# Patient Record
Sex: Male | Born: 1960 | Race: Black or African American | Hispanic: No | Marital: Married | Smoking: Never smoker
Health system: Southern US, Community
[De-identification: ages and names within clinical notes are randomized; demographics above are authoritative.]

## PROBLEM LIST (undated history)

## (undated) DIAGNOSIS — I10 Essential (primary) hypertension: Secondary | ICD-10-CM

## (undated) DIAGNOSIS — Z923 Personal history of irradiation: Secondary | ICD-10-CM

---

## 2003-01-19 ENCOUNTER — Encounter: Payer: Self-pay | Admitting: Family Medicine

## 2003-01-19 ENCOUNTER — Ambulatory Visit (HOSPITAL_COMMUNITY): Admission: RE | Admit: 2003-01-19 | Discharge: 2003-01-19 | Payer: Self-pay | Admitting: Family Medicine

## 2004-03-30 ENCOUNTER — Emergency Department (HOSPITAL_COMMUNITY): Admission: EM | Admit: 2004-03-30 | Discharge: 2004-03-30 | Payer: Self-pay | Admitting: Emergency Medicine

## 2004-03-30 ENCOUNTER — Ambulatory Visit (HOSPITAL_BASED_OUTPATIENT_CLINIC_OR_DEPARTMENT_OTHER): Admission: RE | Admit: 2004-03-30 | Discharge: 2004-03-30 | Payer: Self-pay | Admitting: Orthopedic Surgery

## 2004-03-30 ENCOUNTER — Ambulatory Visit (HOSPITAL_COMMUNITY): Admission: RE | Admit: 2004-03-30 | Discharge: 2004-03-30 | Payer: Self-pay | Admitting: Orthopedic Surgery

## 2007-05-08 ENCOUNTER — Inpatient Hospital Stay (HOSPITAL_COMMUNITY): Admission: EM | Admit: 2007-05-08 | Discharge: 2007-05-11 | Payer: Self-pay | Admitting: *Deleted

## 2007-05-08 ENCOUNTER — Ambulatory Visit: Payer: Self-pay | Admitting: Cardiology

## 2007-05-08 ENCOUNTER — Encounter (INDEPENDENT_AMBULATORY_CARE_PROVIDER_SITE_OTHER): Payer: Self-pay | Admitting: Internal Medicine

## 2007-05-12 ENCOUNTER — Ambulatory Visit: Payer: Self-pay | Admitting: Internal Medicine

## 2007-05-25 ENCOUNTER — Ambulatory Visit (HOSPITAL_COMMUNITY): Admission: RE | Admit: 2007-05-25 | Discharge: 2007-05-25 | Payer: Self-pay | Admitting: Internal Medicine

## 2007-05-25 LAB — CBC WITH DIFFERENTIAL/PLATELET
Eosinophils Absolute: 0 10*3/uL (ref 0.0–0.5)
HGB: 9.4 g/dL — ABNORMAL LOW (ref 13.0–17.1)
MCV: 89.9 fL (ref 81.6–98.0)
MONO#: 0.5 10*3/uL (ref 0.1–0.9)
MONO%: 13.6 % — ABNORMAL HIGH (ref 0.0–13.0)
NEUT#: 2.3 10*3/uL (ref 1.5–6.5)
RBC: 2.95 10*6/uL — ABNORMAL LOW (ref 4.20–5.71)
RDW: 15.5 % — ABNORMAL HIGH (ref 11.2–14.6)
WBC: 3.9 10*3/uL — ABNORMAL LOW (ref 4.0–10.0)
lymph#: 1.1 10*3/uL (ref 0.9–3.3)

## 2007-05-29 LAB — COMPREHENSIVE METABOLIC PANEL
Albumin: 2.9 g/dL — ABNORMAL LOW (ref 3.5–5.2)
Alkaline Phosphatase: 19 U/L — ABNORMAL LOW (ref 39–117)
Calcium: 9 mg/dL (ref 8.4–10.5)
Chloride: 103 mEq/L (ref 96–112)
Glucose, Bld: 80 mg/dL (ref 70–99)
Potassium: 3.7 mEq/L (ref 3.5–5.3)
Sodium: 132 mEq/L — ABNORMAL LOW (ref 135–145)
Total Protein: 13.6 g/dL — ABNORMAL HIGH (ref 6.0–8.3)

## 2007-05-29 LAB — UIFE/LIGHT CHAINS/TP QN, 24-HR UR
Albumin, U: DETECTED
Alpha 1, Urine: DETECTED — AB
Beta, Urine: DETECTED — AB
Free Lambda Lt Chains,Ur: 0.12 mg/dL (ref 0.08–1.01)
Gamma Globulin, Urine: DETECTED — AB
Volume, Urine: 1375 mL

## 2007-05-29 LAB — SPEP & IFE WITH QIG
Alpha-2-Globulin: 6 % — ABNORMAL LOW (ref 7.1–11.8)
Gamma Globulin: 53.9 % — ABNORMAL HIGH (ref 11.1–18.8)
IgG (Immunoglobin G), Serum: 5900 mg/dL — ABNORMAL HIGH (ref 694–1618)
M-Spike, %: 4.54 g/dL

## 2007-05-29 LAB — KAPPA/LAMBDA LIGHT CHAINS

## 2007-05-29 LAB — BETA 2 MICROGLOBULIN, SERUM: Beta-2 Microglobulin: 5.28 mg/L — ABNORMAL HIGH (ref 1.01–1.73)

## 2007-06-19 ENCOUNTER — Ambulatory Visit (HOSPITAL_COMMUNITY): Admission: RE | Admit: 2007-06-19 | Discharge: 2007-06-19 | Payer: Self-pay | Admitting: Internal Medicine

## 2007-06-19 ENCOUNTER — Encounter (INDEPENDENT_AMBULATORY_CARE_PROVIDER_SITE_OTHER): Payer: Self-pay | Admitting: Interventional Radiology

## 2007-06-21 ENCOUNTER — Emergency Department (HOSPITAL_COMMUNITY): Admission: EM | Admit: 2007-06-21 | Discharge: 2007-06-21 | Payer: Self-pay | Admitting: Emergency Medicine

## 2007-06-22 ENCOUNTER — Ambulatory Visit: Payer: Self-pay | Admitting: Internal Medicine

## 2007-06-24 ENCOUNTER — Encounter (HOSPITAL_COMMUNITY): Admission: RE | Admit: 2007-06-24 | Discharge: 2007-08-17 | Payer: Self-pay | Admitting: Internal Medicine

## 2007-06-24 LAB — CBC WITH DIFFERENTIAL/PLATELET
Basophils Absolute: 0 10*3/uL (ref 0.0–0.1)
Eosinophils Absolute: 0 10*3/uL (ref 0.0–0.5)
HCT: 21.6 % — ABNORMAL LOW (ref 38.7–49.9)
HGB: 7.7 g/dL — ABNORMAL LOW (ref 13.0–17.1)
LYMPH%: 20.9 % (ref 14.0–48.0)
MONO#: 0.9 10*3/uL (ref 0.1–0.9)
NEUT#: 5.5 10*3/uL (ref 1.5–6.5)
NEUT%: 67.1 % (ref 40.0–75.0)
Platelets: 193 10*3/uL (ref 145–400)
WBC: 8.2 10*3/uL (ref 4.0–10.0)

## 2007-06-24 LAB — COMPREHENSIVE METABOLIC PANEL
CO2: 21 mEq/L (ref 19–32)
Creatinine, Ser: 0.69 mg/dL (ref 0.40–1.50)
Glucose, Bld: 88 mg/dL (ref 70–99)
Total Bilirubin: 0.5 mg/dL (ref 0.3–1.2)
Total Protein: 12.2 g/dL — ABNORMAL HIGH (ref 6.0–8.3)

## 2007-06-26 LAB — TYPE & CROSSMATCH - CHCC

## 2007-06-30 ENCOUNTER — Ambulatory Visit: Admission: RE | Admit: 2007-06-30 | Discharge: 2007-08-13 | Payer: Self-pay | Admitting: Radiation Oncology

## 2007-07-01 LAB — BASIC METABOLIC PANEL
BUN: 23 mg/dL (ref 6–23)
CO2: 21 mEq/L (ref 19–32)
Chloride: 100 mEq/L (ref 96–112)
Creatinine, Ser: 1.1 mg/dL (ref 0.40–1.50)

## 2007-07-27 LAB — CBC WITH DIFFERENTIAL/PLATELET
Basophils Absolute: 0 10*3/uL (ref 0.0–0.1)
EOS%: 6 % (ref 0.0–7.0)
HGB: 8 g/dL — ABNORMAL LOW (ref 13.0–17.1)
MCH: 32.9 pg (ref 28.0–33.4)
MCV: 92.3 fL (ref 81.6–98.0)
MONO%: 12.5 % (ref 0.0–13.0)
RDW: 15.8 % — ABNORMAL HIGH (ref 11.2–14.6)

## 2007-07-27 LAB — COMPREHENSIVE METABOLIC PANEL
AST: 10 U/L (ref 0–37)
Albumin: 2.9 g/dL — ABNORMAL LOW (ref 3.5–5.2)
Alkaline Phosphatase: 33 U/L — ABNORMAL LOW (ref 39–117)
BUN: 16 mg/dL (ref 6–23)
Creatinine, Ser: 0.69 mg/dL (ref 0.40–1.50)
Potassium: 2.8 mEq/L — ABNORMAL LOW (ref 3.5–5.3)
Total Bilirubin: 0.5 mg/dL (ref 0.3–1.2)

## 2007-08-10 ENCOUNTER — Ambulatory Visit: Payer: Self-pay | Admitting: Internal Medicine

## 2007-08-12 LAB — CBC WITH DIFFERENTIAL/PLATELET
BASO%: 0.3 % (ref 0.0–2.0)
LYMPH%: 15.1 % (ref 14.0–48.0)
MCH: 32.7 pg (ref 28.0–33.4)
MCHC: 35.9 g/dL (ref 32.0–35.9)
MCV: 91.1 fL (ref 81.6–98.0)
MONO%: 9.3 % (ref 0.0–13.0)
Platelets: 185 10*3/uL (ref 145–400)
RBC: 3.06 10*6/uL — ABNORMAL LOW (ref 4.20–5.71)

## 2007-08-12 LAB — COMPREHENSIVE METABOLIC PANEL
ALT: 9 U/L (ref 0–53)
Alkaline Phosphatase: 44 U/L (ref 39–117)
Sodium: 134 mEq/L — ABNORMAL LOW (ref 135–145)
Total Bilirubin: 0.5 mg/dL (ref 0.3–1.2)
Total Protein: 9.3 g/dL — ABNORMAL HIGH (ref 6.0–8.3)

## 2007-08-19 LAB — COMPREHENSIVE METABOLIC PANEL
ALT: 9 U/L (ref 0–53)
BUN: 9 mg/dL (ref 6–23)
CO2: 26 mEq/L (ref 19–32)
Calcium: 8.5 mg/dL (ref 8.4–10.5)
Creatinine, Ser: 0.72 mg/dL (ref 0.40–1.50)
Glucose, Bld: 76 mg/dL (ref 70–99)
Total Bilirubin: 0.8 mg/dL (ref 0.3–1.2)

## 2007-08-19 LAB — CBC WITH DIFFERENTIAL/PLATELET
BASO%: 0.1 % (ref 0.0–2.0)
Basophils Absolute: 0 10*3/uL (ref 0.0–0.1)
HCT: 27 % — ABNORMAL LOW (ref 38.7–49.9)
HGB: 9.8 g/dL — ABNORMAL LOW (ref 13.0–17.1)
LYMPH%: 12 % — ABNORMAL LOW (ref 14.0–48.0)
MCH: 33.6 pg — ABNORMAL HIGH (ref 28.0–33.4)
MCHC: 36.4 g/dL — ABNORMAL HIGH (ref 32.0–35.9)
MONO#: 0.5 10*3/uL (ref 0.1–0.9)
NEUT%: 65.5 % (ref 40.0–75.0)
Platelets: 218 10*3/uL (ref 145–400)
WBC: 3.5 10*3/uL — ABNORMAL LOW (ref 4.0–10.0)

## 2007-08-26 LAB — CBC WITH DIFFERENTIAL/PLATELET
BASO%: 2.7 % — ABNORMAL HIGH (ref 0.0–2.0)
EOS%: 6.9 % (ref 0.0–7.0)
Eosinophils Absolute: 0.2 10*3/uL (ref 0.0–0.5)
MCH: 32.7 pg (ref 28.0–33.4)
MCHC: 35.2 g/dL (ref 32.0–35.9)
MCV: 92.9 fL (ref 81.6–98.0)
MONO%: 23.8 % — ABNORMAL HIGH (ref 0.0–13.0)
NEUT#: 1.2 10*3/uL — ABNORMAL LOW (ref 1.5–6.5)
RBC: 3.31 10*6/uL — ABNORMAL LOW (ref 4.20–5.71)
RDW: 13.7 % (ref 11.2–14.6)

## 2007-08-26 LAB — COMPREHENSIVE METABOLIC PANEL
AST: 9 U/L (ref 0–37)
Albumin: 3.7 g/dL (ref 3.5–5.2)
BUN: 12 mg/dL (ref 6–23)
Calcium: 8.6 mg/dL (ref 8.4–10.5)
Chloride: 101 mEq/L (ref 96–112)
Potassium: 3.6 mEq/L (ref 3.5–5.3)

## 2007-09-09 LAB — CBC WITH DIFFERENTIAL/PLATELET
Basophils Absolute: 0 10*3/uL (ref 0.0–0.1)
Eosinophils Absolute: 0 10*3/uL (ref 0.0–0.5)
HGB: 10.9 g/dL — ABNORMAL LOW (ref 13.0–17.1)
LYMPH%: 19.6 % (ref 14.0–48.0)
MONO#: 0.7 10*3/uL (ref 0.1–0.9)
NEUT#: 2.3 10*3/uL (ref 1.5–6.5)
Platelets: 279 10*3/uL (ref 145–400)
RBC: 3.27 10*6/uL — ABNORMAL LOW (ref 4.20–5.71)
WBC: 3.8 10*3/uL — ABNORMAL LOW (ref 4.0–10.0)

## 2007-09-09 LAB — COMPREHENSIVE METABOLIC PANEL
ALT: 11 U/L (ref 0–53)
CO2: 26 mEq/L (ref 19–32)
Calcium: 9.2 mg/dL (ref 8.4–10.5)
Chloride: 104 mEq/L (ref 96–112)
Glucose, Bld: 87 mg/dL (ref 70–99)
Sodium: 136 mEq/L (ref 135–145)
Total Protein: 9.4 g/dL — ABNORMAL HIGH (ref 6.0–8.3)

## 2007-09-09 LAB — LACTATE DEHYDROGENASE: LDH: 110 U/L (ref 94–250)

## 2007-09-17 LAB — CBC WITH DIFFERENTIAL/PLATELET
BASO%: 0 % (ref 0.0–2.0)
Eosinophils Absolute: 0 10*3/uL (ref 0.0–0.5)
MONO#: 0.6 10*3/uL (ref 0.1–0.9)
NEUT#: 4.8 10*3/uL (ref 1.5–6.5)
RBC: 3.45 10*6/uL — ABNORMAL LOW (ref 4.20–5.71)
RDW: 16.1 % — ABNORMAL HIGH (ref 11.2–14.6)
WBC: 6.2 10*3/uL (ref 4.0–10.0)
lymph#: 0.8 10*3/uL — ABNORMAL LOW (ref 0.9–3.3)

## 2007-09-21 LAB — COMPREHENSIVE METABOLIC PANEL
Albumin: 3.6 g/dL (ref 3.5–5.2)
Alkaline Phosphatase: 50 U/L (ref 39–117)
BUN: 14 mg/dL (ref 6–23)
CO2: 25 mEq/L (ref 19–32)
Calcium: 8.9 mg/dL (ref 8.4–10.5)
Chloride: 103 mEq/L (ref 96–112)
Glucose, Bld: 77 mg/dL (ref 70–99)
Potassium: 3.4 mEq/L — ABNORMAL LOW (ref 3.5–5.3)
Sodium: 138 mEq/L (ref 135–145)
Total Protein: 9.3 g/dL — ABNORMAL HIGH (ref 6.0–8.3)

## 2007-09-21 LAB — LACTATE DEHYDROGENASE: LDH: 108 U/L (ref 94–250)

## 2007-09-21 LAB — IGG, IGA, IGM: IgG (Immunoglobin G), Serum: 5020 mg/dL — ABNORMAL HIGH (ref 694–1618)

## 2007-09-21 LAB — KAPPA/LAMBDA LIGHT CHAINS: Kappa free light chain: 5.42 mg/dL — ABNORMAL HIGH (ref 0.33–1.94)

## 2007-09-24 LAB — COMPREHENSIVE METABOLIC PANEL
ALT: 11 U/L (ref 0–53)
AST: 11 U/L (ref 0–37)
Albumin: 3.8 g/dL (ref 3.5–5.2)
Calcium: 9.5 mg/dL (ref 8.4–10.5)
Chloride: 104 mEq/L (ref 96–112)
Creatinine, Ser: 0.54 mg/dL (ref 0.40–1.50)
Potassium: 4 mEq/L (ref 3.5–5.3)
Sodium: 134 mEq/L — ABNORMAL LOW (ref 135–145)

## 2007-09-24 LAB — CBC WITH DIFFERENTIAL/PLATELET
BASO%: 0.3 % (ref 0.0–2.0)
EOS%: 0 % (ref 0.0–7.0)
MCH: 33.5 pg — ABNORMAL HIGH (ref 28.0–33.4)
MCHC: 36.7 g/dL — ABNORMAL HIGH (ref 32.0–35.9)
RBC: 3.65 10*6/uL — ABNORMAL LOW (ref 4.20–5.71)
RDW: 12.8 % (ref 11.2–14.6)
lymph#: 0.5 10*3/uL — ABNORMAL LOW (ref 0.9–3.3)

## 2007-09-25 LAB — IGG, IGA, IGM
IgA: 40 mg/dL — ABNORMAL LOW (ref 68–378)
IgM, Serum: 20 mg/dL — ABNORMAL LOW (ref 60–263)

## 2007-09-25 LAB — KAPPA/LAMBDA LIGHT CHAINS
Kappa free light chain: 11.7 mg/dL — ABNORMAL HIGH (ref 0.33–1.94)
Lambda Free Lght Chn: <0.8 mg/dL (ref 0.57–2.63)

## 2007-09-25 LAB — BETA 2 MICROGLOBULIN, SERUM: Beta-2 Microglobulin: 2.1 mg/L — ABNORMAL HIGH (ref 1.01–1.73)

## 2007-10-19 ENCOUNTER — Ambulatory Visit: Payer: Self-pay | Admitting: Internal Medicine

## 2007-10-21 LAB — CBC WITH DIFFERENTIAL/PLATELET
BASO%: 0.2 % (ref 0.0–2.0)
Basophils Absolute: 0 10*3/uL (ref 0.0–0.1)
EOS%: 0.1 % (ref 0.0–7.0)
MCH: 32.8 pg (ref 28.0–33.4)
MCHC: 35 g/dL (ref 32.0–35.9)
MCV: 93.7 fL (ref 81.6–98.0)
MONO%: 10.1 % (ref 0.0–13.0)
NEUT%: 82.4 % — ABNORMAL HIGH (ref 40.0–75.0)
RDW: 15.2 % — ABNORMAL HIGH (ref 11.2–14.6)
lymph#: 0.5 10*3/uL — ABNORMAL LOW (ref 0.9–3.3)

## 2007-10-21 LAB — COMPREHENSIVE METABOLIC PANEL
ALT: 15 U/L (ref 0–53)
AST: 8 U/L (ref 0–37)
Alkaline Phosphatase: 53 U/L (ref 39–117)
BUN: 19 mg/dL (ref 6–23)
Calcium: 9.5 mg/dL (ref 8.4–10.5)
Chloride: 104 mEq/L (ref 96–112)
Creatinine, Ser: 0.63 mg/dL (ref 0.40–1.50)
Potassium: 3.9 mEq/L (ref 3.5–5.3)

## 2007-11-06 ENCOUNTER — Encounter (INDEPENDENT_AMBULATORY_CARE_PROVIDER_SITE_OTHER): Payer: Self-pay | Admitting: Interventional Radiology

## 2007-11-06 ENCOUNTER — Ambulatory Visit (HOSPITAL_COMMUNITY): Admission: RE | Admit: 2007-11-06 | Discharge: 2007-11-06 | Payer: Self-pay | Admitting: Internal Medicine

## 2007-11-09 LAB — CBC WITH DIFFERENTIAL/PLATELET
BASO%: 0.2 % (ref 0.0–2.0)
Basophils Absolute: 0 10*3/uL (ref 0.0–0.1)
EOS%: 0.1 % (ref 0.0–7.0)
HGB: 13.7 g/dL (ref 13.0–17.1)
MCH: 32.6 pg (ref 28.0–33.4)
MCHC: 35.5 g/dL (ref 32.0–35.9)
MCV: 91.8 fL (ref 81.6–98.0)
MONO%: 18.3 % — ABNORMAL HIGH (ref 0.0–13.0)
NEUT%: 64 % (ref 40.0–75.0)
RDW: 14.3 % (ref 11.2–14.6)

## 2007-11-10 LAB — IGG, IGA, IGM
IgA: 45 mg/dL — ABNORMAL LOW (ref 68–378)
IgM, Serum: 34 mg/dL — ABNORMAL LOW (ref 60–263)

## 2007-11-10 LAB — COMPREHENSIVE METABOLIC PANEL
AST: 10 U/L (ref 0–37)
Alkaline Phosphatase: 46 U/L (ref 39–117)
BUN: 13 mg/dL (ref 6–23)
Creatinine, Ser: 0.64 mg/dL (ref 0.40–1.50)
Potassium: 3.8 mEq/L (ref 3.5–5.3)

## 2007-11-10 LAB — KAPPA/LAMBDA LIGHT CHAINS
Kappa free light chain: 4.38 mg/dL — ABNORMAL HIGH (ref 0.33–1.94)
Lambda Free Lght Chn: 0.83 mg/dL (ref 0.57–2.63)

## 2007-11-18 LAB — BASIC METABOLIC PANEL
BUN: 12 mg/dL (ref 6–23)
Calcium: 8.8 mg/dL (ref 8.4–10.5)
Creatinine, Ser: 0.6 mg/dL (ref 0.40–1.50)

## 2007-12-14 ENCOUNTER — Ambulatory Visit: Payer: Self-pay | Admitting: Internal Medicine

## 2007-12-16 LAB — COMPREHENSIVE METABOLIC PANEL
Albumin: 3.8 g/dL (ref 3.5–5.2)
Alkaline Phosphatase: 28 U/L — ABNORMAL LOW (ref 39–117)
Glucose, Bld: 87 mg/dL (ref 70–99)
Potassium: 3.8 mEq/L (ref 3.5–5.3)
Sodium: 136 mEq/L (ref 135–145)
Total Protein: 8.4 g/dL — ABNORMAL HIGH (ref 6.0–8.3)

## 2007-12-16 LAB — CBC WITH DIFFERENTIAL/PLATELET
Eosinophils Absolute: 0.1 10*3/uL (ref 0.0–0.5)
MONO#: 0.9 10*3/uL (ref 0.1–0.9)
MONO%: 24.3 % — ABNORMAL HIGH (ref 0.0–13.0)
NEUT#: 1.6 10*3/uL (ref 1.5–6.5)
RBC: 4.06 10*6/uL — ABNORMAL LOW (ref 4.20–5.71)
RDW: 11.6 % (ref 11.2–14.6)
WBC: 3.6 10*3/uL — ABNORMAL LOW (ref 4.0–10.0)

## 2008-01-18 LAB — CBC WITH DIFFERENTIAL/PLATELET
BASO%: 0 % (ref 0.0–2.0)
LYMPH%: 5.4 % — ABNORMAL LOW (ref 14.0–48.0)
MCHC: 35.2 g/dL (ref 32.0–35.9)
MCV: 88.8 fL (ref 81.6–98.0)
MONO%: 0.8 % (ref 0.0–13.0)
Platelets: 152 10*3/uL (ref 145–400)
RBC: 3.97 10*6/uL — ABNORMAL LOW (ref 4.20–5.71)
WBC: 11.2 10*3/uL — ABNORMAL HIGH (ref 4.0–10.0)

## 2008-01-20 LAB — CBC WITH DIFFERENTIAL/PLATELET
Basophils Absolute: 0 10*3/uL (ref 0.0–0.1)
Eosinophils Absolute: 0 10*3/uL (ref 0.0–0.5)
HCT: 33.4 % — ABNORMAL LOW (ref 38.7–49.9)
HGB: 11.7 g/dL — ABNORMAL LOW (ref 13.0–17.1)
LYMPH%: 15.5 % (ref 14.0–48.0)
MCV: 87.9 fL (ref 81.6–98.0)
MONO#: 0.1 10*3/uL (ref 0.1–0.9)
MONO%: 2.4 % (ref 0.0–13.0)
NEUT%: 81.2 % — ABNORMAL HIGH (ref 40.0–75.0)
Platelets: 114 10*3/uL — ABNORMAL LOW (ref 145–400)

## 2008-01-26 LAB — LIPID PANEL
LDL Cholesterol: 39 mg/dL (ref 0–99)
Total CHOL/HDL Ratio: 4.6 Ratio
VLDL: 22 mg/dL (ref 0–40)

## 2008-02-25 ENCOUNTER — Ambulatory Visit: Payer: Self-pay | Admitting: Internal Medicine

## 2008-03-01 LAB — CBC WITH DIFFERENTIAL/PLATELET
BASO%: 2.1 % — ABNORMAL HIGH (ref 0.0–2.0)
LYMPH%: 15 % (ref 14.0–48.0)
MCHC: 34.8 g/dL (ref 32.0–35.9)
MONO#: 1.3 10*3/uL — ABNORMAL HIGH (ref 0.1–0.9)
NEUT#: 2.9 10*3/uL (ref 1.5–6.5)
Platelets: 171 10*3/uL (ref 145–400)
RBC: 3.87 10*6/uL — ABNORMAL LOW (ref 4.20–5.71)
RDW: 15.6 % — ABNORMAL HIGH (ref 11.2–14.6)
WBC: 5.2 10*3/uL (ref 4.0–10.0)

## 2008-03-01 LAB — COMPREHENSIVE METABOLIC PANEL
ALT: 24 U/L (ref 0–53)
Albumin: 3.9 g/dL (ref 3.5–5.2)
Alkaline Phosphatase: 58 U/L (ref 39–117)
CO2: 23 mEq/L (ref 19–32)
Potassium: 4.1 mEq/L (ref 3.5–5.3)
Sodium: 139 mEq/L (ref 135–145)
Total Bilirubin: 1 mg/dL (ref 0.3–1.2)
Total Protein: 6.5 g/dL (ref 6.0–8.3)

## 2008-05-19 ENCOUNTER — Ambulatory Visit: Payer: Self-pay | Admitting: Internal Medicine

## 2008-05-24 LAB — CBC WITH DIFFERENTIAL/PLATELET
BASO%: 0.4 % (ref 0.0–2.0)
Basophils Absolute: 0 10*3/uL (ref 0.0–0.1)
EOS%: 3.6 % (ref 0.0–7.0)
HGB: 14.6 g/dL (ref 13.0–17.1)
MCH: 30.4 pg (ref 28.0–33.4)
MCHC: 35.2 g/dL (ref 32.0–35.9)
MCV: 86.2 fL (ref 81.6–98.0)
MONO%: 17.3 % — ABNORMAL HIGH (ref 0.0–13.0)
RDW: 13.1 % (ref 11.2–14.6)

## 2008-05-26 LAB — KAPPA/LAMBDA LIGHT CHAINS
Kappa free light chain: <0.6 mg/dL (ref 0.33–1.94)
Lambda Free Lght Chn: 0.62 mg/dL (ref 0.57–2.63)

## 2008-05-26 LAB — COMPREHENSIVE METABOLIC PANEL
AST: 19 U/L (ref 0–37)
Alkaline Phosphatase: 44 U/L (ref 39–117)
BUN: 15 mg/dL (ref 6–23)
Creatinine, Ser: 0.64 mg/dL (ref 0.40–1.50)
Potassium: 4.1 mEq/L (ref 3.5–5.3)

## 2008-05-26 LAB — BETA 2 MICROGLOBULIN, SERUM: Beta-2 Microglobulin: 1.52 mg/L (ref 1.01–1.73)

## 2008-05-26 LAB — IMMUNOFIXATION ELECTROPHORESIS: IgA: 118 mg/dL (ref 68–378)

## 2008-07-26 ENCOUNTER — Ambulatory Visit: Payer: Self-pay | Admitting: Internal Medicine

## 2008-07-29 LAB — BASIC METABOLIC PANEL
BUN: 13 mg/dL (ref 6–23)
CO2: 18 mEq/L — ABNORMAL LOW (ref 19–32)
Chloride: 100 mEq/L (ref 96–112)
Creatinine, Ser: 0.66 mg/dL (ref 0.40–1.50)
Glucose, Bld: 99 mg/dL (ref 70–99)

## 2008-08-26 LAB — CBC WITH DIFFERENTIAL/PLATELET
BASO%: 0.4 % (ref 0.0–2.0)
Basophils Absolute: 0 10*3/uL (ref 0.0–0.1)
EOS%: 2.1 % (ref 0.0–7.0)
HGB: 13.4 g/dL (ref 13.0–17.1)
MCH: 31.5 pg (ref 28.0–33.4)
MCHC: 35.2 g/dL (ref 32.0–35.9)
RDW: 13.5 % (ref 11.2–14.6)
WBC: 5 10*3/uL (ref 4.0–10.0)
lymph#: 1.3 10*3/uL (ref 0.9–3.3)

## 2008-08-29 LAB — COMPREHENSIVE METABOLIC PANEL
ALT: 32 U/L (ref 0–53)
AST: 22 U/L (ref 0–37)
Albumin: 4.4 g/dL (ref 3.5–5.2)
Calcium: 8.8 mg/dL (ref 8.4–10.5)
Chloride: 105 mEq/L (ref 96–112)
Potassium: 4 mEq/L (ref 3.5–5.3)

## 2008-08-29 LAB — KAPPA/LAMBDA LIGHT CHAINS: Kappa:Lambda Ratio: 0.97 (ref 0.26–1.65)

## 2008-10-17 ENCOUNTER — Ambulatory Visit: Payer: Self-pay | Admitting: Internal Medicine

## 2008-11-22 LAB — CBC WITH DIFFERENTIAL/PLATELET
Basophils Absolute: 0 10*3/uL (ref 0.0–0.1)
EOS%: 1.1 % (ref 0.0–7.0)
Eosinophils Absolute: 0.1 10*3/uL (ref 0.0–0.5)
HGB: 15.2 g/dL (ref 13.0–17.1)
LYMPH%: 22.5 % (ref 14.0–48.0)
MCH: 31.4 pg (ref 28.0–33.4)
MCV: 89.8 fL (ref 81.6–98.0)
MONO%: 11.6 % (ref 0.0–13.0)
NEUT#: 3.2 10*3/uL (ref 1.5–6.5)
NEUT%: 64.4 % (ref 40.0–75.0)
Platelets: 200 10*3/uL (ref 145–400)

## 2008-11-24 LAB — IMMUNOFIXATION ELECTROPHORESIS
IgA: 143 mg/dL (ref 68–378)
IgG (Immunoglobin G), Serum: 1150 mg/dL (ref 694–1618)
Total Protein, Serum Electrophoresis: 7.5 g/dL (ref 6.0–8.3)

## 2008-11-24 LAB — COMPREHENSIVE METABOLIC PANEL
Albumin: 4.5 g/dL (ref 3.5–5.2)
Alkaline Phosphatase: 29 U/L — ABNORMAL LOW (ref 39–117)
BUN: 13 mg/dL (ref 6–23)
Creatinine, Ser: 0.75 mg/dL (ref 0.40–1.50)
Glucose, Bld: 94 mg/dL (ref 70–99)
Potassium: 3.9 mEq/L (ref 3.5–5.3)
Total Bilirubin: 1.1 mg/dL (ref 0.3–1.2)

## 2008-11-24 LAB — KAPPA/LAMBDA LIGHT CHAINS
Kappa:Lambda Ratio: 1.09 (ref 0.26–1.65)
Lambda Free Lght Chn: 0.9 mg/dL (ref 0.57–2.63)

## 2008-12-15 ENCOUNTER — Ambulatory Visit: Payer: Self-pay | Admitting: Internal Medicine

## 2008-12-19 LAB — CBC WITH DIFFERENTIAL/PLATELET
Basophils Absolute: 0 10*3/uL (ref 0.0–0.1)
Eosinophils Absolute: 0.1 10*3/uL (ref 0.0–0.5)
HCT: 40.1 % (ref 38.7–49.9)
HGB: 14 g/dL (ref 13.0–17.1)
MCH: 31.3 pg (ref 28.0–33.4)
MCV: 89.7 fL (ref 81.6–98.0)
NEUT#: 2.8 10*3/uL (ref 1.5–6.5)
NEUT%: 60.8 % (ref 40.0–75.0)
RDW: 13.4 % (ref 11.2–14.6)
lymph#: 1 10*3/uL (ref 0.9–3.3)

## 2008-12-21 LAB — IMMUNOFIXATION ELECTROPHORESIS
IgA: 132 mg/dL (ref 68–378)
IgM, Serum: 46 mg/dL — ABNORMAL LOW (ref 60–263)

## 2008-12-21 LAB — KAPPA/LAMBDA LIGHT CHAINS

## 2009-02-14 ENCOUNTER — Ambulatory Visit: Payer: Self-pay | Admitting: Internal Medicine

## 2009-02-17 LAB — BASIC METABOLIC PANEL
CO2: 21 mEq/L (ref 19–32)
Chloride: 105 mEq/L (ref 96–112)
Creatinine, Ser: 0.6 mg/dL (ref 0.40–1.50)
Sodium: 136 mEq/L (ref 135–145)

## 2009-03-17 LAB — CBC WITH DIFFERENTIAL/PLATELET
Basophils Absolute: 0 10*3/uL (ref 0.0–0.1)
EOS%: 2.5 % (ref 0.0–7.0)
HCT: 40 % (ref 38.4–49.9)
HGB: 14 g/dL (ref 13.0–17.1)
LYMPH%: 32.6 % (ref 14.0–49.0)
MCH: 31.3 pg (ref 27.2–33.4)
MCV: 89.3 fL (ref 79.3–98.0)
MONO%: 14.1 % — ABNORMAL HIGH (ref 0.0–14.0)
NEUT%: 50.6 % (ref 39.0–75.0)
Platelets: 191 10*3/uL (ref 140–400)

## 2009-03-20 LAB — KAPPA/LAMBDA LIGHT CHAINS: Kappa:Lambda Ratio: 1.57 (ref 0.26–1.65)

## 2009-03-20 LAB — IGG, IGA, IGM
IgA: 136 mg/dL (ref 68–378)
IgM, Serum: 44 mg/dL — ABNORMAL LOW (ref 60–263)

## 2009-03-20 LAB — COMPREHENSIVE METABOLIC PANEL
AST: 18 U/L (ref 0–37)
Alkaline Phosphatase: 34 U/L — ABNORMAL LOW (ref 39–117)
BUN: 19 mg/dL (ref 6–23)
Creatinine, Ser: 0.66 mg/dL (ref 0.40–1.50)

## 2009-03-20 LAB — BETA 2 MICROGLOBULIN, SERUM: Beta-2 Microglobulin: 1.44 mg/L (ref 1.01–1.73)

## 2009-04-12 ENCOUNTER — Ambulatory Visit: Payer: Self-pay | Admitting: Internal Medicine

## 2009-04-14 LAB — BASIC METABOLIC PANEL
CO2: 23 mEq/L (ref 19–32)
Calcium: 9.5 mg/dL (ref 8.4–10.5)
Sodium: 133 mEq/L — ABNORMAL LOW (ref 135–145)

## 2009-06-13 ENCOUNTER — Ambulatory Visit: Payer: Self-pay | Admitting: Internal Medicine

## 2009-06-16 LAB — BASIC METABOLIC PANEL
Chloride: 101 mEq/L (ref 96–112)
Glucose, Bld: 103 mg/dL — ABNORMAL HIGH (ref 70–99)
Potassium: 4.2 mEq/L (ref 3.5–5.3)
Sodium: 131 mEq/L — ABNORMAL LOW (ref 135–145)

## 2009-08-07 ENCOUNTER — Ambulatory Visit: Payer: Self-pay | Admitting: Internal Medicine

## 2009-08-09 LAB — BASIC METABOLIC PANEL
BUN: 14 mg/dL (ref 6–23)
Potassium: 4 mEq/L (ref 3.5–5.3)

## 2009-09-14 ENCOUNTER — Ambulatory Visit: Payer: Self-pay | Admitting: Internal Medicine

## 2009-09-18 LAB — COMPREHENSIVE METABOLIC PANEL
ALT: 29 U/L (ref 0–53)
BUN: 11 mg/dL (ref 6–23)
CO2: 25 mEq/L (ref 19–32)
Calcium: 9.6 mg/dL (ref 8.4–10.5)
Chloride: 108 mEq/L (ref 96–112)
Creatinine, Ser: 0.7 mg/dL (ref 0.40–1.50)
Glucose, Bld: 110 mg/dL — ABNORMAL HIGH (ref 70–99)
Total Bilirubin: 0.6 mg/dL (ref 0.3–1.2)

## 2009-09-18 LAB — CBC WITH DIFFERENTIAL/PLATELET
BASO%: 0.2 % (ref 0.0–2.0)
Basophils Absolute: 0 10*3/uL (ref 0.0–0.1)
HCT: 38.8 % (ref 38.4–49.9)
HGB: 13.4 g/dL (ref 13.0–17.1)
LYMPH%: 30.7 % (ref 14.0–49.0)
MCHC: 34.5 g/dL (ref 32.0–36.0)
MONO#: 0.5 10*3/uL (ref 0.1–0.9)
NEUT%: 57.6 % (ref 39.0–75.0)
Platelets: 188 10*3/uL (ref 140–400)
WBC: 5.2 10*3/uL (ref 4.0–10.3)

## 2009-09-18 LAB — LACTATE DEHYDROGENASE: LDH: 159 U/L (ref 94–250)

## 2009-09-19 LAB — BETA 2 MICROGLOBULIN, SERUM: Beta-2 Microglobulin: 1.97 mg/L — ABNORMAL HIGH (ref 1.01–1.73)

## 2009-09-19 LAB — KAPPA/LAMBDA LIGHT CHAINS
Kappa free light chain: 7.19 mg/dL — ABNORMAL HIGH (ref 0.33–1.94)
Lambda Free Lght Chn: 0.71 mg/dL (ref 0.57–2.63)

## 2009-09-19 LAB — IGG, IGA, IGM
IgA: 89 mg/dL (ref 68–378)
IgM, Serum: 40 mg/dL — ABNORMAL LOW (ref 60–263)

## 2009-10-05 LAB — BASIC METABOLIC PANEL
BUN: 10 mg/dL (ref 6–23)
CO2: 23 mEq/L (ref 19–32)
Calcium: 9.3 mg/dL (ref 8.4–10.5)
Chloride: 100 mEq/L (ref 96–112)
Creatinine, Ser: 0.72 mg/dL (ref 0.40–1.50)
Glucose, Bld: 106 mg/dL — ABNORMAL HIGH (ref 70–99)
Potassium: 3.8 mEq/L (ref 3.5–5.3)
Sodium: 130 mEq/L — ABNORMAL LOW (ref 135–145)

## 2009-10-09 ENCOUNTER — Ambulatory Visit (HOSPITAL_COMMUNITY): Admission: RE | Admit: 2009-10-09 | Discharge: 2009-10-09 | Payer: Self-pay | Admitting: Internal Medicine

## 2009-10-23 ENCOUNTER — Ambulatory Visit: Payer: Self-pay | Admitting: Internal Medicine

## 2009-10-23 LAB — CBC WITH DIFFERENTIAL/PLATELET
Eosinophils Absolute: 0.1 10*3/uL (ref 0.0–0.5)
HCT: 35.6 % — ABNORMAL LOW (ref 38.4–49.9)
LYMPH%: 25.8 % (ref 14.0–49.0)
MONO#: 0.4 10*3/uL (ref 0.1–0.9)
NEUT#: 4.3 10*3/uL (ref 1.5–6.5)
NEUT%: 65.6 % (ref 39.0–75.0)
Platelets: 171 10*3/uL (ref 140–400)
RBC: 3.86 10*6/uL — ABNORMAL LOW (ref 4.20–5.82)
WBC: 6.5 10*3/uL (ref 4.0–10.3)
lymph#: 1.7 10*3/uL (ref 0.9–3.3)

## 2009-10-30 ENCOUNTER — Inpatient Hospital Stay (HOSPITAL_COMMUNITY): Admission: EM | Admit: 2009-10-30 | Discharge: 2009-11-02 | Payer: Self-pay | Admitting: Emergency Medicine

## 2009-10-31 ENCOUNTER — Ambulatory Visit: Payer: Self-pay | Admitting: Internal Medicine

## 2009-11-15 ENCOUNTER — Emergency Department (HOSPITAL_COMMUNITY): Admission: EM | Admit: 2009-11-15 | Discharge: 2009-11-15 | Payer: Self-pay | Admitting: Emergency Medicine

## 2009-11-22 ENCOUNTER — Emergency Department (HOSPITAL_COMMUNITY): Admission: EM | Admit: 2009-11-22 | Discharge: 2009-11-22 | Payer: Self-pay | Admitting: Emergency Medicine

## 2009-11-24 ENCOUNTER — Ambulatory Visit: Payer: Self-pay | Admitting: Internal Medicine

## 2009-11-24 LAB — CBC WITH DIFFERENTIAL/PLATELET
EOS%: 2 % (ref 0.0–7.0)
HCT: 29.9 % — ABNORMAL LOW (ref 38.4–49.9)
HGB: 10.6 g/dL — ABNORMAL LOW (ref 13.0–17.1)
LYMPH%: 34.8 % (ref 14.0–49.0)
MCV: 93.9 fL (ref 79.3–98.0)
MONO#: 0.5 10*3/uL (ref 0.1–0.9)
MONO%: 10.5 % (ref 0.0–14.0)
NEUT#: 2.3 10*3/uL (ref 1.5–6.5)
NEUT%: 52.5 % (ref 39.0–75.0)
lymph#: 1.6 10*3/uL (ref 0.9–3.3)

## 2009-11-27 LAB — IGG, IGA, IGM
IgA: 40 mg/dL — ABNORMAL LOW (ref 68–378)
IgG (Immunoglobin G), Serum: 6550 mg/dL — ABNORMAL HIGH (ref 694–1618)
IgM, Serum: 22 mg/dL — ABNORMAL LOW (ref 60–263)

## 2009-11-27 LAB — COMPREHENSIVE METABOLIC PANEL
AST: 14 U/L (ref 0–37)
Alkaline Phosphatase: 24 U/L — ABNORMAL LOW (ref 39–117)
Glucose, Bld: 106 mg/dL — ABNORMAL HIGH (ref 70–99)
Total Bilirubin: 0.6 mg/dL (ref 0.3–1.2)

## 2009-12-07 LAB — LACTATE DEHYDROGENASE: LDH: 185 U/L (ref 94–250)

## 2009-12-07 LAB — TECHNOLOGIST REVIEW

## 2009-12-07 LAB — CBC WITH DIFFERENTIAL/PLATELET
BASO%: 0.8 % (ref 0.0–2.0)
EOS%: 3.1 % (ref 0.0–7.0)
HCT: 28.7 % — ABNORMAL LOW (ref 38.4–49.9)
LYMPH%: 36.4 % (ref 14.0–49.0)
MCH: 31.9 pg (ref 27.2–33.4)
MCHC: 33.8 g/dL (ref 32.0–36.0)
MCV: 94.4 fL (ref 79.3–98.0)
MONO%: 10.6 % (ref 0.0–14.0)
Platelets: 148 10*3/uL (ref 140–400)
RBC: 3.04 10*6/uL — ABNORMAL LOW (ref 4.20–5.82)

## 2009-12-07 LAB — COMPREHENSIVE METABOLIC PANEL
Albumin: 3.5 g/dL (ref 3.5–5.2)
Alkaline Phosphatase: 24 U/L — ABNORMAL LOW (ref 39–117)
Chloride: 99 mEq/L (ref 96–112)
Creatinine, Ser: 0.57 mg/dL (ref 0.40–1.50)
Glucose, Bld: 94 mg/dL (ref 70–99)
Total Protein: 10.2 g/dL — ABNORMAL HIGH (ref 6.0–8.3)

## 2009-12-26 ENCOUNTER — Ambulatory Visit: Payer: Self-pay | Admitting: Internal Medicine

## 2009-12-28 LAB — CBC WITH DIFFERENTIAL/PLATELET
BASO%: 0.9 % (ref 0.0–2.0)
Basophils Absolute: 0.1 10e3/uL (ref 0.0–0.1)
EOS%: 2.1 % (ref 0.0–7.0)
Eosinophils Absolute: 0.1 10e3/uL (ref 0.0–0.5)
HCT: 34.4 % — ABNORMAL LOW (ref 38.4–49.9)
HGB: 11.9 g/dL — ABNORMAL LOW (ref 13.0–17.1)
LYMPH%: 31.9 % (ref 14.0–49.0)
MCH: 32 pg (ref 27.2–33.4)
MCHC: 34.6 g/dL (ref 32.0–36.0)
MCV: 92.5 fL (ref 79.3–98.0)
MONO#: 0.9 10e3/uL (ref 0.1–0.9)
MONO%: 16.9 % — ABNORMAL HIGH (ref 0.0–14.0)
NEUT#: 2.6 10e3/uL (ref 1.5–6.5)
NEUT%: 48.2 % (ref 39.0–75.0)
Platelets: 230 10e3/uL (ref 140–400)
RBC: 3.72 10e6/uL — ABNORMAL LOW (ref 4.20–5.82)
RDW: 14.9 % — ABNORMAL HIGH (ref 11.0–14.6)
WBC: 5.3 10e3/uL (ref 4.0–10.3)
lymph#: 1.7 10e3/uL (ref 0.9–3.3)
nRBC: 0 % (ref 0–0)

## 2009-12-28 LAB — COMPREHENSIVE METABOLIC PANEL WITH GFR
ALT: 26 U/L (ref 0–53)
AST: 16 U/L (ref 0–37)
Albumin: 3.7 g/dL (ref 3.5–5.2)
Alkaline Phosphatase: 40 U/L (ref 39–117)
BUN: 12 mg/dL (ref 6–23)
CO2: 27 meq/L (ref 19–32)
Calcium: 8.9 mg/dL (ref 8.4–10.5)
Chloride: 98 meq/L (ref 96–112)
Creatinine, Ser: 0.76 mg/dL (ref 0.40–1.50)
Glucose, Bld: 96 mg/dL (ref 70–99)
Potassium: 3.7 meq/L (ref 3.5–5.3)
Sodium: 131 meq/L — ABNORMAL LOW (ref 135–145)
Total Bilirubin: 0.9 mg/dL (ref 0.3–1.2)
Total Protein: 7.5 g/dL (ref 6.0–8.3)

## 2009-12-28 LAB — LACTATE DEHYDROGENASE: LDH: 136 U/L (ref 94–250)

## 2010-01-15 LAB — CBC WITH DIFFERENTIAL/PLATELET
BASO%: 0 % (ref 0.0–2.0)
MCHC: 34.8 g/dL (ref 32.0–36.0)
MONO#: 0.2 10*3/uL (ref 0.1–0.9)
RBC: 3.89 10*6/uL — ABNORMAL LOW (ref 4.20–5.82)
RDW: 15 % — ABNORMAL HIGH (ref 11.0–14.6)
WBC: 8 10*3/uL (ref 4.0–10.3)
lymph#: 0.8 10*3/uL — ABNORMAL LOW (ref 0.9–3.3)

## 2010-01-15 LAB — COMPREHENSIVE METABOLIC PANEL
ALT: 30 U/L (ref 0–53)
CO2: 19 mEq/L (ref 19–32)
Calcium: 9.3 mg/dL (ref 8.4–10.5)
Chloride: 104 mEq/L (ref 96–112)
Potassium: 3.5 mEq/L (ref 3.5–5.3)
Sodium: 135 mEq/L (ref 135–145)
Total Bilirubin: 1 mg/dL (ref 0.3–1.2)
Total Protein: 7.5 g/dL (ref 6.0–8.3)

## 2010-01-15 LAB — LACTATE DEHYDROGENASE: LDH: 136 U/L (ref 94–250)

## 2010-01-16 LAB — BETA 2 MICROGLOBULIN, SERUM: Beta-2 Microglobulin: 1.51 mg/L (ref 1.01–1.73)

## 2010-01-16 LAB — KAPPA/LAMBDA LIGHT CHAINS

## 2010-02-01 ENCOUNTER — Ambulatory Visit: Payer: Self-pay | Admitting: Internal Medicine

## 2010-02-26 LAB — COMPREHENSIVE METABOLIC PANEL
ALT: 42 U/L (ref 0–53)
AST: 24 U/L (ref 0–37)
Albumin: 4 g/dL (ref 3.5–5.2)
CO2: 28 mEq/L (ref 19–32)
Calcium: 9.8 mg/dL (ref 8.4–10.5)
Chloride: 103 mEq/L (ref 96–112)
Creatinine, Ser: 0.67 mg/dL (ref 0.40–1.50)
Potassium: 3.9 mEq/L (ref 3.5–5.3)
Sodium: 139 mEq/L (ref 135–145)
Total Protein: 7.6 g/dL (ref 6.0–8.3)

## 2010-02-26 LAB — CBC WITH DIFFERENTIAL/PLATELET
BASO%: 0.6 % (ref 0.0–2.0)
EOS%: 1.6 % (ref 0.0–7.0)
HCT: 41.5 % (ref 38.4–49.9)
MCH: 31.5 pg (ref 27.2–33.4)
MCHC: 33.9 g/dL (ref 32.0–36.0)
MONO#: 0.8 10*3/uL (ref 0.1–0.9)
NEUT%: 51.9 % (ref 39.0–75.0)
RDW: 13.9 % (ref 11.0–14.6)
WBC: 5.6 10*3/uL (ref 4.0–10.3)
lymph#: 1.7 10*3/uL (ref 0.9–3.3)

## 2010-02-26 LAB — LACTATE DEHYDROGENASE: LDH: 183 U/L (ref 94–250)

## 2010-03-20 ENCOUNTER — Ambulatory Visit: Payer: Self-pay | Admitting: Internal Medicine

## 2010-03-21 LAB — CBC WITH DIFFERENTIAL/PLATELET
BASO%: 0.4 % (ref 0.0–2.0)
EOS%: 4.8 % (ref 0.0–7.0)
MCHC: 34 g/dL (ref 32.0–36.0)
MONO#: 0.7 10*3/uL (ref 0.1–0.9)
RBC: 4.46 10*6/uL (ref 4.20–5.82)
WBC: 5.4 10*3/uL (ref 4.0–10.3)
lymph#: 1.1 10*3/uL (ref 0.9–3.3)

## 2010-03-22 LAB — COMPREHENSIVE METABOLIC PANEL
ALT: 40 U/L (ref 0–53)
AST: 21 U/L (ref 0–37)
CO2: 18 mEq/L — ABNORMAL LOW (ref 19–32)
Calcium: 9.4 mg/dL (ref 8.4–10.5)
Chloride: 100 mEq/L (ref 96–112)
Sodium: 134 mEq/L — ABNORMAL LOW (ref 135–145)
Total Bilirubin: 0.7 mg/dL (ref 0.3–1.2)
Total Protein: 7 g/dL (ref 6.0–8.3)

## 2010-03-22 LAB — BETA 2 MICROGLOBULIN, SERUM: Beta-2 Microglobulin: 1.41 mg/L (ref 1.01–1.73)

## 2010-03-22 LAB — LACTATE DEHYDROGENASE: LDH: 188 U/L (ref 94–250)

## 2010-03-22 LAB — KAPPA/LAMBDA LIGHT CHAINS
Kappa:Lambda Ratio: 1.4 (ref 0.26–1.65)
Lambda Free Lght Chn: 0.47 mg/dL — ABNORMAL LOW (ref 0.57–2.63)

## 2010-04-18 LAB — COMPREHENSIVE METABOLIC PANEL
ALT: 91 U/L — ABNORMAL HIGH (ref 0–53)
AST: 30 U/L (ref 0–37)
BUN: 12 mg/dL (ref 6–23)
CO2: 24 mEq/L (ref 19–32)
Creatinine, Ser: 0.89 mg/dL (ref 0.40–1.50)
Sodium: 134 mEq/L — ABNORMAL LOW (ref 135–145)
Total Bilirubin: 1.2 mg/dL (ref 0.3–1.2)

## 2010-04-18 LAB — CBC WITH DIFFERENTIAL/PLATELET
BASO%: 0.4 % (ref 0.0–2.0)
Basophils Absolute: 0 10*3/uL (ref 0.0–0.1)
EOS%: 4 % (ref 0.0–7.0)
Eosinophils Absolute: 0.2 10*3/uL (ref 0.0–0.5)
HCT: 37.6 % — ABNORMAL LOW (ref 38.4–49.9)
HGB: 13.2 g/dL (ref 13.0–17.1)
LYMPH%: 25.6 % (ref 14.0–49.0)
MCH: 31.4 pg (ref 27.2–33.4)
MCHC: 35.2 g/dL (ref 32.0–36.0)
MCV: 89.3 fL (ref 79.3–98.0)
MONO#: 0.6 10*3/uL (ref 0.1–0.9)
MONO%: 14 % (ref 0.0–14.0)
NEUT#: 2.5 10*3/uL (ref 1.5–6.5)
NEUT%: 56 % (ref 39.0–75.0)
Platelets: 200 10*3/uL (ref 140–400)
RBC: 4.21 10*6/uL (ref 4.20–5.82)
RDW: 14.1 % (ref 11.0–14.6)
WBC: 4.5 10*3/uL (ref 4.0–10.3)
lymph#: 1.1 10*3/uL (ref 0.9–3.3)

## 2010-04-18 LAB — LACTATE DEHYDROGENASE: LDH: 173 U/L (ref 94–250)

## 2010-04-23 ENCOUNTER — Ambulatory Visit: Payer: Self-pay | Admitting: Internal Medicine

## 2010-05-23 ENCOUNTER — Ambulatory Visit: Payer: Self-pay | Admitting: Internal Medicine

## 2010-05-23 LAB — CBC WITH DIFFERENTIAL/PLATELET
BASO%: 0.5 % (ref 0.0–2.0)
HGB: 13.2 g/dL (ref 13.0–17.1)
MCH: 31.8 pg (ref 27.2–33.4)
MCHC: 35 g/dL (ref 32.0–36.0)
MONO#: 1 10*3/uL — ABNORMAL HIGH (ref 0.1–0.9)
MONO%: 14.4 % — ABNORMAL HIGH (ref 0.0–14.0)
NEUT%: 61.1 % (ref 39.0–75.0)
Platelets: 235 10*3/uL (ref 140–400)
RDW: 14.3 % (ref 11.0–14.6)
lymph#: 1.5 10*3/uL (ref 0.9–3.3)

## 2010-05-23 LAB — COMPREHENSIVE METABOLIC PANEL
Albumin: 4.4 g/dL (ref 3.5–5.2)
BUN: 9 mg/dL (ref 6–23)
Chloride: 99 mEq/L (ref 96–112)
Creatinine, Ser: 0.67 mg/dL (ref 0.40–1.50)
Glucose, Bld: 91 mg/dL (ref 70–99)
Sodium: 136 mEq/L (ref 135–145)
Total Bilirubin: 0.9 mg/dL (ref 0.3–1.2)

## 2010-06-19 LAB — CBC WITH DIFFERENTIAL/PLATELET
EOS%: 5.1 % (ref 0.0–7.0)
Eosinophils Absolute: 0.3 10*3/uL (ref 0.0–0.5)
HCT: 39.4 % (ref 38.4–49.9)
MCH: 31.6 pg (ref 27.2–33.4)
MCHC: 34.7 g/dL (ref 32.0–36.0)
MONO#: 0.5 10*3/uL (ref 0.1–0.9)
MONO%: 9.6 % (ref 0.0–14.0)
RBC: 4.32 10*6/uL (ref 4.20–5.82)
lymph#: 1.1 10*3/uL (ref 0.9–3.3)

## 2010-06-19 LAB — COMPREHENSIVE METABOLIC PANEL
Alkaline Phosphatase: 34 U/L — ABNORMAL LOW (ref 39–117)
Calcium: 9.2 mg/dL (ref 8.4–10.5)
Chloride: 105 mEq/L (ref 96–112)
Creatinine, Ser: 0.89 mg/dL (ref 0.40–1.50)
Potassium: 3.3 mEq/L — ABNORMAL LOW (ref 3.5–5.3)
Sodium: 137 mEq/L (ref 135–145)
Total Bilirubin: 1.3 mg/dL — ABNORMAL HIGH (ref 0.3–1.2)

## 2010-06-20 LAB — KAPPA/LAMBDA LIGHT CHAINS
Kappa free light chain: 0.6 mg/dL (ref 0.33–1.94)
Lambda Free Lght Chn: <0.42 mg/dL — ABNORMAL LOW (ref 0.57–2.63)

## 2010-06-20 LAB — BETA 2 MICROGLOBULIN, SERUM: Beta-2 Microglobulin: 1.7 mg/L (ref 1.01–1.73)

## 2010-06-20 LAB — IGG, IGA, IGM: IgA: 93 mg/dL (ref 68–378)

## 2010-06-22 ENCOUNTER — Ambulatory Visit: Payer: Self-pay | Admitting: Internal Medicine

## 2010-07-24 ENCOUNTER — Ambulatory Visit: Payer: Self-pay | Admitting: Internal Medicine

## 2010-07-24 LAB — CBC WITH DIFFERENTIAL/PLATELET
BASO%: 0.8 % (ref 0.0–2.0)
HGB: 13.9 g/dL (ref 13.0–17.1)
LYMPH%: 19.9 % (ref 14.0–49.0)
MONO%: 15.4 % — ABNORMAL HIGH (ref 0.0–14.0)
NEUT#: 3.9 10*3/uL (ref 1.5–6.5)
NEUT%: 62.1 % (ref 39.0–75.0)
Platelets: 242 10*3/uL (ref 140–400)
WBC: 6.3 10*3/uL (ref 4.0–10.3)
lymph#: 1.2 10*3/uL (ref 0.9–3.3)

## 2010-07-24 LAB — COMPREHENSIVE METABOLIC PANEL
AST: 24 U/L (ref 0–37)
Albumin: 4 g/dL (ref 3.5–5.2)
Sodium: 133 mEq/L — ABNORMAL LOW (ref 135–145)
Total Bilirubin: 0.8 mg/dL (ref 0.3–1.2)
Total Protein: 7.1 g/dL (ref 6.0–8.3)

## 2010-07-24 LAB — LACTATE DEHYDROGENASE: LDH: 146 U/L (ref 94–250)

## 2010-08-23 ENCOUNTER — Ambulatory Visit: Payer: Self-pay | Admitting: Internal Medicine

## 2010-08-23 LAB — CBC WITH DIFFERENTIAL/PLATELET
Basophils Absolute: 0 10*3/uL (ref 0.0–0.1)
EOS%: 0 % (ref 0.0–7.0)
HGB: 13.5 g/dL (ref 13.0–17.1)
MCH: 32.2 pg (ref 27.2–33.4)
RDW: 14.3 % (ref 11.0–14.6)
lymph#: 0.7 10*3/uL — ABNORMAL LOW (ref 0.9–3.3)

## 2010-08-23 LAB — COMPREHENSIVE METABOLIC PANEL
BUN: 13 mg/dL (ref 6–23)
Calcium: 9.3 mg/dL (ref 8.4–10.5)
Chloride: 100 mEq/L (ref 96–112)
Creatinine, Ser: 0.98 mg/dL (ref 0.40–1.50)
Potassium: 3.8 mEq/L (ref 3.5–5.3)
Total Protein: 7.1 g/dL (ref 6.0–8.3)

## 2010-08-23 LAB — LACTATE DEHYDROGENASE: LDH: 179 U/L (ref 94–250)

## 2010-09-18 LAB — LACTATE DEHYDROGENASE: LDH: 188 U/L (ref 94–250)

## 2010-09-18 LAB — COMPREHENSIVE METABOLIC PANEL
BUN: 9 mg/dL (ref 6–23)
CO2: 25 mEq/L (ref 19–32)
Calcium: 9.7 mg/dL (ref 8.4–10.5)
Chloride: 100 mEq/L (ref 96–112)
Creatinine, Ser: 0.66 mg/dL (ref 0.40–1.50)
Total Bilirubin: 1.9 mg/dL — ABNORMAL HIGH (ref 0.3–1.2)

## 2010-09-18 LAB — CBC WITH DIFFERENTIAL/PLATELET
BASO%: 0.9 % (ref 0.0–2.0)
Basophils Absolute: 0.1 10*3/uL (ref 0.0–0.1)
EOS%: 1.2 % (ref 0.0–7.0)
HCT: 39.6 % (ref 38.4–49.9)
HGB: 13.9 g/dL (ref 13.0–17.1)
MCH: 31.1 pg (ref 27.2–33.4)
MONO#: 0.9 10*3/uL (ref 0.1–0.9)
NEUT%: 56.3 % (ref 39.0–75.0)
RDW: 13.3 % (ref 11.0–14.6)
WBC: 6.7 10*3/uL (ref 4.0–10.3)
lymph#: 1.9 10*3/uL (ref 0.9–3.3)

## 2010-09-19 LAB — KAPPA/LAMBDA LIGHT CHAINS: Lambda Free Lght Chn: <0.47 mg/dL — ABNORMAL LOW (ref 0.57–2.63)

## 2010-09-19 LAB — IGG, IGA, IGM
IgA: 101 mg/dL (ref 68–378)
IgM, Serum: 36 mg/dL — ABNORMAL LOW (ref 60–263)

## 2010-09-19 LAB — BETA 2 MICROGLOBULIN, SERUM: Beta-2 Microglobulin: 1.43 mg/L (ref 1.01–1.73)

## 2010-09-21 ENCOUNTER — Ambulatory Visit (HOSPITAL_COMMUNITY): Admission: RE | Admit: 2010-09-21 | Discharge: 2010-09-21 | Payer: Self-pay | Admitting: Internal Medicine

## 2010-10-15 ENCOUNTER — Ambulatory Visit: Payer: Self-pay | Admitting: Internal Medicine

## 2010-10-17 LAB — CBC WITH DIFFERENTIAL/PLATELET
Basophils Absolute: 0 10*3/uL (ref 0.0–0.1)
EOS%: 3.6 % (ref 0.0–7.0)
Eosinophils Absolute: 0.2 10*3/uL (ref 0.0–0.5)
HCT: 39.9 % (ref 38.4–49.9)
HGB: 14 g/dL (ref 13.0–17.1)
MONO#: 0.6 10*3/uL (ref 0.1–0.9)
NEUT#: 3.2 10*3/uL (ref 1.5–6.5)
RDW: 13.9 % (ref 11.0–14.6)
WBC: 5.1 10*3/uL (ref 4.0–10.3)
lymph#: 1.1 10*3/uL (ref 0.9–3.3)

## 2010-10-17 LAB — COMPREHENSIVE METABOLIC PANEL
AST: 20 U/L (ref 0–37)
Albumin: 4.3 g/dL (ref 3.5–5.2)
BUN: 7 mg/dL (ref 6–23)
CO2: 23 mEq/L (ref 19–32)
Calcium: 9 mg/dL (ref 8.4–10.5)
Chloride: 100 mEq/L (ref 96–112)
Glucose, Bld: 88 mg/dL (ref 70–99)
Potassium: 3.8 mEq/L (ref 3.5–5.3)

## 2010-10-17 LAB — LACTATE DEHYDROGENASE: LDH: 193 U/L (ref 94–250)

## 2010-11-14 ENCOUNTER — Ambulatory Visit: Payer: Self-pay | Admitting: Internal Medicine

## 2010-11-14 LAB — CBC WITH DIFFERENTIAL/PLATELET
BASO%: 0.4 % (ref 0.0–2.0)
EOS%: 0.9 % (ref 0.0–7.0)
MCH: 31 pg (ref 27.2–33.4)
MCHC: 33.2 g/dL (ref 32.0–36.0)
MONO#: 1.1 10*3/uL — ABNORMAL HIGH (ref 0.1–0.9)
NEUT%: 71.2 % (ref 39.0–75.0)
RBC: 4.46 10*6/uL (ref 4.20–5.82)
RDW: 13.6 % (ref 11.0–14.6)
WBC: 9.1 10*3/uL (ref 4.0–10.3)
lymph#: 1.4 10*3/uL (ref 0.9–3.3)

## 2010-11-14 LAB — COMPREHENSIVE METABOLIC PANEL
ALT: 31 U/L (ref 0–53)
CO2: 25 mEq/L (ref 19–32)
Sodium: 138 mEq/L (ref 135–145)
Total Bilirubin: 0.7 mg/dL (ref 0.3–1.2)
Total Protein: 6.8 g/dL (ref 6.0–8.3)

## 2010-11-14 LAB — LACTATE DEHYDROGENASE: LDH: 131 U/L (ref 94–250)

## 2010-12-06 LAB — CBC WITH DIFFERENTIAL/PLATELET
BASO%: 0.7 % (ref 0.0–2.0)
Basophils Absolute: 0 10*3/uL (ref 0.0–0.1)
EOS%: 2.4 % (ref 0.0–7.0)
Eosinophils Absolute: 0.1 10*3/uL (ref 0.0–0.5)
HCT: 39.3 % (ref 38.4–49.9)
HGB: 13.6 g/dL (ref 13.0–17.1)
LYMPH%: 24.9 % (ref 14.0–49.0)
MCH: 31.6 pg (ref 27.2–33.4)
MCHC: 34.5 g/dL (ref 32.0–36.0)
MCV: 91.6 fL (ref 79.3–98.0)
MONO#: 0.8 10*3/uL (ref 0.1–0.9)
MONO%: 17.4 % — ABNORMAL HIGH (ref 0.0–14.0)
NEUT#: 2.6 10*3/uL (ref 1.5–6.5)
NEUT%: 54.6 % (ref 39.0–75.0)
Platelets: 193 10*3/uL (ref 140–400)
RBC: 4.29 10*6/uL (ref 4.20–5.82)
RDW: 13.9 % (ref 11.0–14.6)
WBC: 4.8 10*3/uL (ref 4.0–10.3)
lymph#: 1.2 10*3/uL (ref 0.9–3.3)

## 2010-12-07 LAB — IGG, IGA, IGM
IgA: 98 mg/dL (ref 68–378)
IgG (Immunoglobin G), Serum: 985 mg/dL (ref 694–1618)
IgM, Serum: 39 mg/dL — ABNORMAL LOW (ref 60–263)

## 2010-12-07 LAB — COMPREHENSIVE METABOLIC PANEL
ALT: 30 U/L (ref 0–53)
AST: 22 U/L (ref 0–37)
Albumin: 4.4 g/dL (ref 3.5–5.2)
Alkaline Phosphatase: 32 U/L — ABNORMAL LOW (ref 39–117)
BUN: 11 mg/dL (ref 6–23)
CO2: 23 mEq/L (ref 19–32)
Calcium: 9.2 mg/dL (ref 8.4–10.5)
Chloride: 102 mEq/L (ref 96–112)
Creatinine, Ser: 0.6 mg/dL (ref 0.40–1.50)
Glucose, Bld: 85 mg/dL (ref 70–99)
Potassium: 3.8 mEq/L (ref 3.5–5.3)
Sodium: 136 mEq/L (ref 135–145)
Total Bilirubin: 0.6 mg/dL (ref 0.3–1.2)
Total Protein: 6.5 g/dL (ref 6.0–8.3)

## 2010-12-07 LAB — KAPPA/LAMBDA LIGHT CHAINS
Kappa free light chain: <0.66 mg/dL (ref 0.33–1.94)
Lambda Free Lght Chn: 0.49 mg/dL — ABNORMAL LOW (ref 0.57–2.63)

## 2010-12-07 LAB — LACTATE DEHYDROGENASE: LDH: 150 U/L (ref 94–250)

## 2010-12-07 LAB — BETA 2 MICROGLOBULIN, SERUM: Beta-2 Microglobulin: 1 mg/L — ABNORMAL LOW (ref 1.01–1.73)

## 2010-12-09 ENCOUNTER — Encounter: Payer: Self-pay | Admitting: Internal Medicine

## 2011-01-08 ENCOUNTER — Other Ambulatory Visit: Payer: Self-pay | Admitting: Internal Medicine

## 2011-01-08 ENCOUNTER — Encounter (HOSPITAL_BASED_OUTPATIENT_CLINIC_OR_DEPARTMENT_OTHER): Payer: 59 | Admitting: Internal Medicine

## 2011-01-08 DIAGNOSIS — Z86718 Personal history of other venous thrombosis and embolism: Secondary | ICD-10-CM

## 2011-01-08 DIAGNOSIS — Z7901 Long term (current) use of anticoagulants: Secondary | ICD-10-CM

## 2011-01-08 DIAGNOSIS — C9000 Multiple myeloma not having achieved remission: Secondary | ICD-10-CM

## 2011-01-08 LAB — CBC WITH DIFFERENTIAL/PLATELET
BASO%: 0.1 % (ref 0.0–2.0)
Basophils Absolute: 0 10*3/uL (ref 0.0–0.1)
EOS%: 3.4 % (ref 0.0–7.0)
Eosinophils Absolute: 0.2 10*3/uL (ref 0.0–0.5)
HCT: 42.1 % (ref 38.4–49.9)
HGB: 14.6 g/dL (ref 13.0–17.1)
MCH: 31.5 pg (ref 27.2–33.4)
MCV: 90.6 fL (ref 79.3–98.0)
NEUT#: 4.2 10*3/uL (ref 1.5–6.5)
NEUT%: 69 % (ref 39.0–75.0)
Platelets: 197 10*3/uL (ref 140–400)
RDW: 14.4 % (ref 11.0–14.6)

## 2011-01-08 LAB — COMPREHENSIVE METABOLIC PANEL
ALT: 33 U/L (ref 0–53)
AST: 18 U/L (ref 0–37)
BUN: 13 mg/dL (ref 6–23)
Calcium: 10 mg/dL (ref 8.4–10.5)
Chloride: 104 mEq/L (ref 96–112)
Creatinine, Ser: 0.73 mg/dL (ref 0.40–1.50)
Total Bilirubin: 0.9 mg/dL (ref 0.3–1.2)

## 2011-02-04 ENCOUNTER — Encounter (HOSPITAL_BASED_OUTPATIENT_CLINIC_OR_DEPARTMENT_OTHER): Payer: 59 | Admitting: Internal Medicine

## 2011-02-04 ENCOUNTER — Other Ambulatory Visit: Payer: Self-pay | Admitting: Internal Medicine

## 2011-02-04 DIAGNOSIS — Z7901 Long term (current) use of anticoagulants: Secondary | ICD-10-CM

## 2011-02-04 DIAGNOSIS — C9000 Multiple myeloma not having achieved remission: Secondary | ICD-10-CM

## 2011-02-04 DIAGNOSIS — Z86718 Personal history of other venous thrombosis and embolism: Secondary | ICD-10-CM

## 2011-02-04 LAB — COMPREHENSIVE METABOLIC PANEL
AST: 17 U/L (ref 0–37)
Albumin: 4.3 g/dL (ref 3.5–5.2)
Alkaline Phosphatase: 29 U/L — ABNORMAL LOW (ref 39–117)
BUN: 11 mg/dL (ref 6–23)
Potassium: 3.9 mEq/L (ref 3.5–5.3)
Total Bilirubin: 0.9 mg/dL (ref 0.3–1.2)

## 2011-02-04 LAB — CBC WITH DIFFERENTIAL/PLATELET
Basophils Absolute: 0 10*3/uL (ref 0.0–0.1)
EOS%: 2.1 % (ref 0.0–7.0)
MCH: 31.7 pg (ref 27.2–33.4)
MCV: 91.9 fL (ref 79.3–98.0)
MONO%: 8.3 % (ref 0.0–14.0)
RBC: 4.46 10*6/uL (ref 4.20–5.82)
RDW: 14.6 % (ref 11.0–14.6)

## 2011-02-19 LAB — PROTEIN C ACTIVITY: Protein C Activity: 182 % — ABNORMAL HIGH (ref 75–133)

## 2011-02-19 LAB — DIFFERENTIAL
Basophils Relative: 0 % (ref 0–1)
Eosinophils Absolute: 0 10*3/uL (ref 0.0–0.7)
Monocytes Relative: 9 % (ref 3–12)
Neutro Abs: 5.2 10*3/uL (ref 1.7–7.7)
Neutrophils Relative %: 71 % (ref 43–77)

## 2011-02-19 LAB — BASIC METABOLIC PANEL
BUN: 11 mg/dL (ref 6–23)
Calcium: 8.8 mg/dL (ref 8.4–10.5)
Chloride: 101 mEq/L (ref 96–112)
Chloride: 98 mEq/L (ref 96–112)
Creatinine, Ser: 0.78 mg/dL (ref 0.4–1.5)
GFR calc Af Amer: >60 mL/min (ref >60)
GFR calc Af Amer: >60 mL/min (ref >60)
GFR calc non Af Amer: >60 mL/min (ref >60)
Potassium: 3.9 mEq/L (ref 3.5–5.1)
Sodium: 130 mEq/L — ABNORMAL LOW (ref 135–145)

## 2011-02-19 LAB — COMPREHENSIVE METABOLIC PANEL
ALT: 28 U/L (ref 0–53)
Albumin: 3.2 g/dL — ABNORMAL LOW (ref 3.5–5.2)
Alkaline Phosphatase: 23 U/L — ABNORMAL LOW (ref 39–117)
Calcium: 9.3 mg/dL (ref 8.4–10.5)
GFR calc Af Amer: >60 mL/min (ref >60)
Potassium: 3.9 mEq/L (ref 3.5–5.1)
Sodium: 128 mEq/L — ABNORMAL LOW (ref 135–145)
Total Protein: 10.9 g/dL — ABNORMAL HIGH (ref 6.0–8.3)

## 2011-02-19 LAB — CBC
Hemoglobin: 10.8 g/dL — ABNORMAL LOW (ref 13.0–17.0)
MCHC: 34.3 g/dL (ref 30.0–36.0)
MCV: 93.7 fL (ref 78.0–100.0)
Platelets: 176 10*3/uL (ref 150–400)
RBC: 3.39 MIL/uL — ABNORMAL LOW (ref 4.22–5.81)
RDW: 14 % (ref 11.5–15.5)
WBC: 4.7 10*3/uL (ref 4.0–10.5)

## 2011-02-19 LAB — CULTURE, BLOOD (ROUTINE X 2)
Culture: NO GROWTH
Culture: NO GROWTH

## 2011-02-19 LAB — LUPUS ANTICOAGULANT PANEL
DRVVT: 44.5 secs — ABNORMAL HIGH (ref 34.7–40.5)
PTT Lupus Anticoagulant: 37.9 secs (ref 32.0–43.4)

## 2011-02-19 LAB — URINALYSIS, ROUTINE W REFLEX MICROSCOPIC
Glucose, UA: NEGATIVE mg/dL
Hgb urine dipstick: NEGATIVE
Protein, ur: NEGATIVE mg/dL
Specific Gravity, Urine: 1.03 (ref 1.005–1.030)
pH: 5.5 (ref 5.0–8.0)

## 2011-02-19 LAB — PROTEIN C, TOTAL: Protein C, Total: 127 % (ref 70–140)

## 2011-02-19 LAB — PROTEIN ELECTROPHORESIS, SERUM
Albumin ELP: 39.2 % — ABNORMAL LOW (ref 55.8–66.1)
Alpha-1-Globulin: 3.6 % (ref 2.9–4.9)
Alpha-2-Globulin: 9.7 % (ref 7.1–11.8)

## 2011-02-19 LAB — URINE CULTURE
Colony Count: NO GROWTH
Culture: NO GROWTH

## 2011-02-19 LAB — FACTOR 5 LEIDEN

## 2011-02-19 LAB — KAPPA/LAMBDA LIGHT CHAINS
Kappa free light chain: 14 mg/dL — ABNORMAL HIGH (ref 0.33–1.94)
Lambda free light chains: 0.93 mg/dL (ref 0.57–2.63)

## 2011-02-19 LAB — PROTHROMBIN GENE MUTATION

## 2011-02-19 LAB — VISCOSITY, SERUM: Viscosity, Serum: 2.1 rel to H2O — ABNORMAL HIGH (ref 1.5–1.9)

## 2011-02-20 LAB — CBC
RBC: 4.09 MIL/uL — ABNORMAL LOW (ref 4.22–5.81)
WBC: 5.7 10*3/uL (ref 4.0–10.5)

## 2011-02-20 LAB — PROTIME-INR
INR: 0.96 (ref 0.00–1.49)
Prothrombin Time: 12.7 seconds (ref 11.6–15.2)

## 2011-02-20 LAB — BONE MARROW EXAM

## 2011-02-20 LAB — CHROMOSOME ANALYSIS, BONE MARROW

## 2011-02-27 ENCOUNTER — Encounter (HOSPITAL_BASED_OUTPATIENT_CLINIC_OR_DEPARTMENT_OTHER): Payer: 59 | Admitting: Internal Medicine

## 2011-02-27 ENCOUNTER — Other Ambulatory Visit: Payer: Self-pay | Admitting: Internal Medicine

## 2011-02-27 DIAGNOSIS — Z7901 Long term (current) use of anticoagulants: Secondary | ICD-10-CM

## 2011-02-27 DIAGNOSIS — C9000 Multiple myeloma not having achieved remission: Secondary | ICD-10-CM

## 2011-02-27 DIAGNOSIS — Z86718 Personal history of other venous thrombosis and embolism: Secondary | ICD-10-CM

## 2011-02-27 LAB — CBC WITH DIFFERENTIAL/PLATELET
BASO%: 0 % (ref 0.0–2.0)
LYMPH%: 15.1 % (ref 14.0–49.0)
MCHC: 34.4 g/dL (ref 32.0–36.0)
MONO#: 0.9 10*3/uL (ref 0.1–0.9)
RBC: 3.96 10*6/uL — ABNORMAL LOW (ref 4.20–5.82)
RDW: 14.9 % — ABNORMAL HIGH (ref 11.0–14.6)
WBC: 6.4 10*3/uL (ref 4.0–10.3)
lymph#: 1 10*3/uL (ref 0.9–3.3)

## 2011-02-28 LAB — IGG, IGA, IGM
IgA: 113 mg/dL (ref 68–378)
IgM, Serum: 37 mg/dL — ABNORMAL LOW (ref 60–263)

## 2011-02-28 LAB — COMPREHENSIVE METABOLIC PANEL
ALT: 31 U/L (ref 0–53)
AST: 18 U/L (ref 0–37)
Alkaline Phosphatase: 27 U/L — ABNORMAL LOW (ref 39–117)
Calcium: 9 mg/dL (ref 8.4–10.5)
Glucose, Bld: 86 mg/dL (ref 70–99)
Potassium: 4 mEq/L (ref 3.5–5.3)
Sodium: 136 mEq/L (ref 135–145)
Total Bilirubin: 1 mg/dL (ref 0.3–1.2)
Total Protein: 6.1 g/dL (ref 6.0–8.3)

## 2011-02-28 LAB — LACTATE DEHYDROGENASE: LDH: 162 U/L (ref 94–250)

## 2011-03-06 ENCOUNTER — Encounter (HOSPITAL_BASED_OUTPATIENT_CLINIC_OR_DEPARTMENT_OTHER): Payer: 59 | Admitting: Internal Medicine

## 2011-03-06 DIAGNOSIS — C9000 Multiple myeloma not having achieved remission: Secondary | ICD-10-CM

## 2011-03-06 DIAGNOSIS — Z86718 Personal history of other venous thrombosis and embolism: Secondary | ICD-10-CM

## 2011-03-06 DIAGNOSIS — D649 Anemia, unspecified: Secondary | ICD-10-CM

## 2011-03-20 ENCOUNTER — Encounter (HOSPITAL_BASED_OUTPATIENT_CLINIC_OR_DEPARTMENT_OTHER): Payer: 59 | Admitting: Internal Medicine

## 2011-03-20 ENCOUNTER — Other Ambulatory Visit: Payer: Self-pay | Admitting: Internal Medicine

## 2011-03-20 DIAGNOSIS — Z7901 Long term (current) use of anticoagulants: Secondary | ICD-10-CM

## 2011-03-20 DIAGNOSIS — Z86718 Personal history of other venous thrombosis and embolism: Secondary | ICD-10-CM

## 2011-03-20 DIAGNOSIS — C9000 Multiple myeloma not having achieved remission: Secondary | ICD-10-CM

## 2011-03-20 LAB — URINALYSIS, MICROSCOPIC - CHCC
Ketones: NEGATIVE mg/dL
Protein: 100 mg/dL
Specific Gravity, Urine: 1.03 (ref 1.003–1.035)
pH: 5 (ref 4.6–8.0)

## 2011-04-02 NOTE — Op Note (Signed)
NAME:  DEAUNTE, DENTE NO.:  [id number]   MEDICAL RECORD NO.:  [id number]          PATIENT TYPE:  INP   LOCATION:  4704                         FACILITY:  [facility]   PHYSICIAN:  Shirley Friar, MDDATE OF BIRTH:  1961/06/04   DATE OF PROCEDURE:  05/10/2007  DATE OF DISCHARGE:                               OPERATIVE REPORT   PROCEDURE:  Colonoscopy.   INDICATIONS:  Anemia, heme-positive stool, bright red blood per rectum.   MEDICATIONS:  Fentanyl 100 mcg IV, Versed 8 mg IV.   FINDINGS:  Rectal exam was normal.  An adult Pentax colonoscope was  inserted into a well prepped colon and advanced to the cecum, where the  ileocecal valve and appendiceal orifice were identified.  The terminal  ileum was intubated and was normal in appearance.  On careful withdrawal  the colonoscope revealed no mucosal abnormalities.  Here retroflexion  revealed small internal hemorrhoids.   ASSESSMENT:  1. Small internal hemorrhoids, otherwise normal colonoscopy.  2. Hemoccult positive stool, most likely from his internal      hemorrhoids.  3. No source of anemia seen on colonoscopy.   PLAN:  1. Advance diet.  2. Recommend hematology evaluation.  3. If anemia worsens and hematology evaluation negative, then will      consider capsule endoscopy.      Shirley Friar, MD  Electronically Signed     VCS/MEDQ  D:  05/10/2007  T:  05/10/2007  Job:  [id number]

## 2011-04-02 NOTE — Op Note (Signed)
NAME:  [name] NO.:  [id number]   MEDICAL RECORD NO.:  [id number]          PATIENT TYPE:  INP   LOCATION:  4704                         FACILITY:  [facility]   PHYSICIAN:  Shirley Friar, MDDATE OF BIRTH:  1961-11-15   DATE OF PROCEDURE:  05/09/2007  DATE OF DISCHARGE:                               OPERATIVE REPORT   PROCEDURE:  Upper endoscopy.   INDICATIONS:  Anemia, NSAID abuse.   MEDICATIONS:  Fentanyl 100 mcg IV, Versed 8 mg IV.   FINDINGS:  The endoscope was inserted to the oropharynx and esophagus  was intubated, which was normal in its entirety.  The endoscope was  advanced into the stomach, which revealed clear bilious fluid.  There  were a few small superficial erosions in the prepyloric channel, without  any frank ulceration or ulcers seen.  Retroflexion was unremarkable.  The endoscope was straightened and advanced then into the duodenal bulb,  which revealed a small erosion in the duodenal bulb.  The second portion  of duodenum was also visualized, and this was normal in appearance.   ASSESSMENT:  1. Mild gastric erosions in the prepyloric channel.  2. One duodenal erosion.  3. No ulcer or source of anemia found.   PLAN:  1. Avoid NSAIDs.  2. Do colonoscopy on May 10, 2007.      Shirley Friar, MD  Electronically Signed     VCS/MEDQ  D:  05/09/2007  T:  05/09/2007  Job:  [id number]

## 2011-04-02 NOTE — H&P (Signed)
NAME:  [name], [name] NO.:  [id number]   MEDICAL RECORD NO.:  [id number]          PATIENT TYPE:  EMS   LOCATION:  MAJO                         FACILITY:  [facility]   PHYSICIAN:  Madaline Savage, MD        DATE OF BIRTH:  [birth year]   DATE OF ADMISSION:  05/07/2007  DATE OF DISCHARGE:                              HISTORY & PHYSICAL   PRIMARY CARE PHYSICIAN:  Madelin Rear. Sherwood Gambler, M.D. in [place].   This patient is unassigned to [place].   CHIEF COMPLAINT:  Fever.   HISTORY OF PRESENT ILLNESS:  [name] is a 50 year old African-  American gentleman with a history of hypertension who comes in with a  fever of one week's duration.  He apparently started developing fever of  up to 102 a week ago.  He denies having any other complaints with the  fever at that time.  The fever used to come down when he takes Tylenol,  but it goes up to 102.  He also started having some headaches with the  fever, which he states is all over his head.  He denies any photophobia  or any neck stiffness.  He denies any nausea, but he states he threw up  a couple of times, and he had some dry heaves.  He also denies any  shortness of breath or any chest pain, any abdominal pain, any diarrhea  or constipation.   He was complaining of a left leg pain for the last couple of months, for  which his primary care doctor was running some tests.  He apparently did  an MRI of his back and his legs, both of which were apparently  unremarkable, per the patient.  He has been taking a lot of pain  medications.  He states that he has been taking a lot of aspirin,  meloxicam, and Vicodin for his pain.  He did not notice any melena or  any blood in the stools or any blood in his vomitus.  He was found to  have a hemoglobin of 7.8 in the ED and stool for occult blood was done,  which was positive.   PAST MEDICAL HISTORY:  History of hypertension, which is under good  control.   PAST SURGICAL HISTORY:  He had a  finger surgery a few years ago.   ALLERGIES:  No known drug allergies.   CURRENT MEDICATIONS:  Lisinopril/hydrochlorothiazide 20/12.5 1 tablet  daily.   SOCIAL HISTORY:  He denies any history of smoking, alcohol, or drug  abuse.  He denies any recent travel.  He does have a dog and a cat as a  pet.   FAMILY HISTORY:  His dad is 94.  He has kidney failure, and he is on  dialysis.  His mother is 58, and she is healthy.   REVIEW OF SYSTEMS:  GENERAL:  He denies any recent weight loss or weight  gain.  He denies any chills.  HEENT:  No headaches or blurred vision or  sore throat.  CARDIOVASCULAR:  He denies chest pain, palpitations.  RESPIRATORY:  No shortness of  breath or cough.  GI:  No abdominal pain,  nausea, vomiting, diarrhea, or constipation.   PHYSICAL EXAMINATION:  GENERAL:  He is alert and oriented x3.  VITAL SIGNS:  Temperature 102.1, pulse rate 85, blood pressure 116/69,  oxygen saturation 100% on room air.  HEENT:  Head is normocephalic and atraumatic.  Pupils are bilaterally  equal and reactive to light.  Mucous membranes are moist.  NECK:  Supple.  No JVD.  No carotid bruits.  CARDIOVASCULAR:  S1 and S2 heard.  Regular rate and rhythm.  No murmurs,  thrills, or gallops.  CHEST:  Clear to auscultation.  ABDOMEN:  Soft.  Bowel sounds heard.  EXTREMITIES:  No clubbing, cyanosis or edema.  Peripheral pulses are  present.   Labs show a white count of 3.5, hemoglobin 7.8 with an MCV of 90.2,  platelets 161.  His urinalysis is negative.   His chest x-ray shows bronchitic changes.   His BNP shows a sodium of 138, potassium 3.9.  His creatinine is 1.2.   IMPRESSION:  1. Febrile illness of unknown etiology.  2. Headache.  3. Gastrointestinal bleed.  4. Acute blood-loss anemia.  5. Leukopenia.  6. Left leg pain.  7. History of hypertension.   PLAN:  1. Febrile illness:  This is a 50 year old gentleman who comes in with      one week of high-grade fever.  At this  time, he has a fever of      102.1.  His source of infection is not clear.  His urinalysis is      negative.  His chest x-ray does not show any infiltrates.  He is      relatively asymptomatic except for an occasional headache.  He does      not have any nuchal rigidity or any photophobia at this time.      Since we cannot find any other source of infection, I will order a      lumbar puncture and fluoroscopy to rule out meningitis.  I will      also send blood cultures and urine cultures.  I will watch him off      antibiotics at this time.  If we do not find any source of      infection, we can consider a right upper quadrant ultrasound scan.  2. Gastrointestinal bleed:  He comes in with a hemoglobin at 7.8 and a      positive Hemoccult.  He apparently has been taking a lot of aspirin      of late for his leg pain, which is most likely the etiology of his      GI bleed.  I will put him on IV Protonix at this time, and we will      call GI in the morning.  I will keep him n.p.o. at this time.  I      will follow his H&H while he is in the hospital.  3. Leukopenia:  His white count is marginally low at 3.5.  This could      be secondary to his infection.  I do not have any old labs to see      what his baseline is.  At this time, we will follow his white count      while he is in the hospital.  4. Left leg pain, which is not acute, which has been going on for the      last couple of months.  It  is being worked up as an outpatient.  I      will order a CPK and LDH at this time.  5. History of hypertension:  At this time, his blood pressure is      controlled.  I will hold his blood pressure medications at this      time and will restart if his blood pressure starts to go up.  6. I will put him on SCDs for prophylaxis and I will put him on IV      Protonix.      Madaline Savage, MD  Electronically Signed    PKN/MEDQ  D:  05/08/2007  T:  05/08/2007  Job:  [id number]

## 2011-04-02 NOTE — Consult Note (Signed)
NAME:  MARTESE, VANATTA NO.:  [id number]   MEDICAL RECORD NO.:  [id number]          PATIENT TYPE:  INP   LOCATION:  4707                         FACILITY:  [facility]   PHYSICIAN:  Graylin Shiver, M.D.   DATE OF BIRTH:  07-09-1961   DATE OF CONSULTATION:  DATE OF DISCHARGE:                                 CONSULTATION   We were asked to see Mr. Holstein today in consult for drop in  hemoglobin with heme-positive stool by accompanying [facility] team D.   HISTORY OF PRESENT ILLNESS:  This is a 50 year old male who was admitted  to the hospital for 1 week of headaches associated with a 102 degree  fever.  He also tells me that for the past month he has had a history of  severe left leg pain and for 2 weeks he took 500 mg of extra strength  Bayer Back and Body six times a day totaling 3000 mg of Bayer aspirin  per day for 2 weeks.  He stopped taking the Bayer approximately 2 weeks  ago when he started experiencing stomach pain, epistaxis, nausea and a  continuous taste of aspirin in his mouth.  He denies any black stool,  vomiting, shortness of breath, dysphasia, recent heartburn.  He does  tell me that he has a small amount of bright red blood per rectum  occasionally on his toilet tissue that he attributes to hemorrhoids.  He  also has a history of heartburn for which he took Nexium and he has not  had any heartburn in many years.   PAST MEDICAL HISTORY:  Is significant for hypertension.  He is a patient  of Dr. Sharyon Medicus in [place].  He has never had an endoscopy or  colonoscopy.   SOCIAL HISTORY:  No alcohol.  No tobacco.  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   FAMILY HISTORY:  No peptic ulcer disease.  No bowel disease or colon  cancer.   REVIEW OF SYSTEMS:  No recent weight loss or other recent illness.  No  sick contacts.   PHYSICAL EXAM:  He is alert and oriented, lying flat on his back still  after just having had a lumbar  puncture.  HEART:  His heart has a regular rate and rhythm.  LUNGS are clear to auscultation and anteriorly.  ABDOMEN is soft, nontender, nondistended with good bowel sounds.  RECTAL EXAM was deferred.  However, he was previously guaiac positive on  June 19 VITALS:  Temperature is 99.4, pulse 78, respirations are 20,  blood pressure is 101/52.   CURRENT MEDICATIONS:  Are only lisinopril and hydrocodone.   ALLERGIES:  He has no known drug allergies.   CURRENT LABS:  He was admitted with a hemoglobin of 7.8 which dropped  overnight to 7.2.  His MCV value is 90, hematocrit 20.6, white blood  count 2.8, platelets 145.  Chem-7 shows a sodium 133, potassium 3.4.  His BUN is 14.  Coags are normal with a PT of 14.9, PTT of 37.  LFTs are  normal.  Albumin is 2.0.  ASSESSMENT:  Dr. Wandalee Ferdinand has seen and examined the patient and  collected a history.  This is likely upper GI bleed.  Will transfuse 2  units of packed red blood cells, monitor hematocrit and hemoglobin.  Guaiac stools, likely upper endoscopy in the morning by Dr. Charlott Rakes.   Thanks very much for this consultation.      Stephani Police, PA    ______________________________  Graylin Shiver, M.D.    MLY/MEDQ  D:  05/08/2007  T:  05/08/2007  Job:  [id number]   cc:   Madelin Rear. Sherwood Gambler, MD  Shirley Friar, MD

## 2011-04-02 NOTE — Discharge Summary (Signed)
NAME:  [name], [name] NO.:  [id number]   MEDICAL RECORD NO.:  [id number]          PATIENT TYPE:  INP   LOCATION:  4704                         FACILITY:  [facility]   PHYSICIAN:  Isidor Holts, M.D.  DATE OF BIRTH:  04/02/1961   DATE OF ADMISSION:  05/07/2007  DATE OF DISCHARGE:  05/11/2007                               DISCHARGE SUMMARY   PRIMARY CARE PHYSICIAN:  Madelin Rear. Sherwood Gambler, MD, [facility], [place], [place].  The patient is unassigned to [place].   DISCHARGE DIAGNOSES:  1. Pyrexia, likely secondary to viral illness.  2. Normocytic anemia.  3. Hypertension.  4. Bicytopenia.   DISCHARGE MEDICATIONS:  1. Lisinopril/hydrochlorothiazide 20/12.5 mg one p.o. daily.  2. Nu-Iron 150 mg p.o. b.i.d.   PROCEDURES:  1. Two-view chest x-ray dated May 07, 2007.  This showed mild      bronchitic changes, no peripheral consolidation.  2. Head CT scan dated May 07, 2007.  This showed no acute      intracranial abnormality, frontal lobar atrophic changes for age,      possible empty sella.  Also calvarial lytic lesions most suspicious      for multiple myeloma.  3. Lumbar puncture dated May 08, 2007, by Jonelle Sports. Frazier Richards, M.D.,      interventional radiologist.  This was a successful procedure.      Opening pressure was 26 cmH2O.  Gram stain was negative, wbc's 1,      rbc's 98, glucose 61, protein 41.  4. 2 D echocardiogram dated May 08, 2007.  This showed overall normal      left ventricular systolic function, EF of 55-60%.  The study was      inadequate for evaluation of left ventricular regional wall motion.      Left ventricular wall thickness was mildly increased.  Doppler      parameters were consistent with high left ventricular filling      pressure.   CONSULTATIONS:  Shirley Friar, MD, gastroenterologist.   ADMISSION HISTORY:  As in H&P note from May 07, 2007, dictated by Madaline Savage, MD.  However, in brief, this is a  50 year old male, with a past  medical history significant only for hypertension, who presents with a  history of fever of approximately 1 week's duration described as  intermittent and has reached a maximum of 102.  Lately the patient  started developing headaches, although he denies photophobia or neck  stiffness.  He denies rash, denies sick contacts.  In addition, has been  under evaluation by his primary MD for left leg pain and he reportedly,  just had MRI of back, which was reportedly unremarkable.  On initial  evaluation in the emergency department, he was found to have a  hemoglobin of 7.8 and positive fecal occult blood testing.  Reportedly,  the patient had been taking Aspirin and Meloxicam in addition to Vicodin  for his back and leg pains.  He was admitted for further evaluation,  investigation and management.   CLINICAL COURSE:  Problem 1.  PYREXIA:  The patient had  no recorded pyrexia during the  course of his hospitalization.  T-max was in the region of about 99.1.  A full septic workup including both blood cultures, urinalysis, 2 D  echocardiogram, lumbar puncture, chest x-ray, were all unrevealing.  It  is likely that the patient may have had a viral syndrome.   Problem 2.  BICYTOPENIA:  The patient presents with a hemoglobin of 7.8,  WBC of 3.5, platelets 161, MCV 90.2.  He was transfused with 2 units of  PRBC with satisfactory bump in hemoglobin level to 9.3 on May 08, 2007.  GI consultation was called for GI workup and he underwent an upper and  lower GI endoscopy.  Upper GI endoscopy on May 09, 2007, showed few  gastric erosions, one duodenal abrasion, no ulcers, no active bleeding.  He subsequently underwent colonoscopy on May 10, 2007, which showed  small internal hemorrhoids but was otherwise normal.  These procedures  were done by Dr. Charlott Rakes, gastroenterologist, who was called in  consultation.  The patient is to follow up with him on an  outpatient  basis.  It is possible that capsule endoscopy will be considered at a  later time.  The patient has been commenced on iron supplements.  A  telephone discussion was held with [facility] K. Arbutus Ped, MD, hematologist,  and Dr. Arbutus Ped has agreed to see the patient in the outpatient  department for his bicytopenia.  Interestingly, head CT scan done as  part of the patient's initial evaluation showed calvarial lytic lesions  being suspicious for multiple myeloma.  We shall defer workup of this to  Dr. Arbutus Ped.  Note, the patient declined HIV testing.  [facility]  spotted fever titers were obtained; however, at the time of this  dictation, no reports were available. Patient declined empiric  Doxycycline therapy.   Problem 3.  HYPERTENSION:  The patient remained normotensive, during the  course of his hospitalization; however, on May 11, 2007, blood pressure  was mildly elevated at 152/84 mmHg.  He has been recommended to restart  his pre=admission antihypertensive medications.   DISPOSITION:  The patient was considered clinically stable for discharge  on May 11, 2007.   DIET:  Heart-healthy diet.   ACTIVITY:  As tolerated.   FOLLOW-UP INSTRUCTIONS:  He is recommended to follow up with Dr. Velora Heckler. Mohamed, hematologist, for his bicytopenia and workup of possible  etiologies, and he has been supplied appropriate information to schedule  an appointment, i.e., telephone number [phone].  He is to call Renee  for appointment.  In addition, the patient is scheduled to see Dr.  Charlott Rakes, gastroenterologist, in July 2008, telephone number [phone].  He is to follow up with his primary MD, Dr. Sherwood Gambler, routinely.  The patient is recommended to return to work on May 15, 2007.  He has  been recommended to avoid NSAIDs.      Isidor Holts, M.D.  Electronically Signed    CO/MEDQ  D:  05/11/2007  T:  05/11/2007  Job:  [id number]   cc:   Madelin Rear. Sherwood Gambler, MD   Lajuana Matte, MD  Shirley Friar, MD

## 2011-04-03 ENCOUNTER — Other Ambulatory Visit: Payer: Self-pay | Admitting: Internal Medicine

## 2011-04-03 ENCOUNTER — Encounter (HOSPITAL_BASED_OUTPATIENT_CLINIC_OR_DEPARTMENT_OTHER): Payer: 59 | Admitting: Internal Medicine

## 2011-04-03 DIAGNOSIS — D649 Anemia, unspecified: Secondary | ICD-10-CM

## 2011-04-03 DIAGNOSIS — Z86718 Personal history of other venous thrombosis and embolism: Secondary | ICD-10-CM

## 2011-04-03 DIAGNOSIS — C9000 Multiple myeloma not having achieved remission: Secondary | ICD-10-CM

## 2011-04-03 LAB — URINALYSIS, MICROSCOPIC - CHCC
Blood: NEGATIVE
Nitrite: NEGATIVE
Specific Gravity, Urine: 1.03 (ref 1.003–1.035)
pH: 6 (ref 4.6–8.0)

## 2011-04-03 LAB — CBC WITH DIFFERENTIAL/PLATELET
Basophils Absolute: 0 10*3/uL (ref 0.0–0.1)
Eosinophils Absolute: 0.2 10*3/uL (ref 0.0–0.5)
HCT: 40.3 % (ref 38.4–49.9)
HGB: 13.9 g/dL (ref 13.0–17.1)
LYMPH%: 17.9 % (ref 14.0–49.0)
MCV: 92.8 fL (ref 79.3–98.0)
MONO%: 7.9 % (ref 0.0–14.0)
NEUT#: 4 10*3/uL (ref 1.5–6.5)
NEUT%: 70.8 % (ref 39.0–75.0)
Platelets: 200 10*3/uL (ref 140–400)

## 2011-04-03 LAB — COMPREHENSIVE METABOLIC PANEL
ALT: 37 U/L (ref 0–53)
Albumin: 4.5 g/dL (ref 3.5–5.2)
BUN: 9 mg/dL (ref 6–23)
CO2: 25 mEq/L (ref 19–32)
Calcium: 10.6 mg/dL — ABNORMAL HIGH (ref 8.4–10.5)
Chloride: 101 mEq/L (ref 96–112)
Creatinine, Ser: 0.63 mg/dL (ref 0.40–1.50)
Potassium: 4.6 mEq/L (ref 3.5–5.3)

## 2011-04-03 LAB — LACTATE DEHYDROGENASE: LDH: 205 U/L (ref 94–250)

## 2011-05-01 ENCOUNTER — Other Ambulatory Visit: Payer: Self-pay | Admitting: Internal Medicine

## 2011-05-01 ENCOUNTER — Encounter (HOSPITAL_BASED_OUTPATIENT_CLINIC_OR_DEPARTMENT_OTHER): Payer: 59 | Admitting: Internal Medicine

## 2011-05-01 DIAGNOSIS — D649 Anemia, unspecified: Secondary | ICD-10-CM

## 2011-05-01 DIAGNOSIS — Z86718 Personal history of other venous thrombosis and embolism: Secondary | ICD-10-CM

## 2011-05-01 DIAGNOSIS — C9000 Multiple myeloma not having achieved remission: Secondary | ICD-10-CM

## 2011-05-01 DIAGNOSIS — Z7901 Long term (current) use of anticoagulants: Secondary | ICD-10-CM

## 2011-05-01 LAB — CBC WITH DIFFERENTIAL/PLATELET
BASO%: 0.2 % (ref 0.0–2.0)
Basophils Absolute: 0 10*3/uL (ref 0.0–0.1)
EOS%: 0.4 % (ref 0.0–7.0)
Eosinophils Absolute: 0 10*3/uL (ref 0.0–0.5)
HCT: 42.1 % (ref 38.4–49.9)
HGB: 14.6 g/dL (ref 13.0–17.1)
LYMPH%: 12.8 % — ABNORMAL LOW (ref 14.0–49.0)
MCH: 31.1 pg (ref 27.2–33.4)
MCHC: 34.7 g/dL (ref 32.0–36.0)
MCV: 89.8 fL (ref 79.3–98.0)
MONO#: 0.4 10*3/uL (ref 0.1–0.9)
MONO%: 7.2 % (ref 0.0–14.0)
NEUT#: 4.3 10*3/uL (ref 1.5–6.5)
NEUT%: 79.4 % — ABNORMAL HIGH (ref 39.0–75.0)
Platelets: 156 10*3/uL (ref 140–400)
RBC: 4.69 10*6/uL (ref 4.20–5.82)
RDW: 13.7 % (ref 11.0–14.6)
WBC: 5.4 10*3/uL (ref 4.0–10.3)
lymph#: 0.7 10*3/uL — ABNORMAL LOW (ref 0.9–3.3)
nRBC: 0 % (ref 0–0)

## 2011-05-01 LAB — COMPREHENSIVE METABOLIC PANEL
ALT: 35 U/L (ref 0–53)
AST: 28 U/L (ref 0–37)
Albumin: 4.1 g/dL (ref 3.5–5.2)
Alkaline Phosphatase: 26 U/L — ABNORMAL LOW (ref 39–117)
BUN: 9 mg/dL (ref 6–23)
CO2: 22 mEq/L (ref 19–32)
Calcium: 8.6 mg/dL (ref 8.4–10.5)
Chloride: 98 mEq/L (ref 96–112)
Creatinine, Ser: 0.78 mg/dL (ref 0.50–1.35)
Glucose, Bld: 115 mg/dL — ABNORMAL HIGH (ref 70–99)
Potassium: 3.5 mEq/L (ref 3.5–5.3)
Sodium: 132 mEq/L — ABNORMAL LOW (ref 135–145)
Total Bilirubin: 1.8 mg/dL — ABNORMAL HIGH (ref 0.3–1.2)
Total Protein: 6.8 g/dL (ref 6.0–8.3)

## 2011-05-21 ENCOUNTER — Encounter (HOSPITAL_BASED_OUTPATIENT_CLINIC_OR_DEPARTMENT_OTHER): Payer: 59 | Admitting: Internal Medicine

## 2011-05-21 ENCOUNTER — Other Ambulatory Visit: Payer: Self-pay | Admitting: Internal Medicine

## 2011-05-21 DIAGNOSIS — D649 Anemia, unspecified: Secondary | ICD-10-CM

## 2011-05-21 DIAGNOSIS — Z86718 Personal history of other venous thrombosis and embolism: Secondary | ICD-10-CM

## 2011-05-21 DIAGNOSIS — C9000 Multiple myeloma not having achieved remission: Secondary | ICD-10-CM

## 2011-05-21 LAB — CBC WITH DIFFERENTIAL/PLATELET
Basophils Absolute: 0 10*3/uL (ref 0.0–0.1)
Eosinophils Absolute: 0.1 10*3/uL (ref 0.0–0.5)
HCT: 38.5 % (ref 38.4–49.9)
HGB: 13.4 g/dL (ref 13.0–17.1)
LYMPH%: 19.5 % (ref 14.0–49.0)
MCV: 92.1 fL (ref 79.3–98.0)
MONO#: 0.8 10*3/uL (ref 0.1–0.9)
MONO%: 12.1 % (ref 0.0–14.0)
NEUT#: 4.3 10*3/uL (ref 1.5–6.5)
NEUT%: 66.4 % (ref 39.0–75.0)
Platelets: 187 10*3/uL (ref 140–400)
RBC: 4.18 10*6/uL — ABNORMAL LOW (ref 4.20–5.82)
WBC: 6.4 10*3/uL (ref 4.0–10.3)

## 2011-05-21 LAB — COMPREHENSIVE METABOLIC PANEL
Alkaline Phosphatase: 35 U/L — ABNORMAL LOW (ref 39–117)
BUN: 11 mg/dL (ref 6–23)
CO2: 22 mEq/L (ref 19–32)
Creatinine, Ser: 0.55 mg/dL (ref 0.50–1.35)
Glucose, Bld: 91 mg/dL (ref 70–99)
Total Bilirubin: 0.7 mg/dL (ref 0.3–1.2)
Total Protein: 7.5 g/dL (ref 6.0–8.3)

## 2011-05-21 LAB — LACTATE DEHYDROGENASE: LDH: 205 U/L (ref 94–250)

## 2011-05-23 LAB — BETA 2 MICROGLOBULIN, SERUM: Beta-2 Microglobulin: 1.79 mg/L — ABNORMAL HIGH (ref 1.01–1.73)

## 2011-05-23 LAB — KAPPA/LAMBDA LIGHT CHAINS: Kappa free light chain: 2.99 mg/dL — ABNORMAL HIGH (ref 0.33–1.94)

## 2011-05-23 LAB — IGG, IGA, IGM
IgA: 124 mg/dL (ref 68–379)
IgG (Immunoglobin G), Serum: 1270 mg/dL (ref 650–1600)

## 2011-06-04 ENCOUNTER — Encounter (HOSPITAL_BASED_OUTPATIENT_CLINIC_OR_DEPARTMENT_OTHER): Payer: 59 | Admitting: Internal Medicine

## 2011-06-04 DIAGNOSIS — Z86718 Personal history of other venous thrombosis and embolism: Secondary | ICD-10-CM

## 2011-06-04 DIAGNOSIS — C9000 Multiple myeloma not having achieved remission: Secondary | ICD-10-CM

## 2011-06-04 DIAGNOSIS — Z7901 Long term (current) use of anticoagulants: Secondary | ICD-10-CM

## 2011-07-02 ENCOUNTER — Other Ambulatory Visit: Payer: Self-pay | Admitting: Internal Medicine

## 2011-07-02 ENCOUNTER — Encounter (HOSPITAL_BASED_OUTPATIENT_CLINIC_OR_DEPARTMENT_OTHER): Payer: 59 | Admitting: Internal Medicine

## 2011-07-02 DIAGNOSIS — Z7901 Long term (current) use of anticoagulants: Secondary | ICD-10-CM

## 2011-07-02 DIAGNOSIS — C9000 Multiple myeloma not having achieved remission: Secondary | ICD-10-CM

## 2011-07-02 DIAGNOSIS — Z86718 Personal history of other venous thrombosis and embolism: Secondary | ICD-10-CM

## 2011-07-02 LAB — COMPREHENSIVE METABOLIC PANEL
ALT: 32 U/L (ref 0–53)
AST: 18 U/L (ref 0–37)
Albumin: 4.4 g/dL (ref 3.5–5.2)
BUN: 9 mg/dL (ref 6–23)
Calcium: 10 mg/dL (ref 8.4–10.5)
Chloride: 100 mEq/L (ref 96–112)
Potassium: 4 mEq/L (ref 3.5–5.3)
Sodium: 138 mEq/L (ref 135–145)
Total Protein: 7.1 g/dL (ref 6.0–8.3)

## 2011-07-02 LAB — CBC WITH DIFFERENTIAL/PLATELET
BASO%: 0.2 % (ref 0.0–2.0)
Basophils Absolute: 0 10*3/uL (ref 0.0–0.1)
EOS%: 2 % (ref 0.0–7.0)
HCT: 39.6 % (ref 38.4–49.9)
MCHC: 34.8 g/dL (ref 32.0–36.0)
MCV: 92.5 fL (ref 79.3–98.0)
NEUT#: 5.7 10*3/uL (ref 1.5–6.5)
Platelets: 197 10*3/uL (ref 140–400)
RDW: 14.9 % — ABNORMAL HIGH (ref 11.0–14.6)
WBC: 8.5 10*3/uL (ref 4.0–10.3)
lymph#: 1.5 10*3/uL (ref 0.9–3.3)

## 2011-08-01 ENCOUNTER — Other Ambulatory Visit: Payer: Self-pay | Admitting: Internal Medicine

## 2011-08-01 ENCOUNTER — Encounter (HOSPITAL_BASED_OUTPATIENT_CLINIC_OR_DEPARTMENT_OTHER): Payer: 59 | Admitting: Internal Medicine

## 2011-08-01 DIAGNOSIS — Z86718 Personal history of other venous thrombosis and embolism: Secondary | ICD-10-CM

## 2011-08-01 DIAGNOSIS — Z7901 Long term (current) use of anticoagulants: Secondary | ICD-10-CM

## 2011-08-01 DIAGNOSIS — C9000 Multiple myeloma not having achieved remission: Secondary | ICD-10-CM

## 2011-08-01 LAB — COMPREHENSIVE METABOLIC PANEL
AST: 26 U/L (ref 0–37)
BUN: 8 mg/dL (ref 6–23)
Calcium: 10.3 mg/dL (ref 8.4–10.5)
Chloride: 97 mEq/L (ref 96–112)
Creatinine, Ser: 0.48 mg/dL — ABNORMAL LOW (ref 0.50–1.35)
Glucose, Bld: 98 mg/dL (ref 70–99)

## 2011-08-01 LAB — CBC WITH DIFFERENTIAL/PLATELET
BASO%: 0.3 % (ref 0.0–2.0)
EOS%: 5.8 % (ref 0.0–7.0)
LYMPH%: 19 % (ref 14.0–49.0)
MCH: 31.5 pg (ref 27.2–33.4)
MCHC: 35.4 g/dL (ref 32.0–36.0)
MCV: 89 fL (ref 79.3–98.0)
MONO#: 0.8 10*3/uL (ref 0.1–0.9)
MONO%: 13.5 % (ref 0.0–14.0)
Platelets: 169 10*3/uL (ref 140–400)
RBC: 4.47 10*6/uL (ref 4.20–5.82)
WBC: 5.9 10*3/uL (ref 4.0–10.3)

## 2011-08-22 ENCOUNTER — Other Ambulatory Visit: Payer: Self-pay | Admitting: Internal Medicine

## 2011-08-22 ENCOUNTER — Encounter (HOSPITAL_BASED_OUTPATIENT_CLINIC_OR_DEPARTMENT_OTHER): Payer: 59 | Admitting: Internal Medicine

## 2011-08-22 DIAGNOSIS — Z7901 Long term (current) use of anticoagulants: Secondary | ICD-10-CM

## 2011-08-22 DIAGNOSIS — C9000 Multiple myeloma not having achieved remission: Secondary | ICD-10-CM

## 2011-08-22 DIAGNOSIS — Z86718 Personal history of other venous thrombosis and embolism: Secondary | ICD-10-CM

## 2011-08-22 LAB — CBC WITH DIFFERENTIAL/PLATELET
Basophils Absolute: 0 10*3/uL (ref 0.0–0.1)
Eosinophils Absolute: 0.1 10*3/uL (ref 0.0–0.5)
HGB: 13.2 g/dL (ref 13.0–17.1)
MCV: 93.4 fL (ref 79.3–98.0)
MONO#: 1 10*3/uL — ABNORMAL HIGH (ref 0.1–0.9)
NEUT#: 3.5 10*3/uL (ref 1.5–6.5)
RBC: 4.05 10*6/uL — ABNORMAL LOW (ref 4.20–5.82)
RDW: 13.8 % (ref 11.0–14.6)
WBC: 5.7 10*3/uL (ref 4.0–10.3)
lymph#: 1.1 10*3/uL (ref 0.9–3.3)

## 2011-08-23 LAB — COMPREHENSIVE METABOLIC PANEL
Albumin: 4 g/dL (ref 3.5–5.2)
Alkaline Phosphatase: 28 U/L — ABNORMAL LOW (ref 39–117)
CO2: 20 mEq/L (ref 19–32)
Calcium: 8.9 mg/dL (ref 8.4–10.5)
Chloride: 105 mEq/L (ref 96–112)
Glucose, Bld: 89 mg/dL (ref 70–99)
Potassium: 3.7 mEq/L (ref 3.5–5.3)
Sodium: 137 mEq/L (ref 135–145)
Total Protein: 7.2 g/dL (ref 6.0–8.3)

## 2011-08-23 LAB — IGG, IGA, IGM: IgG (Immunoglobin G), Serum: 1500 mg/dL (ref 650–1600)

## 2011-08-23 LAB — CBC
Hemoglobin: 12.6 — ABNORMAL LOW
RBC: 3.92 — ABNORMAL LOW
RDW: 14.9

## 2011-08-23 LAB — PROTIME-INR: INR: 1

## 2011-08-23 LAB — KAPPA/LAMBDA LIGHT CHAINS
Kappa:Lambda Ratio: 5.14 — ABNORMAL HIGH (ref 0.26–1.65)
Lambda Free Lght Chn: 0.97 mg/dL (ref 0.57–2.63)

## 2011-08-23 LAB — BONE MARROW EXAM

## 2011-08-23 LAB — LACTATE DEHYDROGENASE: LDH: 167 U/L (ref 94–250)

## 2011-08-27 ENCOUNTER — Encounter (HOSPITAL_BASED_OUTPATIENT_CLINIC_OR_DEPARTMENT_OTHER): Payer: 59 | Admitting: Internal Medicine

## 2011-08-27 DIAGNOSIS — C9000 Multiple myeloma not having achieved remission: Secondary | ICD-10-CM

## 2011-08-27 DIAGNOSIS — Z7901 Long term (current) use of anticoagulants: Secondary | ICD-10-CM

## 2011-08-27 DIAGNOSIS — Z86718 Personal history of other venous thrombosis and embolism: Secondary | ICD-10-CM

## 2011-08-30 LAB — CROSSMATCH
ABO/RH(D): O POS
Antibody Screen: POSITIVE
DAT, IgG: NEGATIVE
Donor AG Type: NEGATIVE

## 2011-09-02 LAB — DIFFERENTIAL
Basophils Relative: 1
Eosinophils Absolute: 0
Monocytes Absolute: 0.7
Monocytes Relative: 13 — ABNORMAL HIGH
Neutrophils Relative %: 63

## 2011-09-02 LAB — CROSSMATCH
ABO/RH(D): O POS
DAT, IgG: NEGATIVE
Donor AG Type: NEGATIVE

## 2011-09-02 LAB — TISSUE HYBRIDIZATION (BONE MARROW)-NCBH

## 2011-09-02 LAB — CBC
Hemoglobin: 8.3 — ABNORMAL LOW
MCHC: 35.3
MCV: 91.5
RBC: 2.56 — ABNORMAL LOW
RDW: 17.5 — ABNORMAL HIGH

## 2011-09-04 LAB — BASIC METABOLIC PANEL
BUN: 10
Chloride: 103
Creatinine, Ser: 0.79
GFR calc non Af Amer: >60
Glucose, Bld: 90

## 2011-09-04 LAB — DIFFERENTIAL
Basophils Absolute: 0
Basophils Absolute: 0
Basophils Relative: 0
Basophils Relative: 1
Eosinophils Absolute: 0
Eosinophils Absolute: 0
Eosinophils Absolute: 0
Lymphs Abs: 0.6 — ABNORMAL LOW
Lymphs Abs: 0.8
Monocytes Relative: 12 — ABNORMAL HIGH
Monocytes Relative: 17 — ABNORMAL HIGH
Neutro Abs: 1.7
Neutro Abs: 2.5
Neutrophils Relative %: 60
Neutrophils Relative %: 61
Neutrophils Relative %: 68

## 2011-09-04 LAB — PREPARE RBC (CROSSMATCH)

## 2011-09-04 LAB — TYPE AND SCREEN

## 2011-09-04 LAB — URINE MICROSCOPIC-ADD ON

## 2011-09-04 LAB — ROCKY MTN SPOTTED FVR AB, IGM-BLOOD: RMSF IgM: 0.04

## 2011-09-04 LAB — CBC
Hemoglobin: 7.2 — CL
MCHC: 34.2
MCHC: 35
MCV: 90.2
MCV: 90.6
Platelets: 160
Platelets: 161
Platelets: 168
RBC: 3.02 — ABNORMAL LOW
RDW: 15.3 — ABNORMAL HIGH
RDW: 15.4 — ABNORMAL HIGH
WBC: 3.5 — ABNORMAL LOW
WBC: 3.5 — ABNORMAL LOW

## 2011-09-04 LAB — IRON AND TIBC
Iron: 49
Saturation Ratios: 28
TIBC: 176 — ABNORMAL LOW
UIBC: 127

## 2011-09-04 LAB — LACTATE DEHYDROGENASE: LDH: 175

## 2011-09-04 LAB — CULTURE, BLOOD (ROUTINE X 2): Culture: NO GROWTH

## 2011-09-04 LAB — COMPREHENSIVE METABOLIC PANEL
ALT: 17
AST: 21
Albumin: 2 — ABNORMAL LOW
Alkaline Phosphatase: 17 — ABNORMAL LOW
Calcium: 9
GFR calc Af Amer: >60
Glucose, Bld: 90
Potassium: 3.4 — ABNORMAL LOW
Sodium: 133 — ABNORMAL LOW
Total Protein: 10.6 — ABNORMAL HIGH

## 2011-09-04 LAB — I-STAT 8, (EC8 V) (CONVERTED LAB)
Acid-base deficit: 1
BUN: 17
Bicarbonate: 22.8
HCT: 28 — ABNORMAL LOW
Hemoglobin: 9.5 — ABNORMAL LOW
Operator id: 270651
Sodium: 138
TCO2: 24

## 2011-09-04 LAB — CSF CELL COUNT WITH DIFFERENTIAL: Tube #: 1

## 2011-09-04 LAB — URINALYSIS, ROUTINE W REFLEX MICROSCOPIC
Bilirubin Urine: NEGATIVE
Hgb urine dipstick: NEGATIVE
Nitrite: NEGATIVE
Specific Gravity, Urine: 1.018
pH: 6

## 2011-09-04 LAB — FERRITIN: Ferritin: 940 — ABNORMAL HIGH (ref 22–322)

## 2011-09-04 LAB — TSH: TSH: 1.084

## 2011-09-04 LAB — APTT: aPTT: 37

## 2011-09-04 LAB — HEMOGLOBIN AND HEMATOCRIT, BLOOD: Hemoglobin: 9.3 — ABNORMAL LOW

## 2011-09-04 LAB — ROCKY MTN SPOTTED FVR AB, IGG-BLOOD: RMSF IgG: 0.03 {ISR}

## 2011-09-04 LAB — PROTEIN AND GLUCOSE, CSF: Glucose, CSF: 51

## 2011-09-04 LAB — CSF CULTURE W GRAM STAIN

## 2011-09-04 LAB — PROTIME-INR: INR: 1.1

## 2011-09-24 ENCOUNTER — Other Ambulatory Visit: Payer: Self-pay | Admitting: Internal Medicine

## 2011-09-24 ENCOUNTER — Encounter: Payer: Self-pay | Admitting: Physician Assistant

## 2011-09-24 ENCOUNTER — Ambulatory Visit (HOSPITAL_BASED_OUTPATIENT_CLINIC_OR_DEPARTMENT_OTHER): Payer: 59 | Admitting: Physician Assistant

## 2011-09-24 ENCOUNTER — Other Ambulatory Visit (HOSPITAL_BASED_OUTPATIENT_CLINIC_OR_DEPARTMENT_OTHER): Payer: 59 | Admitting: Lab

## 2011-09-24 DIAGNOSIS — C9002 Multiple myeloma in relapse: Secondary | ICD-10-CM

## 2011-09-24 DIAGNOSIS — C9000 Multiple myeloma not having achieved remission: Secondary | ICD-10-CM | POA: Insufficient documentation

## 2011-09-24 LAB — CBC WITH DIFFERENTIAL/PLATELET
BASO%: 0.1 % (ref 0.0–2.0)
EOS%: 0 % (ref 0.0–7.0)
HCT: 41.3 % (ref 38.4–49.9)
MCH: 32 pg (ref 27.2–33.4)
MCHC: 34.4 g/dL (ref 32.0–36.0)
MONO#: 0.5 10*3/uL (ref 0.1–0.9)
RBC: 4.45 10*6/uL (ref 4.20–5.82)
RDW: 14.1 % (ref 11.0–14.6)
WBC: 10.4 10*3/uL — ABNORMAL HIGH (ref 4.0–10.3)
lymph#: 0.8 10*3/uL — ABNORMAL LOW (ref 0.9–3.3)

## 2011-09-24 LAB — COMPREHENSIVE METABOLIC PANEL
ALT: 31 U/L (ref 0–53)
AST: 17 U/L (ref 0–37)
CO2: 23 mEq/L (ref 19–32)
Calcium: 9.9 mg/dL (ref 8.4–10.5)
Chloride: 101 mEq/L (ref 96–112)
Potassium: 3.9 mEq/L (ref 3.5–5.3)
Sodium: 136 mEq/L (ref 135–145)
Total Protein: 7.8 g/dL (ref 6.0–8.3)

## 2011-09-24 LAB — LACTATE DEHYDROGENASE: LDH: 176 U/L (ref 94–250)

## 2011-09-24 NOTE — Progress Notes (Signed)
Hematology and Oncology Follow Up Visit  URI TURNBOUGH [id number] 11-Mar-1961 50 y.o. 09/24/2011 6:03 PM  Principle Diagnosis: #1 recurrent multiple myeloma IgG kappa subtype diagnosed in June of 2008 #2 history of vasculitis and thrombosis of  skin lesions   Prior Therapy: #1 status post palliative radiotherapy to the left hip under the care of Dr. Roselind Messier #2 status post 5 cycles of systemic chemotherapy with Revlimid and low dose Decadron. Last dose given June 2009 with good response. #3 status post autologous peripheral blood stem cell transplant at [facility] on 02/02/2008. #4 the patient had evidence for disease recurrence in December 2010.  Current therapy: #1 Revlimid 25 mg by mouth daily for 21 days every 4 weeks in addition to Decadron 40 mg orally on a weekly basis. The patient is status post 21 cycles and started cycle #22 on 09/23/2011. #2 Zometa 4 mg IV given every 3 months #3 Coumadin 2 mg by mouth daily for DVT prophylaxis  Interim History:  The patient presents for scheduled monthly followup visit. He continues to tolerate his therapy with Revlimid Decadron and prophylactic dose Coumadin without difficulty. He reports he is has cut down on his dietary protein intake. He has increased his ingestion of fruits and vegetables for snacks. He denies any problems with pain or night sweats. He has had no recurrences of the skin lesions on his abdomen. He voices no specific complaints today.  Medications: I have reviewed the patient's current medications.  Allergies: Not on File  Past Medical History, Surgical history, Social history, and Family History were reviewed and updated.  Review of Systems: Constitutional:  Negative for fever, chills, night sweats, anorexia, weight loss, pain. Cardiovascular: no chest pain or dyspnea on exertion Respiratory: no cough, shortness of breath, or wheezing Neurological: negative Dermatological: negative ENT: negative Skin  negative Gastrointestinal: no abdominal pain, change in bowel habits, or black or bloody stools Genito-Urinary: no dysuria, trouble voiding, or hematuria Hematological and Lymphatic: negative Breast: negative Musculoskeletal: negative Remaining ROS negative.  Physical Exam: Blood pressure 139/79, pulse 81, temperature 98.1 F (36.7 C), resp. rate 20, weight 254 lb 1.6 oz (115.259 kg). ECOG:  General appearance: alert, cooperative and no distress Head: Normocephalic, without obvious abnormality, atraumatic Mouth:no evidence of thrush or mucositis Neck: no adenopathy, no carotid bruit, no JVD, supple, symmetrical, trachea midline and thyroid not enlarged, symmetric, no tenderness/mass/nodules Lymph nodes: Cervical, supraclavicular, and axillary nodes normal. Resp: clear to auscultation bilaterally Cardio: regular rate and rhythm, S1, S2 normal, no murmur, click, rub or gallop GI: soft, non-tender; bowel sounds normal; no masses,  no organomegaly Extremities: extremities normal, atraumatic, no cyanosis or edema    Lab Results: Lab Results  Component Value Date   WBC 4.7 11/01/2009   HGB 14.2 09/24/2011   HCT 41.3 09/24/2011   MCV 93.0 09/24/2011   PLT 214 09/24/2011     Chemistry      Component Value Date/Time   NA 136 09/24/2011 1440   NA 136 09/24/2011 1440   K 3.9 09/24/2011 1440   K 3.9 09/24/2011 1440   CL 101 09/24/2011 1440   CL 101 09/24/2011 1440   CO2 23 09/24/2011 1440   CO2 23 09/24/2011 1440   BUN 13 09/24/2011 1440   BUN 13 09/24/2011 1440   CREATININE 0.63 09/24/2011 1440   CREATININE 0.63 09/24/2011 1440      Component Value Date/Time   CALCIUM 9.9 09/24/2011 1440   CALCIUM 9.9 09/24/2011 1440   ALKPHOS 27*  09/24/2011 1440   ALKPHOS 27* 09/24/2011 1440   AST 17 09/24/2011 1440   AST 17 09/24/2011 1440   ALT 31 09/24/2011 1440   ALT 31 09/24/2011 1440   BILITOT 0.8 09/24/2011 1440   BILITOT 0.8 09/24/2011 1440       Radiological Studies: chest  X-ray none  Impression and Plan: Is a very pleasant 50 year old African American male with recurrent multiple myeloma currently on treatment with Revlimid Decadron and low-dose prophylactic Coumadin. Overall is tolerating his treatment well. He has a mild increase on the free kappa light chain recently. He will continue on his current treatment and followup in one month with a repeat CBC differential C. met and LDH. We will continue to monitor his protein studies closely and adjust his therapy as needed. Patient was discussed with Dr. Arbutus Ped.  Spent more than half the time coordinating care.    Conni Slipper, PA-C 11/6/20126:03 PM

## 2011-10-07 ENCOUNTER — Other Ambulatory Visit: Payer: Self-pay | Admitting: Internal Medicine

## 2011-10-07 ENCOUNTER — Other Ambulatory Visit: Payer: Self-pay | Admitting: *Deleted

## 2011-10-07 MED ORDER — DEXAMETHASONE 4 MG PO TABS: 4.0000 mg | ORAL_TABLET | ORAL | Status: DC

## 2011-10-09 ENCOUNTER — Encounter: Payer: Self-pay | Admitting: *Deleted

## 2011-10-09 NOTE — Progress Notes (Signed)
[facility] faxed Revlimid refill request.  This request to MD for review.

## 2011-10-11 ENCOUNTER — Other Ambulatory Visit: Payer: Self-pay | Admitting: *Deleted

## 2011-10-11 DIAGNOSIS — C9000 Multiple myeloma not having achieved remission: Secondary | ICD-10-CM

## 2011-10-11 MED ORDER — LENALIDOMIDE 25 MG PO CAPS: 25.0000 mg | ORAL_CAPSULE | Freq: Every day | ORAL | Status: DC

## 2011-10-11 NOTE — Telephone Encounter (Signed)
NOTIFIED PT. TO COMPLETE HIS SURVEY. HE VOICES UNDERSTANDING. PHYSICIAN'S SURVEY COMPLETED. AUTHORIZATION #[id number].

## 2011-10-15 ENCOUNTER — Other Ambulatory Visit: Payer: Self-pay | Admitting: *Deleted

## 2011-10-15 ENCOUNTER — Other Ambulatory Visit: Payer: Self-pay | Admitting: Internal Medicine

## 2011-10-15 NOTE — Telephone Encounter (Signed)
RECEIVED A FAX FROM [facility] CONCERNING A PRESCRIPTION REFILL REQUEST FOR REVIMID. THIS REQUEST WAS GIVEN TO DR.MOHAMED'S NURSE, DIANE BELL,RN.

## 2011-10-15 NOTE — Telephone Encounter (Signed)
Received fax from [facility] for Revlimid refill-Sent to Dr. Donnald Garre for signature

## 2011-10-17 ENCOUNTER — Telehealth: Payer: Self-pay | Admitting: Internal Medicine

## 2011-10-17 ENCOUNTER — Encounter: Payer: Self-pay | Admitting: Internal Medicine

## 2011-10-17 DIAGNOSIS — R12 Heartburn: Secondary | ICD-10-CM

## 2011-10-17 NOTE — Telephone Encounter (Addendum)
Nexium rs was called to [facility]  & to pt.

## 2011-10-17 NOTE — Telephone Encounter (Signed)
revlimid authorization number [id number] was obtained on 10/10/10

## 2011-10-18 ENCOUNTER — Encounter: Payer: Self-pay | Admitting: Internal Medicine

## 2011-10-18 NOTE — Progress Notes (Signed)
Authorization  number faxed to [facility]

## 2011-10-22 ENCOUNTER — Ambulatory Visit (HOSPITAL_BASED_OUTPATIENT_CLINIC_OR_DEPARTMENT_OTHER): Payer: 59 | Admitting: Physician Assistant

## 2011-10-22 ENCOUNTER — Other Ambulatory Visit (HOSPITAL_BASED_OUTPATIENT_CLINIC_OR_DEPARTMENT_OTHER): Payer: 59 | Admitting: Lab

## 2011-10-22 ENCOUNTER — Encounter: Payer: Self-pay | Admitting: Physician Assistant

## 2011-10-22 ENCOUNTER — Telehealth: Payer: Self-pay | Admitting: Internal Medicine

## 2011-10-22 DIAGNOSIS — C9000 Multiple myeloma not having achieved remission: Secondary | ICD-10-CM

## 2011-10-22 LAB — COMPREHENSIVE METABOLIC PANEL
ALT: 36 U/L (ref 0–53)
AST: 21 U/L (ref 0–37)
Calcium: 9.8 mg/dL (ref 8.4–10.5)
Chloride: 100 mEq/L (ref 96–112)
Creatinine, Ser: 0.55 mg/dL (ref 0.50–1.35)
Total Bilirubin: 0.7 mg/dL (ref 0.3–1.2)

## 2011-10-22 LAB — CBC WITH DIFFERENTIAL/PLATELET
BASO%: 0 % (ref 0.0–2.0)
EOS%: 0 % (ref 0.0–7.0)
HCT: 38.9 % (ref 38.4–49.9)
MCH: 31.6 pg (ref 27.2–33.4)
MCHC: 35.7 g/dL (ref 32.0–36.0)
NEUT%: 81.5 % — ABNORMAL HIGH (ref 39.0–75.0)
RBC: 4.4 10*6/uL (ref 4.20–5.82)
lymph#: 0.8 10*3/uL — ABNORMAL LOW (ref 0.9–3.3)

## 2011-10-22 NOTE — Progress Notes (Signed)
Hematology and Oncology Follow Up Visit  [name] [id number] 03/30/1961 50 y.o. 10/22/2011 3:53 PM  Principle Diagnosis: #1 recurrent multiple myeloma IgG kappa subtype diagnosed in June of 2008 #2 history of vasculitis and thrombosis of  skin lesions   Prior Therapy: #1 status post palliative radiotherapy to the left hip under the care of Dr. Roselind Messier #2 status post 5 cycles of systemic chemotherapy with Revlimid and low dose Decadron. Last dose given June 2009 with good response. #3 status post autologous peripheral blood stem cell transplant at [facility] on 02/02/2008. #4 the patient had evidence for disease recurrence in December 2010.  Current therapy: #1 Revlimid 25 mg by mouth daily for 21 days every 4 weeks in addition to Decadron 40 mg orally on a weekly basis. The patient is status post 21 cycles and started cycle #22 on 09/23/2011. #2 Zometa 4 mg IV given every 3 months #3 Coumadin 2 mg by mouth daily for DVT prophylaxis  Interim History:  The patient presents for scheduled monthly followup visit. He continues to tolerate his therapy with Revlimid Decadron and prophylactic dose Coumadin without difficulty. He reports  He denies any problems with pain or night sweats. He has had no recurrences of the skin lesions on his abdomen. He did have a recurrence of some acid reflux and that has been relieved with a Nexium substitute. He voices no other specific complaints today.  Medications: I have reviewed the patient's current medications.  Allergies:  Allergies  Allergen Reactions  . Red Dye Anaphylaxis    Lips swollen  3-4 times their baseline size    Past Medical History, Surgical history, Social history, and Family History were reviewed and updated.  Review of Systems: Constitutional:  Negative for fever, chills, night sweats, anorexia, weight loss, pain. Cardiovascular: no chest pain or dyspnea on exertion Respiratory: no cough, shortness of breath, or  wheezing Neurological: negative Dermatological: negative ENT: negative Skin negative Gastrointestinal: no abdominal pain, change in bowel habits, or black or bloody stools Genito-Urinary: no dysuria, trouble voiding, or hematuria Hematological and Lymphatic: negative Breast: negative Musculoskeletal: negative Remaining ROS negative.  Physical Exam: Blood pressure 130/73, pulse 82, temperature 97.7 F (36.5 C), temperature source Oral, height 5\' 4"  (1.626 m), weight 257 lb (116.574 kg). ECOG:  General appearance: alert, cooperative and no distress Head: Normocephalic, without obvious abnormality, atraumatic Mouth:no evidence of thrush or mucositis Neck: no adenopathy, no carotid bruit, no JVD, supple, symmetrical, trachea midline and thyroid not enlarged, symmetric, no tenderness/mass/nodules Lymph nodes: Cervical, supraclavicular, and axillary nodes normal. Resp: clear to auscultation bilaterally Cardio: regular rate and rhythm, S1, S2 normal, no murmur, click, rub or gallop GI: soft, non-tender; bowel sounds normal; no masses,  no organomegaly Extremities: extremities normal, atraumatic, no cyanosis or edema    Lab Results: Lab Results  Component Value Date   WBC 6.7 10/22/2011   HGB 13.9 10/22/2011   HCT 38.9 10/22/2011   MCV 88.4 10/22/2011   PLT 235 10/22/2011     Chemistry      Component Value Date/Time   NA 136 09/24/2011 1440   NA 136 09/24/2011 1440   K 3.9 09/24/2011 1440   K 3.9 09/24/2011 1440   CL 101 09/24/2011 1440   CL 101 09/24/2011 1440   CO2 23 09/24/2011 1440   CO2 23 09/24/2011 1440   BUN 13 09/24/2011 1440   BUN 13 09/24/2011 1440   CREATININE 0.63 09/24/2011 1440   CREATININE 0.63 09/24/2011 1440  Component Value Date/Time   CALCIUM 9.9 09/24/2011 1440   CALCIUM 9.9 09/24/2011 1440   ALKPHOS 27* 09/24/2011 1440   ALKPHOS 27* 09/24/2011 1440   AST 17 09/24/2011 1440   AST 17 09/24/2011 1440   ALT 31 09/24/2011 1440   ALT 31 09/24/2011 1440   BILITOT 0.8  09/24/2011 1440   BILITOT 0.8 09/24/2011 1440       Radiological Studies: chest X-ray none  Impression and Plan: Is a very pleasant 50 year old African American male with recurrent multiple myeloma currently on treatment with Revlimid Decadron and low-dose prophylactic Coumadin. Overall is tolerating his treatment well. He has a mild increase on the free kappa light chain recently when his protein studies were last checked in October 2012.[name] He will continue on his current treatment and followup in one month with a repeat CBC differential C. met and LDH as well as repeat protein studies consisting of a quantitative immunoglobin, beta 2 microglobulin, and serum light chains.. We will continue to monitor his protein studies closely and adjust his therapy as needed. Patient was discussed with Dr. Arbutus Ped. He will be seen by Dr. Arbutus Ped in January approximately week after his protein studies are drawn to discuss the results of those studies and any changes at if necessary in his treatment.  Spent more than half the time coordinating care.    Conni Slipper, PA-C 12/4/20123:53 PM

## 2011-10-22 NOTE — Telephone Encounter (Signed)
gv pt appt schedule for jan °

## 2011-10-23 ENCOUNTER — Other Ambulatory Visit: Payer: Self-pay | Admitting: Physician Assistant

## 2011-11-02 ENCOUNTER — Other Ambulatory Visit: Payer: Self-pay | Admitting: Internal Medicine

## 2011-11-02 DIAGNOSIS — Z299 Encounter for prophylactic measures, unspecified: Secondary | ICD-10-CM

## 2011-11-07 ENCOUNTER — Other Ambulatory Visit: Payer: Self-pay | Admitting: *Deleted

## 2011-11-07 DIAGNOSIS — C9000 Multiple myeloma not having achieved remission: Secondary | ICD-10-CM

## 2011-11-07 MED ORDER — LENALIDOMIDE 25 MG PO CAPS: 25.0000 mg | ORAL_CAPSULE | Freq: Every day | ORAL | Status: DC

## 2011-11-07 NOTE — Telephone Encounter (Signed)
THIS REQUEST WAS GIVEN TO DR.MOHAMED'S NURSE, STEPHANIE JOHNSON,RN. 

## 2011-11-20 ENCOUNTER — Other Ambulatory Visit: Payer: Self-pay | Admitting: Physician Assistant

## 2011-11-20 ENCOUNTER — Other Ambulatory Visit (HOSPITAL_BASED_OUTPATIENT_CLINIC_OR_DEPARTMENT_OTHER): Payer: 59 | Admitting: Lab

## 2011-11-20 DIAGNOSIS — C9000 Multiple myeloma not having achieved remission: Secondary | ICD-10-CM

## 2011-11-20 LAB — CBC WITH DIFFERENTIAL/PLATELET
BASO%: 5.4 % — ABNORMAL HIGH (ref 0.0–2.0)
Basophils Absolute: 0.5 10*3/uL — ABNORMAL HIGH (ref 0.0–0.1)
EOS%: 1.3 % (ref 0.0–7.0)
Eosinophils Absolute: 0.1 10*3/uL (ref 0.0–0.5)
HCT: 37.9 % — ABNORMAL LOW (ref 38.4–49.9)
HGB: 13.5 g/dL (ref 13.0–17.1)
LYMPH%: 42.1 % (ref 14.0–49.0)
MCH: 31.5 pg (ref 27.2–33.4)
MCHC: 35.6 g/dL (ref 32.0–36.0)
MCV: 88.6 fL (ref 79.3–98.0)
MONO#: 1 10*3/uL — ABNORMAL HIGH (ref 0.1–0.9)
MONO%: 12.4 % (ref 0.0–14.0)
NEUT#: 3.2 10*3/uL (ref 1.5–6.5)
NEUT%: 38.8 % — ABNORMAL LOW (ref 39.0–75.0)
Platelets: 196 10*3/uL (ref 140–400)
RBC: 4.28 10*6/uL (ref 4.20–5.82)
RDW: 13.6 % (ref 11.0–14.6)
WBC: 8.3 10*3/uL (ref 4.0–10.3)
lymph#: 3.5 10*3/uL — ABNORMAL HIGH (ref 0.9–3.3)
nRBC: 0 % (ref 0–0)

## 2011-11-25 LAB — COMPREHENSIVE METABOLIC PANEL
Albumin: 3.8 g/dL (ref 3.5–5.2)
Alkaline Phosphatase: 28 U/L — ABNORMAL LOW (ref 39–117)
BUN: 17 mg/dL (ref 6–23)
Creatinine, Ser: 0.92 mg/dL (ref 0.50–1.35)
Glucose, Bld: 90 mg/dL (ref 70–99)
Total Bilirubin: 0.9 mg/dL (ref 0.3–1.2)

## 2011-11-25 LAB — KAPPA/LAMBDA LIGHT CHAINS
Kappa free light chain: 22.1 mg/dL — ABNORMAL HIGH (ref 0.33–1.94)
Kappa:Lambda Ratio: 12.49 — ABNORMAL HIGH (ref 0.26–1.65)
Lambda Free Lght Chn: 1.77 mg/dL (ref 0.57–2.63)

## 2011-11-25 LAB — SPEP & IFE WITH QIG
Beta 2: 3.5 % (ref 3.2–6.5)
Beta Globulin: 5.2 % (ref 4.7–7.2)
Gamma Globulin: 24.4 % — ABNORMAL HIGH (ref 11.1–18.8)
IgA: 82 mg/dL (ref 68–379)
IgG (Immunoglobin G), Serum: 2520 mg/dL — ABNORMAL HIGH (ref 650–1600)
IgM, Serum: 53 mg/dL (ref 41–251)
M-Spike, %: 1.74 g/dL

## 2011-11-25 LAB — BETA 2 MICROGLOBULIN, SERUM: Beta-2 Microglobulin: 3.04 mg/L — ABNORMAL HIGH (ref 1.01–1.73)

## 2011-11-26 ENCOUNTER — Encounter: Payer: Self-pay | Admitting: Physician Assistant

## 2011-11-26 ENCOUNTER — Ambulatory Visit: Payer: 59 | Admitting: Internal Medicine

## 2011-11-26 ENCOUNTER — Telehealth: Payer: Self-pay | Admitting: Internal Medicine

## 2011-11-26 ENCOUNTER — Ambulatory Visit (HOSPITAL_BASED_OUTPATIENT_CLINIC_OR_DEPARTMENT_OTHER): Payer: 59 | Admitting: Physician Assistant

## 2011-11-26 DIAGNOSIS — C9000 Multiple myeloma not having achieved remission: Secondary | ICD-10-CM

## 2011-11-26 NOTE — Telephone Encounter (Signed)
Pt saw Adrena today and she asked that I set the pt up for Zometa on 1/14 as well as a bone survey.  Pt set for 1:00 and 2:15 and is aware.    aom

## 2011-11-29 ENCOUNTER — Telehealth: Payer: Self-pay | Admitting: Internal Medicine

## 2011-11-29 NOTE — Telephone Encounter (Signed)
Talked to pt, he is aware of Bone Survey on 12/02/11, pt will come to see Dr. Arbutus Ped on 12/24/11 lab ,md and Zometa. Pt aware of all appts.

## 2011-11-29 NOTE — Progress Notes (Signed)
Hematology and Oncology Follow Up Visit  [name] [id number] 04-05-1961 51 y.o. 11/29/2011 5:28 PM  Principle Diagnosis: #1 recurrent multiple myeloma IgG kappa subtype diagnosed in June of 2008 #2 history of vasculitis and thrombosis of  skin lesions   Prior Therapy: #1 status post palliative radiotherapy to the left hip under the care of Dr. Roselind Messier #2 status post 5 cycles of systemic chemotherapy with Revlimid and low dose Decadron. Last dose given June 2009 with good response. #3 status post autologous peripheral blood stem cell transplant at [facility] on 02/02/2008. #4 the patient had evidence for disease recurrence in December 2010.  Current therapy: 1. Revlimid 25 mg by mouth daily for 21 days every 4 weeks in addition to Decadron 40 mg orally on a weekly basis. The patient is status post 23cycles and started cycle #24 on 11/20/2011. 2. Zometa 4 mg IV given every 3 months 3. Coumadin 2 mg by mouth daily for DVT prophylaxis  Interim History:  The patient presents for scheduled monthly followup visit. He continues to tolerate his therapy with Revlimid Decadron and prophylactic dose Coumadin without difficulty. Today he is feeling fine he voices no complaints at all. He recently had his restaging protein studies done and is here to discuss the results.   Medications: I have reviewed the patient's current medications.  Allergies:  Allergies  Allergen Reactions  . Red Dye Anaphylaxis    Lips swollen  3-4 times their baseline size    Past Medical History, Surgical history, Social history, and Family History were reviewed and updated.  Review of Systems: Constitutional:  Negative for fever, chills, night sweats, anorexia, weight loss, pain. Cardiovascular: no chest pain or dyspnea on exertion Respiratory: no cough, shortness of breath, or wheezing Neurological: negative Dermatological: negative ENT: negative Gastrointestinal: no abdominal pain, change in bowel habits,  or black or bloody stools Genito-Urinary: no dysuria, trouble voiding, or hematuria Hematological and Lymphatic: negative Musculoskeletal: negative Remaining ROS negative.  Physical Exam: Blood pressure 134/76, pulse 100, temperature 97.7 F (36.5 C), temperature source Oral, height 5\' 4"  (1.626 m), weight 259 lb 1.6 oz (117.527 kg). ECOG: 0-1 General appearance: alert, cooperative and no distress Head: Normocephalic, without obvious abnormality, atraumatic Mouth:no evidence of thrush or mucositis Neck: no adenopathy, no carotid bruit, no JVD, supple, symmetrical, trachea midline and thyroid not enlarged, symmetric, no tenderness/mass/nodules Lymph nodes: Cervical, supraclavicular, and axillary nodes normal. Resp: clear to auscultation bilaterally Cardio: regular rate and rhythm, S1, S2 normal, no murmur, click, rub or gallop GI: soft, non-tender; bowel sounds normal; no masses,  no organomegaly Extremities: extremities normal, atraumatic, no cyanosis or edema    Lab Results: Lab Results  Component Value Date   WBC 8.3 11/20/2011   HGB 13.5 11/20/2011   HCT 37.9* 11/20/2011   MCV 88.6 11/20/2011   PLT 196 11/20/2011     Chemistry      Component Value Date/Time   NA 126* 11/20/2011 1442   K 4.4 11/20/2011 1442   CL 94* 11/20/2011 1442   CO2 19 11/20/2011 1442   BUN 17 11/20/2011 1442   CREATININE 0.92 11/20/2011 1442      Component Value Date/Time   CALCIUM 9.4 11/20/2011 1442   ALKPHOS 28* 11/20/2011 1442   AST 39* 11/20/2011 1442   ALT 50 11/20/2011 1442   BILITOT 0.9 11/20/2011 1442       Radiological Studies: chest X-ray none  Impression and Plan: Is a very pleasant 51 year old [place] American male with recurrent  multiple myeloma currently on treatment with Revlimid Decadron and low-dose prophylactic Coumadin. Overall is tolerating his treatment well. The patient was discussed with Dr. Darrold Span in Dr. Asa Lente absence. He's had an increase in his kappa free light chains, lambda free light  chains as well as the kappa lambda ratio, the beta-2 microglobulin is also increased as is his IgG, alpha-1 globulin and alpha 2 globulin and gammaglobulin with an M spike of 1.74%. We will obtain a skeletal survey and have him followup with Dr. Arbutus Ped in one month to discuss further treatment options. He is due for his Zometa infusion and we will schedule this at the earliest opening.    [name] Slipper, PA-C 11/29/2011

## 2011-12-02 ENCOUNTER — Ambulatory Visit: Payer: 59

## 2011-12-02 ENCOUNTER — Ambulatory Visit (HOSPITAL_COMMUNITY)
Admission: RE | Admit: 2011-12-02 | Discharge: 2011-12-02 | Disposition: A | Discharge status: Home / Self Care | Payer: 59 | Source: Ambulatory Visit | Attending: Physician Assistant | Admitting: Physician Assistant

## 2011-12-02 DIAGNOSIS — C9000 Multiple myeloma not having achieved remission: Secondary | ICD-10-CM | POA: Insufficient documentation

## 2011-12-02 DIAGNOSIS — M47817 Spondylosis without myelopathy or radiculopathy, lumbosacral region: Secondary | ICD-10-CM | POA: Insufficient documentation

## 2011-12-06 ENCOUNTER — Other Ambulatory Visit: Payer: Self-pay | Admitting: *Deleted

## 2011-12-06 NOTE — Telephone Encounter (Signed)
THIS REQUEST WAS GIVEN TO DR.MOHAMED'S NURSE, STEPHANIE JOHNSON,RN. 

## 2011-12-09 ENCOUNTER — Encounter: Payer: Self-pay | Admitting: Internal Medicine

## 2011-12-09 NOTE — Progress Notes (Unsigned)
authorization number [id number] for revlimind and rx faxed to [facility]

## 2011-12-24 ENCOUNTER — Ambulatory Visit (HOSPITAL_BASED_OUTPATIENT_CLINIC_OR_DEPARTMENT_OTHER): Payer: 59 | Admitting: Internal Medicine

## 2011-12-24 ENCOUNTER — Other Ambulatory Visit (HOSPITAL_BASED_OUTPATIENT_CLINIC_OR_DEPARTMENT_OTHER): Payer: 59 | Admitting: Lab

## 2011-12-24 ENCOUNTER — Telehealth: Payer: Self-pay | Admitting: Internal Medicine

## 2011-12-24 ENCOUNTER — Ambulatory Visit (HOSPITAL_BASED_OUTPATIENT_CLINIC_OR_DEPARTMENT_OTHER): Payer: 59

## 2011-12-24 DIAGNOSIS — C9002 Multiple myeloma in relapse: Secondary | ICD-10-CM

## 2011-12-24 DIAGNOSIS — C9000 Multiple myeloma not having achieved remission: Secondary | ICD-10-CM

## 2011-12-24 LAB — CBC WITH DIFFERENTIAL/PLATELET
BASO%: 0.6 % (ref 0.0–2.0)
MCH: 31.8 pg (ref 27.2–33.4)
MCV: 90.9 fL (ref 79.3–98.0)
MONO#: 0.5 10*3/uL (ref 0.1–0.9)
NEUT%: 61.8 % (ref 39.0–75.0)
Platelets: 179 10*3/uL (ref 140–400)
RBC: 4.18 10*6/uL — ABNORMAL LOW (ref 4.20–5.82)
lymph#: 1.3 10*3/uL (ref 0.9–3.3)

## 2011-12-24 LAB — COMPREHENSIVE METABOLIC PANEL
Albumin: 3.6 g/dL (ref 3.5–5.2)
BUN: 8 mg/dL (ref 6–23)
Calcium: 10.2 mg/dL (ref 8.4–10.5)
Chloride: 98 mEq/L (ref 96–112)
Glucose, Bld: 93 mg/dL (ref 70–99)
Potassium: 3.8 mEq/L (ref 3.5–5.3)
Total Protein: 8.4 g/dL — ABNORMAL HIGH (ref 6.0–8.3)

## 2011-12-24 MED ORDER — SODIUM CHLORIDE 0.9 % IV SOLN: Freq: Once | INTRAVENOUS | Status: DC

## 2011-12-24 MED ORDER — ZOLEDRONIC ACID 4 MG/100ML IV SOLN
4.0000 mg | Freq: Once | INTRAVENOUS | Status: AC
  Administered 2011-12-24: 4 mg via INTRAVENOUS
  Filled 2011-12-24: qty 100

## 2011-12-24 MED ORDER — HEPARIN SOD (PORK) LOCK FLUSH 100 UNIT/ML IV SOLN
250.0000 [IU] | Freq: Once | INTRAVENOUS | Status: DC | PRN
  Filled 2011-12-24: qty 5

## 2011-12-24 NOTE — Telephone Encounter (Signed)
appt made and printed for 01/21/12   aom °

## 2011-12-24 NOTE — Progress Notes (Signed)
[facility] OFFICE PROGRESS NOTE  Principle Diagnosis: #1 recurrent multiple myeloma IgG kappa subtype diagnosed in June of 2008 #2 history of vasculitis and thrombosis of skin lesions   Prior Therapy: #1 status post palliative radiotherapy to the left hip under the care of Dr. Roselind Messier #2 status post 5 cycles of systemic chemotherapy with Revlimid and low dose Decadron. Last dose given June 2009 with good response. #3 status post autologous peripheral blood stem cell transplant at [facility] on 02/02/2008. #4 the patient had evidence for disease recurrence in December 2010.   Current therapy:  1. Revlimid 25 mg by mouth daily for 21 days every 4 weeks in addition to Decadron 40 mg orally on a weekly basis. The patient is status post 24cycles and started cycle #25 on 12/18/2011.  2. Zometa 4 mg IV given every 3 months  3. Coumadin 2 mg by mouth daily for DVT prophylaxis   INTERVAL HISTORY: [name] 51 y.o. male returns to the clinic today for followup visit accompanied his wife. The patient is currently asymptomatic. He continues to work full-time. He denied having any significant weight loss or night sweats. No constipation or peripheral neuropathy. He has no chest pain or shortness of breath. He has repeat CBC, comprehensive metabolic panel, LDH and myeloma panel performed last month in addition to skeletal bone survey. He is here today for evaluation and discussion of his lab and imaging studies.  MEDICAL HISTORY: Past Medical History  Diagnosis Date  . Multiple myeloma 09/24/2011    ALLERGIES:  is allergic to red dye.  MEDICATIONS:  Current Outpatient Prescriptions  Medication Sig Dispense Refill  . dexamethasone (DECADRON) 4 MG tablet TAKE 10 TABLETS BY MOUTH EVERY WEEK STARTING WITH CHEMO  40 tablet  3  . dexamethasone (DECADRON) 4 MG tablet Take 1 tablet (4 mg total) by mouth as directed. 10 tabs every week with chemo.  40 tablet  4  .  HYDROcodone-acetaminophen (VICODIN) 5-500 MG per tablet Take 1 tablet by mouth every 6 (six) hours as needed.        [name] lenalidomide (REVLIMID) 25 MG capsule Take 1 capsule (25 mg total) by mouth daily. 1 tab po for 21 days every 28 days  21 capsule  0  . lisinopril-hydrochlorothiazide (PRINZIDE,ZESTORETIC) 20-12.5 MG per tablet Take 1 tablet by mouth daily.        [name] omeprazole (PRILOSEC) 40 MG capsule       . warfarin (COUMADIN) 2 MG tablet TAKE 1 TABLET BY MOUTH DAILY  30 tablet  3   No current facility-administered medications for this visit.   Facility-Administered Medications Ordered in Other Visits  Medication Dose Route Frequency Provider Last Rate Last Dose  . 0.9 %  sodium chloride infusion   Intravenous Once [employer], PA      . heparin lock flush 100 unit/mL  250 Units Intracatheter Once PRN [name], PA      . Zoledronic Acid (ZOMETA) 4 mg IVPB  4 mg Intravenous Once Cassondra Stachowski K. Rolanda Campa, MD 400 mL/hr at 12/24/11 1627 4 mg at 12/24/11 1627    REVIEW OF SYSTEMS:  A comprehensive review of systems was negative.   PHYSICAL EXAMINATION: General appearance: alert, cooperative and no distress Lymph nodes: Cervical, supraclavicular, and axillary nodes normal. Resp: clear to auscultation bilaterally Cardio: regular rate and rhythm, S1, S2 normal, no murmur, click, rub or gallop GI: soft, non-tender; bowel sounds normal; no masses,  no organomegaly Extremities: extremities normal, atraumatic, no cyanosis or edema Neurologic: Alert and oriented X 3, normal strength and tone. Normal symmetric reflexes. Normal coordination and gait  ECOG PERFORMANCE STATUS: 0 - Asymptomatic  Blood pressure 146/90, pulse 110, temperature 97.1 F (36.2 C), temperature source Oral, height 5\' 4"  (1.626 m), weight 257 lb 12.8 oz (116.937 kg).  LABORATORY DATA: Lab Results  Component Value Date   WBC 5.2 12/24/2011   HGB 13.3 12/24/2011   HCT 38.0* 12/24/2011   MCV 90.9 12/24/2011   PLT 179 12/24/2011       Chemistry      Component Value Date/Time   NA 126* 11/20/2011 1442   K 4.4 11/20/2011 1442   CL 94* 11/20/2011 1442   CO2 19 11/20/2011 1442   BUN 17 11/20/2011 1442   CREATININE 0.92 11/20/2011 1442      Component Value Date/Time   CALCIUM 9.4 11/20/2011 1442   ALKPHOS 28* 11/20/2011 1442   AST 39* 11/20/2011 1442   ALT 50 11/20/2011 1442   BILITOT 0.9 11/20/2011 1442     Other lab results: Beta-2 microglobulin 3.04, LDH 278, free kappa light chain 22.10, free lambda light chain 1.77, kappa/lambda ratio 12.49, IgG 2520, IgA 82, IgM 53.  RADIOGRAPHIC STUDIES: Dg Bone Survey Met  12/02/2011  *RADIOLOGY REPORT*  Clinical Data: Multiple myeloma.  Restaging.  METASTATIC BONE SURVEY  Comparison: 05/25/2007  Findings: Lateral skull one-view:  Marked improvement in the pattern when compared to 2008.  There are a very few small lucencies which are indeterminate.  These could be residua of the previous disease, evidence of recurrent disease or venous lucencies.  Cervical spine two views:  Limited visualization because of shoulder density.  Previously seen lucency and C6 and 2008 cannot be evaluated because of that.  Thoracic spine two views:  Old minor inferior end plate deformity of T2.  No evidence of new lesion in the thoracic spine.  Lumbar spine two views:  No fracture or lytic lesion.  AP pelvis:  There is been development of degenerative disease of the sacroiliac joints.  There are new lucencies in the sacrum and scattered about the bony pelvis that could be residua of previous disease or evidence of recurrent disease.  Difficult to compare given the time interval.  Right femur two views:  No lytic lesions.  Left femur two views:  No lytic lesions.  Right shoulder and humerus two views:  No lytic lesions.  Left shoulder and humerus two views:  No lytic lesions.  IMPRESSION: Compared to the examination of 2008, multiple lytic foci in the skull are markedly improved.  There are a few small lucencies in the  skull that could represent residua of the previous disease, minor active disease, or normal venous lucencies.  Can not to accurately evaluate the previously seen lucency within the C6 vertebral body because of overlying shoulder density.  Apparent worsening of lucencies in the bony pelvis compared to 2008.  Again, this could represent residua of treated disease or evidence of active pelvic involvement  Original Report Authenticated By: Thomasenia Sales, M.D.    ASSESSMENT: This is a very pleasant 51 years old [place] American male with recurrent multiple myeloma. He is currently on treatment with Revlimid and Decadron. The patient is tolerating his treatment fairly well but he has some evidence for mild disease progression. I discussed the lab result and imaging studies with the patient and his wife and showed them the images of the skeletal bone survey.  PLAN: I gave the patient the option of continuing on the chemotherapy with the same regimen for now and monitoring his myeloma panel closely versus switching to a different regimen including subcutaneous Velcade versus Velcade, Doxil and Decadron. The patient would like to continue on his current treatment. I will repeat myeloma panel in 2 months. If she has any further evidence for disease progression I would consider switching him to treatment with subcutaneous Velcade as the first preference for this patient because of his work schedule. The patient will receive his Zometa injection today. He would come back for followup visit in one month's for reevaluation.   All questions were answered. The patient knows to call the clinic with any problems, questions or concerns. We can certainly see the patient much sooner if necessary.  I spent 20 minutes counseling the patient face to face. The total time spent in the appointment was 40 minutes.

## 2012-01-01 ENCOUNTER — Other Ambulatory Visit: Payer: Self-pay

## 2012-01-01 DIAGNOSIS — C9000 Multiple myeloma not having achieved remission: Secondary | ICD-10-CM

## 2012-01-01 MED ORDER — LENALIDOMIDE 25 MG PO CAPS: 25.0000 mg | ORAL_CAPSULE | Freq: Every day | ORAL | Status: DC

## 2012-01-21 ENCOUNTER — Ambulatory Visit: Payer: 59 | Admitting: Physician Assistant

## 2012-01-21 ENCOUNTER — Other Ambulatory Visit: Payer: 59 | Admitting: Lab

## 2012-01-21 ENCOUNTER — Ambulatory Visit: Payer: 59

## 2012-01-21 ENCOUNTER — Encounter: Payer: Self-pay | Admitting: Physician Assistant

## 2012-01-21 ENCOUNTER — Telehealth: Payer: Self-pay | Admitting: Internal Medicine

## 2012-01-21 ENCOUNTER — Other Ambulatory Visit (HOSPITAL_BASED_OUTPATIENT_CLINIC_OR_DEPARTMENT_OTHER): Payer: 59 | Admitting: Lab

## 2012-01-21 DIAGNOSIS — D649 Anemia, unspecified: Secondary | ICD-10-CM

## 2012-01-21 DIAGNOSIS — C9000 Multiple myeloma not having achieved remission: Secondary | ICD-10-CM

## 2012-01-21 LAB — CBC WITH DIFFERENTIAL/PLATELET
BASO%: 0.4 % (ref 0.0–2.0)
EOS%: 3 % (ref 0.0–7.0)
HCT: 37.2 % — ABNORMAL LOW (ref 38.4–49.9)
LYMPH%: 20.6 % (ref 14.0–49.0)
MCH: 32.8 pg (ref 27.2–33.4)
MCHC: 34 g/dL (ref 32.0–36.0)
NEUT%: 65.6 % (ref 39.0–75.0)
Platelets: 201 10*3/uL (ref 140–400)
lymph#: 0.9 10*3/uL (ref 0.9–3.3)

## 2012-01-21 LAB — COMPREHENSIVE METABOLIC PANEL
ALT: 34 U/L (ref 0–53)
AST: 23 U/L (ref 0–37)
Alkaline Phosphatase: 28 U/L — ABNORMAL LOW (ref 39–117)
Creatinine, Ser: 0.72 mg/dL (ref 0.50–1.35)
Total Bilirubin: 0.9 mg/dL (ref 0.3–1.2)

## 2012-01-21 NOTE — Telephone Encounter (Signed)
gv pt appt for april2013 

## 2012-01-23 NOTE — Progress Notes (Signed)
[facility] OFFICE PROGRESS NOTE  Principle Diagnosis: #1 recurrent multiple myeloma IgG kappa subtype diagnosed in June of 2008 #2 history of vasculitis and thrombosis of skin lesions   Prior Therapy: #1 status post palliative radiotherapy to the left hip under the care of Dr. Roselind Messier #2 status post 5 cycles of systemic chemotherapy with Revlimid and low dose Decadron. Last dose given June 2009 with good response. #3 status post autologous peripheral blood stem cell transplant at [facility] on 02/02/2008. #4 the patient had evidence for disease recurrence in December 2010.   Current therapy:  1. Revlimid 25 mg by mouth daily for 21 days every 4 weeks in addition to Decadron 40 mg orally on a weekly basis. The patient is status post 25 cycles and started cycle #26  on 01/15/2012.  2. Zometa 4 mg IV given every 3 months  3. Coumadin 2 mg by mouth daily for DVT prophylaxis   INTERVAL HISTORY: BURRIS MATHERNE 51 y.o. male returns to the clinic today for followup visit accompanied his wife. He voices no complaints today. He continues to work full-time. He denied having any significant weight loss or night sweats. No constipation or peripheral neuropathy. He has no chest pain or shortness of breath. He has started the first of two subsequent cycles of Revlimid on 01/15/2012. After he completes the second cycle, protein studies will be checked again to reevaluate his disease.  MEDICAL HISTORY: Past Medical History  Diagnosis Date  . Multiple myeloma 09/24/2011    ALLERGIES:  is allergic to red dye.  MEDICATIONS:  Current Outpatient Prescriptions  Medication Sig Dispense Refill  . dexamethasone (DECADRON) 4 MG tablet TAKE 10 TABLETS BY MOUTH EVERY WEEK STARTING WITH CHEMO  40 tablet  3  . dexamethasone (DECADRON) 4 MG tablet Take 1 tablet (4 mg total) by mouth as directed. 10 tabs every week with chemo.  40 tablet  4  . HYDROcodone-acetaminophen (VICODIN) 5-500 MG per  tablet Take 1 tablet by mouth every 6 (six) hours as needed.        [name] lenalidomide (REVLIMID) 25 MG capsule Take 1 capsule (25 mg total) by mouth daily. 1 tab po for 21 days every 28 days Auth # - [id number] on 2/13.  21 capsule  0  . lisinopril-hydrochlorothiazide (PRINZIDE,ZESTORETIC) 20-12.5 MG per tablet Take 1 tablet by mouth daily.        [name] omeprazole (PRILOSEC) 40 MG capsule       . warfarin (COUMADIN) 2 MG tablet TAKE 1 TABLET BY MOUTH DAILY  30 tablet  3    REVIEW OF SYSTEMS:  A comprehensive review of systems was negative.   PHYSICAL EXAMINATION: General appearance: alert, cooperative and no distress Lymph nodes: Cervical, supraclavicular, and axillary nodes normal. Resp: clear to auscultation bilaterally Cardio: regular rate and rhythm, S1, S2 normal, no murmur, click, rub or gallop GI: soft, non-tender; bowel sounds normal; no masses,  no organomegaly Extremities: extremities normal, atraumatic, no cyanosis or edema Neurologic: Alert and oriented X 3, normal strength and tone. Normal symmetric reflexes. Normal coordination and gait  ECOG PERFORMANCE STATUS: 0 - Asymptomatic  Blood pressure 135/80, pulse 94, temperature 97.1 F (36.2 C), temperature source Oral, height 5\' 4"  (1.626 m), weight 261 lb 11.2 oz (118.706 kg).  LABORATORY DATA: Lab Results  Component Value Date   WBC 4.3 01/21/2012   HGB 12.7* 01/21/2012   HCT 37.2* 01/21/2012   MCV 96.3 01/21/2012  PLT 201 01/21/2012      Chemistry      Component Value Date/Time   NA 134* 01/21/2012 1446   K 3.7 01/21/2012 1446   CL 98 01/21/2012 1446   CO2 28 01/21/2012 1446   BUN 7 01/21/2012 1446   CREATININE 0.72 01/21/2012 1446      Component Value Date/Time   CALCIUM 9.8 01/21/2012 1446   ALKPHOS 28* 01/21/2012 1446   AST 23 01/21/2012 1446   ALT 34 01/21/2012 1446   BILITOT 0.9 01/21/2012 1446     Other lab results: Beta-2 microglobulin 3.04, LDH 278, free kappa light chain 22.10, free lambda light chain 1.77, kappa/lambda ratio 12.49,  IgG 2520, IgA 82, IgM 53.  RADIOGRAPHIC STUDIES: Dg Bone Survey Met  12/02/2011  *RADIOLOGY REPORT*  Clinical Data: Multiple myeloma.  Restaging.  METASTATIC BONE SURVEY  Comparison: 05/25/2007  Findings: Lateral skull one-view:  Marked improvement in the pattern when compared to 2008.  There are a very few small lucencies which are indeterminate.  These could be residua of the previous disease, evidence of recurrent disease or venous lucencies.  Cervical spine two views:  Limited visualization because of shoulder density.  Previously seen lucency and C6 and 2008 cannot be evaluated because of that.  Thoracic spine two views:  Old minor inferior end plate deformity of T2.  No evidence of new lesion in the thoracic spine.  Lumbar spine two views:  No fracture or lytic lesion.  AP pelvis:  There is been development of degenerative disease of the sacroiliac joints.  There are new lucencies in the sacrum and scattered about the bony pelvis that could be residua of previous disease or evidence of recurrent disease.  Difficult to compare given the time interval.  Right femur two views:  No lytic lesions.  Left femur two views:  No lytic lesions.  Right shoulder and humerus two views:  No lytic lesions.  Left shoulder and humerus two views:  No lytic lesions.  IMPRESSION: Compared to the examination of 2008, multiple lytic foci in the skull are markedly improved.  There are a few small lucencies in the skull that could represent residua of the previous disease, minor active disease, or normal venous lucencies.  Can not to accurately evaluate the previously seen lucency within the C6 vertebral body because of overlying shoulder density.  Apparent worsening of lucencies in the bony pelvis compared to 2008.  Again, this could represent residua of treated disease or evidence of active pelvic involvement  Original Report Authenticated By: Thomasenia Sales, M.D.    ASSESSMENT/PLAN: This is a very pleasant 51 years old  [place] American male with recurrent multiple myeloma. He is currently on treatment with Revlimid and Decadron. The patient is tolerating his treatment fairly well but he has some evidence for mild disease progression. The patient was discussed with Dr. Darrold Span in Dr. Asa Lente absence. He will continue with his Revlimid therapy as planned for 2 more cycles, then we will repeat his protein studies to re-evaluate his disease. He'll return in one month with a repeat CBC differential, C. met and LDH. He does not requiring prescription refills today.  Laural Benes, Zamyiah Tino E, PA-C   All questions were answered. The patient knows to call the clinic with any problems, questions or concerns. We can certainly see the patient much sooner if necessary.

## 2012-01-29 ENCOUNTER — Encounter: Payer: Self-pay | Admitting: *Deleted

## 2012-01-29 ENCOUNTER — Other Ambulatory Visit: Payer: Self-pay | Admitting: Medical Oncology

## 2012-01-29 DIAGNOSIS — C9000 Multiple myeloma not having achieved remission: Secondary | ICD-10-CM

## 2012-01-29 MED ORDER — LENALIDOMIDE 25 MG PO CAPS: 25.0000 mg | ORAL_CAPSULE | Freq: Every day | ORAL | Status: DC

## 2012-01-29 NOTE — Progress Notes (Signed)
[facility] faxed Revlimid refill request.  Request to MD for review.  

## 2012-01-29 NOTE — Telephone Encounter (Signed)
auth number obtained and sent to Dr Donnald Garre for signature

## 2012-01-30 ENCOUNTER — Other Ambulatory Visit: Payer: Self-pay | Admitting: *Deleted

## 2012-02-12 ENCOUNTER — Telehealth: Payer: Self-pay | Admitting: Medical Oncology

## 2012-02-12 MED ORDER — LEVOFLOXACIN 500 MG PO TABS: 500.0000 mg | ORAL_TABLET | Freq: Every day | ORAL | Status: AC

## 2012-02-12 NOTE — Telephone Encounter (Signed)
Reports chest congestion , runny nose , scratchy throat x 1 week . Coughing up yellow sputum. His primary care is not available today . Per Adrena i called in z pack . Per red dye allergy I called [facility] and cancelled z pack and per [name] I  called in levaquin. Pt notified

## 2012-02-18 ENCOUNTER — Other Ambulatory Visit (HOSPITAL_BASED_OUTPATIENT_CLINIC_OR_DEPARTMENT_OTHER): Payer: 59 | Admitting: Lab

## 2012-02-18 ENCOUNTER — Encounter: Payer: Self-pay | Admitting: Physician Assistant

## 2012-02-18 ENCOUNTER — Ambulatory Visit (HOSPITAL_BASED_OUTPATIENT_CLINIC_OR_DEPARTMENT_OTHER): Payer: 59 | Admitting: Physician Assistant

## 2012-02-18 ENCOUNTER — Telehealth: Payer: Self-pay | Admitting: Internal Medicine

## 2012-02-18 DIAGNOSIS — C9000 Multiple myeloma not having achieved remission: Secondary | ICD-10-CM

## 2012-02-18 DIAGNOSIS — K219 Gastro-esophageal reflux disease without esophagitis: Secondary | ICD-10-CM

## 2012-02-18 LAB — CBC WITH DIFFERENTIAL/PLATELET
Basophils Absolute: 0 10*3/uL (ref 0.0–0.1)
EOS%: 3.3 % (ref 0.0–7.0)
HCT: 34.6 % — ABNORMAL LOW (ref 38.4–49.9)
HGB: 11.9 g/dL — ABNORMAL LOW (ref 13.0–17.1)
MCH: 33.1 pg (ref 27.2–33.4)
MCV: 96.4 fL (ref 79.3–98.0)
MONO%: 8.4 % (ref 0.0–14.0)
NEUT%: 69.9 % (ref 39.0–75.0)
lymph#: 1.1 10*3/uL (ref 0.9–3.3)

## 2012-02-18 LAB — COMPREHENSIVE METABOLIC PANEL
Albumin: 3.6 g/dL (ref 3.5–5.2)
Alkaline Phosphatase: 25 U/L — ABNORMAL LOW (ref 39–117)
BUN: 10 mg/dL (ref 6–23)
CO2: 27 mEq/L (ref 19–32)
Glucose, Bld: 97 mg/dL (ref 70–99)
Potassium: 3.9 mEq/L (ref 3.5–5.3)

## 2012-02-18 LAB — LACTATE DEHYDROGENASE: LDH: 177 U/L (ref 94–250)

## 2012-02-18 MED ORDER — OMEPRAZOLE 40 MG PO CPDR: 40.0000 mg | DELAYED_RELEASE_CAPSULE | Freq: Every day | ORAL | Status: DC

## 2012-02-18 NOTE — Telephone Encounter (Signed)
gve the pt his April 2013 appt calendar 

## 2012-02-18 NOTE — Progress Notes (Signed)
[facility] OFFICE PROGRESS NOTE  Principle Diagnosis: #1 recurrent multiple myeloma IgG kappa subtype diagnosed in June of 2008 #2 history of vasculitis and thrombosis of skin lesions   Prior Therapy: #1 status post palliative radiotherapy to the left hip under the care of Dr. Roselind Messier #2 status post 5 cycles of systemic chemotherapy with Revlimid and low dose Decadron. Last dose given June 2009 with good response. #3 status post autologous peripheral blood stem cell transplant at [facility] on 02/02/2008. #4 the patient had evidence for disease recurrence in December 2010.   Current therapy:  1. Revlimid 25 mg by mouth daily for 21 days every 4 weeks in addition to Decadron 40 mg orally on a weekly basis. The patient is status post 26 cycles and started cycle #27  on 02/12/2012.  2. Zometa 4 mg IV given every 3 months  3. Coumadin 2 mg by mouth daily for DVT prophylaxis   INTERVAL HISTORY: [name] 51 y.o. male returns to the clinic today for followup visit accompanied his wife, Inetta Fermo. He had some difficulty with sinus congestion that improved after a course of antibiotics. Today he complains of dry skin particularly in the area where he had painful vasculitis on his abdomen. He also has some wartlike lesions on his right second finger. He's had some intermittent pain in his left hip. He is unable to lay on his left side and this has been this way since his diagnosis.  He continues to work full-time. He denied having any significant weight loss or night sweats. No constipation or peripheral neuropathy. He has no chest pain or shortness of breath. He has started the first of two subsequent cycles of Revlimid on 01/15/2012. After he completes this second cycle, protein studies will be checked again to reevaluate his disease.  MEDICAL HISTORY: Past Medical History  Diagnosis Date  . Multiple myeloma 09/24/2011    ALLERGIES:  is allergic to red dye.  MEDICATIONS:    Current Outpatient Prescriptions  Medication Sig Dispense Refill  . dexamethasone (DECADRON) 4 MG tablet TAKE 10 TABLETS BY MOUTH EVERY WEEK STARTING WITH CHEMO  40 tablet  3  . dexamethasone (DECADRON) 4 MG tablet Take 1 tablet (4 mg total) by mouth as directed. 10 tabs every week with chemo.  40 tablet  4  . HYDROcodone-acetaminophen (VICODIN) 5-500 MG per tablet Take 1 tablet by mouth every 6 (six) hours as needed.        [name] lenalidomide (REVLIMID) 25 MG capsule Take 1 capsule (25 mg total) by mouth daily. 1 tab po for 21 days every 28 days Auth # - [id number] on 2/13.  21 capsule  0  . levofloxacin (LEVAQUIN) 500 MG tablet Take 1 tablet (500 mg total) by mouth daily.  5 tablet  0  . lisinopril-hydrochlorothiazide (PRINZIDE,ZESTORETIC) 20-12.5 MG per tablet Take 1 tablet by mouth daily.        [name] omeprazole (PRILOSEC) 40 MG capsule Take 1 capsule (40 mg total) by mouth daily.  30 capsule  3  . warfarin (COUMADIN) 2 MG tablet TAKE 1 TABLET BY MOUTH DAILY  30 tablet  3  . DISCONTD: omeprazole (PRILOSEC) 40 MG capsule         REVIEW OF SYSTEMS:  A comprehensive review of systems was negative.   PHYSICAL EXAMINATION: General appearance: alert, cooperative and no distress Lymph nodes: Cervical, supraclavicular, and axillary nodes normal. Resp: clear to auscultation bilaterally Cardio: regular  rate and rhythm, S1, S2 normal, no murmur, click, rub or gallop GI: soft, non-tender; bowel sounds normal; no masses,  no organomegaly Extremities: extremities normal, atraumatic, no cyanosis or edema Neurologic: Alert and oriented X 3, normal strength and tone. Normal symmetric reflexes. Normal coordination and gait Skin: The scar tissue from the vasculitis on his abdomen is very dry and flaking, no broken skin. Lateral aspect of the second finger on the right hand reveals several small of punctate wartlike lesions  ECOG PERFORMANCE STATUS: 0 - Asymptomatic  Blood pressure 127/77, pulse 98, temperature  97.5 F (36.4 C), temperature source Oral, height 5\' 4"  (1.626 m), weight 259 lb (117.482 kg).  LABORATORY DATA: Lab Results  Component Value Date   WBC 6.3 02/18/2012   HGB 11.9* 02/18/2012   HCT 34.6* 02/18/2012   MCV 96.4 02/18/2012   PLT 191 02/18/2012      Chemistry      Component Value Date/Time   NA 134* 01/21/2012 1446   K 3.7 01/21/2012 1446   CL 98 01/21/2012 1446   CO2 28 01/21/2012 1446   BUN 7 01/21/2012 1446   CREATININE 0.72 01/21/2012 1446      Component Value Date/Time   CALCIUM 9.8 01/21/2012 1446   ALKPHOS 28* 01/21/2012 1446   AST 23 01/21/2012 1446   ALT 34 01/21/2012 1446   BILITOT 0.9 01/21/2012 1446     Other lab results: Beta-2 microglobulin 3.04, LDH 278, free kappa light chain 22.10, free lambda light chain 1.77, kappa/lambda ratio 12.49, IgG 2520, IgA 82, IgM 53.  RADIOGRAPHIC STUDIES: Dg Bone Survey Met  12/02/2011  *RADIOLOGY REPORT*  Clinical Data: Multiple myeloma.  Restaging.  METASTATIC BONE SURVEY  Comparison: 05/25/2007  Findings: Lateral skull one-view:  Marked improvement in the pattern when compared to 2008.  There are a very few small lucencies which are indeterminate.  These could be residua of the previous disease, evidence of recurrent disease or venous lucencies.  Cervical spine two views:  Limited visualization because of shoulder density.  Previously seen lucency and C6 and 2008 cannot be evaluated because of that.  Thoracic spine two views:  Old minor inferior end plate deformity of T2.  No evidence of new lesion in the thoracic spine.  Lumbar spine two views:  No fracture or lytic lesion.  AP pelvis:  There is been development of degenerative disease of the sacroiliac joints.  There are new lucencies in the sacrum and scattered about the bony pelvis that could be residua of previous disease or evidence of recurrent disease.  Difficult to compare given the time interval.  Right femur two views:  No lytic lesions.  Left femur two views:  No lytic lesions.  Right  shoulder and humerus two views:  No lytic lesions.  Left shoulder and humerus two views:  No lytic lesions.  IMPRESSION: Compared to the examination of 2008, multiple lytic foci in the skull are markedly improved.  There are a few small lucencies in the skull that could represent residua of the previous disease, minor active disease, or normal venous lucencies.  Can not to accurately evaluate the previously seen lucency within the C6 vertebral body because of overlying shoulder density.  Apparent worsening of lucencies in the bony pelvis compared to 2008.  Again, this could represent residua of treated disease or evidence of active pelvic involvement  Original Report Authenticated By: Thomasenia Sales, M.D.    ASSESSMENT/PLAN: This is a very pleasant 51 years old [place] American male  with recurrent multiple myeloma. He is currently on treatment with Revlimid and Decadron. The patient is tolerating his treatment fairly well but he has some evidence for mild disease progression. The patient was discussed with Dr. Arbutus Ped. He will continue with his Revlimid therapy as planned.  We will repeat his protein studies to re-evaluate his disease in 2 weeks. He'll return in 3 weeks to discuss the results of the followup protein studies and to discuss any needed changes in therapy. To address his dry skin, he was given samples of Aquaphor. To address the wartlike lesions on his finger he was suggested he try an over-the-counter products such as compound W.  Laural Benes, Tyrus Wilms E, PA-C   All questions were answered. The patient knows to call the clinic with any problems, questions or concerns. We can certainly see the patient much sooner if necessary.

## 2012-02-20 ENCOUNTER — Other Ambulatory Visit: Payer: Self-pay | Admitting: Internal Medicine

## 2012-02-24 ENCOUNTER — Other Ambulatory Visit: Payer: Self-pay | Admitting: Internal Medicine

## 2012-02-24 DIAGNOSIS — Z299 Encounter for prophylactic measures, unspecified: Secondary | ICD-10-CM

## 2012-02-26 ENCOUNTER — Other Ambulatory Visit: Payer: Self-pay | Admitting: *Deleted

## 2012-02-26 NOTE — Telephone Encounter (Signed)
Refill request to MD desk 

## 2012-03-03 ENCOUNTER — Other Ambulatory Visit (HOSPITAL_BASED_OUTPATIENT_CLINIC_OR_DEPARTMENT_OTHER): Payer: 59 | Admitting: Lab

## 2012-03-03 DIAGNOSIS — C9000 Multiple myeloma not having achieved remission: Secondary | ICD-10-CM

## 2012-03-03 LAB — CBC WITH DIFFERENTIAL/PLATELET
BASO%: 0.6 % (ref 0.0–2.0)
EOS%: 8.3 % — ABNORMAL HIGH (ref 0.0–7.0)
LYMPH%: 24 % (ref 14.0–49.0)
MCH: 33.9 pg — ABNORMAL HIGH (ref 27.2–33.4)
MCHC: 34.9 g/dL (ref 32.0–36.0)
MCV: 97 fL (ref 79.3–98.0)
MONO%: 11.8 % (ref 0.0–14.0)
NEUT#: 2.4 10*3/uL (ref 1.5–6.5)
Platelets: 175 10*3/uL (ref 140–400)
RBC: 3.54 10*6/uL — ABNORMAL LOW (ref 4.20–5.82)
RDW: 15 % — ABNORMAL HIGH (ref 11.0–14.6)
nRBC: 0 % (ref 0–0)

## 2012-03-05 LAB — COMPREHENSIVE METABOLIC PANEL
ALT: 31 U/L (ref 0–53)
Alkaline Phosphatase: 24 U/L — ABNORMAL LOW (ref 39–117)
CO2: 23 mEq/L (ref 19–32)
Creatinine, Ser: 0.59 mg/dL (ref 0.50–1.35)
Sodium: 131 mEq/L — ABNORMAL LOW (ref 135–145)
Total Bilirubin: 1 mg/dL (ref 0.3–1.2)
Total Protein: 9.1 g/dL — ABNORMAL HIGH (ref 6.0–8.3)

## 2012-03-05 LAB — LACTATE DEHYDROGENASE: LDH: 190 U/L (ref 94–250)

## 2012-03-05 LAB — KAPPA/LAMBDA LIGHT CHAINS: Kappa:Lambda Ratio: 610 — ABNORMAL HIGH (ref 0.26–1.65)

## 2012-03-05 LAB — SPEP & IFE WITH QIG
Alpha-2-Globulin: 10.3 % (ref 7.1–11.8)
Beta 2: 2.2 % — ABNORMAL LOW (ref 3.2–6.5)
Beta Globulin: 4.9 % (ref 4.7–7.2)
Gamma Globulin: 36.8 % — ABNORMAL HIGH (ref 11.1–18.8)
IgA: 52 mg/dL — ABNORMAL LOW (ref 68–379)
IgG (Immunoglobin G), Serum: 3740 mg/dL — ABNORMAL HIGH (ref 650–1600)
M-Spike, %: 3.2 g/dL
Total Protein, Serum Electrophoresis: 9.1 g/dL — ABNORMAL HIGH (ref 6.0–8.3)

## 2012-03-05 LAB — BETA 2 MICROGLOBULIN, SERUM: Beta-2 Microglobulin: 2.71 mg/L — ABNORMAL HIGH (ref 1.01–1.73)

## 2012-03-10 ENCOUNTER — Ambulatory Visit (HOSPITAL_BASED_OUTPATIENT_CLINIC_OR_DEPARTMENT_OTHER): Payer: 59 | Admitting: Internal Medicine

## 2012-03-10 DIAGNOSIS — C9000 Multiple myeloma not having achieved remission: Secondary | ICD-10-CM

## 2012-03-10 MED ORDER — AZITHROMYCIN 250 MG PO TABS: ORAL_TABLET | ORAL | Status: AC

## 2012-03-10 NOTE — Progress Notes (Signed)
[name] Telephone:([phone]) [phone]   [phone]  OFFICE PROGRESS NOTE  [name], MD, MD [address] [place] [address]  Principle Diagnosis: #1 recurrent multiple myeloma IgG kappa subtype diagnosed in June of 2008 #2 history of vasculitis and thrombosis of skin lesions   Prior Therapy: #1 status post palliative radiotherapy to the left hip under the care of Dr. Roselind Messier  #2 status post 5 cycles of systemic chemotherapy with Revlimid and low dose Decadron. Last dose given June 2009 with good response.  #3 status post autologous peripheral blood stem cell transplant at [facility] on 02/02/2008. #4 the patient had evidence for disease recurrence in December 2010.  #5 Revlimid 25 mg by mouth daily for 21 days every 4 weeks in addition to Decadron 40 mg orally on a weekly basis. The patient is status post 27 cycles, discontinued today secondary to disease progression.   Current therapy:  Zometa 4 mg IV given every 3 months   INTERVAL HISTORY: [name] 51 y.o. male returns to the clinic today for followup visit accompanied by his wife. The patient is tolerating his treatment with Revlimid and Decadron fairly well. He has been complaining recently of sinus infection as well as upper respiratory infection. He took some over-the-counter medication with no improvement. He denied having any significant fever or chills. No significant weight loss or night sweats. No nausea or vomiting. He has repeat CBC, comprehensive metabolic panel, LDH and myeloma panel performed recently and he is here today for evaluation and discussion of his lab results.  MEDICAL HISTORY: Past Medical History  Diagnosis Date  . Multiple myeloma 09/24/2011    ALLERGIES:  is allergic to red dye.  MEDICATIONS:  Current Outpatient Prescriptions  Medication Sig Dispense Refill  . dexamethasone (DECADRON) 4 MG tablet Take 1 tablet (4 mg total) by mouth  as directed. 10 tabs every week with chemo.  40 tablet  4  . dexamethasone (DECADRON) 4 MG tablet TAKE 10 TABLETS BY MOUTH EVERY WEEK WITH CHEMO  40 tablet  4  . HYDROcodone-acetaminophen (VICODIN) 5-500 MG per tablet Take 1 tablet by mouth every 6 (six) hours as needed.        [name] lenalidomide (REVLIMID) 25 MG capsule Take 1 capsule (25 mg total) by mouth daily. 1 tab po for 21 days every 28 days Auth # - [id number] on 2/13.  21 capsule  0  . lisinopril-hydrochlorothiazide (PRINZIDE,ZESTORETIC) 20-12.5 MG per tablet Take 1 tablet by mouth daily.        [name] omeprazole (PRILOSEC) 40 MG capsule Take 1 capsule (40 mg total) by mouth daily.  30 capsule  3  . warfarin (COUMADIN) 2 MG tablet TAKE 1 TABLET BY MOUTH DAILY  30 tablet  3  . azithromycin (ZITHROMAX) 250 MG tablet Use as instructed  6 each  0    REVIEW OF SYSTEMS:  A comprehensive review of systems was negative except for: Constitutional: positive for fatigue Respiratory: positive for chronic bronchitis and cough   PHYSICAL EXAMINATION: General appearance: alert, cooperative and no distress Head: Normocephalic, without obvious abnormality, atraumatic Neck: no adenopathy Lymph nodes: Cervical, supraclavicular, and axillary nodes normal. Resp: clear to auscultation bilaterally Cardio: regular rate and rhythm, S1, S2 normal, no murmur, click, rub or gallop GI: soft, non-tender; bowel sounds normal; no masses,  no organomegaly Extremities: extremities normal, atraumatic, no cyanosis or edema Neurologic: Alert and oriented X 3, normal strength and tone.  Normal symmetric reflexes. Normal coordination and gait  ECOG PERFORMANCE STATUS: 1 - Symptomatic but completely ambulatory  Blood pressure 138/79, pulse 90, temperature 99.3 F (37.4 C), temperature source Oral, weight 255 lb 6.4 oz (115.849 kg).  LABORATORY DATA: Lab Results  Component Value Date   WBC 4.3 03/03/2012   HGB 12.0* 03/03/2012   HCT 34.3* 03/03/2012   MCV 97.0 03/03/2012   PLT  175 03/03/2012      Chemistry      Component Value Date/Time   NA 131* 03/03/2012 1344   K 3.4* 03/03/2012 1344   CL 101 03/03/2012 1344   CO2 23 03/03/2012 1344   BUN 8 03/03/2012 1344   CREATININE 0.59 03/03/2012 1344      Component Value Date/Time   CALCIUM 9.1 03/03/2012 1344   ALKPHOS 24* 03/03/2012 1344   AST 21 03/03/2012 1344   ALT 31 03/03/2012 1344   BILITOT 1.0 03/03/2012 1344     Other results: Beta-2 microglobulin 2.71, free kappa light chain 73.20, free lambda light chain 0.12, kappa/lambda ratio 610.00, IgG 3740, IgA 52 and IgM 19.  RADIOGRAPHIC STUDIES: No results found.  ASSESSMENT: This is a very pleasant 51 years old African American male with recurrent multiple myeloma most recently treated with Revlimid and Decadron status post 27 cycles. Unfortunately the patient has evidence for disease progression based on the recent myeloma panel.   PLAN: I discussed you that result with the patient and his wife. I recommended for him the following: #1 would discontinue his treatment with Revlimid and prophylactic Coumadin. #2 I would consider the patient for systemic chemotherapy with Velcade 1.3 mg/M2 on days 1, 4, 8 and 11 in addition to Doxil 30 mg/M2 on day 4 and Decadron 40 mg by mouth on weekly basis every 3 weeks. I discussed with the patient adverse effect of this treatment including but not limited to alopecia, myelosuppression, cardiac dysfunction, nausea and vomiting, peripheral neuropathy, liver or renal dysfunction. The patient expected to start the first cycle of this treatment on 03/23/2012. #3 for the questionable sinusitis infection and bronchitis, I started the patient on Z-Pak. #4 the patient would come back for followup visit in one month's for reevaluation and management any adverse effect of his chemotherapy. He was advised to call immediately if he has any concerning symptoms in the interval.  All questions were answered. The patient knows to call the clinic  with any problems, questions or concerns. We can certainly see the patient much sooner if necessary.  I spent 20 minutes counseling the patient face to face. The total time spent in the appointment was 35 minutes.

## 2012-03-12 ENCOUNTER — Telehealth: Payer: Self-pay | Admitting: Internal Medicine

## 2012-03-12 ENCOUNTER — Other Ambulatory Visit: Payer: Self-pay | Admitting: *Deleted

## 2012-03-12 NOTE — Telephone Encounter (Signed)
s/w wife and she is aware of 5/2 and all other appts and will p/u a new sch at 5/6 appt

## 2012-03-19 ENCOUNTER — Ambulatory Visit (HOSPITAL_COMMUNITY)
Admission: RE | Admit: 2012-03-19 | Discharge: 2012-03-19 | Disposition: A | Discharge status: Home / Self Care | Payer: 59 | Source: Ambulatory Visit | Attending: Internal Medicine | Admitting: Internal Medicine

## 2012-03-19 DIAGNOSIS — I079 Rheumatic tricuspid valve disease, unspecified: Secondary | ICD-10-CM | POA: Insufficient documentation

## 2012-03-19 DIAGNOSIS — I10 Essential (primary) hypertension: Secondary | ICD-10-CM | POA: Insufficient documentation

## 2012-03-19 DIAGNOSIS — Z01818 Encounter for other preprocedural examination: Secondary | ICD-10-CM | POA: Insufficient documentation

## 2012-03-19 DIAGNOSIS — C9000 Multiple myeloma not having achieved remission: Secondary | ICD-10-CM | POA: Insufficient documentation

## 2012-03-19 DIAGNOSIS — I369 Nonrheumatic tricuspid valve disorder, unspecified: Secondary | ICD-10-CM

## 2012-03-19 NOTE — Progress Notes (Signed)
  Echocardiogram 2D Echocardiogram has been performed.  Lamya Lausch L 03/19/2012, 3:28 PM 

## 2012-03-23 ENCOUNTER — Other Ambulatory Visit: Payer: Self-pay | Admitting: Medical Oncology

## 2012-03-23 ENCOUNTER — Ambulatory Visit (HOSPITAL_BASED_OUTPATIENT_CLINIC_OR_DEPARTMENT_OTHER): Payer: 59

## 2012-03-23 ENCOUNTER — Other Ambulatory Visit: Payer: Self-pay | Admitting: Internal Medicine

## 2012-03-23 ENCOUNTER — Other Ambulatory Visit (HOSPITAL_BASED_OUTPATIENT_CLINIC_OR_DEPARTMENT_OTHER): Payer: 59

## 2012-03-23 DIAGNOSIS — C9000 Multiple myeloma not having achieved remission: Secondary | ICD-10-CM

## 2012-03-23 DIAGNOSIS — Z5112 Encounter for antineoplastic immunotherapy: Secondary | ICD-10-CM

## 2012-03-23 DIAGNOSIS — R110 Nausea: Secondary | ICD-10-CM

## 2012-03-23 LAB — CBC WITH DIFFERENTIAL/PLATELET
BASO%: 0.4 % (ref 0.0–2.0)
LYMPH%: 17.8 % (ref 14.0–49.0)
MCHC: 34.4 g/dL (ref 32.0–36.0)
MCV: 92.1 fL (ref 79.3–98.0)
MONO%: 11.4 % (ref 0.0–14.0)
Platelets: 207 10*3/uL (ref 140–400)
RBC: 3.66 10*6/uL — ABNORMAL LOW (ref 4.20–5.82)
WBC: 5.7 10*3/uL (ref 4.0–10.3)
nRBC: 0 % (ref 0–0)

## 2012-03-23 LAB — COMPREHENSIVE METABOLIC PANEL
ALT: 25 U/L (ref 0–53)
AST: 19 U/L (ref 0–37)
Alkaline Phosphatase: 23 U/L — ABNORMAL LOW (ref 39–117)
Creatinine, Ser: 0.68 mg/dL (ref 0.50–1.35)
Total Bilirubin: 0.5 mg/dL (ref 0.3–1.2)

## 2012-03-23 MED ORDER — ZOLEDRONIC ACID 4 MG/100ML IV SOLN
4.0000 mg | Freq: Once | INTRAVENOUS | Status: AC
  Administered 2012-03-23: 4 mg via INTRAVENOUS
  Filled 2012-03-23: qty 100

## 2012-03-23 MED ORDER — SODIUM CHLORIDE 0.9 % IV SOLN
Freq: Once | INTRAVENOUS | Status: AC
  Administered 2012-03-23: 15:00:00 via INTRAVENOUS

## 2012-03-23 MED ORDER — ONDANSETRON 8 MG/50ML IVPB (CHCC)
8.0000 mg | Freq: Once | INTRAVENOUS | Status: AC
  Administered 2012-03-23: 8 mg via INTRAVENOUS

## 2012-03-23 MED ORDER — BORTEZOMIB CHEMO SQ INJECTION 3.5 MG (2.5MG/ML)
1.3000 mg/m2 | Freq: Once | INTRAMUSCULAR | Status: AC
  Administered 2012-03-23: 3 mg via SUBCUTANEOUS
  Filled 2012-03-23: qty 3

## 2012-03-23 MED ORDER — ONDANSETRON HCL 8 MG PO TABS: 8.0000 mg | ORAL_TABLET | Freq: Three times a day (TID) | ORAL | Status: AC | PRN

## 2012-03-23 MED ORDER — SODIUM CHLORIDE 0.9 % IV SOLN: Freq: Once | INTRAVENOUS | Status: DC

## 2012-03-23 NOTE — Telephone Encounter (Signed)
Called in zofran prn . Pt cannot take compazine due to red dye allergy causing anaphylaxis

## 2012-03-23 NOTE — Patient Instructions (Signed)
[facility] Discharge Instructions for Patients Receiving Chemotherapy  Today you received the following chemotherapy agents Velcade and Zometa To help prevent nausea and vomiting after your treatment, we encourage you to take your nausea medication as prescribed. If you develop nausea and vomiting that is not controlled by your nausea medication, call the clinic. If it is after clinic hours your family physician or the after hours number for the clinic or go to the Emergency Department.   BELOW ARE SYMPTOMS THAT SHOULD BE REPORTED IMMEDIATELY:  *FEVER GREATER THAN 100.5 F  *CHILLS WITH OR WITHOUT FEVER  NAUSEA AND VOMITING THAT IS NOT CONTROLLED WITH YOUR NAUSEA MEDICATION  *UNUSUAL SHORTNESS OF BREATH  *UNUSUAL BRUISING OR BLEEDING  TENDERNESS IN MOUTH AND THROAT WITH OR WITHOUT PRESENCE OF ULCERS  *URINARY PROBLEMS  *BOWEL PROBLEMS  UNUSUAL RASH Items with * indicate a potential emergency and should be followed up as soon as possible.  One of the nurses will contact you 24 hours after your treatment. Please let the nurse know about any problems that you may have experienced. Feel free to call the clinic you have any questions or concerns. The clinic phone number is [phone].   I have been informed and understand all the instructions given to me. I know to contact the clinic, my physician, or go to the Emergency Department if any problems should occur. I do not have any questions at this time, but understand that I may call the clinic during office hours   should I have any questions or need assistance in obtaining follow up care.    __________________________________________  _____________  __________ Signature of Patient or Authorized Representative            Date                   Time    __________________________________________ Nurse's Signature

## 2012-03-24 ENCOUNTER — Telehealth: Payer: Self-pay | Admitting: Medical Oncology

## 2012-03-24 NOTE — Telephone Encounter (Signed)
Needs to clarify when he takes the decadron 40 mg . I told him once a week  . He voices understanding

## 2012-03-25 ENCOUNTER — Other Ambulatory Visit: Payer: Self-pay | Admitting: *Deleted

## 2012-03-25 ENCOUNTER — Other Ambulatory Visit: Payer: Self-pay | Admitting: Medical Oncology

## 2012-03-25 DIAGNOSIS — I776 Arteritis, unspecified: Secondary | ICD-10-CM

## 2012-03-25 MED ORDER — CLOBETASOL PROPIONATE 0.05 % EX CREA: TOPICAL_CREAM | Freq: Two times a day (BID) | CUTANEOUS | Status: DC

## 2012-03-25 NOTE — Telephone Encounter (Signed)
Called in rx for clobetasol to pharmacy and pt notified

## 2012-03-25 NOTE — Telephone Encounter (Signed)
Request for refill on revlimid.   Medication discontinued per Dr. Asa Lente office note from 03/10/12.  Request faxed back with denial for refill

## 2012-03-26 ENCOUNTER — Ambulatory Visit (HOSPITAL_BASED_OUTPATIENT_CLINIC_OR_DEPARTMENT_OTHER): Payer: 59

## 2012-03-26 ENCOUNTER — Other Ambulatory Visit: Payer: 59

## 2012-03-26 DIAGNOSIS — C9000 Multiple myeloma not having achieved remission: Secondary | ICD-10-CM

## 2012-03-26 DIAGNOSIS — Z5111 Encounter for antineoplastic chemotherapy: Secondary | ICD-10-CM

## 2012-03-26 DIAGNOSIS — Z5112 Encounter for antineoplastic immunotherapy: Secondary | ICD-10-CM

## 2012-03-26 MED ORDER — ONDANSETRON 16 MG/50ML IVPB (CHCC)
16.0000 mg | Freq: Once | INTRAVENOUS | Status: AC
  Administered 2012-03-26: 16 mg via INTRAVENOUS

## 2012-03-26 MED ORDER — SODIUM CHLORIDE 0.9 % IV SOLN
Freq: Once | INTRAVENOUS | Status: AC
  Administered 2012-03-26: 14:00:00 via INTRAVENOUS

## 2012-03-26 MED ORDER — DOXORUBICIN HCL LIPOSOMAL CHEMO INJECTION 2 MG/ML
30.0000 mg/m2 | Freq: Once | INTRAVENOUS | Status: AC
  Administered 2012-03-26: 68 mg via INTRAVENOUS
  Filled 2012-03-26: qty 34

## 2012-03-26 MED ORDER — BORTEZOMIB CHEMO SQ INJECTION 3.5 MG (2.5MG/ML)
1.3000 mg/m2 | Freq: Once | INTRAMUSCULAR | Status: AC
  Administered 2012-03-26: 3 mg via SUBCUTANEOUS
  Filled 2012-03-26: qty 3

## 2012-03-27 ENCOUNTER — Telehealth: Payer: Self-pay | Admitting: *Deleted

## 2012-03-27 NOTE — Telephone Encounter (Signed)
Denies any adverse event from his chemo yesterday.

## 2012-03-30 ENCOUNTER — Telehealth: Payer: Self-pay | Admitting: Oncology

## 2012-03-30 ENCOUNTER — Other Ambulatory Visit (HOSPITAL_BASED_OUTPATIENT_CLINIC_OR_DEPARTMENT_OTHER): Payer: 59 | Admitting: Lab

## 2012-03-30 ENCOUNTER — Other Ambulatory Visit: Payer: Self-pay | Admitting: Medical Oncology

## 2012-03-30 ENCOUNTER — Ambulatory Visit (HOSPITAL_BASED_OUTPATIENT_CLINIC_OR_DEPARTMENT_OTHER): Payer: 59

## 2012-03-30 DIAGNOSIS — C9000 Multiple myeloma not having achieved remission: Secondary | ICD-10-CM

## 2012-03-30 DIAGNOSIS — Z5112 Encounter for antineoplastic immunotherapy: Secondary | ICD-10-CM

## 2012-03-30 LAB — CBC WITH DIFFERENTIAL/PLATELET
BASO%: 0.3 % (ref 0.0–2.0)
Basophils Absolute: 0 10*3/uL (ref 0.0–0.1)
EOS%: 1.7 % (ref 0.0–7.0)
HGB: 11.9 g/dL — ABNORMAL LOW (ref 13.0–17.1)
MCH: 33.3 pg (ref 27.2–33.4)
MCHC: 34.3 g/dL (ref 32.0–36.0)
MCV: 97.1 fL (ref 79.3–98.0)
MONO%: 15.3 % — ABNORMAL HIGH (ref 0.0–14.0)
RBC: 3.58 10*6/uL — ABNORMAL LOW (ref 4.20–5.82)
RDW: 16 % — ABNORMAL HIGH (ref 11.0–14.6)

## 2012-03-30 LAB — COMPREHENSIVE METABOLIC PANEL
AST: 20 U/L (ref 0–37)
Albumin: 3.5 g/dL (ref 3.5–5.2)
Alkaline Phosphatase: 22 U/L — ABNORMAL LOW (ref 39–117)
Glucose, Bld: 98 mg/dL (ref 70–99)
Potassium: 3.6 mEq/L (ref 3.5–5.3)
Sodium: 134 mEq/L — ABNORMAL LOW (ref 135–145)
Total Bilirubin: 0.4 mg/dL (ref 0.3–1.2)
Total Protein: 9.5 g/dL — ABNORMAL HIGH (ref 6.0–8.3)

## 2012-03-30 MED ORDER — ONDANSETRON 8 MG/50ML IVPB (CHCC)
8.0000 mg | Freq: Once | INTRAVENOUS | Status: AC
  Administered 2012-03-30: 8 mg via INTRAVENOUS

## 2012-03-30 MED ORDER — BORTEZOMIB CHEMO SQ INJECTION 3.5 MG (2.5MG/ML)
1.3000 mg/m2 | Freq: Once | INTRAMUSCULAR | Status: AC
  Administered 2012-03-30: 3 mg via SUBCUTANEOUS
  Filled 2012-03-30: qty 3

## 2012-03-30 NOTE — Progress Notes (Signed)
Call md for problems 

## 2012-03-30 NOTE — Telephone Encounter (Signed)
On call: wife called due to temp 100.4. CBC with velcade/doxil today has ANC 3.0. No localizing symptoms of infection, feels cold but no shaking chills. OK to give tylenol. If localizing symptoms or shaking chills to call back.

## 2012-03-31 ENCOUNTER — Other Ambulatory Visit: Payer: Self-pay | Admitting: Pharmacist

## 2012-03-31 ENCOUNTER — Telehealth: Payer: Self-pay | Admitting: Internal Medicine

## 2012-03-31 ENCOUNTER — Encounter: Payer: Self-pay | Admitting: Physician Assistant

## 2012-03-31 ENCOUNTER — Ambulatory Visit (HOSPITAL_BASED_OUTPATIENT_CLINIC_OR_DEPARTMENT_OTHER): Payer: 59 | Admitting: Physician Assistant

## 2012-03-31 DIAGNOSIS — I776 Arteritis, unspecified: Secondary | ICD-10-CM

## 2012-03-31 DIAGNOSIS — L959 Vasculitis limited to the skin, unspecified: Secondary | ICD-10-CM

## 2012-03-31 DIAGNOSIS — C9000 Multiple myeloma not having achieved remission: Secondary | ICD-10-CM

## 2012-03-31 NOTE — Telephone Encounter (Signed)
Gave pt calendar for 04/09/12

## 2012-03-31 NOTE — Progress Notes (Signed)
Patient called wanting to know if there is anything else he could use other than his prescribed cream on the vasculitis on his abdomen. Patient was discussed with Dr. Gwenyth Bouillon and we'll arrange for him to come in today for evaluation.  Laural Benes, Amalee Olsen E

## 2012-04-01 MED ORDER — HYDROCODONE-ACETAMINOPHEN 5-500 MG PO TABS: 1.0000 | ORAL_TABLET | Freq: Four times a day (QID) | ORAL | Status: AC | PRN

## 2012-04-01 MED ORDER — FONDAPARINUX SODIUM 10 MG/0.8ML ~~LOC~~ SOLN: 10.0000 mg | SUBCUTANEOUS | Status: DC

## 2012-04-01 MED ORDER — WARFARIN SODIUM 5 MG PO TABS: ORAL_TABLET | ORAL | Status: DC

## 2012-04-01 NOTE — Progress Notes (Signed)
[facility] [phone]   [phone]  OFFICE PROGRESS NOTE  [name], MD, MD [address] [place] [address]  Principle Diagnosis: #1 recurrent multiple myeloma IgG kappa subtype diagnosed in June of 2008 #2 history of vasculitis and thrombosis of skin lesions   Prior Therapy: #1 status post palliative radiotherapy to the left hip under the care of Dr. Roselind Messier  #2 status post 5 cycles of systemic chemotherapy with Revlimid and low dose Decadron. Last dose given June 2009 with good response.  #3 status post autologous peripheral blood stem cell transplant at [facility] on 02/02/2008. #4 the patient had evidence for disease recurrence in December 2010.  #5 Revlimid 25 mg by mouth daily for 21 days every 4 weeks in addition to Decadron 40 mg orally on a weekly basis. The patient is status post 27 cycles, discontinued today secondary to disease progression.   Current therapy:  1. Zometa 4 mg IV given every 3 months  2. systemic chemotherapy with Velcade 1.3 mg/M2 on days 1, 4, 8 and 11 in addition to Doxil 30 mg/M2 on day 4 and Decadron 40 mg by mouth on weekly basis every 3 weeks.  INTERVAL HISTORY: [name] 51 y.o. male returns to the clinic today for a work in visit for complaints of recurrent vasculitis affecting his abdomen. Patient states that the area as of vasculitis recurred on his abdomen over the previous site of vasculitis approximately one to 2 weeks after discontinuing the Revlimid. He has been utilizing clobetasol cream 0.05% on these areas as this was helpful to him in the past. He does note some soft tissue swelling in the region of the vascular lytic lesions. He presents for further evaluation of these areas. He is currently receiving his Velcade subcutaneously he does have some erythema in the areas where he is received the subcutaneous injections of Velcade. He denied having any significant  fever or chills. No significant weight loss or night sweats. No nausea or vomiting.   MEDICAL HISTORY: Past Medical History  Diagnosis Date  . Multiple myeloma 09/24/2011    ALLERGIES:  is allergic to red dye.  MEDICATIONS:  Current Outpatient Prescriptions  Medication Sig Dispense Refill  . albuterol (PROVENTIL) 2 MG tablet       . chlorpheniramine-HYDROcodone (TUSSIONEX) 10-8 MG/5ML LQCR       . clobetasol cream (TEMOVATE) 0.05 % Apply topically 2 (two) times daily. Rub on affected skin areas as directed  30 g  0  . dexamethasone (DECADRON) 4 MG tablet Take 1 tablet (4 mg total) by mouth as directed. 10 tabs every week with chemo.  40 tablet  4  . dexamethasone (DECADRON) 4 MG tablet TAKE 10 TABLETS BY MOUTH EVERY WEEK WITH CHEMO  40 tablet  4  . HYDROcodone-acetaminophen (VICODIN) 5-500 MG per tablet Take 1 tablet by mouth every 6 (six) hours as needed.        [name] lenalidomide (REVLIMID) 25 MG capsule Take 1 capsule (25 mg total) by mouth daily. 1 tab po for 21 days every 28 days Auth # - [id number] on 2/13.  21 capsule  0  . lisinopril-hydrochlorothiazide (PRINZIDE,ZESTORETIC) 20-12.5 MG per tablet Take 1 tablet by mouth daily.        [name] omeprazole (PRILOSEC) 40 MG capsule Take 1 capsule (40 mg total) by mouth daily.  30 capsule  3  . warfarin (COUMADIN) 2 MG tablet TAKE 1 TABLET BY  MOUTH DAILY  30 tablet  3    REVIEW OF SYSTEMS:  A comprehensive review of systems was negative except for: Constitutional: positive for fatigue Integument/breast: positive for skin lesion(s)   PHYSICAL EXAMINATION: General appearance: alert, cooperative and no distress Head: Normocephalic, without obvious abnormality, atraumatic Neck: no adenopathy Lymph nodes: Cervical, supraclavicular, and axillary nodes normal. Resp: clear to auscultation bilaterally Cardio: regular rate and rhythm, S1, S2 normal, no murmur, click, rub or gallop GI: Abdomen generally soft however there is asymmetry to the right side of  the abdomen with mild to moderate soft tissue swelling underlying the area of vasculitis. The vasculitis is overlying the previous area of vasculitis that the patient had. There is no evidence for super infection. The areas are tender to palpation. Extremities: extremities normal, atraumatic, no cyanosis or edema Neurologic: Alert and oriented X 3, normal strength and tone. Normal symmetric reflexes. Normal coordination and gait Skin: Examination abdomen reveals vasculitic inflammation over the previous area of vasculitis on the right side of the abdomen. There is some eschar in place no active bleeding or bruising. There is some increase in girth/soft tissue swelling on the low right side of the abdomen with some tenderness in the area however no distinct rebound or referred tenderness. There is an area of erythema on the left lateral abdomen no distinct areas of vasculitis on the left side of the abdomen.  ECOG PERFORMANCE STATUS: 1 - Symptomatic but completely ambulatory  Blood pressure 120/78, pulse 98, temperature 97.1 F (36.2 C), temperature source Oral, height 5\' 4"  (1.626 m), weight 249 lb 4.8 oz (113.082 kg).  LABORATORY DATA: Lab Results  Component Value Date   WBC 4.4 03/30/2012   HGB 11.9* 03/30/2012   HCT 34.8* 03/30/2012   MCV 97.1 03/30/2012   PLT 147 03/30/2012      Chemistry      Component Value Date/Time   NA 134* 03/30/2012 1437   K 3.6 03/30/2012 1437   CL 101 03/30/2012 1437   CO2 25 03/30/2012 1437   BUN 11 03/30/2012 1437   CREATININE 0.85 03/30/2012 1437      Component Value Date/Time   CALCIUM 9.0 03/30/2012 1437   ALKPHOS 22* 03/30/2012 1437   AST 20 03/30/2012 1437   ALT 21 03/30/2012 1437   BILITOT 0.4 03/30/2012 1437     Other results: Beta-2 microglobulin 2.71, free kappa light chain 73.20, free lambda light chain 0.12, kappa/lambda ratio 610.00, IgG 3740, IgA 52 and IgM 19.  RADIOGRAPHIC STUDIES: No results found.  ASSESSMENT/PLAN: This is a very pleasant 51  years old [place] American male with recurrent multiple myeloma most recently treated with Revlimid and Decadron status post 27 cycles. Unfortunately the patient has evidence for disease progression based on the recent myeloma panel. The patient is now receiving systemic chemotherapy in the form of subcutaneous Velcade given on days 148 and 11 as well as Doxil given IV on day 4 and Decadron taken by mouth on a weekly basis. The patient was examined by and discussed with Dr. Arbutus Ped. He will be placed on a full dose anticoagulation. We'll start him on Arixtra 10 mg subcutaneously for the next 10 days. Until he completes his current supply of 2 mg Coumadin tablets he is to take a total of 6 mg of Coumadin daily. He will followup with the [facility] on 04/09/2012 with a PT/INR. When he completes his current supply of 2 mg tablets he will begin taking 5 mg  daily or as directed by the Coumadin clinic. He will be on Coumadin therapy for her lifetime for his recurrent vasculitis likely related to his multiple myeloma. He is to return as previously scheduled. Options for Arixtra, Coumadin 5 mg tablets and Vicodin 5 500 for pain management were called to his pharmacy of record.  [name], Armon Orvis E, PA-C   All questions were answered. The patient knows to call the clinic with any problems, questions or concerns. We can certainly see the patient much sooner if necessary.  I spent 20 minutes counseling the patient face to face. The total time spent in the appointment was 35 minutes.

## 2012-04-02 ENCOUNTER — Ambulatory Visit (HOSPITAL_BASED_OUTPATIENT_CLINIC_OR_DEPARTMENT_OTHER): Payer: 59

## 2012-04-02 ENCOUNTER — Other Ambulatory Visit: Payer: 59 | Admitting: Lab

## 2012-04-02 DIAGNOSIS — C9000 Multiple myeloma not having achieved remission: Secondary | ICD-10-CM

## 2012-04-02 DIAGNOSIS — Z5112 Encounter for antineoplastic immunotherapy: Secondary | ICD-10-CM

## 2012-04-02 MED ORDER — ONDANSETRON HCL 8 MG PO TABS
8.0000 mg | ORAL_TABLET | Freq: Once | ORAL | Status: AC
  Administered 2012-04-02: 8 mg via ORAL

## 2012-04-02 MED ORDER — BORTEZOMIB CHEMO SQ INJECTION 3.5 MG (2.5MG/ML)
1.3000 mg/m2 | Freq: Once | INTRAMUSCULAR | Status: AC
  Administered 2012-04-02: 3 mg via SUBCUTANEOUS
  Filled 2012-04-02: qty 3

## 2012-04-06 ENCOUNTER — Ambulatory Visit: Payer: 59 | Admitting: Physician Assistant

## 2012-04-06 ENCOUNTER — Other Ambulatory Visit: Payer: 59 | Admitting: Lab

## 2012-04-06 ENCOUNTER — Telehealth: Payer: Self-pay | Admitting: Medical Oncology

## 2012-04-06 NOTE — Telephone Encounter (Signed)
Per Adrena I told pt to continue using Clindamycin cream and cover areas. His primary MD prescribed doxycline 100 mg bid x 7 days

## 2012-04-06 NOTE — Telephone Encounter (Signed)
Left message that skin lesions opened up last night -when he woke up this am there was blood on his tee shirt. I called pt back and - he thinks the skin stuck to his shirt and got  pulled off the lesions last night. He is self administering arixtra. I will consult with Dr Donnald Garre.

## 2012-04-09 ENCOUNTER — Ambulatory Visit (HOSPITAL_BASED_OUTPATIENT_CLINIC_OR_DEPARTMENT_OTHER): Payer: 59 | Admitting: Physician Assistant

## 2012-04-09 ENCOUNTER — Telehealth: Payer: Self-pay | Admitting: Internal Medicine

## 2012-04-09 ENCOUNTER — Other Ambulatory Visit (HOSPITAL_BASED_OUTPATIENT_CLINIC_OR_DEPARTMENT_OTHER): Payer: 59 | Admitting: Lab

## 2012-04-09 ENCOUNTER — Encounter: Payer: Self-pay | Admitting: Physician Assistant

## 2012-04-09 ENCOUNTER — Ambulatory Visit (HOSPITAL_BASED_OUTPATIENT_CLINIC_OR_DEPARTMENT_OTHER): Payer: 59 | Admitting: Pharmacist

## 2012-04-09 DIAGNOSIS — I776 Arteritis, unspecified: Secondary | ICD-10-CM

## 2012-04-09 DIAGNOSIS — C9000 Multiple myeloma not having achieved remission: Secondary | ICD-10-CM

## 2012-04-09 DIAGNOSIS — L989 Disorder of the skin and subcutaneous tissue, unspecified: Secondary | ICD-10-CM

## 2012-04-09 DIAGNOSIS — R5381 Other malaise: Secondary | ICD-10-CM

## 2012-04-09 DIAGNOSIS — R5383 Other fatigue: Secondary | ICD-10-CM

## 2012-04-09 LAB — PROTIME-INR
INR: 1.3 — ABNORMAL LOW (ref 2.00–3.50)
Protime: 15.6 Seconds — ABNORMAL HIGH (ref 10.6–13.4)

## 2012-04-09 LAB — CBC WITH DIFFERENTIAL/PLATELET
Basophils Absolute: 0 10*3/uL (ref 0.0–0.1)
Eosinophils Absolute: 0 10*3/uL (ref 0.0–0.5)
HCT: 33.4 % — ABNORMAL LOW (ref 38.4–49.9)
LYMPH%: 17.9 % (ref 14.0–49.0)
MCV: 96.5 fL (ref 79.3–98.0)
MONO#: 0.7 10*3/uL (ref 0.1–0.9)
MONO%: 20.5 % — ABNORMAL HIGH (ref 0.0–14.0)
NEUT#: 2.1 10*3/uL (ref 1.5–6.5)
NEUT%: 60.8 % (ref 39.0–75.0)
Platelets: 102 10*3/uL — ABNORMAL LOW (ref 140–400)
WBC: 3.5 10*3/uL — ABNORMAL LOW (ref 4.0–10.3)

## 2012-04-09 LAB — COMPREHENSIVE METABOLIC PANEL
ALT: 28 U/L (ref 0–53)
BUN: 15 mg/dL (ref 6–23)
CO2: 24 mEq/L (ref 19–32)
Calcium: 8.8 mg/dL (ref 8.4–10.5)
Chloride: 98 mEq/L (ref 96–112)
Creatinine, Ser: 0.84 mg/dL (ref 0.50–1.35)
Glucose, Bld: 87 mg/dL (ref 70–99)

## 2012-04-09 LAB — POCT INR: INR: 1.3

## 2012-04-09 NOTE — Progress Notes (Addendum)
Increase coumadin to 7.5mg  daily and continue Arixtra 10mg  daily.  Arixtra 10mg  #4 syringes given to pt.  Will check PT/INR on 04/14/12 with next chemo. Coumadin education complete.  Pt had good understanding.  Wife also present for appt.

## 2012-04-09 NOTE — Telephone Encounter (Signed)
appts made and printed for pt pt aware that i will call re tx appts

## 2012-04-12 NOTE — Progress Notes (Signed)
[facility] [phone]   Fax:(336) [phone]  OFFICE PROGRESS NOTE  [name], MD, MD [address] [place] [address]  Principle Diagnosis: #1 recurrent multiple myeloma IgG kappa subtype diagnosed in June of 2008 #2 history of vasculitis and thrombosis of skin lesions   Prior Therapy: #1 status post palliative radiotherapy to the left hip under the care of Dr. Roselind Messier  #2 status post 5 cycles of systemic chemotherapy with Revlimid and low dose Decadron. Last dose given June 2009 with good response.  #3 status post autologous peripheral blood stem cell transplant at [facility] on 02/02/2008. #4 the patient had evidence for disease recurrence in December 2010.  #5 Revlimid 25 mg by mouth daily for 21 days every 4 weeks in addition to Decadron 40 mg orally on a weekly basis. The patient is status post 27 cycles, discontinued today secondary to disease progression.   Current therapy:  1. Zometa 4 mg IV given every 3 months  2. systemic chemotherapy with Velcade 1.3 mg/M2 on days 1, 4, 8 and 11 in addition to Doxil 30 mg/M2 on day 4 and Decadron 40 mg by mouth on weekly basis every 3 weeks.  INTERVAL HISTORY: [name] 51 y.o. male returns to the clinic today accompanied by his wife. He had considerable skin sloughing in the area of vasculitis that he notified us about, however he failed to tell us he had fever. He did see his primary care physician who placed him on a course of doxycycline. He voices no complaints today. Thus far he is tolerating his chemotherapy with Vecade, Doxil and Decadron without difficulty.He is now on therapeutic dose anticoagulation due to his recurrent vasculitis related to his multiple myeloma. His anticoagulation therapy is being managed by the [facility].He denied having any significant fever or chills. No significant weight loss or night sweats. No nausea or  vomiting.   MEDICAL HISTORY: Past Medical History  Diagnosis Date  . Multiple myeloma 09/24/2011    ALLERGIES:  is allergic to red dye.  MEDICATIONS:  Current Outpatient Prescriptions  Medication Sig Dispense Refill  . albuterol (PROVENTIL) 2 MG tablet       . chlorpheniramine-HYDROcodone (TUSSIONEX) 10-8 MG/5ML LQCR       . clobetasol cream (TEMOVATE) 0.05 % Apply topically 2 (two) times daily. Rub on affected skin areas as directed  30 g  0  . dexamethasone (DECADRON) 4 MG tablet Take 1 tablet (4 mg total) by mouth as directed. 10 tabs every week with chemo.  40 tablet  4  . dexamethasone (DECADRON) 4 MG tablet TAKE 10 TABLETS BY MOUTH EVERY WEEK WITH CHEMO  40 tablet  4  . doxycycline (VIBRA-TABS) 100 MG tablet       . fondaparinux (ARIXTRA) 10 MG/0.8ML SOLN Inject 0.8 mLs (10 mg total) into the skin daily.  7 Syringe  0  . HYDROcodone-acetaminophen (VICODIN) 5-500 MG per tablet Take 1 tablet by mouth every 6 (six) hours as needed.        [name] HYDROcodone-acetaminophen (VICODIN) 5-500 MG per tablet Take 1 tablet by mouth every 6 (six) hours as needed for pain.  40 tablet  0  . lenalidomide (REVLIMID) 25 MG capsule Take 1 capsule (25 mg total) by mouth daily. 1 tab po for 21 days every 28 days Auth # - [id number] on 2/13.  21 capsule  0  . lisinopril-hydrochlorothiazide (PRINZIDE,ZESTORETIC) 20-12.5 MG per  tablet Take 1 tablet by mouth daily.        [name] omeprazole (PRILOSEC) 40 MG capsule Take 1 capsule (40 mg total) by mouth daily.  30 capsule  3  . ondansetron (ZOFRAN) 8 MG tablet       . warfarin (COUMADIN) 5 MG tablet 1 tablet by mouth daily between 4 - 6 pm or as directed  50 tablet  1    REVIEW OF SYSTEMS:  A comprehensive review of systems was negative except for: Constitutional: positive for fatigue Integument/breast: positive for skin lesion(s)   PHYSICAL EXAMINATION: General appearance: alert, cooperative and no distress Head: Normocephalic, without obvious abnormality,  atraumatic Neck: no adenopathy Lymph nodes: Cervical, supraclavicular, and axillary nodes normal. Resp: clear to auscultation bilaterally Cardio: regular rate and rhythm, S1, S2 normal, no murmur, click, rub or gallop GI: Abdomen generally soft however there is asymmetry to the right side of the abdomen with mild to moderate soft tissue swelling underlying the area of vasculitis. The vasculitis is overlying the previous area of vasculitis that the patient had. There is no evidence for super infection. The areas are tender to palpation. Extremities: extremities normal, atraumatic, no cyanosis or edema Neurologic: Alert and oriented X 3, normal strength and tone. Normal symmetric reflexes. Normal coordination and gait Skin: Examination abdomen reveals vasculitic inflammation over the previous area of vasculitis on the right side of the abdomen. There is some eschar in place no active bleeding or bruising. No purulent drainage or evidence of superinfection. There is some decrease  In the previously increased girth/soft tissue swelling on the low right side of the abdomen with some tenderness in the area however no distinct rebound or referred tenderness.  ECOG PERFORMANCE STATUS: 1 - Symptomatic but completely ambulatory  Blood pressure 118/74, pulse 90, temperature 99.8 F (37.7 C), temperature source Oral, height 5\' 4"  (1.626 m), weight 250 lb 4.8 oz (113.535 kg).  LABORATORY DATA: Lab Results  Component Value Date   WBC 3.5* 04/09/2012   HGB 11.4* 04/09/2012   HCT 33.4* 04/09/2012   MCV 96.5 04/09/2012   PLT 102* 04/09/2012      Chemistry      Component Value Date/Time   NA 132* 04/09/2012 1456   K 3.7 04/09/2012 1456   CL 98 04/09/2012 1456   CO2 24 04/09/2012 1456   BUN 15 04/09/2012 1456   CREATININE 0.84 04/09/2012 1456      Component Value Date/Time   CALCIUM 8.8 04/09/2012 1456   ALKPHOS 28* 04/09/2012 1456   AST 30 04/09/2012 1456   ALT 28 04/09/2012 1456   BILITOT 0.3 04/09/2012 1456       Other results: Beta-2 microglobulin 2.71, free kappa light chain 73.20, free lambda light chain 0.12, kappa/lambda ratio 610.00, IgG 3740, IgA 52 and IgM 19.  RADIOGRAPHIC STUDIES: No results found.  ASSESSMENT/PLAN: This is a very pleasant 51 years old [place] American male with recurrent multiple myeloma most recently treated with Revlimid and Decadron status post 27 cycles. Unfortunately the patient has evidence for disease progression based on the recent myeloma panel. The patient is now receiving systemic chemotherapy in the form of subcutaneous Velcade given on days 1,4,8 and 11 as well as Doxil given IV on day 4 and Decadron taken by mouth on a weekly basis. The patient was discussed with Dr. Arbutus Ped. He will continue on full dose anticoagulation as [facility]. He will return in 2 weeks with a repeat CBC Differential, CMET  and LDH.  Laural Benes, Maryiah Olvey E, PA-C   All questions were answered. The patient knows to call the clinic with any problems, questions or concerns. We can certainly see the patient much sooner if necessary.  I spent 20 minutes counseling the patient face to face. The total time spent in the appointment was 30 minutes.

## 2012-04-14 ENCOUNTER — Ambulatory Visit (HOSPITAL_BASED_OUTPATIENT_CLINIC_OR_DEPARTMENT_OTHER): Payer: 59

## 2012-04-14 ENCOUNTER — Ambulatory Visit: Payer: 59

## 2012-04-14 ENCOUNTER — Ambulatory Visit: Payer: Self-pay | Admitting: Pharmacist

## 2012-04-14 ENCOUNTER — Other Ambulatory Visit: Payer: Self-pay | Admitting: Internal Medicine

## 2012-04-14 ENCOUNTER — Other Ambulatory Visit (HOSPITAL_BASED_OUTPATIENT_CLINIC_OR_DEPARTMENT_OTHER): Payer: 59 | Admitting: Lab

## 2012-04-14 DIAGNOSIS — I776 Arteritis, unspecified: Secondary | ICD-10-CM

## 2012-04-14 DIAGNOSIS — C9000 Multiple myeloma not having achieved remission: Secondary | ICD-10-CM

## 2012-04-14 DIAGNOSIS — Z5112 Encounter for antineoplastic immunotherapy: Secondary | ICD-10-CM

## 2012-04-14 DIAGNOSIS — Z7901 Long term (current) use of anticoagulants: Secondary | ICD-10-CM

## 2012-04-14 LAB — CBC WITH DIFFERENTIAL/PLATELET
BASO%: 0.3 % (ref 0.0–2.0)
Basophils Absolute: 0 10*3/uL (ref 0.0–0.1)
HCT: 31.8 % — ABNORMAL LOW (ref 38.4–49.9)
HGB: 10.9 g/dL — ABNORMAL LOW (ref 13.0–17.1)
MONO#: 0.6 10*3/uL (ref 0.1–0.9)
NEUT%: 65.2 % (ref 39.0–75.0)
RDW: 16 % — ABNORMAL HIGH (ref 11.0–14.6)
WBC: 4.2 10*3/uL (ref 4.0–10.3)
lymph#: 0.8 10*3/uL — ABNORMAL LOW (ref 0.9–3.3)

## 2012-04-14 LAB — COMPREHENSIVE METABOLIC PANEL
AST: 19 U/L (ref 0–37)
Albumin: 3.4 g/dL — ABNORMAL LOW (ref 3.5–5.2)
BUN: 8 mg/dL (ref 6–23)
Calcium: 9.1 mg/dL (ref 8.4–10.5)
Chloride: 105 mEq/L (ref 96–112)
Glucose, Bld: 77 mg/dL (ref 70–99)
Potassium: 3.8 mEq/L (ref 3.5–5.3)
Sodium: 137 mEq/L (ref 135–145)
Total Protein: 8.9 g/dL — ABNORMAL HIGH (ref 6.0–8.3)

## 2012-04-14 LAB — PROTIME-INR

## 2012-04-14 MED ORDER — BORTEZOMIB CHEMO SQ INJECTION 3.5 MG (2.5MG/ML)
1.3000 mg/m2 | Freq: Once | INTRAMUSCULAR | Status: AC
  Administered 2012-04-14: 3 mg via SUBCUTANEOUS
  Filled 2012-04-14: qty 3

## 2012-04-14 MED ORDER — ONDANSETRON HCL 8 MG PO TABS
8.0000 mg | ORAL_TABLET | Freq: Once | ORAL | Status: AC
  Administered 2012-04-14: 8 mg via ORAL

## 2012-04-14 NOTE — Progress Notes (Signed)
Pt seen in infusion area today.  He had his last Arixtra shot yesterday.  His INR today was 2.3.  Instructed to continue 7.5 mg daily and no more shots (he and his wife confirmed the Arixtra shots were gone).  We will recheck his INR next Monday (6/3) in infusion area.

## 2012-04-14 NOTE — Patient Instructions (Signed)
Pt seen in infusion area today.  He had his last Arixtra shot yesterday.  His INR today was 2.3.  Instructed to continue 7.5 mg daily and no more shots (he said he and his wife confirmed the Arixtra shots were gone).  We will recheck his INR next Monday (6/3) in infusion area.

## 2012-04-17 ENCOUNTER — Ambulatory Visit (HOSPITAL_BASED_OUTPATIENT_CLINIC_OR_DEPARTMENT_OTHER): Payer: 59

## 2012-04-17 ENCOUNTER — Other Ambulatory Visit: Payer: 59 | Admitting: Lab

## 2012-04-17 VITALS — BP 115/75 | HR 79 | Temp 98.6°F

## 2012-04-17 DIAGNOSIS — C9000 Multiple myeloma not having achieved remission: Secondary | ICD-10-CM

## 2012-04-17 DIAGNOSIS — Z5112 Encounter for antineoplastic immunotherapy: Secondary | ICD-10-CM

## 2012-04-17 MED ORDER — DOXORUBICIN HCL LIPOSOMAL CHEMO INJECTION 2 MG/ML
30.0000 mg/m2 | Freq: Once | INTRAVENOUS | Status: AC
  Administered 2012-04-17: 68 mg via INTRAVENOUS
  Filled 2012-04-17: qty 34

## 2012-04-17 MED ORDER — BORTEZOMIB CHEMO SQ INJECTION 3.5 MG (2.5MG/ML)
1.3000 mg/m2 | Freq: Once | INTRAMUSCULAR | Status: AC
  Administered 2012-04-17: 3 mg via SUBCUTANEOUS
  Filled 2012-04-17: qty 3

## 2012-04-17 MED ORDER — SODIUM CHLORIDE 0.9 % IV SOLN
Freq: Once | INTRAVENOUS | Status: AC
  Administered 2012-04-17: 14:00:00 via INTRAVENOUS

## 2012-04-17 MED ORDER — ONDANSETRON 16 MG/50ML IVPB (CHCC)
16.0000 mg | Freq: Once | INTRAVENOUS | Status: AC
  Administered 2012-04-17: 16 mg via INTRAVENOUS

## 2012-04-17 NOTE — Patient Instructions (Addendum)
[facility] Discharge Instructions for Patients Receiving Chemotherapy  Today you received the following chemotherapy agents Velcade and Doxil  To help prevent nausea and vomiting after your treatment, we encourage you to take your nausea medication as prescribed.   If you develop nausea and vomiting that is not controlled by your nausea medication, call the clinic. If it is after clinic hours your family physician or the after hours number for the clinic or go to the Emergency Department.   BELOW ARE SYMPTOMS THAT SHOULD BE REPORTED IMMEDIATELY:  *FEVER GREATER THAN 100.5 F  *CHILLS WITH OR WITHOUT FEVER  NAUSEA AND VOMITING THAT IS NOT CONTROLLED WITH YOUR NAUSEA MEDICATION  *UNUSUAL SHORTNESS OF BREATH  *UNUSUAL BRUISING OR BLEEDING  TENDERNESS IN MOUTH AND THROAT WITH OR WITHOUT PRESENCE OF ULCERS  *URINARY PROBLEMS  *BOWEL PROBLEMS  UNUSUAL RASH Items with * indicate a potential emergency and should be followed up as soon as possible.  One of the nurses will contact you 24 hours after your treatment. Please let the nurse know about any problems that you may have experienced. Feel free to call the clinic you have any questions or concerns. The clinic phone number is [phone].   I have been informed and understand all the instructions given to me. I know to contact the clinic, my physician, or go to the Emergency Department if any problems should occur. I do not have any questions at this time, but understand that I may call the clinic during office hours   should I have any questions or need assistance in obtaining follow up care.    __________________________________________  _____________  __________ Signature of Patient or Authorized Representative            Date                   Time    __________________________________________ Nurse's Signature

## 2012-04-20 ENCOUNTER — Ambulatory Visit (HOSPITAL_BASED_OUTPATIENT_CLINIC_OR_DEPARTMENT_OTHER): Payer: 59

## 2012-04-20 ENCOUNTER — Ambulatory Visit: Payer: 59 | Admitting: Pharmacist

## 2012-04-20 ENCOUNTER — Ambulatory Visit (HOSPITAL_BASED_OUTPATIENT_CLINIC_OR_DEPARTMENT_OTHER): Payer: 59 | Admitting: Physician Assistant

## 2012-04-20 ENCOUNTER — Telehealth: Payer: Self-pay | Admitting: Internal Medicine

## 2012-04-20 ENCOUNTER — Encounter: Payer: Self-pay | Admitting: Physician Assistant

## 2012-04-20 ENCOUNTER — Other Ambulatory Visit (HOSPITAL_BASED_OUTPATIENT_CLINIC_OR_DEPARTMENT_OTHER): Payer: 59

## 2012-04-20 VITALS — BP 124/75 | HR 70 | Temp 98.1°F | Ht 64.0 in | Wt 256.9 lb

## 2012-04-20 DIAGNOSIS — C9000 Multiple myeloma not having achieved remission: Secondary | ICD-10-CM

## 2012-04-20 DIAGNOSIS — Z7901 Long term (current) use of anticoagulants: Secondary | ICD-10-CM

## 2012-04-20 DIAGNOSIS — I776 Arteritis, unspecified: Secondary | ICD-10-CM

## 2012-04-20 DIAGNOSIS — Z5112 Encounter for antineoplastic immunotherapy: Secondary | ICD-10-CM

## 2012-04-20 DIAGNOSIS — L989 Disorder of the skin and subcutaneous tissue, unspecified: Secondary | ICD-10-CM

## 2012-04-20 LAB — POCT INR: INR: 3.7

## 2012-04-20 LAB — COMPREHENSIVE METABOLIC PANEL
ALT: 25 U/L (ref 0–53)
AST: 23 U/L (ref 0–37)
Albumin: 3 g/dL — ABNORMAL LOW (ref 3.5–5.2)
BUN: 15 mg/dL (ref 6–23)
Calcium: 8.5 mg/dL (ref 8.4–10.5)
Chloride: 104 mEq/L (ref 96–112)
Potassium: 3 mEq/L — ABNORMAL LOW (ref 3.5–5.3)
Sodium: 137 mEq/L (ref 135–145)
Total Protein: 8.2 g/dL (ref 6.0–8.3)

## 2012-04-20 LAB — CBC WITH DIFFERENTIAL/PLATELET
BASO%: 0 % (ref 0.0–2.0)
HCT: 30.1 % — ABNORMAL LOW (ref 38.4–49.9)
MCHC: 34.6 g/dL (ref 32.0–36.0)
MONO#: 0.7 10*3/uL (ref 0.1–0.9)
NEUT%: 52.9 % (ref 39.0–75.0)
WBC: 3.6 10*3/uL — ABNORMAL LOW (ref 4.0–10.3)
lymph#: 1 10*3/uL (ref 0.9–3.3)
nRBC: 1 % — ABNORMAL HIGH (ref 0–0)

## 2012-04-20 MED ORDER — BORTEZOMIB CHEMO SQ INJECTION 3.5 MG (2.5MG/ML)
1.3000 mg/m2 | Freq: Once | INTRAMUSCULAR | Status: AC
  Administered 2012-04-20: 3 mg via SUBCUTANEOUS
  Filled 2012-04-20: qty 3

## 2012-04-20 MED ORDER — ONDANSETRON HCL 8 MG PO TABS
8.0000 mg | ORAL_TABLET | Freq: Once | ORAL | Status: AC
  Administered 2012-04-20: 8 mg via ORAL

## 2012-04-20 NOTE — Progress Notes (Signed)
INR = 3.7 on 7.5 mg/day No bleeding. Pt took extra dose of Decadron last week (therapeutic dose) by mistake so this is likely the cause of his INR being slightly above goal. Hold x 1 dose of Coumadin today only then back to 7.5 mg/day. Return for protime check in 2 weeks at pt request. Marily Lente, Pharm.D.

## 2012-04-20 NOTE — Telephone Encounter (Signed)
Gv pt appt for [id number].  Informed pt that his chemo will be scheduled after his md appt time on 06/24 but to arrive @ 1:45pm on that date. asked pt to check with Elon Jester to see if she has adjusted chemo time for 06/24 before he leaves today

## 2012-04-20 NOTE — Progress Notes (Signed)
Quick Note:  Call patient with the result and RX K Dur 20 meq po X 7 days ______

## 2012-04-21 ENCOUNTER — Telehealth: Payer: Self-pay | Admitting: Medical Oncology

## 2012-04-21 ENCOUNTER — Other Ambulatory Visit: Payer: Self-pay | Admitting: Pharmacist

## 2012-04-21 DIAGNOSIS — I776 Arteritis, unspecified: Secondary | ICD-10-CM

## 2012-04-21 DIAGNOSIS — E876 Hypokalemia: Secondary | ICD-10-CM

## 2012-04-21 MED ORDER — POTASSIUM CHLORIDE CRYS ER 20 MEQ PO TBCR: 20.0000 meq | EXTENDED_RELEASE_TABLET | Freq: Every day | ORAL | Status: DC

## 2012-04-21 NOTE — Telephone Encounter (Signed)
Called to pt and faxed kdur to pharmacy

## 2012-04-21 NOTE — Telephone Encounter (Signed)
Message copied by Charma Igo on Tue Apr 21, 2012 11:39 AM ------      Message from: Si Gaul      Created: Mon Apr 20, 2012  8:37 PM       Call patient with the result and RX K Dur 20 meq po X 7 days

## 2012-04-23 ENCOUNTER — Ambulatory Visit (HOSPITAL_BASED_OUTPATIENT_CLINIC_OR_DEPARTMENT_OTHER): Payer: 59

## 2012-04-23 ENCOUNTER — Other Ambulatory Visit: Payer: 59

## 2012-04-23 VITALS — BP 144/77 | HR 90 | Temp 98.2°F

## 2012-04-23 DIAGNOSIS — Z5112 Encounter for antineoplastic immunotherapy: Secondary | ICD-10-CM

## 2012-04-23 DIAGNOSIS — C9000 Multiple myeloma not having achieved remission: Secondary | ICD-10-CM

## 2012-04-23 MED ORDER — BORTEZOMIB CHEMO SQ INJECTION 3.5 MG (2.5MG/ML)
1.3000 mg/m2 | Freq: Once | INTRAMUSCULAR | Status: AC
  Administered 2012-04-23: 3 mg via SUBCUTANEOUS
  Filled 2012-04-23: qty 3

## 2012-04-23 MED ORDER — ONDANSETRON HCL 8 MG PO TABS
8.0000 mg | ORAL_TABLET | Freq: Once | ORAL | Status: AC
  Administered 2012-04-23: 8 mg via ORAL

## 2012-04-23 NOTE — Progress Notes (Signed)
[facility] [phone]   [phone]  OFFICE PROGRESS NOTE  [name], MD, MD [address] [place] [address]  Principle Diagnosis: #1 recurrent multiple myeloma IgG kappa subtype diagnosed in June of 2008 #2 history of vasculitis and thrombosis of skin lesions   Prior Therapy: #1 status post palliative radiotherapy to the left hip under the care of Dr. Roselind Messier  #2 status post 5 cycles of systemic chemotherapy with Revlimid and low dose Decadron. Last dose given June 2009 with good response.  #3 status post autologous peripheral blood stem cell transplant at [facility] on 02/02/2008. #4 the patient had evidence for disease recurrence in December 2010.  #5 Revlimid 25 mg by mouth daily for 21 days every 4 weeks in addition to Decadron 40 mg orally on a weekly basis. The patient is status post 27 cycles, discontinued today secondary to disease progression.   Current therapy:  1. Zometa 4 mg IV given every 3 months  2. systemic chemotherapy with Velcade 1.3 mg/M2 on days 1, 4, 8 and 11 in addition to Doxil 30 mg/M2 on day 4 and Decadron 40 mg by mouth on weekly basis every 3 weeks. Status post 1cycle and days 1&4 of cycle 2  INTERVAL HISTORY: RAMERE DOWNS 51 y.o. male returns to the clinic today accompanied by his wife. He completed his course of antibiotics and states the areas on his abdomen where he had the painful skin sloughing are healing well. He voices no specific complaints today.  Thus far he is tolerating his chemotherapy with Vecade, Doxil and Decadron without difficulty.He is now on therapeutic dose anticoagulation due to his recurrent vasculitis related to his multiple myeloma. His anticoagulation therapy is being managed by the [facility].He denied having any significant fever or chills. No significant weight loss or night sweats. No nausea or vomiting.   MEDICAL  HISTORY: Past Medical History  Diagnosis Date  . Multiple myeloma 09/24/2011    ALLERGIES:  is allergic to red dye.  MEDICATIONS:  Current Outpatient Prescriptions  Medication Sig Dispense Refill  . albuterol (PROVENTIL) 2 MG tablet       . chlorpheniramine-HYDROcodone (TUSSIONEX) 10-8 MG/5ML LQCR       . clobetasol cream (TEMOVATE) 0.05 % Apply topically 2 (two) times daily. Rub on affected skin areas as directed  30 g  0  . dexamethasone (DECADRON) 4 MG tablet Take 1 tablet (4 mg total) by mouth as directed. 10 tabs every week with chemo.  40 tablet  4  . dexamethasone (DECADRON) 4 MG tablet TAKE 10 TABLETS BY MOUTH EVERY WEEK WITH CHEMO  40 tablet  4  . doxycycline (VIBRA-TABS) 100 MG tablet       . fondaparinux (ARIXTRA) 10 MG/0.8ML SOLN Inject 0.8 mLs (10 mg total) into the skin daily.  7 Syringe  0  . HYDROcodone-acetaminophen (VICODIN) 5-500 MG per tablet Take 1 tablet by mouth every 6 (six) hours as needed.        [name] lenalidomide (REVLIMID) 25 MG capsule Take 1 capsule (25 mg total) by mouth daily. 1 tab po for 21 days every 28 days Auth # - [id number] on 2/13.  21 capsule  0  . lisinopril-hydrochlorothiazide (PRINZIDE,ZESTORETIC) 20-12.5 MG per tablet Take 1 tablet by mouth daily.        [name] omeprazole (PRILOSEC) 40 MG capsule Take 1 capsule (40 mg total) by mouth daily.  30  capsule  3  . ondansetron (ZOFRAN) 8 MG tablet       . potassium chloride SA (K-DUR,KLOR-CON) 20 MEQ tablet Take 1 tablet (20 mEq total) by mouth daily.  7 tablet  0  . warfarin (COUMADIN) 5 MG tablet 1 tablet by mouth daily between 4 - 6 pm or as directed  50 tablet  1   No current facility-administered medications for this visit.   Facility-Administered Medications Ordered in Other Visits  Medication Dose Route Frequency Provider Last Rate Last Dose  . bortezomib SQ (VELCADE) chemo injection 3 mg  1.3 mg/m2 (Treatment Plan Actual) Subcutaneous Once Si Gaul, MD   3 mg at 04/23/12 1204  . ondansetron  (ZOFRAN) tablet 8 mg  8 mg Oral Once Si Gaul, MD   8 mg at 04/23/12 1146    REVIEW OF SYSTEMS:  A comprehensive review of systems was negative except for: Constitutional: positive for fatigue Integument/breast: positive for skin lesion(s)   PHYSICAL EXAMINATION: General appearance: alert, cooperative and no distress Head: Normocephalic, without obvious abnormality, atraumatic Neck: no adenopathy Lymph nodes: Cervical, supraclavicular, and axillary nodes normal. Resp: clear to auscultation bilaterally Cardio: regular rate and rhythm, S1, S2 normal, no murmur, click, rub or gallop GI: Abdomen generally soft however there is asymmetry to the right side of the abdomen with mild to moderate soft tissue swelling underlying the area of vasculitis. The vasculitis is overlying the previous area of vasculitis that the patient had. There is no evidence for super infection. The areas are less tender to palpation. Extremities: extremities normal, atraumatic, no cyanosis or edema Neurologic: Alert and oriented X 3, normal strength and tone. Normal symmetric reflexes. Normal coordination and gait Skin: Examination abdomen reveals vasculitic inflammation over the previous area of vasculitis on the right side of the abdomen. There is more eschar in place no active bleeding or bruising. No purulent drainage or evidence of superinfection. There is more decrease  In the previously increased girth/soft tissue swelling on the low right side of the abdomen with some tenderness in the area however no distinct rebound or referred tenderness.  ECOG PERFORMANCE STATUS: 1 - Symptomatic but completely ambulatory  Blood pressure 124/75, pulse 70, temperature 98.1 F (36.7 C), temperature source Oral, height 5\' 4"  (1.626 m), weight 256 lb 14.4 oz (116.529 kg).  LABORATORY DATA: Lab Results  Component Value Date   WBC 3.6* 04/20/2012   HGB 10.4* 04/20/2012   HCT 30.1* 04/20/2012   MCV 91.2 04/20/2012   PLT 109*  04/20/2012      Chemistry      Component Value Date/Time   NA 137 04/20/2012 1320   K 3.0* 04/20/2012 1320   CL 104 04/20/2012 1320   CO2 26 04/20/2012 1320   BUN 15 04/20/2012 1320   CREATININE 0.62 04/20/2012 1320      Component Value Date/Time   CALCIUM 8.5 04/20/2012 1320   ALKPHOS 22* 04/20/2012 1320   AST 23 04/20/2012 1320   ALT 25 04/20/2012 1320   BILITOT 0.5 04/20/2012 1320     Other results: Beta-2 microglobulin 2.71, free kappa light chain 73.20, free lambda light chain 0.12, kappa/lambda ratio 610.00, IgG 3740, IgA 52 and IgM 19.  RADIOGRAPHIC STUDIES: No results found.  ASSESSMENT/PLAN: This is a very pleasant 51 years old [place] American male with recurrent multiple myeloma most recently treated with Revlimid and Decadron status post 27 cycles. Unfortunately the patient has evidence for disease progression based on the last myeloma panel. The patient is  now receiving systemic chemotherapy in the form of subcutaneous Velcade given on days 1,4,8 and 11 as well as Doxil given IV on day 4 and Decadron taken by mouth on a weekly basis. The patient was discussed with Dr. Arbutus Ped. He will continue on full dose anticoagulation [facility]. He will complete 3 cycles of Velcade, Doxil, and Decadron. The instructions for taking the Decadron were clarified and the patient voiced understanding.He will return in 3 weeks with a repeat CBC Differential, CMET and LDH as well as a repeat myeloma panel to reevaluate his disease.  Laural Benes, Tahirih Lair E, PA-C   All questions were answered. The patient knows to call the clinic with any problems, questions or concerns. We can certainly see the patient much sooner if necessary.  I spent 20 minutes counseling the patient face to face. The total time spent in the appointment was 30 minutes.

## 2012-04-25 ENCOUNTER — Other Ambulatory Visit: Payer: Self-pay | Admitting: Oncology

## 2012-05-04 ENCOUNTER — Ambulatory Visit: Payer: 59 | Admitting: Pharmacist

## 2012-05-04 ENCOUNTER — Other Ambulatory Visit: Payer: Self-pay | Admitting: Internal Medicine

## 2012-05-04 ENCOUNTER — Other Ambulatory Visit (HOSPITAL_BASED_OUTPATIENT_CLINIC_OR_DEPARTMENT_OTHER): Payer: 59 | Admitting: Lab

## 2012-05-04 ENCOUNTER — Ambulatory Visit (HOSPITAL_BASED_OUTPATIENT_CLINIC_OR_DEPARTMENT_OTHER): Payer: 59

## 2012-05-04 ENCOUNTER — Encounter: Payer: Self-pay | Admitting: *Deleted

## 2012-05-04 VITALS — BP 105/63 | HR 100 | Temp 98.4°F

## 2012-05-04 DIAGNOSIS — C9000 Multiple myeloma not having achieved remission: Secondary | ICD-10-CM

## 2012-05-04 DIAGNOSIS — I776 Arteritis, unspecified: Secondary | ICD-10-CM

## 2012-05-04 DIAGNOSIS — Z5112 Encounter for antineoplastic immunotherapy: Secondary | ICD-10-CM

## 2012-05-04 LAB — COMPREHENSIVE METABOLIC PANEL
CO2: 27 mEq/L (ref 19–32)
Creatinine, Ser: 0.56 mg/dL (ref 0.50–1.35)
Glucose, Bld: 82 mg/dL (ref 70–99)
Sodium: 136 mEq/L (ref 135–145)
Total Bilirubin: 0.5 mg/dL (ref 0.3–1.2)
Total Protein: 8.7 g/dL — ABNORMAL HIGH (ref 6.0–8.3)

## 2012-05-04 LAB — PROTIME-INR
INR: 2.2 (ref 2.00–3.50)
Protime: 26.4 Seconds — ABNORMAL HIGH (ref 10.6–13.4)

## 2012-05-04 LAB — CBC WITH DIFFERENTIAL/PLATELET
BASO%: 0.3 % (ref 0.0–2.0)
Basophils Absolute: 0 10*3/uL (ref 0.0–0.1)
EOS%: 0.3 % (ref 0.0–7.0)
HGB: 10.8 g/dL — ABNORMAL LOW (ref 13.0–17.1)
MCH: 32.1 pg (ref 27.2–33.4)
MCV: 93.7 fL (ref 79.3–98.0)
MONO%: 23.1 % — ABNORMAL HIGH (ref 0.0–14.0)
RBC: 3.37 10*6/uL — ABNORMAL LOW (ref 4.20–5.82)
RDW: 16.2 % — ABNORMAL HIGH (ref 11.0–14.6)
lymph#: 0.7 10*3/uL — ABNORMAL LOW (ref 0.9–3.3)

## 2012-05-04 MED ORDER — ONDANSETRON HCL 8 MG PO TABS
8.0000 mg | ORAL_TABLET | Freq: Once | ORAL | Status: AC
  Administered 2012-05-04: 8 mg via ORAL

## 2012-05-04 MED ORDER — BORTEZOMIB CHEMO SQ INJECTION 3.5 MG (2.5MG/ML)
1.0000 mg/m2 | Freq: Once | INTRAMUSCULAR | Status: AC
  Administered 2012-05-04: 2.25 mg via SUBCUTANEOUS
  Filled 2012-05-04: qty 2.25

## 2012-05-04 NOTE — Patient Instructions (Signed)
Continue same dose of 7.5mg  (1&1/2 tablets) daily.  Recheck PT/INR on 05/14/12. The pharmacist will see you in the infusion area.

## 2012-05-04 NOTE — Progress Notes (Signed)
Continue same dose of 7.5mg  daily (1&1/2 tablets daily) .  Recheck PT/INR on 05/14/12.

## 2012-05-04 NOTE — Patient Instructions (Signed)
[facility] Discharge Instructions for Patients Receiving Chemotherapy  Today you received the following chemotherapy agents Velcade.  To help prevent nausea and vomiting after your treatment, we encourage you to take your nausea medication as prescribed.   If you develop nausea and vomiting that is not controlled by your nausea medication, call the clinic. If it is after clinic hours your family physician or the after hours number for the clinic or go to the Emergency Department.   BELOW ARE SYMPTOMS THAT SHOULD BE REPORTED IMMEDIATELY:  *FEVER GREATER THAN 100.5 F  *CHILLS WITH OR WITHOUT FEVER  NAUSEA AND VOMITING THAT IS NOT CONTROLLED WITH YOUR NAUSEA MEDICATION  *UNUSUAL SHORTNESS OF BREATH  *UNUSUAL BRUISING OR BLEEDING  TENDERNESS IN MOUTH AND THROAT WITH OR WITHOUT PRESENCE OF ULCERS  *URINARY PROBLEMS  *BOWEL PROBLEMS  UNUSUAL RASH Items with * indicate a potential emergency and should be followed up as soon as possible.  One of the nurses will contact you 24 hours after your treatment. Please let the nurse know about any problems that you may have experienced. Feel free to call the clinic you have any questions or concerns. The clinic phone number is [phone].   I have been informed and understand all the instructions given to me. I know to contact the clinic, my physician, or go to the Emergency Department if any problems should occur. I do not have any questions at this time, but understand that I may call the clinic during office hours   should I have any questions or need assistance in obtaining follow up care.    __________________________________________  _____________  __________ Signature of Patient or Authorized Representative            Date                   Time    __________________________________________ Nurse's Signature    

## 2012-05-04 NOTE — Progress Notes (Signed)
Pt requesting temporary handicap parking due to his neuropathy, per Dr Donnald Garre, okay to give him temporary handicap x 1 year.  Pt's FMLA papers given to Axel Filler in medical mgmt.  SLJ

## 2012-05-04 NOTE — Progress Notes (Signed)
Pt called stating he has a burning and tingling in his feet and in his thumb and index finger of his hands.  He is still able to button his buttons and can walk but he is having discomfort.  Per Dr Donnald Garre, will dose reduce his velcade.  Informed infusion RN who will inform pt at appt today.  SLJ

## 2012-05-05 ENCOUNTER — Encounter: Payer: Self-pay | Admitting: Internal Medicine

## 2012-05-05 NOTE — Progress Notes (Signed)
Put fmla papers on nurse's desk °

## 2012-05-07 ENCOUNTER — Other Ambulatory Visit: Payer: 59 | Admitting: Lab

## 2012-05-07 ENCOUNTER — Ambulatory Visit (HOSPITAL_BASED_OUTPATIENT_CLINIC_OR_DEPARTMENT_OTHER): Payer: 59

## 2012-05-07 DIAGNOSIS — Z5111 Encounter for antineoplastic chemotherapy: Secondary | ICD-10-CM

## 2012-05-07 DIAGNOSIS — Z5112 Encounter for antineoplastic immunotherapy: Secondary | ICD-10-CM

## 2012-05-07 DIAGNOSIS — C9000 Multiple myeloma not having achieved remission: Secondary | ICD-10-CM

## 2012-05-07 MED ORDER — SODIUM CHLORIDE 0.9 % IJ SOLN
10.0000 mL | INTRAMUSCULAR | Status: DC | PRN
  Filled 2012-05-07: qty 10

## 2012-05-07 MED ORDER — ONDANSETRON 16 MG/50ML IVPB (CHCC)
16.0000 mg | Freq: Once | INTRAVENOUS | Status: AC
  Administered 2012-05-07: 16 mg via INTRAVENOUS

## 2012-05-07 MED ORDER — BORTEZOMIB CHEMO SQ INJECTION 3.5 MG (2.5MG/ML)
1.0000 mg/m2 | Freq: Once | INTRAMUSCULAR | Status: AC
  Administered 2012-05-07: 2.25 mg via SUBCUTANEOUS
  Filled 2012-05-07: qty 2.25

## 2012-05-07 MED ORDER — HEPARIN SOD (PORK) LOCK FLUSH 100 UNIT/ML IV SOLN
500.0000 [IU] | Freq: Once | INTRAVENOUS | Status: DC | PRN
  Filled 2012-05-07: qty 5

## 2012-05-07 MED ORDER — SODIUM CHLORIDE 0.9 % IV SOLN
Freq: Once | INTRAVENOUS | Status: AC
  Administered 2012-05-07: 14:00:00 via INTRAVENOUS

## 2012-05-07 MED ORDER — DOXORUBICIN HCL LIPOSOMAL CHEMO INJECTION 2 MG/ML
30.0000 mg/m2 | Freq: Once | INTRAVENOUS | Status: AC
  Administered 2012-05-07: 68 mg via INTRAVENOUS
  Filled 2012-05-07: qty 34

## 2012-05-08 ENCOUNTER — Encounter: Payer: Self-pay | Admitting: Internal Medicine

## 2012-05-08 ENCOUNTER — Encounter: Payer: Self-pay | Admitting: *Deleted

## 2012-05-08 NOTE — Progress Notes (Signed)
FMLA paperwork signed and given to [facility] in medical mgmt.  SLJ

## 2012-05-08 NOTE — Progress Notes (Signed)
Faxed fmla papers to [employer] @ [employer] [phone].

## 2012-05-09 ENCOUNTER — Other Ambulatory Visit: Payer: Self-pay | Admitting: Internal Medicine

## 2012-05-10 ENCOUNTER — Other Ambulatory Visit: Payer: Self-pay | Admitting: Oncology

## 2012-05-10 MED ORDER — GABAPENTIN 300 MG PO CAPS: 300.0000 mg | ORAL_CAPSULE | Freq: Three times a day (TID) | ORAL | Status: DC

## 2012-05-11 ENCOUNTER — Other Ambulatory Visit: Payer: 59 | Admitting: Lab

## 2012-05-11 ENCOUNTER — Ambulatory Visit (HOSPITAL_BASED_OUTPATIENT_CLINIC_OR_DEPARTMENT_OTHER): Payer: 59 | Admitting: Physician Assistant

## 2012-05-11 ENCOUNTER — Ambulatory Visit: Payer: 59

## 2012-05-11 ENCOUNTER — Other Ambulatory Visit (HOSPITAL_BASED_OUTPATIENT_CLINIC_OR_DEPARTMENT_OTHER): Payer: 59 | Admitting: Lab

## 2012-05-11 ENCOUNTER — Ambulatory Visit: Payer: Self-pay | Admitting: Pharmacist

## 2012-05-11 ENCOUNTER — Telehealth: Payer: Self-pay | Admitting: Medical Oncology

## 2012-05-11 ENCOUNTER — Encounter: Payer: Self-pay | Admitting: Physician Assistant

## 2012-05-11 VITALS — BP 107/68 | HR 99 | Temp 98.4°F | Ht 64.0 in | Wt 238.5 lb

## 2012-05-11 DIAGNOSIS — Z5181 Encounter for therapeutic drug level monitoring: Secondary | ICD-10-CM

## 2012-05-11 DIAGNOSIS — I776 Arteritis, unspecified: Secondary | ICD-10-CM

## 2012-05-11 DIAGNOSIS — L989 Disorder of the skin and subcutaneous tissue, unspecified: Secondary | ICD-10-CM

## 2012-05-11 DIAGNOSIS — C9000 Multiple myeloma not having achieved remission: Secondary | ICD-10-CM

## 2012-05-11 DIAGNOSIS — Z86718 Personal history of other venous thrombosis and embolism: Secondary | ICD-10-CM

## 2012-05-11 DIAGNOSIS — Z7901 Long term (current) use of anticoagulants: Secondary | ICD-10-CM

## 2012-05-11 DIAGNOSIS — C9001 Multiple myeloma in remission: Secondary | ICD-10-CM

## 2012-05-11 LAB — CBC WITH DIFFERENTIAL/PLATELET
Basophils Absolute: 0 10*3/uL (ref 0.0–0.1)
EOS%: 0 % (ref 0.0–7.0)
HCT: 30.8 % — ABNORMAL LOW (ref 38.4–49.9)
HGB: 10.9 g/dL — ABNORMAL LOW (ref 13.0–17.1)
MCH: 31.5 pg (ref 27.2–33.4)
MONO#: 0.5 10*3/uL (ref 0.1–0.9)
NEUT#: 1.4 10*3/uL — ABNORMAL LOW (ref 1.5–6.5)
NEUT%: 64.9 % (ref 39.0–75.0)
RDW: 15.5 % — ABNORMAL HIGH (ref 11.0–14.6)
WBC: 2.2 10*3/uL — ABNORMAL LOW (ref 4.0–10.3)
lymph#: 0.3 10*3/uL — ABNORMAL LOW (ref 0.9–3.3)

## 2012-05-11 LAB — PROTIME-INR
INR: 1.6 — ABNORMAL LOW (ref 2.00–3.50)
Protime: 19.2 Seconds — ABNORMAL HIGH (ref 10.6–13.4)

## 2012-05-11 NOTE — Telephone Encounter (Addendum)
Pt called with 2concerns=can his doxil be given SQ? I told him no.  His neuropathy is worse interfering with his job functions. He called on call provider who increased his neurontin to 300mg  tid. He started it yesterday and thinks his feet today are " a little less tingly".  I told him to keep his appointments today and to also let us know how he is on Thursday  and if worse in the next 2 days to call back. He voices understanding.

## 2012-05-11 NOTE — Progress Notes (Signed)
[facility] Telephone:([phone]   [phone]  OFFICE PROGRESS NOTE  [name], MD [address] [place] [address]  Principle Diagnosis: #1 recurrent multiple myeloma IgG kappa subtype diagnosed in June of 2008 #2 history of vasculitis and thrombosis of skin lesions   Prior Therapy: #1 status post palliative radiotherapy to the left hip under the care of Dr. Roselind Messier  #2 status post 5 cycles of systemic chemotherapy with Revlimid and low dose Decadron. Last dose given June 2009 with good response.  #3 status post autologous peripheral blood stem cell transplant at [facility] on 02/02/2008. #4 the patient had evidence for disease recurrence in December 2010.  #5 Revlimid 25 mg by mouth daily for 21 days every 4 weeks in addition to Decadron 40 mg orally on a weekly basis. The patient is status post 28 cycles, discontinued today secondary to disease progression.   Current therapy:  1. Zometa 4 mg IV given every 3 months  2. systemic chemotherapy with Velcade 1.3 mg/M2 on days 1, 4, 8 and 11 in addition to Doxil 30 mg/M2 on day 4 and Decadron 40 mg by mouth on weekly basis every 3 weeks. Status post 1cycle and days 1&4 of cycle 2  INTERVAL HISTORY: [name] 51 y.o. male returns to the clinic today for scheduled followup appointment. He remains concerned about the lesions on his abdomen from the vasculitis. He is concerned that things are not healing as well as they should. He has had some low-grade temperatures with a MAXIMUM TEMPERATURE of 100.1 over the past few days however that resolved with Tylenol. Over the weekend he had significant pain in his feet consisting of a "pins and needles type of feeling". He called the physician on call, Dr. Caron Presume, who increased his Neurontin to 300 mg by mouth 3 times daily. He just started the increased dose yesterday but has noticed some improvement as he is now able to walk on his  feet where her over the weekend he could not due to the pain. He reports decreased appetite and is not eating near as much as he used to. He drinks about a quarter can of Ensure daily to ensure he is getting some "proper nutrients".  Thus far he is tolerating his chemotherapy with Vecade, Doxil and Decadron without difficulty with the exception of decreased energy.Marland KitchenHe is now on therapeutic dose anticoagulation due to his recurrent vasculitis related to his multiple myeloma. He admits to missing 2 doses over the weekend secondary to the significant pain he was having his feet. He has resumed his Coumadin as previously prescribed. His anticoagulation therapy is being managed by the [facility].He denied having any significant fever or chills. No night sweats. No nausea or vomiting.   MEDICAL HISTORY: Past Medical History  Diagnosis Date  . Multiple myeloma 09/24/2011    ALLERGIES:  is allergic to red dye.  MEDICATIONS:  Current Outpatient Prescriptions  Medication Sig Dispense Refill  . albuterol (PROVENTIL) 2 MG tablet       . chlorpheniramine-HYDROcodone (TUSSIONEX) 10-8 MG/5ML LQCR       . clobetasol cream (TEMOVATE) 0.05 % Apply topically 2 (two) times daily. Rub on affected skin areas as directed  30 g  0  . dexamethasone (DECADRON) 4 MG tablet Take 1 tablet (4 mg total) by mouth as directed. 10 tabs every week with chemo.  40 tablet  4  . dexamethasone (  DECADRON) 4 MG tablet TAKE 10 TABLETS BY MOUTH EVERY WEEK WITH CHEMO  40 tablet  4  . doxycycline (VIBRA-TABS) 100 MG tablet       . fondaparinux (ARIXTRA) 10 MG/0.8ML SOLN Inject 0.8 mLs (10 mg total) into the skin daily.  7 Syringe  0  . gabapentin (NEURONTIN) 300 MG capsule Take 1 capsule (300 mg total) by mouth 3 (three) times daily.  60 capsule  1  . HYDROcodone-acetaminophen (VICODIN) 5-500 MG per tablet Take 1 tablet by mouth every 6 (six) hours as needed.        [name] lenalidomide (REVLIMID) 25 MG capsule  Take 1 capsule (25 mg total) by mouth daily. 1 tab po for 21 days every 28 days Auth # - [id number] on 2/13.  21 capsule  0  . lisinopril-hydrochlorothiazide (PRINZIDE,ZESTORETIC) 20-12.5 MG per tablet Take 1 tablet by mouth daily.        [name] omeprazole (PRILOSEC) 40 MG capsule Take 1 capsule (40 mg total) by mouth daily.  30 capsule  3  . ondansetron (ZOFRAN) 8 MG tablet       . potassium chloride SA (K-DUR,KLOR-CON) 20 MEQ tablet Take 1 tablet (20 mEq total) by mouth daily.  7 tablet  0  . warfarin (COUMADIN) 5 MG tablet 1 tablet by mouth daily between 4 - 6 pm or as directed  50 tablet  1    REVIEW OF SYSTEMS:  A comprehensive review of systems was negative except for: Constitutional: positive for fatigue Integument/breast: positive for skin lesion(s)   PHYSICAL EXAMINATION: General appearance: alert, cooperative and no distress Head: Normocephalic, without obvious abnormality, atraumatic Neck: no adenopathy Lymph nodes: Cervical, supraclavicular, and axillary nodes normal. Resp: clear to auscultation bilaterally Cardio: regular rate and rhythm, S1, S2 normal, no murmur, click, rub or gallop GI: Abdomen generally soft however there is asymmetry to the right side of the abdomen with mild to moderate soft tissue swelling underlying the area of vasculitis. The vasculitis is overlying the previous area of vasculitis that the patient had. There is no evidence for super infection. The areas are less tender to palpation. Extremities: extremities normal, atraumatic, no cyanosis or edema Neurologic: Alert and oriented X 3, normal strength and tone. Normal symmetric reflexes. Normal coordination and gait Skin: Examination abdomen reveals vasculitic inflammation over the previous area of vasculitis on the right side of the abdomen. There is more eschar in place no active bleeding or bruising. No purulent drainage or evidence of superinfection. There is surrounding hyperpigmentation There is more decrease in  the previously increased girth/soft tissue swelling on the low right side of the abdomen, this area is now softer on exam and and totally nontender.  ECOG PERFORMANCE STATUS: 1 - Symptomatic but completely ambulatory  Blood pressure 107/68, pulse 99, temperature 98.4 F (36.9 C), temperature source Oral, height 5\' 4"  (1.626 m), weight 238 lb 8 oz (108.183 kg).  LABORATORY DATA: Lab Results  Component Value Date   WBC 2.2* 05/11/2012   HGB 10.9* 05/11/2012   HCT 30.8* 05/11/2012   MCV 89.0 05/11/2012   PLT 95* 05/11/2012      Chemistry      Component Value Date/Time   NA 136 05/04/2012 1414   K 3.8 05/04/2012 1414   CL 101 05/04/2012 1414   CO2 27 05/04/2012 1414   BUN 10 05/04/2012 1414   CREATININE 0.56 05/04/2012 1414      Component Value Date/Time   CALCIUM 9.4 05/04/2012 1414   ALKPHOS 28*  05/04/2012 1414   AST 19 05/04/2012 1414   ALT 20 05/04/2012 1414   BILITOT 0.5 05/04/2012 1414     Other results: Beta-2 microglobulin 2.71, free kappa light chain 73.20, free lambda light chain 0.12, kappa/lambda ratio 610.00, IgG 3740, IgA 52 and IgM 19.  RADIOGRAPHIC STUDIES: No results found.  ASSESSMENT/PLAN: This is a very pleasant 51 years old [place] American male with recurrent multiple myeloma most recently treated with Revlimid and Decadron status post 28 cycles. Unfortunately the patient has evidence for disease progression based on the last myeloma panel. The patient is now receiving systemic chemotherapy in the form of subcutaneous Velcade given on days 1,4,8 and 11 as well as Doxil given IV on day 4 and Decadron taken by mouth on a weekly basis. The patient was discussed with Dr. Arline Asp in Dr. Asa Lente absence. He will continue on full dose anticoagulation and is to resume his current dose as prescribed and as [facility]. His ANC is slightly low at 1.4 today as well as his platelet count being slightly low at 95,000. Per Dr. meters and we will hold  his Velcade today and he is to return on Thursday of this week with repeat CBC differential and C. met and we'll proceed with his Velcade as scheduled if his counts are acceptable. He'll followup with Dr. Arbutus Ped in 2 weeks with repeat CBC differential and C. met and LDH and to discuss the results of the myeloma panel that we have drawn today to reevaluate his disease. He'll also be seen by the Coumadin clinic on 05/25/2012 as well with a repeat PT/ INR to evaluate his Coumadin therapy. We will continue to monitor the lesions on his abdomen closely and will refer him to a dermatologist or vascular specialist if needed.  [name], [name] Radke E, PA-C   All questions were answered. The patient knows to call the clinic with any problems, questions or concerns. We can certainly see the patient much sooner if necessary.  I spent 20 minutes counseling the patient face to face. The total time spent in the appointment was 30 minutes.

## 2012-05-11 NOTE — Progress Notes (Signed)
INR = 1.6 drawn today but not due to see pharmacist in Coumadin clinic.  INR drawn w/ CBC today. Pt taking Coumadin 7.5 mg/day but per Adrena (who saw pt today prior to tx), pt missed a few doses of Coumadin over the weekend. INR below goal but will resume his usual dose of 7.5 mg as this produced therapeutic INR's. Recheck INR on 05/25/12 when due for next cycle of Velcade. Lab will credit pts INR today. Marily Lente, Pharm.D.

## 2012-05-13 ENCOUNTER — Telehealth: Payer: Self-pay | Admitting: Medical Oncology

## 2012-05-13 ENCOUNTER — Encounter: Payer: Self-pay | Admitting: Physician Assistant

## 2012-05-13 ENCOUNTER — Ambulatory Visit (HOSPITAL_COMMUNITY)
Admission: RE | Admit: 2012-05-13 | Discharge: 2012-05-13 | Disposition: A | Discharge status: Home / Self Care | Payer: 59 | Source: Ambulatory Visit | Attending: Physician Assistant | Admitting: Physician Assistant

## 2012-05-13 ENCOUNTER — Other Ambulatory Visit: Payer: Self-pay | Admitting: Medical Oncology

## 2012-05-13 DIAGNOSIS — C9000 Multiple myeloma not having achieved remission: Secondary | ICD-10-CM

## 2012-05-13 DIAGNOSIS — M79609 Pain in unspecified limb: Secondary | ICD-10-CM

## 2012-05-13 DIAGNOSIS — I776 Arteritis, unspecified: Secondary | ICD-10-CM

## 2012-05-13 LAB — COMPREHENSIVE METABOLIC PANEL
AST: 49 U/L — ABNORMAL HIGH (ref 0–37)
Albumin: 3.9 g/dL (ref 3.5–5.2)
Alkaline Phosphatase: 31 U/L — ABNORMAL LOW (ref 39–117)
BUN: 34 mg/dL — ABNORMAL HIGH (ref 6–23)
Calcium: 8.7 mg/dL (ref 8.4–10.5)
Chloride: 98 mEq/L (ref 96–112)
Glucose, Bld: 108 mg/dL — ABNORMAL HIGH (ref 70–99)
Potassium: 3.4 mEq/L — ABNORMAL LOW (ref 3.5–5.3)
Sodium: 131 mEq/L — ABNORMAL LOW (ref 135–145)
Total Protein: 9.5 g/dL — ABNORMAL HIGH (ref 6.0–8.3)

## 2012-05-13 LAB — SPEP & IFE WITH QIG
Albumin ELP: 42.5 % — ABNORMAL LOW (ref 55.8–66.1)
Alpha-1-Globulin: 5.3 % — ABNORMAL HIGH (ref 2.9–4.9)
IgA: <6 mg/dL — ABNORMAL LOW (ref 68–379)
IgM, Serum: <4 mg/dL — ABNORMAL LOW (ref 41–251)
Total Protein, Serum Electrophoresis: 9.5 g/dL — ABNORMAL HIGH (ref 6.0–8.3)

## 2012-05-13 LAB — KAPPA/LAMBDA LIGHT CHAINS: Lambda Free Lght Chn: 0.03 mg/dL — ABNORMAL LOW (ref 0.57–2.63)

## 2012-05-13 NOTE — Progress Notes (Signed)
VASCULAR LAB PRELIMINARY  PRELIMINARY  PRELIMINARY  PRELIMINARY  Bilateral lower extremity venous duplex completed.    Preliminary report:  Bilateral:  No evidence of DVTor superficial thrombosis. There is a small Baker's cyst in the right popliteal fossa measuring 1.5cm X 0.69 cm. There is no evidence of a left Baker's cyst   Kevon Tench, RVS 05/13/2012, 2:26 PM

## 2012-05-13 NOTE — Progress Notes (Signed)
Patient found complaining of increased lower extremity pain and tenderness. The patient was set up to have bilateral lower extremity Dopplers to be evaluated for possible deep vein thromboses. The study was negative for deep vein thromboses but did reveal a tiny right Baker's cyst not felt to be occluding anything. Patient was told to take his pain medication as prescribed. If the pain persists we may obtain plain films look for possible lytic lesions of the long bones as the culprit for his pain.  Laural Benes, Artie Mcintyre E , PA-C

## 2012-05-13 NOTE — Telephone Encounter (Addendum)
Reports pain in feet and both calves described as deep ache.Denies redness, warmth to calves. He was awake all night with the pain , soaked in a tub, took hydrocodone and tylenol without relief. Per  Adrena I scheduled bilateral venous ultrasound with Cindy for 1 pm today and pt notified. Pt instructed to go to admitting at 1245.

## 2012-05-14 ENCOUNTER — Encounter: Payer: Self-pay | Admitting: Physician Assistant

## 2012-05-14 ENCOUNTER — Ambulatory Visit: Payer: 59

## 2012-05-14 ENCOUNTER — Telehealth: Payer: Self-pay | Admitting: Internal Medicine

## 2012-05-14 ENCOUNTER — Other Ambulatory Visit: Payer: Self-pay | Admitting: Medical Oncology

## 2012-05-14 ENCOUNTER — Other Ambulatory Visit: Payer: Self-pay | Admitting: Physician Assistant

## 2012-05-14 ENCOUNTER — Ambulatory Visit (HOSPITAL_BASED_OUTPATIENT_CLINIC_OR_DEPARTMENT_OTHER): Payer: 59 | Admitting: Pharmacist

## 2012-05-14 ENCOUNTER — Other Ambulatory Visit: Payer: 59 | Admitting: Lab

## 2012-05-14 ENCOUNTER — Telehealth: Payer: Self-pay | Admitting: *Deleted

## 2012-05-14 ENCOUNTER — Other Ambulatory Visit: Payer: 59

## 2012-05-14 VITALS — BP 117/80 | HR 85 | Temp 97.3°F

## 2012-05-14 DIAGNOSIS — I776 Arteritis, unspecified: Secondary | ICD-10-CM

## 2012-05-14 DIAGNOSIS — C9000 Multiple myeloma not having achieved remission: Secondary | ICD-10-CM

## 2012-05-14 DIAGNOSIS — R52 Pain, unspecified: Secondary | ICD-10-CM

## 2012-05-14 LAB — CBC WITH DIFFERENTIAL/PLATELET
BASO%: 0 % (ref 0.0–2.0)
EOS%: 0 % (ref 0.0–7.0)
HCT: 30.6 % — ABNORMAL LOW (ref 38.4–49.9)
LYMPH%: 32.8 % (ref 14.0–49.0)
MCH: 31.4 pg (ref 27.2–33.4)
MCHC: 35 g/dL (ref 32.0–36.0)
MCV: 89.7 fL (ref 79.3–98.0)
MONO#: 0.5 10*3/uL (ref 0.1–0.9)
MONO%: 25.7 % — ABNORMAL HIGH (ref 0.0–14.0)
NEUT%: 41.5 % (ref 39.0–75.0)
Platelets: 109 10*3/uL — ABNORMAL LOW (ref 140–400)
RBC: 3.41 10*6/uL — ABNORMAL LOW (ref 4.20–5.82)
WBC: 1.8 10*3/uL — ABNORMAL LOW (ref 4.0–10.3)

## 2012-05-14 LAB — COMPREHENSIVE METABOLIC PANEL
ALT: 27 U/L (ref 0–53)
Alkaline Phosphatase: 34 U/L — ABNORMAL LOW (ref 39–117)
CO2: 24 mEq/L (ref 19–32)
Creatinine, Ser: 0.54 mg/dL (ref 0.50–1.35)
Sodium: 128 mEq/L — ABNORMAL LOW (ref 135–145)
Total Bilirubin: 0.4 mg/dL (ref 0.3–1.2)
Total Protein: 9.4 g/dL — ABNORMAL HIGH (ref 6.0–8.3)

## 2012-05-14 LAB — PROTIME-INR: Protime: 24 Seconds — ABNORMAL HIGH (ref 10.6–13.4)

## 2012-05-14 LAB — POCT INR: INR: 2

## 2012-05-14 MED ORDER — BORTEZOMIB CHEMO SQ INJECTION 3.5 MG (2.5MG/ML): 1.0000 mg/m2 | Freq: Once | INTRAMUSCULAR | Status: DC

## 2012-05-14 MED ORDER — ONDANSETRON HCL 8 MG PO TABS: 8.0000 mg | ORAL_TABLET | Freq: Once | ORAL | Status: DC

## 2012-05-14 MED ORDER — MORPHINE SULFATE ER 15 MG PO TBCR: 15.0000 mg | EXTENDED_RELEASE_TABLET | Freq: Two times a day (BID) | ORAL | Status: DC

## 2012-05-14 NOTE — Progress Notes (Signed)
Continue same dose of 7.5mg  (1&1/2 tablets) daily.  Check PT/INR on 05/25/12.  Will see you in infusion room. Pt credited for today's lab charge for PT/INR.

## 2012-05-14 NOTE — Patient Instructions (Signed)
Continue same dose of 7.5mg  (1&1/2 tablets) daily.  Check PT/INR on 05/25/12.  Will see you in infusion room.

## 2012-05-14 NOTE — Telephone Encounter (Signed)
appts made and printed for pt aom °

## 2012-05-14 NOTE — Telephone Encounter (Signed)
Per staff message I have scheduled appts. [name]  

## 2012-05-14 NOTE — Progress Notes (Signed)
Patient presented to receive his subcutaneous Velcade however his ANC was even lower than it was on 05/11/2012 when it was 1.4 today the total white count was 1.8 with an ANC of 0.8. Patient was discussed with Dr. Melvyn Neth and subcutaneous Velcade was held. Patient complained of continued lower extremity pain and he was started on MS Contin at 15 mg by mouth every 12 hours. He is to continue his gabapentin and Vicodin as previously prescribed. He continues to complain of slow healing of his vasculitic lesions on the abdomen. We'll refer him to the wound care center for a second opinion and further management. The patient and his wife also states that they may want to seek a second opinion regarding his multiple myeloma. They will let us know if and when they decide to seek a second opinion and where his of record should be sent.  Laural Benes, Kervin Bones E, PA-C

## 2012-05-14 NOTE — Telephone Encounter (Signed)
rx given to pt

## 2012-05-18 ENCOUNTER — Other Ambulatory Visit: Payer: Self-pay | Admitting: Certified Registered Nurse Anesthetist

## 2012-05-25 ENCOUNTER — Telehealth: Payer: Self-pay | Admitting: Internal Medicine

## 2012-05-25 ENCOUNTER — Encounter: Payer: Self-pay | Admitting: Physician Assistant

## 2012-05-25 ENCOUNTER — Ambulatory Visit: Payer: 59

## 2012-05-25 ENCOUNTER — Ambulatory Visit (HOSPITAL_BASED_OUTPATIENT_CLINIC_OR_DEPARTMENT_OTHER): Payer: 59 | Admitting: Physician Assistant

## 2012-05-25 ENCOUNTER — Other Ambulatory Visit (HOSPITAL_BASED_OUTPATIENT_CLINIC_OR_DEPARTMENT_OTHER): Payer: 59 | Admitting: Lab

## 2012-05-25 ENCOUNTER — Ambulatory Visit: Payer: 59 | Admitting: Pharmacist

## 2012-05-25 VITALS — BP 114/76 | HR 106 | Temp 97.3°F | Ht 64.0 in | Wt 236.0 lb

## 2012-05-25 DIAGNOSIS — G609 Hereditary and idiopathic neuropathy, unspecified: Secondary | ICD-10-CM

## 2012-05-25 DIAGNOSIS — I776 Arteritis, unspecified: Secondary | ICD-10-CM

## 2012-05-25 DIAGNOSIS — C9000 Multiple myeloma not having achieved remission: Secondary | ICD-10-CM

## 2012-05-25 DIAGNOSIS — D702 Other drug-induced agranulocytosis: Secondary | ICD-10-CM

## 2012-05-25 DIAGNOSIS — Z7901 Long term (current) use of anticoagulants: Secondary | ICD-10-CM

## 2012-05-25 LAB — CBC WITH DIFFERENTIAL/PLATELET
Basophils Absolute: 0 10*3/uL (ref 0.0–0.1)
EOS%: 0.5 % (ref 0.0–7.0)
Eosinophils Absolute: 0 10*3/uL (ref 0.0–0.5)
HCT: 29.3 % — ABNORMAL LOW (ref 38.4–49.9)
HGB: 10.2 g/dL — ABNORMAL LOW (ref 13.0–17.1)
MCH: 31 pg (ref 27.2–33.4)
MCV: 89.1 fL (ref 79.3–98.0)
NEUT#: 1 10*3/uL — ABNORMAL LOW (ref 1.5–6.5)
NEUT%: 46.5 % (ref 39.0–75.0)
lymph#: 0.6 10*3/uL — ABNORMAL LOW (ref 0.9–3.3)

## 2012-05-25 LAB — PROTIME-INR: INR: 3.3 (ref 2.00–3.50)

## 2012-05-25 LAB — POCT INR: INR: 3.3

## 2012-05-25 MED ORDER — HYDROCODONE-ACETAMINOPHEN 5-500 MG PO TABS: 1.0000 | ORAL_TABLET | Freq: Four times a day (QID) | ORAL | Status: DC | PRN

## 2012-05-25 MED ORDER — AMITRIPTYLINE HCL 50 MG PO TABS: 50.0000 mg | ORAL_TABLET | Freq: Every day | ORAL | Status: DC

## 2012-05-25 NOTE — Telephone Encounter (Signed)
gv pt appt schedule for July/August. °

## 2012-05-25 NOTE — Progress Notes (Signed)
INR = 3.3 on 7.5 mg/day Pt will not be treated today; ANC too low. Pt had small salad this past week but no other significant changes related to Coumadin tx. INR a little high today; hold x 1 dose then resume 7.5 mg/day. Repeat INR next week w/ labs prior to tx.  We can see him in infusion room next week.  Pt aware. Marily Lente, Pharm.D.

## 2012-05-28 ENCOUNTER — Inpatient Hospital Stay (HOSPITAL_COMMUNITY)
Admission: EM | Admit: 2012-05-28 | Discharge: 2012-05-30 | DRG: 153 | Disposition: A | Discharge status: Home / Self Care | Payer: 59 | Attending: Pulmonary Disease | Admitting: Pulmonary Disease

## 2012-05-28 ENCOUNTER — Ambulatory Visit: Payer: 59

## 2012-05-28 DIAGNOSIS — G8929 Other chronic pain: Secondary | ICD-10-CM | POA: Diagnosis present

## 2012-05-28 DIAGNOSIS — J0510 Acute epiglottitis without obstruction: Secondary | ICD-10-CM

## 2012-05-28 DIAGNOSIS — I10 Essential (primary) hypertension: Secondary | ICD-10-CM | POA: Diagnosis present

## 2012-05-28 DIAGNOSIS — Z7901 Long term (current) use of anticoagulants: Secondary | ICD-10-CM

## 2012-05-28 DIAGNOSIS — C9000 Multiple myeloma not having achieved remission: Secondary | ICD-10-CM | POA: Diagnosis present

## 2012-05-28 DIAGNOSIS — Z79899 Other long term (current) drug therapy: Secondary | ICD-10-CM

## 2012-05-28 DIAGNOSIS — J0431 Supraglottitis, unspecified, with obstruction: Principal | ICD-10-CM | POA: Diagnosis present

## 2012-05-28 DIAGNOSIS — I776 Arteritis, unspecified: Secondary | ICD-10-CM | POA: Diagnosis present

## 2012-05-28 DIAGNOSIS — R0989 Other specified symptoms and signs involving the circulatory and respiratory systems: Secondary | ICD-10-CM | POA: Diagnosis present

## 2012-05-28 DIAGNOSIS — R0609 Other forms of dyspnea: Secondary | ICD-10-CM | POA: Diagnosis present

## 2012-05-28 HISTORY — DX: Essential (primary) hypertension: I10

## 2012-05-28 MED ORDER — DIPHENHYDRAMINE HCL 50 MG/ML IJ SOLN
INTRAMUSCULAR | Status: AC
  Administered 2012-05-29: 25 mg
  Filled 2012-05-28: qty 1

## 2012-05-28 MED ORDER — METHYLPREDNISOLONE SODIUM SUCC 125 MG IJ SOLR
INTRAMUSCULAR | Status: AC
  Administered 2012-05-29
  Filled 2012-05-28: qty 2

## 2012-05-28 MED ORDER — DIPHENHYDRAMINE HCL 50 MG/ML IJ SOLN
INTRAMUSCULAR | Status: AC
  Filled 2012-05-28: qty 1

## 2012-05-28 MED ORDER — METHYLPREDNISOLONE SODIUM SUCC 125 MG IJ SOLR
INTRAMUSCULAR | Status: AC
  Filled 2012-05-28: qty 2

## 2012-05-28 MED ORDER — FAMOTIDINE IN NACL 20-0.9 MG/50ML-% IV SOLN
INTRAVENOUS | Status: AC
  Administered 2012-05-29
  Filled 2012-05-28: qty 50

## 2012-05-28 MED ORDER — FAMOTIDINE IN NACL 20-0.9 MG/50ML-% IV SOLN
INTRAVENOUS | Status: AC
  Filled 2012-05-28: qty 50

## 2012-05-28 NOTE — ED Notes (Signed)
Pt states feels like throat is closing.  Tightness in airway.  Pt has cancer myeloma and is under tx.  Pt is distress at this time.  Transfer to the back to continue .

## 2012-05-29 ENCOUNTER — Encounter (HOSPITAL_COMMUNITY): Payer: Self-pay | Admitting: *Deleted

## 2012-05-29 ENCOUNTER — Emergency Department (HOSPITAL_COMMUNITY): Payer: 59

## 2012-05-29 ENCOUNTER — Encounter: Payer: Self-pay | Admitting: Internal Medicine

## 2012-05-29 DIAGNOSIS — I10 Essential (primary) hypertension: Secondary | ICD-10-CM | POA: Diagnosis present

## 2012-05-29 DIAGNOSIS — I776 Arteritis, unspecified: Secondary | ICD-10-CM

## 2012-05-29 DIAGNOSIS — J0510 Acute epiglottitis without obstruction: Secondary | ICD-10-CM

## 2012-05-29 DIAGNOSIS — G8929 Other chronic pain: Secondary | ICD-10-CM | POA: Diagnosis present

## 2012-05-29 DIAGNOSIS — Z7901 Long term (current) use of anticoagulants: Secondary | ICD-10-CM

## 2012-05-29 DIAGNOSIS — C9000 Multiple myeloma not having achieved remission: Secondary | ICD-10-CM

## 2012-05-29 LAB — GLUCOSE, CAPILLARY
Glucose-Capillary: 125 mg/dL — ABNORMAL HIGH (ref 70–99)
Glucose-Capillary: 114 mg/dL — ABNORMAL HIGH (ref 70–99)

## 2012-05-29 LAB — BLOOD GAS, ARTERIAL
Acid-base deficit: 0.2 mmol/L (ref 0.0–2.0)
Bicarbonate: 22.7 mEq/L (ref 20.0–24.0)
O2 Saturation: 97.5 %
Patient temperature: 98.6
TCO2: 20.6 mmol/L (ref 0–100)

## 2012-05-29 LAB — COMPREHENSIVE METABOLIC PANEL
ALT: 18 U/L (ref 0–53)
AST: 31 U/L (ref 0–37)
Alkaline Phosphatase: 27 U/L — ABNORMAL LOW (ref 39–117)
CO2: 24 mEq/L (ref 19–32)
Chloride: 93 mEq/L — ABNORMAL LOW (ref 96–112)
GFR calc non Af Amer: >90 mL/min (ref >90)
Glucose, Bld: 102 mg/dL — ABNORMAL HIGH (ref 70–99)
Sodium: 127 mEq/L — ABNORMAL LOW (ref 135–145)
Total Bilirubin: 0.7 mg/dL (ref 0.3–1.2)

## 2012-05-29 LAB — PROTIME-INR: Prothrombin Time: 25.3 seconds — ABNORMAL HIGH (ref 11.6–15.2)

## 2012-05-29 LAB — CBC
Hemoglobin: 10 g/dL — ABNORMAL LOW (ref 13.0–17.0)
Hemoglobin: 11.3 g/dL — ABNORMAL LOW (ref 13.0–17.0)
Platelets: 202 10*3/uL (ref 150–400)
RBC: 3.23 MIL/uL — ABNORMAL LOW (ref 4.22–5.81)
RBC: 3.5 MIL/uL — ABNORMAL LOW (ref 4.22–5.81)
WBC: 2 10*3/uL — ABNORMAL LOW (ref 4.0–10.5)

## 2012-05-29 LAB — POCT I-STAT, CHEM 8
Chloride: 99 mEq/L (ref 96–112)
Creatinine, Ser: 0.8 mg/dL (ref 0.50–1.35)
Hemoglobin: 11.9 g/dL — ABNORMAL LOW (ref 13.0–17.0)
Potassium: 3.8 mEq/L (ref 3.5–5.1)
Sodium: 133 mEq/L — ABNORMAL LOW (ref 135–145)

## 2012-05-29 LAB — HEPARIN LEVEL (UNFRACTIONATED): Heparin Unfractionated: 0.41 IU/mL (ref 0.30–0.70)

## 2012-05-29 LAB — BASIC METABOLIC PANEL
Chloride: 93 mEq/L — ABNORMAL LOW (ref 96–112)
GFR calc Af Amer: >90 mL/min (ref >90)
Potassium: 3.1 mEq/L — ABNORMAL LOW (ref 3.5–5.1)

## 2012-05-29 LAB — MRSA PCR SCREENING: MRSA by PCR: NEGATIVE

## 2012-05-29 LAB — PROCALCITONIN: Procalcitonin: 0.13 ng/mL

## 2012-05-29 MED ORDER — INSULIN ASPART 100 UNIT/ML ~~LOC~~ SOLN: 0.0000 [IU] | Freq: Every day | SUBCUTANEOUS | Status: DC

## 2012-05-29 MED ORDER — POTASSIUM CHLORIDE 10 MEQ/100ML IV SOLN
10.0000 meq | INTRAVENOUS | Status: AC
  Administered 2012-05-29 (×4): 10 meq via INTRAVENOUS
  Filled 2012-05-29: qty 400

## 2012-05-29 MED ORDER — BIOTENE DRY MOUTH MT LIQD
15.0000 mL | Freq: Two times a day (BID) | OROMUCOSAL | Status: DC
  Administered 2012-05-29 – 2012-05-30 (×3): 15 mL via OROMUCOSAL

## 2012-05-29 MED ORDER — PHENOL 1.4 % MT LIQD: 1.0000 | OROMUCOSAL | Status: DC | PRN

## 2012-05-29 MED ORDER — EPINEPHRINE 0.3 MG/0.3ML IJ DEVI
0.3000 mg | Freq: Once | INTRAMUSCULAR | Status: AC
  Administered 2012-05-29: 0.3 mg via INTRAMUSCULAR

## 2012-05-29 MED ORDER — HEPARIN (PORCINE) IN NACL 100-0.45 UNIT/ML-% IJ SOLN
15.0000 [IU]/kg/h | INTRAMUSCULAR | Status: DC
  Administered 2012-05-29: 15 [IU]/kg/h via INTRAVENOUS
  Filled 2012-05-29: qty 250

## 2012-05-29 MED ORDER — FAMOTIDINE IN NACL 20-0.9 MG/50ML-% IV SOLN: 20.0000 mg | Freq: Two times a day (BID) | INTRAVENOUS | Status: DC

## 2012-05-29 MED ORDER — PIPERACILLIN-TAZOBACTAM 3.375 G IVPB
3.3750 g | Freq: Once | INTRAVENOUS | Status: AC
  Administered 2012-05-29: 3.375 g via INTRAVENOUS
  Filled 2012-05-29: qty 50

## 2012-05-29 MED ORDER — SODIUM CHLORIDE 0.9 % IV SOLN
INTRAVENOUS | Status: DC
  Administered 2012-05-29: 03:00:00 via INTRAVENOUS
  Administered 2012-05-29: 100 mL/h via INTRAVENOUS

## 2012-05-29 MED ORDER — DEXAMETHASONE SODIUM PHOSPHATE 10 MG/ML IJ SOLN
10.0000 mg | Freq: Three times a day (TID) | INTRAMUSCULAR | Status: DC
  Administered 2012-05-29 – 2012-05-30 (×4): 10 mg via INTRAVENOUS
  Filled 2012-05-29 (×6): qty 1

## 2012-05-29 MED ORDER — RACEPINEPHRINE HCL 2.25 % IN NEBU: 0.5000 mL | INHALATION_SOLUTION | RESPIRATORY_TRACT | Status: DC | PRN

## 2012-05-29 MED ORDER — DEXAMETHASONE SODIUM PHOSPHATE 10 MG/ML IJ SOLN
10.0000 mg | Freq: Once | INTRAMUSCULAR | Status: AC
  Administered 2012-05-29: 10 mg via INTRAVENOUS

## 2012-05-29 MED ORDER — MORPHINE SULFATE 2 MG/ML IJ SOLN
2.0000 mg | INTRAMUSCULAR | Status: DC | PRN
  Administered 2012-05-29 – 2012-05-30 (×3): 2 mg via INTRAVENOUS
  Filled 2012-05-29 (×4): qty 1

## 2012-05-29 MED ORDER — WARFARIN SODIUM 5 MG IV SOLR
5.0000 mg | Freq: Once | INTRAVENOUS | Status: AC
  Administered 2012-05-29: 5 mg via INTRAVENOUS
  Filled 2012-05-29: qty 2.5

## 2012-05-29 MED ORDER — DEXAMETHASONE SODIUM PHOSPHATE 10 MG/ML IJ SOLN
INTRAMUSCULAR | Status: AC
  Administered 2012-05-29: 10 mg via INTRAVENOUS
  Filled 2012-05-29: qty 1

## 2012-05-29 MED ORDER — DIPHENHYDRAMINE HCL 50 MG/ML IJ SOLN
25.0000 mg | Freq: Four times a day (QID) | INTRAMUSCULAR | Status: DC
  Administered 2012-05-29 – 2012-05-30 (×3): 25 mg via INTRAVENOUS
  Filled 2012-05-29 (×2): qty 0.5
  Filled 2012-05-29: qty 1
  Filled 2012-05-29: qty 0.5
  Filled 2012-05-29: qty 1
  Filled 2012-05-29: qty 0.5
  Filled 2012-05-29: qty 1

## 2012-05-29 MED ORDER — DEXAMETHASONE SODIUM PHOSPHATE 4 MG/ML IJ SOLN: 4.0000 mg | Freq: Four times a day (QID) | INTRAMUSCULAR | Status: DC

## 2012-05-29 MED ORDER — POTASSIUM CHLORIDE IN NACL 40-0.9 MEQ/L-% IV SOLN
INTRAVENOUS | Status: DC
  Administered 2012-05-29 – 2012-05-30 (×2): via INTRAVENOUS
  Filled 2012-05-29 (×3): qty 1000

## 2012-05-29 MED ORDER — RACEPINEPHRINE HCL 2.25 % IN NEBU
0.5000 mL | INHALATION_SOLUTION | Freq: Once | RESPIRATORY_TRACT | Status: AC
  Administered 2012-05-29: 0.5 mL via RESPIRATORY_TRACT
  Filled 2012-05-29: qty 0.5

## 2012-05-29 MED ORDER — INSULIN ASPART 100 UNIT/ML ~~LOC~~ SOLN: 0.0000 [IU] | Freq: Three times a day (TID) | SUBCUTANEOUS | Status: DC

## 2012-05-29 MED ORDER — INSULIN ASPART 100 UNIT/ML ~~LOC~~ SOLN
0.0000 [IU] | SUBCUTANEOUS | Status: DC
  Administered 2012-05-29: 2 [IU] via SUBCUTANEOUS

## 2012-05-29 MED ORDER — VANCOMYCIN HCL IN DEXTROSE 1-5 GM/200ML-% IV SOLN
1000.0000 mg | Freq: Once | INTRAVENOUS | Status: AC
  Administered 2012-05-29: 1000 mg via INTRAVENOUS
  Filled 2012-05-29: qty 200

## 2012-05-29 MED ORDER — FAMOTIDINE IN NACL 20-0.9 MG/50ML-% IV SOLN
20.0000 mg | Freq: Two times a day (BID) | INTRAVENOUS | Status: DC
  Administered 2012-05-29 – 2012-05-30 (×3): 20 mg via INTRAVENOUS
  Filled 2012-05-29 (×4): qty 50

## 2012-05-29 MED ORDER — BIOTENE DRY MOUTH MT LIQD
15.0000 mL | Freq: Two times a day (BID) | OROMUCOSAL | Status: DC
  Administered 2012-05-29 (×2): 15 mL via OROMUCOSAL

## 2012-05-29 MED ORDER — WARFARIN - PHARMACIST DOSING INPATIENT: Freq: Every day | Status: DC

## 2012-05-29 MED ORDER — ALBUTEROL SULFATE (5 MG/ML) 0.5% IN NEBU
5.0000 mg | INHALATION_SOLUTION | Freq: Once | RESPIRATORY_TRACT | Status: AC
  Administered 2012-05-29: 5 mg via RESPIRATORY_TRACT

## 2012-05-29 MED ORDER — DIPHENHYDRAMINE HCL 50 MG/ML IJ SOLN
25.0000 mg | INTRAMUSCULAR | Status: DC
  Administered 2012-05-29 (×3): 25 mg via INTRAVENOUS
  Filled 2012-05-29 (×3): qty 1

## 2012-05-29 MED ORDER — MORPHINE SULFATE 2 MG/ML IJ SOLN
2.0000 mg | Freq: Once | INTRAMUSCULAR | Status: AC
  Administered 2012-05-29: 2 mg via INTRAVENOUS
  Filled 2012-05-29: qty 1

## 2012-05-29 MED ORDER — DEXTROSE 5 % IV SOLN
2.0000 g | Freq: Every day | INTRAVENOUS | Status: DC
  Administered 2012-05-29 (×2): 2 g via INTRAVENOUS
  Filled 2012-05-29 (×3): qty 2

## 2012-05-29 MED ORDER — ONDANSETRON HCL 4 MG/2ML IJ SOLN
4.0000 mg | Freq: Once | INTRAMUSCULAR | Status: AC
  Administered 2012-05-29: 4 mg via INTRAVENOUS
  Filled 2012-05-29: qty 2

## 2012-05-29 MED ORDER — VANCOMYCIN HCL IN DEXTROSE 1-5 GM/200ML-% IV SOLN
1000.0000 mg | Freq: Three times a day (TID) | INTRAVENOUS | Status: DC
  Administered 2012-05-29: 1000 mg via INTRAVENOUS
  Filled 2012-05-29 (×2): qty 200

## 2012-05-29 NOTE — H&P (Signed)
Name: [name] MRN: [id number] DOB: 01/05/1961    LOS: 1  Referring Provider:  EDP Reason for Referral:  Epiglottitis  PULMONARY / CRITICAL CARE MEDICINE  HPI:  51 yo with multiple myeloma and vasculitis on Coumadin brought to [facility] ED after having difficulty breathing last night.  Apparently had a sore throat for several days which got progressively worth.  Also reports subjective fever, but did not check temperature.  Unable to swallow for several days.  No wheezing, phlegm production or chest pain.  Supine position aggravates dyspnea.  There are no alleviating factors.  Past Medical History  Diagnosis Date  . Multiple myeloma 09/24/2011  . Hypertension    History reviewed. No pertinent past surgical history. Prior to Admission medications   Medication Sig Start Date End Date Taking? Authorizing Provider  amitriptyline (ELAVIL) 50 MG tablet Take 1 tablet (50 mg total) by mouth at bedtime. 05/25/12 05/25/13 Yes Conni Slipper, PA  benzonatate (TESSALON) 100 MG capsule Take 100 mg by mouth 3 (three) times daily as needed. For cough   Yes Historical Provider, MD  dexamethasone (DECADRON) 4 MG tablet Take 1 tablet (4 mg total) by mouth as directed. 10 tabs every week with chemo. 10/07/11  Yes Si Gaul, MD  gabapentin (NEURONTIN) 300 MG capsule Take 1 capsule (300 mg total) by mouth 3 (three) times daily. 05/10/12 05/10/13 Yes Pierce Crane, MD  HYDROcodone-acetaminophen (VICODIN) 5-500 MG per tablet Take 1 tablet by mouth every 6 (six) hours as needed. 05/25/12  Yes Conni Slipper, PA  lisinopril-hydrochlorothiazide (PRINZIDE,ZESTORETIC) 20-12.5 MG per tablet Take 1 tablet by mouth daily.     Yes Historical Provider, MD  morphine (MS CONTIN) 15 MG 12 hr tablet Take 1 tablet (15 mg total) by mouth 2 (two) times daily. 05/14/12  Yes Conni Slipper, PA  omeprazole (PRILOSEC) 40 MG capsule Take 1 capsule (40 mg total) by mouth daily. 02/18/12  Yes Conni Slipper, PA  potassium chloride SA  (K-DUR,KLOR-CON) 20 MEQ tablet Take 20 mEq by mouth daily as needed. Patients wife states that he only takes potassium when he is told that his potassium levels are low.   Yes Historical Provider, MD  warfarin (COUMADIN) 5 MG tablet Take 7.5 mg by mouth daily.   Yes Historical Provider, MD   Allergies Allergies  Allergen Reactions  . Red Dye Anaphylaxis    Lips swollen  3-4 times their baseline size  . Other Other (See Comments)    Strawberries "anything containing red dye"   Family History No family history on file.  Social History  reports that he has never smoked. He does not have any smokeless tobacco history on file. His alcohol and drug histories not on file.  Review Of Systems:  As per HPI.  Additionally reports poorly healing ulcer on the abdominal wall.  Otherwise negative.  Brief patient description:  51 yo with multiple myeloma and vasculitis on Coumadin brought to [facility] ED after having difficulty breathing last night.  Apparently had a sore throat for several days which got progressively worth.  Also reports subjective fever, but did not check temperature.  Unable to swallow for several days.  No wheezing, phlegm production or chest pain.  Supine position aggravates dyspnea.  There are no alleviating factors.  Events Since Admission: 7/12  Admitted with sore throat and difficulty breathing  Current Status:  Vital Signs: Temp:  [99.5 F (37.5 C)] 99.5 F (37.5 C) (07/11 2346) Pulse Rate:  [114-123] 123  (  07/12 0101) Resp:  [18-21] 21  (07/12 0101) BP: (125-139)/(55-83) 125/55 mmHg (07/12 0101) SpO2:  [100 %] 100 % (07/12 0101)  Physical Examination: General:  No acute distress, drooling form the mouth, hoarse voice Neuro:  Awake, alert, cooperative HEENT:  Crowded oropharynx, cannot appreciate exudate or erythema Neck:  No lymphadenopathy / no tenderness to palpation Cardiovascular:  RRR Lungs:  CTAB Abdomen:  Obese, nontender, bowel sounds present Musculoskeletal:   Moves all extremities, no edema Skin:  Anterior abdominal wall ulcer covered with eschar   Active Problems:  Multiple myeloma  Vasculitis  Epiglottitis  Chronic anticoagulation  Chronic pain  Hypertension  ASSESSMENT AND PLAN  PULMONARY  Lab 05/29/12 0010  PHART 7.458*  PCO2ART 32.6*  PO2ART 103.0*  HCO3 22.7  O2SAT 97.5   Ventilator Settings:   CXR:  7/12 >>> nad Neck XR:  7/12 >>> Prominent epiglottis  ETT:  NA  A:  No evidence of pulmonary disease.  Suspected epiglottitis / pharyngitis. Doubt allergic reaction / angioedema (on Lisinopril preadmission). No evidence of airway significant airway compromise at this time. P:   Admit to ICU ENT evaluation Decadron 10 mg q8h Pepcid / Benadryl (per ENT) ABx per ID section May need soft tissue neck CT when able tolerate supine position Hold Lisinopril  CARDIOVASCULAR  Lab 05/29/12 0150  TROPONINI --  LATICACIDVEN 0.8  PROBNP --   ECG:  NA Lines: NA  A: History of hypertension.  Hemodynamically stable.  No evidence of arrhythmia / ischemia.  History of hypertension. P:  May consider using regimen other then ACEI  RENAL  Lab 05/29/12 0011 05/29/12 0001  NA 133* 127*  K 3.8 3.6  CL 99 93*  CO2 -- 24  BUN 18 14  CREATININE 0.80 0.71  CALCIUM -- 9.2  MG -- --  PHOS -- --   Intake/Output    None    Foley:  NA  A:  Normal renal function. P:   BMP in AM Maintenance IVF NS 100 mL/h  GASTROINTESTINAL  Lab 05/29/12 0001  AST 31  ALT 18  ALKPHOS 27*  BILITOT 0.7  PROT 10.2*  ALBUMIN 3.4*   A:  No active issues. P:   NPO for now  HEMATOLOGIC  Lab 05/29/12 0011 05/29/12 0001 05/25/12 1350 05/25/12  HGB 11.9* 11.3* 10.2* --  HCT 35.0* 31.4* 29.3* --  PLT -- 202 154 --  INR -- 1.93* 3.30 3.3  APTT -- -- -- --   A:  Vasculitis on chronic Anticoagulation.  Multiple myeloma. P:  Hold Coumadin as NPO Heparin gtt per pharmacy Call hematology in AM (Dr. Arbutus Ped)  INFECTIOUS  Lab 05/29/12  0150 05/29/12 0001 05/25/12 1350  WBC -- 2.0* 2.2*  PROCALCITON 0.13 -- --   Cultures: 7/12  Blood >>> Antibiotics: 7/12  Ceftriaxone >>> 7/12  Vancomycin >>>  A:  Suspected epiglottitis. P:   Abx / Cx as above  ENDOCRINE No results found for this basename: GLUCAP:5 in the last 168 hours A:  Risk of hyperglycemia while on systemic steroids. P:   SSI/CBG  NEUROLOGIC  A:  Chronic pain. P:   Hold Vicodin / MS Contin as NPO Morphine PRN  BEST PRACTICE / DISPOSITION Level of Care:  ICU Primary Service:  PCCM Consultants:  ENT / Hematology (Call in AM) Code Status:  Full Diet:  NPO DVT Px:  Not indicated (Coumadin / Heparin gtt) GI Px:  Not indicated Skin Integrity:  Abdominal wall healed ulcer Social / Family:  Updated at bedside  Lonia Farber, M.D. Pulmonary and Critical Care Medicine [facility] Pager: [phone]  05/29/2012, 2:37 AM

## 2012-05-29 NOTE — Progress Notes (Signed)
Set up patients home CPAP. RN stated she would call [employer] to check CPAP. Patient stated he would place himself on when ready.

## 2012-05-29 NOTE — ED Notes (Signed)
Per family:  Pt was sitting at home and started gasping for breath and they came straight here.  Pt was recently started on amitriptyline.

## 2012-05-29 NOTE — Progress Notes (Addendum)
[facility] [phone]   [phone]  OFFICE PROGRESS NOTE  [name], MD [address] [place] [address]  Principle Diagnosis: #1 recurrent multiple myeloma IgG kappa subtype diagnosed in June of 2008 #2 history of vasculitis and thrombosis of skin lesions   Prior Therapy: #1 status post palliative radiotherapy to the left hip under the care of Dr. Roselind Messier  #2 status post 5 cycles of systemic chemotherapy with Revlimid and low dose Decadron. Last dose given June 2009 with good response.  #3 status post autologous peripheral blood stem cell transplant at [facility] on 02/02/2008. #4 the patient had evidence for disease recurrence in December 2010.  #5 Revlimid 25 mg by mouth daily for 21 days every 4 weeks in addition to Decadron 40 mg orally on a weekly basis. The patient is status post 28 cycles, discontinued today secondary to disease progression.   Current therapy:  1. Zometa 4 mg IV given every 3 months  systemic chemotherapy with Velcade 1.3 mg/M2 on days 1, 4, 8 and 11 in addition to Doxil 30 mg/M2 on day 4 and Decadron 40 mg by mouth on weekly basis every 3 weeks. Status post 3 cycles   INTERVAL HISTORY: [name] 51 y.o. male returns to the clinic today for scheduled followup appointment accompanied by his wife. He recently had protein studies done to reevaluate his disease and is here to discuss the results of those studies. He also complains that his feet to the mid shin area bilaterally are painful and none. He is currently on Neurontin at 300 mg by mouth 3 times daily. He also has Vicodin for pain management as well however this combination is providing suboptimal results for him. He remains concerned about the lesions on his abdomen from the vasculitis. He has been referred to the wound care Center for further evaluation and management of these lesions.  Thus far he is tolerating his chemotherapy  with Vecade, Doxil and Decadron without difficulty with the exception of decreased energy.Marland KitchenHe is now on therapeutic dose anticoagulation due to his recurrent vasculitis related to his multiple myeloma.  His anticoagulation therapy is being managed by the [facility].He denied having any significant fever or chills. No night sweats. No nausea or vomiting.   MEDICAL HISTORY: Past Medical History  Diagnosis Date  . Multiple myeloma 09/24/2011  . Hypertension     ALLERGIES:  is allergic to red dye and other.  MEDICATIONS:  No current facility-administered medications for this visit.   Current Outpatient Prescriptions  Medication Sig Dispense Refill  . DISCONTD: potassium chloride SA (K-DUR,KLOR-CON) 20 MEQ tablet Take 1 tablet (20 mEq total) by mouth daily.  7 tablet  0   Facility-Administered Medications Ordered in Other Visits  Medication Dose Route Frequency Provider Last Rate Last Dose  . 0.9 %  sodium chloride infusion   Intravenous Continuous Lonia Farber, MD 100 mL/hr at 05/29/12 0525 100 mL/hr at 05/29/12 0525  . albuterol (PROVENTIL) (5 MG/ML) 0.5% nebulizer solution 5 mg  5 mg Nebulization Once Sunnie Nielsen, MD   5 mg at 05/29/12 0031  . antiseptic oral rinse (BIOTENE) solution 15 mL  15 mL Mouth Rinse q12n4p William S Minor, NP      . antiseptic oral rinse (BIOTENE) solution 15 mL  15 mL Mouth Rinse BID Vilinda Blanks Minor, NP   15 mL at 05/29/12 0845  . cefTRIAXone (ROCEPHIN) 2 g  in dextrose 5 % 50 mL IVPB  2 g Intravenous QHS Lonia Farber, MD   2 g at 05/29/12 0255  . dexamethasone (DECADRON) injection 10 mg  10 mg Intravenous Once Lonia Farber, MD   10 mg at 05/29/12 0246  . dexamethasone (DECADRON) injection 10 mg  10 mg Intravenous Q8H Melvenia Beam, MD   10 mg at 05/29/12 0524  . diphenhydrAMINE (BENADRYL) 50 MG/ML injection        25 mg at 05/29/12 0012  . diphenhydrAMINE (BENADRYL) injection 25 mg  25 mg  Intravenous Q4H Melvenia Beam, MD   25 mg at 05/29/12 0849  . EPINEPHrine (EPI-PEN) injection 0.3 mg  0.3 mg Intramuscular Once Sunnie Nielsen, MD   0.3 mg at 05/29/12 0013  . famotidine (PEPCID) 20-0.9 MG/50ML-% IVPB           . famotidine (PEPCID) 20-0.9 MG/50ML-% IVPB           . famotidine (PEPCID) IVPB 20 mg  20 mg Intravenous Q12H Melvenia Beam, MD      . heparin ADULT infusion 100 units/mL (25000 units/250 mL)  15 Units/kg/hr (Adjusted) Intravenous Continuous Sunnie Nielsen, MD 12 mL/hr at 05/29/12 0430 15 Units/kg/hr at 05/29/12 0430  . insulin aspart (novoLOG) injection 0-15 Units  0-15 Units Subcutaneous Q4H Lonia Farber, MD   2 Units at 05/29/12 0845  . methylPREDNISolone sodium succinate (SOLU-MEDROL) 125 mg/2 mL injection           . morphine 2 MG/ML injection 2 mg  2 mg Intravenous Once Sunnie Nielsen, MD   2 mg at 05/29/12 0026  . morphine 2 MG/ML injection 2-4 mg  2-4 mg Intravenous Q4H PRN Lonia Farber, MD      . ondansetron (ZOFRAN) injection 4 mg  4 mg Intravenous Once Sunnie Nielsen, MD   4 mg at 05/29/12 0026  . piperacillin-tazobactam (ZOSYN) IVPB 3.375 g  3.375 g Intravenous Once Sunnie Nielsen, MD   3.375 g at 05/29/12 0109  . potassium chloride 10 mEq in 100 mL IVPB  10 mEq Intravenous Q1 Hr x 4 Vilinda Blanks Minor, NP      . Racepinephrine HCl 2.25 % nebulizer solution 0.5 mL  0.5 mL Nebulization Once Sunnie Nielsen, MD   0.5 mL at 05/29/12 0031  . Racepinephrine HCl 2.25 % nebulizer solution 0.5 mL  0.5 mL Nebulization Q3H PRN Melvenia Beam, MD      . vancomycin (VANCOCIN) IVPB 1000 mg/200 mL premix  1,000 mg Intravenous Once Sunnie Nielsen, MD   1,000 mg at 05/29/12 0133  . vancomycin (VANCOCIN) IVPB 1000 mg/200 mL premix  1,000 mg Intravenous Q8H Sunnie Nielsen, MD   1,000 mg at 05/29/12 0909  . DISCONTD: dexamethasone (DECADRON) injection 4 mg  4 mg Intravenous Q6H Lonia Farber, MD      . DISCONTD: diphenhydrAMINE (BENADRYL) 50 MG/ML injection           .  DISCONTD: famotidine (PEPCID) IVPB 20 mg  20 mg Intravenous Q12H Melvenia Beam, MD      . DISCONTD: famotidine (PEPCID) IVPB 20 mg  20 mg Intravenous Q12H Melvenia Beam, MD      . DISCONTD: methylPREDNISolone sodium succinate (SOLU-MEDROL) 125 mg/2 mL injection             REVIEW OF SYSTEMS:  A comprehensive review of systems was negative except for: Constitutional: positive for fatigue Integument/breast: positive for skin lesion(s)   PHYSICAL EXAMINATION: General appearance: alert, cooperative and no distress Head: Normocephalic, without obvious  abnormality, atraumatic Neck: no adenopathy Lymph nodes: Cervical, supraclavicular, and axillary nodes normal. Resp: clear to auscultation bilaterally Cardio: regular rate and rhythm, S1, S2 normal, no murmur, click, rub or gallop GI: Abdomen generally soft however there is asymmetry to the right side of the abdomen with mild to moderate soft tissue swelling underlying the area of vasculitis. The vasculitis is overlying the previous area of vasculitis that the patient had. There is no evidence for super infection. The areas are less tender to palpation. Extremities: extremities normal, atraumatic, no cyanosis or edema Neurologic: Alert and oriented X 3, normal strength and tone. Normal symmetric reflexes. Normal coordination and gait Skin: Examination abdomen reveals vasculitic inflammation over the previous area of vasculitis on the right side of the abdomen. There is more eschar in place no active bleeding or bruising. These areas are smaller. No purulent drainage or evidence of superinfection. There is surrounding hyperpigmentation There is continued decrease in the previously increased girth/soft tissue swelling on the low right side of the abdomen, this area is now softer on exam and and totally nontender.  ECOG PERFORMANCE STATUS: 1 - Symptomatic but completely ambulatory  Blood pressure 114/76, pulse 106, temperature 97.3 F (36.3 C),  temperature source Oral, height 5\' 4"  (1.626 m), weight 236 lb (107.049 kg).  LABORATORY DATA: Lab Results  Component Value Date   WBC 1.0* 05/29/2012   HGB 10.0* 05/29/2012   HCT 29.1* 05/29/2012   MCV 90.1 05/29/2012   PLT 170 05/29/2012      Chemistry      Component Value Date/Time   NA 127* 05/29/2012 0500   K 3.1* 05/29/2012 0500   CL 93* 05/29/2012 0500   CO2 21 05/29/2012 0500   BUN 12 05/29/2012 0500   CREATININE 0.65 05/29/2012 0500      Component Value Date/Time   CALCIUM 8.8 05/29/2012 0500   ALKPHOS 27* 05/29/2012 0001   AST 31 05/29/2012 0001   ALT 18 05/29/2012 0001   BILITOT 0.7 05/29/2012 0001     Other results: Beta-2 microglobulin 5.81, free kappa light chain 32.8, free lambda light chain 0..03, kappa/lambda ratio *, IgG 4050, IgA <6 and IgM <4  RADIOGRAPHIC STUDIES: No results found.  ASSESSMENT/PLAN: This is a very pleasant 52 years old [place] American male with recurrent multiple myeloma most recently treated with Revlimid and Decadron status post 28 cycles. Unfortunately the patient has evidence for disease progression based on the last myeloma panel. The patient is now receiving systemic chemotherapy in the form of subcutaneous Velcade given on days 1,4,8 and 11 as well as Doxil given IV on day 4 and Decadron taken by mouth on a weekly basis. The patient was discussed with  Dr. Arbutus Ped, who also reviewed the protein panel and discussed with the patient and his wife. Overall he's had a mixed response to therapy. His ANC is subtherapeutic at 1.0 today and we will hold his chemotherapy this week and resume next week as long as his counts are in a therapeutic range. We'll continue the current chemotherapy with Velcade and Doxil with Decadron for another 3 cycles and check another myeloma panel/protein studies to reevaluate his disease. Regarding his symptomatic peripheral neuropathy we will add amitriptyline 50 mg by mouth at bedtime to his current Neurontin 300 mg by mouth 3  times daily. He may continue to take Vicodin as needed for pain as well. He will followup in 3 weeks with repeat CBC differential C. met and LDH. He will continue to followup with  the [facility] Coumadin clinic regarding his full dose anticoagulation therapy. fWe will continue to monitor the lesions on his abdomen closely and will refer him to a dermatologist or vascular specialist if needed. We may consider referring the patient back to [facility]. Vicente Serene at [facility] for possible re transplant.  [name], [name] Tristan [name], [name]   All questions were answered. The patient knows to call the clinic with any problems, questions or concerns. We can certainly see the patient much sooner if necessary.  I spent 20 minutes counseling the patient face to face. The total time spent in the appointment was 30 minutes.

## 2012-05-29 NOTE — ED Provider Notes (Signed)
History     CSN: [id number]  Arrival date & time 05/28/12  2344   First MD Initiated Contact with Patient 05/29/12 0004      Subjective chief complaint is sore throat "feels like my throat is closing"  (Consider location/radiation/quality/duration/timing/severity/associated sxs/prior treatment) HPI History provided by patient and his wife bedside. Currently being treated for multiple myeloma with chemotherapy which she did not get last week due to low white blood cell count. Last chemotherapy 2 weeks ago. Sore throat started 2 days ago and tonight significantly worse with feels like throat is closing and having difficulty breathing. No history of allergic reaction or similar symptoms. He denies any itching or rash. Is taking a new medication per his primary care physician but unable to recall what it is. No fevers or chills. No known sick contacts. No recent illness otherwise. Complains of pain with swallowing, sharp in quality and not radiating. Symptoms severe. Has significant associated anxiety with difficulty breathing.   Past Medical History  Diagnosis Date  . Multiple myeloma 09/24/2011  . Hypertension     History reviewed. No pertinent past surgical history.  No family history on file.  History  Substance Use Topics  . Smoking status: Never Smoker   . Smokeless tobacco: Not on file  . Alcohol Use:       Review of Systems  Constitutional: Negative for fever and chills.  HENT: Positive for sore throat. Negative for neck pain and neck stiffness.   Eyes: Negative for pain.  Respiratory: Positive for shortness of breath and stridor.   Cardiovascular: Negative for chest pain.  Gastrointestinal: Negative for abdominal pain.  Genitourinary: Negative for dysuria.  Musculoskeletal: Negative for back pain.  Skin: Negative for rash.  Neurological: Negative for headaches.  All other systems reviewed and are negative.    Allergies  Red dye and Other  Home Medications    Current Outpatient Rx  Name Route Sig Dispense Refill  . AMITRIPTYLINE HCL 50 MG PO TABS Oral Take 1 tablet (50 mg total) by mouth at bedtime. 30 tablet 1  . BENZONATATE 100 MG PO CAPS Oral Take 100 mg by mouth 3 (three) times daily as needed. For cough    . DEXAMETHASONE 4 MG PO TABS Oral Take 1 tablet (4 mg total) by mouth as directed. 10 tabs every week with chemo. 40 tablet 4  . GABAPENTIN 300 MG PO CAPS Oral Take 1 capsule (300 mg total) by mouth 3 (three) times daily. 60 capsule 1  . HYDROCODONE-ACETAMINOPHEN 5-500 MG PO TABS Oral Take 1 tablet by mouth every 6 (six) hours as needed. 30 tablet 0  . LISINOPRIL-HYDROCHLOROTHIAZIDE 20-12.5 MG PO TABS Oral Take 1 tablet by mouth daily.      . MORPHINE SULFATE ER 15 MG PO TBCR Oral Take 1 tablet (15 mg total) by mouth 2 (two) times daily. 60 tablet 0  . OMEPRAZOLE 40 MG PO CPDR Oral Take 1 capsule (40 mg total) by mouth daily. 30 capsule 3  . POTASSIUM CHLORIDE CRYS ER 20 MEQ PO TBCR Oral Take 20 mEq by mouth daily as needed. Patients wife states that he only takes potassium when he is told that his potassium levels are low.    . WARFARIN SODIUM 5 MG PO TABS Oral Take 7.5 mg by mouth daily.      BP 125/55  Pulse 123  Temp 99.5 F (37.5 C) (Oral)  Resp 21  SpO2 100%  Physical Exam  Constitutional: He is oriented to person,  place, and time. He appears well-developed and well-nourished.  HENT:  Head: Normocephalic and atraumatic.       Uvula midline without evidence of airway edema, no tongue or lip edema. No angioedema. No submental fullness or tenderness or increased warmth to touch.  Eyes: Conjunctivae and EOM are normal. Pupils are equal, round, and reactive to light.  Neck: Neck supple.  Cardiovascular: Normal rate, regular rhythm, S1 normal, S2 normal and normal pulses.     No systolic murmur is present   No diastolic murmur is present  Pulses:      Radial pulses are 2+ on the right side, and 2+ on the left side.   Pulmonary/Chest: Effort normal and breath sounds normal. Stridor present. He has no wheezes. He has no rhonchi. He has no rales. He exhibits no tenderness.  Abdominal: Soft. Normal appearance and bowel sounds are normal. There is no tenderness. There is no CVA tenderness and negative Murphy's sign.  Musculoskeletal:       BLE:s Calves nontender, no cords or erythema, negative Homans sign  Lymphadenopathy:    He has no cervical adenopathy.  Neurological: He is alert and oriented to person, place, and time. He has normal strength. No cranial nerve deficit or sensory deficit. GCS eye subscore is 4. GCS verbal subscore is 5. GCS motor subscore is 6.  Skin: Skin is warm and dry. No rash noted. He is not diaphoretic.  Psychiatric: His speech is normal.       Cooperative and appropriate    ED Course  Procedures (including critical care time)  Labs Reviewed  CBC - Abnormal; Notable for the following:    WBC 2.0 (*)     RBC 3.50 (*)     Hemoglobin 11.3 (*)     HCT 31.4 (*)     RDW 15.8 (*)     All other components within normal limits  COMPREHENSIVE METABOLIC PANEL - Abnormal; Notable for the following:    Sodium 127 (*)     Chloride 93 (*)     Glucose, Bld 102 (*)     Total Protein 10.2 (*)     Albumin 3.4 (*)     Alkaline Phosphatase 27 (*)     All other components within normal limits  BLOOD GAS, ARTERIAL - Abnormal; Notable for the following:    pH, Arterial 7.458 (*)     pCO2 arterial 32.6 (*)     pO2, Arterial 103.0 (*)     All other components within normal limits  PROTIME-INR - Abnormal; Notable for the following:    Prothrombin Time 22.4 (*)     INR 1.93 (*)     All other components within normal limits  POCT I-STAT, CHEM 8 - Abnormal; Notable for the following:    Sodium 133 (*)     Glucose, Bld 109 (*)     Calcium, Ion 1.11 (*)     Hemoglobin 11.9 (*)     HCT 35.0 (*)     All other components within normal limits  LACTIC ACID, PLASMA  PROCALCITONIN   Dg Neck Soft  Tissue  05/29/2012  *RADIOLOGY REPORT*  Clinical Data: Sore throat and stridor.  NECK SOFT TISSUES - 1+ VIEW  Comparison: Cervical spine radiographs performed 12/02/2011  Findings: Prominence of the epiglottis is thought to reflect patient rotation and the patient's underlying habitus.  The aryepiglottic folds are not well characterized.  The hypopharynx is slightly prominent, but this may be transient in nature.  The  nasopharynx and oropharynx are unremarkable in appearance.  The proximal trachea is unremarkable.  Prevertebral soft tissues are within normal limits.  No radiopaque foreign bodies are seen.  No acute osseous abnormalities are identified.  The visualized paranasal sinuses and mastoid air cells are well-aerated.  Mild vascular congestion is noted at the visualized portions of the lungs.  IMPRESSION: Prominence of the epiglottis is thought to reflect patient rotation and the patient's habitus; grossly unremarkable radiographs of the soft tissues of the neck.  Original Report Authenticated By: Tonia Ghent, M.D.   Dg Chest Portable 1 View  05/29/2012  *RADIOLOGY REPORT*  Clinical Data: Sudden onset of shortness of breath.  PORTABLE CHEST - 1 VIEW  Comparison: Chest radiograph performed 10/30/2009  Findings: The lungs are well-aerated and clear.  There is no evidence of focal opacification, pleural effusion or pneumothorax.  The cardiomediastinal silhouette is within normal limits.  No acute osseous abnormalities are seen.  IMPRESSION: No acute cardiopulmonary process seen.  Original Report Authenticated By: Tonia Ghent, M.D.   1:55 AM d/w PCCM and will eval in ED requesting ENT to eval.   1:56 AM d/w ENT request ENT scope and afrin bedside. DR Emeline Darling scoped PT bedside. PCCM admit.   CRITICAL CARE Performed by: Sunnie Nielsen   Total critical care time: 65  Critical care time was exclusive of separately billable procedures and treating other patients.  Critical care was necessary to treat  or prevent imminent or life-threatening deterioration.  Critical care was time spent personally by me on the following activities: development of treatment plan with patient and/or surrogate as well as nursing, discussions with consultants, evaluation of patient's response to treatment, examination of patient, obtaining history from patient or surrogate, ordering and performing treatments and interventions, ordering and review of laboratory studies, ordering and review of radiographic studies, pulse oximetry and re-evaluation of patient's condition. Patient presenting with respiratory distress in extremis, given epinephrine, IV Solu-Medrol, Benadryl, Pepcid for possible allergic reaction/ anaphylaxis. No change with medications at which point stridor became more evident and given racemic epinephrine. Labs and imaging obtained and case discussed with your nose and throat Dr. Emeline Darling, present at bedside for a fiberoptic evaluation. Symptomatically improving after racemic epi, stridor resolved and pulmonary critical care consult for admission. Serial evaluations with persistent tachycardia. IV fluids. Pain control IV morphine. IV ABx for neutropenia and sore throat. Imaging obtained and reviewed as above. Imaging d/w radiologist possible epiglottitis.   MDM   Sore throat, stridor and respiratory distress. Old records reviewed. Treatment as above. ENT evaluation and critical care admit to ICU. Improving in ED. No tonsillar abnormality or exudates. Labs and imaging reviewed as above.        Sunnie Nielsen, MD 05/29/12 [phone]

## 2012-05-29 NOTE — Progress Notes (Addendum)
Name: [name] MRN: [id number] DOB: 1961/09/24    LOS: 1  Referring Provider:  EDP Reason for Referral:  Epiglottitis  PULMONARY / CRITICAL CARE MEDICINE  HPI:  51 yo with multiple myeloma and vasculitis on Coumadin brought to [facility] ED after having difficulty breathing last night.  Apparently had a sore throat for several days which got progressively worth.  Also reports subjective fever, but did not check temperature.  Unable to swallow for several days.  No wheezing, phlegm production or chest pain.  Supine position aggravates dyspnea.  There are no alleviating factors.   Brief patient description:  51 yo with multiple myeloma and vasculitis on Coumadin brought to [facility] ED after having difficulty breathing last night.  Apparently had a sore throat for several days which got progressively worth.  Also reports subjective fever, but did not check temperature.  Unable to swallow for several days.  No wheezing, phlegm production or chest pain.  Supine position aggravates dyspnea.  There are no alleviating factors.  Events Since Admission: 7/12  Admitted with sore throat and difficulty breathing  Current Status: NAD Vital Signs: Temp:  [99.5 F (37.5 C)] 99.5 F (37.5 C) (07/11 2346) Pulse Rate:  [110-123] 110  (07/12 0425) Resp:  [18-21] 19  (07/12 0425) BP: (125-140)/(55-86) 140/86 mmHg (07/12 0425) SpO2:  [100 %] 100 % (07/12 0425) Weight:  [233 lb 11 oz (106 kg)-235 lb (106.595 kg)] 233 lb 11 oz (106 kg) (07/12 0425)  Physical Examination: General:  No acute distress, , hoarse voice Neuro:  Awake, alert, cooperative HEENT:  Crowded oropharynx, cannot appreciate exudate or erythema. Tongue not blocking airway, able to swallow Neck:  No lymphadenopathy / no tenderness to palpation Cardiovascular:  RRR Lungs:  CTAB Abdomen:  Obese, nontender, bowel sounds present Musculoskeletal:  Moves all extremities, no edema Skin:  Anterior abdominal wall ulcer covered with eschar   Active  Problems:  Multiple myeloma  Vasculitis  Epiglottitis  Chronic anticoagulation  Chronic pain  Hypertension  ASSESSMENT AND PLAN  PULMONARY  Lab 05/29/12 0010  PHART 7.458*  PCO2ART 32.6*  PO2ART 103.0*  HCO3 22.7  O2SAT 97.5   Ventilator Settings:   CXR:  7/12 >>> nad Neck XR:  7/12 >>> Prominent epiglottis  ETT:  NA  A:  No evidence of pulmonary disease.  Suspected epiglottitis / pharyngitis. Doubt allergic reaction / angioedema (on Lisinopril preadmission). No evidence of airway significant airway compromise at this time.  OSA  P:   Admit to ICU ENT evaluation Decadron 10 mg q8h Pepcid / Benadryl (per ENT) ABx per ID section May need soft tissue neck CT when able tolerate supine position Hold Lisinopril cpap as tolerated CARDIOVASCULAR  Lab 05/29/12 0150  TROPONINI --  LATICACIDVEN 0.8  PROBNP --   ECG:  NA Lines: NA  A: History of hypertension.  Hemodynamically stable.  No evidence of arrhythmia / ischemia.  History of hypertension. P:  May consider using regimen other then ACEI  RENAL  Lab 05/29/12 0500 05/29/12 0011 05/29/12 0001  NA 127* 133* 127*  K 3.1* 3.8 --  CL 93* 99 93*  CO2 21 -- 24  BUN 12 18 14   CREATININE 0.65 0.80 0.71  CALCIUM 8.8 -- 9.2  MG -- -- --  PHOS -- -- --   Intake/Output      07/11 0701 - 07/12 0700 07/12 0701 - 07/13 0700   I.V. (mL/kg) 536 (5.1)    Total Intake(mL/kg) 536 (5.1)    Urine (  mL/kg/hr)  400   Total Output  400   Net +536 -400         Foley:  NA  A:  Normal renal function. P:   BMP in AM Maintenance IVF NS 100 mL/h Replete k GASTROINTESTINAL  Lab 05/29/12 0001  AST 31  ALT 18  ALKPHOS 27*  BILITOT 0.7  PROT 10.2*  ALBUMIN 3.4*   A:  No active issues. P:   NPO except for ice chips  HEMATOLOGIC  Lab 05/29/12 0510 05/29/12 0011 05/29/12 0001 05/25/12 1350 05/25/12  HGB 10.0* 11.9* 11.3* 10.2* --  HCT 29.1* 35.0* 31.4* 29.3* --  PLT 170 -- 202 154 --  INR -- -- 1.93* 3.30 3.3    APTT -- -- -- -- --   A:  Vasculitis on chronic Anticoagulation.  Multiple myeloma. P:  Hold Coumadin as NPO Heparin gtt per pharmacy Call hematology in AM (Dr. Arbutus Ped) called per sm 7/12 0915  INFECTIOUS  Lab 05/29/12 0510 05/29/12 0150 05/29/12 0001 05/25/12 1350  WBC 1.0* -- 2.0* 2.2*  PROCALCITON -- 0.13 -- --   Cultures: 7/12  Blood >>> Antibiotics: 7/12  Ceftriaxone >>> 7/12  Vancomycin >>>  A:  Suspected epiglottitis. P:   Abx / Cx as above  ENDOCRINE  Lab 05/29/12 0759  GLUCAP 125*   A:  Risk of hyperglycemia while on systemic steroids. P:   SSI/CBG  NEUROLOGIC  A:  Chronic pain. P:   Hold Vicodin / MS Contin as NPO Morphine PRN  BEST PRACTICE / DISPOSITION Level of Care:  ICU Primary Service:  PCCM Consultants:  ENT / Hematology called [facility] 7/12 left message Code Status:  Full Diet:  NPO DVT Px:  Not indicated (Coumadin / Heparin gtt) GI Px:  Not indicated Skin Integrity:  Abdominal wall healed ulcer Social / Family:  Updated at bedside  [facility] ACNP Adolph Pollack PCCM Pager [phone] till 3 pm If no answer page [phone] 05/29/2012, 9:12 AM   [name], MD ; [facility] service [place] [phone].  After 5:30 PM or weekends, call [phone]

## 2012-05-29 NOTE — Progress Notes (Signed)
ANTIBIOTIC CONSULT NOTE - INITIAL  Pharmacy Consult for vancomycin Indication: Epiglotitis   Allergies  Allergen Reactions  . Red Dye Anaphylaxis    Lips swollen  3-4 times their baseline size  . Other Other (See Comments)    Strawberries "anything containing red dye"    Patient Measurements: Height: 5\' 5"  (165.1 cm) Weight: 235 lb (106.595 kg) IBW/kg (Calculated) : 61.5  Adjusted Body Weight:   Vital Signs: Temp: 99.5 F (37.5 C) (07/11 2346) Temp src: Oral (07/11 2346) BP: 125/55 mmHg (07/12 0101) Pulse Rate: 123  (07/12 0101) Intake/Output from previous day:   Intake/Output from this shift:    Labs:  Basename 05/29/12 0011 05/29/12 0001  WBC -- 2.0*  HGB 11.9* 11.3*  PLT -- 202  LABCREA -- --  CREATININE 0.80 0.71   Estimated Creatinine Clearance: 125.6 ml/min (by C-G formula based on Cr of 0.8). No results found for this basename: VANCOTROUGH:2,VANCOPEAK:2,VANCORANDOM:2,GENTTROUGH:2,GENTPEAK:2,GENTRANDOM:2,TOBRATROUGH:2,TOBRAPEAK:2,TOBRARND:2,AMIKACINPEAK:2,AMIKACINTROU:2,AMIKACIN:2, in the last 72 hours   Microbiology: No results found for this or any previous visit (from the past 720 hour(s)).  Medical History: Past Medical History  Diagnosis Date  . Multiple myeloma 09/24/2011  . Hypertension     Medications:  Anti-infectives     Start     Dose/Rate Route Frequency Ordered Stop   05/29/12 1000   vancomycin (VANCOCIN) IVPB 1000 mg/200 mL premix        1,000 mg 200 mL/hr over 60 Minutes Intravenous Every 8 hours 05/29/12 0311     05/29/12 0245   cefTRIAXone (ROCEPHIN) 2 g in dextrose 5 % 50 mL IVPB        2 g 100 mL/hr over 30 Minutes Intravenous Daily at bedtime 05/29/12 0236     05/29/12 0100   vancomycin (VANCOCIN) IVPB 1000 mg/200 mL premix        1,000 mg 200 mL/hr over 60 Minutes Intravenous  Once 05/29/12 0047 05/29/12 0233   05/29/12 0100   piperacillin-tazobactam (ZOSYN) IVPB 3.375 g        3.375 g 100 mL/hr over 30 Minutes  Intravenous  Once 05/29/12 0047 05/29/12 0139         Assessment: Patient with Epiglotitis.  First dose of antibiotics already given in ED.  Goal of Therapy:  Vancomycin trough level 15-20 mcg/ml  Plan:  Measure antibiotic drug levels at steady state Follow up culture results Vancomycin 1gm iv q8hr  Darlina Guys, Jacquenette Shone Crowford 05/29/2012,3:12 AM

## 2012-05-29 NOTE — ED Notes (Signed)
Resp in with pt obtaining ABG

## 2012-05-29 NOTE — Progress Notes (Signed)
ANTICOAGULATION CONSULT NOTE - Follow Up Consult  Pharmacy Consult for Comadin Indication: Vasculitis  Allergies  Allergen Reactions  . Red Dye Anaphylaxis    Lips swollen  3-4 times their baseline size  . Other Other (See Comments)    Strawberries "anything containing red dye"    Patient Measurements: Height: 5\' 5"  (165.1 cm) Weight: 233 lb 11 oz (106 kg) IBW/kg (Calculated) : 61.5  Heparin Dosing Weight: 80kg  Vital Signs: BP: 124/72 mmHg (07/12 1000) Pulse Rate: 97  (07/12 1000)  Labs:  Basename 05/29/12 1100 05/29/12 0510 05/29/12 0500 05/29/12 0011 05/29/12 0001  HGB -- 10.0* -- 11.9* --  HCT -- 29.1* -- 35.0* 31.4*  PLT -- 170 -- -- 202  APTT -- -- -- -- --  LABPROT 25.3* -- -- -- 22.4*  INR 2.26* -- -- -- 1.93*  HEPARINUNFRC 0.41 -- -- -- --  CREATININE -- -- 0.65 0.80 0.71  CKTOTAL -- -- -- -- --  CKMB -- -- -- -- --  TROPONINI -- -- -- -- --    Estimated Creatinine Clearance: 125.3 ml/min (by C-G formula based on Cr of 0.65).   Medications:  Scheduled:    . albuterol  5 mg Nebulization Once  . antiseptic oral rinse  15 mL Mouth Rinse q12n4p  . antiseptic oral rinse  15 mL Mouth Rinse BID  . cefTRIAXone (ROCEPHIN)  IV  2 g Intravenous QHS  . dexamethasone  10 mg Intravenous Once  . dexamethasone  10 mg Intravenous Q8H  . diphenhydrAMINE      . diphenhydrAMINE  25 mg Intravenous Q4H  . EPINEPHrine  0.3 mg Intramuscular Once  . famotidine      . famotidine      . famotidine (PEPCID) IV  20 mg Intravenous Q12H  . insulin aspart  0-15 Units Subcutaneous Q4H  . methylPREDNISolone sodium succinate      .  morphine injection  2 mg Intravenous Once  . ondansetron (ZOFRAN) IV  4 mg Intravenous Once  . piperacillin-tazobactam (ZOSYN)  IV  3.375 g Intravenous Once  . potassium chloride  10 mEq Intravenous Q1 Hr x 4  . Racepinephrine HCl  0.5 mL Nebulization Once  . vancomycin  1,000 mg Intravenous Once  . vancomycin  1,000 mg Intravenous Q8H  . DISCONTD:  dexamethasone  4 mg Intravenous Q6H  . DISCONTD: famotidine (PEPCID) IV  20 mg Intravenous Q12H  . DISCONTD: famotidine (PEPCID) IV  20 mg Intravenous Q12H   Infusions:    . sodium chloride 100 mL/hr (05/29/12 0525)  . heparin 15 Units/kg/hr (05/29/12 0430)    Assessment:  50 YOM with multiple myeloma on chronic coumadin for vasculitis.  Coumadin held yesterday due to NPO status, IV heparin started overnight.  INR is therapeutic today (2.26), was 1.93 overnight  Spoke with CCM, ok to stop heparin, resume diet and coumadin.  Goal of Therapy:  INR = 2-3 Monitor platelets by anticoagulation protocol: Yes   Plan:   D/C heparin  Coumadin 5mg  IV today (plan po tomorrow if tolerates diet)  Daily PT/INR  Loralee Pacas, PharmD, BCPS Pager: [phone] 05/29/2012,11:48 AM

## 2012-05-29 NOTE — Consult Note (Addendum)
[name] [name] [id number] [birth year] [name] Nielsen, MD  Reason for Consult: supraglottitis, dyspnea  HPI: 51yo with Multiple myeloma who woke up with shortness of breath tonight. Came to ER, some concern for airway edema, ENT consulted for laryngoscopy.  Allergies:  Allergies  Allergen Reactions  . Red Dye Anaphylaxis    Lips swollen  3-4 times their baseline size  . Other Other (See Comments)    Strawberries "anything containing red dye"    ROS: fatigue, dyspnea, otherwise negative x 10 systems except per HPI PMH:  Past Medical History  Diagnosis Date  . Multiple myeloma 09/24/2011  . Hypertension     FH: No family history on file.  SH:  History   Social History  . Marital Status: Married    Spouse Name: N/A    Number of Children: N/A  . Years of Education: N/A   Occupational History  . Not on file.   Social History Main Topics  . Smoking status: Never Smoker   . Smokeless tobacco: Not on file  . Alcohol Use:   . Drug Use:   . Sexually Active:    Other Topics Concern  . Not on file   Social History Narrative  . No narrative on file    PSH: History reviewed. No pertinent past surgical history.  Physical  Exam: CN 2-12 grossly intact and symmetric. Somewhat strained, muffled voice. EAC/TMs normal BL. Oral cavity and  ororpharynx shows some mild/moderate edema and erythema with some pooling of oral secretions. Skin warm and dry. External nose and ears without masses or lesions. EOMI, PERRLA. Neck supple with no masses or lesions.  Procedure Note: 31575 flexible laryngoscopy. Informed verbal consent was obtained after explaining the risks (including bleeding and infection), benefits and alternatives of the procedure. Verbal timeout was performed prior to the procedure. The nose was topicalized with topical lidocaine/oxymetazoline. The 4mm flexible  scope was advanced through the right nasal cavity. The septum shows a large anterior perforation with some crusting.  The turbinates appeared grossly normal. The middle meatus was free of polyps of purulence but there are some thick physiologic secretions bilaterally. The eustachian tube, choana, and adenoids were normal in appearance. The epiglottis appears normal with no edema, but the false vocal folds are erythematous and edematous consistent with supraglottitis. The true vocal folds inferior to the edematous false vocal folds show only mild mucosal edema with a patent glottic airway. The subglottis is normal and patent. The true vocal folds show normal adduction and abduction.  A/P: false vocal fold edema and supraglottitis. Would admit to critical care and treat aggressively with usual medications for angioedema/supraglottitis picture: high dose IV decadron tapering to PO prednisone taper when improving clinically, H1 and H2 blockers, racemic epinephrine and humidified oxygen and monitor closely for clinical improvement. Can notify anesthesia to intubate if deteriorates clinically but appears stable for now.  Would also treat with broad spectrum antibiotics to cover oral flora.   [name] [name] 05/29/2012 2:49 AM

## 2012-05-29 NOTE — Progress Notes (Signed)
Faxed cancer policy to [employer] @ [phone].

## 2012-05-29 NOTE — Progress Notes (Signed)
ANTICOAGULATION CONSULT NOTE - Initial Consult  Pharmacy Consult for heparin Indication: Vasculitis   Allergies  Allergen Reactions  . Red Dye Anaphylaxis    Lips swollen  3-4 times their baseline size  . Other Other (See Comments)    Strawberries "anything containing red dye"    Patient Measurements: Height: 5\' 5"  (165.1 cm) Weight: 235 lb (106.595 kg) IBW/kg (Calculated) : 61.5  Heparin Dosing Weight:   Vital Signs: Temp: 99.5 F (37.5 C) (07/11 2346) Temp src: Oral (07/11 2346) BP: 125/55 mmHg (07/12 0101) Pulse Rate: 123  (07/12 0101)  Labs:  Basename 05/29/12 0011 05/29/12 0001  HGB 11.9* 11.3*  HCT 35.0* 31.4*  PLT -- 202  APTT -- --  LABPROT -- 22.4*  INR -- 1.93*  HEPARINUNFRC -- --  CREATININE 0.80 0.71  CKTOTAL -- --  CKMB -- --  TROPONINI -- --    Estimated Creatinine Clearance: 125.6 ml/min (by C-G formula based on Cr of 0.8).   Medical History: Past Medical History  Diagnosis Date  . Multiple myeloma 09/24/2011  . Hypertension     Medications:  Infusions:    . sodium chloride 100 mL/hr at 05/29/12 0247  . heparin      Assessment: Patient with chronic warfarin therapy for vasculitis being monitored by [facility] coumadin clinic.  INR <2, MD wishes pharmacy to dose heparin as bridge.  Goal of Therapy:  Heparin level 0.3-0.7 units/ml Monitor platelets by anticoagulation protocol: Yes   Plan:  Heparin drip at 1200 units/hr Daily CBC/heparin level. Heparin level at [address] [place] 05/29/2012,3:08 AM

## 2012-05-30 DIAGNOSIS — J0431 Supraglottitis, unspecified, with obstruction: Principal | ICD-10-CM

## 2012-05-30 LAB — GLUCOSE, CAPILLARY: Glucose-Capillary: 121 mg/dL — ABNORMAL HIGH (ref 70–99)

## 2012-05-30 LAB — BASIC METABOLIC PANEL
BUN: 15 mg/dL (ref 6–23)
CO2: 21 mEq/L (ref 19–32)
Chloride: 100 mEq/L (ref 96–112)
GFR calc Af Amer: >90 mL/min (ref >90)
GFR calc non Af Amer: >90 mL/min (ref >90)
Glucose, Bld: 114 mg/dL — ABNORMAL HIGH (ref 70–99)
Potassium: 3.9 mEq/L (ref 3.5–5.1)

## 2012-05-30 LAB — CBC
HCT: 25 % — ABNORMAL LOW (ref 39.0–52.0)
Hemoglobin: 9 g/dL — ABNORMAL LOW (ref 13.0–17.0)
MCH: 32.5 pg (ref 26.0–34.0)
MCV: 90.3 fL (ref 78.0–100.0)
Platelets: 165 10*3/uL (ref 150–400)
RBC: 2.77 MIL/uL — ABNORMAL LOW (ref 4.22–5.81)

## 2012-05-30 MED ORDER — WARFARIN SODIUM 5 MG IV SOLR
2.5000 mg | Freq: Once | INTRAVENOUS | Status: DC
  Filled 2012-05-30: qty 1.25

## 2012-05-30 MED ORDER — AMOXICILLIN-POT CLAVULANATE 875-125 MG PO TABS: 1.0000 | ORAL_TABLET | Freq: Two times a day (BID) | ORAL | Status: DC

## 2012-05-30 MED ORDER — IRBESARTAN-HYDROCHLOROTHIAZIDE 150-12.5 MG PO TABS: 1.0000 | ORAL_TABLET | Freq: Every day | ORAL | Status: DC

## 2012-05-30 MED ORDER — DEXAMETHASONE 4 MG PO TABS: 4.0000 mg | ORAL_TABLET | Freq: Every day | ORAL | Status: AC

## 2012-05-30 NOTE — Progress Notes (Signed)
Pt seen, asleep, no distress noted.  Pt is currently wearing his nasal cpap from home and is tolerating well at this time.  Hr 83, sats 99%, rr22, bp 148/87. [employer] was contacted by ICU secretary earlier in shift and will inspect machine tomorrow.  Cpap plug intact, no defects or frays noted.

## 2012-05-30 NOTE — Progress Notes (Signed)
Pt is discharged per Dr. Sung Amabile. Perscriptions given to patient and paperwork given to patient.

## 2012-05-30 NOTE — Progress Notes (Signed)
ANTICOAGULATION CONSULT NOTE - Follow Up Consult  Pharmacy Consult for Comadin Indication: Vasculitis  Allergies  Allergen Reactions  . Red Dye Anaphylaxis    Lips swollen  3-4 times their baseline size  . Other Other (See Comments)    Strawberries "anything containing red dye"    Patient Measurements: Height: 5\' 5"  (165.1 cm) Weight: 232 lb 12.9 oz (105.6 kg) IBW/kg (Calculated) : 61.5  Heparin Dosing Weight: 80kg  Vital Signs: Temp: 98.5 F (36.9 C) (07/13 0800) Temp src: Oral (07/13 0800) BP: 146/90 mmHg (07/13 0800) Pulse Rate: 79  (07/13 0800)  Labs:  Basename 05/30/12 0348 05/29/12 1100 05/29/12 0510 05/29/12 0500 05/29/12 0011 05/29/12 0001  HGB 9.0* -- 10.0* -- -- --  HCT 25.0* -- 29.1* -- 35.0* --  PLT 165 -- 170 -- -- 202  APTT 46* -- -- -- -- --  LABPROT 28.9* 25.3* -- -- -- 22.4*  INR 2.67* 2.26* -- -- -- 1.93*  HEPARINUNFRC -- 0.41 -- -- -- --  CREATININE 0.56 -- -- 0.65 0.80 --  CKTOTAL -- -- -- -- -- --  CKMB -- -- -- -- -- --  TROPONINI -- -- -- -- -- --    Estimated Creatinine Clearance: 125 ml/min (by C-G formula based on Cr of 0.56).   Medications:  Scheduled:     . antiseptic oral rinse  15 mL Mouth Rinse q12n4p  . antiseptic oral rinse  15 mL Mouth Rinse BID  . cefTRIAXone (ROCEPHIN)  IV  2 g Intravenous QHS  . dexamethasone  10 mg Intravenous Q8H  . diphenhydrAMINE  25 mg Intravenous Q6H  . famotidine (PEPCID) IV  20 mg Intravenous Q12H  . insulin aspart  0-20 Units Subcutaneous TID WC  . insulin aspart  0-5 Units Subcutaneous QHS  . potassium chloride  10 mEq Intravenous Q1 Hr x 4  . warfarin  5 mg Intravenous ONCE-1800  . Warfarin - Pharmacist Dosing Inpatient   Does not apply q1800  . DISCONTD: diphenhydrAMINE  25 mg Intravenous Q4H  . DISCONTD: insulin aspart  0-15 Units Subcutaneous Q4H  . DISCONTD: vancomycin  1,000 mg Intravenous Q8H   Infusions:     . 0.9 % NaCl with KCl 40 mEq / L 50 mL/hr at 05/29/12 1506  . DISCONTD:  sodium chloride 100 mL/hr (05/29/12 0525)  . DISCONTD: heparin Stopped (05/29/12 1310)    Assessment:  50 YOM with multiple myeloma on chronic coumadin 7.5mg  daily for vasculitis.  Coumadin held on admit due to NPO status, IV heparin started since INR < 2.0.  INR is therapeutic 7/12, so heparin stopped, coumadin resumed. Used coumadin 5mg  IV.  INR therapeutic this am and rising (2.26 >> 2.67)  Diet advanced 7/12, but no intake documented. No bleeding/complications reported.  Goal of Therapy:  INR = 2-3 Monitor platelets by anticoagulation protocol: Yes   Plan:   Coumadin 2.5mg  IV today (plan po tomorrow if tolerates diet)  Daily PT/INR  Loralee Pacas, PharmD, BCPS Pager: [phone] 05/30/2012,9:36 AM

## 2012-05-30 NOTE — Discharge Summary (Signed)
Physician Discharge Summary  Patient ID: [name] MRN: [id number] DOB/AGE: [birth year] 51 y.o.  Admit date: 05/28/2012 Discharge date: 05/30/2012  Admission Diagnoses: Upper airway obstruction due to supraglottitis  Multiple myeloma  H/O Vasculitis  Chronic anticoagulation  Chronic pain  Hypertension   Discharge Diagnoses:  Upper airway obstruction due to supraglottitis  Chronic ACEI therapy - doubt angioedema Multiple myeloma  H/O Vasculitis  Chronic anticoagulation  Chronic pain  Hypertension  Discharged Condition: good  Admission history: 51 yo with multiple myeloma and vasculitis on Coumadin presented to [facility] ED on day of admission with dyspnea, ST, stridor, subjective fever, difficulty swallowing. No wheezing, phlegm production or chest pain. Supine position aggravated dyspnea. There were no alleviating factors.  Admitting diagoses as above   Hospital Course: Seen by ENT. fiberoptic laryngoscopy revealed likely supraglottitis. Dr Emeline Darling (ENT) recommended admission to critical care and treat aggressively with usual medications for angioedema/supraglottitis picture: high dose IV decadron tapering to PO prednisone taper when improving clinically, H1 and H2 blockers, racemic epinephrine and humidified oxygen and monitor closely for clinical improvement. He improved substantially in a matter of hours and was nearly completely resolved @ the time of discharge. Although it is unlikely that his illness was precipitated by ACE-I therapy, it is felt that an alternative antihypertensive should be used. Please se changes in his medical regimen below  Consults:  Gore (ENT)   Significant Diagnostic Studies:  Soft tissue neck Xray 7/11: Prominence of the epiglottis is thought to reflect patient rotation and the patient's habitus; grossly unremarkable radiographs of the soft tissues of the neck. Fiberoptic laryngoscopy 7/11: The 4mm flexible scope was advanced through the right nasal  cavity. The septum shows a large anterior perforation with some crusting. The turbinates appeared grossly normal. The middle meatus was free of polyps of purulence but there are some thick physiologic secretions bilaterally. The eustachian tube, choana, and adenoids were normal in appearance. The epiglottis appears normal with no edema, but the false vocal folds are erythematous and edematous consistent with supraglottitis. The true vocal folds inferior to the edematous false vocal folds show only mild mucosal edema with a patent glottic airway. The subglottis is normal and patent. The true vocal folds show normal adduction and abduction.    Treatments:  Antibiotics Systemic steroids H2 blockers H1 blockers    Disposition: 01-Home or Self Care  Discharge Orders    Future Appointments: Provider: Department: Dept Phone: Center:   06/01/2012 1:30 PM Radene Gunning [facility] [phone] None   06/01/2012 2:00 PM [facility] C10 [facility] [phone] None   06/01/2012 2:15 PM [facility] Anti Coag [facility] [phone] None   06/04/2012 2:00 PM [facility] D12 [facility] [phone] None   06/15/2012 1:45 PM Marcelle Smiling Morris [facility] [phone] None   06/15/2012 2:15 PM Conni Slipper, PA [facility] [phone] None   06/15/2012 3:15 PM [facility] G23 [facility] [phone] None   06/18/2012 3:15 PM [facility] G22 [facility] [phone] None   06/22/2012 3:00 PM [facility] G22 [facility] [phone] None   06/25/2012 2:45 PM [facility] G22 [facility] [phone] None     Future Orders Please Complete By Expires   Discharge instructions      Scheduling Instructions:   Follow up with your previously scheduled appointments   Comments:   Call [facility] [phone]) if you have any recurrent problems with your breathing. Ask for the doctor on call.     Medication List  As of 05/30/2012  3:36  PM   STOP taking these medications          lisinopril-hydrochlorothiazide 20-12.5 MG per tablet         TAKE these medications         amitriptyline 50 MG tablet   Commonly known as: ELAVIL   Take 1 tablet (50 mg total) by mouth at bedtime.      amoxicillin-clavulanate 875-125 MG per tablet   Commonly known as: AUGMENTIN   Take 1 tablet by mouth 2 (two) times daily.      benzonatate 100 MG capsule   Commonly known as: TESSALON   Take 100 mg by mouth 3 (three) times daily as needed. For cough      dexamethasone 4 MG tablet   Commonly known as: DECADRON   Take 1 tablet (4 mg total) by mouth as directed. 10 tabs every week with chemo.      dexamethasone 4 MG tablet   Commonly known as: DECADRON   Take 1 tablet (4 mg total) by mouth daily.      gabapentin 300 MG capsule   Commonly known as: NEURONTIN   Take 1 capsule (300 mg total) by mouth 3 (three) times daily.      HYDROcodone-acetaminophen 5-500 MG per tablet   Commonly known as: VICODIN   Take 1 tablet by mouth every 6 (six) hours as needed.      irbesartan-hydrochlorothiazide 150-12.5 MG per tablet   Commonly known as: AVALIDE   Take 1 tablet by mouth daily.      morphine 15 MG 12 hr tablet   Commonly known as: MS CONTIN   Take 1 tablet (15 mg total) by mouth 2 (two) times daily.      omeprazole 40 MG capsule   Commonly known as: PRILOSEC   Take 1 capsule (40 mg total) by mouth daily.      potassium chloride SA 20 MEQ tablet   Commonly known as: K-DUR,KLOR-CON   Take 20 mEq by mouth daily as needed. Patients wife states that he only takes potassium when he is told that his potassium levels are low.      warfarin 5 MG tablet   Commonly known as: COUMADIN   Take 7.5 mg by mouth daily.             Signed: [name] 05/30/2012, 3:36 PM

## 2012-06-01 ENCOUNTER — Ambulatory Visit: Payer: 59

## 2012-06-01 ENCOUNTER — Encounter: Payer: Self-pay | Admitting: Pharmacist

## 2012-06-01 ENCOUNTER — Telehealth: Payer: Self-pay | Admitting: Internal Medicine

## 2012-06-01 ENCOUNTER — Other Ambulatory Visit: Payer: 59 | Admitting: Lab

## 2012-06-01 ENCOUNTER — Other Ambulatory Visit: Payer: Self-pay | Admitting: *Deleted

## 2012-06-01 NOTE — Progress Notes (Signed)
Per Dr Donnald Garre, will not get chemo this week, we will r/s his chemo to next week with f/u with AJ or [facility].  Will also r/s coumadin clinic visit as well per pharmacy.  Pt aware that appts cancelled this week and scheduling will call with new appts.  SLJ

## 2012-06-01 NOTE — Telephone Encounter (Signed)
called pt with  7/22 and 7/29 appt info  aom

## 2012-06-01 NOTE — Progress Notes (Signed)
Kathlee Nations, RN informed us pt d/c from [facility] & will not get tx today (7/15).  We had planned for pt to come to Coumadin clinic, however, on 05/30/12 INR = 2.67.  Pt discharged from [facility] on Coumadin 7.5 mg/day. Pt recently missed a few doses of Coumadin per Judeth Cornfield.  He will continue Coumadin 7.5 mg/day & we will see pt next week w/ sched labs/ MD visit, etc. Marily Lente, Pharm.D.

## 2012-06-02 ENCOUNTER — Telehealth: Payer: Self-pay | Admitting: *Deleted

## 2012-06-02 NOTE — Progress Notes (Signed)
Utilization review completed.  

## 2012-06-02 NOTE — Telephone Encounter (Signed)
Per staff message I have scheduled appt. [name] 

## 2012-06-04 ENCOUNTER — Ambulatory Visit: Payer: 59

## 2012-06-04 ENCOUNTER — Encounter: Payer: Self-pay | Admitting: Internal Medicine

## 2012-06-04 LAB — CULTURE, BLOOD (ROUTINE X 2): Culture: NO GROWTH

## 2012-06-04 NOTE — Progress Notes (Signed)
Put [employer] disability form on nurse's desk.

## 2012-06-08 ENCOUNTER — Ambulatory Visit: Payer: 59

## 2012-06-08 ENCOUNTER — Ambulatory Visit: Payer: 59 | Admitting: Pharmacist

## 2012-06-08 ENCOUNTER — Other Ambulatory Visit (HOSPITAL_BASED_OUTPATIENT_CLINIC_OR_DEPARTMENT_OTHER): Payer: 59 | Admitting: Lab

## 2012-06-08 ENCOUNTER — Ambulatory Visit (HOSPITAL_BASED_OUTPATIENT_CLINIC_OR_DEPARTMENT_OTHER): Payer: 59 | Admitting: Internal Medicine

## 2012-06-08 ENCOUNTER — Other Ambulatory Visit: Payer: Self-pay | Admitting: Medical Oncology

## 2012-06-08 VITALS — BP 154/91 | HR 75 | Temp 98.4°F | Ht 65.0 in | Wt 233.7 lb

## 2012-06-08 DIAGNOSIS — C9000 Multiple myeloma not having achieved remission: Secondary | ICD-10-CM

## 2012-06-08 DIAGNOSIS — I776 Arteritis, unspecified: Secondary | ICD-10-CM

## 2012-06-08 DIAGNOSIS — Z7901 Long term (current) use of anticoagulants: Secondary | ICD-10-CM

## 2012-06-08 DIAGNOSIS — Z86718 Personal history of other venous thrombosis and embolism: Secondary | ICD-10-CM

## 2012-06-08 DIAGNOSIS — Z5181 Encounter for therapeutic drug level monitoring: Secondary | ICD-10-CM

## 2012-06-08 LAB — COMPREHENSIVE METABOLIC PANEL
ALT: 19 U/L (ref 0–53)
AST: 13 U/L (ref 0–37)
Albumin: 3 g/dL — ABNORMAL LOW (ref 3.5–5.2)
CO2: 20 mEq/L (ref 19–32)
Calcium: 8.9 mg/dL (ref 8.4–10.5)
Chloride: 104 mEq/L (ref 96–112)
Potassium: 3.8 mEq/L (ref 3.5–5.3)
Sodium: 135 mEq/L (ref 135–145)
Total Protein: 8.6 g/dL — ABNORMAL HIGH (ref 6.0–8.3)

## 2012-06-08 LAB — CBC WITH DIFFERENTIAL/PLATELET
BASO%: 0.1 % (ref 0.0–2.0)
Eosinophils Absolute: 0 10*3/uL (ref 0.0–0.5)
MCHC: 34.5 g/dL (ref 32.0–36.0)
MONO#: 1.4 10*3/uL — ABNORMAL HIGH (ref 0.1–0.9)
NEUT#: 7.8 10*3/uL — ABNORMAL HIGH (ref 1.5–6.5)
RBC: 3.22 10*6/uL — ABNORMAL LOW (ref 4.20–5.82)
WBC: 10 10*3/uL (ref 4.0–10.3)
lymph#: 0.9 10*3/uL (ref 0.9–3.3)
nRBC: 1 % — ABNORMAL HIGH (ref 0–0)

## 2012-06-08 LAB — PROTHROMBIN TIME: Prothrombin Time: 40.9 seconds — ABNORMAL HIGH (ref 11.6–15.2)

## 2012-06-08 MED ORDER — PREGABALIN 75 MG PO CAPS: 75.0000 mg | ORAL_CAPSULE | Freq: Three times a day (TID) | ORAL | Status: DC

## 2012-06-08 MED ORDER — HYDROCODONE-ACETAMINOPHEN 5-500 MG PO TABS: 1.0000 | ORAL_TABLET | Freq: Four times a day (QID) | ORAL | Status: DC | PRN

## 2012-06-08 NOTE — Progress Notes (Signed)
INR = 4.17 on 7.5 mg/day Pt just completed Augmentin.  This may have contributed to elevated INR. Pt did not take extra Coumadin. Pt states he is currently not taking his weekly Decadron dose since his current chemo tx on hold for neuropathy. Dr. Arbutus Ped switching Gabapentin to Lyrica today. INR elevated.  Will have pt hold Coumadin x 2 doses then back to 7.5 mg/day. Repeat INR on 06/15/12

## 2012-06-08 NOTE — Progress Notes (Signed)
[facility] Telephone:([phone]) [phone]   Fax:([phone]  OFFICE PROGRESS NOTE  [name], MD [address] [place] [address]  Principle Diagnosis: #1 recurrent multiple myeloma IgG kappa subtype diagnosed in June of 2008 #2 history of vasculitis and thrombosis of skin lesions   Prior Therapy: #1 status post palliative radiotherapy to the left hip under the care of Dr. Roselind Messier  #2 status post 5 cycles of systemic chemotherapy with Revlimid and low dose Decadron. Last dose given June 2009 with good response.  #3 status post autologous peripheral blood stem cell transplant at [facility] on 02/02/2008.  #4 the patient had evidence for disease recurrence in December 2010.  #5 Revlimid 25 mg by mouth daily for 21 days every 4 weeks in addition to Decadron 40 mg orally on a weekly basis. The patient is status post 28 cycles, discontinued today secondary to disease progression.   Current therapy:  1. Zometa 4 mg IV given every 3 months.  2. Systemic chemotherapy with Velcade 1.3 mg/M2 on days 1, 4, 8 and 11 in addition to Doxil 30 mg/M2 on day 4 and Decadron 40 mg by mouth on weekly basis every 3 weeks. Status post 3 cycles   INTERVAL HISTORY: [name] 51 y.o. male returns to the clinic today for followup visit accompanied by his wife. The patient was recently admitted to [facility] intensive care unit because of severe shortness of breath secondary to falls focal cord edema and supraglottitis most likely was secondary to one of his blood pressure medication, ACE inhibitor which was discontinued. The patient was treated with high-dose IV and Decadron that was tapered to by mouth prednisone in addition to H1 and H2 blockers and racemic epinephrine. He felt much better and was discharged from the hospital 2 days later. He is feeling okay today except for the significant pain and peripheral neuropathy especially in the lower  extremities. He is currently on Neurontin 300 milligrams by mouth 3 times a day, in addition to hydrocodone and MS Contin with minimal improvement. He was here today to resume her systemic chemotherapy with Velcade Doxil and Decadron. He has no other significant complaints.  MEDICAL HISTORY: Past Medical History  Diagnosis Date  . Multiple myeloma 09/24/2011  . Hypertension     ALLERGIES:  is allergic to red dye and other.  MEDICATIONS:  Current Outpatient Prescriptions  Medication Sig Dispense Refill  . b complex vitamins tablet Take 1 tablet by mouth daily.      [name] dexamethasone (DECADRON) 4 MG tablet Take 1 tablet (4 mg total) by mouth as directed. 10 tabs every week with chemo.  40 tablet  4  . gabapentin (NEURONTIN) 300 MG capsule Take 1 capsule (300 mg total) by mouth 3 (three) times daily.  60 capsule  1  . HYDROcodone-acetaminophen (VICODIN) 5-500 MG per tablet Take 1 tablet by mouth every 6 (six) hours as needed.  60 tablet  0  . irbesartan-hydrochlorothiazide (AVALIDE) 150-12.5 MG per tablet Take 1 tablet by mouth daily.  30 tablet  5  . morphine (MS CONTIN) 15 MG 12 hr tablet Take 1 tablet (15 mg total) by mouth 2 (two) times daily.  60 tablet  0  . naproxen sodium (ANAPROX) 220 MG tablet Take 220 mg by mouth as needed. Pt takes 1 aleve "if pain in feet is outrageous."      . omeprazole (PRILOSEC) 40 MG capsule Take 1 capsule (  40 mg total) by mouth daily.  30 capsule  3  . potassium chloride SA (K-DUR,KLOR-CON) 20 MEQ tablet Take 20 mEq by mouth daily as needed. Patients wife states that he only takes potassium when he is told that his potassium levels are low.      . pregabalin (LYRICA) 75 MG capsule Take 1 capsule (75 mg total) by mouth 3 (three) times daily.  90 capsule  2  . warfarin (COUMADIN) 5 MG tablet Take 7.5 mg by mouth daily.        REVIEW OF SYSTEMS:  A comprehensive review of systems was negative except for: Constitutional: positive for fatigue Neurological:  positive for paresthesia   PHYSICAL EXAMINATION: General appearance: alert, cooperative and no distress Head: Normocephalic, without obvious abnormality, atraumatic Neck: no adenopathy Lymph nodes: Cervical, supraclavicular, and axillary nodes normal. Resp: clear to auscultation bilaterally Cardio: regular rate and rhythm, S1, S2 normal, no murmur, click, rub or gallop GI: soft, non-tender; bowel sounds normal; no masses,  no organomegaly Extremities: extremities normal, atraumatic, no cyanosis or edema Neurologic: Alert and oriented X 3, normal strength and tone. Normal symmetric reflexes. Normal coordination and gait  ECOG PERFORMANCE STATUS: 1 - Symptomatic but completely ambulatory  Blood pressure 154/91, pulse 75, temperature 98.4 F (36.9 C), temperature source Oral, height 5\' 5"  (1.651 m), weight 233 lb 11.2 oz (106.006 kg).  LABORATORY DATA: Lab Results  Component Value Date   WBC 10.0 06/08/2012   HGB 10.0* 06/08/2012   HCT 29.0* 06/08/2012   MCV 90.1 06/08/2012   PLT 224 06/08/2012      Chemistry      Component Value Date/Time   NA 130* 05/30/2012 0348   K 3.9 05/30/2012 0348   CL 100 05/30/2012 0348   CO2 21 05/30/2012 0348   BUN 15 05/30/2012 0348   CREATININE 0.56 05/30/2012 0348      Component Value Date/Time   CALCIUM 8.4 05/30/2012 0348   ALKPHOS 27* 05/29/2012 0001   AST 31 05/29/2012 0001   ALT 18 05/29/2012 0001   BILITOT 0.7 05/29/2012 0001       RADIOGRAPHIC STUDIES: Dg Neck Soft Tissue  05/29/2012  *RADIOLOGY REPORT*  Clinical Data: Sore throat and stridor.  NECK SOFT TISSUES - 1+ VIEW  Comparison: Cervical spine radiographs performed 12/02/2011  Findings: Prominence of the epiglottis is thought to reflect patient rotation and the patient's underlying habitus.  The aryepiglottic folds are not well characterized.  The hypopharynx is slightly prominent, but this may be transient in nature.  The nasopharynx and oropharynx are unremarkable in appearance.  The  proximal trachea is unremarkable.  Prevertebral soft tissues are within normal limits.  No radiopaque foreign bodies are seen.  No acute osseous abnormalities are identified.  The visualized paranasal sinuses and mastoid air cells are well-aerated.  Mild vascular congestion is noted at the visualized portions of the lungs.  IMPRESSION: Prominence of the epiglottis is thought to reflect patient rotation and the patient's habitus; grossly unremarkable radiographs of the soft tissues of the neck.  Original Report Authenticated By: Tonia Ghent, M.D.   Dg Chest Portable 1 View  05/29/2012  *RADIOLOGY REPORT*  Clinical Data: Sudden onset of shortness of breath.  PORTABLE CHEST - 1 VIEW  Comparison: Chest radiograph performed 10/30/2009  Findings: The lungs are well-aerated and clear.  There is no evidence of focal opacification, pleural effusion or pneumothorax.  The cardiomediastinal silhouette is within normal limits.  No acute osseous abnormalities are seen.  IMPRESSION: No  acute cardiopulmonary process seen.  Original Report Authenticated By: Tonia Ghent, M.D.    ASSESSMENT: This is a very pleasant 51 years old African American male with history of multiple myeloma most recently treated with 3 cycles of chemotherapy with Velcade, Doxil and Decadron with mixed response. The patient still has significant peripheral neuropathy.  PLAN: I have a lengthy discussion with the patient and his wife today about his current condition and treatment options. #1 I would hold his treatment with Velcade, Doxil and Decadron for now because of the significant peripheral neuropathy. #2 I will refer the patient back to Dr. Elmon Kirschner at [facility] for reevaluation for his second stem cell transplant or evaluation by Dr. Marissa Calamity for a second opinion regarding his chemotherapy. #3 for peripheral neuropathy, will change Neurontin to Lyrica 75 mg by mouth 3 times a day. He was also given a refill of Vicodin. He would  come back for followup visit in 3 weeks for evaluation and discussion of his treatment options after his visit to [facility].  All questions were answered. The patient knows to call the clinic with any problems, questions or concerns. We can certainly see the patient much sooner if necessary.  I spent 15 minutes counseling the patient face to face. The total time spent in the appointment was 25 minutes.

## 2012-06-09 ENCOUNTER — Telehealth: Payer: Self-pay | Admitting: *Deleted

## 2012-06-09 NOTE — Telephone Encounter (Signed)
Per staff message and POF I have canceled appts. [name]  

## 2012-06-09 NOTE — Telephone Encounter (Signed)
sent michelle email to cancel patient's treatment made patient follow  up appointment for [name] in at the end of august pend ing the patient appointment with [facility]

## 2012-06-15 ENCOUNTER — Other Ambulatory Visit: Payer: 59 | Admitting: Lab

## 2012-06-15 ENCOUNTER — Other Ambulatory Visit: Payer: Self-pay | Admitting: *Deleted

## 2012-06-15 ENCOUNTER — Ambulatory Visit (HOSPITAL_BASED_OUTPATIENT_CLINIC_OR_DEPARTMENT_OTHER): Payer: 59 | Admitting: Lab

## 2012-06-15 ENCOUNTER — Ambulatory Visit: Payer: 59 | Admitting: Physician Assistant

## 2012-06-15 ENCOUNTER — Ambulatory Visit (HOSPITAL_BASED_OUTPATIENT_CLINIC_OR_DEPARTMENT_OTHER): Payer: 59 | Admitting: Pharmacist

## 2012-06-15 ENCOUNTER — Ambulatory Visit: Payer: 59

## 2012-06-15 DIAGNOSIS — R52 Pain, unspecified: Secondary | ICD-10-CM

## 2012-06-15 DIAGNOSIS — Z5181 Encounter for therapeutic drug level monitoring: Secondary | ICD-10-CM

## 2012-06-15 DIAGNOSIS — C9000 Multiple myeloma not having achieved remission: Secondary | ICD-10-CM

## 2012-06-15 DIAGNOSIS — Z7901 Long term (current) use of anticoagulants: Secondary | ICD-10-CM

## 2012-06-15 DIAGNOSIS — I776 Arteritis, unspecified: Secondary | ICD-10-CM

## 2012-06-15 LAB — COMPREHENSIVE METABOLIC PANEL
Albumin: 3.2 g/dL — ABNORMAL LOW (ref 3.5–5.2)
Alkaline Phosphatase: 33 U/L — ABNORMAL LOW (ref 39–117)
BUN: 8 mg/dL (ref 6–23)
Calcium: 9.2 mg/dL (ref 8.4–10.5)
Chloride: 100 mEq/L (ref 96–112)
Glucose, Bld: 98 mg/dL (ref 70–99)
Potassium: 3.5 mEq/L (ref 3.5–5.3)

## 2012-06-15 LAB — CBC WITH DIFFERENTIAL/PLATELET
Basophils Absolute: 0 10*3/uL (ref 0.0–0.1)
Eosinophils Absolute: 0 10*3/uL (ref 0.0–0.5)
HCT: 31.8 % — ABNORMAL LOW (ref 38.4–49.9)
HGB: 10.9 g/dL — ABNORMAL LOW (ref 13.0–17.1)
MCV: 97.2 fL (ref 79.3–98.0)
MONO%: 14.5 % — ABNORMAL HIGH (ref 0.0–14.0)
NEUT#: 2.4 10*3/uL (ref 1.5–6.5)
NEUT%: 65.1 % (ref 39.0–75.0)
Platelets: 184 10*3/uL (ref 140–400)
RDW: 18 % — ABNORMAL HIGH (ref 11.0–14.6)

## 2012-06-15 LAB — PROTIME-INR

## 2012-06-15 MED ORDER — MORPHINE SULFATE ER 15 MG PO TBCR: 15.0000 mg | EXTENDED_RELEASE_TABLET | Freq: Two times a day (BID) | ORAL | Status: DC

## 2012-06-15 MED ORDER — HYDROCODONE-ACETAMINOPHEN 5-500 MG PO TABS: 1.0000 | ORAL_TABLET | Freq: Four times a day (QID) | ORAL | Status: DC | PRN

## 2012-06-15 NOTE — Progress Notes (Signed)
INR today supratherapeutic (3.41). No problems with bleeding or bruising.  No changes in meds besides the addition of Lyrica a couple weeks ago for his neuropathy.   Will have pt hold today's dose, then resume Coumadin tomorrow at slightly decreased dose of 7.5mg  daily except 5mg  on TuThSat.  Pt reports that he fills a pill box every week, so it will not be a problem for him to remember to take different doses on different days of the week.  Will recheck INR in 1 week to assess if pt returns to therapeutic range.

## 2012-06-18 ENCOUNTER — Ambulatory Visit: Payer: 59

## 2012-06-19 ENCOUNTER — Encounter: Payer: Self-pay | Admitting: *Deleted

## 2012-06-19 NOTE — Progress Notes (Signed)
Per pt request, calendar printed and mailed to pt.  SLJ

## 2012-06-22 ENCOUNTER — Ambulatory Visit (HOSPITAL_BASED_OUTPATIENT_CLINIC_OR_DEPARTMENT_OTHER): Payer: 59 | Admitting: Pharmacist

## 2012-06-22 ENCOUNTER — Ambulatory Visit: Payer: 59

## 2012-06-22 ENCOUNTER — Other Ambulatory Visit (HOSPITAL_BASED_OUTPATIENT_CLINIC_OR_DEPARTMENT_OTHER): Payer: 59 | Admitting: Lab

## 2012-06-22 DIAGNOSIS — Z7901 Long term (current) use of anticoagulants: Secondary | ICD-10-CM

## 2012-06-22 DIAGNOSIS — C9000 Multiple myeloma not having achieved remission: Secondary | ICD-10-CM

## 2012-06-22 DIAGNOSIS — I776 Arteritis, unspecified: Secondary | ICD-10-CM

## 2012-06-22 LAB — CBC WITH DIFFERENTIAL/PLATELET
Basophils Absolute: 0 10*3/uL (ref 0.0–0.1)
Eosinophils Absolute: 0 10*3/uL (ref 0.0–0.5)
HGB: 10.4 g/dL — ABNORMAL LOW (ref 13.0–17.1)
LYMPH%: 23 % (ref 14.0–49.0)
MCV: 95.4 fL (ref 79.3–98.0)
MONO%: 13.2 % (ref 0.0–14.0)
NEUT#: 2.4 10*3/uL (ref 1.5–6.5)
Platelets: 189 10*3/uL (ref 140–400)

## 2012-06-22 LAB — COMPREHENSIVE METABOLIC PANEL
Albumin: 3.4 g/dL — ABNORMAL LOW (ref 3.5–5.2)
Alkaline Phosphatase: 29 U/L — ABNORMAL LOW (ref 39–117)
BUN: 6 mg/dL (ref 6–23)
Glucose, Bld: 103 mg/dL — ABNORMAL HIGH (ref 70–99)
Total Bilirubin: 0.6 mg/dL (ref 0.3–1.2)

## 2012-06-22 MED ORDER — WARFARIN SODIUM 5 MG PO TABS: 7.5000 mg | ORAL_TABLET | ORAL | Status: DC

## 2012-06-22 NOTE — Progress Notes (Signed)
Continue 7.5mg daily except 5mg on Tuesday, Thursday, and Saturday.   Recheck INR in 10 days on 07/02/12 at 2:15 pm for lab; 2:30 pm for Coumadin clinic.   

## 2012-06-22 NOTE — Patient Instructions (Signed)
Continue 7.5mg  daily except 5mg  on Tuesday, Thursday, and Saturday.   Recheck INR in 10 days on 07/02/12 at 2:15 pm for lab; 2:30 pm for Coumadin clinic.

## 2012-06-25 ENCOUNTER — Ambulatory Visit: Payer: 59

## 2012-06-25 ENCOUNTER — Other Ambulatory Visit: Payer: Self-pay | Admitting: *Deleted

## 2012-06-25 DIAGNOSIS — C9000 Multiple myeloma not having achieved remission: Secondary | ICD-10-CM

## 2012-06-25 NOTE — Progress Notes (Signed)
Pt called requesting for zometa appt to be done when he comes in on 8/15 and he would also like to see a "foot MD" about the neuropathy in his feet.  He also has not heard from [facility] regarding an appt.  Per Dr Donnald Garre, okay to give zometa on 8/15, referral to [facility] neurology request sent, and called Tiffany in medical records and she has contacted [facility] again regarding pt appt.  SLJ

## 2012-06-26 ENCOUNTER — Telehealth: Payer: Self-pay | Admitting: Internal Medicine

## 2012-06-26 NOTE — Telephone Encounter (Signed)
Ref form filled out and sent to HIM to fax records      aom

## 2012-06-26 NOTE — Telephone Encounter (Signed)
Pt also aware of 8/15 appts   aom

## 2012-06-29 ENCOUNTER — Telehealth: Payer: Self-pay | Admitting: Medical Oncology

## 2012-06-29 ENCOUNTER — Telehealth: Payer: Self-pay | Admitting: Internal Medicine

## 2012-06-29 NOTE — Telephone Encounter (Signed)
Pt. Appt. With Dr. Elmon Kirschner @ [facility] 07/22/12 @ 1:00. Pt is aware.

## 2012-06-29 NOTE — Telephone Encounter (Signed)
Concerned that his throat is sore again  Like in July  when he ended up in hospital  for supraglottitis - (. Dr Emeline Darling consulted). [name]  is only drinking liquids because they are soothing.  A BP med was discontinued he said because it may be a factor in dx. PEr Dr Donnald Garre pt needs to contact Dr Emeline Darling . I gave pt Dr Ellyn Hack phone number and he said he would call him.I called pt back and he was not able to contact Dr Emeline Darling so I instructed him to go to nearest ED or call 911.[name]  He said he would if he feels like his throat is closing up.

## 2012-07-02 ENCOUNTER — Ambulatory Visit (HOSPITAL_BASED_OUTPATIENT_CLINIC_OR_DEPARTMENT_OTHER): Payer: 59

## 2012-07-02 ENCOUNTER — Other Ambulatory Visit (HOSPITAL_BASED_OUTPATIENT_CLINIC_OR_DEPARTMENT_OTHER): Payer: 59 | Admitting: Lab

## 2012-07-02 ENCOUNTER — Ambulatory Visit: Payer: 59 | Admitting: Pharmacist

## 2012-07-02 ENCOUNTER — Other Ambulatory Visit: Payer: Self-pay | Admitting: Physician Assistant

## 2012-07-02 ENCOUNTER — Other Ambulatory Visit: Payer: Self-pay | Admitting: *Deleted

## 2012-07-02 DIAGNOSIS — Z7901 Long term (current) use of anticoagulants: Secondary | ICD-10-CM

## 2012-07-02 DIAGNOSIS — C9000 Multiple myeloma not having achieved remission: Secondary | ICD-10-CM

## 2012-07-02 DIAGNOSIS — I776 Arteritis, unspecified: Secondary | ICD-10-CM

## 2012-07-02 LAB — COMPREHENSIVE METABOLIC PANEL
ALT: 14 U/L (ref 0–53)
Albumin: 3.4 g/dL — ABNORMAL LOW (ref 3.5–5.2)
CO2: 22 mEq/L (ref 19–32)
Calcium: 8.6 mg/dL (ref 8.4–10.5)
Chloride: 103 mEq/L (ref 96–112)
Creatinine, Ser: 0.49 mg/dL — ABNORMAL LOW (ref 0.50–1.35)
Potassium: 3.6 mEq/L (ref 3.5–5.3)
Total Protein: 9.1 g/dL — ABNORMAL HIGH (ref 6.0–8.3)

## 2012-07-02 LAB — CBC WITH DIFFERENTIAL/PLATELET
Eosinophils Absolute: 0 10*3/uL (ref 0.0–0.5)
HCT: 28.5 % — ABNORMAL LOW (ref 38.4–49.9)
LYMPH%: 16.2 % (ref 14.0–49.0)
MONO#: 0.7 10*3/uL (ref 0.1–0.9)
NEUT#: 3.7 10*3/uL (ref 1.5–6.5)
Platelets: 203 10*3/uL (ref 140–400)
RBC: 3.18 10*6/uL — ABNORMAL LOW (ref 4.20–5.82)
WBC: 5.3 10*3/uL (ref 4.0–10.3)
lymph#: 0.9 10*3/uL (ref 0.9–3.3)
nRBC: 0 % (ref 0–0)

## 2012-07-02 LAB — PROTIME-INR: Protime: 26.4 Seconds — ABNORMAL HIGH (ref 10.6–13.4)

## 2012-07-02 MED ORDER — OXYCODONE-ACETAMINOPHEN 5-325 MG PO TABS
1.0000 | ORAL_TABLET | Freq: Once | ORAL | Status: AC
  Administered 2012-07-02: 1 via ORAL

## 2012-07-02 MED ORDER — ZOLEDRONIC ACID 4 MG/100ML IV SOLN
4.0000 mg | Freq: Once | INTRAVENOUS | Status: AC
  Administered 2012-07-02: 4 mg via INTRAVENOUS
  Filled 2012-07-02: qty 100

## 2012-07-02 MED ORDER — SODIUM CHLORIDE 0.9 % IV SOLN
Freq: Once | INTRAVENOUS | Status: AC
  Administered 2012-07-02: 15:00:00 via INTRAVENOUS

## 2012-07-02 NOTE — Progress Notes (Signed)
INR therapeutic (2.2) today No changes.  No complaints.   Will continue current dose of 7.5mg  daily except 5mg  on TuThSa Recheck INR in ~2 weeks with existing Lab/MD appt.

## 2012-07-13 ENCOUNTER — Other Ambulatory Visit: Payer: Self-pay | Admitting: *Deleted

## 2012-07-13 ENCOUNTER — Telehealth: Payer: Self-pay | Admitting: *Deleted

## 2012-07-13 DIAGNOSIS — R52 Pain, unspecified: Secondary | ICD-10-CM

## 2012-07-13 MED ORDER — MORPHINE SULFATE ER 15 MG PO TBCR: 15.0000 mg | EXTENDED_RELEASE_TABLET | Freq: Two times a day (BID) | ORAL | Status: DC

## 2012-07-13 MED ORDER — OXYCODONE HCL 5 MG PO TABS: 5.0000 mg | ORAL_TABLET | ORAL | Status: AC | PRN

## 2012-07-13 NOTE — Progress Notes (Signed)
Pt will be seeing Dr Vicente Serene on 9/4.  Per Dr Donnald Garre need to r/s f/u from 8/27 to after he goes to [facility].  SLJ

## 2012-07-13 NOTE — Telephone Encounter (Signed)
Called to inform pt he would not be coming for f/u appt tomorrow because he has not seen Dr Vicente Serene at [facility] yet.  Pt stated that he would like to see if Dr Donnald Garre would prescribe him oxycodone instead of vicodin.  He took one of his wife's this weekend and said it helped with the pain.  Informed him that he should not take other people's rx's.  Spoke with [name], she stated okay to give pt rx for oxy 5 q4h prn pain.  SLJ

## 2012-07-14 ENCOUNTER — Telehealth: Payer: Self-pay | Admitting: Medical Oncology

## 2012-07-14 ENCOUNTER — Ambulatory Visit: Payer: 59

## 2012-07-14 ENCOUNTER — Other Ambulatory Visit: Payer: 59 | Admitting: Lab

## 2012-07-14 ENCOUNTER — Ambulatory Visit: Payer: 59 | Admitting: Internal Medicine

## 2012-07-14 NOTE — Telephone Encounter (Signed)
Pt has his med refills

## 2012-07-16 ENCOUNTER — Telehealth: Payer: Self-pay | Admitting: Internal Medicine

## 2012-07-16 NOTE — Telephone Encounter (Signed)
S/w pt re appt for 9/9.  °

## 2012-07-24 ENCOUNTER — Telehealth: Payer: Self-pay | Admitting: *Deleted

## 2012-07-24 ENCOUNTER — Other Ambulatory Visit: Payer: Self-pay | Admitting: *Deleted

## 2012-07-24 DIAGNOSIS — C9000 Multiple myeloma not having achieved remission: Secondary | ICD-10-CM

## 2012-07-24 NOTE — Telephone Encounter (Signed)
Office visit note from [facility] given to Dr Donnald Garre to review.  Will be scanned into EPIC.

## 2012-07-27 ENCOUNTER — Ambulatory Visit (HOSPITAL_BASED_OUTPATIENT_CLINIC_OR_DEPARTMENT_OTHER): Payer: 59 | Admitting: Internal Medicine

## 2012-07-27 ENCOUNTER — Telehealth: Payer: Self-pay | Admitting: Internal Medicine

## 2012-07-27 ENCOUNTER — Other Ambulatory Visit (HOSPITAL_BASED_OUTPATIENT_CLINIC_OR_DEPARTMENT_OTHER): Payer: 59 | Admitting: Lab

## 2012-07-27 VITALS — BP 127/78 | HR 81 | Temp 97.4°F | Resp 18 | Ht 65.0 in | Wt 229.4 lb

## 2012-07-27 DIAGNOSIS — C9000 Multiple myeloma not having achieved remission: Secondary | ICD-10-CM

## 2012-07-27 DIAGNOSIS — I776 Arteritis, unspecified: Secondary | ICD-10-CM

## 2012-07-27 LAB — COMPREHENSIVE METABOLIC PANEL (CC13)
ALT: 13 U/L (ref 0–55)
AST: 14 U/L (ref 5–34)
Albumin: 3 g/dL — ABNORMAL LOW (ref 3.5–5.0)
CO2: 24 mEq/L (ref 22–29)
Calcium: 8.6 mg/dL (ref 8.4–10.4)
Chloride: 103 mEq/L (ref 98–107)
Potassium: 4.1 mEq/L (ref 3.5–5.1)
Total Protein: 9.5 g/dL — ABNORMAL HIGH (ref 6.4–8.3)

## 2012-07-27 LAB — CBC WITH DIFFERENTIAL/PLATELET
BASO%: 0.2 % (ref 0.0–2.0)
LYMPH%: 18 % (ref 14.0–49.0)
MCH: 33.2 pg (ref 27.2–33.4)
MCHC: 34.5 g/dL (ref 32.0–36.0)
MCV: 96.2 fL (ref 79.3–98.0)
MONO%: 9.1 % (ref 0.0–14.0)
NEUT%: 71.1 % (ref 39.0–75.0)
Platelets: 167 10*3/uL (ref 140–400)
RBC: 2.95 10*6/uL — ABNORMAL LOW (ref 4.20–5.82)

## 2012-07-27 MED ORDER — OXYCODONE HCL 5 MG PO CAPS: 5.0000 mg | ORAL_CAPSULE | ORAL | Status: DC | PRN

## 2012-07-27 NOTE — Telephone Encounter (Signed)
Called IR for Bone Marrow biopsy , they call patient, instructed pt to call us if appt not set up within this week

## 2012-07-27 NOTE — Patient Instructions (Signed)
We will arrange a bone marrow biopsy and aspirate as requested by Dr. Vicente Serene. Followup as scheduled by Dr. Vicente Serene for the bone marrow transplant. Call the office after completion of the bone marrow transplant procedure for followup visit. Continue lytic and oxycodone for the peripheral neuropathy.

## 2012-07-27 NOTE — Progress Notes (Signed)
[facility] [phone]   [phone]  OFFICE PROGRESS NOTE  [name], MD [address] [place] [address]  Principle Diagnosis: #1 recurrent multiple myeloma IgG kappa subtype diagnosed in June of 2008 #2 history of vasculitis and thrombosis of skin lesions   Prior Therapy: #1 status post palliative radiotherapy to the left hip under the care of Dr. Roselind Messier  #2 status post 5 cycles of systemic chemotherapy with Revlimid and low dose Decadron. Last dose given June 2009 with good response.  #3 status post autologous peripheral blood stem cell transplant at [facility] on 02/02/2008.  #4 the patient had evidence for disease recurrence in December 2010.  #5 Revlimid 25 mg by mouth daily for 21 days every 4 weeks in addition to Decadron 40 mg orally on a weekly basis. The patient is status post 28 cycles, discontinued today secondary to disease progression. #6 Systemic chemotherapy with Velcade 1.3 mg/M2 on days 1, 4, 8 and 11 in addition to Doxil 30 mg/M2 on day 4 and Decadron 40 mg by mouth on weekly basis every 3 weeks. Status post 3 cycles, last dose was given 05/04/2012 discontinued secondary to intolerance.   Current therapy:  Zometa 4 mg IV given every 3 months.  INTERVAL HISTORY: [name] 51 y.o. male returns to the clinic today for followup visit. The patient is feeling fine today except for the peripheral neuropathy and he is currently on Lyrica 75 mg by mouth 3 times a day in addition to oxycodone 1-2 tablets every 6 hours as needed for pain. The patient is currently undergoing evaluation for a second bone marrow transplant under the care of Dr. Vicente Serene at [facility]. He otherwise doing fine with no specific complaints.. he denied having any significant chest pain, shortness of breath, cough or hemoptysis. He denied having any weight loss or night sweats. He has no fever or chills.   MEDICAL  HISTORY: Past Medical History  Diagnosis Date  . Multiple myeloma 09/24/2011  . Hypertension     ALLERGIES:  is allergic to red dye and other.  MEDICATIONS:  Current Outpatient Prescriptions  Medication Sig Dispense Refill  . irbesartan-hydrochlorothiazide (AVALIDE) 150-12.5 MG per tablet Take 1 tablet by mouth daily.  30 tablet  5  . morphine (MS CONTIN) 15 MG 12 hr tablet Take 1 tablet (15 mg total) by mouth 2 (two) times daily.  60 tablet  0  . omeprazole (PRILOSEC) 40 MG capsule Take 1 capsule (40 mg total) by mouth daily.  30 capsule  3  . oxycodone (OXY-IR) 5 MG capsule Take 1 capsule (5 mg total) by mouth every 4 (four) hours as needed. #60  60 capsule  0  . pregabalin (LYRICA) 75 MG capsule Take 1 capsule (75 mg total) by mouth 3 (three) times daily.  90 capsule  2  . warfarin (COUMADIN) 5 MG tablet Take 1.5 tablets (7.5 mg total) by mouth as directed. 7.5mg  daily except 5mg  on TuThuSat  40 tablet  2  . b complex vitamins tablet Take 1 tablet by mouth daily.      . ondansetron (ZOFRAN) 8 MG tablet Take 1 tablet by mouth as needed.      . potassium chloride SA (K-DUR,KLOR-CON) 20 MEQ tablet Take 20 mEq by mouth daily as needed. Patients wife states that he only takes potassium when he is told that his potassium levels are low.  REVIEW OF SYSTEMS:  A comprehensive review of systems was negative except for: Neurological: positive for paresthesia   PHYSICAL EXAMINATION: General appearance: alert, cooperative and no distress Head: Normocephalic, without obvious abnormality, atraumatic Neck: no adenopathy Lymph nodes: Cervical, supraclavicular, and axillary nodes normal. Resp: clear to auscultation bilaterally Cardio: regular rate and rhythm, S1, S2 normal, no murmur, click, rub or gallop GI: soft, non-tender; bowel sounds normal; no masses,  no organomegaly Extremities: extremities normal, atraumatic, no cyanosis or edema Neurologic: Alert and oriented X 3, normal strength  and tone. Normal symmetric reflexes. Normal coordination and gait   ECOG PERFORMANCE STATUS: 1 - Symptomatic but completely ambulatory  Blood pressure 127/78, pulse 81, temperature 97.4 F (36.3 C), temperature source Oral, resp. rate 18, height 5\' 5"  (1.651 m), weight 229 lb 6.4 oz (104.055 kg).  LABORATORY DATA: Lab Results  Component Value Date   WBC 4.8 07/27/2012   HGB 9.8* 07/27/2012   HCT 28.4* 07/27/2012   MCV 96.2 07/27/2012   PLT 167 07/27/2012      Chemistry      Component Value Date/Time   NA 133* 07/27/2012 1110   NA 132* 07/02/2012 1406   K 4.1 07/27/2012 1110   K 3.6 07/02/2012 1406   CL 103 07/27/2012 1110   CL 103 07/02/2012 1406   CO2 24 07/27/2012 1110   CO2 22 07/02/2012 1406   BUN 8.0 07/27/2012 1110   BUN 8 07/02/2012 1406   CREATININE 0.6* 07/27/2012 1110   CREATININE 0.49* 07/02/2012 1406      Component Value Date/Time   CALCIUM 8.6 07/27/2012 1110   CALCIUM 8.6 07/02/2012 1406   ALKPHOS 25* 07/27/2012 1110   ALKPHOS 29* 07/02/2012 1406   AST 14 07/27/2012 1110   AST 15 07/02/2012 1406   ALT 13 07/27/2012 1110   ALT 14 07/02/2012 1406   BILITOT 0.50 07/27/2012 1110   BILITOT 0.4 07/02/2012 1406       RADIOGRAPHIC STUDIES: No results found.  ASSESSMENT: This is a very pleasant 51 years old African American male with recurrent multiple myeloma status post treatment with salvage Velcade, Doxil and Decadron but was unable to tolerated the treatment secondary to significant peripheral neuropathy especially in the lower extremities and mild disease progression. He is currently undergoing evaluation for a second bone marrow transplant at [facility]  PLAN: I will arrange for the patient to have bone marrow biopsy and aspirate as requested by Dr. Vicente Serene for evaluation of his disease before the transplant. This will be done by interventional radiology and will fax the results with Dr. Vicente Serene at [facility]. For the peripheral neuropathy the patient will continue on Lyrica 75 mg  by mouth Q8 hours in addition to oxycodone as needed. He was advised to call me after completion of his transplant, so we can schedule his routine followup visit at that time. He was also advised to call me immediately if he has any concerning symptoms in the interval. For the history of vasculitis and thrombosis of the skin veins the patient is currently on Coumadin and he would be followed by the Coumadin clinic. All questions were answered. The patient knows to call the clinic with any problems, questions or concerns. We can certainly see the patient much sooner if necessary.

## 2012-07-28 ENCOUNTER — Other Ambulatory Visit: Payer: Self-pay | Admitting: Medical Oncology

## 2012-07-28 DIAGNOSIS — C9000 Multiple myeloma not having achieved remission: Secondary | ICD-10-CM

## 2012-07-28 DIAGNOSIS — Z7901 Long term (current) use of anticoagulants: Secondary | ICD-10-CM

## 2012-07-28 NOTE — Progress Notes (Signed)
Pt notified to hold coumadin 4 days prior to Bone marrow biopsy and to come in for lab on 9/19 at 0800. Pt voices understanding.

## 2012-07-28 NOTE — Telephone Encounter (Signed)
Calling to inquire if pt on blood thinners. I called her back -pt on coumadin as listed on med list and left message

## 2012-07-30 ENCOUNTER — Encounter (HOSPITAL_COMMUNITY): Payer: Self-pay | Admitting: Pharmacy Technician

## 2012-07-30 ENCOUNTER — Telehealth: Payer: Self-pay | Admitting: Medical Oncology

## 2012-07-30 ENCOUNTER — Other Ambulatory Visit: Payer: Self-pay | Admitting: Radiology

## 2012-07-30 DIAGNOSIS — C9000 Multiple myeloma not having achieved remission: Secondary | ICD-10-CM

## 2012-07-30 MED ORDER — PREGABALIN 75 MG PO CAPS: 75.0000 mg | ORAL_CAPSULE | Freq: Three times a day (TID) | ORAL | Status: DC

## 2012-07-30 NOTE — Telephone Encounter (Signed)
08-06-2012 at 8:30am per orders patient has been notified

## 2012-07-30 NOTE — Telephone Encounter (Signed)
States his insurance will only pay for Lyrica twice a year. I called in new rx.

## 2012-07-31 ENCOUNTER — Telehealth: Payer: Self-pay | Admitting: Medical Oncology

## 2012-07-31 DIAGNOSIS — G629 Polyneuropathy, unspecified: Secondary | ICD-10-CM

## 2012-07-31 MED ORDER — GABAPENTIN 100 MG PO CAPS: 100.0000 mg | ORAL_CAPSULE | Freq: Three times a day (TID) | ORAL | Status: DC

## 2012-07-31 NOTE — Telephone Encounter (Signed)
I returned pts call . He cannot afford Lyrica . He has a red dye allergy and neurontin 300 mg capsule has a red dye in it .  I called pharmacist at [facility] and she said  the 100 mg tablet does not have red dye in it. Pt notified

## 2012-08-06 ENCOUNTER — Other Ambulatory Visit (HOSPITAL_BASED_OUTPATIENT_CLINIC_OR_DEPARTMENT_OTHER): Payer: 59 | Admitting: Lab

## 2012-08-06 ENCOUNTER — Ambulatory Visit (HOSPITAL_COMMUNITY)
Admission: RE | Admit: 2012-08-06 | Discharge: 2012-08-06 | Disposition: A | Discharge status: Home / Self Care | Payer: 59 | Source: Ambulatory Visit | Attending: Internal Medicine | Admitting: Internal Medicine

## 2012-08-06 DIAGNOSIS — Z7901 Long term (current) use of anticoagulants: Secondary | ICD-10-CM

## 2012-08-06 DIAGNOSIS — C9000 Multiple myeloma not having achieved remission: Secondary | ICD-10-CM

## 2012-08-06 DIAGNOSIS — I10 Essential (primary) hypertension: Secondary | ICD-10-CM | POA: Insufficient documentation

## 2012-08-06 DIAGNOSIS — Z79899 Other long term (current) drug therapy: Secondary | ICD-10-CM | POA: Insufficient documentation

## 2012-08-06 LAB — PROTIME-INR
INR: 1.1 — ABNORMAL LOW (ref 2.00–3.50)
Protime: 13.2 Seconds (ref 10.6–13.4)

## 2012-08-06 LAB — CBC
HCT: 29.6 % — ABNORMAL LOW (ref 39.0–52.0)
Hemoglobin: 10.2 g/dL — ABNORMAL LOW (ref 13.0–17.0)
MCV: 91.6 fL (ref 78.0–100.0)
RBC: 3.23 MIL/uL — ABNORMAL LOW (ref 4.22–5.81)
WBC: 4.2 10*3/uL (ref 4.0–10.5)

## 2012-08-06 MED ORDER — MIDAZOLAM HCL 5 MG/5ML IJ SOLN
INTRAMUSCULAR | Status: AC | PRN
  Administered 2012-08-06: 2 mg via INTRAVENOUS
  Administered 2012-08-06 (×2): 1 mg via INTRAVENOUS

## 2012-08-06 MED ORDER — MIDAZOLAM HCL 2 MG/2ML IJ SOLN
INTRAMUSCULAR | Status: AC
  Filled 2012-08-06: qty 6

## 2012-08-06 MED ORDER — SODIUM CHLORIDE 0.9 % IV SOLN
Freq: Once | INTRAVENOUS | Status: AC
  Administered 2012-08-06: 500 mL via INTRAVENOUS

## 2012-08-06 MED ORDER — FENTANYL CITRATE 0.05 MG/ML IJ SOLN
INTRAMUSCULAR | Status: AC | PRN
  Administered 2012-08-06 (×2): 100 ug via INTRAVENOUS

## 2012-08-06 MED ORDER — FENTANYL CITRATE 0.05 MG/ML IJ SOLN
INTRAMUSCULAR | Status: AC
  Filled 2012-08-06: qty 6

## 2012-08-06 NOTE — Procedures (Signed)
Interventional Radiology Procedure Note  Procedure: CT guided bone marrow biopsy, right iliac bone Complications: None Recommendations: - Bedrest x 2 hrs  Signed,  Sterling Big, MD Vascular & Interventional Radiologist [facility]

## 2012-08-06 NOTE — H&P (Signed)
[name] is an 51 y.o. male.   Chief Complaint: "I'm here for a bone marrow biopsy" HPI: Patient with history of recurrent multiple myeloma presents today for CT guided bone marrow biopsy prior to bone marrow transplant.  Past Medical History  Diagnosis Date  . Multiple myeloma 09/24/2011  . Hypertension     No past surgical history on file.  No family history on file. Social History:  reports that he has never smoked. He does not have any smokeless tobacco history on file. His alcohol and drug histories not on file.  Allergies:  Allergies  Allergen Reactions  . Red Dye Anaphylaxis    Lips swollen  3-4 times their baseline size  . Other Other (See Comments)    Strawberries "anything containing red dye"    Current outpatient prescriptions:gabapentin (NEURONTIN) 100 MG capsule, Take 1 capsule (100 mg total) by mouth 3 (three) times daily., Disp: 270 capsule, Rfl: 1;  irbesartan-hydrochlorothiazide (AVALIDE) 300-12.5 MG per tablet, Take 1 tablet by mouth daily with breakfast., Disp: , Rfl: ;  morphine (MS CONTIN) 15 MG 12 hr tablet, Take 1 tablet (15 mg total) by mouth 2 (two) times daily., Disp: 60 tablet, Rfl: 0 omeprazole (PRILOSEC) 40 MG capsule, Take 1 capsule (40 mg total) by mouth daily., Disp: 30 capsule, Rfl: 3;  oxycodone (OXY-IR) 5 MG capsule, Take 1 capsule (5 mg total) by mouth every 4 (four) hours as needed. #60, Disp: 60 capsule, Rfl: 0;  acetaminophen (TYLENOL) 500 MG tablet, Take 500 mg by mouth every 6 (six) hours as needed. Pain, Disp: , Rfl:  potassium chloride SA (K-DUR,KLOR-CON) 20 MEQ tablet, Take 20 mEq by mouth daily as needed. Patients wife states that he only takes potassium when he is told that his potassium levels are low., Disp: , Rfl: ;  warfarin (COUMADIN) 5 MG tablet, Take 5-7.5 mg by mouth daily. Takes 5 mg Tuesday,thursday and Saturday and takes 1.5 on Monday Wednesday Friday and sunday, Disp: , Rfl:  Current facility-administered medications:0.9 %   sodium chloride infusion, , Intravenous, Once, Brayton El, PA, Last Rate: 20 mL/hr at 08/06/12 0944, 500 mL at 08/06/12 0944   Results for orders placed during the hospital encounter of 08/06/12 (from the past 48 hour(s))  APTT     Status: Normal   Collection Time   08/06/12  9:35 AM      Component Value Range Comment   aPTT 32  24 - 37 seconds   CBC     Status: Abnormal   Collection Time   08/06/12  9:35 AM      Component Value Range Comment   WBC 4.2  4.0 - 10.5 K/uL    RBC 3.23 (*) 4.22 - 5.81 MIL/uL    Hemoglobin 10.2 (*) 13.0 - 17.0 g/dL    HCT 82.9 (*) 56.2 - 52.0 %    MCV 91.6  78.0 - 100.0 fL    MCH 31.6  26.0 - 34.0 pg    MCHC 34.5  30.0 - 36.0 g/dL    RDW 13.0  86.5 - 78.4 %    Platelets 197  150 - 400 K/uL   PROTIME-INR     Status: Normal   Collection Time   08/06/12  9:35 AM      Component Value Range Comment   Prothrombin Time 14.3  11.6 - 15.2 seconds    INR 1.13  0.00 - 1.49    No results found.  Review of Systems  Constitutional: Negative for  fever and chills.  Respiratory: Negative for cough and shortness of breath.   Cardiovascular: Negative for chest pain.  Gastrointestinal: Negative for nausea, vomiting and abdominal pain.  Musculoskeletal: Negative for back pain.  Neurological: Negative for headaches.       Peripheral neuropathy    Blood pressure 135/70, pulse 81, temperature 98.2 F (36.8 C), temperature source Oral, resp. rate 18, height 5\' 4"  (1.626 m), weight 223 lb (101.152 kg), SpO2 100.00%. Physical Exam  Constitutional: He is oriented to person, place, and time. He appears well-developed and well-nourished.  Cardiovascular: Normal rate and regular rhythm.   Respiratory: Effort normal and breath sounds normal.  GI: Soft. Bowel sounds are normal.  Musculoskeletal: Normal range of motion. He exhibits no edema.  Neurological: He is alert and oriented to person, place, and time.     Assessment/Plan Patient with recurrent multiple myeloma.  Plan is for CT guided bone marrow biopsy today to evaluate disease prior to bone marrow transplant. Details/risks of procedure d/w pt/wife with their understanding and consent.  [name],D KEVIN 08/06/2012, 10:11 AM

## 2012-08-06 NOTE — H&P (Signed)
Agree with PA note.    Signed,  Jameria Bradway K. Djuna Frechette, MD Vascular & Interventional Radiologist [facility]  

## 2012-08-11 ENCOUNTER — Telehealth: Payer: Self-pay | Admitting: Medical Oncology

## 2012-08-11 NOTE — Telephone Encounter (Signed)
Faxed BM report to Dr Vicente Serene at [facility]

## 2012-08-12 ENCOUNTER — Telehealth: Payer: Self-pay | Admitting: Medical Oncology

## 2012-08-12 NOTE — Telephone Encounter (Signed)
Requests bm bx result be faxed to Dr Vicente Serene. This was done yesterday and pt notified.

## 2012-08-17 ENCOUNTER — Other Ambulatory Visit (HOSPITAL_BASED_OUTPATIENT_CLINIC_OR_DEPARTMENT_OTHER): Payer: 59 | Admitting: Lab

## 2012-08-17 ENCOUNTER — Telehealth: Payer: Self-pay | Admitting: Pharmacist

## 2012-08-17 ENCOUNTER — Ambulatory Visit (HOSPITAL_BASED_OUTPATIENT_CLINIC_OR_DEPARTMENT_OTHER): Payer: 59 | Admitting: Pharmacist

## 2012-08-17 DIAGNOSIS — I776 Arteritis, unspecified: Secondary | ICD-10-CM

## 2012-08-17 DIAGNOSIS — Z7901 Long term (current) use of anticoagulants: Secondary | ICD-10-CM

## 2012-08-17 LAB — PROTIME-INR
INR: 1.9 — ABNORMAL LOW (ref 2.00–3.50)
Protime: 22.8 Seconds — ABNORMAL HIGH (ref 10.6–13.4)

## 2012-08-17 LAB — POCT INR: INR: 1.9

## 2012-08-17 NOTE — Progress Notes (Signed)
INR close to goal upon resuming previous coumadin dose.  Will cont current coumadin dose and check PT/INR in 2 weeks to ensure at goal.  Pt c/o of neuropathy in bilateral feet.  Had been on Lyrica which said was effective, but had to switch to gabapentin for insurance reasons.  Pt taking gabapentin 100mg  TID will discuss with PA or MD increasing gabapentin dose to help relieve neuropathies.

## 2012-08-17 NOTE — Telephone Encounter (Signed)
Per discussion with Dr. Arbutus Ped, instructed pt to increase gabapentin to 200mg  TID and will reevaluate pt at next Coumadin clinic visit in 2 weeks.  Pt understood gabapentin dose change.

## 2012-08-26 ENCOUNTER — Telehealth: Payer: Self-pay | Admitting: *Deleted

## 2012-08-26 NOTE — Telephone Encounter (Signed)
Per request from Bevelyn Ngo, BMBX report faxed to [facility].  SLJ

## 2012-08-28 ENCOUNTER — Telehealth: Payer: Self-pay | Admitting: *Deleted

## 2012-08-28 ENCOUNTER — Other Ambulatory Visit: Payer: Self-pay | Admitting: *Deleted

## 2012-08-28 ENCOUNTER — Encounter: Payer: Self-pay | Admitting: Pharmacist

## 2012-08-28 DIAGNOSIS — C9000 Multiple myeloma not having achieved remission: Secondary | ICD-10-CM

## 2012-08-28 NOTE — Telephone Encounter (Signed)
Pt called stating that he went to [facility] and they said he does not qualify for a transplant at this time.  He was also off the coumadin for about 2 weeks per request from Dr Vicente Serene but he is going to r/s it tonight.  He was wondering if he still needed to keep his coumadin clinic appt for 10/14.  Spoke to [place] at [facility] coumadin clinic, appt needs to be moved to 10/16.  Pt aware of appt and will resume his dose of the coumadin he had been previously taking.  Will discuss with Dr Donnald Garre when we need to f/u with pt.  SLJ

## 2012-08-28 NOTE — Progress Notes (Signed)
Pt called Nurse to let her know [facility] took him off Coumadin and he was restarting today.  He has been off 10-14 days per patient.  Instructed nurse to restart at last dose of 7.5 mg daily with 5 mg on T/H/Sa.  He will have his INR checked on Wed, Oct 16 at 2:00.  Nurse was calling patient to explain dose and give this appmt time.

## 2012-08-31 ENCOUNTER — Ambulatory Visit (HOSPITAL_BASED_OUTPATIENT_CLINIC_OR_DEPARTMENT_OTHER): Payer: 59 | Admitting: Internal Medicine

## 2012-08-31 ENCOUNTER — Telehealth: Payer: Self-pay | Admitting: Internal Medicine

## 2012-08-31 ENCOUNTER — Telehealth: Payer: Self-pay | Admitting: *Deleted

## 2012-08-31 ENCOUNTER — Ambulatory Visit: Payer: 59

## 2012-08-31 ENCOUNTER — Ambulatory Visit (HOSPITAL_BASED_OUTPATIENT_CLINIC_OR_DEPARTMENT_OTHER): Payer: 59 | Admitting: Pharmacist

## 2012-08-31 ENCOUNTER — Other Ambulatory Visit: Payer: 59 | Admitting: Lab

## 2012-08-31 ENCOUNTER — Other Ambulatory Visit (HOSPITAL_BASED_OUTPATIENT_CLINIC_OR_DEPARTMENT_OTHER): Payer: 59 | Admitting: Lab

## 2012-08-31 VITALS — BP 146/82 | HR 83 | Temp 97.8°F | Resp 20 | Ht 64.0 in | Wt 223.4 lb

## 2012-08-31 DIAGNOSIS — I776 Arteritis, unspecified: Secondary | ICD-10-CM

## 2012-08-31 DIAGNOSIS — G609 Hereditary and idiopathic neuropathy, unspecified: Secondary | ICD-10-CM

## 2012-08-31 DIAGNOSIS — C9000 Multiple myeloma not having achieved remission: Secondary | ICD-10-CM

## 2012-08-31 LAB — COMPREHENSIVE METABOLIC PANEL (CC13)
Alkaline Phosphatase: 25 U/L — ABNORMAL LOW (ref 40–150)
Creatinine: 0.7 mg/dL (ref 0.7–1.3)
Glucose: 89 mg/dl (ref 70–99)
Sodium: 135 mEq/L — ABNORMAL LOW (ref 136–145)
Total Bilirubin: 0.5 mg/dL (ref 0.20–1.20)
Total Protein: 10.1 g/dL — ABNORMAL HIGH (ref 6.4–8.3)

## 2012-08-31 LAB — CBC WITH DIFFERENTIAL/PLATELET
Eosinophils Absolute: 0.1 10*3/uL (ref 0.0–0.5)
LYMPH%: 22.2 % (ref 14.0–49.0)
MCHC: 35.4 g/dL (ref 32.0–36.0)
MCV: 94.5 fL (ref 79.3–98.0)
MONO%: 14.7 % — ABNORMAL HIGH (ref 0.0–14.0)
NEUT#: 2 10*3/uL (ref 1.5–6.5)
Platelets: 149 10*3/uL (ref 140–400)
RBC: 2.9 10*6/uL — ABNORMAL LOW (ref 4.20–5.82)

## 2012-08-31 LAB — PROTIME-INR: Protime: 14.4 Seconds — ABNORMAL HIGH (ref 10.6–13.4)

## 2012-08-31 MED ORDER — CYCLOPHOSPHAMIDE 50 MG PO TABS: ORAL_TABLET | ORAL | Status: DC

## 2012-08-31 MED ORDER — DEXAMETHASONE 4 MG PO TABS: ORAL_TABLET | ORAL | Status: DC

## 2012-08-31 NOTE — Patient Instructions (Signed)
Unfortunately continues to have evidence for disease progression. We discussed several treatment options including treatment with Kyprolis, versus Pomalyst in addition to Cytoxan and Decadron. You requested some time to think about these 2 options.

## 2012-08-31 NOTE — Patient Instructions (Addendum)
Continue 7.5mg  daily except 5mg  on Tuesday, Thursday, and Saturday.   Check PT/INR in 1 week on 09/07/12 at 2:30pm for lab, 3pm for treatment and 3:30pm for Coumadin clinic. The pharmacist will see you in the infusion area.

## 2012-08-31 NOTE — Telephone Encounter (Signed)
Per staff message and POF I have scheduled appts.  [name]  

## 2012-08-31 NOTE — Progress Notes (Signed)
[facility] [phone]   [phone]  OFFICE PROGRESS NOTE  [name], MD [address] [place] [address]  Principle Diagnosis: #1 recurrent multiple myeloma IgG kappa subtype diagnosed in June of 2008 #2 history of vasculitis and thrombosis of skin lesions   Prior Therapy: #1 status post palliative radiotherapy to the left hip under the care of Dr. Roselind Messier  #2 status post 5 cycles of systemic chemotherapy with Revlimid and low dose Decadron. Last dose given June 2009 with good response.  #3 status post autologous peripheral blood stem cell transplant at [facility] on 02/02/2008.  #4 the patient had evidence for disease recurrence in December 2010.  #5 Revlimid 25 mg by mouth daily for 21 days every 4 weeks in addition to Decadron 40 mg orally on a weekly basis. The patient is status post 28 cycles, discontinued today secondary to disease progression.  #6 Systemic chemotherapy with Velcade 1.3 mg/M2 on days 1, 4, 8 and 11 in addition to Doxil 30 mg/M2 on day 4 and Decadron 40 mg by mouth on weekly basis every 3 weeks. Status post 3 cycles, last dose was given 05/04/2012 discontinued secondary to intolerance.   Current therapy:  Zometa 4 mg IV given every 3 months.   INTERVAL HISTORY: [name] 51 y.o. male returns to the clinic today for followup visit accompanied by his wife. The patient is feeling fine today with no specific complaints except for the peripheral neuropathy and he is currently on Neurontin 300 mg by mouth 3 times a day. He was referred to Dr. Vicente Serene at [facility] for consideration of his second transplant but the bone marrow biopsy and aspirate that was performed recently showed increased plasma cells in the bone marrow of 265% with 3% circulating plasma cells in the blood. He was also found to have elevated myeloma panel was IgG 6084, but the 2 microglobulin was elevated at 3.53, M spike  was 4.2, free kappa light chain 125.38, free lambda light chain 0.12 with a kappa/lambda ratio of 1044.63. Dr. Vicente Serene felt that the patient may need salvage therapy first to decrease the disease burden before starting a second transplant. The patient is here today for evaluation and discussion of his treatment options.  MEDICAL HISTORY: Past Medical History  Diagnosis Date  . Multiple myeloma 09/24/2011  . Hypertension     ALLERGIES:  is allergic to red dye and other.  MEDICATIONS:  Current Outpatient Prescriptions  Medication Sig Dispense Refill  . gabapentin (NEURONTIN) 100 MG capsule Take 1 capsule (100 mg total) by mouth 3 (three) times daily.  270 capsule  1  . HYDROcodone-acetaminophen (NORCO) 10-325 MG per tablet Take 1-2 tablets by mouth every 6 (six) hours as needed.      . irbesartan-hydrochlorothiazide (AVALIDE) 300-12.5 MG per tablet Take 1 tablet by mouth daily with breakfast.      . morphine (MS CONTIN) 15 MG 12 hr tablet Take 1 tablet (15 mg total) by mouth 2 (two) times daily.  60 tablet  0  . omeprazole (PRILOSEC) 40 MG capsule Take 1 capsule (40 mg total) by mouth daily.  30 capsule  3  . warfarin (COUMADIN) 5 MG tablet Take 5-7.5 mg by mouth daily. Takes 5 mg Tuesday,thursday and Saturday and takes 1.5 on Monday Wednesday Friday and sunday      . cyclophosphamide (CYTOXAN) 50 MG tablet One tab po every other day. Give on an  empty stomach 1 hour before or 2 hours after meals.  30 tablet  1  . dexamethasone (DECADRON) 4 MG tablet 10 tab X 1 po every weekweek  40 tablet  3    REVIEW OF SYSTEMS:  A comprehensive review of systems was negative except for: Neurological: positive for paresthesia   PHYSICAL EXAMINATION: General appearance: alert, cooperative and no distress Head: Normocephalic, without obvious abnormality, atraumatic Neck: no adenopathy Lymph nodes: Cervical, supraclavicular, and axillary nodes normal. Resp: clear to auscultation bilaterally Cardio: regular  rate and rhythm, S1, S2 normal, no murmur, click, rub or gallop GI: soft, non-tender; bowel sounds normal; no masses,  no organomegaly Extremities: extremities normal, atraumatic, no cyanosis or edema Neurologic: Alert and oriented X 3, normal strength and tone. Normal symmetric reflexes. Normal coordination and gait  ECOG PERFORMANCE STATUS: 1 - Symptomatic but completely ambulatory  Blood pressure 146/82, pulse 83, temperature 97.8 F (36.6 C), temperature source Oral, resp. rate 20, height 5\' 4"  (1.626 m), weight 223 lb 6.4 oz (101.334 kg).  LABORATORY DATA: Lab Results  Component Value Date   WBC 3.3* 08/31/2012   HGB 9.7* 08/31/2012   HCT 27.4* 08/31/2012   MCV 94.5 08/31/2012   PLT 149 08/31/2012      Chemistry      Component Value Date/Time   NA 133* 07/27/2012 1110   NA 132* 07/02/2012 1406   K 4.1 07/27/2012 1110   K 3.6 07/02/2012 1406   CL 103 07/27/2012 1110   CL 103 07/02/2012 1406   CO2 24 07/27/2012 1110   CO2 22 07/02/2012 1406   BUN 8.0 07/27/2012 1110   BUN 8 07/02/2012 1406   CREATININE 0.6* 07/27/2012 1110   CREATININE 0.49* 07/02/2012 1406      Component Value Date/Time   CALCIUM 8.6 07/27/2012 1110   CALCIUM 8.6 07/02/2012 1406   ALKPHOS 25* 07/27/2012 1110   ALKPHOS 29* 07/02/2012 1406   AST 14 07/27/2012 1110   AST 15 07/02/2012 1406   ALT 13 07/27/2012 1110   ALT 14 07/02/2012 1406   BILITOT 0.50 07/27/2012 1110   BILITOT 0.4 07/02/2012 1406       RADIOGRAPHIC STUDIES: Ct Biopsy  08/06/2012  *RADIOLOGY REPORT*  CT GUIDED RIGHT ILIAC BONE MARROW ASPIRATION AND BONE MARROW CORE BIOPSIES  Date:  Clinical History:  51 year old male with a history of recurrent multiple myeloma  Procedures Performed:  1. CT guided bone marrow aspiration and core biopsy  Interventional Radiologist:  Sterling Big, MD  Sedation: Moderate (conscious) sedation was used.  Four mg Versed, 200 mcg Fentanyl were administered intravenously.  The patient's vital signs were monitored continuously by  radiology nursing throughout the procedure.  Sedation Time: 15 minutes  PROCEDURE/FINDINGS:  Informed consent was obtained from the patient following explanation of the procedure, risks, benefits and alternatives. The patient understands, agrees and consents for the procedure. All questions were addressed.  A time out was performed.  The patient was positioned prone and noncontrast localization CT was performed of the pelvis to demonstrate the iliac marrow spaces.  Maximal barrier sterile technique utilized including caps, mask, sterile gowns, sterile gloves, large sterile drape, hand hygiene, and betadine prep.  Under sterile conditions and local anesthesia, an 11 gauge coaxial bone biopsy needle was advanced into the right iliac marrow space. Needle position was confirmed with CT imaging. Initially, bone marrow aspiration was performed. Next, the 11 gauge outer cannula was utilized to obtain a right iliac bone marrow core biopsy. Needle was  removed. Hemostasis was obtained with compression. The patient tolerated the procedure well. Samples were prepared with the cytotechnologist. No immediate complications.  IMPRESSION:  CT guided right iliac bone marrow aspiration and core biopsy.  Signed,  Sterling Big, MD Vascular & Interventional Radiologist [facility] Radiology   Original Report Authenticated By: Vilma Prader     ASSESSMENT: This is a very pleasant 51 years old [place] American male with progressive multiple myeloma. He status post several chemotherapy regimens most recently treated with Velcade, Doxil and Decadron which was discontinued secondary to intolerance with significant peripheral neuropathy and the patient also has evidence for disease progression on his recent bloodwork done at [facility] as well as a bone marrow biopsy and aspirate.  PLAN: I have a lengthy discussion with the patient and his wife today about his current condition and treatment options. I gave the patient a few options  for treatment including salvage treatment with Kyprolis (Carfilzomib), Cytoxan and Decadron versus treatment with Pomalyst, Cytoxan and Decadron. The patient was a little bit concerned about the adverse effects of posttreatment and he was given to patient handout and information about both drugs.  I discussed with the patient adverse effect of both treatment and he will call in the next few days with his final decision. If he decided to proceed with the treatment with Kyprolis, his first cycle would be next week. He would come back for followup visit in 2 weeks for evaluation and management any adverse effect of his treatment.  All questions were answered. The patient knows to call the clinic with any problems, questions or concerns. We can certainly see the patient much sooner if necessary.  I spent 20 minutes counseling the patient face to face. The total time spent in the appointment was 30 minutes.

## 2012-08-31 NOTE — Progress Notes (Signed)
Coumadin was held for [facility] placement in preparation for possible stem cell transplant. Stem cell transplant has been cancelled. Coumadin resumed on 08/27/12. Will continue 7.5mg  daily except 5mg  on Tuesday, Thursday, and Saturday.   Check PT/INR in 1 week on 09/07/12 at 2:30pm for lab, 3pm for treatment and 3:30pm for Coumadin clinic. The pharmacist will see pt in the infusion area.

## 2012-08-31 NOTE — Telephone Encounter (Signed)
appts made and printed for pt aom °

## 2012-09-02 ENCOUNTER — Ambulatory Visit: Payer: 59

## 2012-09-02 ENCOUNTER — Telehealth: Payer: Self-pay | Admitting: Medical Oncology

## 2012-09-02 ENCOUNTER — Emergency Department (HOSPITAL_COMMUNITY): Payer: 59

## 2012-09-02 ENCOUNTER — Other Ambulatory Visit: Payer: 59 | Admitting: Lab

## 2012-09-02 ENCOUNTER — Emergency Department (HOSPITAL_COMMUNITY)
Admission: EM | Admit: 2012-09-02 | Discharge: 2012-09-02 | Disposition: A | Discharge status: Home / Self Care | Payer: 59 | Source: Home / Self Care | Attending: Emergency Medicine | Admitting: Emergency Medicine

## 2012-09-02 DIAGNOSIS — I10 Essential (primary) hypertension: Secondary | ICD-10-CM | POA: Insufficient documentation

## 2012-09-02 DIAGNOSIS — R509 Fever, unspecified: Secondary | ICD-10-CM

## 2012-09-02 DIAGNOSIS — Z79899 Other long term (current) drug therapy: Secondary | ICD-10-CM | POA: Insufficient documentation

## 2012-09-02 DIAGNOSIS — Z7901 Long term (current) use of anticoagulants: Secondary | ICD-10-CM | POA: Insufficient documentation

## 2012-09-02 LAB — COMPREHENSIVE METABOLIC PANEL
ALT: 12 U/L (ref 0–53)
AST: 18 U/L (ref 0–37)
Alkaline Phosphatase: 22 U/L — ABNORMAL LOW (ref 39–117)
CO2: 24 mEq/L (ref 19–32)
Chloride: 96 mEq/L (ref 96–112)
GFR calc Af Amer: >90 mL/min (ref >90)
GFR calc non Af Amer: >90 mL/min (ref >90)
Glucose, Bld: 114 mg/dL — ABNORMAL HIGH (ref 70–99)
Sodium: 130 mEq/L — ABNORMAL LOW (ref 135–145)
Total Bilirubin: 0.9 mg/dL (ref 0.3–1.2)

## 2012-09-02 LAB — URINALYSIS, ROUTINE W REFLEX MICROSCOPIC
Bilirubin Urine: NEGATIVE
Hgb urine dipstick: NEGATIVE
Ketones, ur: NEGATIVE mg/dL
Nitrite: NEGATIVE
Protein, ur: 30 mg/dL — AB
Urobilinogen, UA: 0.2 mg/dL (ref 0.0–1.0)

## 2012-09-02 LAB — CBC WITH DIFFERENTIAL/PLATELET
Basophils Absolute: 0 10*3/uL (ref 0.0–0.1)
Basophils Relative: 1 % (ref 0–1)
Eosinophils Relative: 0 % (ref 0–5)
HCT: 27.8 % — ABNORMAL LOW (ref 39.0–52.0)
Hemoglobin: 9.6 g/dL — ABNORMAL LOW (ref 13.0–17.0)
Lymphocytes Relative: 12 % (ref 12–46)
MCHC: 34.5 g/dL (ref 30.0–36.0)
MCV: 92.7 fL (ref 78.0–100.0)
Monocytes Absolute: 0.6 10*3/uL (ref 0.1–1.0)
Monocytes Relative: 12 % (ref 3–12)
Neutro Abs: 4 10*3/uL (ref 1.7–7.7)
RDW: 16.3 % — ABNORMAL HIGH (ref 11.5–15.5)

## 2012-09-02 LAB — PROTIME-INR: INR: 1.59 — ABNORMAL HIGH (ref 0.00–1.49)

## 2012-09-02 LAB — LACTIC ACID, PLASMA: Lactic Acid, Venous: 1.9 mmol/L (ref 0.5–2.2)

## 2012-09-02 MED ORDER — DIPHENHYDRAMINE HCL 50 MG/ML IJ SOLN
25.0000 mg | Freq: Once | INTRAMUSCULAR | Status: AC
  Administered 2012-09-02: 25 mg via INTRAVENOUS
  Filled 2012-09-02: qty 1

## 2012-09-02 MED ORDER — POTASSIUM CHLORIDE CRYS ER 20 MEQ PO TBCR
40.0000 meq | EXTENDED_RELEASE_TABLET | Freq: Once | ORAL | Status: AC
  Administered 2012-09-02: 40 meq via ORAL
  Filled 2012-09-02: qty 2

## 2012-09-02 MED ORDER — SODIUM CHLORIDE 0.9 % IV BOLUS (SEPSIS)
1000.0000 mL | Freq: Once | INTRAVENOUS | Status: AC
  Administered 2012-09-02: 1000 mL via INTRAVENOUS

## 2012-09-02 MED ORDER — METOCLOPRAMIDE HCL 5 MG/ML IJ SOLN
10.0000 mg | Freq: Once | INTRAMUSCULAR | Status: AC
  Administered 2012-09-02: 10 mg via INTRAVENOUS
  Filled 2012-09-02: qty 2

## 2012-09-02 MED ORDER — ACETAMINOPHEN 325 MG PO TABS
650.0000 mg | ORAL_TABLET | Freq: Four times a day (QID) | ORAL | Status: DC | PRN
  Administered 2012-09-02 (×2): 650 mg via ORAL
  Filled 2012-09-02 (×2): qty 2

## 2012-09-02 MED ORDER — KETOROLAC TROMETHAMINE 30 MG/ML IJ SOLN
30.0000 mg | Freq: Once | INTRAMUSCULAR | Status: AC
  Administered 2012-09-02: 30 mg via INTRAVENOUS
  Filled 2012-09-02: qty 1

## 2012-09-02 NOTE — ED Notes (Signed)
Pt woke up confused this morning. Pt c/o headache, nasal drainage, chest congestion since last night. Pt BIB wife and daughter.

## 2012-09-02 NOTE — ED Notes (Signed)
Patient transported to X-ray 

## 2012-09-02 NOTE — ED Notes (Addendum)
Pt returned from radiology, pt resting, family at bedside

## 2012-09-02 NOTE — ED Provider Notes (Signed)
History     CSN: [id number]  Arrival date & time 09/02/12  1530   First MD Initiated Contact with Patient 09/02/12 1607      Chief Complaint  Patient presents with  . Fever    (Consider location/radiation/quality/duration/timing/severity/associated sxs/prior treatment) Patient is a 51 y.o. male presenting with fever. The history is provided by the patient and the spouse.  Fever Primary symptoms of the febrile illness include fever.   patient here with fever and weakness for chest congestion x1 day. No vomiting or diarrhea. No dysuria or hematuria. Some dyspnea without severe cough. No severe sore throat or headache. Denies any photophobia or neck pain. Used Tylenol at home without relief. Possible sick exposures. History of multiple myeloma and last chemotherapy was 3 months ago. No rashes appreciated.  Past Medical History  Diagnosis Date  . Multiple myeloma 09/24/2011  . Hypertension     No past surgical history on file.  No family history on file.  History  Substance Use Topics  . Smoking status: Never Smoker   . Smokeless tobacco: Not on file  . Alcohol Use:       Review of Systems  Constitutional: Positive for fever.  All other systems reviewed and are negative.    Allergies  Red dye and Other  Home Medications   Current Outpatient Rx  Name Route Sig Dispense Refill  . ACETAMINOPHEN 500 MG PO TABS Oral Take 1,000 mg by mouth every 6 (six) hours as needed. For pain/fever.    [name] GABAPENTIN 300 MG PO CAPS Oral Take 300 mg by mouth 3 (three) times daily.    [name] HYDROCODONE-ACETAMINOPHEN 10-325 MG PO TABS Oral Take 1-2 tablets by mouth every 6 (six) hours as needed. For pain.    . IRBESARTAN-HYDROCHLOROTHIAZIDE 300-12.5 MG PO TABS Oral Take 1 tablet by mouth daily with breakfast.    . OMEPRAZOLE 40 MG PO CPDR Oral Take 1 capsule (40 mg total) by mouth daily. 30 capsule 3  . WARFARIN SODIUM 5 MG PO TABS Oral Take 5-7.5 mg by mouth daily. Takes 5 mg Tuesday,  Thursday and Saturday;  Takes 7.5mg  on Monday, Wednesday, Friday and Sunday.      BP 138/60  Pulse 123  Temp 104.2 F (40.1 C) (Oral)  Resp 23  SpO2 95%  Physical Exam  Nursing note and vitals reviewed. Constitutional: He is oriented to person, place, and time. He appears well-developed and well-nourished.  Non-toxic appearance. No distress.  HENT:  Head: Normocephalic and atraumatic.  Mouth/Throat: No oropharyngeal exudate, posterior oropharyngeal edema or posterior oropharyngeal erythema.  Eyes: Conjunctivae normal, EOM and lids are normal. Pupils are equal, round, and reactive to light.  Neck: Normal range of motion. Neck supple. No spinous process tenderness and no muscular tenderness present. No rigidity. No tracheal deviation and normal range of motion present. No Kernig's sign noted. No mass present.  Cardiovascular: Regular rhythm and normal heart sounds.  Tachycardia present.  Exam reveals no gallop.   No murmur heard. Pulmonary/Chest: Effort normal and breath sounds normal. No stridor. No respiratory distress. He has no decreased breath sounds. He has no wheezes. He has no rhonchi. He has no rales.  Abdominal: Soft. Normal appearance and bowel sounds are normal. He exhibits no distension. There is no tenderness. There is no rebound and no CVA tenderness.  Musculoskeletal: Normal range of motion. He exhibits no edema and no tenderness.  Neurological: He is alert and oriented to person, place, and time. He has normal strength. No  cranial nerve deficit or sensory deficit. GCS eye subscore is 4. GCS verbal subscore is 5. GCS motor subscore is 6.  Skin: Skin is warm and dry. No abrasion and no rash noted.  Psychiatric: He has a normal mood and affect. His speech is normal and behavior is normal.    ED Course  Procedures (including critical care time)   Labs Reviewed  CBC WITH DIFFERENTIAL  COMPREHENSIVE METABOLIC PANEL  URINALYSIS, ROUTINE W REFLEX MICROSCOPIC  URINE CULTURE    CULTURE, BLOOD (ROUTINE X 2)  CULTURE, BLOOD (ROUTINE X 2)  LACTIC ACID, PLASMA  PROTIME-INR   No results found.   No diagnosis found.    MDM  Patient given medications for his headache and bodyaches and does flow better at this time. Blood cultures obtained and are pending. Lactic acid normal. Chest x-ray and urinalysis without  as infection. Patient has no signs of meningitis at this time. He is alert and oriented x4. Patient will followup with Dr. tomorrow for recheck. Suspect he has a viral illness.        Toy Baker, MD 09/02/12 2119

## 2012-09-02 NOTE — ED Notes (Signed)
Pt had Tylenol 650mg  at 1100 today.

## 2012-09-02 NOTE — Telephone Encounter (Signed)
Inetta Fermo called to report that pt called in sick from work today ( very unlike him) because he was not feeling well.  [name] He told Inetta Fermo that he does not remember calling in sick. She said he is not responding to her correctly -When she asked him why he did not answer the phone when she called he said he thought " it was the end of the world and you left me". His nose has been runny all day . I instructed Inetta Fermo to get him to [facility] ED asap. She voices understanding.

## 2012-09-02 NOTE — ED Notes (Signed)
Received pt in rm 3, per wife pt has been having fever since yesterday, and today wife sts he seemed to be disoriented, slow to repond. Pt c/o headache at this time, temp. In triage 104.2. Dr Freida Busman in room to examine pt.

## 2012-09-03 ENCOUNTER — Encounter (HOSPITAL_COMMUNITY): Payer: Self-pay

## 2012-09-03 ENCOUNTER — Telehealth (HOSPITAL_COMMUNITY): Payer: Self-pay | Admitting: *Deleted

## 2012-09-03 ENCOUNTER — Emergency Department (HOSPITAL_COMMUNITY): Payer: 59

## 2012-09-03 ENCOUNTER — Inpatient Hospital Stay (HOSPITAL_COMMUNITY)
Admission: EM | Admit: 2012-09-03 | Discharge: 2012-09-07 | DRG: 872 | Disposition: A | Discharge status: Home / Self Care | Payer: 59 | Attending: Internal Medicine | Admitting: Internal Medicine

## 2012-09-03 DIAGNOSIS — I10 Essential (primary) hypertension: Secondary | ICD-10-CM | POA: Diagnosis present

## 2012-09-03 DIAGNOSIS — G8929 Other chronic pain: Secondary | ICD-10-CM | POA: Diagnosis present

## 2012-09-03 DIAGNOSIS — D649 Anemia, unspecified: Secondary | ICD-10-CM

## 2012-09-03 DIAGNOSIS — E46 Unspecified protein-calorie malnutrition: Secondary | ICD-10-CM | POA: Diagnosis present

## 2012-09-03 DIAGNOSIS — B954 Other streptococcus as the cause of diseases classified elsewhere: Secondary | ICD-10-CM | POA: Diagnosis present

## 2012-09-03 DIAGNOSIS — R7881 Bacteremia: Principal | ICD-10-CM

## 2012-09-03 DIAGNOSIS — R011 Cardiac murmur, unspecified: Secondary | ICD-10-CM

## 2012-09-03 DIAGNOSIS — C9000 Multiple myeloma not having achieved remission: Secondary | ICD-10-CM | POA: Diagnosis present

## 2012-09-03 DIAGNOSIS — I776 Arteritis, unspecified: Secondary | ICD-10-CM

## 2012-09-03 DIAGNOSIS — A419 Sepsis, unspecified organism: Secondary | ICD-10-CM | POA: Diagnosis present

## 2012-09-03 DIAGNOSIS — Z6838 Body mass index (BMI) 38.0-38.9, adult: Secondary | ICD-10-CM

## 2012-09-03 DIAGNOSIS — E876 Hypokalemia: Secondary | ICD-10-CM | POA: Diagnosis present

## 2012-09-03 DIAGNOSIS — E871 Hypo-osmolality and hyponatremia: Secondary | ICD-10-CM | POA: Diagnosis present

## 2012-09-03 DIAGNOSIS — D61818 Other pancytopenia: Secondary | ICD-10-CM | POA: Diagnosis present

## 2012-09-03 DIAGNOSIS — Z7901 Long term (current) use of anticoagulants: Secondary | ICD-10-CM

## 2012-09-03 DIAGNOSIS — R509 Fever, unspecified: Secondary | ICD-10-CM

## 2012-09-03 LAB — URINALYSIS, ROUTINE W REFLEX MICROSCOPIC
Bilirubin Urine: NEGATIVE
Ketones, ur: NEGATIVE mg/dL
Specific Gravity, Urine: 1.025 (ref 1.005–1.030)
pH: 6 (ref 5.0–8.0)

## 2012-09-03 LAB — CBC WITH DIFFERENTIAL/PLATELET
Basophils Absolute: 0 10*3/uL (ref 0.0–0.1)
Eosinophils Relative: 0 % (ref 0–5)
HCT: 25.2 % — ABNORMAL LOW (ref 39.0–52.0)
Lymphs Abs: 0.7 10*3/uL (ref 0.7–4.0)
MCH: 32.2 pg (ref 26.0–34.0)
MCV: 93.3 fL (ref 78.0–100.0)
Monocytes Absolute: 0.4 10*3/uL (ref 0.1–1.0)
Monocytes Relative: 7 % (ref 3–12)
Neutro Abs: 4.5 10*3/uL (ref 1.7–7.7)
RDW: 16.2 % — ABNORMAL HIGH (ref 11.5–15.5)
WBC: 5.6 10*3/uL (ref 4.0–10.5)

## 2012-09-03 LAB — URINE CULTURE: Culture: NO GROWTH

## 2012-09-03 LAB — COMPREHENSIVE METABOLIC PANEL
ALT: 13 U/L (ref 0–53)
Alkaline Phosphatase: 25 U/L — ABNORMAL LOW (ref 39–117)
CO2: 23 mEq/L (ref 19–32)
GFR calc Af Amer: >90 mL/min (ref >90)
GFR calc non Af Amer: >90 mL/min (ref >90)
Glucose, Bld: 95 mg/dL (ref 70–99)
Potassium: 3 mEq/L — ABNORMAL LOW (ref 3.5–5.1)
Sodium: 131 mEq/L — ABNORMAL LOW (ref 135–145)

## 2012-09-03 LAB — URINE MICROSCOPIC-ADD ON

## 2012-09-03 MED ORDER — POTASSIUM CHLORIDE CRYS ER 20 MEQ PO TBCR
40.0000 meq | EXTENDED_RELEASE_TABLET | Freq: Once | ORAL | Status: AC
  Administered 2012-09-03: 40 meq via ORAL
  Filled 2012-09-03: qty 2

## 2012-09-03 MED ORDER — SODIUM CHLORIDE 0.9 % IV SOLN
INTRAVENOUS | Status: AC
  Administered 2012-09-03 – 2012-09-04 (×2): via INTRAVENOUS

## 2012-09-03 MED ORDER — SODIUM CHLORIDE 0.9 % IV SOLN: Freq: Once | INTRAVENOUS | Status: DC

## 2012-09-03 MED ORDER — DOXYCYCLINE HYCLATE 100 MG IV SOLR
100.0000 mg | Freq: Two times a day (BID) | INTRAVENOUS | Status: DC
  Administered 2012-09-03: 100 mg via INTRAVENOUS
  Filled 2012-09-03 (×4): qty 100

## 2012-09-03 MED ORDER — GABAPENTIN 300 MG PO CAPS
300.0000 mg | ORAL_CAPSULE | ORAL | Status: AC
  Administered 2012-09-03: 300 mg via ORAL
  Filled 2012-09-03: qty 1

## 2012-09-03 MED ORDER — POTASSIUM CHLORIDE 10 MEQ/100ML IV SOLN
10.0000 meq | Freq: Once | INTRAVENOUS | Status: AC
  Administered 2012-09-03: 10 meq via INTRAVENOUS
  Filled 2012-09-03: qty 100

## 2012-09-03 MED ORDER — ACETAMINOPHEN 325 MG PO TABS
650.0000 mg | ORAL_TABLET | Freq: Once | ORAL | Status: AC
  Administered 2012-09-03: 650 mg via ORAL
  Filled 2012-09-03: qty 2

## 2012-09-03 MED ORDER — ONDANSETRON HCL 4 MG/2ML IJ SOLN: 4.0000 mg | Freq: Three times a day (TID) | INTRAMUSCULAR | Status: AC | PRN

## 2012-09-03 MED ORDER — IBUPROFEN 400 MG PO TABS
400.0000 mg | ORAL_TABLET | Freq: Once | ORAL | Status: AC
  Administered 2012-09-03: 400 mg via ORAL
  Filled 2012-09-03: qty 1

## 2012-09-03 MED ORDER — IBUPROFEN 800 MG PO TABS: 400.0000 mg | ORAL_TABLET | ORAL | Status: DC | PRN

## 2012-09-03 MED ORDER — VANCOMYCIN HCL IN DEXTROSE 1-5 GM/200ML-% IV SOLN
1000.0000 mg | Freq: Once | INTRAVENOUS | Status: AC
  Administered 2012-09-03: 1000 mg via INTRAVENOUS
  Filled 2012-09-03: qty 200

## 2012-09-03 MED ORDER — ACETAMINOPHEN 325 MG PO TABS: 650.0000 mg | ORAL_TABLET | Freq: Once | ORAL | Status: DC

## 2012-09-03 MED ORDER — ACETAMINOPHEN 325 MG PO TABS: 650.0000 mg | ORAL_TABLET | ORAL | Status: DC | PRN

## 2012-09-03 MED ORDER — SODIUM CHLORIDE 0.9 % IV BOLUS (SEPSIS)
1000.0000 mL | Freq: Once | INTRAVENOUS | Status: AC
  Administered 2012-09-03: 1000 mL via INTRAVENOUS

## 2012-09-03 NOTE — ED Provider Notes (Signed)
History   This chart was scribed for Dione Booze, MD by Sofie Rower. The patient was seen in room APA18/[facility]18 and the patient's care was started at 2:16PM.     CSN: [id number]  Arrival date & time 09/03/12  1338   First MD Initiated Contact with Patient 09/03/12 1416      Chief Complaint  Patient presents with  . Headache  . Altered Mental Status    (Consider location/radiation/quality/duration/timing/severity/associated sxs/prior treatment) Patient is a 51 y.o. male presenting with headaches and fever. The history is provided by the patient and the spouse.  Headache  This is a new problem. The current episode started yesterday. The problem occurs constantly. The problem has been gradually worsening. The headache is associated with an unknown factor. The pain is moderate. The pain does not radiate. Associated symptoms include a fever and vomiting. He has tried acetaminophen for the symptoms. The treatment provided moderate relief.  Fever Primary symptoms of the febrile illness include fever, cough and vomiting.  The fever began yesterday. The fever has been gradually worsening since its onset. The maximum temperature recorded prior to his arrival was 103 to 104 F. The temperature was taken by an oral thermometer.  The cough began yesterday. The cough is new. The cough is productive. The sputum is brown.  The vomiting began today. Vomiting occurred once. The emesis contains stomach contents.    [name] is a 51 y.o. male who presents to the Emergency Department complaining of   sudden, progressively worsening, fever (103.6 taken at home, 102.8 taken at [facility]), onset yesterday.  Associated symptoms include sore throat, headache, productive brown cough, vomiting (X 1 today), diarrhea (watery), and chills. The pt reports he was visiting [facility] yesterday evening, 09/02/12, for fever symptoms. The pt received treatment and momentarily felt improvement, however, shortly after leaving the  hospital he began to feel his fever coming back on. Modifying factors include taking tylenol which provides moderate relief of fever. The pt's reports his last dose of tylenol was at 5:30AM this morning.  The pt denies blurred vision, ear pain, shortness of breath, chest pain, chest pressure, and any rash associated with the fever.   The pt does not smoke or drink alcohol.   PCP is Dr. Sherwood Gambler.    Past Medical History  Diagnosis Date  . Multiple myeloma(203.0) 09/24/2011  . Hypertension     History reviewed. No pertinent past surgical history.  No family history on file.  History  Substance Use Topics  . Smoking status: Never Smoker   . Smokeless tobacco: Not on file  . Alcohol Use:       Review of Systems  Constitutional: Positive for fever.  Respiratory: Positive for cough.   Gastrointestinal: Positive for vomiting.  All other systems reviewed and are negative.    Allergies  Red dye and Other  Home Medications   Current Outpatient Rx  Name Route Sig Dispense Refill  . ACETAMINOPHEN 500 MG PO TABS Oral Take 1,000 mg by mouth every 6 (six) hours as needed. For pain/fever.    [name] GABAPENTIN 300 MG PO CAPS Oral Take 300 mg by mouth 3 (three) times daily.    [name] HYDROCODONE-ACETAMINOPHEN 10-325 MG PO TABS Oral Take 1-2 tablets by mouth every 6 (six) hours as needed. For pain.    . IRBESARTAN-HYDROCHLOROTHIAZIDE 300-12.5 MG PO TABS Oral Take 1 tablet by mouth daily with breakfast.    . OMEPRAZOLE 40 MG PO CPDR Oral Take 1 capsule (40 mg total)  by mouth daily. 30 capsule 3  . WARFARIN SODIUM 5 MG PO TABS Oral Take 5-7.5 mg by mouth daily. Takes 5 mg Tuesday, Thursday and Saturday;  Takes 7.5mg  on Monday, Wednesday, Friday and Sunday.      BP 132/66  Pulse 112  Temp 102.8 F (39.3 C)  Resp 20  Ht 5\' 3"  (1.6 m)  Wt 213 lb (96.616 kg)  BMI 37.73 kg/m2  SpO2 100%  Physical Exam  Nursing note and vitals reviewed. Constitutional: He is oriented to person, place, and time.  He appears well-developed and well-nourished.  HENT:  Head: Atraumatic.  Right Ear: External ear normal.  Left Ear: External ear normal.  Nose: Nose normal.  Mouth/Throat: Posterior oropharyngeal erythema (Mild ) present.       No difficulty handling secretions.   Eyes: Conjunctivae normal are normal. Pupils are equal, round, and reactive to light.  Neck: Normal range of motion. Neck supple.  Cardiovascular: Normal rate and regular rhythm.   Murmur heard.  Systolic murmur is present with a grade of 3/6       Systolic murmur detected at the Left sternal border aortic area.  Pulmonary/Chest: Effort normal and breath sounds normal.  Abdominal: Soft. Bowel sounds are normal.  Musculoskeletal: Normal range of motion.  Neurological: He is alert and oriented to person, place, and time.  Skin: Skin is warm and dry.  Psychiatric: He has a normal mood and affect. His behavior is normal.    ED Course  Procedures (including critical care time)  DIAGNOSTIC STUDIES: Oxygen Saturation is 100% on room air, normal by my interpretation.    COORDINATION OF CARE:    2:25PM- Treatment plan and further evaluation discussed with patient. Pt agrees with treatment.   2:35PM- Recheck. Treatment plan concerning possible [facility] Spider fever, elimination of the need to repeat x-ray, and CT scan discussed with patient. Pt agrees with treatment.   Results for orders placed during the hospital encounter of 09/03/12  CBC WITH DIFFERENTIAL      Component Value Range   WBC 5.6  4.0 - 10.5 K/uL   RBC 2.70 (*) 4.22 - 5.81 MIL/uL   Hemoglobin 8.7 (*) 13.0 - 17.0 g/dL   HCT 40.9 (*) 81.1 - 91.4 %   MCV 93.3  78.0 - 100.0 fL   MCH 32.2  26.0 - 34.0 pg   MCHC 34.5  30.0 - 36.0 g/dL   RDW 78.2 (*) 95.6 - 21.3 %   Platelets 128 (*) 150 - 400 K/uL   Neutrophils Relative PENDING  43 - 77 %   Neutro Abs PENDING  1.7 - 7.7 K/uL   Band Neutrophils PENDING  0 - 10 %   Lymphocytes Relative PENDING  12 - 46  %   Lymphs Abs PENDING  0.7 - 4.0 K/uL   Monocytes Relative PENDING  3 - 12 %   Monocytes Absolute PENDING  0.1 - 1.0 K/uL   Eosinophils Relative PENDING  0 - 5 %   Eosinophils Absolute PENDING  0.0 - 0.7 K/uL   Basophils Relative PENDING  0 - 1 %   Basophils Absolute PENDING  0.0 - 0.1 K/uL   WBC Morphology PENDING     RBC Morphology PENDING     Smear Review PENDING     nRBC PENDING  0 /100 WBC   Metamyelocytes Relative PENDING     Myelocytes PENDING     Promyelocytes Absolute PENDING     Blasts PENDING    COMPREHENSIVE  METABOLIC PANEL      Component Value Range   Sodium 131 (*) 135 - 145 mEq/L   Potassium 3.0 (*) 3.5 - 5.1 mEq/L   Chloride 99  96 - 112 mEq/L   CO2 23  19 - 32 mEq/L   Glucose, Bld 95  70 - 99 mg/dL   BUN 14  6 - 23 mg/dL   Creatinine, Ser 1.19  0.50 - 1.35 mg/dL   Calcium 9.2  8.4 - 14.7 mg/dL   Total Protein 82.9 (*) 6.0 - 8.3 g/dL   Albumin 2.8 (*) 3.5 - 5.2 g/dL   AST 33  0 - 37 U/L   ALT 13  0 - 53 U/L   Alkaline Phosphatase 25 (*) 39 - 117 U/L   Total Bilirubin 1.2  0.3 - 1.2 mg/dL   GFR calc non Af Amer >90  >90 mL/min   GFR calc Af Amer >90  >90 mL/min   Dg Chest 2 View  09/02/2012  *RADIOLOGY REPORT*  Clinical Data: Headache, cough, fever  CHEST - 2 VIEW  Comparison: 05/29/2012  Findings: Cardiomediastinal silhouette is stable.  No acute infiltrate or pleural effusion.  No pulmonary edema.  Mild degenerative changes lower thoracic spine.  IMPRESSION: No active disease.  Mild degenerative changes lower thoracic spine.   Original Report Authenticated By: Natasha Mead, M.D.    Ct Head Wo Contrast  09/03/2012  *RADIOLOGY REPORT*  Clinical Data: Headache, altered mental status.  CT HEAD WITHOUT CONTRAST  Technique:  Contiguous axial images were obtained from the base of the skull through the vertex without contrast.  Comparison: May 08, 2007.  Findings: Diffuse lucencies are noted in the calvarium which are improved compared to the prior exam and are  consistent with history of multiple myeloma.  Mild frontal atrophy is noted which is unchanged compared to prior exam.  No mass effect or midline shift is noted.  Ventricular size is within normal limits.  There is no evidence of mass lesion, hemorrhage or acute infarction.  IMPRESSION: Lucencies noted throughout the calvarium on prior exam appear to be significantly improved consistent with the given history of multiple myeloma.  No acute intracranial abnormality is noted.   Original Report Authenticated By: Venita Sheffield., M.D.          1. Positive blood culture   2. Fever   3. Anemia   4. Multiple myeloma   5. Hyponatremia   6. Hypokalemia   7. Heart murmur    CRITICAL CARE Performed by: FAOZH,YQMVH   Total critical care time: 45 minutes  Critical care time was exclusive of separately billable procedures and treating other patients.  Critical care was necessary to treat or prevent imminent or life-threatening deterioration.  Critical care was time spent personally by me on the following activities: development of treatment plan with patient and/or surrogate as well as nursing, discussions with consultants, evaluation of patient's response to treatment, examination of patient, obtaining history from patient or surrogate, ordering and performing treatments and interventions, ordering and review of laboratory studies, ordering and review of radiographic studies, pulse oximetry and re-evaluation of patient's condition.    MDM  Fever, headache, hyponatremia worrisome for possible [facility] spotted fever. Prior records are reviewed and he was seen in the emergency department yesterday which time he did have hyponatremia sodium of 130. His chemotherapy had been discontinued in June of this year. Yesterday, he was felt to have a viral infection.  Workup here is significant for  a drop in hemoglobin, and increase in band count on WBC. Hypokalemia and hyponatremia persists. He was  given IV normal saline, and oral and IV potassium. Blood culture from yesterday was positive for gram-positive cocci in pairs and chains on one bottle and in clusters and chains in another bottle. Different bacteria growing in different blood culture bottles is suggestive of contamination, but she will empirically be started on doxycycline and vancomycin. Document spotted fever titers have been sent as well as 2 more blood cultures. Of note, the patient states he has a history of a heart murmur, but I do not see a heart murmur documented on any of his physical exams. Therefore, he should have an echocardiogram done to evaluate for possible endocarditis.  Case is discussed with Dr. Sherrie Mustache who was worried that there is no oncology physician here tomorrow and suggested that he should be transferred to [facility]. Case is discussed with Dr. Betti Cruz at [facility] who agrees to accept the patient in transfer.   I personally performed the services described in this documentation, which was scribed in my presence. The recorded information has been reviewed and considered.      Dione Booze, MD 09/03/12 [phone]

## 2012-09-03 NOTE — ED Notes (Signed)
Patient states he thinks he has the flu and does not wish to take the doxycycline

## 2012-09-03 NOTE — ED Notes (Signed)
Fever, headache, ams per wife sent by Dr. Sherwood Gambler who spoke with Dr. Preston Fleeting

## 2012-09-03 NOTE — ED Notes (Signed)
[facility] to department to transport pt.

## 2012-09-03 NOTE — ED Notes (Addendum)
Gram positive cocci in pairs and chains called from [facility] lab called to [employer] [facility].

## 2012-09-03 NOTE — ED Notes (Signed)
Patient returned to ED and is being admitted.

## 2012-09-03 NOTE — ED Notes (Signed)
Patient c/o fever and headache. Released from hospital yesterday, no improvement per patient. Sent by Dr Sherwood Gambler for further evaluation.

## 2012-09-03 NOTE — ED Notes (Signed)
Dr Karma Ganja requested that we call patient and have him return to ER.

## 2012-09-03 NOTE — ED Notes (Signed)
Dr Preston Fleeting in room to talk with patient

## 2012-09-03 NOTE — Plan of Care (Signed)
Name: [name] MRN: [id number] PCP: [name], MD Oncologist: Dr. Arbutus Ped.  51 year old gentleman with history of recurrent multiple myeloma, history of vasculitis and thrombosis of skin lesions, status post autologous peripheral blood stem cell transplant at [facility] on 02/02/2008 with recurrence of disease in December of 2010, hyponatremia, thrombocytopenia presented with fever. Initially presented with fever on 09/02/2012 thought to be due to upper respiratory viral infection. Given persistent fevers he presented back today to the ED. Blood cultures x2 from 09/02/2012 positive for gram positive cocci in pairs and chains. Patient has a headache per Dr. Preston Fleeting not suggestive of meningitis.  As there is no oncologist at [facility] this week, ED physician requested patient be transferred to [facility] for further care and management.  Recommendations to the ED: Start vancomycin for possible bacteremia. As patient is hemodynamically stable, appropriate for Med-Surg. [facility] 8 To page Flow manager [phone] on arrival.  BP 136/74  Pulse 102  Temp 102.5 F (39.2 C) (Oral)  Resp 20  Ht 5\' 3"  (1.6 m)  Wt 96.616 kg (213 lb)  BMI 37.73 kg/m2  SpO2 99%  CBC    Component Value Date/Time   WBC 5.6 09/03/2012 1426   WBC 3.3* 08/31/2012 1110   RBC 2.70* 09/03/2012 1426   RBC 2.90* 08/31/2012 1110   HGB 8.7* 09/03/2012 1426   HGB 9.7* 08/31/2012 1110   HCT 25.2* 09/03/2012 1426   HCT 27.4* 08/31/2012 1110   PLT 128* 09/03/2012 1426   PLT 149 08/31/2012 1110   MCV 93.3 09/03/2012 1426   MCV 94.5 08/31/2012 1110   MCH 32.2 09/03/2012 1426   MCH 33.4 08/31/2012 1110   MCHC 34.5 09/03/2012 1426   MCHC 35.4 08/31/2012 1110   RDW 16.2* 09/03/2012 1426   RDW 16.5* 08/31/2012 1110   LYMPHSABS 0.7 09/03/2012 1426   LYMPHSABS 0.7* 08/31/2012 1110   MONOABS 0.4 09/03/2012 1426   MONOABS 0.5 08/31/2012 1110   EOSABS 0.0 09/03/2012 1426   EOSABS 0.1  08/31/2012 1110   BASOSABS 0.0 09/03/2012 1426   BASOSABS 0.0 08/31/2012 1110   BMET    Component Value Date/Time   NA 131* 09/03/2012 1426   NA 135* 08/31/2012 1110   K 3.0* 09/03/2012 1426   K 3.8 08/31/2012 1110   CL 99 09/03/2012 1426   CL 107 08/31/2012 1110   CO2 23 09/03/2012 1426   CO2 20* 08/31/2012 1110   GLUCOSE 95 09/03/2012 1426   GLUCOSE 89 08/31/2012 1110   BUN 14 09/03/2012 1426   BUN 7.0 08/31/2012 1110   CREATININE 0.66 09/03/2012 1426   CREATININE 0.7 08/31/2012 1110   CALCIUM 9.2 09/03/2012 1426   CALCIUM 9.7 08/31/2012 1110   GFRNONAA >90 09/03/2012 1426   GFRAA >90 09/03/2012 1426   Tiarrah Saville A, MD 09/03/2012, 5:50 PM

## 2012-09-04 ENCOUNTER — Inpatient Hospital Stay (HOSPITAL_COMMUNITY): Payer: 59

## 2012-09-04 ENCOUNTER — Encounter: Payer: Self-pay | Admitting: Internal Medicine

## 2012-09-04 DIAGNOSIS — D649 Anemia, unspecified: Secondary | ICD-10-CM

## 2012-09-04 DIAGNOSIS — A419 Sepsis, unspecified organism: Secondary | ICD-10-CM | POA: Diagnosis present

## 2012-09-04 DIAGNOSIS — Z9484 Stem cells transplant status: Secondary | ICD-10-CM

## 2012-09-04 DIAGNOSIS — R509 Fever, unspecified: Secondary | ICD-10-CM

## 2012-09-04 DIAGNOSIS — B954 Other streptococcus as the cause of diseases classified elsewhere: Secondary | ICD-10-CM

## 2012-09-04 DIAGNOSIS — G8929 Other chronic pain: Secondary | ICD-10-CM

## 2012-09-04 DIAGNOSIS — Z7901 Long term (current) use of anticoagulants: Secondary | ICD-10-CM

## 2012-09-04 DIAGNOSIS — R7881 Bacteremia: Principal | ICD-10-CM | POA: Diagnosis present

## 2012-09-04 DIAGNOSIS — C9000 Multiple myeloma not having achieved remission: Secondary | ICD-10-CM

## 2012-09-04 LAB — CBC
HCT: 21.4 % — ABNORMAL LOW (ref 39.0–52.0)
MCH: 31.2 pg (ref 26.0–34.0)
MCHC: 34.1 g/dL (ref 30.0–36.0)
MCV: 91.5 fL (ref 78.0–100.0)
RDW: 16.2 % — ABNORMAL HIGH (ref 11.5–15.5)
WBC: 2.9 10*3/uL — ABNORMAL LOW (ref 4.0–10.5)

## 2012-09-04 LAB — ROCKY MTN SPOTTED FVR AB, IGG-BLOOD: RMSF IgG: 0.06 IV

## 2012-09-04 LAB — BASIC METABOLIC PANEL
BUN: 11 mg/dL (ref 6–23)
Calcium: 8.2 mg/dL — ABNORMAL LOW (ref 8.4–10.5)
Chloride: 103 mEq/L (ref 96–112)
Creatinine, Ser: 0.51 mg/dL (ref 0.50–1.35)
GFR calc Af Amer: >90 mL/min (ref >90)

## 2012-09-04 MED ORDER — VANCOMYCIN HCL IN DEXTROSE 1-5 GM/200ML-% IV SOLN
1000.0000 mg | Freq: Three times a day (TID) | INTRAVENOUS | Status: DC
  Administered 2012-09-04 – 2012-09-06 (×7): 1000 mg via INTRAVENOUS
  Filled 2012-09-04 (×8): qty 200

## 2012-09-04 MED ORDER — PNEUMOCOCCAL VAC POLYVALENT 25 MCG/0.5ML IJ INJ
0.5000 mL | INJECTION | Freq: Once | INTRAMUSCULAR | Status: AC
  Administered 2012-09-04: 0.5 mL via INTRAMUSCULAR
  Filled 2012-09-04: qty 0.5

## 2012-09-04 MED ORDER — HYDROCODONE-ACETAMINOPHEN 10-325 MG PO TABS
1.0000 | ORAL_TABLET | Freq: Four times a day (QID) | ORAL | Status: DC | PRN
  Administered 2012-09-04 – 2012-09-06 (×6): 2 via ORAL
  Filled 2012-09-04 (×6): qty 2

## 2012-09-04 MED ORDER — DOXYCYCLINE HYCLATE 100 MG IV SOLR
100.0000 mg | Freq: Two times a day (BID) | INTRAVENOUS | Status: DC
  Administered 2012-09-04: 100 mg via INTRAVENOUS
  Filled 2012-09-04 (×2): qty 100

## 2012-09-04 MED ORDER — POTASSIUM CHLORIDE CRYS ER 20 MEQ PO TBCR
40.0000 meq | EXTENDED_RELEASE_TABLET | Freq: Once | ORAL | Status: AC
  Administered 2012-09-04: 40 meq via ORAL
  Filled 2012-09-04: qty 2

## 2012-09-04 MED ORDER — BIOTENE DRY MOUTH MT LIQD
15.0000 mL | Freq: Two times a day (BID) | OROMUCOSAL | Status: DC
  Administered 2012-09-04 – 2012-09-06 (×6): 15 mL via OROMUCOSAL

## 2012-09-04 MED ORDER — ENSURE COMPLETE PO LIQD
237.0000 mL | Freq: Every day | ORAL | Status: DC
  Administered 2012-09-04 – 2012-09-06 (×3): 237 mL via ORAL

## 2012-09-04 MED ORDER — HYDROCHLOROTHIAZIDE 12.5 MG PO CAPS
12.5000 mg | ORAL_CAPSULE | Freq: Every day | ORAL | Status: DC
  Administered 2012-09-04 – 2012-09-07 (×4): 12.5 mg via ORAL
  Filled 2012-09-04 (×4): qty 1

## 2012-09-04 MED ORDER — INFLUENZA VIRUS VACC SPLIT PF IM SUSP
0.5000 mL | Freq: Once | INTRAMUSCULAR | Status: DC
  Filled 2012-09-04: qty 0.5

## 2012-09-04 MED ORDER — IRBESARTAN-HYDROCHLOROTHIAZIDE 300-12.5 MG PO TABS: 1.0000 | ORAL_TABLET | Freq: Every day | ORAL | Status: DC

## 2012-09-04 MED ORDER — GABAPENTIN 300 MG PO CAPS
300.0000 mg | ORAL_CAPSULE | Freq: Three times a day (TID) | ORAL | Status: DC
  Administered 2012-09-04 – 2012-09-07 (×10): 300 mg via ORAL
  Filled 2012-09-04 (×12): qty 1

## 2012-09-04 MED ORDER — PIPERACILLIN-TAZOBACTAM 3.375 G IVPB
3.3750 g | Freq: Three times a day (TID) | INTRAVENOUS | Status: DC
  Administered 2012-09-04 – 2012-09-05 (×3): 3.375 g via INTRAVENOUS
  Filled 2012-09-04 (×5): qty 50

## 2012-09-04 MED ORDER — MORPHINE SULFATE 15 MG PO TABS: 15.0000 mg | ORAL_TABLET | ORAL | Status: DC | PRN

## 2012-09-04 MED ORDER — IRBESARTAN 300 MG PO TABS
300.0000 mg | ORAL_TABLET | Freq: Every day | ORAL | Status: DC
  Administered 2012-09-04 – 2012-09-07 (×4): 300 mg via ORAL
  Filled 2012-09-04 (×4): qty 1

## 2012-09-04 MED ORDER — ACETAMINOPHEN 500 MG PO TABS: 1000.0000 mg | ORAL_TABLET | Freq: Four times a day (QID) | ORAL | Status: DC | PRN

## 2012-09-04 NOTE — Progress Notes (Signed)
Principle Diagnosis: #1 recurrent multiple myeloma IgG kappa subtype diagnosed in June of 2008 #2 history of vasculitis and thrombosis of skin lesions  Prior Therapy: #1 status post palliative radiotherapy to the left hip under the care of Dr. Roselind [name]  #2 status post 5 cycles of systemic chemotherapy with Revlimid and low dose Decadron. Last dose given June 2009 with good response.  #3 status post autologous peripheral blood stem cell transplant at [facility] on 02/02/2008.  #4 the patient had evidence for disease recurrence in December 2010.  #5 Revlimid 25 mg by mouth daily for 21 days every 4 weeks in addition to Decadron 40 mg orally on a weekly basis. The patient is status post 28 cycles, discontinued today secondary to disease progression.  #6 Systemic chemotherapy with Velcade 1.3 mg/M2 on days 1, 4, 8 and 11 in addition to Doxil 30 mg/M2 on day 4 and Decadron 40 mg by mouth on weekly basis every 3 weeks. Status post 3 cycles, last dose was given 05/04/2012 discontinued secondary to intolerance.  Current therapy:  Zometa 4 mg IV given every 3 months.  Subjective: The patient is seen and examined today. His wife was at the bedside. He is feeling better today. He was admitted yesterday with fever and chills and previous blood culture showed Gram-positive cocci bacteremia consistent with a Streptococcus. The patient was started on treatment with antibiotics including vancomycin. He denied having any significant fever or chills this morning. He has no chest pain, shortness breath, cough or hemoptysis.  Objective: Vital signs in last 24 hours: Temp:  [98.9 F (37.2 C)-100.9 F (38.3 C)] 99.3 F (37.4 C) (10/18 1518) Pulse Rate:  [81-98] 98  (10/18 1518) Resp:  [16-20] 18  (10/18 1518) BP: (119-130)/(60-79) 119/68 mmHg (10/18 1518) SpO2:  [97 %-100 %] 100 % (10/18 1518) Weight:  [218 lb 11.1 oz (99.2 kg)] 218 lb 11.1 oz (99.2 kg) (10/17 2250)  Intake/Output from previous day: 10/17  0701 - 10/18 0700 In: 865 [P.O.:350; I.V.:515] Out: -  Intake/Output this shift: Total I/O In: 480 [P.O.:480] Out: -   General appearance: alert, cooperative and no distress Resp: clear to auscultation bilaterally Cardio: regular rate and rhythm, S1, S2 normal, no murmur, click, rub or gallop GI: soft, non-tender; bowel sounds normal; no masses,  no organomegaly Extremities: extremities normal, atraumatic, no cyanosis or edema  Lab Results:   Basename 09/04/12 0330 09/03/12 1426  WBC 2.9* 5.6  HGB 7.4* 8.7*  HCT 21.4* 25.2*  PLT 94* 128*   BMET  Basename 09/04/12 0330 09/03/12 1426  NA 132* 131*  K 2.9* 3.0*  CL 103 99  CO2 19 23  GLUCOSE 102* 95  BUN 11 14  CREATININE 0.51 0.66  CALCIUM 8.2* 9.2    Studies/Results: Dg Chest 2 View  09/04/2012  *RADIOLOGY REPORT*  Clinical Data: Cough.  Bacteremia.  Evaluate for pneumonia.  CHEST - 2 VIEW  Comparison: Chest x-ray 09/02/2012.  Findings: There is a new area of airspace consolidation in the right upper lobe, concerning for right upper lobe pneumonia.  Lungs otherwise appear clear.  No definite pleural effusions.  Pulmonary vasculature and the cardiomediastinal silhouette are within normal limits.  IMPRESSION: 1.  New airspace consolidation in the right upper lobe concerning for developing right upper lobe pneumonia.   Original Report Authenticated By: [name], M.D.    Ct Head Wo Contrast  09/03/2012  *RADIOLOGY REPORT*  Clinical Data: Headache, altered mental status.  CT HEAD WITHOUT CONTRAST  Technique:  Contiguous axial images were obtained from the base of the skull through the vertex without contrast.  Comparison: May 08, 2007.  Findings: Diffuse lucencies are noted in the calvarium which are improved compared to the prior exam and are consistent with history of multiple myeloma.  Mild frontal atrophy is noted which is unchanged compared to prior exam.  No mass effect or midline shift is noted.  Ventricular size  is within normal limits.  There is no evidence of mass lesion, hemorrhage or acute infarction.  IMPRESSION: Lucencies noted throughout the calvarium on prior exam appear to be significantly improved consistent with the given history of multiple myeloma.  No acute intracranial abnormality is noted.   Original Report Authenticated By: [name]., M.D.     Medications: I have reviewed the patient's current medications.  Assessment/Plan: This is a very pleasant 51 years old [place] American male with progressive multiple myeloma status post several chemotherapy regimens in the past as well as peripheral blood autologous stem cell transplant. The patient was considered with salvage chemotherapy either with Pomalyst or Kyprolis. He was given some time to think about his options but unfortunately admitted yesterday with the bacteremia. I agree with the current treatment plan.  I would see the patient back for followup visit in 1-2 weeks for evaluation and considering starting his chemotherapy after improvement of his bacteremia. Thank you for taking good care of [name].  I will continue to follow the patient with you and assist in his management an as-needed basis.  LOS: 1 day    [name] K. 09/04/2012

## 2012-09-04 NOTE — Progress Notes (Signed)
ANTIBIOTIC CONSULT NOTE - INITIAL  Pharmacy Consult for vancomycin Indication: Bacteremia with GPC in pairs and chains.   Allergies  Allergen Reactions  . Red Dye Anaphylaxis    Lips swollen  3-4 times their baseline size  . Other Other (See Comments)    Strawberries "anything containing red dye"    Patient Measurements: Height: 5\' 3"  (160 cm) Weight: 218 lb 11.1 oz (99.2 kg) IBW/kg (Calculated) : 56.9  Adjusted Body Weight:   Vital Signs: Temp: 99.6 F (37.6 C) (10/17 2344) Temp src: Oral (10/17 2344) BP: 130/73 mmHg (10/17 2250) Pulse Rate: 81  (10/17 2250) Intake/Output from previous day:   Intake/Output from this shift:    Labs:  Basename 09/03/12 1426 09/02/12 1645  WBC 5.6 5.3  HGB 8.7* 9.6*  PLT 128* 135*  LABCREA -- --  CREATININE 0.66 0.64   Estimated Creatinine Clearance: 116.6 ml/min (by C-G formula based on Cr of 0.66). No results found for this basename: VANCOTROUGH:2,VANCOPEAK:2,VANCORANDOM:2,GENTTROUGH:2,GENTPEAK:2,GENTRANDOM:2,TOBRATROUGH:2,TOBRAPEAK:2,TOBRARND:2,AMIKACINPEAK:2,AMIKACINTROU:2,AMIKACIN:2, in the last 72 hours   Microbiology: Recent Results (from the past 720 hour(s))  CULTURE, BLOOD (ROUTINE X 2)     Status: Normal (Preliminary result)   Collection Time   09/02/12  4:45 PM      Component Value Range Status Comment   Specimen Description BLOOD RIGHT ARM   Final    Special Requests BOTTLES DRAWN AEROBIC AND ANAEROBIC 4CC EACH   Final    Culture  Setup Time 09/02/2012 22:43   Final    Culture     Final    Value: GRAM POSITIVE COCCI IN PAIRS AND CHAINS     Note: Gram Stain Report Called to,Read Back By and Verified With: REGINA MOORE 09/03/12 1255 BY [email or web address]   Report Status PENDING   Incomplete   CULTURE, BLOOD (ROUTINE X 2)     Status: Normal (Preliminary result)   Collection Time   09/02/12  4:50 PM      Component Value Range Status Comment   Specimen Description BLOOD RIGHT ARM   Final    Special Requests BOTTLES DRAWN  AEROBIC ONLY 5CC   Final    Culture  Setup Time 09/02/2012 22:43   Final    Culture     Final    Value: GRAM POSITIVE COCCI IN PAIRS AND CHAINS     Note: Gram Stain Report Called to,Read Back By and Verified With: REGINA MOORE 09/03/12 1255 BY [email or web address]   Report Status PENDING   Incomplete   URINE CULTURE     Status: Normal   Collection Time   09/02/12  5:05 PM      Component Value Range Status Comment   Specimen Description URINE, CLEAN CATCH   Final    Special Requests NONE   Final    Culture  Setup Time 09/03/2012 01:43   Final    Colony Count NO GROWTH   Final    Culture NO GROWTH   Final    Report Status 09/03/2012 FINAL   Final   RAPID STREP SCREEN     Status: Normal   Collection Time   09/03/12  3:23 PM      Component Value Range Status Comment   Streptococcus, Group A Screen (Direct) NEGATIVE  NEGATIVE Final     Medical History: Past Medical History  Diagnosis Date  . Multiple myeloma(203.0) 09/24/2011  . Hypertension     Medications:  Anti-infectives     Start     Dose/Rate Route Frequency Ordered Stop   09/04/12 0600  vancomycin (VANCOCIN) IVPB 1000 mg/200 mL premix        1,000 mg 200 mL/hr over 60 Minutes Intravenous Every 8 hours 09/04/12 0140     09/03/12 1745   vancomycin (VANCOCIN) IVPB 1000 mg/200 mL premix        1,000 mg 200 mL/hr over 60 Minutes Intravenous  Once 09/03/12 1740 09/03/12 2140   09/03/12 1700   doxycycline (VIBRAMYCIN) 100 mg in dextrose 5 % 250 mL IVPB  Status:  Discontinued        100 mg 125 mL/hr over 120 Minutes Intravenous Every 12 hours 09/03/12 1614 09/04/12 0137         Assessment: Patient with Bacteremia with GPC in pairs and chains.  First dose of antibiotics already given.  Goal of Therapy:  Vancomycin trough level 15-20 mcg/ml  Plan:  Measure antibiotic drug levels at steady state Follow up culture results Vancomycin 1gm iv q8hr  Darlina Guys, Jacquenette Shone Crowford 09/04/2012,1:41 AM

## 2012-09-04 NOTE — Progress Notes (Signed)
INITIAL ADULT NUTRITION ASSESSMENT Date: 09/04/2012   Time: 11:52 AM Reason for Assessment: Nutrition Risk   ASSESSMENT: Male 51 y.o.  Dx: Bacteremia  INTERVENTION: 1. Will order patient snacks BID to increase caloric intake.  2. Will order patient Ensure nutrition supplement once daily, provides 250 kcal and 9 grams of protein daily.  3. RD to follow for nutrition plan of care.    Hx:  Past Medical History  Diagnosis Date  . Multiple myeloma(203.0) 09/24/2011  . Hypertension     Related Meds:  Scheduled Meds:   . sodium chloride   Intravenous STAT  . acetaminophen  650 mg Oral Once  . acetaminophen  650 mg Oral Once  . antiseptic oral rinse  15 mL Mouth Rinse BID  . doxycycline (VIBRAMYCIN) IV  100 mg Intravenous Q12H  . gabapentin  300 mg Oral STAT  . gabapentin  300 mg Oral TID  . hydrochlorothiazide  12.5 mg Oral Daily  . ibuprofen  400 mg Oral Once  . irbesartan  300 mg Oral Daily  . pneumococcal 23 valent vaccine  0.5 mL Intramuscular Once  . potassium chloride  10 mEq Intravenous Once  . potassium chloride SA  40 mEq Oral Once  . potassium chloride SA  40 mEq Oral Once  . sodium chloride  1,000 mL Intravenous Once  . vancomycin  1,000 mg Intravenous Once  . vancomycin  1,000 mg Intravenous Q8H  . DISCONTD: sodium chloride   Intravenous Once  . DISCONTD: acetaminophen  650 mg Oral Once  . DISCONTD: doxycycline (VIBRAMYCIN) IV  100 mg Intravenous Q12H  . DISCONTD: influenza  inactive virus vaccine  0.5 mL Intramuscular Once  . DISCONTD: irbesartan-hydrochlorothiazide  1 tablet Oral Q breakfast   Continuous Infusions:  PRN Meds:.acetaminophen, HYDROcodone-acetaminophen, morphine, ondansetron (ZOFRAN) IV, DISCONTD: acetaminophen, DISCONTD: ibuprofen   Ht: 5\' 3"  (160 cm)  Wt: 218 lb 11.1 oz (99.2 kg)  Ideal Wt: 56.3 kg % Ideal Wt: 176% Wt Readings from Last 10 Encounters:  09/03/12 218 lb 11.1 oz (99.2 kg)  08/31/12 223 lb 6.4 oz (101.334 kg)  08/06/12  223 lb (101.152 kg)  07/27/12 229 lb 6.4 oz (104.055 kg)  06/08/12 233 lb 11.2 oz (106.006 kg)  05/30/12 232 lb 12.9 oz (105.6 kg)  05/25/12 236 lb (107.049 kg)  05/11/12 238 lb 8 oz (108.183 kg)  04/20/12 256 lb 14.4 oz (116.529 kg)  04/09/12 250 lb 4.8 oz (113.535 kg)  *Weight down 32 lb over 5 months, 12.8% from baseline.   Usual Wt: 240 lb per patient  % Usual Wt: 91%  Body mass index is 38.74 kg/(m^2). (Obesity class II)   Food/Nutrition Related Hx: Patient reported he has had no PO intake for 3 days PTA. He reported this morning he ate < 50% of his meal on heart healthy diet. Patient agreed to receive snacks and Ensure to increase caloric intake.   Labs:  CMP     Component Value Date/Time   NA 132* 09/04/2012 0330   NA 135* 08/31/2012 1110   K 2.9* 09/04/2012 0330   K 3.8 08/31/2012 1110   CL 103 09/04/2012 0330   CL 107 08/31/2012 1110   CO2 19 09/04/2012 0330   CO2 20* 08/31/2012 1110   GLUCOSE 102* 09/04/2012 0330   GLUCOSE 89 08/31/2012 1110   BUN 11 09/04/2012 0330   BUN 7.0 08/31/2012 1110   CREATININE 0.51 09/04/2012 0330   CREATININE 0.7 08/31/2012 1110   CALCIUM 8.2* 09/04/2012 0330  CALCIUM 9.7 08/31/2012 1110   PROT 10.9* 09/03/2012 1426   PROT 10.1* 08/31/2012 1110   ALBUMIN 2.8* 09/03/2012 1426   ALBUMIN 3.0* 08/31/2012 1110   AST 33 09/03/2012 1426   AST 17 08/31/2012 1110   ALT 13 09/03/2012 1426   ALT 14 08/31/2012 1110   ALKPHOS 25* 09/03/2012 1426   ALKPHOS 25* 08/31/2012 1110   BILITOT 1.2 09/03/2012 1426   BILITOT 0.50 08/31/2012 1110   GFRNONAA >90 09/04/2012 0330   GFRAA >90 09/04/2012 0330    Intake/Output Summary (Last 24 hours) at 09/04/12 1156 Last data filed at 09/04/12 0503  Gross per 24 hour  Intake    865 ml  Output      0 ml  Net    865 ml     Diet Order: Cardiac  Supplements/Tube Feeding: none a this time  IVF:    Estimated Nutritional Needs:   Kcal: 1610-9604 Protein: 118-128 grams  Fluid: 1 ml per kcal  intake   NUTRITION DIAGNOSIS: -Inadequate oral intake (NI-2.1).  Status: Ongoing  RELATED TO: poor appetite and weight loss   AS EVIDENCE BY: Pt reported no PO intake over the past 3 days and pt with 32 lb weight loss over 5 months.   MONITORING/EVALUATION(Goals): PO intake, weights, labs 1. PO intake > 75% at meals, snacks and supplements.  2. Minimize weight loss.   EDUCATION NEEDS: -No education needs identified at this time  INTERVENTION: 1. Will order patient snacks BID to increase caloric intake.  2. Will order patient Ensure nutrition supplement once daily, provides 250 kcal and 9 grams of protein daily.  3. RD to follow for nutrition plan of care.    Dietitian [phone]  DOCUMENTATION CODES Per approved criteria  -Severe malnutrition in the context of chronic illness  *Patient meets malnutrition criteria due to 12.8% weight loss form baseline over 5 months and PO intake likely meeting </=75% of estimated energy needs for >/= 1 month.   Iven Finn [facility] 09/04/2012, 11:52 AM

## 2012-09-04 NOTE — Progress Notes (Signed)
Initial review for inpatient status is complete. 

## 2012-09-04 NOTE — Progress Notes (Signed)
CRITICAL VALUE ALERT  Critical value received:  Blood  Date of notification:  09/04/2012  Time of notification:  06:10  Critical value read back: yes  Nurse who received alert:  Treasa [employer]  MD notified (1st page):  Lenny Pastel  Time of first page:  06:17  MD notified (2nd page):  Time of second page:  Responding MD:  Lenny Pastel  Time MD responded:  06:20  No new orders received.  Will continue to monitor the pt. Daphene Calamity [place] 6:26 AM 09/04/2012

## 2012-09-04 NOTE — Progress Notes (Signed)
Pt needs 1 unit blood transfusion tonight.  His initial VS are T 102.7, R 36, BP 140/80, Pulse 93 and O2 98%.  Notified MD of abnormal temperature and respirations.  No new orders but recommended 2 Vicodin as already ordered prior to transfusion.  Will continue to monitor pt. Daphene Calamity [place] 8:46 PM 09/04/2012

## 2012-09-04 NOTE — Progress Notes (Signed)
ANTIBIOTIC CONSULT NOTE - INITIAL  Pharmacy Consult for vancomycin/Zosyn  Indication: Bacteremia with GPC in pairs and chains.   Allergies  Allergen Reactions  . Red Dye Anaphylaxis    Lips swollen  3-4 times their baseline size  . Other Other (See Comments)    Strawberries "anything containing red dye"    Patient Measurements: Height: 5\' 3"  (160 cm) Weight: 218 lb 11.1 oz (99.2 kg) IBW/kg (Calculated) : 56.9     Vital Signs: Temp: 99.3 F (37.4 C) (10/18 0515) Temp src: Oral (10/18 0515) BP: 126/79 mmHg (10/18 0515) Pulse Rate: 92  (10/18 0515) Intake/Output from previous day: 10/17 0701 - 10/18 0700 In: 865 [P.O.:350; I.V.:515] Out: -  Intake/Output from this shift: Total I/O In: 240 [P.O.:240] Out: -   Labs:  Basename 09/04/12 0330 09/03/12 1426 09/02/12 1645  WBC 2.9* 5.6 5.3  HGB 7.4* 8.7* 9.6*  PLT 94* 128* 135*  LABCREA -- -- --  CREATININE 0.51 0.66 0.64   Estimated Creatinine Clearance: 116.6 ml/min (by C-G formula based on Cr of 0.51). No results found for this basename: VANCOTROUGH:2,VANCOPEAK:2,VANCORANDOM:2,GENTTROUGH:2,GENTPEAK:2,GENTRANDOM:2,TOBRATROUGH:2,TOBRAPEAK:2,TOBRARND:2,AMIKACINPEAK:2,AMIKACINTROU:2,AMIKACIN:2, in the last 72 hours   Microbiology: Recent Results (from the past 720 hour(s))  CULTURE, BLOOD (ROUTINE X 2)     Status: Normal (Preliminary result)   Collection Time   09/02/12  4:45 PM      Component Value Range Status Comment   Specimen Description BLOOD RIGHT ARM   Final    Special Requests BOTTLES DRAWN AEROBIC AND ANAEROBIC 4CC EACH   Final    Culture  Setup Time 09/02/2012 22:43   Final    Culture     Final    Value: STREPTOCOCCUS SPECIES     Note: Gram Stain Report Called to,Read Back By and Verified With: REGINA MOORE 09/03/12 1255 BY [email or web address]   Report Status PENDING   Incomplete   CULTURE, BLOOD (ROUTINE X 2)     Status: Normal (Preliminary result)   Collection Time   09/02/12  4:50 PM      Component Value Range  Status Comment   Specimen Description BLOOD RIGHT ARM   Final    Special Requests BOTTLES DRAWN AEROBIC ONLY 5CC   Final    Culture  Setup Time 09/02/2012 22:43   Final    Culture     Final    Value: STREPTOCOCCUS SPECIES     Note: Gram Stain Report Called to,Read Back By and Verified With: REGINA MOORE 09/03/12 1255 BY [email or web address]   Report Status PENDING   Incomplete   URINE CULTURE     Status: Normal   Collection Time   09/02/12  5:05 PM      Component Value Range Status Comment   Specimen Description URINE, CLEAN CATCH   Final    Special Requests NONE   Final    Culture  Setup Time 09/03/2012 01:43   Final    Colony Count NO GROWTH   Final    Culture NO GROWTH   Final    Report Status 09/03/2012 FINAL   Final   CULTURE, BLOOD (ROUTINE X 2)     Status: Normal (Preliminary result)   Collection Time   09/03/12  2:27 PM      Component Value Range Status Comment   Specimen Description BLOOD LEFT ANTECUBITAL DRAWN BY RN   Final    Special Requests BOTTLES DRAWN AEROBIC AND ANAEROBIC [facility]   Final    Culture     Final    Value:  GRAM POSITIVE COCCI IN PAIRS     Gram Stain Report Called to,Read Back By and Verified With: MURRY C AT [place] 3E AT 0610 ON [id number] BY FORSYTH K   Report Status PENDING   Incomplete   CULTURE, BLOOD (ROUTINE X 2)     Status: Normal (Preliminary result)   Collection Time   09/03/12  2:29 PM      Component Value Range Status Comment   Specimen Description BLOOD LEFT ANTECUBITAL   Final    Special Requests BOTTLES DRAWN AEROBIC AND ANAEROBIC 7CC   Final    Culture     Final    Value: GRAM POSITIVE COCCI IN PAIRS     Gram Stain Report Called to,Read Back By and Verified With: MURRY C AT [place] 3E AT 0610 ON [id number] BY FORSYTH K   Report Status PENDING   Incomplete   RAPID STREP SCREEN     Status: Normal   Collection Time   09/03/12  3:23 PM      Component Value Range Status Comment   Streptococcus, Group A Screen (Direct) NEGATIVE  NEGATIVE Final      Medical History: Past Medical History  Diagnosis Date  . Multiple myeloma(203.0) 09/24/2011  . Hypertension     Medications:  Anti-infectives     Start     Dose/Rate Route Frequency Ordered Stop   09/04/12 1400   piperacillin-tazobactam (ZOSYN) IVPB 3.375 g        3.375 g 12.5 mL/hr over 240 Minutes Intravenous 3 times per day 09/04/12 1253     09/04/12 0600   vancomycin (VANCOCIN) IVPB 1000 mg/200 mL premix        1,000 mg 200 mL/hr over 60 Minutes Intravenous Every 8 hours 09/04/12 0140     09/04/12 0600   doxycycline (VIBRAMYCIN) 100 mg in dextrose 5 % 250 mL IVPB  Status:  Discontinued        100 mg 125 mL/hr over 120 Minutes Intravenous Every 12 hours 09/04/12 0151 09/04/12 1235   09/03/12 1745   vancomycin (VANCOCIN) IVPB 1000 mg/200 mL premix        1,000 mg 200 mL/hr over 60 Minutes Intravenous  Once 09/03/12 1740 09/03/12 2140   09/03/12 1700   doxycycline (VIBRAMYCIN) 100 mg in dextrose 5 % 250 mL IVPB  Status:  Discontinued        100 mg 125 mL/hr over 120 Minutes Intravenous Every 12 hours 09/03/12 1614 09/04/12 0137         Assessment:  51 yo M with h/o recurrent multiple myeloma, admitted with febrile neutropenia,  blood cultures positive for GPC bacteremia on D#2 vancomycin and D#1 Zosyn   Renal function WNL, low grade fevers today   10/17 Blood cultures x 2 - GPC in pairs - pending    Goal of Therapy:  Vancomycin trough level 15-20 mcg/ml  Plan:  1.) Continue vancomycin 1 gram IV q8h 2.) Start Zosyn 3.375 grams IV q8h  3.) Follow up blood cultures  4.) Check vancomycin trough at concentration steady state  Aspen Deterding, Loma Messing PharmD Pager #: [phone] 1:23 PM 09/04/2012

## 2012-09-04 NOTE — Progress Notes (Signed)
ANTICOAGULATION CONSULT NOTE - Initial Consult  Pharmacy Consult for warfarin Indication: Vasculitis   Allergies  Allergen Reactions  . Red Dye Anaphylaxis    Lips swollen  3-4 times their baseline size  . Other Other (See Comments)    Strawberries "anything containing red dye"    Patient Measurements: Height: 5\' 3"  (160 cm) Weight: 218 lb 11.1 oz (99.2 kg) IBW/kg (Calculated) : 56.9  Heparin Dosing Weight:   Vital Signs: Temp: 99.3 F (37.4 C) (10/18 0515) Temp src: Oral (10/18 0515) BP: 126/79 mmHg (10/18 0515) Pulse Rate: 92  (10/18 0515)  Labs:  Basename 09/04/12 0805 09/04/12 0330 09/03/12 1426 09/02/12 1645  HGB -- 7.4* 8.7* --  HCT -- 21.4* 25.2* 27.8*  PLT -- 94* 128* 135*  APTT -- -- -- --  LABPROT 19.8* -- -- 18.5*  INR 1.75* -- -- 1.59*  HEPARINUNFRC -- -- -- --  CREATININE -- 0.51 0.66 0.64  CKTOTAL -- -- -- --  CKMB -- -- -- --  TROPONINI -- -- -- --    Estimated Creatinine Clearance: 116.6 ml/min (by C-G formula based on Cr of 0.51).   Medical History: Past Medical History  Diagnosis Date  . Multiple myeloma(203.0) 09/24/2011  . Hypertension     Medications:  Prescriptions prior to admission  Medication Sig Dispense Refill  . acetaminophen (TYLENOL) 500 MG tablet Take 1,000 mg by mouth every 6 (six) hours as needed. For pain/fever.      . gabapentin (NEURONTIN) 300 MG capsule Take 300 mg by mouth 3 (three) times daily.      [name] HYDROcodone-acetaminophen (NORCO) 10-325 MG per tablet Take 1-2 tablets by mouth every 6 (six) hours as needed. For pain.      [name] irbesartan-hydrochlorothiazide (AVALIDE) 300-12.5 MG per tablet Take 1 tablet by mouth daily with breakfast.      . morphine (MSIR) 15 MG tablet Take 15 mg by mouth every 4 (four) hours as needed. For pain      . warfarin (COUMADIN) 5 MG tablet Take 5-7.5 mg by mouth daily. Takes 5 mg Tuesday, Thursday and Saturday;  Takes 7.5mg  on Monday, Wednesday, Friday and Sunday.         Assessment:  51 yo M with h/o recurrent multiple myeloma, admitted with fever.  Patient takes chronic coumadin for vasculitis.  INR on admission was subtherapeutic  On admission, is still low today but moving toward goal today;  Home dose = 5 mg on TTSat and 7.5 mg on all other days; last dose reported 10/16  Spoke with Dr. Elisabeth Pigeon regarding drop in hgb 7.4 < 8.7 < 9.6.  Is thought to be disease related as there are no s/sx of active bleeding. Verbal order to proceed with coumadin dosing.  Blood transfusion planned   Goal of Therapy:  INR 2-3    Plan:  1.) Coumadin 8 mg po x 1 tonight at 1800  2.) Daily PT/INR 3.) Repeat CBC in am   BorgerdingLoma Messing PharmD Pager #: [phone] 1:03 PM 09/04/2012

## 2012-09-04 NOTE — Progress Notes (Signed)
Put fmla and disability papers on nurse's desk.

## 2012-09-04 NOTE — H&P (Signed)
[facility] History and Physical  [name] FAO:[id number] DOB: 1961-10-28 DOA: 09/03/2012  Referring physician: ED PCP: [name], MD  Specialists: Oncology (put consult order in per routine so he shows up on their list)  Chief Complaint: Fever  HPI: [name] is a 51 y.o. male h/o recurrent multiple myeloma, vasculitis and skin lesions in past, having for past 3 days.  Severity documented to be as high as 104.2 yesterday in ED.  Associated with headache but no meningismus, has been having cough as well.  No skin rash, no pain anywhere.  No exposures to animals no exposure to being in the woods, no exposure to tick bites.  Yesterday patient was treated, cultures obtained and patient sent home, he did not improve and today 10/17 when he came into the ED it was revealed that his cultures from 10/16 had come back 2 out of 2 positive for GPC in pairs and chains.  The patient was started on Vancomycin and doxycycline empirically and transferred to [facility] from [facility] (since [facility] does not have an oncologist on staff this week) for further treatment.  Review of Systems: 12 systems reviewed and otherwise negative.  Past Medical History  Diagnosis Date  . Multiple myeloma(203.0) 09/24/2011  . Hypertension    History reviewed. No pertinent past surgical history. Social History:  reports that he has never smoked. He does not have any smokeless tobacco history on file. His alcohol and drug histories not on file. Patient lives at home, performs all ADLs  Allergies  Allergen Reactions  . Red Dye Anaphylaxis    Lips swollen  3-4 times their baseline size  . Other Other (See Comments)    Strawberries "anything containing red dye"    No family history on file. No one else in the family has been sick with any sort of strep throat, rash, or fever.  Prior to Admission medications   Medication Sig Start Date End Date Taking? Authorizing Provider  acetaminophen (TYLENOL) 500  MG tablet Take 1,000 mg by mouth every 6 (six) hours as needed. For pain/fever.   Yes Historical Provider, MD  gabapentin (NEURONTIN) 300 MG capsule Take 300 mg by mouth 3 (three) times daily.   Yes Historical Provider, MD  HYDROcodone-acetaminophen (NORCO) 10-325 MG per tablet Take 1-2 tablets by mouth every 6 (six) hours as needed. For pain.   Yes Historical Provider, MD  irbesartan-hydrochlorothiazide (AVALIDE) 300-12.5 MG per tablet Take 1 tablet by mouth daily with breakfast.   Yes Historical Provider, MD  morphine (MSIR) 15 MG tablet Take 15 mg by mouth every 4 (four) hours as needed. For pain   Yes Historical Provider, MD  warfarin (COUMADIN) 5 MG tablet Take 5-7.5 mg by mouth daily. Takes 5 mg Tuesday, Thursday and Saturday;  Takes 7.5mg  on Monday, Wednesday, Friday and Sunday.   Yes Historical Provider, MD   Physical Exam: Filed Vitals:   09/03/12 2141 09/03/12 2143 09/03/12 2250 09/03/12 2344  BP: 123/71  130/73   Pulse:   81   Temp:  99.8 F (37.7 C) 98.9 F (37.2 C) 99.6 F (37.6 C)  TempSrc:  Oral Oral Oral  Resp:  18 16   Height:   5\' 3"  (1.6 m)   Weight:   99.2 kg (218 lb 11.1 oz)   SpO2:   100%     General:  NAD, resting comfortably in bed Eyes: PEERLA EOMI ENT: mucous membranes moist Neck: supple w/o JVD Cardiovascular: RRR with 3/6 SEM Respiratory:  CTA B Abdomen: soft, nt, nd, bs+ Skin: no rash nor lesion Musculoskeletal: MAE, full ROM all 4 extremities Psychiatric: normal tone and affect Neurologic: AAOx3, grossly non-focal  Labs on Admission:  Basic Metabolic Panel:  Lab 09/03/12 9604 09/02/12 1645 08/31/12 1110  NA 131* 130* 135*  K 3.0* 2.9* 3.8  CL 99 96 107  CO2 23 24 20*  GLUCOSE 95 114* 89  BUN 14 11 7.0  CREATININE 0.66 0.64 0.7  CALCIUM 9.2 9.1 9.7  MG -- -- --  PHOS -- -- --   Liver Function Tests:  Lab 09/03/12 1426 09/02/12 1645 08/31/12 1110  AST 33 18 17  ALT 13 12 14   ALKPHOS 25* 22* 25*  BILITOT 1.2 0.9 0.50  PROT 10.9* 10.3*  10.1*  ALBUMIN 2.8* 2.8* 3.0*   No results found for this basename: LIPASE:5,AMYLASE:5 in the last 168 hours No results found for this basename: AMMONIA:5 in the last 168 hours CBC:  Lab 09/03/12 1426 09/02/12 1645 08/31/12 1110  WBC 5.6 5.3 3.3*  NEUTROABS 4.5 4.0 2.0  HGB 8.7* 9.6* 9.7*  HCT 25.2* 27.8* 27.4*  MCV 93.3 92.7 94.5  PLT 128* 135* 149   Cardiac Enzymes: No results found for this basename: CKTOTAL:5,CKMB:5,CKMBINDEX:5,TROPONINI:5 in the last 168 hours  BNP (last 3 results) No results found for this basename: PROBNP:3 in the last 8760 hours CBG: No results found for this basename: GLUCAP:5 in the last 168 hours  Radiological Exams on Admission: Dg Chest 2 View  09/02/2012  *RADIOLOGY REPORT*  Clinical Data: Headache, cough, fever  CHEST - 2 VIEW  Comparison: 05/29/2012  Findings: Cardiomediastinal silhouette is stable.  No acute infiltrate or pleural effusion.  No pulmonary edema.  Mild degenerative changes lower thoracic spine.  IMPRESSION: No active disease.  Mild degenerative changes lower thoracic spine.   Original Report Authenticated By: Natasha Mead, M.D.    Ct Head Wo Contrast  09/03/2012  *RADIOLOGY REPORT*  Clinical Data: Headache, altered mental status.  CT HEAD WITHOUT CONTRAST  Technique:  Contiguous axial images were obtained from the base of the skull through the vertex without contrast.  Comparison: May 08, 2007.  Findings: Diffuse lucencies are noted in the calvarium which are improved compared to the prior exam and are consistent with history of multiple myeloma.  Mild frontal atrophy is noted which is unchanged compared to prior exam.  No mass effect or midline shift is noted.  Ventricular size is within normal limits.  There is no evidence of mass lesion, hemorrhage or acute infarction.  IMPRESSION: Lucencies noted throughout the calvarium on prior exam appear to be significantly improved consistent with the given history of multiple myeloma.  No acute  intracranial abnormality is noted.   Original Report Authenticated By: Venita Sheffield., M.D.     EKG: Independently reviewed.  Assessment/Plan Principal Problem:  *Bacteremia Active Problems:  Chronic pain  Hypertension  Sepsis   1. Bacteremia with sepsis - tachycardia and fever as high as 104.2, GPC in pairs and chains on 2 out of 2 blood cultures on 10/16.  Treating with Vancomycin IV pharmacy to dose.  Blood culture report says pairs and chains for both bottles, indicating that this is less likely to be a contaminate and more likely to be the causative organism.  Will leave patient on doxycycline until cultures finalized on the off chance that this still might be a contaminate and his very high fever 104.2 (unusual in and of itself) might be due to  a RMSF. 2. HTN - leave on home meds 3. Chronic pain - leave on home meds 4. Multiple myeloma - chronic and stable at this point, oncology follows this.  Code Status: Full Code Family Communication: Spoke with patient and wife who is in room Disposition Plan: Admit to inpatient, anticipate prolonged course of IV abx for bacteremia.  Time spent: 32  GARDNER, JARED M. [facility] Pager [phone]  If 7PM-7AM, please contact night-coverage [email or web address] Password [email or web address] 09/04/2012, 1:07 AM

## 2012-09-04 NOTE — Progress Notes (Signed)
Addendum to admission note: Patient was seen and examined at bedside. Labs and diagnostic studies were reviewed with the patient and family. We will make the following changes: 1. Fever, neutropenia - we will D?C doxycycline and start zosyn 2. Acute blood loss anemia - we will keep coumadin as there is no sign of active bleed; transfuse 1 unit PRBC and follow up Hgb in am. If further Hgb drop we may consider stoppin coumadin.  [name] [facility] [phone]

## 2012-09-04 NOTE — Progress Notes (Signed)
ANTICOAGULATION CONSULT NOTE - Initial Consult  Pharmacy Consult for warfarin Indication: Vasculitis   Allergies  Allergen Reactions  . Red Dye Anaphylaxis    Lips swollen  3-4 times their baseline size  . Other Other (See Comments)    Strawberries "anything containing red dye"    Patient Measurements: Height: 5\' 3"  (160 cm) Weight: 218 lb 11.1 oz (99.2 kg) IBW/kg (Calculated) : 56.9  Heparin Dosing Weight:   Vital Signs: Temp: 99.3 F (37.4 C) (10/18 0515) Temp src: Oral (10/18 0515) BP: 126/79 mmHg (10/18 0515) Pulse Rate: 92  (10/18 0515)  Labs:  Basename 09/04/12 0330 09/03/12 1426 09/02/12 1645  HGB 7.4* 8.7* --  HCT 21.4* 25.2* 27.8*  PLT 94* 128* 135*  APTT -- -- --  LABPROT -- -- 18.5*  INR -- -- 1.59*  HEPARINUNFRC -- -- --  CREATININE 0.51 0.66 0.64  CKTOTAL -- -- --  CKMB -- -- --  TROPONINI -- -- --    Estimated Creatinine Clearance: 116.6 ml/min (by C-G formula based on Cr of 0.51).   Medical History: Past Medical History  Diagnosis Date  . Multiple myeloma(203.0) 09/24/2011  . Hypertension     Medications:  Prescriptions prior to admission  Medication Sig Dispense Refill  . acetaminophen (TYLENOL) 500 MG tablet Take 1,000 mg by mouth every 6 (six) hours as needed. For pain/fever.      . gabapentin (NEURONTIN) 300 MG capsule Take 300 mg by mouth 3 (three) times daily.      [name] HYDROcodone-acetaminophen (NORCO) 10-325 MG per tablet Take 1-2 tablets by mouth every 6 (six) hours as needed. For pain.      [name] irbesartan-hydrochlorothiazide (AVALIDE) 300-12.5 MG per tablet Take 1 tablet by mouth daily with breakfast.      . morphine (MSIR) 15 MG tablet Take 15 mg by mouth every 4 (four) hours as needed. For pain      . warfarin (COUMADIN) 5 MG tablet Take 5-7.5 mg by mouth daily. Takes 5 mg Tuesday, Thursday and Saturday;  Takes 7.5mg  on Monday, Wednesday, Friday and Sunday.        Assessment: Patient being [facility] by [facility] anti[facility]ag clinic for  Vasculitis.  INR unknown this am.  INR is now ordered.  Plan to continue prior dosing if INR at or below goal. Goal of Therapy:  INR 2-3    Plan:  follow up with INR and dose warfarin as needed. Daily INR  Darlina Guys, Jacquenette Shone [place] 09/04/2012,6:29 AM

## 2012-09-05 LAB — BASIC METABOLIC PANEL
BUN: 9 mg/dL (ref 6–23)
Chloride: 100 mEq/L (ref 96–112)
Glucose, Bld: 83 mg/dL (ref 70–99)
Potassium: 3.1 mEq/L — ABNORMAL LOW (ref 3.5–5.1)

## 2012-09-05 LAB — TYPE AND SCREEN
ABO/RH(D): O POS
Antibody Screen: NEGATIVE
Donor AG Type: NEGATIVE

## 2012-09-05 LAB — CULTURE, BLOOD (ROUTINE X 2)

## 2012-09-05 LAB — PROTIME-INR: INR: 1.46 (ref 0.00–1.49)

## 2012-09-05 LAB — CBC
HCT: 23.3 % — ABNORMAL LOW (ref 39.0–52.0)
Hemoglobin: 8.1 g/dL — ABNORMAL LOW (ref 13.0–17.0)
WBC: 2.8 10*3/uL — ABNORMAL LOW (ref 4.0–10.5)

## 2012-09-05 MED ORDER — LEVOFLOXACIN IN D5W 750 MG/150ML IV SOLN
750.0000 mg | INTRAVENOUS | Status: DC
  Administered 2012-09-05 – 2012-09-06 (×2): 750 mg via INTRAVENOUS
  Filled 2012-09-05 (×3): qty 150

## 2012-09-05 MED ORDER — POTASSIUM CHLORIDE CRYS ER 20 MEQ PO TBCR
40.0000 meq | EXTENDED_RELEASE_TABLET | Freq: Once | ORAL | Status: AC
  Administered 2012-09-05: 40 meq via ORAL
  Filled 2012-09-05: qty 2

## 2012-09-05 MED ORDER — WARFARIN SODIUM 10 MG PO TABS
10.0000 mg | ORAL_TABLET | Freq: Once | ORAL | Status: AC
  Administered 2012-09-05: 10 mg via ORAL
  Filled 2012-09-05: qty 1

## 2012-09-05 MED ORDER — WARFARIN - PHARMACIST DOSING INPATIENT: Freq: Every day | Status: DC

## 2012-09-05 NOTE — Progress Notes (Signed)
ANTICOAGULATION CONSULT NOTE - Follow Up Consult  Pharmacy Consult for Coumadin Indication: vasculitis in pt with h/o MM  Allergies  Allergen Reactions  . Red Dye Anaphylaxis    Lips swollen  3-4 times their baseline size  . Other Other (See Comments)    Strawberries "anything containing red dye"   Labs:  Basename 09/05/12 0330 09/04/12 0805 09/04/12 0330 09/03/12 1426 09/02/12 1645  HGB 8.1* -- 7.4* -- --  HCT 23.3* -- 21.4* 25.2* --  PLT 101* -- 94* 128* --  APTT -- -- -- -- --  LABPROT 17.3* 19.8* -- -- 18.5*  INR 1.46 1.75* -- -- 1.59*  HEPARINUNFRC -- -- -- -- --  CREATININE 0.54 -- 0.51 0.66 --  CKTOTAL -- -- -- -- --  CKMB -- -- -- -- --  TROPONINI -- -- -- -- --   Assessment:  51 yo M with h/o recurrent multiple myeloma, admitted with fever. Patient takes chronic coumadin for vasculitis (this is the only indication listed in all past notes).  INR on admission was subtherapeutic.  Home dose = 5 mg on TTSat and 7.5 mg on all other days; last dose reported 10/16.  Of note, coumadin was held for [facility] placement for possible stem cell transplant (but this was cancelled), so coumadin was resumed on 10/10.   Coumadin last night missed by pharmacy per error. INR today down to 1.46.  Hgb better 8.1  No bleeding/complications related to anticoagulation therapy noted.   Goal of Therapy:  INR 2-3 Monitor platelets by anticoagulation protocol: Yes   Plan:   Coumadin 10 mg po now  Daily PT/INR  Pharmacy will f/u  Geoffry Paradise, PharmD, BCPS Pager: [phone] 8:56 AM Pharmacy #: [phone]

## 2012-09-05 NOTE — Progress Notes (Addendum)
[facility] PROGRESS NOTE  THADDUS MCDOWELL ION:[id number] DOB: June 27, 1961 DOA: 09/03/2012 PCP: [name], MD  Brief narrative: 51 year old male with history of multiple myeloma presented with fevers and found to have strep pneumoniae bacteremia.  Assessment/Plan:  Principal Problem:  *Gram positive bacteremia  Secondary to strep pneumoniae bacteremia  Blood cultures from 10-Sep-2023 and 10/17 show strep pneumoniae  Rapid strep test negative  Based on sensitivity studies for blood cultures drawn 2023-09-10  Susceptibility is to ceftriaxone and levaquin  Will d/c zosyn, leave Vanco for final result of blood culture drawn 10/17  We will start Levaquin daily IV  Follow up blood culture results drawn 09/05/2102 to ensure clearing of bacteremia  Active Problems:  Multiple myeloma  Per heme/onc management  Pancytopenia  Secondary to multiple myeloma  WBC count stable at 2.8  Hemoglobin stable at 8.1  Platelet count stable at 101  No signs of active bleed  Will follow up CBC in am   Vasculitis  Continue coumadin per pharmacy  Continue gabapentin  Hypertension  Continue Hctz and irbessartan  Hypokalemia  repleted  Follow up BMP in am  Hyponatremia  Perhaps due to dehydration  Will continue to follow up BMP  Protein calorie malnutrition  Secondary to multiple myeloma  Nutrition consult  Code Status: full code Family Communication: at bedside Disposition Plan: home when stable  [name], MD  [facility] Pager [phone]  If 7PM-7AM, please contact night-coverage [email or web address] Password [email or web address] 09/05/2012, 12:49 PM   LOS: 2 days   Consultants:  Oncology  Procedures:  None   Antibiotics:  Vanco -->  Zosyn discontinue 10/19  Levaquin -->  HPI/Subjective: No acute events overnight.  Objective: Filed Vitals:   09/04/12 2129 09/04/12 2230 09/04/12 2329 09/05/12 0505  BP: 137/74 116/66 127/74 132/67  Pulse: 90 84 79 80  Temp:  101.5 F (38.6 C) 99.8 F (37.7 C) 98.3 F (36.8 C) 97.6 F (36.4 C)  TempSrc: Oral Oral Oral Axillary  Resp: 32 28 28 20   Height:      Weight:      SpO2:    98%    Intake/Output Summary (Last 24 hours) at 09/05/12 1249 Last data filed at 09/05/12 0700  Gross per 24 hour  Intake   2889 ml  Output      0 ml  Net   2889 ml    Exam:   General:  Pt is alert, follows commands appropriately, not in acute distress  Cardiovascular: Regular rate and rhythm, S1/S2, no murmurs, no rubs, no gallops  Respiratory: Clear to auscultation bilaterally, no wheezing, no crackles, no rhonchi  Abdomen: Soft, non tender, non distended, bowel sounds present, no guarding  Extremities: No edema, pulses DP and PT palpable bilaterally  Neuro: Grossly nonfocal  Data Reviewed: Basic Metabolic Panel:  Lab 09/05/12 7253 09/04/12 0330 09/03/12 1426 2012/09/09 1645 08/31/12 1110  NA 130* 132* 131* 130* 135*  K 3.1* 2.9* 3.0* 2.9* 3.8  CL 100 103 99 96 107  CO2 21 19 23 24  20*  GLUCOSE 83 102* 95 114* 89  BUN 9 11 14 11  7.0  CREATININE 0.54 0.51 0.66 0.64 0.7  CALCIUM 8.1* 8.2* 9.2 9.1 9.7   Liver Function Tests:  Lab 09/03/12 1426 09-09-2012 1645 08/31/12 1110  AST 33 18 17  ALT 13 12 14   ALKPHOS 25* 22* 25*  BILITOT 1.2 0.9 0.50  PROT 10.9* 10.3* 10.1*  ALBUMIN 2.8* 2.8* 3.0*   CBC:  Lab 09/05/12 0330 09/04/12  0330 09/03/12 1426 09/02/12 1645 08/31/12 1110  WBC 2.8* 2.9* 5.6 5.3 3.3*  HGB 8.1* 7.4* 8.7* 9.6* 9.7*  HCT 23.3* 21.4* 25.2* 27.8* 27.4*  MCV 90.0 91.5 93.3 92.7 94.5  PLT 101* 94* 128* 135* 149    CULTURE, BLOOD (ROUTINE X 2)     Status: Normal   Collection Time   09/02/12  4:45 PM      Component Value Range Status Comment   Culture     Final    Value: STREPTOCOCCUS PNEUMONIAE   Report Status 09/05/2012 FINAL   Final    Organism ID, Bacteria STREPTOCOCCUS PNEUMONIAE   Final   CULTURE, BLOOD (ROUTINE X 2)     Status: Normal   Collection Time   09/02/12  4:50 PM       Component Value Range Status Comment   Culture     Final    Value: STREPTOCOCCUS PNEUMONIAE   Report Status 09/05/2012 FINAL   Final   URINE CULTURE     Status: Normal   Collection Time   09/02/12  5:05 PM      Component Value Range Status Comment   Specimen Description URINE  Final    Culture NO GROWTH   Final    Report Status 09/03/2012 FINAL   Final   CULTURE, BLOOD (ROUTINE X 2)     Status: Normal (Preliminary result)   Collection Time   09/03/12  2:27 PM      Component Value Range Status Comment   Culture     Final    Value: STREPTOCOCCUS SPECIES   Report Status PENDING   Incomplete   CULTURE, BLOOD (ROUTINE X 2)     Status: Normal (Preliminary result)   Collection Time   09/03/12  2:29 PM      Component Value Range Status Comment   Culture     Final    Value: STREPTOCOCCUS SPECIES   Report Status PENDING   Incomplete   RAPID STREP SCREEN     Status: Normal   Collection Time   09/03/12  3:23 PM      Component Value Range Status Comment   Streptococcus, Group A Screen (Direct) NEGATIVE  NEGATIVE Final      Studies: Dg Chest 2 View 09/04/2012  *IMPRESSION: 1.  New airspace consolidation in the right upper lobe concerning for developing right upper lobe pneumonia.      Ct Head Wo Contrast 09/03/2012  *  IMPRESSION: Lucencies noted throughout the calvarium on prior exam appear to be significantly improved consistent with the given history of multiple myeloma.  No acute intracranial abnormality is noted.      Scheduled Meds:   . feeding supplement  237 mL Oral QPC supper  . gabapentin  300 mg Oral TID  . hydrochlorothiazide  12.5 mg Oral Daily  . irbesartan  300 mg Oral Daily  . piperacillin-tazobactam  3.375 g Intravenous Q8H  . vancomycin  1,000 mg Intravenous Q8H  . warfarin  10 mg Oral Once

## 2012-09-05 NOTE — Progress Notes (Addendum)
ANTIBIOTIC CONSULT NOTE - FOLLOW UP  Pharmacy Consult for Vancomycin, Levaquin Indication: streptococcus bacteremia  Allergies  Allergen Reactions  . Red Dye Anaphylaxis    Lips swollen  3-4 times their baseline size  . Other Other (See Comments)    Strawberries "anything containing red dye"    Patient Measurements: Height: 5\' 3"  (160 cm) Weight: 218 lb 11.1 oz (99.2 kg) IBW/kg (Calculated) : 56.9   Labs:  Basename 09/05/12 0330 09/04/12 0330 09/03/12 1426  WBC 2.8* 2.9* 5.6  HGB 8.1* 7.4* 8.7*  PLT 101* 94* 128*  LABCREA -- -- --  CREATININE 0.54 0.51 0.66   Estimated Creatinine Clearance: 116.6 ml/min (by C-G formula based on Cr of 0.54). 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 Patient returned 10/17 since no improvement in fever and previous blood cx now positive.   Today is Day#3 vancomycin and Zosyn. MD ordered to D/C Zosyn and start Levaquin. Tmax 102.7, WBC low, CrCl > 100 ml/min.  Blood cultures 10/16 with 2/2 positive for strep species, repeat blood cultures 10/17 with same. 10/16 urine cultures negative.  Strep tested sensitive to Levaquin, Ceftriaxone and PCN.  MD would like to continue Vancomycin for now and await final sensitivity of blood culture on 10/17.    Goal of Therapy:  Vancomycin trough level 15-20 mcg/ml Appropriate renal dosing of Levaqui  Plan:   Will d/c Vancomycin trough for now.  Hoping sensitivities will come back in AM and vanc can be discontinued.   Levaquin 750 mg  IV q24h to start now  D/C Zosyn  Geoffry Paradise, PharmD, BCPS Pager: [phone] 9:02 AM Pharmacy #: [id number]

## 2012-09-06 LAB — CULTURE, BLOOD (ROUTINE X 2)

## 2012-09-06 LAB — BASIC METABOLIC PANEL
BUN: 7 mg/dL (ref 6–23)
Chloride: 98 mEq/L (ref 96–112)
Creatinine, Ser: 0.58 mg/dL (ref 0.50–1.35)
GFR calc Af Amer: >90 mL/min (ref >90)
GFR calc non Af Amer: >90 mL/min (ref >90)

## 2012-09-06 LAB — CBC
Hemoglobin: 8.8 g/dL — ABNORMAL LOW (ref 13.0–17.0)
MCH: 31 pg (ref 26.0–34.0)
MCV: 89.1 fL (ref 78.0–100.0)
RBC: 2.84 MIL/uL — ABNORMAL LOW (ref 4.22–5.81)

## 2012-09-06 MED ORDER — WARFARIN SODIUM 5 MG PO TABS
5.0000 mg | ORAL_TABLET | Freq: Once | ORAL | Status: AC
  Administered 2012-09-06: 5 mg via ORAL
  Filled 2012-09-06: qty 1

## 2012-09-06 MED ORDER — POTASSIUM CHLORIDE CRYS ER 20 MEQ PO TBCR
40.0000 meq | EXTENDED_RELEASE_TABLET | Freq: Once | ORAL | Status: AC
  Administered 2012-09-06: 40 meq via ORAL
  Filled 2012-09-06: qty 2

## 2012-09-06 NOTE — Progress Notes (Signed)
[facility] PROGRESS NOTE  [name] NUU:[id number] DOB: [birth year] DOA: 09/03/2012 PCP: [name], MD  Brief narrative: 51 year old male with history of multiple myeloma presented with fevers and found to have strep pneumoniae bacteremia.   Assessment/Plan:   Principal Problem:  *Gram positive bacteremia  Secondary to strep pneumoniae bacteremia  Blood cultures from 09/23/2023 and 10/17 show strep pneumoniae  Rapid strep test negative  Based on sensitivity studies for blood cultures drawn 2023/09/23 Susceptibility is to ceftriaxone and levaquin  Follow up blood culture results for the ones drawn 09/05/2012 Continue Levaquin daily  Active Problems:  Multiple myeloma  Per heme/onc management Pancytopenia  Secondary to multiple myeloma  WBC count stable at 3.2, trending up Hemoglobin stable at 8.8; trending up Platelet count stable at 111; trending up   No signs of active bleed  Will follow up CBC in am Vasculitis  Continue coumadin per pharmacy  Continue gabapentin Hypertension  Continue Hctz and irbessartan Hypokalemia  repleted today again Follow up BMP in am Hyponatremia  Perhaps due to dehydration  Will continue to follow up BMP Protein calorie malnutrition  Secondary to multiple myeloma  Nutrition consulted  Code Status: full code  Family Communication: at bedside  Disposition Plan: home when stable; perhaps in next 48 hours  [name], MD  [facility]  Pager [phone]   Antibiotics:  vanco - discontinued 09/06/2012  Zosyn - discontinued 09/05/2012  Levaquin --> 09/05/12 -->   [name], MD  [facility] Pager [phone]  If 7PM-7AM, please contact night-coverage [email or web address] Password [email or web address] 09/06/2012, 11:40 AM   LOS: 3 days   HPI/Subjective: No acute events overnight.  Objective: Filed Vitals:   09/05/12 0505 09/05/12 1312 09/05/12 2239 09/06/12 0527  BP: 132/67 117/61 151/85 147/83  Pulse: 80 70 97 97  Temp:  97.6 F (36.4 C) 99 F (37.2 C) 99.9 F (37.7 C) 98.8 F (37.1 C)  TempSrc: Axillary Oral Oral Oral  Resp: 20 18 18 18   Height:      Weight:      SpO2: 98% 96% 100% 98%    Intake/Output Summary (Last 24 hours) at 09/06/12 1140 Last data filed at 09/06/12 0513  Gross per 24 hour  Intake   3547 ml  Output      0 ml  Net   3547 ml    Exam:   General:  Pt is alert, follows commands appropriately, not in acute distress  Cardiovascular: Regular rate and rhythm, S1/S2, no murmurs, no rubs, no gallops  Respiratory: Clear to auscultation bilaterally, no wheezing, no crackles, no rhonchi  Abdomen: Soft, non tender, non distended, bowel sounds present, no guarding  Extremities: No edema, pulses DP and PT palpable bilaterally  Neuro: Grossly nonfocal  Data Reviewed: Basic Metabolic Panel:  Lab 09/06/12 4332 09/05/12 0330 09/04/12 0330 09/03/12 1426 Sep 22, 2012 1645  NA 130* 130* 132* 131* 130*  K 3.1* 3.1* 2.9* 3.0* 2.9*  CL 98 100 103 99 96  CO2 22 21 19 23 24   GLUCOSE 88 83 102* 95 114*  BUN 7 9 11 14 11   CREATININE 0.58 0.54 0.51 0.66 0.64  CALCIUM 8.6 8.1* 8.2* 9.2 9.1   Liver Function Tests:  Lab 09/03/12 1426 09/22/12 1645 08/31/12 1110  AST 33 18 17  ALT 13 12 14   ALKPHOS 25* 22* 25*  BILITOT 1.2 0.9 0.50  PROT 10.9* 10.3* 10.1*  ALBUMIN 2.8* 2.8* 3.0*   CBC:  Lab 09/06/12 0410 09/05/12 0330 09/04/12 0330 09/03/12  1426 09/02/12 1645  WBC 3.2* 2.8* 2.9* 5.6 5.3  HGB 8.8* 8.1* 7.4* 8.7* 9.6*  HCT 25.3* 23.3* 21.4* 25.2* 27.8*  MCV 89.1 90.0 91.5 93.3 92.7  PLT 111* 101* 94* 128* 135*    CULTURE, BLOOD (ROUTINE X 2)     Status: Normal   Collection Time   09/02/12  4:45 PM      Component Value Range Status Comment   Culture     Final    Value: STREPTOCOCCUS PNEUMONIAE   Report Status 09/05/2012 FINAL   Final    Organism ID, Bacteria STREPTOCOCCUS PNEUMONIAE   Final   CULTURE, BLOOD (ROUTINE X 2)     Status: Normal   Collection Time   09/02/12  4:50 PM       Component Value Range Status Comment   Culture     Final    Value: STREPTOCOCCUS PNEUMONIAE   Report Status 09/05/2012 FINAL   Final   URINE CULTURE     Status: Normal   Collection Time   09/02/12  5:05 PM      Component Value Range Status Comment   Specimen Description URINE  Final    Culture NO GROWTH   Final    Report Status 09/03/2012 FINAL   Final   CULTURE, BLOOD (ROUTINE X 2)     Status: Normal   Collection Time   09/03/12  2:27 PM      Component Value Range Status Comment   Culture     Final    Value: STREPTOCOCCUS PNEUMONIAE   Report Status 09/06/2012 FINAL   Final   CULTURE, BLOOD (ROUTINE X 2)     Status: Normal   Collection Time   09/03/12  2:29 PM      Component Value Range Status Comment   Culture     Final    Value: STREPTOCOCCUS PNEUMONIAE   Report Status 09/06/2012 FINAL   Final   RAPID STREP SCREEN     Status: Normal   Collection Time   09/03/12  3:23 PM      Component Value Range Status Comment   Streptococcus, Group A Screen (Direct) NEGATIVE  NEGATIVE Final     Scheduled Meds:  . feeding supplement  237 mL Oral QPC supper  . gabapentin  300 mg Oral TID  . hydrochlorothiazide  12.5 mg Oral Daily  . irbesartan  300 mg Oral Daily  . levofloxacin (LEVAQUIN)  750 mg Intravenous Q24H  . potassium chloride  40 mEq Oral Once  . warfarin  5 mg Oral [id number]

## 2012-09-06 NOTE — Progress Notes (Addendum)
ANTICOAGULATION CONSULT NOTE - Follow Up Consult  Pharmacy Consult for Coumadin Indication: vasculitis in pt with h/o MM  Allergies  Allergen Reactions  . Red Dye Anaphylaxis    Lips swollen  3-4 times their baseline size  . Other Other (See Comments)    Strawberries "anything containing red dye"   Labs:  Basename 09/06/12 0410 09/05/12 0330 09/04/12 0805 09/04/12 0330  HGB 8.8* 8.1* -- --  HCT 25.3* 23.3* -- 21.4*  PLT 111* 101* -- 94*  APTT -- -- -- --  LABPROT 20.3* 17.3* 19.8* --  INR 1.81* 1.46 1.75* --  HEPARINUNFRC -- -- -- --  CREATININE 0.58 0.54 -- 0.51  CKTOTAL -- -- -- --  CKMB -- -- -- --  TROPONINI -- -- -- --   Assessment:  51 yo M with h/o recurrent multiple myeloma, admitted with fever. Patient takes chronic coumadin for vasculitis (this is the only indication listed in all past notes).  INR on admission was subtherapeutic.  Home dose = 5 mg on TTSat and 7.5 mg on all other days; last dose reported 10/16.  Of note, coumadin was held for [facility] placement for possible stem cell transplant (but this was cancelled), so coumadin was resumed on 10/10.   INR 1.81 this AM s/p boosted dose yesterday.  H/H low but stable.   No bleeding/complications related to anticoagulation therapy noted.   DDI with Levaquin noted: can potentially increase INR  Goal of Therapy:  INR 2-3 Monitor platelets by anticoagulation protocol: Yes   Plan:   Coumadin 5 mg po tonight  Daily PT/INR  Pharmacy will f/u  Geoffry Paradise, PharmD, BCPS Pager: [phone] 6:58 AM Pharmacy #: [phone]

## 2012-09-07 ENCOUNTER — Ambulatory Visit: Payer: 59

## 2012-09-07 ENCOUNTER — Other Ambulatory Visit: Payer: 59 | Admitting: Lab

## 2012-09-07 ENCOUNTER — Telehealth: Payer: Self-pay | Admitting: *Deleted

## 2012-09-07 LAB — PROTIME-INR: Prothrombin Time: 22.9 seconds — ABNORMAL HIGH (ref 11.6–15.2)

## 2012-09-07 MED ORDER — CEFUROXIME AXETIL 250 MG PO TABS: 250.0000 mg | ORAL_TABLET | Freq: Two times a day (BID) | ORAL | Status: DC

## 2012-09-07 MED ORDER — DEXTROSE 5 % IV SOLN
2.0000 g | INTRAVENOUS | Status: DC
  Administered 2012-09-07: 2 g via INTRAVENOUS
  Filled 2012-09-07: qty 2

## 2012-09-07 MED ORDER — WARFARIN SODIUM 5 MG PO TABS: 5.0000 mg | ORAL_TABLET | Freq: Every day | ORAL | Status: DC

## 2012-09-07 MED ORDER — LEVOFLOXACIN 500 MG PO TABS: 500.0000 mg | ORAL_TABLET | Freq: Every day | ORAL | Status: DC

## 2012-09-07 MED ORDER — HYDROCODONE-ACETAMINOPHEN 10-325 MG PO TABS: 1.0000 | ORAL_TABLET | Freq: Four times a day (QID) | ORAL | Status: DC | PRN

## 2012-09-07 MED ORDER — MORPHINE SULFATE 15 MG PO TABS: 15.0000 mg | ORAL_TABLET | ORAL | Status: DC | PRN

## 2012-09-07 NOTE — Telephone Encounter (Signed)
Form from [place] dept of labor FMLA form signed by Dr Donnald Garre and returned to medical mgmt team.  [name]

## 2012-09-07 NOTE — Discharge Summary (Signed)
Physician Discharge Summary  [name] JXB:[id number] DOB: [birth year] DOA: 09/03/2012  PCP: [name], MD  Admit date: 09/03/2012 Discharge date: 09/07/2012  Recommendations for Outpatient Follow-up:  1. With PCP and oncology per scheduled appointment  Discharge Diagnoses:  Principal Problem:  *Bacteremia Active Problems:  Multiple myeloma  Vasculitis  Chronic anticoagulation  Chronic pain  Hypertension  Discharge Condition: medically stable for discharge home today  Diet recommendation: as tolerated  History of present illness:  51 year old male with history of multiple myeloma presented with fevers and found to have strep pneumoniae bacteremia.   Assessment/Plan:   Principal Problem:  *Gram positive bacteremia  Secondary to strep pneumoniae bacteremia  Blood cultures from 09-10-2023 and 10/17 show strep pneumoniae  Rapid strep test negative  Based on sensitivity studies for blood cultures drawn 09-10-2023 Susceptibility is to ceftriaxone and Levaquin; patient has received 1 dose of rocephin prior to discharge and will be discharged home with cefuroxime for 10 days; please note that this was the recommendation by ID who has reviewed the patient's chart but no official consult requested Follow up results of blood cultures 09/05/2012 are negative to date  Active Problems:  Multiple myeloma  Per heme/onc management Pancytopenia  Secondary to multiple myeloma  WBC count stable at 3.2, trending up  Hemoglobin stable at 8.8; trending up  Platelet count stable at 111; trending up  No signs of active bleed  Vasculitis  Continue coumadin per home regimen  Continue gabapentin Hypertension  Continue Hctz and irbessartan Hypokalemia  repleted prior to discahrge Hyponatremia  Perhaps due to dehydration  Protein calorie malnutrition, morbid obesity Secondary to multiple myeloma  Nutrition consulted  Code Status: full code  Family Communication: at bedside    Disposition Plan: home today; patient insists on going home today  [name], MD  [facility]  Pager [phone]  Antibiotics:  vanco - discontinued 09/06/2012  Zosyn - discontinued 09/05/2012  Levaquin --> 09/05/12 --> 09/07/2012 Rocephin 1 dose given prior to discharge and patient will take additional 10 days of cefuroxime  Discharge Exam: Filed Vitals:   09/07/12 0509  BP: 148/63  Pulse: 74  Temp: 99 F (37.2 C)  Resp: 18   Filed Vitals:   09/06/12 0527 09/06/12 1300 09/06/12 2131 09/07/12 0509  BP: 147/83 159/81 130/75 148/63  Pulse: 97 63 88 74  Temp: 98.8 F (37.1 C) 99.4 F (37.4 C) 98.8 F (37.1 C) 99 F (37.2 C)  TempSrc: Oral  Oral Oral  Resp: 18 16 18 18   Height:      Weight:      SpO2: 98% 98% 97% 99%    General: Pt is alert, follows commands appropriately, not in acute distress Cardiovascular: Regular rate and rhythm, S1/S2 +, no murmurs, no rubs, no gallops Respiratory: Clear to auscultation bilaterally, no wheezing, no crackles, no rhonchi Abdominal: Soft, non tender, non distended, bowel sounds +, no guarding Extremities: no edema, no cyanosis, pulses palpable bilaterally DP and PT Neuro: Grossly nonfocal  Discharge Instructions  Discharge Orders    Future Appointments: Provider: Department: Dept Phone: Center:   09/07/2012 2:30 PM Windell Hummingbird [facility] [phone] None   09/07/2012 3:00 PM [facility] [phone] None   09/07/2012 3:30 PM [facility] Coag [facility] [phone] None   09/08/2012 3:00 PM [facility] [phone] None   09/14/2012 2:45 PM Krista Blue [facility] [phone] None   09/14/2012 3:15 PM Conni Slipper, PA [facility] [phone] None  09/14/2012 4:15 PM [facility] [phone] None   09/15/2012 3:00 PM [facility] E[facility] [phone] None     Future Orders Please Complete By Expires   Diet -  low sodium heart healthy      Increase activity slowly      Discharge instructions      Comments:   Please note that it is very important to follow these instructions: Take coumadin 5 mg at bedtime today and for next 2 days including 09/09/2012 and then check INR with PCP to adjust the dosage as necessary This is important as you are taking antibiotics for bacteremia. Please note that you need to take total of 2 weeks of Levaquin for streptococcal bacteremia   Call MD for:  persistant nausea and vomiting      Call MD for:  severe uncontrolled pain      Call MD for:  difficulty breathing, headache or visual disturbances      Call MD for:  persistant dizziness or light-headedness          Medication List     As of 09/07/2012 11:35 AM    TAKE these medications         acetaminophen 500 MG tablet   Commonly known as: TYLENOL   Take 1,000 mg by mouth every 6 (six) hours as needed. For pain/fever.      cefUROXime 250 MG tablet   Commonly known as: CEFTIN   Take 1 tablet (250 mg total) by mouth 2 (two) times daily.      gabapentin 300 MG capsule   Commonly known as: NEURONTIN   Take 300 mg by mouth 3 (three) times daily.      HYDROcodone-acetaminophen 10-325 MG per tablet   Commonly known as: NORCO   Take 1-2 tablets by mouth every 6 (six) hours as needed for pain. For pain.      irbesartan-hydrochlorothiazide 300-12.5 MG per tablet   Commonly known as: AVALIDE   Take 1 tablet by mouth daily with breakfast.      morphine 15 MG tablet   Commonly known as: MSIR   Take 1 tablet (15 mg total) by mouth every 4 (four) hours as needed for pain. For pain      warfarin 5 MG tablet   Commonly known as: COUMADIN   Take 1-1.5 tablets (5-7.5 mg total) by mouth daily. Takes 5 mg Tuesday, Thursday and Saturday;  Takes 7.5mg  on Monday, Wednesday, Friday and Sunday.           Follow-up Information    Follow up with [name], MD.   Contact information:   [address] [place] [address] [phone]       Follow up with [name], MD. On 09/10/2012. (to recheck INR and adjust coumadin dosage)    Contact information:   [address] [place] [address] [phone]           The results of significant diagnostics from this hospitalization (including imaging, microbiology, ancillary and laboratory) are listed below for reference.    Significant Diagnostic Studies: Dg Chest 2 View  09/04/2012  *RADIOLOGY REPORT*  Clinical Data: Cough.  Bacteremia.  Evaluate for pneumonia.  CHEST - 2 VIEW  Comparison: Chest x-ray 09/02/2012.  Findings: There is a new area of airspace consolidation in the right upper lobe, concerning for right upper lobe pneumonia.  Lungs otherwise appear clear.  No definite pleural effusions.  Pulmonary vasculature and  the cardiomediastinal silhouette are within normal limits.  IMPRESSION: 1.  New airspace consolidation in the right upper lobe concerning for developing right upper lobe pneumonia.   Original Report Authenticated By: Florencia Reasons, M.D.    Dg Chest 2 View  09/02/2012  *RADIOLOGY REPORT*  Clinical Data: Headache, cough, fever  CHEST - 2 VIEW  Comparison: 05/29/2012  Findings: Cardiomediastinal silhouette is stable.  No acute infiltrate or pleural effusion.  No pulmonary edema.  Mild degenerative changes lower thoracic spine.  IMPRESSION: No active disease.  Mild degenerative changes lower thoracic spine.   Original Report Authenticated By: Natasha Mead, M.D.    Ct Head Wo Contrast  09/03/2012  *RADIOLOGY REPORT*  Clinical Data: Headache, altered mental status.  CT HEAD WITHOUT CONTRAST  Technique:  Contiguous axial images were obtained from the base of the skull through the vertex without contrast.  Comparison: May 08, 2007.  Findings: Diffuse lucencies are noted in the calvarium which are improved compared to the prior exam and are consistent with history of multiple  myeloma.  Mild frontal atrophy is noted which is unchanged compared to prior exam.  No mass effect or midline shift is noted.  Ventricular size is within normal limits.  There is no evidence of mass lesion, hemorrhage or acute infarction.  IMPRESSION: Lucencies noted throughout the calvarium on prior exam appear to be significantly improved consistent with the given history of multiple myeloma.  No acute intracranial abnormality is noted.   Original Report Authenticated By: Venita Sheffield., M.D.     Microbiology: Recent Results (from the past 240 hour(s))  CULTURE, BLOOD (ROUTINE X 2)     Status: Normal   Collection Time   09/02/12  4:45 PM      Component Value Range Status Comment   Culture     Final    Value: STREPTOCOCCUS PNEUMONIAE   Report Status 09/05/2012 FINAL   Final   CULTURE, BLOOD (ROUTINE X 2)     Status: Normal   Collection Time   09/02/12  4:50 PM      Component Value Range Status Comment   Culture     Final    Value: STREPTOCOCCUS PNEUMONIAE   Report Status 09/05/2012 FINAL   Final   URINE CULTURE     Status: Normal   Collection Time   09/02/12  5:05 PM      Component Value Range Status Comment   Specimen Description URINE  Final    Culture NO GROWTH   Final    Report Status 09/03/2012 FINAL   Final   CULTURE, BLOOD (ROUTINE X 2)     Status: Normal   Collection Time   09/03/12  2:27 PM      Component Value Range Status Comment   Culture     Final    Value: STREPTOCOCCUS PNEUMONIAE   Report Status 09/06/2012 FINAL   Final   CULTURE, BLOOD (ROUTINE X 2)     Status: Normal   Collection Time   09/03/12  2:29 PM      Component Value Range Status Comment   Culture     Final    Value: STREPTOCOCCUS PNEUMONIAE   Report Status 09/06/2012 FINAL   Final   RAPID STREP SCREEN     Status: Normal   Collection Time   09/03/12  3:23 PM      Component Value Range Status Comment   Streptococcus, Group A Screen (Direct) NEGATIVE  NEGATIVE Final   CULTURE, BLOOD (  ROUTINE X  2)     Status: Normal (Preliminary result)   Collection Time   09/05/12  1:10 PM      Component Value Range Status Comment   Culture     Final    Value:        BLOOD CULTURE RECEIVED NO GROWTH TO DATE    Report Status PENDING   Incomplete   CULTURE, BLOOD (ROUTINE X 2)     Status: Normal (Preliminary result)   Collection Time   09/05/12  1:30 PM      Component Value Range Status Comment   Culture     Final    Value:        BLOOD CULTURE RECEIVED NO GROWTH TO DATE    Report Status PENDING   Incomplete      Labs: Basic Metabolic Panel:  Lab 09/06/12 1610 09/05/12 0330 09/04/12 0330 09/03/12 1426 09/02/12 1645  NA 130* 130* 132* 131* 130*  K 3.1* 3.1* 2.9* 3.0* 2.9*  CL 98 100 103 99 96  CO2 22 21 19 23 24   GLUCOSE 88 83 102* 95 114*  BUN 7 9 11 14 11   CREATININE 0.58 0.54 0.51 0.66 0.64  CALCIUM 8.6 8.1* 8.2* 9.2 9.1   Liver Function Tests:  Lab 09/03/12 1426 09/02/12 1645  AST 33 18  ALT 13 12  ALKPHOS 25* 22*  BILITOT 1.2 0.9  PROT 10.9* 10.3*  ALBUMIN 2.8* 2.8*   CBC:  Lab 09/06/12 0410 09/05/12 0330 09/04/12 0330 09/03/12 1426 09/02/12 1645  WBC 3.2* 2.8* 2.9* 5.6 5.3  HGB 8.8* 8.1* 7.4* 8.7* 9.6*  HCT 25.3* 23.3* 21.4* 25.2* 27.8*  MCV 89.1 90.0 91.5 93.3 92.7  PLT 111* 101* 94* 128* 135*    Time coordinating discharge: Over 30 minutes  Signed:  [name], MD  [facility] 09/07/2012, 11:35 AM  Pager #: [phone]

## 2012-09-07 NOTE — Progress Notes (Signed)
Pt discharged home via family; Pt and family given and explained all discharge instructions, carenotes, and prescriptions; pt and family stated understanding and denied questions/concerns; all f/u appointments in place; IV removed without complicaitons; pt stable at time of discharge  

## 2012-09-07 NOTE — Plan of Care (Signed)
Problem: Food- and Nutrition-Related Knowledge Deficit (NB-1.1) Goal: Nutrition education Formal process to instruct or train a patient/client in a skill or to impart knowledge to help patients/clients voluntarily manage or modify food choices and eating behavior to maintain or improve health.  Outcome: Completed/Met Date Met:  09/07/12 Knowledge deficit resolved with diet education on 10/21  Comments:  Discussed and provided handout obtained form ADA nutrition care manual for oncology nutrition therapy. I have educated the patient on high calorie, high protein diet. I have educated the patient on nutrition symptom management. The patient was with out any further nutrition related questions and verbalized understanding of the nutrition information provided.   RD available for nutrition needs.   Iven Finn [employer] [phone]

## 2012-09-07 NOTE — Progress Notes (Signed)
ANTICOAGULATION CONSULT NOTE - Follow Up Consult  Pharmacy Consult for Coumadin Indication: vasculitis in pt with h/o MM  Allergies  Allergen Reactions  . Red Dye Anaphylaxis    Lips swollen  3-4 times their baseline size  . Other Other (See Comments)    Strawberries "anything containing red dye"   Labs:  Basename 09/07/12 0345 09/06/12 0410 09/05/12 0330  HGB -- 8.8* 8.1*  HCT -- 25.3* 23.3*  PLT -- 111* 101*  APTT -- -- --  LABPROT 22.9* 20.3* 17.3*  INR 2.13* 1.81* 1.46  HEPARINUNFRC -- -- --  CREATININE -- 0.58 0.54  CKTOTAL -- -- --  CKMB -- -- --  TROPONINI -- -- --   Assessment:  51 yo M with h/o recurrent multiple myeloma, admitted with fever. Patient takes chronic coumadin for vasculitis (this is the only indication listed in all past notes).  INR on admission was subtherapeutic.  Home dose = 5 mg on TTSat and 7.5 mg on all other days; last dose reported 10/16.  Of note, coumadin was held for [facility] placement for possible stem cell transplant (but this was cancelled), so coumadin was resumed on 10/10.   INR 2.18 today   No bleeding/complications related to anticoagulation therapy noted.   DDI with Levaquin noted: can potentially increase INR, planned to continue po Levaquin at discharge.  Goal of Therapy:  INR 2-3 Monitor platelets by anticoagulation protocol: Yes   Plan:   Would suggest Coumadin 5 mg po tonight after discharge and then follow INR closely with concurrent Levaquin planned for total two week course. (Discussed with hospitalist; monitor INR q 3 days and dose Warfarin 5mg  daily).  Otho Bellows PharmD Pager: [phone]  09/07/2012 10:59 AM

## 2012-09-07 NOTE — Progress Notes (Signed)
Subjective: Patient is a pleasant 51 year old African American male diagnosed with recurrent multiple myeloma, IgG. kappa subtype in June of 2008, as well as a history of vasculitis of skin lesions related to his multiple myeloma. He is status post multiple therapies including palliative radiotherapy to left hip, status post 5 cycles of systemic chemotherapy with Revlimid and low-dose Decadron, status post autologous peripheral blood stem cell transplant at [facility] on 02/02/2008, he had evidence for disease recurrence in December 2000 and status post 28 cycles of Revlimid 25 mg by mouth daily given for 21 days every 4 weeks in addition to Decadron 40 mg orally on weekly basis was discontinued secondary to disease progression. Status post 3 cycles of systemic chemotherapy with Velcade, Doxil and Decadron discontinued secondary to intolerance on 05/04/2012. Currently receiving Zometa 4 mg IV given every 3 months. He was he was admitted with complaints of high fever with MAXIMUM TEMPERATURE of 104.2 in the [facility] emergency room as well as a nonproductive cough. His admitted and placed on broad-spectrum antibiotics however once the cultures came back with Streptococcus pneumoniae it bacteremia his antibiotics were adjusted accordingly. Today he feels significantly better than the day of admission. He voices no specific complaints today.  Objective: Vital signs in last 24 hours: Temp:  [98.8 F (37.1 C)-99 F (37.2 C)] 99 F (37.2 C) (10/21 0509) Pulse Rate:  [74-88] 74  (10/21 0509) Resp:  [18] 18  (10/21 0509) BP: (130-148)/(63-75) 148/63 mmHg (10/21 0509) SpO2:  [97 %-99 %] 99 % (10/21 0509)  Intake/Output from previous day: 10/20 0701 - 10/21 0700 In: 960 [P.O.:960] Out: -  Intake/Output this shift: Total I/O In: 240 [P.O.:240] Out: -   General appearance: alert, cooperative, appears stated age and no distress Resp: clear to auscultation bilaterally Cardio: regular rate and  rhythm, S1, S2 normal, no murmur, click, rub or gallop GI: soft, non-tender; bowel sounds normal; no masses,  no organomegaly Extremities: extremities normal, atraumatic, no cyanosis or edema  Lab Results:   Basename 09/06/12 0410 09/05/12 0330  WBC 3.2* 2.8*  HGB 8.8* 8.1*  HCT 25.3* 23.3*  PLT 111* 101*   BMET  Basename 09/06/12 0410 09/05/12 0330  NA 130* 130*  K 3.1* 3.1*  CL 98 100  CO2 22 21  GLUCOSE 88 83  BUN 7 9  CREATININE 0.58 0.54  CALCIUM 8.6 8.1*    Studies/Results: No results found.  Medications: I have reviewed the patient's current medications.  Assessment/Plan: The patient is a pleasant 51 year old African American male with recurrent multiple myeloma IgG kappa subtype treated to date as described above. He is currently considering other outpatient chemotherapy options and these will be discussed with Dr. Arbutus Ped when he returns for a followup visit in approximately 2 weeks from discharge. His anticoagulation therapy is followed by the [facility] Coumadin clinic and followup appointments will be scheduled for his continued followup with them on an outpatient basis. Recommend continued broad-spectrum antibiotics per her primary team for Streptococcus pneumoniae bacteremia.   LOS: 4 days    Marlana Salvage 09/07/2012 3:59 PM  Hematology/oncology attending: The patient is seen and examined today. I agree with the above note. He is feeling fine today with no specific complaints. He is afebrile. He is currently on antibiotic treatment with Rocephin for Streptococcus pneumoniae bacteremia. I expect the patient to be discharged from the hospital in the next 1-2 days. He would have a followup appointment with me for further evaluation  and discussion of treatment options for his multiple myeloma. Thank you for taking good care of Mr. Geffre.

## 2012-09-07 NOTE — Telephone Encounter (Signed)
Short Term Disability form also signed by Dr Donnald Garre and given to medical mgmt to review.  SLJ

## 2012-09-08 ENCOUNTER — Encounter: Payer: Self-pay | Admitting: Internal Medicine

## 2012-09-08 ENCOUNTER — Other Ambulatory Visit: Payer: Self-pay | Admitting: Physician Assistant

## 2012-09-08 ENCOUNTER — Ambulatory Visit: Payer: 59

## 2012-09-08 ENCOUNTER — Telehealth: Payer: Self-pay | Admitting: Internal Medicine

## 2012-09-08 DIAGNOSIS — C9000 Multiple myeloma not having achieved remission: Secondary | ICD-10-CM

## 2012-09-08 NOTE — Progress Notes (Signed)
Faxed fmla form and disability form to [employer] @ [phone].

## 2012-09-08 NOTE — Telephone Encounter (Signed)
pt called with appts

## 2012-09-11 ENCOUNTER — Ambulatory Visit: Payer: 59

## 2012-09-11 ENCOUNTER — Other Ambulatory Visit: Payer: 59 | Admitting: Lab

## 2012-09-11 LAB — CULTURE, BLOOD (ROUTINE X 2): Culture: NO GROWTH

## 2012-09-14 ENCOUNTER — Other Ambulatory Visit (HOSPITAL_BASED_OUTPATIENT_CLINIC_OR_DEPARTMENT_OTHER): Payer: 59 | Admitting: Lab

## 2012-09-14 ENCOUNTER — Ambulatory Visit (HOSPITAL_BASED_OUTPATIENT_CLINIC_OR_DEPARTMENT_OTHER): Payer: 59 | Admitting: Pharmacist

## 2012-09-14 ENCOUNTER — Ambulatory Visit: Payer: 59

## 2012-09-14 ENCOUNTER — Ambulatory Visit (HOSPITAL_BASED_OUTPATIENT_CLINIC_OR_DEPARTMENT_OTHER): Payer: 59 | Admitting: Physician Assistant

## 2012-09-14 ENCOUNTER — Other Ambulatory Visit: Payer: Self-pay | Admitting: Medical Oncology

## 2012-09-14 DIAGNOSIS — C9000 Multiple myeloma not having achieved remission: Secondary | ICD-10-CM

## 2012-09-14 DIAGNOSIS — D649 Anemia, unspecified: Secondary | ICD-10-CM

## 2012-09-14 DIAGNOSIS — I776 Arteritis, unspecified: Secondary | ICD-10-CM

## 2012-09-14 DIAGNOSIS — G609 Hereditary and idiopathic neuropathy, unspecified: Secondary | ICD-10-CM

## 2012-09-14 LAB — CBC WITH DIFFERENTIAL/PLATELET
EOS%: 0.8 % (ref 0.0–7.0)
Eosinophils Absolute: 0 10*3/uL (ref 0.0–0.5)
LYMPH%: 26.6 % (ref 14.0–49.0)
MCH: 31 pg (ref 27.2–33.4)
MCHC: 33.7 g/dL (ref 32.0–36.0)
MCV: 92 fL (ref 79.3–98.0)
MONO%: 8.2 % (ref 0.0–14.0)
NEUT#: 2.5 10*3/uL (ref 1.5–6.5)
Platelets: 122 10*3/uL — ABNORMAL LOW (ref 140–400)
RBC: 2.74 10*6/uL — ABNORMAL LOW (ref 4.20–5.82)
RDW: 16 % — ABNORMAL HIGH (ref 11.0–14.6)
nRBC: 0 % (ref 0–0)

## 2012-09-14 LAB — PROTIME-INR: Protime: 36 Seconds — ABNORMAL HIGH (ref 10.6–13.4)

## 2012-09-14 LAB — POCT INR: INR: 3

## 2012-09-14 NOTE — Telephone Encounter (Signed)
gv and printed pt appt schedule for OCT and NOV

## 2012-09-14 NOTE — Telephone Encounter (Signed)
Printed and gv pt appt schedule to OCT and NOV

## 2012-09-14 NOTE — Patient Instructions (Addendum)
Complete your course of antibiotics as scheduled Notify us when you receive your Palmayst Your therapy will consist of a Palmayst 4 mg by mouth for 21 days every 28 days, Cytoxan 50 mg by mouth every other day, and dexamethasone 40 mg by mouth once weekly Will followup in 3 weeks for a symptom management visit

## 2012-09-14 NOTE — Telephone Encounter (Signed)
Printed and gv appt schedule to pt. For OCT and NOV

## 2012-09-14 NOTE — Telephone Encounter (Signed)
Printed and gv pt appt schedule for OCT and NOV °

## 2012-09-14 NOTE — Telephone Encounter (Signed)
gv and printed appt schedule for pt for OCT and NOV °

## 2012-09-14 NOTE — Patient Instructions (Addendum)
Continue 7.5mg  daily except 5mg  on Tuesday, Thursday, and Saturday.   Recheck INR with next scheduled lab/MD appointment on 09/22/12; lab at 11:45am, Dr. Arbutus Ped at 12pm and Coumadin clinic at 12:15pm.

## 2012-09-14 NOTE — Progress Notes (Signed)
Continue 7.5mg daily except 5mg on Tuesday, Thursday, and Saturday.   Recheck INR with next scheduled lab/MD appointment on 09/22/12; lab at 11:45am, Dr. Mohamed at 12pm and Coumadin clinic at 12:15pm. 

## 2012-09-15 ENCOUNTER — Ambulatory Visit: Payer: 59

## 2012-09-15 ENCOUNTER — Telehealth: Payer: Self-pay | Admitting: Internal Medicine

## 2012-09-15 ENCOUNTER — Other Ambulatory Visit: Payer: Self-pay | Admitting: *Deleted

## 2012-09-15 DIAGNOSIS — C9000 Multiple myeloma not having achieved remission: Secondary | ICD-10-CM

## 2012-09-15 MED ORDER — POMALIDOMIDE 4 MG PO CAPS: 4.0000 mg | ORAL_CAPSULE | Freq: Every day | ORAL | Status: DC

## 2012-09-15 NOTE — Telephone Encounter (Signed)
Printed and gv pt appt for OCT and NOV °

## 2012-09-15 NOTE — Telephone Encounter (Signed)
Pt registered at [employer]

## 2012-09-15 NOTE — Progress Notes (Signed)
[facility] [phone]   [phone]  OFFICE PROGRESS NOTE  [name], MD [address] [place] [address]  Principle Diagnosis: #1 recurrent multiple myeloma IgG kappa subtype diagnosed in June of 2008 #2 history of vasculitis and thrombosis of skin lesions   Prior Therapy: #1 status post palliative radiotherapy to the left hip under the care of Dr. Roselind Messier  #2 status post 5 cycles of systemic chemotherapy with Revlimid and low dose Decadron. Last dose given June 2009 with good response.  #3 status post autologous peripheral blood stem cell transplant at [facility] on 02/02/2008.  #4 the patient had evidence for disease recurrence in December 2010.  #5 Revlimid 25 mg by mouth daily for 21 days every 4 weeks in addition to Decadron 40 mg orally on a weekly basis. The patient is status post 28 cycles, discontinued today secondary to disease progression.  #6 Systemic chemotherapy with Velcade 1.3 mg/M2 on days 1, 4, 8 and 11 in addition to Doxil 30 mg/M2 on day 4 and Decadron 40 mg by mouth on weekly basis every 3 weeks. Status post 3 cycles, last dose was given 05/04/2012 discontinued secondary to intolerance.   Current therapy:  Zometa 4 mg IV given every 3 months. Patient has opted to proceed with salvage therapy treatment with the Pomalystt 4 mg by mouth daily for 21 days every 28 days as well as Cytoxan 50 mg by mouth every other day and dexamethasone 40 mg by mouth once weekly  INTERVAL HISTORY: NICOLIS BOODY 51 y.o. male returns to the clinic today for followup visit accompanied by his wife. The patient is feeling fine today with no specific complaints except for the peripheral neuropathy and he is currently on Neurontin 300 mg by mouth 3 times a day. He reports that he is still on antibiotics from his recent hospital admission. He is scheduled to complete those antibiotics either Friday, November 1 her  Saturday, November 2. He denies any further temperature elevations. He is decided to proceed with treatment with Pomalyst, Cytoxan and dexamethasone. He's picked up the prescription is for dexamethasone and Cytoxan. He has not received the prescription for Pomalyst as yet. He voiced no other specific complaints today.   MEDICAL HISTORY: Past Medical History  Diagnosis Date  . Multiple myeloma(203.0) 09/24/2011  . Hypertension     ALLERGIES:  is allergic to red dye and other.  MEDICATIONS:  Current Outpatient Prescriptions  Medication Sig Dispense Refill  . acetaminophen (TYLENOL) 500 MG tablet Take 1,000 mg by mouth every 6 (six) hours as needed. For pain/fever.      . cefUROXime (CEFTIN) 250 MG tablet Take 1 tablet (250 mg total) by mouth 2 (two) times daily.  20 tablet  0  . cyclophosphamide (CYTOXAN) 50 MG tablet       . dexamethasone (DECADRON) 4 MG tablet       . gabapentin (NEURONTIN) 300 MG capsule Take 300 mg by mouth 3 (three) times daily.      [name] HYDROcodone-acetaminophen (NORCO) 10-325 MG per tablet Take 1-2 tablets by mouth every 6 (six) hours as needed for pain. For pain.  45 tablet  0  . irbesartan-hydrochlorothiazide (AVALIDE) 300-12.5 MG per tablet Take 1 tablet by mouth daily with breakfast.      . morphine (MSIR) 15 MG tablet Take 1 tablet (15 mg total) by mouth every 4 (four) hours as needed for pain. For pain  30  tablet  0  . omeprazole (PRILOSEC) 40 MG capsule       . pomalidomide (POMALYST) 4 MG capsule Take 1 capsule (4 mg total) by mouth daily. Take with water on days 1-21. Repeat every 28 days.   AUTH # [id number]  21 capsule  0  . warfarin (COUMADIN) 5 MG tablet Take 1-1.5 tablets (5-7.5 mg total) by mouth daily. Takes 5 mg Tuesday, Thursday and Saturday;  Takes 7.5mg  on Monday, Wednesday, Friday and Sunday.  30 tablet  0    REVIEW OF SYSTEMS:  A comprehensive review of systems was negative except for: Neurological: positive for paresthesia   PHYSICAL EXAMINATION:  General appearance: alert, cooperative and no distress Head: Normocephalic, without obvious abnormality, atraumatic Neck: no adenopathy Lymph nodes: Cervical, supraclavicular, and axillary nodes normal. Resp: clear to auscultation bilaterally Cardio: regular rate and rhythm, S1, S2 normal, no murmur, click, rub or gallop GI: soft, non-tender; bowel sounds normal; no masses,  no organomegaly Extremities: extremities normal, atraumatic, no cyanosis or edema Neurologic: Alert and oriented X 3, normal strength and tone. Normal symmetric reflexes. Normal coordination and gait  ECOG PERFORMANCE STATUS: 1 - Symptomatic but completely ambulatory  There were no vitals taken for this visit.  LABORATORY DATA: Lab Results  Component Value Date   WBC 3.9* 09/14/2012   HGB 8.5* 09/14/2012   HCT 25.2* 09/14/2012   MCV 92.0 09/14/2012   PLT 122* 09/14/2012      Chemistry      Component Value Date/Time   NA 130* 09/06/2012 0410   NA 135* 08/31/2012 1110   K 3.1* 09/06/2012 0410   K 3.8 08/31/2012 1110   CL 98 09/06/2012 0410   CL 107 08/31/2012 1110   CO2 22 09/06/2012 0410   CO2 20* 08/31/2012 1110   BUN 7 09/06/2012 0410   BUN 7.0 08/31/2012 1110   CREATININE 0.58 09/06/2012 0410   CREATININE 0.7 08/31/2012 1110      Component Value Date/Time   CALCIUM 8.6 09/06/2012 0410   CALCIUM 9.7 08/31/2012 1110   ALKPHOS 25* 09/03/2012 1426   ALKPHOS 25* 08/31/2012 1110   AST 33 09/03/2012 1426   AST 17 08/31/2012 1110   ALT 13 09/03/2012 1426   ALT 14 08/31/2012 1110   BILITOT 1.2 09/03/2012 1426   BILITOT 0.50 08/31/2012 1110       RADIOGRAPHIC STUDIES: Ct Biopsy  08/06/2012  *RADIOLOGY REPORT*  CT GUIDED RIGHT ILIAC BONE MARROW ASPIRATION AND BONE MARROW CORE BIOPSIES  Date:  Clinical History:  51 year old male with a history of recurrent multiple myeloma  Procedures Performed:  1. CT guided bone marrow aspiration and core biopsy  Interventional Radiologist:  Sterling Big,  MD  Sedation: Moderate (conscious) sedation was used.  Four mg Versed, 200 mcg Fentanyl were administered intravenously.  The patient's vital signs were monitored continuously by radiology nursing throughout the procedure.  Sedation Time: 15 minutes  PROCEDURE/FINDINGS:  Informed consent was obtained from the patient following explanation of the procedure, risks, benefits and alternatives. The patient understands, agrees and consents for the procedure. All questions were addressed.  A time out was performed.  The patient was positioned prone and noncontrast localization CT was performed of the pelvis to demonstrate the iliac marrow spaces.  Maximal barrier sterile technique utilized including caps, mask, sterile gowns, sterile gloves, large sterile drape, hand hygiene, and betadine prep.  Under sterile conditions and local anesthesia, an 11 gauge coaxial bone biopsy needle was advanced into the right iliac  marrow space. Needle position was confirmed with CT imaging. Initially, bone marrow aspiration was performed. Next, the 11 gauge outer cannula was utilized to obtain a right iliac bone marrow core biopsy. Needle was removed. Hemostasis was obtained with compression. The patient tolerated the procedure well. Samples were prepared with the cytotechnologist. No immediate complications.  IMPRESSION:  CT guided right iliac bone marrow aspiration and core biopsy.  Signed,  Sterling Big, MD Vascular & Interventional Radiologist [facility]   Original Report Authenticated By: Vilma Prader     ASSESSMENT/PLAN: This is a very pleasant 51 years old [place] American male with progressive multiple myeloma. He status post several chemotherapy regimens most recently treated with Velcade, Doxil and Decadron which was discontinued secondary to intolerance with significant peripheral neuropathy and the patient also has evidence for disease progression on his recent bloodwork done at [facility] as well as a bone  marrow biopsy and aspirate. Patient was discussed with Dr. Arbutus Ped. He is advised to complete his course of antibiotics as scheduled. We'll postpone initiating treatment with the Pomalyst, Cytoxan and dexamethasone until he has completed his course of antibiotics. The patient is to notify us when he receives his prescription of Pomalyst and we will have him return in 3 weeks for a symptom management visit regarding any side effects he may be experiencing with the Pomalyst, Cytoxan and dexamethasone. He will continue on treatment with Zometa every 3 months.  Laural Benes, Salomon Ganser E, PA-C   All questions were answered. The patient knows to call the clinic with any problems, questions or concerns. We can certainly see the patient much sooner if necessary.  I spent 20 minutes counseling the patient face to face. The total time spent in the appointment was 30 minutes.

## 2012-09-21 ENCOUNTER — Encounter: Payer: Self-pay | Admitting: *Deleted

## 2012-09-21 ENCOUNTER — Telehealth: Payer: Self-pay | Admitting: Medical Oncology

## 2012-09-21 ENCOUNTER — Encounter: Payer: Self-pay | Admitting: Internal Medicine

## 2012-09-21 NOTE — Progress Notes (Signed)
[employer], [id number], approved pomalyst 4mg  21 tabs from 09/21/12-05/21/13 [id number].

## 2012-09-21 NOTE — Progress Notes (Signed)
RECEIVED A FAX FROM [employer] CONCERNING A PRIOR AUTHORIZATION FOR POMALYST. THIS REQUEST WAS PLACED IN THE MANAGED CARE BIN.

## 2012-09-21 NOTE — Telephone Encounter (Signed)
Left message to call back and let me know if he received pomalyst and start date

## 2012-09-22 ENCOUNTER — Ambulatory Visit: Payer: 59 | Admitting: Pharmacist

## 2012-09-22 ENCOUNTER — Ambulatory Visit (HOSPITAL_BASED_OUTPATIENT_CLINIC_OR_DEPARTMENT_OTHER): Payer: 59 | Admitting: Internal Medicine

## 2012-09-22 ENCOUNTER — Telehealth: Payer: Self-pay | Admitting: Internal Medicine

## 2012-09-22 ENCOUNTER — Other Ambulatory Visit (HOSPITAL_BASED_OUTPATIENT_CLINIC_OR_DEPARTMENT_OTHER): Payer: 59

## 2012-09-22 VITALS — BP 139/86 | HR 86 | Temp 97.5°F | Resp 20 | Ht 63.0 in | Wt 225.1 lb

## 2012-09-22 DIAGNOSIS — C9000 Multiple myeloma not having achieved remission: Secondary | ICD-10-CM

## 2012-09-22 DIAGNOSIS — I776 Arteritis, unspecified: Secondary | ICD-10-CM

## 2012-09-22 LAB — CBC WITH DIFFERENTIAL/PLATELET
BASO%: 0.3 % (ref 0.0–2.0)
EOS%: 1.4 % (ref 0.0–7.0)
Eosinophils Absolute: 0 10*3/uL (ref 0.0–0.5)
LYMPH%: 26.2 % (ref 14.0–49.0)
MCH: 33 pg (ref 27.2–33.4)
MCHC: 34.7 g/dL (ref 32.0–36.0)
MCV: 95 fL (ref 79.3–98.0)
MONO%: 16.4 % — ABNORMAL HIGH (ref 0.0–14.0)
NEUT#: 1.8 10*3/uL (ref 1.5–6.5)
Platelets: 134 10*3/uL — ABNORMAL LOW (ref 140–400)
RBC: 2.85 10*6/uL — ABNORMAL LOW (ref 4.20–5.82)
RDW: 17 % — ABNORMAL HIGH (ref 11.0–14.6)

## 2012-09-22 LAB — COMPREHENSIVE METABOLIC PANEL (CC13)
Albumin: 2.9 g/dL — ABNORMAL LOW (ref 3.5–5.0)
BUN: 8 mg/dL (ref 7.0–26.0)
CO2: 25 mEq/L (ref 22–29)
Glucose: 96 mg/dl (ref 70–99)
Sodium: 134 mEq/L — ABNORMAL LOW (ref 136–145)
Total Bilirubin: 0.6 mg/dL (ref 0.20–1.20)
Total Protein: 10.7 g/dL — ABNORMAL HIGH (ref 6.4–8.3)

## 2012-09-22 LAB — PROTIME-INR: INR: 3.5 (ref 2.00–3.50)

## 2012-09-22 LAB — LACTATE DEHYDROGENASE (CC13): LDH: 320 U/L — ABNORMAL HIGH (ref 125–220)

## 2012-09-22 NOTE — Telephone Encounter (Signed)
Gave pt appt for lab, ML and coumadin clinic for November 2013

## 2012-09-22 NOTE — Progress Notes (Signed)
[facility] [phone]   Fax:([phone]  OFFICE PROGRESS NOTE  [name], MD [address] [place] [address]  Principle Diagnosis: #1 recurrent multiple myeloma IgG kappa subtype diagnosed in June of 2008 #2 history of vasculitis and thrombosis of skin lesions   Prior Therapy: #1 status post palliative radiotherapy to the left hip under the care of Dr. Roselind Messier  #2 status post 5 cycles of systemic chemotherapy with Revlimid and low dose Decadron. Last dose given June 2009 with good response.  #3 status post autologous peripheral blood stem cell transplant at [facility] on 02/02/2008.  #4 the patient had evidence for disease recurrence in December 2010.  #5 Revlimid 25 mg by mouth daily for 21 days every 4 weeks in addition to Decadron 40 mg orally on a weekly basis. The patient is status post 28 cycles, discontinued today secondary to disease progression.  #6 Systemic chemotherapy with Velcade 1.3 mg/M2 on days 1, 4, 8 and 11 in addition to Doxil 30 mg/M2 on day 4 and Decadron 40 mg by mouth on weekly basis every 3 weeks. Status post 3 cycles, last dose was given 05/04/2012 discontinued secondary to intolerance.   Current therapy:  Zometa 4 mg IV given every 3 months.  Patient has opted to proceed with salvage therapy treatment with the Pomalyst 4 mg by mouth daily for 21 days every 28 days as well as Cytoxan 50 mg by mouth every other day and dexamethasone 40 mg by mouth once weekly   INTERVAL HISTORY: [name] 51 y.o. male returns to the clinic today for routine followup visit. The patient is feeling fine today with no specific complaints. He has not started yet his treatment with Pomalyst, Cytoxan and Decadron. He is expected to receive the Pomalyst tomorrow. He denied having any significant fever or chills today. He denied having any nausea or vomiting. He denied having any significant chest pain, shortness  breath, cough or hemoptysis.  MEDICAL HISTORY: Past Medical History  Diagnosis Date  . Multiple myeloma(203.0) 09/24/2011  . Hypertension     ALLERGIES:  is allergic to red dye and other.  MEDICATIONS:  Current Outpatient Prescriptions  Medication Sig Dispense Refill  . acetaminophen (TYLENOL) 500 MG tablet Take 1,000 mg by mouth every 6 (six) hours as needed. For pain/fever.      . cefUROXime (CEFTIN) 250 MG tablet Take 1 tablet (250 mg total) by mouth 2 (two) times daily.  20 tablet  0  . cyclophosphamide (CYTOXAN) 50 MG tablet       . dexamethasone (DECADRON) 4 MG tablet       . gabapentin (NEURONTIN) 300 MG capsule Take 300 mg by mouth 3 (three) times daily.      [name] HYDROcodone-acetaminophen (NORCO) 10-325 MG per tablet Take 1-2 tablets by mouth every 6 (six) hours as needed for pain. For pain.  45 tablet  0  . irbesartan-hydrochlorothiazide (AVALIDE) 300-12.5 MG per tablet Take 1 tablet by mouth daily with breakfast.      . morphine (MSIR) 15 MG tablet Take 1 tablet (15 mg total) by mouth every 4 (four) hours as needed for pain. For pain  30 tablet  0  . omeprazole (PRILOSEC) 40 MG capsule       . pomalidomide (POMALYST) 4 MG capsule Take 1 capsule (4 mg total) by mouth daily. Take with water on days 1-21. Repeat every 28 days.   AUTH # [id number]  21 capsule  0  . warfarin (COUMADIN) 5 MG tablet Take 1-1.5 tablets (5-7.5 mg total) by mouth daily. Takes 5 mg Tuesday, Thursday and Saturday;  Takes 7.5mg  on Monday, Wednesday, Friday and Sunday.  30 tablet  0    REVIEW OF SYSTEMS:  A comprehensive review of systems was negative.   PHYSICAL EXAMINATION: General appearance: alert, cooperative and no distress Head: Normocephalic, without obvious abnormality, atraumatic Neck: no adenopathy Lymph nodes: Cervical, supraclavicular, and axillary nodes normal. Resp: clear to auscultation bilaterally Cardio: regular rate and rhythm, S1, S2 normal, no murmur, click, rub or gallop GI: soft,  non-tender; bowel sounds normal; no masses,  no organomegaly Extremities: extremities normal, atraumatic, no cyanosis or edema Neurologic: Alert and oriented X 3, normal strength and tone. Normal symmetric reflexes. Normal coordination and gait  ECOG PERFORMANCE STATUS: 1 - Symptomatic but completely ambulatory  There were no vitals taken for this visit.  LABORATORY DATA: Lab Results  Component Value Date   WBC 3.3* 09/22/2012   HGB 9.4* 09/22/2012   HCT 27.1* 09/22/2012   MCV 95.0 09/22/2012   PLT 134* 09/22/2012      Chemistry      Component Value Date/Time   NA 130* 09/06/2012 0410   NA 135* 08/31/2012 1110   K 3.1* 09/06/2012 0410   K 3.8 08/31/2012 1110   CL 98 09/06/2012 0410   CL 107 08/31/2012 1110   CO2 22 09/06/2012 0410   CO2 20* 08/31/2012 1110   BUN 7 09/06/2012 0410   BUN 7.0 08/31/2012 1110   CREATININE 0.58 09/06/2012 0410   CREATININE 0.7 08/31/2012 1110      Component Value Date/Time   CALCIUM 8.6 09/06/2012 0410   CALCIUM 9.7 08/31/2012 1110   ALKPHOS 25* 09/03/2012 1426   ALKPHOS 25* 08/31/2012 1110   AST 33 09/03/2012 1426   AST 17 08/31/2012 1110   ALT 13 09/03/2012 1426   ALT 14 08/31/2012 1110   BILITOT 1.2 09/03/2012 1426   BILITOT 0.50 08/31/2012 1110       RADIOGRAPHIC STUDIES: Dg Chest 2 View  09/04/2012  *RADIOLOGY REPORT*  Clinical Data: Cough.  Bacteremia.  Evaluate for pneumonia.  CHEST - 2 VIEW  Comparison: Chest x-ray 09/02/2012.  Findings: There is a new area of airspace consolidation in the right upper lobe, concerning for right upper lobe pneumonia.  Lungs otherwise appear clear.  No definite pleural effusions.  Pulmonary vasculature and the cardiomediastinal silhouette are within normal limits.  IMPRESSION: 1.  New airspace consolidation in the right upper lobe concerning for developing right upper lobe pneumonia.   Original Report Authenticated By: Florencia Reasons, M.D.    Dg Chest 2 View  09/02/2012  *RADIOLOGY REPORT*   Clinical Data: Headache, cough, fever  CHEST - 2 VIEW  Comparison: 05/29/2012  Findings: Cardiomediastinal silhouette is stable.  No acute infiltrate or pleural effusion.  No pulmonary edema.  Mild degenerative changes lower thoracic spine.  IMPRESSION: No active disease.  Mild degenerative changes lower thoracic spine.   Original Report Authenticated By: Natasha Mead, M.D.    Ct Head Wo Contrast  09/03/2012  *RADIOLOGY REPORT*  Clinical Data: Headache, altered mental status.  CT HEAD WITHOUT CONTRAST  Technique:  Contiguous axial images were obtained from the base of the skull through the vertex without contrast.  Comparison: May 08, 2007.  Findings: Diffuse lucencies are noted in the calvarium which are improved compared to the prior exam and are consistent with history of multiple myeloma.  Mild frontal atrophy is noted which is unchanged compared to prior exam.  No mass effect or midline shift is noted.  Ventricular size is within normal limits.  There is no evidence of mass lesion, hemorrhage or acute infarction.  IMPRESSION: Lucencies noted throughout the calvarium on prior exam appear to be significantly improved consistent with the given history of multiple myeloma.  No acute intracranial abnormality is noted.   Original Report Authenticated By: Venita Sheffield., M.D.     ASSESSMENT: This is a very pleasant 51 years old [place] American male with history of progressive multiple myeloma.  PLAN: The patient will start the first dose of his salvage chemotherapy with Pomalyst, Cytoxan and Decadron tomorrow. I discussed with the patient adverse effect of this treatment again. He would come back for followup visit in 2 weeks for reevaluation and management any adverse effect of his chemotherapy. He will continue on Coumadin with the current dose. The patient was advised to call immediately if he has any concerning symptoms in the interval.  All questions were answered. The patient knows to call the  clinic with any problems, questions or concerns. We can certainly see the patient much sooner if necessary.

## 2012-09-22 NOTE — Patient Instructions (Addendum)
Hold Coumadin today.  On 09/23/12, resume Coumadin 7.5mg  daily except 5mg  on TThuSat.  Recheck INR with next schedule lab on 10/05/12: lab at 8:45am, coumadin clinic at 9am and [facility] apt at 9:15am.

## 2012-09-22 NOTE — Progress Notes (Signed)
Pt seen by Dr. Arbutus Ped today.  Pt likely to start Pomalyst, Cytoxan and Dexamethasone later this week. No drug interactions listed between Coumadin and Pomalyst. Cytoxan may increase or decrease INR. Dexamethasone likely to  increase the INR. Will recheck INR 7 - 10days after starting these medications. May need to adjust Coumadin dose since starting these medications.  INR slightly above goal today. Possibly elevated secondary to recent antibiotic treatment (Ceftin for 10 days.) Hold Coumadin today.  On 09/23/12, resume Coumadin 7.5mg  daily except 5mg  on TThuSat. Recheck INR with next schedule lab on 10/05/12: lab at 8:45am, coumadin clinic at 9am and [facility] apt at 9:15am.

## 2012-10-05 ENCOUNTER — Ambulatory Visit (HOSPITAL_BASED_OUTPATIENT_CLINIC_OR_DEPARTMENT_OTHER): Payer: 59 | Admitting: Pharmacist

## 2012-10-05 ENCOUNTER — Ambulatory Visit: Payer: 59 | Admitting: Lab

## 2012-10-05 ENCOUNTER — Ambulatory Visit (HOSPITAL_BASED_OUTPATIENT_CLINIC_OR_DEPARTMENT_OTHER): Payer: 59 | Admitting: Physician Assistant

## 2012-10-05 ENCOUNTER — Encounter: Payer: Self-pay | Admitting: Physician Assistant

## 2012-10-05 ENCOUNTER — Encounter (HOSPITAL_COMMUNITY)
Admission: RE | Admit: 2012-10-05 | Discharge: 2012-10-05 | Disposition: A | Discharge status: Home / Self Care | Payer: 59 | Source: Ambulatory Visit | Attending: Internal Medicine | Admitting: Internal Medicine

## 2012-10-05 ENCOUNTER — Telehealth: Payer: Self-pay | Admitting: Internal Medicine

## 2012-10-05 ENCOUNTER — Other Ambulatory Visit: Payer: 59 | Admitting: Lab

## 2012-10-05 VITALS — BP 128/69 | HR 76 | Temp 98.1°F | Resp 18 | Ht 63.0 in | Wt 232.1 lb

## 2012-10-05 DIAGNOSIS — I776 Arteritis, unspecified: Secondary | ICD-10-CM

## 2012-10-05 DIAGNOSIS — D6481 Anemia due to antineoplastic chemotherapy: Secondary | ICD-10-CM

## 2012-10-05 DIAGNOSIS — T451X5A Adverse effect of antineoplastic and immunosuppressive drugs, initial encounter: Secondary | ICD-10-CM | POA: Insufficient documentation

## 2012-10-05 DIAGNOSIS — C9000 Multiple myeloma not having achieved remission: Secondary | ICD-10-CM

## 2012-10-05 DIAGNOSIS — C9002 Multiple myeloma in relapse: Secondary | ICD-10-CM

## 2012-10-05 LAB — CBC WITH DIFFERENTIAL/PLATELET
BASO%: 0.3 % (ref 0.0–2.0)
EOS%: 3 % (ref 0.0–7.0)
MCH: 31.3 pg (ref 27.2–33.4)
MCHC: 33.3 g/dL (ref 32.0–36.0)
MONO#: 0.5 10*3/uL (ref 0.1–0.9)
RDW: 17.8 % — ABNORMAL HIGH (ref 11.0–14.6)
WBC: 3.1 10*3/uL — ABNORMAL LOW (ref 4.0–10.3)
lymph#: 0.8 10*3/uL — ABNORMAL LOW (ref 0.9–3.3)
nRBC: 2 % — ABNORMAL HIGH (ref 0–0)

## 2012-10-05 LAB — COMPREHENSIVE METABOLIC PANEL (CC13)
ALT: 20 U/L (ref 0–55)
AST: 11 U/L (ref 5–34)
Albumin: 2.5 g/dL — ABNORMAL LOW (ref 3.5–5.0)
Alkaline Phosphatase: 24 U/L — ABNORMAL LOW (ref 40–150)
Potassium: 3.1 mEq/L — ABNORMAL LOW (ref 3.5–5.1)
Sodium: 135 mEq/L — ABNORMAL LOW (ref 136–145)
Total Protein: 8.4 g/dL — ABNORMAL HIGH (ref 6.4–8.3)

## 2012-10-05 LAB — PROTIME-INR
INR: 4.4 — ABNORMAL HIGH (ref 2.00–3.50)
Protime: 52.8 Seconds — ABNORMAL HIGH (ref 10.6–13.4)

## 2012-10-05 LAB — POCT INR: INR: 4.4

## 2012-10-05 MED ORDER — WARFARIN SODIUM 5 MG PO TABS: ORAL_TABLET | ORAL | Status: DC

## 2012-10-05 NOTE — Telephone Encounter (Signed)
appts made and printed for pt aom °

## 2012-10-05 NOTE — Patient Instructions (Addendum)
Your hemoglobin is low and you will require transfusion of 2 units packed red blood cells. This has been scheduled for 10/06/2012. Continue your current doses of Pomalyst, Cytoxan, and Decadron Followup in 2 weeks for another symptom management visit with a repeat labs

## 2012-10-05 NOTE — Progress Notes (Signed)
INR = 4.4 on 7.5 mg/day; 5 mg Tu/Th/Sat Pt off abx.  On CTX + Dex (weekly Dex) + Pomalyst No adverse effects re: anticoagulation or chemo. Pt feels the best he has felt in a while. INR supratherapeutic; likely from Dex. Hold x 1 dose of Coumadin today then reduce overall dose to 5 mg/day; 7.5 mg MWF Pt understands plan & will change out his pill box today. Return next Monday for repeat INR. Marily Lente, Pharm.D.

## 2012-10-05 NOTE — Progress Notes (Signed)
[facility] [phone]   Fax:([phone]  OFFICE PROGRESS NOTE  [name], MD [address] [place] [address]  Principle Diagnosis: #1 recurrent multiple myeloma IgG kappa subtype diagnosed in June of 2008 #2 history of vasculitis and thrombosis of skin lesions   Prior Therapy: #1 status post palliative radiotherapy to the left hip under the care of Dr. Roselind Messier  #2 status post 5 cycles of systemic chemotherapy with Revlimid and low dose Decadron. Last dose given June 2009 with good response.  #3 status post autologous peripheral blood stem cell transplant at [facility] on 02/02/2008.  #4 the patient had evidence for disease recurrence in December 2010.  #5 Revlimid 25 mg by mouth daily for 21 days every 4 weeks in addition to Decadron 40 mg orally on a weekly basis. The patient is status post 28 cycles, discontinued today secondary to disease progression.  #6 Systemic chemotherapy with Velcade 1.3 mg/M2 on days 1, 4, 8 and 11 in addition to Doxil 30 mg/M2 on day 4 and Decadron 40 mg by mouth on weekly basis every 3 weeks. Status post 3 cycles, last dose was given 05/04/2012 discontinued secondary to intolerance.   Current therapy:  Zometa 4 mg IV given every 3 months.  Salvage therapy treatment with the Pomalyst 4 mg by mouth daily for 21 days every 28 days as well as Cytoxan 50 mg by mouth every other day and dexamethasone 40 mg by mouth once weekly. Therapy beginning 09/25/2012   INTERVAL HISTORY: [name] 51 y.o. male returns to the clinic today for a symptomatic minute visit. The patient is feeling fine today with no specific complaints. He thus far is tolerating his treatment with Pomalyst, Cytoxan and Decadron.  He denied having any significant fever or chills today. He denied having any nausea or vomiting. He denied having any significant chest pain, shortness breath, cough or hemoptysis. He denied any  bleeding.  MEDICAL HISTORY: Past Medical History  Diagnosis Date  . Multiple myeloma(203.0) 09/24/2011  . Hypertension     ALLERGIES:  is allergic to red dye and other.  MEDICATIONS:  Current Outpatient Prescriptions  Medication Sig Dispense Refill  . acetaminophen (TYLENOL) 500 MG tablet Take 1,000 mg by mouth every 6 (six) hours as needed. For pain/fever.      . cyclophosphamide (CYTOXAN) 50 MG tablet       . dexamethasone (DECADRON) 4 MG tablet       . gabapentin (NEURONTIN) 300 MG capsule Take 300 mg by mouth 3 (three) times daily.      [name] HYDROcodone-acetaminophen (NORCO) 10-325 MG per tablet Take 1-2 tablets by mouth every 6 (six) hours as needed for pain. For pain.  45 tablet  0  . irbesartan-hydrochlorothiazide (AVALIDE) 300-12.5 MG per tablet Take 1 tablet by mouth daily with breakfast.      . morphine (MSIR) 15 MG tablet Take 1 tablet (15 mg total) by mouth every 4 (four) hours as needed for pain. For pain  30 tablet  0  . omeprazole (PRILOSEC) 40 MG capsule       . pomalidomide (POMALYST) 4 MG capsule Take 1 capsule (4 mg total) by mouth daily. Take with water on days 1-21. Repeat every 28 days.   AUTH # [id number]  21 capsule  0  . warfarin (COUMADIN) 5 MG tablet Take 5 mg daily except 7.5 mg on Monday, Wednesday, Friday or as directed  45 tablet  2    REVIEW OF SYSTEMS:  A comprehensive review of systems was negative.   PHYSICAL EXAMINATION: General appearance: alert, cooperative and no distress Head: Normocephalic, without obvious abnormality, atraumatic Neck: no adenopathy Lymph nodes: Cervical, supraclavicular, and axillary nodes normal. Resp: clear to auscultation bilaterally Cardio: regular rate and rhythm, S1, S2 normal, no murmur, click, rub or gallop GI: soft, non-tender; bowel sounds normal; no masses,  no organomegaly Extremities: extremities normal, atraumatic, no cyanosis or edema Neurologic: Alert and oriented X 3, normal strength and tone. Normal symmetric  reflexes. Normal coordination and gait  ECOG PERFORMANCE STATUS: 1 - Symptomatic but completely ambulatory  Blood pressure 128/69, pulse 76, temperature 98.1 F (36.7 C), temperature source Oral, resp. rate 18, height 5\' 3"  (1.6 m), weight 232 lb 1.6 oz (105.28 kg).  LABORATORY DATA: Lab Results  Component Value Date   WBC 3.1* 10/05/2012   HGB 7.2* 10/05/2012   HCT 21.6* 10/05/2012   MCV 93.9 10/05/2012   PLT 119* 10/05/2012      Chemistry      Component Value Date/Time   NA 135* 10/05/2012 0836   NA 130* 09/06/2012 0410   K 3.1* 10/05/2012 0836   K 3.1* 09/06/2012 0410   CL 104 10/05/2012 0836   CL 98 09/06/2012 0410   CO2 25 10/05/2012 0836   CO2 22 09/06/2012 0410   BUN 8.0 10/05/2012 0836   BUN 7 09/06/2012 0410   CREATININE 0.6* 10/05/2012 0836   CREATININE 0.58 09/06/2012 0410      Component Value Date/Time   CALCIUM 8.9 10/05/2012 0836   CALCIUM 8.6 09/06/2012 0410   ALKPHOS 24* 10/05/2012 0836   ALKPHOS 25* 09/03/2012 1426   AST 11 10/05/2012 0836   AST 33 09/03/2012 1426   ALT 20 10/05/2012 0836   ALT 13 09/03/2012 1426   BILITOT 1.11 10/05/2012 0836   BILITOT 1.2 09/03/2012 1426       RADIOGRAPHIC STUDIES: Dg Chest 2 View  09/04/2012  *RADIOLOGY REPORT*  Clinical Data: Cough.  Bacteremia.  Evaluate for pneumonia.  CHEST - 2 VIEW  Comparison: Chest x-ray 09/02/2012.  Findings: There is a new area of airspace consolidation in the right upper lobe, concerning for right upper lobe pneumonia.  Lungs otherwise appear clear.  No definite pleural effusions.  Pulmonary vasculature and the cardiomediastinal silhouette are within normal limits.  IMPRESSION: 1.  New airspace consolidation in the right upper lobe concerning for developing right upper lobe pneumonia.   Original Report Authenticated By: Florencia Reasons, M.D.    Dg Chest 2 View  09/02/2012  *RADIOLOGY REPORT*  Clinical Data: Headache, cough, fever  CHEST - 2 VIEW  Comparison: 05/29/2012  Findings:  Cardiomediastinal silhouette is stable.  No acute infiltrate or pleural effusion.  No pulmonary edema.  Mild degenerative changes lower thoracic spine.  IMPRESSION: No active disease.  Mild degenerative changes lower thoracic spine.   Original Report Authenticated By: Natasha Mead, M.D.    Ct Head Wo Contrast  09/03/2012  *RADIOLOGY REPORT*  Clinical Data: Headache, altered mental status.  CT HEAD WITHOUT CONTRAST  Technique:  Contiguous axial images were obtained from the base of the skull through the vertex without contrast.  Comparison: May 08, 2007.  Findings: Diffuse lucencies are noted in the calvarium which are improved compared to the prior exam and are consistent with history of multiple myeloma.  Mild frontal atrophy is noted which is unchanged compared to prior exam.  No mass effect or midline shift is  noted.  Ventricular size is within normal limits.  There is no evidence of mass lesion, hemorrhage or acute infarction.  IMPRESSION: Lucencies noted throughout the calvarium on prior exam appear to be significantly improved consistent with the given history of multiple myeloma.  No acute intracranial abnormality is noted.   Original Report Authenticated By: Venita Sheffield., M.D.     ASSESSMENT/PLAN: This is a very pleasant 51 years old [place] American male with history of progressive multiple myeloma. He is currently being treated with salvage chemotherapy consisting of,Pomalyst 4 mg by mouth daily for 21 days every 28 days as well as Cytoxan 50 mg by mouth every other day and dexamethasone 40 mg by mouth once weekly. He has developed some chemotherapy-induced anemia with a hemoglobin of 7.2, although he is totally asymptomatic. The patient was discussed with Dr. Arbutus Ped. We will arrange to transfuse him 2 units of packed red blood cells to address the chemotherapy-induced anemia. He will continue on the current doses of Pomalyst, Cytoxan and dexamethasone. He'll return in 2 weeks for another  symptom management visit with a repeat CBC differential and C. met.  Laural Benes, Ardice Boyan E, PA-C   All questions were answered. The patient knows to call the clinic with any problems, questions or concerns. We can certainly see the patient much sooner if necessary.

## 2012-10-06 ENCOUNTER — Ambulatory Visit (HOSPITAL_BASED_OUTPATIENT_CLINIC_OR_DEPARTMENT_OTHER): Payer: 59

## 2012-10-06 VITALS — BP 107/63 | HR 63 | Temp 98.6°F | Resp 18

## 2012-10-06 DIAGNOSIS — D6481 Anemia due to antineoplastic chemotherapy: Secondary | ICD-10-CM

## 2012-10-06 DIAGNOSIS — C9000 Multiple myeloma not having achieved remission: Secondary | ICD-10-CM

## 2012-10-06 DIAGNOSIS — D649 Anemia, unspecified: Secondary | ICD-10-CM

## 2012-10-06 LAB — PREPARE RBC (CROSSMATCH)

## 2012-10-06 MED ORDER — DIPHENHYDRAMINE HCL 50 MG/ML IJ SOLN
25.0000 mg | Freq: Once | INTRAMUSCULAR | Status: AC
  Administered 2012-10-06: 25 mg via INTRAVENOUS

## 2012-10-06 MED ORDER — SODIUM CHLORIDE 0.9 % IV SOLN
250.0000 mL | Freq: Once | INTRAVENOUS | Status: AC
  Administered 2012-10-06: 250 mL via INTRAVENOUS

## 2012-10-06 MED ORDER — ACETAMINOPHEN 325 MG PO TABS
650.0000 mg | ORAL_TABLET | Freq: Once | ORAL | Status: AC
  Administered 2012-10-06: 650 mg via ORAL

## 2012-10-06 NOTE — Patient Instructions (Addendum)
Blood Transfusion Information WHAT IS A BLOOD TRANSFUSION? A transfusion is the replacement of blood or some of its parts. Blood is made up of multiple cells which provide different functions.  Red blood cells carry oxygen and are used for blood loss replacement.  White blood cells fight against infection.  Platelets control bleeding.  Plasma helps clot blood.  Other blood products are available for specialized needs, such as hemophilia or other clotting disorders. BEFORE THE TRANSFUSION  Who gives blood for transfusions?   You may be able to donate blood to be used at a later date on yourself (autologous donation).  Relatives can be asked to donate blood. This is generally not any safer than if you have received blood from a stranger. The same precautions are taken to ensure safety when a relative's blood is donated.  Healthy volunteers who are fully evaluated to make sure their blood is safe. This is blood bank blood. Transfusion therapy is the safest it has ever been in the practice of medicine. Before blood is taken from a donor, a complete history is taken to make sure that person has no history of diseases nor engages in risky social behavior (examples are intravenous drug use or sexual activity with multiple partners). The donor's travel history is screened to minimize risk of transmitting infections, such as malaria. The donated blood is tested for signs of infectious diseases, such as HIV and hepatitis. The blood is then tested to be sure it is compatible with you in order to minimize the chance of a transfusion reaction. If you or a relative donates blood, this is often done in anticipation of surgery and is not appropriate for emergency situations. It takes many days to process the donated blood. RISKS AND COMPLICATIONS Although transfusion therapy is very safe and saves many lives, the main dangers of transfusion include:   Getting an infectious disease.  Developing a  transfusion reaction. This is an allergic reaction to something in the blood you were given. Every precaution is taken to prevent this. The decision to have a blood transfusion has been considered carefully by your caregiver before blood is given. Blood is not given unless the benefits outweigh the risks. AFTER THE TRANSFUSION  Right after receiving a blood transfusion, you will usually feel much better and more energetic. This is especially true if your red blood cells have gotten low (anemic). The transfusion raises the level of the red blood cells which carry oxygen, and this usually causes an energy increase.  The nurse administering the transfusion will monitor you carefully for complications. HOME CARE INSTRUCTIONS  No special instructions are needed after a transfusion. You may find your energy is better. Speak with your caregiver about any limitations on activity for underlying diseases you may have. SEEK MEDICAL CARE IF:   Your condition is not improving after your transfusion.  You develop redness or irritation at the intravenous (IV) site. SEEK IMMEDIATE MEDICAL CARE IF:  Any of the following symptoms occur over the next 12 hours:  Shaking chills.  You have a temperature by mouth above 102 F (38.9 C), not controlled by medicine.  Chest, back, or muscle pain.  People around you feel you are not acting correctly or are confused.  Shortness of breath or difficulty breathing.  Dizziness and fainting.  You get a rash or develop hives.  You have a decrease in urine output.  Your urine turns a dark color or changes to pink, red, or brown. Any of the following   symptoms occur over the next 10 days:  You have a temperature by mouth above 102 F (38.9 C), not controlled by medicine.  Shortness of breath.  Weakness after normal activity.  The white part of the eye turns yellow (jaundice).  You have a decrease in the amount of urine or are urinating less often.  Your  urine turns a dark color or changes to pink, red, or brown. Document Released: 11/01/2000 Document Revised: 01/27/2012 Document Reviewed: 06/20/2008 Exit[employer] Patient Information 2013 [employer].  

## 2012-10-07 LAB — TYPE AND SCREEN
Antibody Screen: NEGATIVE
Donor AG Type: NEGATIVE
Donor AG Type: NEGATIVE
Unit division: 0
Unit division: 0

## 2012-10-12 ENCOUNTER — Ambulatory Visit (HOSPITAL_BASED_OUTPATIENT_CLINIC_OR_DEPARTMENT_OTHER): Payer: 59 | Admitting: Pharmacist

## 2012-10-12 ENCOUNTER — Other Ambulatory Visit (HOSPITAL_BASED_OUTPATIENT_CLINIC_OR_DEPARTMENT_OTHER): Payer: 59 | Admitting: Lab

## 2012-10-12 DIAGNOSIS — I776 Arteritis, unspecified: Secondary | ICD-10-CM

## 2012-10-12 LAB — PROTIME-INR
INR: 2 (ref 2.00–3.50)
Protime: 24 Seconds — ABNORMAL HIGH (ref 10.6–13.4)

## 2012-10-12 NOTE — Progress Notes (Signed)
INR returned to therapeutic level today (2.0) after holding 1 dose, then taking 5mg  daily except 7.5mg  on MWF No changes in meds or diet.  No complaints. Will continue current dose, and recheck INR in 1 week with scheduled lab/PA appt.

## 2012-10-19 ENCOUNTER — Telehealth: Payer: Self-pay | Admitting: *Deleted

## 2012-10-19 ENCOUNTER — Other Ambulatory Visit: Payer: 59 | Admitting: Lab

## 2012-10-19 ENCOUNTER — Telehealth: Payer: Self-pay | Admitting: Internal Medicine

## 2012-10-19 ENCOUNTER — Other Ambulatory Visit: Payer: Self-pay | Admitting: *Deleted

## 2012-10-19 ENCOUNTER — Ambulatory Visit: Payer: 59 | Admitting: Physician Assistant

## 2012-10-19 ENCOUNTER — Ambulatory Visit: Payer: 59

## 2012-10-19 MED ORDER — GABAPENTIN 300 MG PO CAPS: 300.0000 mg | ORAL_CAPSULE | Freq: Three times a day (TID) | ORAL | Status: DC

## 2012-10-19 NOTE — Telephone Encounter (Signed)
Pt called and and left a vm on stephanies phone/pt wishes to cx his appts today as he has pulled a muscle and cannot get around.

## 2012-10-19 NOTE — Telephone Encounter (Signed)
Pt called stating that he pulled a muscle in his back remodeling his bathroom and re-doing his mother's floors in her house.  He wants to know if Dr Donnald Garre will call in a rx for him.  Per Dr Donnald Garre, okay to take ibuprofen prn.  He was advised to only take a couple times daily.  It was also advised that pt be careful with the home renovations since his diagnosis can make his bones more susceptible to breaking.  Pt verbalized understanding.  SLJ

## 2012-10-22 ENCOUNTER — Telehealth: Payer: Self-pay | Admitting: Internal Medicine

## 2012-10-22 NOTE — Telephone Encounter (Signed)
pt called and needed to reschedule missed appt.Marland KitchenMarland KitchenDone...coumadin OK!! @ 11:00am 12.10.13

## 2012-10-23 ENCOUNTER — Other Ambulatory Visit: Payer: Self-pay | Admitting: *Deleted

## 2012-10-23 DIAGNOSIS — C9000 Multiple myeloma not having achieved remission: Secondary | ICD-10-CM

## 2012-10-23 MED ORDER — POMALIDOMIDE 4 MG PO CAPS: 4.0000 mg | ORAL_CAPSULE | Freq: Every day | ORAL | Status: DC

## 2012-10-23 NOTE — Addendum Note (Signed)
Addended by: Arvilla Meres on: 10/23/2012 12:31 PM   Modules accepted: Orders

## 2012-10-23 NOTE — Telephone Encounter (Signed)
THIS REFILL REQUEST FOR POMALYST WAS GIVEN TO DR.MOHAMED'S NURSE, STEPHANIE JOHNSON,RN. 

## 2012-10-27 ENCOUNTER — Ambulatory Visit: Payer: 59

## 2012-10-27 ENCOUNTER — Ambulatory Visit (HOSPITAL_BASED_OUTPATIENT_CLINIC_OR_DEPARTMENT_OTHER): Payer: 59 | Admitting: Physician Assistant

## 2012-10-27 ENCOUNTER — Ambulatory Visit: Payer: 59 | Admitting: Pharmacist

## 2012-10-27 ENCOUNTER — Other Ambulatory Visit (HOSPITAL_BASED_OUTPATIENT_CLINIC_OR_DEPARTMENT_OTHER): Payer: 59

## 2012-10-27 ENCOUNTER — Encounter: Payer: Self-pay | Admitting: Physician Assistant

## 2012-10-27 ENCOUNTER — Telehealth: Payer: Self-pay | Admitting: Internal Medicine

## 2012-10-27 VITALS — BP 125/70 | HR 74 | Temp 97.6°F | Resp 20 | Ht 63.0 in | Wt 227.8 lb

## 2012-10-27 DIAGNOSIS — I776 Arteritis, unspecified: Secondary | ICD-10-CM

## 2012-10-27 DIAGNOSIS — C9000 Multiple myeloma not having achieved remission: Secondary | ICD-10-CM

## 2012-10-27 LAB — COMPREHENSIVE METABOLIC PANEL (CC13)
Alkaline Phosphatase: 35 U/L — ABNORMAL LOW (ref 40–150)
CO2: 25 mEq/L (ref 22–29)
Creatinine: 0.7 mg/dL (ref 0.7–1.3)
Glucose: 96 mg/dl (ref 70–99)
Sodium: 134 mEq/L — ABNORMAL LOW (ref 136–145)
Total Bilirubin: 0.53 mg/dL (ref 0.20–1.20)
Total Protein: 9.7 g/dL — ABNORMAL HIGH (ref 6.4–8.3)

## 2012-10-27 LAB — LACTATE DEHYDROGENASE (CC13): LDH: 175 U/L (ref 125–245)

## 2012-10-27 LAB — PROTIME-INR: Protime: 25.2 Seconds — ABNORMAL HIGH (ref 10.6–13.4)

## 2012-10-27 LAB — CBC WITH DIFFERENTIAL/PLATELET
BASO%: 2.2 % — ABNORMAL HIGH (ref 0.0–2.0)
Eosinophils Absolute: 0.1 10*3/uL (ref 0.0–0.5)
HCT: 29.3 % — ABNORMAL LOW (ref 38.4–49.9)
LYMPH%: 21.4 % (ref 14.0–49.0)
MCHC: 34.8 g/dL (ref 32.0–36.0)
MCV: 95.7 fL (ref 79.3–98.0)
MONO%: 14.1 % — ABNORMAL HIGH (ref 0.0–14.0)
NEUT%: 61.1 % (ref 39.0–75.0)
Platelets: 236 10*3/uL (ref 140–400)
RBC: 3.06 10*6/uL — ABNORMAL LOW (ref 4.20–5.82)

## 2012-10-27 NOTE — Progress Notes (Signed)
INR therapeutic today (2.1) on 5mg  daily except 7.5mg  on MWF No changes.  No complaints. Continue current dose.  Recheck INR in 3 weeks.

## 2012-10-27 NOTE — Patient Instructions (Addendum)
Continue on the Pomalyst, Cytoxan and Decadron as prescribed Follow up in 1 month

## 2012-10-27 NOTE — Telephone Encounter (Signed)
appts made and printed for pt aom °

## 2012-10-30 NOTE — Progress Notes (Signed)
[facility] Telephone:(336) [phone]   Fax:(336) [phone]  OFFICE PROGRESS NOTE  [name], MD [address] [place] [address]  Principle Diagnosis: #1 recurrent multiple myeloma IgG kappa subtype diagnosed in June of 2008 #2 history of vasculitis and thrombosis of skin lesions   Prior Therapy: #1 status post palliative radiotherapy to the left hip under the care of Dr. Roselind Messier  #2 status post 5 cycles of systemic chemotherapy with Revlimid and low dose Decadron. Last dose given June 2009 with good response.  #3 status post autologous peripheral blood stem cell transplant at [facility] on 02/02/2008.  #4 the patient had evidence for disease recurrence in December 2010.  #5 Revlimid 25 mg by mouth daily for 21 days every 4 weeks in addition to Decadron 40 mg orally on a weekly basis. The patient is status post 28 cycles, discontinued today secondary to disease progression.  #6 Systemic chemotherapy with Velcade 1.3 mg/M2 on days 1, 4, 8 and 11 in addition to Doxil 30 mg/M2 on day 4 and Decadron 40 mg by mouth on weekly basis every 3 weeks. Status post 3 cycles, last dose was given 05/04/2012 discontinued secondary to intolerance.   Current therapy:  Zometa 4 mg IV given every 3 months.  Salvage therapy treatment with the Pomalyst 4 mg by mouth daily for 21 days every 28 days as well as Cytoxan 50 mg by mouth every other day and dexamethasone 40 mg by mouth once weekly. Therapy beginning 09/25/2012, status post 1 cycle   INTERVAL HISTORY: [name] 51 y.o. male returns to the clinic today for a symptom management visit. Overall the patient is feeling fine today with no specific complaints. He thus far is tolerating his treatment with Pomalyst, Cytoxan and Decadron.  He is recovering from a "pulled muscle" that he got while laying some flooring in his mother's house last week. He states that this is getting better. He reports that he  will need a letter releasing him to go back to work as of 11/24/2012. He denied having any significant fever or chills today. He denied having any nausea or vomiting. He denied having any significant chest pain, shortness breath, cough or hemoptysis. He denied any bleeding. He does continue to have some numbness in his feet.  MEDICAL HISTORY: Past Medical History  Diagnosis Date  . Multiple myeloma(203.0) 09/24/2011  . Hypertension     ALLERGIES:  is allergic to red dye and other.  MEDICATIONS:  Current Outpatient Prescriptions  Medication Sig Dispense Refill  . acetaminophen (TYLENOL) 500 MG tablet Take 1,000 mg by mouth every 6 (six) hours as needed. For pain/fever.      . cyclophosphamide (CYTOXAN) 50 MG tablet       . dexamethasone (DECADRON) 4 MG tablet       . gabapentin (NEURONTIN) 300 MG capsule Take 1 capsule (300 mg total) by mouth 3 (three) times daily.  90 capsule  2  . HYDROcodone-acetaminophen (NORCO) 10-325 MG per tablet Take 1-2 tablets by mouth every 6 (six) hours as needed for pain. For pain.  45 tablet  0  . irbesartan-hydrochlorothiazide (AVALIDE) 300-12.5 MG per tablet Take 1 tablet by mouth daily with breakfast.      . morphine (MSIR) 15 MG tablet Take 1 tablet (15 mg total) by mouth every 4 (four) hours as needed for pain. For pain  30 tablet  0  . omeprazole (PRILOSEC) 40 MG capsule       .  pomalidomide (POMALYST) 4 MG capsule Take 1 capsule (4 mg total) by mouth daily. Take with water on days 1-21. Repeat every 28 days.  21 capsule  0  . warfarin (COUMADIN) 5 MG tablet Take 5 mg daily except 7.5 mg on Monday, Wednesday, Friday or as directed  45 tablet  2    REVIEW OF SYSTEMS:  Pertinent items are noted in HPI.   PHYSICAL EXAMINATION: General appearance: alert, cooperative and no distress Head: Normocephalic, without obvious abnormality, atraumatic Neck: no adenopathy Lymph nodes: Cervical, supraclavicular, and axillary nodes normal. Resp: clear to auscultation  bilaterally Cardio: regular rate and rhythm, S1, S2 normal, no murmur, click, rub or gallop GI: soft, non-tender; bowel sounds normal; no masses,  no organomegaly Extremities: extremities normal, atraumatic, no cyanosis or edema Neurologic: Alert and oriented X 3, normal strength and tone. Normal symmetric reflexes. Normal coordination and gait  ECOG PERFORMANCE STATUS: 1 - Symptomatic but completely ambulatory  Blood pressure 125/70, pulse 74, temperature 97.6 F (36.4 C), temperature source Oral, resp. rate 20, height 5\' 3"  (1.6 m), weight 227 lb 12.8 oz (103.329 kg).  LABORATORY DATA: Lab Results  Component Value Date   WBC 4.9 10/27/2012   HGB 10.2* 10/27/2012   HCT 29.3* 10/27/2012   MCV 95.7 10/27/2012   PLT 236 10/27/2012      Chemistry      Component Value Date/Time   NA 134* 10/27/2012 0928   NA 130* 09/06/2012 0410   K 4.2 10/27/2012 0928   K 3.1* 09/06/2012 0410   CL 102 10/27/2012 0928   CL 98 09/06/2012 0410   CO2 25 10/27/2012 0928   CO2 22 09/06/2012 0410   BUN 14.0 10/27/2012 0928   BUN 7 09/06/2012 0410   CREATININE 0.7 10/27/2012 0928   CREATININE 0.58 09/06/2012 0410      Component Value Date/Time   CALCIUM 9.3 10/27/2012 0928   CALCIUM 8.6 09/06/2012 0410   ALKPHOS 35* 10/27/2012 0928   ALKPHOS 25* 09/03/2012 1426   AST 12 10/27/2012 0928   AST 33 09/03/2012 1426   ALT 16 10/27/2012 0928   ALT 13 09/03/2012 1426   BILITOT 0.53 10/27/2012 0928   BILITOT 1.2 09/03/2012 1426       RADIOGRAPHIC STUDIES: Dg Chest 2 View  09/04/2012  *RADIOLOGY REPORT*  Clinical Data: Cough.  Bacteremia.  Evaluate for pneumonia.  CHEST - 2 VIEW  Comparison: Chest x-ray 09/02/2012.  Findings: There is a new area of airspace consolidation in the right upper lobe, concerning for right upper lobe pneumonia.  Lungs otherwise appear clear.  No definite pleural effusions.  Pulmonary vasculature and the cardiomediastinal silhouette are within normal limits.  IMPRESSION: 1.   New airspace consolidation in the right upper lobe concerning for developing right upper lobe pneumonia.   Original Report Authenticated By: Florencia Reasons, M.D.    Dg Chest 2 View  09/02/2012  *RADIOLOGY REPORT*  Clinical Data: Headache, cough, fever  CHEST - 2 VIEW  Comparison: 05/29/2012  Findings: Cardiomediastinal silhouette is stable.  No acute infiltrate or pleural effusion.  No pulmonary edema.  Mild degenerative changes lower thoracic spine.  IMPRESSION: No active disease.  Mild degenerative changes lower thoracic spine.   Original Report Authenticated By: Natasha Mead, M.D.    Ct Head Wo Contrast  09/03/2012  *RADIOLOGY REPORT*  Clinical Data: Headache, altered mental status.  CT HEAD WITHOUT CONTRAST  Technique:  Contiguous axial images were obtained from the base of the skull through the vertex  without contrast.  Comparison: May 08, 2007.  Findings: Diffuse lucencies are noted in the calvarium which are improved compared to the prior exam and are consistent with history of multiple myeloma.  Mild frontal atrophy is noted which is unchanged compared to prior exam.  No mass effect or midline shift is noted.  Ventricular size is within normal limits.  There is no evidence of mass lesion, hemorrhage or acute infarction.  IMPRESSION: Lucencies noted throughout the calvarium on prior exam appear to be significantly improved consistent with the given history of multiple myeloma.  No acute intracranial abnormality is noted.   Original Report Authenticated By: Venita Sheffield., M.D.     ASSESSMENT/PLAN: This is a very pleasant 51 years old [place] American male with history of progressive multiple myeloma. He is currently being treated with salvage chemotherapy consisting of,Pomalyst 4 mg by mouth daily for 21 days every 28 days as well as Cytoxan 50 mg by mouth every other day and dexamethasone 40 mg by mouth once weekly. He is status post 1 cycle. He is due to begin his next cycle will 11 2013.  The patient was discussed with Dr. Arbutus Ped. His hemoglobin is stable today at 10.2 g/dL. He will continue on the current doses of Pomalyst, Cytoxan and dexamethasone. He will followup in one month for another symptom management visit with a repeat CBC differential, C. met and LDH. We will provide him with a letter relating him to go back to work beginning 11/24/2012. We'll continue to monitor his peripheral neuropathy symptoms and continue to monitor his hemoglobin closely. He is currently out on short-term disability and may at some point in the future need to look at permanent disability however at this point he may return to work.   Laural Benes, Viana Sleep E, PA-C   All questions were answered. The patient knows to call the clinic with any problems, questions or concerns. We can certainly see the patient much sooner if necessary.

## 2012-11-02 ENCOUNTER — Telehealth: Payer: Self-pay | Admitting: *Deleted

## 2012-11-02 NOTE — Telephone Encounter (Signed)
Permanent handicap application left at the front desk for pt to pick up.  SLJ

## 2012-11-03 ENCOUNTER — Other Ambulatory Visit: Payer: Self-pay | Admitting: *Deleted

## 2012-11-03 DIAGNOSIS — G629 Polyneuropathy, unspecified: Secondary | ICD-10-CM

## 2012-11-03 MED ORDER — HYDROCODONE-ACETAMINOPHEN 10-325 MG PO TABS: 1.0000 | ORAL_TABLET | Freq: Four times a day (QID) | ORAL | Status: DC | PRN

## 2012-11-10 ENCOUNTER — Telehealth: Payer: Self-pay | Admitting: Medical Oncology

## 2012-11-10 NOTE — Telephone Encounter (Signed)
Pt will pick up letter on Thursday. Letter taken to front to [facility]

## 2012-11-10 NOTE — Telephone Encounter (Signed)
Left message for pt to call and let me know if he wants me to mail return to work letter or pick it up today before 4 or pick up on thursday

## 2012-11-11 ENCOUNTER — Other Ambulatory Visit: Payer: Self-pay | Admitting: Internal Medicine

## 2012-11-12 ENCOUNTER — Other Ambulatory Visit: Payer: Self-pay | Admitting: Pharmacist

## 2012-11-12 DIAGNOSIS — I776 Arteritis, unspecified: Secondary | ICD-10-CM

## 2012-11-12 MED ORDER — WARFARIN SODIUM 5 MG PO TABS: ORAL_TABLET | ORAL | Status: DC

## 2012-11-16 ENCOUNTER — Telehealth: Payer: Self-pay | Admitting: *Deleted

## 2012-11-16 ENCOUNTER — Other Ambulatory Visit (HOSPITAL_BASED_OUTPATIENT_CLINIC_OR_DEPARTMENT_OTHER): Payer: 59 | Admitting: Lab

## 2012-11-16 ENCOUNTER — Ambulatory Visit (HOSPITAL_BASED_OUTPATIENT_CLINIC_OR_DEPARTMENT_OTHER): Payer: 59 | Admitting: Pharmacist

## 2012-11-16 DIAGNOSIS — C9000 Multiple myeloma not having achieved remission: Secondary | ICD-10-CM

## 2012-11-16 DIAGNOSIS — I776 Arteritis, unspecified: Secondary | ICD-10-CM

## 2012-11-16 LAB — COMPREHENSIVE METABOLIC PANEL (CC13)
ALT: 10 U/L (ref 0–55)
AST: 8 U/L (ref 5–34)
Albumin: 2.3 g/dL — ABNORMAL LOW (ref 3.5–5.0)
CO2: 24 mEq/L (ref 22–29)
Calcium: 8.6 mg/dL (ref 8.4–10.4)
Chloride: 106 mEq/L (ref 98–107)
Potassium: 3.8 mEq/L (ref 3.5–5.1)
Sodium: 136 mEq/L (ref 136–145)
Total Protein: 8.4 g/dL — ABNORMAL HIGH (ref 6.4–8.3)

## 2012-11-16 LAB — CBC WITH DIFFERENTIAL/PLATELET
Basophils Absolute: 0 10*3/uL (ref 0.0–0.1)
HCT: 26.8 % — ABNORMAL LOW (ref 38.4–49.9)
HGB: 8.8 g/dL — ABNORMAL LOW (ref 13.0–17.1)
LYMPH%: 28.7 % (ref 14.0–49.0)
MCHC: 32.8 g/dL (ref 32.0–36.0)
MONO#: 0.5 10*3/uL (ref 0.1–0.9)
NEUT%: 47.2 % (ref 39.0–75.0)
Platelets: 167 10*3/uL (ref 140–400)
WBC: 3.6 10*3/uL — ABNORMAL LOW (ref 4.0–10.3)
lymph#: 1 10*3/uL (ref 0.9–3.3)

## 2012-11-16 LAB — PROTIME-INR: INR: 3.8 — ABNORMAL HIGH (ref 2.00–3.50)

## 2012-11-16 LAB — LACTATE DEHYDROGENASE (CC13): LDH: 137 U/L (ref 125–245)

## 2012-11-16 NOTE — Progress Notes (Signed)
INR = 3.8 on 5 mg/day; 7.5 mg MWF Pt has recently completed abx (?) for a "cold".  He also got a "shot of penicillin" at his PCP recently. No bleeding or bruising per pt. INR a little high today, likely due to recent abx.  He has been stable on this dose of Coumadin prior to the abx so I will keep him on the same dose of Coumadin. Repeat INR 12/10/12 Marily Lente, Pharm.D.

## 2012-11-16 NOTE — Telephone Encounter (Signed)
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

## 2012-11-20 ENCOUNTER — Other Ambulatory Visit: Payer: Self-pay | Admitting: *Deleted

## 2012-11-20 ENCOUNTER — Encounter: Payer: Self-pay | Admitting: Internal Medicine

## 2012-11-20 DIAGNOSIS — C9000 Multiple myeloma not having achieved remission: Secondary | ICD-10-CM

## 2012-11-20 MED ORDER — POMALIDOMIDE 4 MG PO CAPS: ORAL_CAPSULE | ORAL | Status: DC

## 2012-11-20 NOTE — Progress Notes (Unsigned)
11/20/2012  Insurance forms have been faxed to [facility] for this patient.  Originals have ben placed out front with Ms. Wilma for patient to pickup.   [place]ita Quin [phone]

## 2012-11-23 ENCOUNTER — Encounter: Payer: Self-pay | Admitting: Physician Assistant

## 2012-11-23 ENCOUNTER — Telehealth: Payer: Self-pay | Admitting: Medical Oncology

## 2012-11-23 ENCOUNTER — Ambulatory Visit (HOSPITAL_BASED_OUTPATIENT_CLINIC_OR_DEPARTMENT_OTHER): Payer: 59 | Admitting: Physician Assistant

## 2012-11-23 ENCOUNTER — Encounter: Payer: Self-pay | Admitting: Pharmacist

## 2012-11-23 ENCOUNTER — Other Ambulatory Visit (HOSPITAL_BASED_OUTPATIENT_CLINIC_OR_DEPARTMENT_OTHER): Payer: 59 | Admitting: Lab

## 2012-11-23 VITALS — BP 113/71 | HR 93 | Temp 98.8°F | Resp 20 | Ht 63.0 in | Wt 233.0 lb

## 2012-11-23 DIAGNOSIS — J40 Bronchitis, not specified as acute or chronic: Secondary | ICD-10-CM

## 2012-11-23 DIAGNOSIS — C9002 Multiple myeloma in relapse: Secondary | ICD-10-CM

## 2012-11-23 DIAGNOSIS — C9000 Multiple myeloma not having achieved remission: Secondary | ICD-10-CM

## 2012-11-23 DIAGNOSIS — Z86718 Personal history of other venous thrombosis and embolism: Secondary | ICD-10-CM

## 2012-11-23 LAB — COMPREHENSIVE METABOLIC PANEL (CC13)
AST: 14 U/L (ref 5–34)
Alkaline Phosphatase: 28 U/L — ABNORMAL LOW (ref 40–150)
Glucose: 87 mg/dl (ref 70–99)
Sodium: 128 mEq/L — ABNORMAL LOW (ref 136–145)
Total Bilirubin: 1.34 mg/dL — ABNORMAL HIGH (ref 0.20–1.20)
Total Protein: 10.2 g/dL — ABNORMAL HIGH (ref 6.4–8.3)

## 2012-11-23 LAB — CBC WITH DIFFERENTIAL/PLATELET
EOS%: 2.5 % (ref 0.0–7.0)
Eosinophils Absolute: 0.1 10*3/uL (ref 0.0–0.5)
LYMPH%: 15.7 % (ref 14.0–49.0)
MCH: 34.5 pg — ABNORMAL HIGH (ref 27.2–33.4)
MCHC: 34.5 g/dL (ref 32.0–36.0)
MCV: 100 fL — ABNORMAL HIGH (ref 79.3–98.0)
MONO%: 26.5 % — ABNORMAL HIGH (ref 0.0–14.0)
NEUT#: 1.5 10*3/uL (ref 1.5–6.5)
Platelets: 199 10*3/uL (ref 140–400)
RBC: 3.05 10*6/uL — ABNORMAL LOW (ref 4.20–5.82)

## 2012-11-23 MED ORDER — AZITHROMYCIN 250 MG PO TABS: ORAL_TABLET | ORAL | Status: DC

## 2012-11-23 NOTE — Progress Notes (Signed)
As per [name], PA note from today, pt will start on Z-pack. Adrena addressed Coumadin dosing recommendations w/ [employer], Pharm.D. & pt will decrease Coumadin to 5 mg/day for the next 7 days. We will recheck INR 12/01/12.  I spoke w/ pt over phone & he understands plan. Marily Lente, Pharm.D.

## 2012-11-23 NOTE — Patient Instructions (Addendum)
Continue, Pomalyst, Cytoxan and Decadron as prescribed Your being given a prescription for a Z-Pak for your bronchitis. While on the Z-Pak decrease her Coumadin to 5 mg by mouth daily for the next 7 days. Followup with the Coumadin clinic as scheduled on 12/10/2012 or as directed. Followup in one month with Dr. Arbutus Ped with repeat protein studies to reevaluate your disease.

## 2012-11-23 NOTE — Telephone Encounter (Signed)
I left a message on pts phone to call [facility] and tell them he needs his pomalyst. He is already authorized for an rx on jan 3.2014

## 2012-11-23 NOTE — Progress Notes (Signed)
[facility] [phone]   [phone]  OFFICE PROGRESS NOTE  [name], MD [address] [place] [address]  Principle Diagnosis: #1 recurrent multiple myeloma IgG kappa subtype diagnosed in June of 2008 #2 history of vasculitis and thrombosis of skin lesions   Prior Therapy: #1 status post palliative radiotherapy to the left hip under the care of Dr. Roselind Messier  #2 status post 5 cycles of systemic chemotherapy with Revlimid and low dose Decadron. Last dose given June 2009 with good response.  #3 status post autologous peripheral blood stem cell transplant at [facility] on 02/02/2008.  #4 the patient had evidence for disease recurrence in December 2010.  #5 Revlimid 25 mg by mouth daily for 21 days every 4 weeks in addition to Decadron 40 mg orally on a weekly basis. The patient is status post 28 cycles, discontinued today secondary to disease progression.  #6 Systemic chemotherapy with Velcade 1.3 mg/M2 on days 1, 4, 8 and 11 in addition to Doxil 30 mg/M2 on day 4 and Decadron 40 mg by mouth on weekly basis every 3 weeks. Status post 3 cycles, last dose was given 05/04/2012 discontinued secondary to intolerance.   Current therapy:  Zometa 4 mg IV given every 3 months.  Salvage therapy treatment with the Pomalyst 4 mg by mouth daily for 21 days every 28 days as well as Cytoxan 50 mg by mouth every other day and dexamethasone 40 mg by mouth once weekly. Therapy beginning 09/25/2012, status post 2 cycles   INTERVAL HISTORY: [name] SIEVERS 52 y.o. male returns to the clinic today for a symptom management visit. He complains of cold symptoms for the past 2 weeks with a productive cough. The cough is productive of green to yellow secretions. Symptom complexes also associated with fatigue. He said some low-grade temperatures. He voiced no other specific complaints. He continues to tolerate the Pomalyst, Cytoxan and Decadron  without difficulty. He's had no problems with bleeding or bruising. He denied having any nausea or vomiting. He denied having any significant chest pain, shortness breath, hemoptysis.  He does continue to have some numbness in his feet. He is set to return to work on 11/26/2012.  MEDICAL HISTORY: Past Medical History  Diagnosis Date  . Multiple myeloma(203.0) 09/24/2011  . Hypertension     ALLERGIES:  is allergic to red dye and other.  MEDICATIONS:  Current Outpatient Prescriptions  Medication Sig Dispense Refill  . acetaminophen (TYLENOL) 500 MG tablet Take 1,000 mg by mouth every 6 (six) hours as needed. For pain/fever.      [name] azithromycin (ZITHROMAX Z-PAK) 250 MG tablet Take 2 tablets by mouth on day one, then take 1 tablet by mouth daily until completed  6 each  0  . cyclophosphamide (CYTOXAN) 50 MG tablet       . dexamethasone (DECADRON) 4 MG tablet       . gabapentin (NEURONTIN) 300 MG capsule Take 1 capsule (300 mg total) by mouth 3 (three) times daily.  90 capsule  2  . HYDROcodone-acetaminophen (NORCO) 10-325 MG per tablet Take 1-2 tablets by mouth every 6 (six) hours as needed for pain. For pain.  45 tablet  0  . irbesartan-hydrochlorothiazide (AVALIDE) 300-12.5 MG per tablet Take 1 tablet by mouth daily with breakfast.      . morphine (MSIR) 15 MG tablet Take 1 tablet (15 mg total) by mouth every 4 (four) hours as needed for pain.  For pain  30 tablet  0  . omeprazole (PRILOSEC) 40 MG capsule       . pomalidomide (POMALYST) 4 MG capsule Take with water on days 1-21. Repeat every 28 days.  21 capsule  0  . warfarin (COUMADIN) 5 MG tablet Take 5 mg PO daily except 7.5 mg on Monday, Wednesday, Friday or as directed  50 tablet  2    REVIEW OF SYSTEMS:  Pertinent items are noted in HPI.   PHYSICAL EXAMINATION: General appearance: alert, cooperative and no distress Head: Normocephalic, without obvious abnormality, atraumatic Neck: no adenopathy Lymph nodes: Cervical,  supraclavicular, and axillary nodes normal. Resp: clear to auscultation bilaterally Cardio: regular rate and rhythm, S1, S2 normal, no murmur, click, rub or gallop GI: soft, non-tender; bowel sounds normal; no masses,  no organomegaly Extremities: extremities normal, atraumatic, no cyanosis or edema Neurologic: Alert and oriented X 3, normal strength and tone. Normal symmetric reflexes. Normal coordination and gait  ECOG PERFORMANCE STATUS: 1 - Symptomatic but completely ambulatory  Blood pressure 113/71, pulse 93, temperature 98.8 F (37.1 C), temperature source Oral, resp. rate 20, height 5\' 3"  (1.6 m), weight 233 lb (105.688 kg).  LABORATORY DATA: Lab Results  Component Value Date   WBC 2.7* 11/23/2012   HGB 10.5* 11/23/2012   HCT 30.5* 11/23/2012   MCV 100.0* 11/23/2012   PLT 199 11/23/2012      Chemistry      Component Value Date/Time   NA 128* 11/23/2012 0914   NA 130* 09/06/2012 0410   K 4.2 11/23/2012 0914   K 3.1* 09/06/2012 0410   CL 97* 11/23/2012 0914   CL 98 09/06/2012 0410   CO2 24 11/23/2012 0914   CO2 22 09/06/2012 0410   BUN 11.0 11/23/2012 0914   BUN 7 09/06/2012 0410   CREATININE 0.7 11/23/2012 0914   CREATININE 0.58 09/06/2012 0410      Component Value Date/Time   CALCIUM 9.3 11/23/2012 0914   CALCIUM 8.6 09/06/2012 0410   ALKPHOS 28* 11/23/2012 0914   ALKPHOS 25* 09/03/2012 1426   AST 14 11/23/2012 0914   AST 33 09/03/2012 1426   ALT 16 11/23/2012 0914   ALT 13 09/03/2012 1426   BILITOT 1.34* 11/23/2012 0914   BILITOT 1.2 09/03/2012 1426       RADIOGRAPHIC STUDIES: Dg Chest 2 View  09/04/2012  *RADIOLOGY REPORT*  Clinical Data: Cough.  Bacteremia.  Evaluate for pneumonia.  CHEST - 2 VIEW  Comparison: Chest x-ray 09/02/2012.  Findings: There is a new area of airspace consolidation in the right upper lobe, concerning for right upper lobe pneumonia.  Lungs otherwise appear clear.  No definite pleural effusions.  Pulmonary vasculature and the cardiomediastinal silhouette are  within normal limits.  IMPRESSION: 1.  New airspace consolidation in the right upper lobe concerning for developing right upper lobe pneumonia.   Original Report Authenticated By: Florencia Reasons, M.D.    Dg Chest 2 View  09/02/2012  *RADIOLOGY REPORT*  Clinical Data: Headache, cough, fever  CHEST - 2 VIEW  Comparison: 05/29/2012  Findings: Cardiomediastinal silhouette is stable.  No acute infiltrate or pleural effusion.  No pulmonary edema.  Mild degenerative changes lower thoracic spine.  IMPRESSION: No active disease.  Mild degenerative changes lower thoracic spine.   Original Report Authenticated By: Natasha Mead, M.D.    Ct Head Wo Contrast  09/03/2012  *RADIOLOGY REPORT*  Clinical Data: Headache, altered mental status.  CT HEAD WITHOUT CONTRAST  Technique:  Contiguous axial images  were obtained from the base of the skull through the vertex without contrast.  Comparison: May 08, 2007.  Findings: Diffuse lucencies are noted in the calvarium which are improved compared to the prior exam and are consistent with history of multiple myeloma.  Mild frontal atrophy is noted which is unchanged compared to prior exam.  No mass effect or midline shift is noted.  Ventricular size is within normal limits.  There is no evidence of mass lesion, hemorrhage or acute infarction.  IMPRESSION: Lucencies noted throughout the calvarium on prior exam appear to be significantly improved consistent with the given history of multiple myeloma.  No acute intracranial abnormality is noted.   Original Report Authenticated By: Venita Sheffield., M.D.     ASSESSMENT/PLAN: This is a very pleasant 52 years old [place] American male with history of progressive multiple myeloma. He is currently being treated with salvage chemotherapy consisting of,Pomalyst 4 mg by mouth daily for 21 days every 28 days as well as Cytoxan 50 mg by mouth every other day and dexamethasone 40 mg by mouth once weekly. He is status post 2 cycles. He is due  to begin his next cycle will 1/10/ 2014. The patient was discussed with Dr. Arbutus Ped. His hemoglobin is stable today at 10.5 g/dL. He will continue on the current doses of Pomalyst, Cytoxan and dexamethasone. He will followup with Dr. Arbutus Ped in one month for another symptom management visit with a repeat CBC differential, C. met and LDH as well as quantitative immunoglobin, beta 2 microglobulin, and serum light chains to reevaluate his disease.. For his symptoms consistent with bronchitis, a prescription for Z-Pak will be sent to his pharmacy of record via [name]. scribed. In speaking with our pharmacists with her Coumadin clinic and we will decrease his Coumadin to 5 mg daily for the next 7 days while he is on the Z-Pak. Patient voiced understanding.   Laural Benes, [name] Seder [name], [name]   All questions were answered. The patient knows to call the clinic with any problems, questions or concerns. We can certainly see the patient much sooner if necessary.

## 2012-11-23 NOTE — Telephone Encounter (Signed)
s.w pt and gv march appt....pt ok and aware

## 2012-11-24 NOTE — Telephone Encounter (Addendum)
[facility] waiting on pt to call. I told pt to call [phone] to set up delivery

## 2012-12-01 ENCOUNTER — Emergency Department (HOSPITAL_COMMUNITY): Payer: 59

## 2012-12-01 ENCOUNTER — Other Ambulatory Visit: Payer: 59

## 2012-12-01 ENCOUNTER — Encounter (HOSPITAL_COMMUNITY): Payer: Self-pay | Admitting: *Deleted

## 2012-12-01 ENCOUNTER — Inpatient Hospital Stay (HOSPITAL_COMMUNITY)
Admission: EM | Admit: 2012-12-01 | Discharge: 2012-12-20 | DRG: 163 | Disposition: A | Discharge status: Home Health | Payer: 59 | Attending: Cardiothoracic Surgery | Admitting: Cardiothoracic Surgery

## 2012-12-01 ENCOUNTER — Ambulatory Visit: Payer: 59

## 2012-12-01 DIAGNOSIS — E871 Hypo-osmolality and hyponatremia: Secondary | ICD-10-CM | POA: Diagnosis present

## 2012-12-01 DIAGNOSIS — D62 Acute posthemorrhagic anemia: Secondary | ICD-10-CM | POA: Diagnosis not present

## 2012-12-01 DIAGNOSIS — D6481 Anemia due to antineoplastic chemotherapy: Secondary | ICD-10-CM | POA: Diagnosis present

## 2012-12-01 DIAGNOSIS — Z7901 Long term (current) use of anticoagulants: Secondary | ICD-10-CM

## 2012-12-01 DIAGNOSIS — J90 Pleural effusion, not elsewhere classified: Secondary | ICD-10-CM | POA: Diagnosis not present

## 2012-12-01 DIAGNOSIS — C9000 Multiple myeloma not having achieved remission: Secondary | ICD-10-CM | POA: Diagnosis present

## 2012-12-01 DIAGNOSIS — J441 Chronic obstructive pulmonary disease with (acute) exacerbation: Secondary | ICD-10-CM | POA: Diagnosis present

## 2012-12-01 DIAGNOSIS — J690 Pneumonitis due to inhalation of food and vomit: Principal | ICD-10-CM | POA: Diagnosis present

## 2012-12-01 DIAGNOSIS — J189 Pneumonia, unspecified organism: Secondary | ICD-10-CM | POA: Diagnosis present

## 2012-12-01 DIAGNOSIS — J869 Pyothorax without fistula: Secondary | ICD-10-CM

## 2012-12-01 DIAGNOSIS — D630 Anemia in neoplastic disease: Secondary | ICD-10-CM | POA: Diagnosis present

## 2012-12-01 DIAGNOSIS — Z9889 Other specified postprocedural states: Secondary | ICD-10-CM

## 2012-12-01 DIAGNOSIS — E876 Hypokalemia: Secondary | ICD-10-CM | POA: Diagnosis not present

## 2012-12-01 DIAGNOSIS — I776 Arteritis, unspecified: Secondary | ICD-10-CM | POA: Diagnosis present

## 2012-12-01 DIAGNOSIS — Z79899 Other long term (current) drug therapy: Secondary | ICD-10-CM

## 2012-12-01 DIAGNOSIS — G629 Polyneuropathy, unspecified: Secondary | ICD-10-CM

## 2012-12-01 DIAGNOSIS — T451X5A Adverse effect of antineoplastic and immunosuppressive drugs, initial encounter: Secondary | ICD-10-CM | POA: Diagnosis present

## 2012-12-01 DIAGNOSIS — I10 Essential (primary) hypertension: Secondary | ICD-10-CM | POA: Diagnosis present

## 2012-12-01 DIAGNOSIS — G8929 Other chronic pain: Secondary | ICD-10-CM | POA: Diagnosis present

## 2012-12-01 LAB — COMPREHENSIVE METABOLIC PANEL
Alkaline Phosphatase: 32 U/L — ABNORMAL LOW (ref 39–117)
BUN: 20 mg/dL (ref 6–23)
Calcium: 9 mg/dL (ref 8.4–10.5)
GFR calc Af Amer: >90 mL/min (ref >90)
Glucose, Bld: 106 mg/dL — ABNORMAL HIGH (ref 70–99)
Potassium: 3.8 mEq/L (ref 3.5–5.1)
Total Protein: 10.5 g/dL — ABNORMAL HIGH (ref 6.0–8.3)

## 2012-12-01 LAB — BLOOD GAS, ARTERIAL
Acid-base deficit: 0.7 mmol/L (ref 0.0–2.0)
Bicarbonate: 22 mEq/L (ref 20.0–24.0)
TCO2: 20.5 mmol/L (ref 0–100)
pCO2 arterial: 30.2 mmHg — ABNORMAL LOW (ref 35.0–45.0)
pH, Arterial: 7.476 — ABNORMAL HIGH (ref 7.350–7.450)
pO2, Arterial: 83 mmHg (ref 80.0–100.0)

## 2012-12-01 LAB — URINALYSIS, ROUTINE W REFLEX MICROSCOPIC
Bilirubin Urine: NEGATIVE
Hgb urine dipstick: NEGATIVE
Protein, ur: 100 mg/dL — AB
Urobilinogen, UA: 1 mg/dL (ref 0.0–1.0)

## 2012-12-01 LAB — URINE MICROSCOPIC-ADD ON

## 2012-12-01 MED ORDER — ALBUTEROL SULFATE (5 MG/ML) 0.5% IN NEBU
2.5000 mg | INHALATION_SOLUTION | RESPIRATORY_TRACT | Status: DC
  Administered 2012-12-01 – 2012-12-02 (×7): 2.5 mg via RESPIRATORY_TRACT
  Filled 2012-12-01 (×7): qty 0.5

## 2012-12-01 MED ORDER — SODIUM CHLORIDE 0.9 % IV SOLN
20.0000 mL | INTRAVENOUS | Status: DC
  Administered 2012-12-01: 20 mL via INTRAVENOUS

## 2012-12-01 MED ORDER — ACETAMINOPHEN 500 MG PO TABS
1000.0000 mg | ORAL_TABLET | Freq: Once | ORAL | Status: AC
  Administered 2012-12-01: 1000 mg via ORAL
  Filled 2012-12-01: qty 2

## 2012-12-01 MED ORDER — GABAPENTIN 300 MG PO CAPS
600.0000 mg | ORAL_CAPSULE | Freq: Three times a day (TID) | ORAL | Status: DC
  Administered 2012-12-01 – 2012-12-07 (×16): 600 mg via ORAL
  Filled 2012-12-01 (×20): qty 2

## 2012-12-01 MED ORDER — ONDANSETRON HCL 4 MG/2ML IJ SOLN
4.0000 mg | Freq: Once | INTRAMUSCULAR | Status: AC
  Administered 2012-12-01: 4 mg via INTRAVENOUS
  Filled 2012-12-01: qty 2

## 2012-12-01 MED ORDER — MORPHINE SULFATE 4 MG/ML IJ SOLN
4.0000 mg | Freq: Once | INTRAMUSCULAR | Status: AC
  Administered 2012-12-01: 4 mg via INTRAVENOUS

## 2012-12-01 MED ORDER — IOHEXOL 350 MG/ML SOLN
100.0000 mL | Freq: Once | INTRAVENOUS | Status: AC | PRN
  Administered 2012-12-01: 100 mL via INTRAVENOUS

## 2012-12-01 MED ORDER — HYDROCHLOROTHIAZIDE 12.5 MG PO CAPS
12.5000 mg | ORAL_CAPSULE | Freq: Every day | ORAL | Status: DC
  Administered 2012-12-02 – 2012-12-04 (×3): 12.5 mg via ORAL
  Filled 2012-12-01 (×5): qty 1

## 2012-12-01 MED ORDER — LEVOFLOXACIN IN D5W 750 MG/150ML IV SOLN
750.0000 mg | Freq: Once | INTRAVENOUS | Status: AC
  Administered 2012-12-01: 750 mg via INTRAVENOUS
  Filled 2012-12-01: qty 150

## 2012-12-01 MED ORDER — MORPHINE SULFATE 4 MG/ML IJ SOLN
4.0000 mg | Freq: Once | INTRAMUSCULAR | Status: AC
  Administered 2012-12-01: 4 mg via INTRAVENOUS
  Filled 2012-12-01: qty 1

## 2012-12-01 MED ORDER — IRBESARTAN 300 MG PO TABS
300.0000 mg | ORAL_TABLET | Freq: Every day | ORAL | Status: DC
  Administered 2012-12-02 – 2012-12-05 (×4): 300 mg via ORAL
  Filled 2012-12-01 (×5): qty 1

## 2012-12-01 MED ORDER — MORPHINE SULFATE 4 MG/ML IJ SOLN
INTRAMUSCULAR | Status: AC
  Administered 2012-12-01: 4 mg via INTRAVENOUS
  Filled 2012-12-01: qty 1

## 2012-12-01 MED ORDER — HYDROCODONE-ACETAMINOPHEN 10-325 MG PO TABS
1.0000 | ORAL_TABLET | Freq: Four times a day (QID) | ORAL | Status: DC | PRN
  Administered 2012-12-03 – 2012-12-05 (×4): 2 via ORAL
  Filled 2012-12-01 (×4): qty 2

## 2012-12-01 MED ORDER — LEVOFLOXACIN IN D5W 750 MG/150ML IV SOLN
750.0000 mg | INTRAVENOUS | Status: DC
  Administered 2012-12-02 – 2012-12-04 (×2): 750 mg via INTRAVENOUS
  Filled 2012-12-01 (×3): qty 150

## 2012-12-01 MED ORDER — POMALIDOMIDE 4 MG PO CAPS: 4.0000 mg | ORAL_CAPSULE | Freq: Every day | ORAL | Status: DC

## 2012-12-01 MED ORDER — IRBESARTAN-HYDROCHLOROTHIAZIDE 300-12.5 MG PO TABS: 1.0000 | ORAL_TABLET | Freq: Every day | ORAL | Status: DC

## 2012-12-01 MED ORDER — WARFARIN - PHARMACIST DOSING INPATIENT: Freq: Every day | Status: DC

## 2012-12-01 MED ORDER — IPRATROPIUM BROMIDE 0.02 % IN SOLN
0.5000 mg | RESPIRATORY_TRACT | Status: DC
  Administered 2012-12-01 – 2012-12-02 (×6): 0.5 mg via RESPIRATORY_TRACT
  Filled 2012-12-01 (×7): qty 2.5

## 2012-12-01 MED ORDER — SODIUM CHLORIDE 0.9 % IV BOLUS (SEPSIS)
500.0000 mL | Freq: Once | INTRAVENOUS | Status: AC
  Administered 2012-12-01: 500 mL via INTRAVENOUS

## 2012-12-01 MED ORDER — ACETAMINOPHEN 500 MG PO TABS
1000.0000 mg | ORAL_TABLET | Freq: Four times a day (QID) | ORAL | Status: DC | PRN
  Administered 2012-12-05 – 2012-12-06 (×2): 1000 mg via ORAL
  Filled 2012-12-01 (×2): qty 2

## 2012-12-01 NOTE — Progress Notes (Signed)
ANTICOAGULATION CONSULT NOTE - Initial Consult  Pharmacy Consult for warfarin Indication: multiple myeloma; vte prophylaxis   Allergies  Allergen Reactions  . Red Dye Anaphylaxis    Lips swollen  3-4 times their baseline size  . Other Other (See Comments)    Strawberries "anything containing red dye"    Patient Measurements:   Heparin Dosing Weight:   Vital Signs: Temp: 98.4 F (36.9 C) (01/14 2313) Temp src: Oral (01/14 2313) BP: 107/54 mmHg (01/14 2313) Pulse Rate: 102  (01/14 2313)  Labs:  [facility] 12/01/12 1820  HGB 9.1*  HCT 26.8*  PLT 268  APTT --  LABPROT --  INR --  HEPARINUNFRC --  CREATININE 0.85  CKTOTAL --  CKMB --  TROPONINI --    The CrCl is unknown because both a height and weight (above a minimum accepted value) are required for this calculation.   Medical History: Past Medical History  Diagnosis Date  . Multiple myeloma(203.0) 09/24/2011  . Hypertension     Medications:   (Not in a hospital admission)  Assessment: Patient with multiple myeloma; and need of vte prophylaxis is on warfarin chronically.  INR is not drawn at this time.    Goal of Therapy:  INR 2-3    Plan:  Await INR Dose warfarin if needed, follow up in am  Aleene Davidson Crowford 12/01/2012,11:38 PM

## 2012-12-01 NOTE — ED Notes (Signed)
RT at bedside.

## 2012-12-01 NOTE — ED Notes (Signed)
Hospitalist at bedside speaking with pt

## 2012-12-01 NOTE — ED Notes (Signed)
[id number]<NF> Expected date:<BR> Expected time:<BR> Means of arrival:<BR> Comments:<BR> Hold for triage 2

## 2012-12-01 NOTE — ED Notes (Signed)
Patient transported to X-ray 

## 2012-12-01 NOTE — ED Notes (Addendum)
Pt placed on cardiac monitor and on 2L [place].  Blood cultures being drawn at this time

## 2012-12-01 NOTE — ED Notes (Signed)
Pt states started having L sided flank pain Monday, has worsened, states pain so bad he doesn't want to breath, no hx of kidney stones or injury, does have multiple myeloma sent from ca center, was getting blood work done today, denies urinary symptoms, denies n/v/d.

## 2012-12-01 NOTE — H&P (Addendum)
Hospitalist Admission History and Physical  Patient name: [name] Medical record number: [id number] Date of birth: 20-Jan-1961 Age: 52 y.o. Gender: male  Primary Care Provider: [name], MD  Chief Complaint: R sided rib pain, bilateral pneumonia   History of Present Illness: [name] is a 52 y.o. year old male with past medical history of multiple myeloma (currently being treated) presenting with R sided rib pain and bilateral PNA on CT. patient reports mild your eye symptoms over the past 3-4 weeks with sudden onset of right-sided rib pain today. Pt was seen for URI symptoms by oncologist and was placed on zpak for infectious coverage. Patient denies any recent falls or trauma or strenuous activity that may have precipitated the pain. No central chest pain. Patient was seen in ER, had CTA done that was negative for PE but was noted to have bilateral pneumonia with aspiration as etiology. Last hospitalization was 08/2012. Pt has not had a flu shot this season.  Pt is currently being followed by Dr. Shirline Frees for multiple myeloma. Initially diagnosed in 2008 with multiple rounds of chemotx and stem cell treatment with good response. Noted evidence of recurrence in 10/2009. Please see office progress note from 11/23/12 for full details.    Patient Active Problem List  Diagnosis  . Multiple myeloma  . Vasculitis  . Chronic anticoagulation  . Chronic pain  . Hypertension  . Bacteremia   Past Medical History: Past Medical History  Diagnosis Date  . Multiple myeloma(203.0) 09/24/2011  . Hypertension     Past Surgical History: History reviewed. No pertinent past surgical history.  Social History: History   Social History  . Marital Status: Married    Spouse Name: N/A    Number of Children: N/A  . Years of Education: N/A   Social History Main Topics  . Smoking status: Never Smoker   . Smokeless tobacco: Never Used  . Alcohol Use: No  . Drug Use: No  .  Sexually Active:    Other Topics Concern  . None   Social History Narrative  . None    Family History: History reviewed. No pertinent family history.  Allergies: Allergies  Allergen Reactions  . Red Dye Anaphylaxis    Lips swollen  3-4 times their baseline size  . Other Other (See Comments)    Strawberries "anything containing red dye"    Current Facility-Administered Medications  Medication Dose Route Frequency Provider Last Rate Last Dose  . 0.9 %  sodium chloride infusion  20 mL Intravenous Continuous Hurman Horn, MD 125 mL/hr at 12/01/12 1734 20 mL at 12/01/12 1734  . acetaminophen (TYLENOL) tablet 1,000 mg  1,000 mg Oral Q6H PRN Doree Albee, MD      . ipratropium (ATROVENT) nebulizer solution 0.5 mg  0.5 mg Nebulization Q4H Doree Albee, MD   0.5 mg at 12/01/12 2307   And  . albuterol (PROVENTIL) (5 MG/ML) 0.5% nebulizer solution 2.5 mg  2.5 mg Nebulization Q4H Doree Albee, MD   2.5 mg at 12/01/12 2307  . gabapentin (NEURONTIN) capsule 600 mg  600 mg Oral TID Doree Albee, MD   600 mg at 12/01/12 2334  . hydrochlorothiazide (MICROZIDE) capsule 12.5 mg  12.5 mg Oral Daily Gilda Crease, MD      . HYDROcodone-acetaminophen [facility]) 10-325 MG per tablet 1-2 tablet  1-2 tablet Oral Q6H PRN Doree Albee, MD      . irbesartan (AVAPRO) tablet 300 mg  300 mg Oral Daily Canary Brim.  Pollina, MD      . levofloxacin (LEVAQUIN) IVPB 750 mg  750 mg Intravenous Q24H Doree Albee, MD      . pomalidomide (POMALYST) capsule 4 mg  4 mg Oral Daily Doree Albee, MD      . Warfarin - Pharmacist Dosing Inpatient   Does not apply [id number] Gilda Crease, MD       Current Outpatient Prescriptions  Medication Sig Dispense Refill  . acetaminophen (TYLENOL) 500 MG tablet Take 1,000 mg by mouth every 6 (six) hours as needed. For pain/fever.      . cyclophosphamide (CYTOXAN) 50 MG tablet Take 50 mg by mouth every other day.       [name] dexamethasone (DECADRON) 4 MG tablet Take  40 mg by mouth every 7 (seven) days.       [name] gabapentin (NEURONTIN) 300 MG capsule Take 600 mg by mouth 3 (three) times daily.      [name] HYDROcodone-acetaminophen (NORCO) 10-325 MG per tablet Take 1-2 tablets by mouth every 6 (six) hours as needed for pain. For pain.  45 tablet  0  . irbesartan-hydrochlorothiazide (AVALIDE) 300-12.5 MG per tablet Take 1 tablet by mouth daily with breakfast.      . pomalidomide (POMALYST) 4 MG capsule Take 4 mg by mouth daily. Take with water on days 1-21. Repeat every 28 days.      [name] warfarin (COUMADIN) 5 MG tablet Take 5 mg by mouth daily.       Review Of Systems: 12 point ROS negative except as noted above in HPI Physical Exam: Filed Vitals:   12/01/12 2313  BP: 107/54  Pulse: 102  Temp: 98.4 F (36.9 C)  Resp: 20    General: alert, cooperative and febrile/diaphoretic  HEENT: PERRLA, extra ocular movement intact and +nasal erythema, rhinorrhea bilaterally, + post oropharyngeal erythema  Heart: S1, S2 normal, no murmur, rub or gallop, regular rate and rhythm Lungs: decreased breath sounds diffusely, faint wheezes in bases  Abdomen: obese, non tender, + bowel sounds.  Extremities: extremities normal, atraumatic, no cyanosis or edema Skin:no rashes, no ecchymoses Neurology: normal without focal findings  Labs and Imaging: Lab Results  Component Value Date/Time   NA 125* 12/01/2012  6:20 PM   NA 128* 11/23/2012  9:14 AM   K 3.8 12/01/2012  6:20 PM   K 4.2 11/23/2012  9:14 AM   CL 92* 12/01/2012  6:20 PM   CL 97* 11/23/2012  9:14 AM   CO2 20 12/01/2012  6:20 PM   CO2 24 11/23/2012  9:14 AM   BUN 20 12/01/2012  6:20 PM   BUN 11.0 11/23/2012  9:14 AM   CREATININE 0.85 12/01/2012  6:20 PM   CREATININE 0.7 11/23/2012  9:14 AM   GLUCOSE 106* 12/01/2012  6:20 PM   GLUCOSE 87 11/23/2012  9:14 AM   Lab Results  Component Value Date   WBC 8.7 12/01/2012   HGB 9.1* 12/01/2012   HCT 26.8* 12/01/2012   MCV 96.4 12/01/2012   PLT 268 12/01/2012   Dg Ribs Unilateral W/chest  Right  12/01/2012  *RADIOLOGY REPORT*  Clinical Data: Pain in the right lower posterior rib area.  No history of injury but the patient has been coughing.  RIGHT RIBS AND CHEST - 3+ VIEW  Comparison: 09/04/2012.  Findings: There is elevation of the right hemidiaphragm.  There is patchy infiltrative density and atelectasis in the right medial base.  No consolidation, pleural effusion, or pneumothorax is evident.  There is some  central peribronchial thickening.  There is mild vascular congestion pattern.  Cardiac silhouette is upper normal size.  There is minimal degenerative spondylosis.  No rib fracture or rib lesion is evident.  IMPRESSION: Elevation of the right hemidiaphragm with patchy infiltrative density and atelectasis in the medial right base. This may reflect a small area of pneumonia but no consolidation or pleural effusion is evident.  Central peribronchial thickening is present.  This may be associated with bronchitis, peribronchial pneumonitis, asthma, and reactive airway disease. There is mild upper lobe vessel prominence vascular congestion pattern.   Original Report Authenticated By: Onalee Hua Call    Ct Angio Chest W/cm &/or Wo Cm  12/01/2012  *RADIOLOGY REPORT*  Clinical Data: Right chest pain, multiple myeloma.  CT ANGIOGRAPHY CHEST  Technique:  Multidetector CT imaging of the chest using the standard protocol during bolus administration of intravenous contrast. Multiplanar reconstructed images including MIPs were obtained and reviewed to evaluate the vascular anatomy.  Contrast: OMNIPAQUE IOHEXOL 350 MG/ML SOLN  Comparison: None.  Findings: There is moderately good contrast opacification of pulmonary artery branches with no convincing filling defects to suggest acute PE. Adequate contrast opacification of the thoracic aorta with no evidence of dissection, aneurysm, or stenosis. There is classic 3-vessel brachiocephalic arch anatomy.  No hilar or mediastinal adenopathy.  Small right pleural  effusion.  No pericardial effusion.  Patchy alveolar opacities in the posterior left upper lobe and superior segment left lower lobe.  More confluent patchy airspace consolidation in the posterior, medial, and lateral basal segments right lower lobe.  Diffuse fatty infiltration of the liver.  Remainder visualized upper abdomen unremarkable.  Multiple lytic lesions in the sternum.  There is a lucent lesion in the   T3 vertebral body with mild superior endplate compression fracture deformity, age indeterminate.  IMPRESSION:  1.  Negative for acute PE or thoracic aortic dissection. 2.  Patchy bilateral airspace disease as above, suggesting aspiration pneumonia. 3. Small right pleural effusion. 4.  Lytic lesions in the sternum and T3 vertebral body. 5.  Fatty liver   Original Report Authenticated By: D. Andria Rhein, MD       Assessment and Plan: [name] is a 52 y.o. year old male with past medical history of multiple myeloma currently on chemotherapy presenting with R sided rib pain and bilateral aspiration pneumonia.  Respiratory/ID: Bilateral aspiration pneumonia on CT imaging. Will place patient on Levaquin and Zosyn for aspiration coverage in the setting of immunocompromised state and stepdown status on admission. Blood cultures obtained. Will also obtain sputum cultures. Procalcitonin obtained. Patient also with noted wheezing as well as obstructive lung disease changes on imaging. Will place patient on IV Solu-Medrol for the next 24 hours. Patient did have a symptomatic improvement in overall breathing with a DuoNeb treatment. Will continue this while in house. Symptoms are mildly flu like in setting of pt with baseline immunocompromised state and no flu shot this season. Will start on tamiflu empirically. Continue supplemental O2 as needed.  Cardiovascular: BPs stable. Continue home medication. INR supratherapeutic. Coumadin per pharmacy.   Hem/Onc; Will continue home chemotherapy regimen.  Noted lytic lesions in sternum and T3 vertebral body. Unclear if these are new. Vs. Old lesions. Will consult oncology in the am. Hgb stable at 9. There was initially some concern about patient having febrile neutropenia as the initial CBC with differential showed no neutrophils. The lab was called. A peripheral smear was in process that is showing 80% neutrophils. Patient does meet SIRS  criteria including tachycardia and fever. Zosyn will give coverage for febrile neutropenia in the interim as peripheral smear is being finalized.  MSK: Rib pain seems more in the distribution of pneumonia. However, this may be related to lytic lesions in sternum and T3 vertebral body. Home percocet and morphine 4mg  IV q2hours prn pain in the interim. Continue home neurontin.  FEN/GI: Noted sodium at 125 today. Subacute to chronic in review of recent chemistries. Suspect this may be secondary to medications vs. Maligancy. ContinueNS @ maintenance rate.   Prophylaxis: on coumadin Disposition: Pending further evaluation.  Code Status: Full Code.

## 2012-12-01 NOTE — ED Provider Notes (Signed)
History     CSN: [id number]  Arrival date & time 12/01/12  1524   First MD Initiated Contact with Patient 12/01/12 1556      Chief Complaint  Patient presents with  . Flank Pain    (Consider location/radiation/quality/duration/timing/severity/associated sxs/prior treatment) HPI Comments: Patient presents with complaints of right posterior rib and flank pain which started Monday. Patient reports that the pain has progressively worsened. He denies injury. He has had a cough, no fever. He has not had any urinary symptoms including hematuria. Patient denies nausea, vomiting and diarrhea.  Patient is a 52 y.o. male presenting with flank pain.  Flank Pain    Past Medical History  Diagnosis Date  . Multiple myeloma(203.0) 09/24/2011  . Hypertension     History reviewed. No pertinent past surgical history.  History reviewed. No pertinent family history.  History  Substance Use Topics  . Smoking status: Never Smoker   . Smokeless tobacco: Never Used  . Alcohol Use: No      Review of Systems  Constitutional: Negative for fever.  Respiratory: Positive for cough and stridor.   Genitourinary: Positive for flank pain.  All other systems reviewed and are negative.    Allergies  Red dye and Other  Home Medications   Current Outpatient Rx  Name  Route  Sig  Dispense  Refill  . ACETAMINOPHEN 500 MG PO TABS   Oral   Take 1,000 mg by mouth every 6 (six) hours as needed. For pain/fever.         . AZITHROMYCIN 250 MG PO TABS      Take 2 tablets by mouth on day one, then take 1 tablet by mouth daily until completed   6 each   0   . CYCLOPHOSPHAMIDE 50 MG PO TABS               . DEXAMETHASONE 4 MG PO TABS               . GABAPENTIN 300 MG PO CAPS   Oral   Take 1 capsule (300 mg total) by mouth 3 (three) times daily.   90 capsule   2     Pt prefers 100mg  tablets vs taking (1) 300mg  table ...   . HYDROCODONE-ACETAMINOPHEN 10-325 MG PO TABS   Oral   Take  1-2 tablets by mouth every 6 (six) hours as needed for pain. For pain.   45 tablet   0   . IRBESARTAN-HYDROCHLOROTHIAZIDE 300-12.5 MG PO TABS   Oral   Take 1 tablet by mouth daily with breakfast.         . MORPHINE SULFATE 15 MG PO TABS   Oral   Take 1 tablet (15 mg total) by mouth every 4 (four) hours as needed for pain. For pain   30 tablet   0   . OMEPRAZOLE 40 MG PO CPDR               . POMALIDOMIDE 4 MG PO CAPS      Take with water on days 1-21. Repeat every 28 days.   21 capsule   0     AUTHORIZATION #[id number] 11/20/12   . WARFARIN SODIUM 5 MG PO TABS      Take 5 mg PO daily except 7.5 mg on Monday, Wednesday, Friday or as directed   50 tablet   2     BP 118/75  Pulse 126  Temp 101.9 F (38.8 C) (Oral)  Resp 24  SpO2 96%  Physical Exam  Constitutional: He is oriented to person, place, and time. He appears well-developed and well-nourished. He appears distressed.  HENT:  Head: Normocephalic and atraumatic.  Right Ear: Hearing normal.  Nose: Nose normal.  Mouth/Throat: Oropharynx is clear and moist and mucous membranes are normal.  Eyes: Conjunctivae normal and EOM are normal. Pupils are equal, round, and reactive to light.  Neck: Normal range of motion. Neck supple.  Cardiovascular: Normal rate, regular rhythm, S1 normal and S2 normal.  Exam reveals no gallop and no friction rub.   No murmur heard. Pulmonary/Chest: Effort normal and breath sounds normal. No respiratory distress.   He exhibits tenderness.  Abdominal: Soft. Normal appearance and bowel sounds are normal. There is no hepatosplenomegaly. There is no tenderness. There is no rebound, no guarding, no tenderness at McBurney's point and negative Murphy's sign. No hernia.  Musculoskeletal: Normal range of motion.  Neurological: He is alert and oriented to person, place, and time. He has normal strength. No cranial nerve deficit or sensory deficit. Coordination normal. GCS eye subscore is 4. GCS  verbal subscore is 5. GCS motor subscore is 6.  Skin: Skin is warm, dry and intact. No rash noted. No cyanosis.  Psychiatric: He has a normal mood and affect. His speech is normal and behavior is normal. Thought content normal.    ED Course  Procedures (including critical care time)  Labs Reviewed  CBC WITH DIFFERENTIAL - Abnormal; Notable for the following:    RBC 2.78 (*)     Hemoglobin 9.1 (*)     HCT 26.8 (*)     RDW 18.2 (*)     nRBC 1 (*)     Neutrophils Relative 0 (*)     Lymphocytes Relative 0 (*)     Monocytes Relative 0 (*)     Neutro Abs 0.0 (*)     Lymphs Abs 0.0 (*)     Monocytes Absolute 0.0 (*)     All other components within normal limits  URINALYSIS, ROUTINE W REFLEX MICROSCOPIC - Abnormal; Notable for the following:    Color, Urine AMBER (*)  BIOCHEMICALS MAY BE AFFECTED BY COLOR   APPearance CLOUDY (*)     Protein, ur 100 (*)     All other components within normal limits  COMPREHENSIVE METABOLIC PANEL - Abnormal; Notable for the following:    Sodium 125 (*)     Chloride 92 (*)     Glucose, Bld 106 (*)     Total Protein 10.5 (*)     Albumin 2.6 (*)     Alkaline Phosphatase 32 (*)     All other components within normal limits  URINE MICROSCOPIC-ADD ON - Abnormal; Notable for the following:    Squamous Epithelial / LPF FEW (*)     Bacteria, UA FEW (*)     All other components within normal limits  CULTURE, BLOOD (ROUTINE X 2)  CULTURE, BLOOD (ROUTINE X 2)   Dg Ribs Unilateral W/chest Right  12/01/2012  *RADIOLOGY REPORT*  Clinical Data: Pain in the right lower posterior rib area.  No history of injury but the patient has been coughing.  RIGHT RIBS AND CHEST - 3+ VIEW  Comparison: 09/04/2012.  Findings: There is elevation of the right hemidiaphragm.  There is patchy infiltrative density and atelectasis in the right medial base.  No consolidation, pleural effusion, or pneumothorax is evident.  There is some central peribronchial thickening.  There is mild  vascular congestion pattern.  Cardiac silhouette is upper normal size.  There is minimal degenerative spondylosis.  No rib fracture or rib lesion is evident.  IMPRESSION: Elevation of the right hemidiaphragm with patchy infiltrative density and atelectasis in the medial right base. This may reflect a small area of pneumonia but no consolidation or pleural effusion is evident.  Central peribronchial thickening is present.  This may be associated with bronchitis, peribronchial pneumonitis, asthma, and reactive airway disease. There is mild upper lobe vessel prominence vascular congestion pattern.   Original Report Authenticated By: Onalee Hua Call    Ct Angio Chest W/cm &/or Wo Cm  12/01/2012  *RADIOLOGY REPORT*  Clinical Data: Right chest pain, multiple myeloma.  CT ANGIOGRAPHY CHEST  Technique:  Multidetector CT imaging of the chest using the standard protocol during bolus administration of intravenous contrast. Multiplanar reconstructed images including MIPs were obtained and reviewed to evaluate the vascular anatomy.  Contrast: OMNIPAQUE IOHEXOL 350 MG/ML SOLN  Comparison: None.  Findings: There is moderately good contrast opacification of pulmonary artery branches with no convincing filling defects to suggest acute PE. Adequate contrast opacification of the thoracic aorta with no evidence of dissection, aneurysm, or stenosis. There is classic 3-vessel brachiocephalic arch anatomy.  No hilar or mediastinal adenopathy.  Small right pleural effusion.  No pericardial effusion.  Patchy alveolar opacities in the posterior left upper lobe and superior segment left lower lobe.  More confluent patchy airspace consolidation in the posterior, medial, and lateral basal segments right lower lobe.  Diffuse fatty infiltration of the liver.  Remainder visualized upper abdomen unremarkable.  Multiple lytic lesions in the sternum.  There is a lucent lesion in the   T3 vertebral body with mild superior endplate compression  fracture deformity, age indeterminate.  IMPRESSION:  1.  Negative for acute PE or thoracic aortic dissection. 2.  Patchy bilateral airspace disease as above, suggesting aspiration pneumonia. 3. Small right pleural effusion. 4.  Lytic lesions in the sternum and T3 vertebral body. 5.  Fatty liver   Original Report Authenticated By: D. Andria Rhein, MD      Diagnosis: Pneumonia    MDM  Patient presents to ER for evaluation of right flank pain. Patient was in some distress on arrival due to the pain. Pain was in the posterior lower rib margin area. He had slight tenderness in the area but it hurt more when he moved. Patient does endorse a cough. Chest x-ray showed a small area that could represent pneumonia which was felt to likely explain the patient's symptoms. He was administered pain medication but did not get any relief and this was repeated. During evaluation patient became more uncomfortable. He started to run a fever and become more cachectic. Patient therefore was sent for CT scan to rule out PE which was negative, does show bilateral basilar infiltrates. Patient will require hospitalization for further treatment of pneumonia.        Gilda Crease, MD 12/01/12 [phone]

## 2012-12-01 NOTE — ED Notes (Signed)
Charge RN notified about need of 20G IV above wrist for CT Angio.

## 2012-12-01 NOTE — ED Notes (Addendum)
RT notified of pt's duo neb order, states she will be here in a few minutes.

## 2012-12-01 NOTE — ED Notes (Signed)
Unable to draw lab on pt because pt is too agitated and in pain. MD made aware.

## 2012-12-01 NOTE — ED Notes (Signed)
Pt hyperventilating. Unable to access

## 2012-12-02 ENCOUNTER — Encounter (HOSPITAL_COMMUNITY): Payer: Self-pay

## 2012-12-02 ENCOUNTER — Telehealth: Payer: Self-pay | Admitting: *Deleted

## 2012-12-02 DIAGNOSIS — I776 Arteritis, unspecified: Secondary | ICD-10-CM

## 2012-12-02 DIAGNOSIS — J189 Pneumonia, unspecified organism: Secondary | ICD-10-CM | POA: Diagnosis present

## 2012-12-02 DIAGNOSIS — I10 Essential (primary) hypertension: Secondary | ICD-10-CM

## 2012-12-02 LAB — COMPREHENSIVE METABOLIC PANEL
BUN: 16 mg/dL (ref 6–23)
CO2: 21 mEq/L (ref 19–32)
Calcium: 9.1 mg/dL (ref 8.4–10.5)
Chloride: 94 mEq/L — ABNORMAL LOW (ref 96–112)
Creatinine, Ser: 0.69 mg/dL (ref 0.50–1.35)
GFR calc Af Amer: >90 mL/min (ref >90)
GFR calc non Af Amer: >90 mL/min (ref >90)
Total Bilirubin: 1.4 mg/dL — ABNORMAL HIGH (ref 0.3–1.2)

## 2012-12-02 LAB — CBC
Hemoglobin: 8.2 g/dL — ABNORMAL LOW (ref 13.0–17.0)
Platelets: 236 10*3/uL (ref 150–400)
RBC: 2.47 MIL/uL — ABNORMAL LOW (ref 4.22–5.81)
WBC: 6.4 10*3/uL (ref 4.0–10.5)

## 2012-12-02 LAB — MRSA PCR SCREENING: MRSA by PCR: POSITIVE — AB

## 2012-12-02 LAB — CBC WITH DIFFERENTIAL/PLATELET
Band Neutrophils: 0 % (ref 0–10)
Basophils Absolute: 0 10*3/uL (ref 0.0–0.1)
Basophils Relative: 0 % (ref 0–1)
Eosinophils Absolute: 0 10*3/uL (ref 0.0–0.7)
HCT: 26.8 % — ABNORMAL LOW (ref 39.0–52.0)
Lymphocytes Relative: 12 % (ref 12–46)
MCV: 96.4 fL (ref 78.0–100.0)
Monocytes Absolute: 0.4 10*3/uL (ref 0.1–1.0)
Monocytes Relative: 5 % (ref 3–12)
RBC: 2.78 MIL/uL — ABNORMAL LOW (ref 4.22–5.81)
WBC Morphology: INCREASED
WBC: 8.7 10*3/uL (ref 4.0–10.5)

## 2012-12-02 LAB — INFLUENZA PANEL BY PCR (TYPE A & B)
H1N1 flu by pcr: NOT DETECTED
Influenza A By PCR: NEGATIVE
Influenza B By PCR: NEGATIVE

## 2012-12-02 LAB — LEGIONELLA ANTIGEN, URINE

## 2012-12-02 LAB — STREP PNEUMONIAE URINARY ANTIGEN: Strep Pneumo Urinary Antigen: NEGATIVE

## 2012-12-02 MED ORDER — POMALIDOMIDE 4 MG PO CAPS: 4.0000 mg | ORAL_CAPSULE | Freq: Every day | ORAL | Status: DC

## 2012-12-02 MED ORDER — DEXAMETHASONE 6 MG PO TABS
40.0000 mg | ORAL_TABLET | ORAL | Status: DC
  Filled 2012-12-02: qty 1

## 2012-12-02 MED ORDER — MORPHINE SULFATE 4 MG/ML IJ SOLN
4.0000 mg | INTRAMUSCULAR | Status: DC | PRN
  Administered 2012-12-02 – 2012-12-04 (×6): 4 mg via INTRAVENOUS
  Filled 2012-12-02 (×6): qty 1

## 2012-12-02 MED ORDER — WARFARIN SODIUM 7.5 MG PO TABS
7.5000 mg | ORAL_TABLET | Freq: Once | ORAL | Status: AC
  Administered 2012-12-02: 7.5 mg via ORAL
  Filled 2012-12-02: qty 1

## 2012-12-02 MED ORDER — OSELTAMIVIR PHOSPHATE 75 MG PO CAPS
75.0000 mg | ORAL_CAPSULE | Freq: Two times a day (BID) | ORAL | Status: DC
  Administered 2012-12-02 (×3): 75 mg via ORAL
  Filled 2012-12-02 (×5): qty 1

## 2012-12-02 MED ORDER — ALBUTEROL SULFATE (5 MG/ML) 0.5% IN NEBU
2.5000 mg | INHALATION_SOLUTION | RESPIRATORY_TRACT | Status: DC
  Administered 2012-12-02: 2.5 mg via RESPIRATORY_TRACT
  Filled 2012-12-02: qty 0.5

## 2012-12-02 MED ORDER — CHLORHEXIDINE GLUCONATE CLOTH 2 % EX PADS: 6.0000 | MEDICATED_PAD | Freq: Every day | CUTANEOUS | Status: DC

## 2012-12-02 MED ORDER — PIPERACILLIN-TAZOBACTAM 3.375 G IVPB 30 MIN
3.3750 g | Freq: Once | INTRAVENOUS | Status: AC
  Administered 2012-12-02: 3.375 g via INTRAVENOUS
  Filled 2012-12-02: qty 50

## 2012-12-02 MED ORDER — IPRATROPIUM BROMIDE 0.02 % IN SOLN
0.5000 mg | RESPIRATORY_TRACT | Status: DC
  Administered 2012-12-02: 0.5 mg via RESPIRATORY_TRACT

## 2012-12-02 MED ORDER — IPRATROPIUM BROMIDE 0.02 % IN SOLN: 0.5000 mg | RESPIRATORY_TRACT | Status: DC | PRN

## 2012-12-02 MED ORDER — MUPIROCIN 2 % EX OINT
1.0000 "application " | TOPICAL_OINTMENT | Freq: Two times a day (BID) | CUTANEOUS | Status: AC
  Administered 2012-12-02 – 2012-12-07 (×10): 1 via NASAL
  Filled 2012-12-02: qty 22

## 2012-12-02 MED ORDER — SODIUM CHLORIDE 0.9 % IV SOLN
INTRAVENOUS | Status: DC
  Administered 2012-12-02: 21:00:00 via INTRAVENOUS
  Administered 2012-12-02: 200 mL via INTRAVENOUS
  Administered 2012-12-03: 1000 mL via INTRAVENOUS

## 2012-12-02 MED ORDER — CYCLOPHOSPHAMIDE 50 MG PO TABS: 50.0000 mg | ORAL_TABLET | ORAL | Status: DC

## 2012-12-02 MED ORDER — METHYLPREDNISOLONE SODIUM SUCC 125 MG IJ SOLR
125.0000 mg | Freq: Three times a day (TID) | INTRAMUSCULAR | Status: DC
  Administered 2012-12-02 (×2): 125 mg via INTRAVENOUS
  Filled 2012-12-02 (×4): qty 2

## 2012-12-02 MED ORDER — PIPERACILLIN-TAZOBACTAM 3.375 G IVPB
3.3750 g | Freq: Three times a day (TID) | INTRAVENOUS | Status: DC
  Administered 2012-12-02 – 2012-12-04 (×7): 3.375 g via INTRAVENOUS
  Filled 2012-12-02 (×9): qty 50

## 2012-12-02 MED ORDER — ALBUTEROL SULFATE (5 MG/ML) 0.5% IN NEBU: 2.5000 mg | INHALATION_SOLUTION | RESPIRATORY_TRACT | Status: DC | PRN

## 2012-12-02 NOTE — Progress Notes (Signed)
[facility]             Progress Note   Subjective: Cough, congestion and fevers for 2-3 days, severe R sided chest pain  Objective: Vital signs in last 24 hours: Temp:  [97.8 F (36.6 C)-101.9 F (38.8 C)] 98.1 F (36.7 C) (01/15 1027) Pulse Rate:  [86-128] 86  (01/15 0821) Resp:  [18-26] 18  (01/15 0821) BP: (107-153)/(49-99) 127/67 mmHg (01/15 1027) SpO2:  [91 %-100 %] 98 % (01/15 0901) Weight:  [102.8 kg (226 lb 10.1 oz)-104.327 kg (230 lb)] 102.8 kg (226 lb 10.1 oz) (01/15 1033) Weight change:     Intake/Output from previous day: 01/14 0701 - 01/15 0700 In: -  Out: 400 [Urine:400] Total I/O In: -  Out: 500 [Urine:500]   Physical Exam: General: Alert, awake, oriented x3, in no acute distress. HEENT: No bruits, no goiter. Heart: Regular rate and rhythm, without murmurs, rubs, gallops. Lungs: bilateral ronchi. Abdomen: Soft, nontender, nondistended, positive bowel sounds. Extremities: No clubbing cyanosis or edema with positive pedal pulses. Neuro: Grossly intact, nonfocal.    Lab Results: Basic Metabolic Panel:  Basename 12/02/12 0555 12/01/12 1820  NA 126* 125*  K 3.7 3.8  CL 94* 92*  CO2 21 20  GLUCOSE 125* 106*  BUN 16 20  CREATININE 0.69 0.85  CALCIUM 9.1 9.0  MG -- --  PHOS -- --   Liver Function Tests:  [facility] 12/02/12 0555 12/01/12 1820  AST 14 12  ALT 13 15  ALKPHOS 30* 32*  BILITOT 1.4* 1.1  PROT 9.8* 10.5*  ALBUMIN 2.3* 2.6*   No results found for this basename: LIPASE:2,AMYLASE:2 in the last 72 hours No results found for this basename: AMMONIA:2 in the last 72 hours CBC:  Basename 12/02/12 0555 12/01/12 1820  WBC 6.4 8.7  NEUTROABS -- 7.3  HGB 8.2* 9.1*  HCT 23.9* 26.8*  MCV 96.8 96.4  PLT 236 268   Cardiac Enzymes: No results found for this basename: CKTOTAL:3,CKMB:3,CKMBINDEX:3,TROPONINI:3 in the last 72 hours BNP: No results found for this basename: PROBNP:3 in the last 72 hours D-Dimer: No results  found for this basename: DDIMER:2 in the last 72 hours CBG: No results found for this basename: GLUCAP:6 in the last 72 hours Hemoglobin A1C: No results found for this basename: HGBA1C in the last 72 hours Fasting Lipid Panel: No results found for this basename: CHOL,HDL,LDLCALC,TRIG,CHOLHDL,LDLDIRECT in the last 72 hours Thyroid Function Tests: No results found for this basename: TSH,T4TOTAL,FREET4,T3FREE,THYROIDAB in the last 72 hours Anemia Panel: No results found for this basename: VITAMINB12,FOLATE,FERRITIN,TIBC,IRON,RETICCTPCT in the last 72 hours Coagulation:  Basename 12/02/12 0555  LABPROT 20.7*  INR 1.85*   Urine Drug Screen: Drugs of Abuse  No results found for this basename: labopia,  cocainscrnur,  labbenz,  amphetmu,  thcu,  labbarb    Alcohol Level: No results found for this basename: ETH:2 in the last 72 hours Urinalysis:  Basename 12/01/12 1629  COLORURINE AMBER*  LABSPEC 1.026  PHURINE 5.5  GLUCOSEU NEGATIVE  HGBUR NEGATIVE  BILIRUBINUR NEGATIVE  KETONESUR NEGATIVE  PROTEINUR 100*  UROBILINOGEN 1.0  NITRITE NEGATIVE  LEUKOCYTESUR NEGATIVE    No results found for this or any previous visit (from the past 240 hour(s)).  Studies/Results: Dg Ribs Unilateral W/chest Right  12/01/2012  *RADIOLOGY REPORT*  Clinical Data: Pain in the right lower posterior rib area.  No history of injury but the patient has been coughing.  RIGHT RIBS AND CHEST - 3+ VIEW  Comparison: 09/04/2012.  Findings: There  is elevation of the right hemidiaphragm.  There is patchy infiltrative density and atelectasis in the right medial base.  No consolidation, pleural effusion, or pneumothorax is evident.  There is some central peribronchial thickening.  There is mild vascular congestion pattern.  Cardiac silhouette is upper normal size.  There is minimal degenerative spondylosis.  No rib fracture or rib lesion is evident.  IMPRESSION: Elevation of the right hemidiaphragm with patchy  infiltrative density and atelectasis in the medial right base. This may reflect a small area of pneumonia but no consolidation or pleural effusion is evident.  Central peribronchial thickening is present.  This may be associated with bronchitis, peribronchial pneumonitis, asthma, and reactive airway disease. There is mild upper lobe vessel prominence vascular congestion pattern.   Original Report Authenticated By: Onalee Hua Call    Ct Angio Chest W/cm &/or Wo Cm  12/01/2012  *RADIOLOGY REPORT*  Clinical Data: Right chest pain, multiple myeloma.  CT ANGIOGRAPHY CHEST  Technique:  Multidetector CT imaging of the chest using the standard protocol during bolus administration of intravenous contrast. Multiplanar reconstructed images including MIPs were obtained and reviewed to evaluate the vascular anatomy.  Contrast: OMNIPAQUE IOHEXOL 350 MG/ML SOLN  Comparison: None.  Findings: There is moderately good contrast opacification of pulmonary artery branches with no convincing filling defects to suggest acute PE. Adequate contrast opacification of the thoracic aorta with no evidence of dissection, aneurysm, or stenosis. There is classic 3-vessel brachiocephalic arch anatomy.  No hilar or mediastinal adenopathy.  Small right pleural effusion.  No pericardial effusion.  Patchy alveolar opacities in the posterior left upper lobe and superior segment left lower lobe.  More confluent patchy airspace consolidation in the posterior, medial, and lateral basal segments right lower lobe.  Diffuse fatty infiltration of the liver.  Remainder visualized upper abdomen unremarkable.  Multiple lytic lesions in the sternum.  There is a lucent lesion in the   T3 vertebral body with mild superior endplate compression fracture deformity, age indeterminate.  IMPRESSION:  1.  Negative for acute PE or thoracic aortic dissection. 2.  Patchy bilateral airspace disease as above, suggesting aspiration pneumonia. 3. Small right pleural effusion.  4.  Lytic lesions in the sternum and T3 vertebral body. 5.  Fatty liver   Original Report Authenticated By: D. Andria Rhein, MD     Medications: Scheduled Meds:    . ipratropium  0.5 mg Nebulization Q4H   And  . albuterol  2.5 mg Nebulization Q4H  . gabapentin  600 mg Oral TID  . hydrochlorothiazide  12.5 mg Oral Daily  . irbesartan  300 mg Oral Daily  . levofloxacin (LEVAQUIN) IV  750 mg Intravenous Q24H  . methylPREDNISolone sodium succinate  125 mg Intravenous Q8H  . oseltamivir  75 mg Oral BID  . piperacillin-tazobactam (ZOSYN)  IV  3.375 g Intravenous Q8H  . pomalidomide  4 mg Oral Daily  . Warfarin - Pharmacist Dosing Inpatient   Does not apply q1800   Continuous Infusions:    . sodium chloride Stopped (12/02/12 0416)  . sodium chloride 200 mL (12/02/12 0418)   PRN Meds:.acetaminophen, HYDROcodone-acetaminophen, morphine injection  Assessment/Plan:  1. Aspiration pneumonia Continue Zosyn and levaquin FU cultures Swallow eval Albuterol/atrovent nebs  2. Multiple myeloma: on Salvage Chemo, notified Dr.Mohammed, could benefit from XRT to lytic lesions, unclear if pain originating from lytic lesions or pneumonia Continue IV morphine and vicodin PRN  3.  Vasculitis, thrombosis, continue coumadin per pharmacy  4. 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  Code Status: Full Code.  Family communication: discussed with wife and mother at bedside    Time spent coordinating care:   LOS: 1 day   [facility] Pager: [phone] 12/02/2012, 1:17 PM

## 2012-12-02 NOTE — Evaluation (Signed)
Clinical/Bedside Swallow Evaluation Patient Details  Name: [name] MRN: [id number] Date of Birth: December 18, 1960  Today's Date: 12/02/2012 Time: 1401-1430 SLP Time Calculation (min): 29 min  Past Medical History:  Past Medical History  Diagnosis Date  . Multiple myeloma(203.0) 09/24/2011  . Hypertension    Past Surgical History: History reviewed. No pertinent past surgical history. HPI:  52 yo male adm to [facility] with tachycardia, cough, fever and right rib pain.  Pt diaganosed with bilateral pna, per CT chest was suspicious for aspiration pna.  Order for swallow eval received.  Pt has multiple myeloma (recurrence from 2008) and is undergoing tx currently with immunocompromise.  Recently pt was treated for an URI with zpac and he is also to receive zosyn, levaquin and tamiflu per orders.  Pt denies dysphagia or reflux symptoms but does acknowledge frequent sinus drainage.  Voice is hoarse which his wife attributes to recent coughing from URI.     Assessment / Plan / Recommendation Clinical Impression  Pt presents with functional oropharyngeal from bedside swallow evaluation.  No focal CN deficits nor indications of aspiration with saltine crackers, applesauce and water.  Swallow was timely with clear voice throughout.    Pt's respiratory rate increases to low 30s and respirations appear shallow which may impair airway protection with po.  Educated pt and spouse to aspiration precautions, diet recommendations, heimlich manuever and importance of monitoring respiratory rate with intake to maximize airway protection.    Note pt with h/o stridor and sore throat in 05/2012 but he denies similarity with current symptoms.  Pt also overtly denies s/s of reflux or esophageal deficits.  No further slp indicated as all education completed.  Thanks for the order.     Aspiration Risk  Moderate    Diet Recommendation Regular;Thin liquid   Liquid Administration via: Cup;Straw Medication  Administration: Whole meds with liquid Supervision: Patient able to self feed Compensations: Slow rate;Small sips/bites Postural Changes and/or Swallow Maneuvers: Seated upright 90 degrees;Upright 30-60 min after meal    Other  Recommendations   none  Follow Up Recommendations  None    Frequency and Duration     n/a   Pertinent Vitals/Pain See md note    SLP Swallow Goals   n/a  Swallow Study Prior Functional Status   eats regular diet at home, no dysphagia    General Date of Onset: 12/02/12 HPI: 52 yo male adm to [facility] with tachycardia, cough, fever and right rib pain.  Pt diaganosed with bilateral pna, per CT chest was suspicious for aspiration pna.  Order for swallow eval received.  Pt has multiple myeloma (recurrence from 2008) and is undergoing tx currently with immunocompromise.  Recently pt was treated for an URI with zpac and he is also to receive zosyn, levaquin and tamiflu per orders.  Pt denies dysphagia or reflux symptoms but does acknowledge frequent sinus drainage.  Voice is hoarse which his wife attributes to recent coughing from URI.   Type of Study: Bedside swallow evaluation Previous Swallow Assessment: none, ? if pt on omeprazole at home, DG neck 05/2012 for stridor/sore throat showed prominence of epiglottis and slight prominent hypopharynx Diet Prior to this Study: NPO Temperature Spikes Noted: No Respiratory Status: Room air History of Recent Intubation: No Behavior/Cognition: Alert;Cooperative;Pleasant mood Oral Cavity - Dentition: Adequate natural dentition Self-Feeding Abilities: Able to feed self Patient Positioning: Upright in bed Baseline Vocal Quality: Hoarse Volitional Cough: Strong Volitional Swallow: Able to elicit    Oral/Motor/Sensory Function Overall Oral Motor/Sensory  Function: Appears within functional limits for tasks assessed   Ice Chips Ice chips: Not tested   Thin Liquid Thin Liquid: Within functional limits Presentation: Self  Fed;Straw;Cup    Nectar Thick Nectar Thick Liquid: Not tested   Honey Thick Honey Thick Liquid: Not tested   Puree Puree: Within functional limits Presentation: Self Fed;Spoon   Solid   GO    Solid: Within functional limits Presentation: Self Fed       Chales Abrahams 12/02/2012,2:55 PM

## 2012-12-02 NOTE — ED Notes (Signed)
RT called for pt's nebs.

## 2012-12-02 NOTE — Progress Notes (Signed)
Brief Pharmacy Note: Oral chemothapy for Multiple Myeloma  Patient on Pomalyst and Cytoxan orally for MM. Admitted for aspiration PNA.  Spoke with Dr. Arbutus Ped, he would prefer holding Pomalyst and Cytoxan while pt is hospitalized.   Medication orders discontinued  Thank you,  Otho Bellows PharmD 12/02/2012 4:08 PM

## 2012-12-02 NOTE — ED Notes (Addendum)
Spoke with admitting Dr. About there being no stepdown beds at this time.  He stated this pt cannot be downgraded to telemetry.  Will continue to wait for bed in [place].  Family and pt made aware of delay and verbalize understanding.

## 2012-12-02 NOTE — Progress Notes (Signed)
Pt received from ED into rm [id number]. Alert and oriented x 4. In no acute distress at this time. CHG bath done. Nasal swab also done. Pt  Resting at this time. No complaints voiced. Vwilliams,rn.

## 2012-12-02 NOTE — ED Notes (Signed)
Report given to Dwana Curd, RN in ICU.

## 2012-12-02 NOTE — Progress Notes (Signed)
Call received from [facility], pharmacist to say that she spoke with Dr. Shirline Frees about patient's chemotherapy and MD did not to continue drugs; therefore drugs were being placed on hold for now. Patient made aware. Pt said "that's ok, he's the boss". Vwilliams,rn.

## 2012-12-02 NOTE — Progress Notes (Addendum)
ANTICOAGULATION CONSULT NOTE - Initial Consult  Pharmacy Consult for warfarin Indication: multiple myeloma; vte prophylaxis   Allergies  Allergen Reactions  . Red Dye Anaphylaxis    Lips swollen  3-4 times their baseline size  . Other Other (See Comments)    Strawberries "anything containing red dye"    Patient Measurements:   Heparin Dosing Weight:   Vital Signs: Temp: 98.6 F (37 C) (01/15 0550) Temp src: Oral (01/15 0550) BP: 115/65 mmHg (01/15 0529) Pulse Rate: 99  (01/15 0529)  Labs:  Basename 12/02/12 0555 12/01/12 1820  HGB 8.2* 9.1*  HCT 23.9* 26.8*  PLT 236 268  APTT -- --  LABPROT 20.7* --  INR 1.85* --  HEPARINUNFRC -- --  CREATININE 0.69 0.85  CKTOTAL -- --  CKMB -- --  TROPONINI -- --    The CrCl is unknown because both a height and weight (above a minimum accepted value) are required for this calculation.   Medical History: Past Medical History  Diagnosis Date  . Multiple myeloma(203.0) 09/24/2011  . Hypertension     Medications:   (Not in a hospital admission)  Assessment:  52yo M with multiple myeloma, admitted with left flank pain and suspected aspiration pneumonia.   Pharmacy asked to manage home Coumadin for vte prophylaxis. Most recent home regimen: 5mg /d(empirically reduced since started a Zpak).   INR below target range but no dose was taken on 1/14. No bleeding reported/documented.  Zosyn/Levaquin can increase INR response.    Goal of Therapy:  INR 2-3   Plan:   Coumadin 7.5mg  today at 10am.  F/u daily INR.  Charolotte Eke, PharmD, pager [phone]. 12/02/2012,8:23 AM.

## 2012-12-02 NOTE — ED Notes (Signed)
RT at bedside.

## 2012-12-02 NOTE — Progress Notes (Signed)
Pt noted with medication box on bedside table. Reports medications in box are his chemo drugs and that he is going to self-medicate since he does not want alteration in his schedule. Refused to have meds sent to pharmacy. MD notified. Said it's ok for pt to keep  pilomyst at bedside. Other drugs ordered from pharmacy. Pt made aware of MD's order. Vwilliams,rn.

## 2012-12-02 NOTE — Telephone Encounter (Signed)
Per pt request, letter written for wife to pick up that pt is currently in the hospital.  Letter left at the front desk.  SLJ

## 2012-12-02 NOTE — Progress Notes (Signed)
ANTIBIOTIC CONSULT NOTE - INITIAL  Pharmacy Consult for zosyn Indication: pneumonia  Allergies  Allergen Reactions  . Red Dye Anaphylaxis    Lips swollen  3-4 times their baseline size  . Other Other (See Comments)    Strawberries "anything containing red dye"    Patient Measurements:   Adjusted Body Weight:   Vital Signs: Temp: 98.4 F (36.9 C) (01/14 2313) Temp src: Oral (01/14 2313) BP: 107/54 mmHg (01/14 2313) Pulse Rate: 102  (01/14 2313) Intake/Output from previous day:   Intake/Output from this shift:    Labs:  [facility] 12/01/12 1820  WBC 8.7  HGB 9.1*  PLT 268  LABCREA --  CREATININE 0.85   The CrCl is unknown because both a height and weight (above a minimum accepted value) are required for this calculation. No results found for this basename: VANCOTROUGH:2,VANCOPEAK:2,VANCORANDOM:2,GENTTROUGH:2,GENTPEAK:2,GENTRANDOM:2,TOBRATROUGH:2,TOBRAPEAK:2,TOBRARND:2,AMIKACINPEAK:2,AMIKACINTROU:2,AMIKACIN:2, in the last 72 hours   Microbiology: No results found for this or any previous visit (from the past 720 hour(s)).  Medical History: Past Medical History  Diagnosis Date  . Multiple myeloma(203.0) 09/24/2011  . Hypertension     Medications:  Anti-infectives     Start     Dose/Rate Route Frequency Ordered Stop   12/02/12 0600   piperacillin-tazobactam (ZOSYN) IVPB 3.375 g        3.375 g 12.5 mL/hr over 240 Minutes Intravenous 3 times per day 12/02/12 0114     12/02/12 0115   piperacillin-tazobactam (ZOSYN) IVPB 3.375 g        3.375 g 100 mL/hr over 30 Minutes Intravenous  Once 12/02/12 0112     12/02/12 0115   oseltamivir (TAMIFLU) capsule 75 mg        75 mg Oral 2 times daily 12/02/12 0113 12/06/12 2159   12/01/12 2330   levofloxacin (LEVAQUIN) IVPB 750 mg        750 mg 100 mL/hr over 90 Minutes Intravenous Every 24 hours 12/01/12 2317     12/01/12 2100   levofloxacin (LEVAQUIN) IVPB 750 mg        750 mg 100 mL/hr over 90 Minutes Intravenous  Once  12/01/12 2050 12/01/12 2250         Assessment: Patient with PNA.    Goal of Therapy:  Zosyn based on renal function   Plan:  Follow up culture results Zosyn 3.375g IV Q8H infused over 4hrs.   Darlina Guys, Jacquenette Shone Crowford 12/02/2012,1:17 AM

## 2012-12-02 NOTE — ED Notes (Signed)
Spoke with pharmacy about pt's oral chemo med.  States pt will need to bring this from home.  Pt needs to know what day he is on his cycle of 21 days on and 7 days off.  Pt stated his wife has left to bring med from home at this time.

## 2012-12-02 NOTE — Progress Notes (Signed)
Advance directives packet given to patient. Vwilliams,rn.

## 2012-12-03 DIAGNOSIS — G8929 Other chronic pain: Secondary | ICD-10-CM

## 2012-12-03 LAB — CBC
HCT: 22 % — ABNORMAL LOW (ref 39.0–52.0)
Hemoglobin: 7.5 g/dL — ABNORMAL LOW (ref 13.0–17.0)
MCH: 32.9 pg (ref 26.0–34.0)
MCV: 96.5 fL (ref 78.0–100.0)
Platelets: 233 10*3/uL (ref 150–400)
RBC: 2.28 MIL/uL — ABNORMAL LOW (ref 4.22–5.81)

## 2012-12-03 LAB — BASIC METABOLIC PANEL
BUN: 15 mg/dL (ref 6–23)
CO2: 20 mEq/L (ref 19–32)
Chloride: 99 mEq/L (ref 96–112)
Creatinine, Ser: 0.59 mg/dL (ref 0.50–1.35)
Glucose, Bld: 134 mg/dL — ABNORMAL HIGH (ref 70–99)
Potassium: 3.4 mEq/L — ABNORMAL LOW (ref 3.5–5.1)

## 2012-12-03 MED ORDER — CHLORHEXIDINE GLUCONATE CLOTH 2 % EX PADS
6.0000 | MEDICATED_PAD | Freq: Every day | CUTANEOUS | Status: AC
  Administered 2012-12-03 – 2012-12-07 (×5): 6 via TOPICAL

## 2012-12-03 MED ORDER — WARFARIN SODIUM 5 MG PO TABS
5.0000 mg | ORAL_TABLET | Freq: Once | ORAL | Status: AC
  Administered 2012-12-03: 5 mg via ORAL
  Filled 2012-12-03: qty 1

## 2012-12-03 NOTE — Progress Notes (Signed)
[id number]/Rhonda Davis, RN, BSN, CCM:  CHART REVIEWED AND UPDATED.  Next chart review due on 01192014. NO DISCHARGE NEEDS PRESENT AT THIS TIME. CASE MANAGEMENT [phone] 

## 2012-12-03 NOTE — Progress Notes (Addendum)
[facility]             Progress Note   Subjective: Good improvement in R sided chest pain, cough better  Objective: Vital signs in last 24 hours: Temp:  [98.1 F (36.7 C)-99.6 F (37.6 C)] 98.2 F (36.8 C) (01/16 0400) Pulse Rate:  [85-97] 85  (01/16 0540) Resp:  [18-29] 29  (01/16 0540) BP: (116-145)/(55-73) 145/72 mmHg (01/16 0540) SpO2:  [94 %-98 %] 94 % (01/16 0540) Weight:  [102.8 kg (226 lb 10.1 oz)-104.327 kg (230 lb)] 102.8 kg (226 lb 10.1 oz) (01/15 1033) Weight change:     Intake/Output from previous day: 01/15 0701 - 01/16 0700 In: 1112.5 [I.V.:900; IV Piggyback:212.5] Out: 1700 [Urine:1700]     Physical Exam: General: Alert, awake, oriented x3, in no acute distress. HEENT: No bruits, no goiter. Heart: Regular rate and rhythm, without murmurs, rubs, gallops. Lungs: bilateral ronchi. Chest: mild tenderness in R intercostal muscles in mid axillary line Abdomen: Soft, nontender, nondistended, positive bowel sounds. Extremities: No clubbing cyanosis or edema with positive pedal pulses. Neuro: Grossly intact, nonfocal.    Lab Results: Basic Metabolic Panel:  Basename 12/03/12 0334 12/02/12 0555  NA 132* 126*  K 3.4* 3.7  CL 99 94*  CO2 20 21  GLUCOSE 134* 125*  BUN 15 16  CREATININE 0.59 0.69  CALCIUM 8.9 9.1  MG -- --  PHOS -- --   Liver Function Tests:  [facility] 12/02/12 0555 12/01/12 1820  AST 14 12  ALT 13 15  ALKPHOS 30* 32*  BILITOT 1.4* 1.1  PROT 9.8* 10.5*  ALBUMIN 2.3* 2.6*   No results found for this basename: LIPASE:2,AMYLASE:2 in the last 72 hours No results found for this basename: AMMONIA:2 in the last 72 hours CBC:  Basename 12/03/12 0334 12/02/12 0555 12/01/12 1820  WBC 8.5 6.4 --  NEUTROABS -- -- 7.3  HGB 7.5* 8.2* --  HCT 22.0* 23.9* --  MCV 96.5 96.8 --  PLT 233 236 --   Cardiac Enzymes: No results found for this basename: CKTOTAL:3,CKMB:3,CKMBINDEX:3,TROPONINI:3 in the last 72 hours BNP: No  results found for this basename: PROBNP:3 in the last 72 hours D-Dimer: No results found for this basename: DDIMER:2 in the last 72 hours CBG: No results found for this basename: GLUCAP:6 in the last 72 hours Hemoglobin A1C: No results found for this basename: HGBA1C in the last 72 hours Fasting Lipid Panel: No results found for this basename: CHOL,HDL,LDLCALC,TRIG,CHOLHDL,LDLDIRECT in the last 72 hours Thyroid Function Tests: No results found for this basename: TSH,T4TOTAL,FREET4,T3FREE,THYROIDAB in the last 72 hours Anemia Panel: No results found for this basename: VITAMINB12,FOLATE,FERRITIN,TIBC,IRON,RETICCTPCT in the last 72 hours Coagulation:  Basename 12/03/12 0334 12/02/12 0555  LABPROT 24.5* 20.7*  INR 2.33* 1.85*   Urine Drug Screen: Drugs of Abuse  No results found for this basename: labopia,  cocainscrnur,  labbenz,  amphetmu,  thcu,  labbarb    Alcohol Level: No results found for this basename: ETH:2 in the last 72 hours Urinalysis:  Basename 12/01/12 1629  COLORURINE AMBER*  LABSPEC 1.026  PHURINE 5.5  GLUCOSEU NEGATIVE  HGBUR NEGATIVE  BILIRUBINUR NEGATIVE  KETONESUR NEGATIVE  PROTEINUR 100*  UROBILINOGEN 1.0  NITRITE NEGATIVE  LEUKOCYTESUR NEGATIVE    Recent Results (from the past 240 hour(s))  MRSA PCR SCREENING     Status: Abnormal   Collection Time   12/02/12 10:55 AM      Component Value Range Status Comment   MRSA by PCR POSITIVE (*) NEGATIVE Final  Studies/Results: Dg Ribs Unilateral W/chest Right  12/01/2012  *RADIOLOGY REPORT*  Clinical Data: Pain in the right lower posterior rib area.  No history of injury but the patient has been coughing.  RIGHT RIBS AND CHEST - 3+ VIEW  Comparison: 09/04/2012.  Findings: There is elevation of the right hemidiaphragm.  There is patchy infiltrative density and atelectasis in the right medial base.  No consolidation, pleural effusion, or pneumothorax is evident.  There is some central peribronchial  thickening.  There is mild vascular congestion pattern.  Cardiac silhouette is upper normal size.  There is minimal degenerative spondylosis.  No rib fracture or rib lesion is evident.  IMPRESSION: Elevation of the right hemidiaphragm with patchy infiltrative density and atelectasis in the medial right base. This may reflect a small area of pneumonia but no consolidation or pleural effusion is evident.  Central peribronchial thickening is present.  This may be associated with bronchitis, peribronchial pneumonitis, asthma, and reactive airway disease. There is mild upper lobe vessel prominence vascular congestion pattern.   Original Report Authenticated By: Onalee Hua Call    Ct Angio Chest W/cm &/or Wo Cm  12/01/2012  *RADIOLOGY REPORT*  Clinical Data: Right chest pain, multiple myeloma.  CT ANGIOGRAPHY CHEST  Technique:  Multidetector CT imaging of the chest using the standard protocol during bolus administration of intravenous contrast. Multiplanar reconstructed images including MIPs were obtained and reviewed to evaluate the vascular anatomy.  Contrast: OMNIPAQUE IOHEXOL 350 MG/ML SOLN  Comparison: None.  Findings: There is moderately good contrast opacification of pulmonary artery branches with no convincing filling defects to suggest acute PE. Adequate contrast opacification of the thoracic aorta with no evidence of dissection, aneurysm, or stenosis. There is classic 3-vessel brachiocephalic arch anatomy.  No hilar or mediastinal adenopathy.  Small right pleural effusion.  No pericardial effusion.  Patchy alveolar opacities in the posterior left upper lobe and superior segment left lower lobe.  More confluent patchy airspace consolidation in the posterior, medial, and lateral basal segments right lower lobe.  Diffuse fatty infiltration of the liver.  Remainder visualized upper abdomen unremarkable.  Multiple lytic lesions in the sternum.  There is a lucent lesion in the   T3 vertebral body with mild  superior endplate compression fracture deformity, age indeterminate.  IMPRESSION:  1.  Negative for acute PE or thoracic aortic dissection. 2.  Patchy bilateral airspace disease as above, suggesting aspiration pneumonia. 3. Small right pleural effusion. 4.  Lytic lesions in the sternum and T3 vertebral body. 5.  Fatty liver   Original Report Authenticated By: D. Andria Rhein, MD     Medications: Scheduled Meds:    . Chlorhexidine Gluconate Cloth  6 each Topical Q0600  . dexamethasone  40 mg Oral Weekly  . gabapentin  600 mg Oral TID  . hydrochlorothiazide  12.5 mg Oral Daily  . irbesartan  300 mg Oral Daily  . levofloxacin (LEVAQUIN) IV  750 mg Intravenous Q24H  . mupirocin ointment  1 application Nasal BID  . piperacillin-tazobactam (ZOSYN)  IV  3.375 g Intravenous Q8H  . Warfarin - Pharmacist Dosing Inpatient   Does not apply q1800   Continuous Infusions:    . sodium chloride 100 mL/hr at 12/02/12 2115   PRN Meds:.acetaminophen, albuterol, HYDROcodone-acetaminophen, ipratropium, morphine injection  Assessment/Plan:  1. Aspiration pneumonia Continue Zosyn and levaquin FU cultures, influenza PCR negative Swallow eval ok Albuterol/atrovent nebs  2. Multiple myeloma: on Salvage Chemo, notified Dr.Mohammed,  Chemo on hold  3. R sided chest pain:/  intercostal,  musculoskeletal from muscle strain from coughing improving CTA negative for PE, and no lytic lesions in the location of his pain Continue IV morphine and vicodin PRN  4.  Vasculitis, thrombosis, continue coumadin per pharmacy  5. 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 Anemia: secondary to myeloma, chemo and worsened by acute illness, no overt blood loss,  if trends down further will transfuse  Prophylaxis: on coumadin   Disposition: Inpatient.  Code Status: Full Code.  Family communication: discussed with patient at bedside  Tx to  med-surg bed    Time spent coordinating care:   LOS: 2 days   [facility] Pager: [phone] 12/03/2012, 7:41 AM

## 2012-12-03 NOTE — Progress Notes (Signed)
[name] 1233 TRANSFERRED TO 1337- IN [name]. NO CHANGE IN CONDITION. REPORT WAS GIVEN TO SUE-ELLEN RN.

## 2012-12-03 NOTE — Progress Notes (Signed)
ANTICOAGULATION CONSULT NOTE - Follow Up Consult  Pharmacy Consult for Coumadin Indication: Multiple myeloma, vasculitis/thrombosis, VTE px  Allergies  Allergen Reactions  . Red Dye Anaphylaxis    Lips swollen  3-4 times their baseline size  . Other Other (See Comments)    Strawberries "anything containing red dye"    Patient Measurements: Height: 5\' 3"  (160 cm) Weight: 226 lb 10.1 oz (102.8 kg) IBW/kg (Calculated) : 56.9  Heparin Dosing Weight:   Vital Signs: Temp: 97.8 F (36.6 C) (01/16 0800) Temp src: Oral (01/16 0800) BP: 139/68 mmHg (01/16 0800) Pulse Rate: 84  (01/16 0800)  Labs:  Basename 12/03/12 0334 12/02/12 0555 12/01/12 1820  HGB 7.5* 8.2* --  HCT 22.0* 23.9* 26.8*  PLT 233 236 268  APTT -- -- --  LABPROT 24.5* 20.7* --  INR 2.33* 1.85* --  HEPARINUNFRC -- -- --  CREATININE 0.59 0.69 0.85  CKTOTAL -- -- --  CKMB -- -- --  TROPONINI -- -- --    Estimated Creatinine Clearance: 117.7 ml/min (by C-G formula based on Cr of 0.59).   Medications:  Coumadin PTA 5mg  daily except 7.5mg  MWF. Dose reduced 1/6 empirically to 5mg  daily d/t new Rx for Zpak   Assessment: 51 yoM with multiple myeloma on Coumadin PTA, now resumed.  INR currently therapeutic.  Hgb trending down - noted MD plans for transfusion if it continues to fall. No bleeding or issues per RN.    Goal of Therapy:  INR 2-3 Monitor platelets by anticoagulation protocol: Yes   Plan:  1.  Coumadin 5mg  po x 1 tonight.   2.  F/u daily PT/INR  Rael Tilly E 12/03/2012,11:12 AM

## 2012-12-04 LAB — BASIC METABOLIC PANEL
BUN: 17 mg/dL (ref 6–23)
CO2: 24 mEq/L (ref 19–32)
Chloride: 101 mEq/L (ref 96–112)
Creatinine, Ser: 0.64 mg/dL (ref 0.50–1.35)
Glucose, Bld: 118 mg/dL — ABNORMAL HIGH (ref 70–99)

## 2012-12-04 LAB — CBC
HCT: 25.1 % — ABNORMAL LOW (ref 39.0–52.0)
Hemoglobin: 8.5 g/dL — ABNORMAL LOW (ref 13.0–17.0)
MCV: 98.4 fL (ref 78.0–100.0)
Platelets: 265 10*3/uL (ref 150–400)
RBC: 2.55 MIL/uL — ABNORMAL LOW (ref 4.22–5.81)
WBC: 9.1 10*3/uL (ref 4.0–10.5)

## 2012-12-04 MED ORDER — HYDROCOD POLST-CHLORPHEN POLST 10-8 MG/5ML PO LQCR
5.0000 mL | Freq: Two times a day (BID) | ORAL | Status: DC | PRN
  Administered 2012-12-04 – 2012-12-09 (×6): 5 mL via ORAL
  Filled 2012-12-04 (×7): qty 5

## 2012-12-04 MED ORDER — WARFARIN SODIUM 5 MG PO TABS
5.0000 mg | ORAL_TABLET | Freq: Once | ORAL | Status: AC
  Administered 2012-12-04: 5 mg via ORAL
  Filled 2012-12-04: qty 1

## 2012-12-04 MED ORDER — GUAIFENESIN-DM 100-10 MG/5ML PO SYRP: 5.0000 mL | ORAL_SOLUTION | ORAL | Status: DC | PRN

## 2012-12-04 MED ORDER — LEVOFLOXACIN 500 MG PO TABS
500.0000 mg | ORAL_TABLET | Freq: Every day | ORAL | Status: DC
  Administered 2012-12-04 – 2012-12-09 (×6): 500 mg via ORAL
  Filled 2012-12-04 (×6): qty 1

## 2012-12-04 MED ORDER — BENZONATATE 100 MG PO CAPS
200.0000 mg | ORAL_CAPSULE | Freq: Three times a day (TID) | ORAL | Status: DC
  Administered 2012-12-04 – 2012-12-20 (×46): 200 mg via ORAL
  Filled 2012-12-04 (×57): qty 2

## 2012-12-04 NOTE — Progress Notes (Signed)
ANTICOAGULATION CONSULT NOTE - Follow Up  Pharmacy Consult for Coumadin Indication: Multiple myeloma, vasculitis/thrombosis, VTE px  Allergies  Allergen Reactions  . Red Dye Anaphylaxis    Lips swollen  3-4 times their baseline size  . Other Other (See Comments)    Strawberries "anything containing red dye"   Patient Measurements: Height: 5\' 3"  (160 cm) Weight: 226 lb 10.1 oz (102.8 kg) IBW/kg (Calculated) : 56.9   Vital Signs: Temp: 99.1 F (37.3 C) (01/17 0640) Temp src: Oral (01/17 0640) BP: 136/74 mmHg (01/17 0640) Pulse Rate: 101  (01/17 0640)  Labs:  Basename 12/04/12 0400 12/03/12 0334 12/02/12 0555  HGB 8.5* 7.5* --  HCT 25.1* 22.0* 23.9*  PLT 265 233 236  APTT -- -- --  LABPROT 26.2* 24.5* 20.7*  INR 2.55* 2.33* 1.85*  HEPARINUNFRC -- -- --  CREATININE 0.64 0.59 0.69  CKTOTAL -- -- --  CKMB -- -- --  TROPONINI -- -- --   Estimated Creatinine Clearance: 117.7 ml/min (by C-G formula based on Cr of 0.64).  Medications:  Coumadin PTA 5mg  daily except 7.5mg  MWF. Dose reduced 1/6 empirically to 5mg  daily d/t new Rx for Zpak   Assessment:  51 yoM with multiple myeloma on Coumadin PTA, now resumed.  INR currently therapeutic but rising.  Levaquin can increase the INR response.  Hgb better. No bleeding reported/documented.  Goal of Therapy:  INR 2-3 Monitor platelets by anticoagulation protocol: Yes   Plan:   Repeat Coumadin 5mg  tonight for consistency but may require less.    F/u daily PT/INR  Charolotte Eke, PharmD, pager [phone]. 12/04/2012,12:30 PM.

## 2012-12-04 NOTE — Progress Notes (Signed)
[facility]             Progress Note   Subjective: Pain in the R lower chest intercostal space with coughing  Objective: Vital signs in last 24 hours: Temp:  [97.5 F (36.4 C)-99.1 F (37.3 C)] 99.1 F (37.3 C) (01/17 0640) Pulse Rate:  [79-104] 101  (01/17 0640) Resp:  [20-33] 20  (01/17 0640) BP: (124-139)/(65-74) 136/74 mmHg (01/17 0640) SpO2:  [92 %-97 %] 96 % (01/17 0640) Weight change:  Last BM Date: 12/03/12  Intake/Output from previous day: 01/16 0701 - 01/17 0700 In: 870 [P.O.:600; I.V.:220; IV Piggyback:50] Out: 700 [Urine:700]     Physical Exam: General: Alert, awake, oriented x3, in no acute distress. HEENT: No bruits, no goiter. Heart: Regular rate and rhythm, without murmurs, rubs, gallops. Lungs: bilateral ronchi. Chest: mild tenderness in R intercostal muscles in mid axillary line Abdomen: Soft, nontender, nondistended, positive bowel sounds. Extremities: No clubbing cyanosis or edema with positive pedal pulses. Neuro: Grossly intact, nonfocal.    Lab Results: Basic Metabolic Panel:  Basename 12/04/12 0400 12/03/12 0334  NA 133* 132*  K 3.4* 3.4*  CL 101 99  CO2 24 20  GLUCOSE 118* 134*  BUN 17 15  CREATININE 0.64 0.59  CALCIUM 9.0 8.9  MG -- --  PHOS -- --   Liver Function Tests:  [facility] 12/02/12 0555 12/01/12 1820  AST 14 12  ALT 13 15  ALKPHOS 30* 32*  BILITOT 1.4* 1.1  PROT 9.8* 10.5*  ALBUMIN 2.3* 2.6*   No results found for this basename: LIPASE:2,AMYLASE:2 in the last 72 hours No results found for this basename: AMMONIA:2 in the last 72 hours CBC:  Basename 12/04/12 0400 12/03/12 0334 12/01/12 1820  WBC 9.1 8.5 --  NEUTROABS -- -- 7.3  HGB 8.5* 7.5* --  HCT 25.1* 22.0* --  MCV 98.4 96.5 --  PLT 265 233 --   Cardiac Enzymes: No results found for this basename: CKTOTAL:3,CKMB:3,CKMBINDEX:3,TROPONINI:3 in the last 72 hours BNP: No results found for this basename: PROBNP:3 in the last 72  hours D-Dimer: No results found for this basename: DDIMER:2 in the last 72 hours CBG: No results found for this basename: GLUCAP:6 in the last 72 hours Hemoglobin A1C: No results found for this basename: HGBA1C in the last 72 hours Fasting Lipid Panel: No results found for this basename: CHOL,HDL,LDLCALC,TRIG,CHOLHDL,LDLDIRECT in the last 72 hours Thyroid Function Tests: No results found for this basename: TSH,T4TOTAL,FREET4,T3FREE,THYROIDAB in the last 72 hours Anemia Panel: No results found for this basename: VITAMINB12,FOLATE,FERRITIN,TIBC,IRON,RETICCTPCT in the last 72 hours Coagulation:  Basename 12/04/12 0400 12/03/12 0334  LABPROT 26.2* 24.5*  INR 2.55* 2.33*   Urine Drug Screen: Drugs of Abuse  No results found for this basename: labopia,  cocainscrnur,  labbenz,  amphetmu,  thcu,  labbarb    Alcohol Level: No results found for this basename: ETH:2 in the last 72 hours Urinalysis:  Basename 12/01/12 1629  COLORURINE AMBER*  LABSPEC 1.026  PHURINE 5.5  GLUCOSEU NEGATIVE  HGBUR NEGATIVE  BILIRUBINUR NEGATIVE  KETONESUR NEGATIVE  PROTEINUR 100*  UROBILINOGEN 1.0  NITRITE NEGATIVE  LEUKOCYTESUR NEGATIVE    Recent Results (from the past 240 hour(s))  CULTURE, BLOOD (ROUTINE X 2)     Status: Normal (Preliminary result)   Collection Time   12/01/12  8:55 PM      Component Value Range Status Comment   Specimen Description BLOOD LEFT HAND   Final    Special Requests BOTTLES DRAWN AEROBIC AND ANAEROBIC  5CC   Final    Culture  Setup Time 12/02/2012 02:22   Final    Culture     Final    Value:        BLOOD CULTURE RECEIVED NO GROWTH TO DATE CULTURE WILL BE HELD FOR 5 DAYS BEFORE ISSUING A FINAL NEGATIVE REPORT   Report Status PENDING   Incomplete   CULTURE, BLOOD (ROUTINE X 2)     Status: Normal (Preliminary result)   Collection Time   12/01/12  9:02 PM      Component Value Range Status Comment   Specimen Description BLOOD RIGHT HAND   Final    Special Requests  BOTTLES DRAWN AEROBIC AND ANAEROBIC 5CC   Final    Culture  Setup Time 12/02/2012 02:22   Final    Culture     Final    Value:        BLOOD CULTURE RECEIVED NO GROWTH TO DATE CULTURE WILL BE HELD FOR 5 DAYS BEFORE ISSUING A FINAL NEGATIVE REPORT   Report Status PENDING   Incomplete   MRSA PCR SCREENING     Status: Abnormal   Collection Time   12/02/12 10:55 AM      Component Value Range Status Comment   MRSA by PCR POSITIVE (*) NEGATIVE Final     Studies/Results: No results found.  Medications: Scheduled Meds:    . benzonatate  200 mg Oral TID  . Chlorhexidine Gluconate Cloth  6 each Topical Q0600  . dexamethasone  40 mg Oral Weekly  . gabapentin  600 mg Oral TID  . hydrochlorothiazide  12.5 mg Oral Daily  . irbesartan  300 mg Oral Daily  . levofloxacin  500 mg Oral Daily  . mupirocin ointment  1 application Nasal BID  . Warfarin - Pharmacist Dosing Inpatient   Does not apply q1800   Continuous Infusions:    . sodium chloride 1,000 mL (12/03/12 0837)   PRN Meds:.acetaminophen, albuterol, guaiFENesin-dextromethorphan, HYDROcodone-acetaminophen, ipratropium, morphine injection  Assessment/Plan:  1. Health care associated pneumonia DC Zosyn and change levaquin to PO FU cultures, influenza PCR negative Swallow eval ok Albuterol/atrovent nebs Add robitussin and benzonatate for cough  2. Multiple myeloma: on Salvage Chemo, notified Dr.Mohammed,  Chemo on hold  3. R sided chest pain:/ intercostal,  musculoskeletal from muscle strain from coughing improving CTA negative for PE, and no lytic lesions in the location of his pain Continue IV morphine and vicodin PRN  4.  Vasculitis, thrombosis, continue coumadin per pharmacy  5. 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 Anemia: secondary to myeloma, chemo and worsened by acute illness, no overt blood loss,  Stable now  Prophylaxis: on coumadin    Disposition: home ? tomorrow Code Status: Full Code.  Family communication: discussed with patient at bedside      Time spent coordinating care:   LOS: 3 days   [facility] Pager: [phone] 12/04/2012, 7:35 AM

## 2012-12-05 ENCOUNTER — Inpatient Hospital Stay (HOSPITAL_COMMUNITY): Payer: 59

## 2012-12-05 DIAGNOSIS — J90 Pleural effusion, not elsewhere classified: Secondary | ICD-10-CM

## 2012-12-05 DIAGNOSIS — C9000 Multiple myeloma not having achieved remission: Secondary | ICD-10-CM

## 2012-12-05 DIAGNOSIS — G589 Mononeuropathy, unspecified: Secondary | ICD-10-CM

## 2012-12-05 DIAGNOSIS — J189 Pneumonia, unspecified organism: Secondary | ICD-10-CM

## 2012-12-05 LAB — PROTIME-INR
INR: 2.18 — ABNORMAL HIGH (ref 0.00–1.49)
Prothrombin Time: 26.9 seconds — ABNORMAL HIGH (ref 11.6–15.2)
Prothrombin Time: 23.3 seconds — ABNORMAL HIGH (ref 11.6–15.2)

## 2012-12-05 LAB — EXPECTORATED SPUTUM ASSESSMENT W GRAM STAIN, RFLX TO RESP C

## 2012-12-05 MED ORDER — DEXTROSE 5 % IV SOLN
2.0000 g | Freq: Three times a day (TID) | INTRAVENOUS | Status: DC
  Administered 2012-12-05 – 2012-12-10 (×15): 2 g via INTRAVENOUS
  Filled 2012-12-05 (×22): qty 2

## 2012-12-05 MED ORDER — MORPHINE SULFATE 4 MG/ML IJ SOLN
4.0000 mg | INTRAMUSCULAR | Status: DC | PRN
  Administered 2012-12-05 – 2012-12-10 (×21): 4 mg via INTRAVENOUS
  Filled 2012-12-05 (×22): qty 1

## 2012-12-05 MED ORDER — VANCOMYCIN HCL 10 G IV SOLR
2000.0000 mg | Freq: Once | INTRAVENOUS | Status: AC
  Administered 2012-12-05: 2000 mg via INTRAVENOUS
  Filled 2012-12-05: qty 2000

## 2012-12-05 MED ORDER — FUROSEMIDE 10 MG/ML IJ SOLN
40.0000 mg | Freq: Once | INTRAMUSCULAR | Status: AC
  Administered 2012-12-05: 40 mg via INTRAVENOUS
  Filled 2012-12-05: qty 4

## 2012-12-05 MED ORDER — HYDROCODONE-ACETAMINOPHEN 10-325 MG PO TABS
2.0000 | ORAL_TABLET | ORAL | Status: DC | PRN
  Administered 2012-12-05 – 2012-12-08 (×3): 2 via ORAL
  Administered 2012-12-08: 1 via ORAL
  Administered 2012-12-08 – 2012-12-11 (×11): 2 via ORAL
  Filled 2012-12-05 (×2): qty 2
  Filled 2012-12-05: qty 1
  Filled 2012-12-05 (×13): qty 2

## 2012-12-05 MED ORDER — GUAIFENESIN 100 MG/5ML PO SYRP
200.0000 mg | ORAL_SOLUTION | ORAL | Status: DC | PRN
  Filled 2012-12-05 (×2): qty 118

## 2012-12-05 MED ORDER — VANCOMYCIN HCL IN DEXTROSE 1-5 GM/200ML-% IV SOLN
1000.0000 mg | Freq: Two times a day (BID) | INTRAVENOUS | Status: DC
Start: 2012-12-05 — End: 2012-12-08
  Administered 2012-12-06 – 2012-12-08 (×5): 1000 mg via INTRAVENOUS
  Filled 2012-12-05 (×6): qty 200

## 2012-12-05 MED ORDER — LEVALBUTEROL HCL 0.63 MG/3ML IN NEBU
0.6300 mg | INHALATION_SOLUTION | Freq: Four times a day (QID) | RESPIRATORY_TRACT | Status: DC
  Administered 2012-12-05 – 2012-12-10 (×21): 0.63 mg via RESPIRATORY_TRACT
  Filled 2012-12-05 (×32): qty 3

## 2012-12-05 NOTE — Progress Notes (Addendum)
[facility]             Progress Note   Subjective: Pain in the R lower chest intercostal space with coughing, still coughing a lot, appears more dyspneic today  Objective: Vital signs in last 24 hours: Temp:  [98.7 F (37.1 C)-99.4 F (37.4 C)] 99.4 F (37.4 C) (01/17 2135) Pulse Rate:  [106-120] 117  (01/18 0547) Resp:  [20-24] 20  (01/18 0547) BP: (125-138)/(58-92) 125/58 mmHg (01/18 0547) SpO2:  [90 %-95 %] 93 % (01/18 0919) Weight change:  Last BM Date: 12/03/12  Intake/Output from previous day: 01/17 0701 - 01/18 0700 In: 380 [P.O.:380] Out: -      Physical Exam: General: Alert, awake, oriented x3, in mild acute distress. HEENT: No bruits, no goiter. Heart: Regular rate and rhythm, without murmurs, rubs, gallops. Lungs: bilateral ronchi, decreased BS at R base. Chest: mild tenderness in R intercostal muscles in mid axillary line Abdomen: Soft, nontender, nondistended, positive bowel sounds. Extremities: No clubbing cyanosis or edema with positive pedal pulses. Neuro: Grossly intact, nonfocal.    Lab Results: Basic Metabolic Panel:  Basename 12/04/12 0400 12/03/12 0334  NA 133* 132*  K 3.4* 3.4*  CL 101 99  CO2 24 20  GLUCOSE 118* 134*  BUN 17 15  CREATININE 0.64 0.59  CALCIUM 9.0 8.9  MG -- --  PHOS -- --   Liver Function Tests: No results found for this basename: AST:2,ALT:2,ALKPHOS:2,BILITOT:2,PROT:2,ALBUMIN:2 in the last 72 hours No results found for this basename: LIPASE:2,AMYLASE:2 in the last 72 hours No results found for this basename: AMMONIA:2 in the last 72 hours CBC:  Basename 12/04/12 0400 12/03/12 0334  WBC 9.1 8.5  NEUTROABS -- --  HGB 8.5* 7.5*  HCT 25.1* 22.0*  MCV 98.4 96.5  PLT 265 233   Cardiac Enzymes: No results found for this basename: CKTOTAL:3,CKMB:3,CKMBINDEX:3,TROPONINI:3 in the last 72 hours BNP: No results found for this basename: PROBNP:3 in the last 72 hours D-Dimer: No results found for this  basename: DDIMER:2 in the last 72 hours CBG: No results found for this basename: GLUCAP:6 in the last 72 hours Hemoglobin A1C: No results found for this basename: HGBA1C in the last 72 hours Fasting Lipid Panel: No results found for this basename: CHOL,HDL,LDLCALC,TRIG,CHOLHDL,LDLDIRECT in the last 72 hours Thyroid Function Tests: No results found for this basename: TSH,T4TOTAL,FREET4,T3FREE,THYROIDAB in the last 72 hours Anemia Panel: No results found for this basename: VITAMINB12,FOLATE,FERRITIN,TIBC,IRON,RETICCTPCT in the last 72 hours Coagulation:  Basename 12/05/12 0402 12/04/12 0400  LABPROT 26.9* 26.2*  INR 2.64* 2.55*   Urine Drug Screen: Drugs of Abuse  No results found for this basename: labopia,  cocainscrnur,  labbenz,  amphetmu,  thcu,  labbarb    Alcohol Level: No results found for this basename: ETH:2 in the last 72 hours Urinalysis: No results found for this basename: COLORURINE:2,APPERANCEUR:2,LABSPEC:2,PHURINE:2,GLUCOSEU:2,HGBUR:2,BILIRUBINUR:2,KETONESUR:2,PROTEINUR:2,UROBILINOGEN:2,NITRITE:2,LEUKOCYTESUR:2 in the last 72 hours  Recent Results (from the past 240 hour(s))  CULTURE, BLOOD (ROUTINE X 2)     Status: Normal (Preliminary result)   Collection Time   12/01/12  8:55 PM      Component Value Range Status Comment   Specimen Description BLOOD LEFT HAND   Final    Special Requests BOTTLES DRAWN AEROBIC AND ANAEROBIC 5CC   Final    Culture  Setup Time 12/02/2012 02:22   Final    Culture     Final    Value:        BLOOD CULTURE RECEIVED NO GROWTH TO DATE CULTURE WILL  BE HELD FOR 5 DAYS BEFORE ISSUING A FINAL NEGATIVE REPORT   Report Status PENDING   Incomplete   CULTURE, BLOOD (ROUTINE X 2)     Status: Normal (Preliminary result)   Collection Time   12/01/12  9:02 PM      Component Value Range Status Comment   Specimen Description BLOOD RIGHT HAND   Final    Special Requests BOTTLES DRAWN AEROBIC AND ANAEROBIC 5CC   Final    Culture  Setup Time 12/02/2012  02:22   Final    Culture     Final    Value:        BLOOD CULTURE RECEIVED NO GROWTH TO DATE CULTURE WILL BE HELD FOR 5 DAYS BEFORE ISSUING A FINAL NEGATIVE REPORT   Report Status PENDING   Incomplete   MRSA PCR SCREENING     Status: Abnormal   Collection Time   12/02/12 10:55 AM      Component Value Range Status Comment   MRSA by PCR POSITIVE (*) NEGATIVE Final     Studies/Results: Dg Chest Port 1 View  12/05/2012  *RADIOLOGY REPORT*  Clinical Data: Shortness of breath, cough, pneumonia  PORTABLE CHEST - 1 VIEW  Comparison: 12/01/2012; 09/04/2012; chest CT - 12/01/2012  Findings:  Grossly unchanged enlarged cardiac silhouette and mediastinal contours.  Interval development of a partially loculated right- sided pleural effusion.  Worsening right mid and lower lung heterogeneous opacities.  No change to minimal increase in left basilar/retrocardiac opacity.  No left-sided pleural effusion.  No pneumothorax.  Unchanged bones.  IMPRESSION: Interval development of partially loculated parapneumonic right- sided pleural effusion worrisome for progression of previously identified multifocal infection, right greater than left, as demonstrated on recent chest CT.  A follow-up chest radiograph in 4 to 6 weeks after treatment is recommended to ensure resolution.   Original Report Authenticated By: Tacey Ruiz, MD     Medications: Scheduled Meds:    . benzonatate  200 mg Oral TID  . Chlorhexidine Gluconate Cloth  6 each Topical Q0600  . dexamethasone  40 mg Oral Weekly  . gabapentin  600 mg Oral TID  . irbesartan  300 mg Oral Daily  . levalbuterol  0.63 mg Nebulization Q6H  . levofloxacin  500 mg Oral Daily  . mupirocin ointment  1 application Nasal BID  . Warfarin - Pharmacist Dosing Inpatient   Does not apply q1800   Continuous Infusions:    . sodium chloride 1,000 mL (12/03/12 0837)   PRN Meds:.acetaminophen, albuterol, chlorpheniramine-HYDROcodone, guaifenesin, HYDROcodone-acetaminophen,  ipratropium, morphine injection  Assessment/Plan:  1. Health care associated pneumonia Zosyn stopped 1/16 and continued on levaquin Blood Cx negative, influenza PCR negative Swallow eval ok Albuterol/atrovent nebs Dyspnea worse today will check STAT portable CXR  2. Multiple myeloma: on Salvage Chemo, notified Dr.Mohammed,  Chemo on hold  3. R sided chest pain:/ intercostal, musculoskeletal from muscle strain from coughing improving CTA negative for PE, and no lytic lesions in the location of his pain Continue IV morphine and vicodin PRN  4.  Vasculitis/ thrombosis, continue coumadin per pharmacy  5. 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 Anemia: secondary to myeloma, chemo and worsened by acute illness, no overt blood loss,  Stable now  Prophylaxis: on coumadin  Disposition: home when improved Code Status: Full Code.  Family communication: discussed with patient and wife at bedside  Time spent coordinating care:  LOS: 4 days   [facility] Pager: [phone] 12/05/2012,  9:26 AM  Addendum: CXR with R parapneumonic effusion, worsening of airspace disease: Requested Pulm consult per Dr.CLance Will broaden antibiotics again, IR consulted for therapeutic and diagnostic thoracentesis, hold coumadin FFP -2 units now Zannie Cove, MD

## 2012-12-05 NOTE — Progress Notes (Signed)
ANTIBIOTIC CONSULT NOTE - INITIAL  Pharmacy Consult for Vancomycin and Cefepime Indication: Pneumonia, aspiration vs. HCAP.  Allergies  Allergen Reactions  . Red Dye Anaphylaxis    Lips swollen  3-4 times their baseline size  . Other Other (See Comments)    Strawberries "anything containing red dye"   Patient Measurements: Height: 5\' 3"  (160 cm) Weight: 226 lb 10.1 oz (102.8 kg) IBW/kg (Calculated) : 56.9   Vital Signs: Temp src: Oral (01/18 0547) BP: 125/58 mmHg (01/18 0547) Pulse Rate: 117  (01/18 0547) Intake/Output from previous day: 01/17 0701 - 01/18 0700 In: 380 [P.O.:380] Out: -  Intake/Output from this shift:    Labs:  Basename 12/04/12 0400 12/03/12 0334  WBC 9.1 8.5  HGB 8.5* 7.5*  PLT 265 233  LABCREA -- --  CREATININE 0.64 0.59   Estimated Creatinine Clearance: 117.7 ml/min (by C-G formula based on Cr of 0.64). No results found for this basename: VANCOTROUGH:2,VANCOPEAK:2,VANCORANDOM:2,GENTTROUGH:2,GENTPEAK:2,GENTRANDOM:2,TOBRATROUGH:2,TOBRAPEAK:2,TOBRARND:2,AMIKACINPEAK:2,AMIKACINTROU:2,AMIKACIN:2, in the last 72 hours   Microbiology: Recent Results (from the past 720 hour(s))  CULTURE, BLOOD (ROUTINE X 2)     Status: Normal (Preliminary result)   Collection Time   12/01/12  8:55 PM      Component Value Range Status Comment   Specimen Description BLOOD LEFT HAND   Final    Special Requests BOTTLES DRAWN AEROBIC AND ANAEROBIC 5CC   Final    Culture  Setup Time 12/02/2012 02:22   Final    Culture     Final    Value:        BLOOD CULTURE RECEIVED NO GROWTH TO DATE CULTURE WILL BE HELD FOR 5 DAYS BEFORE ISSUING A FINAL NEGATIVE REPORT   Report Status PENDING   Incomplete   CULTURE, BLOOD (ROUTINE X 2)     Status: Normal (Preliminary result)   Collection Time   12/01/12  9:02 PM      Component Value Range Status Comment   Specimen Description BLOOD RIGHT HAND   Final    Special Requests BOTTLES DRAWN AEROBIC AND ANAEROBIC 5CC   Final    Culture  Setup  Time 12/02/2012 02:22   Final    Culture     Final    Value:        BLOOD CULTURE RECEIVED NO GROWTH TO DATE CULTURE WILL BE HELD FOR 5 DAYS BEFORE ISSUING A FINAL NEGATIVE REPORT   Report Status PENDING   Incomplete   MRSA PCR SCREENING     Status: Abnormal   Collection Time   12/02/12 10:55 AM      Component Value Range Status Comment   MRSA by PCR POSITIVE (*) NEGATIVE Final     Medical History: Past Medical History  Diagnosis Date  . Multiple myeloma(203.0) 09/24/2011  . Hypertension    Medications:  Anti-infectives     Start     Dose/Rate Route Frequency Ordered Stop   12/04/12 1000   levofloxacin (LEVAQUIN) tablet 500 mg        500 mg Oral Daily 12/04/12 0734     12/02/12 0600   piperacillin-tazobactam (ZOSYN) IVPB 3.375 g  Status:  Discontinued        3.375 g 12.5 mL/hr over 240 Minutes Intravenous 3 times per day 12/02/12 0114 12/04/12 0734   12/02/12 0115  piperacillin-tazobactam (ZOSYN) IVPB 3.375 g       3.375 g 100 mL/hr over 30 Minutes Intravenous  Once 12/02/12 0112 12/02/12 0150   12/02/12 0115   oseltamivir (TAMIFLU) capsule 75 mg  Status:  Discontinued        75 mg Oral 2 times daily 12/02/12 0113 12/03/12 0740   12/01/12 2330   levofloxacin (LEVAQUIN) IVPB 750 mg  Status:  Discontinued        750 mg 100 mL/hr over 90 Minutes Intravenous Every 24 hours 12/01/12 2317 12/04/12 0734   12/01/12 2100   levofloxacin (LEVAQUIN) IVPB 750 mg        750 mg 100 mL/hr over 90 Minutes Intravenous  Once 12/01/12 2050 12/01/12 2250         Assessment:  52yo M with multiple myeloma admitted 1/14 with rib pain and pneumonia. Placed on Zosyn and Levaquin for suspected aspiration pna. Zosyn was stopped 1/17.  Continued coughing and more dyspneic. Broadening abx.  Pharmacy asked to dose Vanc and Cefepime.  Good renal function.   Blood cx 1/14 ngtd.  MRSA PCR positive.  Goal of Therapy:  Vancomycin trough level 15-20 mcg/ml  Plan:   Vancomycin 2g IV loading  dose then 1g IV q12h. Cefepime 2g IV q8h. Cont Levaquin 500mg  PO q24h.  Measure Vanc trough at steady state.  Follow up renal fxn and culture results.  Charolotte Eke, PharmD, pager [phone]. 12/05/2012,10:12 AM.

## 2012-12-05 NOTE — Progress Notes (Signed)
Patient's temp increased to 99.9, axillary at the 15-minute checkpoint of the platelet transfusion, 1:30 pm. This was the second unit. Transfusion held, line flushed, MD called, 2 Tylenol given. Transfusion will be held one hour. Merlyn Albert RN [phone]

## 2012-12-05 NOTE — Consult Note (Signed)
Dictation #:  [id number]

## 2012-12-06 ENCOUNTER — Inpatient Hospital Stay (HOSPITAL_COMMUNITY): Payer: 59

## 2012-12-06 LAB — CBC
HCT: 24 % — ABNORMAL LOW (ref 39.0–52.0)
Platelets: 261 10*3/uL (ref 150–400)
RDW: 18.7 % — ABNORMAL HIGH (ref 11.5–15.5)
WBC: 8.6 10*3/uL (ref 4.0–10.5)

## 2012-12-06 LAB — PREPARE FRESH FROZEN PLASMA
Unit division: 0
Unit division: 0

## 2012-12-06 LAB — PROTIME-INR
INR: 2.35 — ABNORMAL HIGH (ref 0.00–1.49)
INR: 2.14 — ABNORMAL HIGH (ref 0.00–1.49)
Prothrombin Time: 24.7 seconds — ABNORMAL HIGH (ref 11.6–15.2)

## 2012-12-06 LAB — BASIC METABOLIC PANEL
Chloride: 96 mEq/L (ref 96–112)
GFR calc Af Amer: >90 mL/min (ref >90)
Potassium: 3.5 mEq/L (ref 3.5–5.1)
Sodium: 128 mEq/L — ABNORMAL LOW (ref 135–145)

## 2012-12-06 MED ORDER — FUROSEMIDE 10 MG/ML IJ SOLN
40.0000 mg | Freq: Once | INTRAMUSCULAR | Status: AC
  Administered 2012-12-07: 40 mg via INTRAVENOUS
  Filled 2012-12-06: qty 4

## 2012-12-06 MED ORDER — IBUPROFEN 600 MG PO TABS
600.0000 mg | ORAL_TABLET | Freq: Once | ORAL | Status: AC
  Administered 2012-12-06: 600 mg via ORAL
  Filled 2012-12-06: qty 1

## 2012-12-06 NOTE — Consult Note (Signed)
NAME:  [name], [name] NO.:  [id number]  MEDICAL RECORD NO.:  [id number]  LOCATION:  1337                         FACILITY:  [facility]  PHYSICIAN:  Barbaraann Share, MD,FCCPDATE OF BIRTH:  Mar 03[birth year] 1962  DATE OF CONSULTATION:  12/05/2012 DATE OF DISCHARGE:                                CONSULTATION   REFERRING PHYSICIAN:  [facility].  HISTORY OF PRESENT ILLNESS:  The patient is a 52 year old male who I have been asked to see for worsening chest x-ray and dyspnea.  The patient is a long history of advanced multiple myeloma, and has had a stem cell transplant and is on chronic immunosuppressive medications. 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  He was in his usual state of health until recently when he began to develop a right-sided pleuritic chest pain.  A chest x-ray was done that showed a new infiltrate in the right base medially.  He was subsequent admitted to the hospital and started on broad-spectrum antibiotics.  The patient has continued to have some pleuritic chest pain, and also coughing up purulent mucus despite being on antibiotics.  He had a followup chest x-ray done today which showed a significant worsening of a right pleural effusion with partial loculation tracking up the right chest wall.  He also appeared to have increased infiltration in the right lung that it is unclear if this is worsening pneumonia versus compressive atelectasis from his pleural effusions.  The patient has had some increased shortness of breath, but has adequate oxygen saturations and no significant increased work of breathing currently.  Sputum culture has not been sent presently.  He has been scheduled for an ultrasound-guided thoracentesis as soon as his INR is an adequate range.  PAST MEDICAL HISTORY: 1. Significant for multiple myeloma. 2. History of uncharacterized vasculitis. 3. History of  hypertension. 4. History of chronic pain syndrome.  ALLERGIES:  The patient is allergic to red dye.  SOCIAL HISTORY:  He is married.  He never smoked or used recreational drugs.  FAMILY HISTORY:  Totally insignificant per history from the patient.  REVIEW OF SYSTEMS:  A 10-point review of systems was unremarkable except for that listed in history of present illness.  PHYSICAL EXAMINATION:  GENERAL:  He is an obese male, in no acute distress. VITAL SIGNS:  Blood pressure is 122/58.  Pulse is 108.  Respiratory rate is 20.  He is afebrile.  2 L sat is 94%. HEENT:  Pupils equal, round, and reactive to light and accommodation. Extraocular muscles are intact.  Nares patent.  No discharge. Oropharynx is clear. NECK:  Supple without JVD or lymphadenopathies.  No palpable thyromegaly. CHEST:  Reveals very diminished breath sounds on the right with few crackles, but surprisingly no E to A changes in the right base.  Left lung was totally clear to auscultation. CARDIAC:  Reveals mild tachycardia, regular rhythm with 2/6 systolic murmur. ABDOMEN:  Soft, nontender, nondistended.  Good bowel sounds. GENITAL:  Not done and not indicated. RECTAL:  Not done and not indicated. BREAST:  Not done and not indicated. EXTREMITIES:  Lower extremities with mild edema, no cyanosis noted. There  is no calf tenderness. NEUROLOGICAL:  He is alert and oriented, and moves all 4 extremities.  IMPRESSION: 1. Right lower lobe pneumonia with worsening right pleural effusion     that appears to be tracking up the right chest wall.  This is most     consistent with pneumonia and probably developing parapneumonic     effusion that is becoming complicated.  Given the patient's     immunosuppression, I think we need to treat him aggressively with     very broad-spectrum antibiotics including MRSA coverage.  Also I     think, we need to do a thoracentesis as soon as possible in order     to evacuate the pleural  space before loculates further.  It is     unclear whether the pleural fluid is infected.  It should be noted     that he did not have any evidence for thromboembolic disease on a     recent CT chest, and his effusion was very small at admission.     Barbaraann Share, MD,FCCP     [facility]  D:  12/05/2012  T:  12/06/2012  Job:  [id number]

## 2012-12-06 NOTE — Progress Notes (Signed)
[facility]             Progress Note   Subjective: Still coughing quite a lot, breathing worse with activity, Pain in the R lower chest intercostal space with coughing  Objective: Vital signs in last 24 hours: Temp:  [97.4 F (36.3 C)-99.8 F (37.7 C)] 99.8 F (37.7 C) (01/19 0625) Pulse Rate:  [103-121] 121  (01/19 0625) Resp:  [16-32] 32  (01/19 0625) BP: (117-150)/(58-74) 129/59 mmHg (01/19 0625) SpO2:  [93 %-96 %] 96 % (01/19 0625) Weight change:  Last BM Date: 12/05/12  Intake/Output from previous day: 01/18 0701 - 01/19 0700 In: 487.5 [Blood:487.5] Out: 400 [Urine:400]     Physical Exam: General: Alert, awake, oriented x3, in mild acute distress. HEENT: No bruits, no goiter. Heart: Regular rate and rhythm, without murmurs, rubs, gallops. Lungs: bilateral ronchi, decreased BS at R base. Chest: mild tenderness in R intercostal muscles in mid axillary line Abdomen: Soft, nontender, nondistended, positive bowel sounds. Extremities: trace edema, No clubbing cyanosis with positive pedal pulses. Neuro: Grossly intact, nonfocal.    Lab Results: Basic Metabolic Panel:  Basename 12/06/12 0413 12/04/12 0400  NA 128* 133*  K 3.5 3.4*  CL 96 101  CO2 22 24  GLUCOSE 89 118*  BUN 19 17  CREATININE 0.64 0.64  CALCIUM 9.0 9.0  MG -- --  PHOS -- --   Liver Function Tests: No results found for this basename: AST:2,ALT:2,ALKPHOS:2,BILITOT:2,PROT:2,ALBUMIN:2 in the last 72 hours No results found for this basename: LIPASE:2,AMYLASE:2 in the last 72 hours No results found for this basename: AMMONIA:2 in the last 72 hours CBC:  Basename 12/06/12 0413 12/04/12 0400  WBC 8.6 9.1  NEUTROABS -- --  HGB 7.9* 8.5*  HCT 24.0* 25.1*  MCV 98.8 98.4  PLT 261 265   Cardiac Enzymes: No results found for this basename: CKTOTAL:3,CKMB:3,CKMBINDEX:3,TROPONINI:3 in the last 72 hours BNP: No results found for this basename: PROBNP:3 in the last 72  hours D-Dimer: No results found for this basename: DDIMER:2 in the last 72 hours CBG: No results found for this basename: GLUCAP:6 in the last 72 hours Hemoglobin A1C: No results found for this basename: HGBA1C in the last 72 hours Fasting Lipid Panel: No results found for this basename: CHOL,HDL,LDLCALC,TRIG,CHOLHDL,LDLDIRECT in the last 72 hours Thyroid Function Tests: No results found for this basename: TSH,T4TOTAL,FREET4,T3FREE,THYROIDAB in the last 72 hours Anemia Panel: No results found for this basename: VITAMINB12,FOLATE,FERRITIN,TIBC,IRON,RETICCTPCT in the last 72 hours Coagulation:  Basename 12/06/12 0413 12/05/12 1825  LABPROT 24.7* 23.3*  INR 2.35* 2.18*   Urine Drug Screen: Drugs of Abuse  No results found for this basename: labopia,  cocainscrnur,  labbenz,  amphetmu,  thcu,  labbarb    Alcohol Level: No results found for this basename: ETH:2 in the last 72 hours Urinalysis: No results found for this basename: COLORURINE:2,APPERANCEUR:2,LABSPEC:2,PHURINE:2,GLUCOSEU:2,HGBUR:2,BILIRUBINUR:2,KETONESUR:2,PROTEINUR:2,UROBILINOGEN:2,NITRITE:2,LEUKOCYTESUR:2 in the last 72 hours  Recent Results (from the past 240 hour(s))  CULTURE, BLOOD (ROUTINE X 2)     Status: Normal (Preliminary result)   Collection Time   12/01/12  8:55 PM      Component Value Range Status Comment   Specimen Description BLOOD LEFT HAND   Final    Special Requests BOTTLES DRAWN AEROBIC AND ANAEROBIC 5CC   Final    Culture  Setup Time 12/02/2012 02:22   Final    Culture     Final    Value:        BLOOD CULTURE RECEIVED NO GROWTH TO DATE CULTURE  WILL BE HELD FOR 5 DAYS BEFORE ISSUING A FINAL NEGATIVE REPORT   Report Status PENDING   Incomplete   CULTURE, BLOOD (ROUTINE X 2)     Status: Normal (Preliminary result)   Collection Time   12/01/12  9:02 PM      Component Value Range Status Comment   Specimen Description BLOOD RIGHT HAND   Final    Special Requests BOTTLES DRAWN AEROBIC AND ANAEROBIC 5CC    Final    Culture  Setup Time 12/02/2012 02:22   Final    Culture     Final    Value:        BLOOD CULTURE RECEIVED NO GROWTH TO DATE CULTURE WILL BE HELD FOR 5 DAYS BEFORE ISSUING A FINAL NEGATIVE REPORT   Report Status PENDING   Incomplete   MRSA PCR SCREENING     Status: Abnormal   Collection Time   12/02/12 10:55 AM      Component Value Range Status Comment   MRSA by PCR POSITIVE (*) NEGATIVE Final   CULTURE, EXPECTORATED SPUTUM-ASSESSMENT     Status: Normal   Collection Time   12/05/12  4:37 PM      Component Value Range Status Comment   Specimen Description SPUTUM   Final    Special Requests Immunocompromised   Final    Sputum evaluation     Final    Value: THIS SPECIMEN IS ACCEPTABLE. RESPIRATORY CULTURE REPORT TO FOLLOW.   Report Status 12/05/2012 FINAL   Final     Studies/Results: Dg Chest Port 1 View  12/05/2012  *RADIOLOGY REPORT*  Clinical Data: Shortness of breath, cough, pneumonia  PORTABLE CHEST - 1 VIEW  Comparison: 12/01/2012; 09/04/2012; chest CT - 12/01/2012  Findings:  Grossly unchanged enlarged cardiac silhouette and mediastinal contours.  Interval development of a partially loculated right- sided pleural effusion.  Worsening right mid and lower lung heterogeneous opacities.  No change to minimal increase in left basilar/retrocardiac opacity.  No left-sided pleural effusion.  No pneumothorax.  Unchanged bones.  IMPRESSION: Interval development of partially loculated parapneumonic right- sided pleural effusion worrisome for progression of previously identified multifocal infection, right greater than left, as demonstrated on recent chest CT.  A follow-up chest radiograph in 4 to 6 weeks after treatment is recommended to ensure resolution.   Original Report Authenticated By: Tacey Ruiz, MD     Medications: Scheduled Meds:    . benzonatate  200 mg Oral TID  . ceFEPime (MAXIPIME) IV  2 g Intravenous Q8H  . Chlorhexidine Gluconate Cloth  6 each Topical Q0600  .  gabapentin  600 mg Oral TID  . levalbuterol  0.63 mg Nebulization Q6H  . levofloxacin  500 mg Oral Daily  . mupirocin ointment  1 application Nasal BID  . vancomycin  1,000 mg Intravenous Q12H   Continuous Infusions:    . sodium chloride 1,000 mL (12/03/12 0837)   PRN Meds:.acetaminophen, albuterol, chlorpheniramine-HYDROcodone, guaifenesin, HYDROcodone-acetaminophen, ipratropium, morphine injection  Assessment/Plan:  1. Health care associated pneumonia Antibiotics broadened again to Vanc/Cefepime/Levaquin 1/18 given clinical worsening and R parapneumonic effusion Blood Cx negative, influenza PCR negative Check sputum Cx Swallow eval ok Albuterol/atrovent nebs  2. Parapneumonic effusion: Needs therapeutic and diagnostic thoracentesis today Coumadin on hold, received 2 units FFP yesterday but INR 2.3 this am, will give 1 unit FFP stat this am, in order to do the procedure D/w IR 1/18 Appreciate Pulm consult per Dr.Clance  2. Multiple myeloma: on Salvage Chemo, notified Dr.Mohammed,  Chemo  on hold  3. R sided chest pain:/ intercostal, musculoskeletal from muscle strain from coughing improving CTA negative for PE, and no lytic lesions in the location of his pain Continue IV morphine and vicodin PRN  4.  Vasculitis/ thrombosis, continue coumadin per pharmacy  5. 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 Anemia: secondary to myeloma, chemo and worsened by acute illness, no overt blood loss,  Stable now  Prophylaxis: coumadin, on hold  Disposition: home when improved Code Status: Full Code.  Family communication: discussed with patient and wife at bedside  Time spent coordinating care:  LOS: 5 days   [facility] Pager: [phone] 12/06/2012, 7:26 AM  Addendum: CXR with R parapneumonic effusion, worsening of airspace  disease: Requested Pulm consult per Dr.CLance Will broaden antibiotics again, IR consulted for therapeutic and diagnostic thoracentesis, hold coumadin FFP -2 units now Zannie Cove, MD

## 2012-12-06 NOTE — Progress Notes (Addendum)
PT is SOB on exertion. Sitting on side of bed. Feet and legs edematous, non-pitting. Family at the bedside. Pt. Medicated for Rt. Rib pain, sleeping on and off most the day. O2 2L via [place].Plasma infused. INR 2.14 PRO_TIME-23. Dr. Jomarie Longs informed. Thoracentisis to be done 1/20.

## 2012-12-06 NOTE — Plan of Care (Signed)
Problem: Phase II Progression Outcomes Goal: Vital signs remain stable Outcome: Progressing T-100.6

## 2012-12-07 ENCOUNTER — Inpatient Hospital Stay (HOSPITAL_COMMUNITY): Payer: 59

## 2012-12-07 LAB — VANCOMYCIN, TROUGH: Vancomycin Tr: 14.1 ug/mL (ref 10.0–20.0)

## 2012-12-07 LAB — BODY FLUID CELL COUNT WITH DIFFERENTIAL

## 2012-12-07 LAB — PREPARE FRESH FROZEN PLASMA: Unit division: 0

## 2012-12-07 LAB — BASIC METABOLIC PANEL
BUN: 14 mg/dL (ref 6–23)
CO2: 26 mEq/L (ref 19–32)
Calcium: 9.1 mg/dL (ref 8.4–10.5)
Chloride: 94 mEq/L — ABNORMAL LOW (ref 96–112)
Creatinine, Ser: 0.58 mg/dL (ref 0.50–1.35)
Glucose, Bld: 107 mg/dL — ABNORMAL HIGH (ref 70–99)

## 2012-12-07 LAB — CBC
HCT: 23.2 % — ABNORMAL LOW (ref 39.0–52.0)
MCH: 32.8 pg (ref 26.0–34.0)
MCV: 97.5 fL (ref 78.0–100.0)
RBC: 2.38 MIL/uL — ABNORMAL LOW (ref 4.22–5.81)
WBC: 7.7 10*3/uL (ref 4.0–10.5)

## 2012-12-07 LAB — PROTEIN, BODY FLUID: Total protein, fluid: 7.6 g/dL

## 2012-12-07 LAB — PROTEIN, TOTAL: Total Protein: 9.3 g/dL — ABNORMAL HIGH (ref 6.0–8.3)

## 2012-12-07 LAB — LACTATE DEHYDROGENASE, PLEURAL OR PERITONEAL FLUID: LD, Fluid: 499 U/L — ABNORMAL HIGH (ref 3–23)

## 2012-12-07 LAB — PROTIME-INR: INR: 1.78 — ABNORMAL HIGH (ref 0.00–1.49)

## 2012-12-07 MED ORDER — GABAPENTIN 300 MG PO CAPS
300.0000 mg | ORAL_CAPSULE | Freq: Three times a day (TID) | ORAL | Status: DC
Start: 2012-12-07 — End: 2012-12-20
  Administered 2012-12-07 – 2012-12-20 (×37): 300 mg via ORAL
  Filled 2012-12-07 (×45): qty 1

## 2012-12-07 NOTE — Progress Notes (Signed)
ANTIBIOTIC CONSULT NOTE - Follow Up  Pharmacy Consult for Vancomycin and Cefepime Indication: Pneumonia, aspiration vs. HCAP.  Allergies  Allergen Reactions  . Red Dye Anaphylaxis    Lips swollen  3-4 times their baseline size  . Other Other (See Comments)    Strawberries "anything containing red dye"   Patient Measurements: Height: 5\' 3"  (160 cm) Weight: 226 lb 10.1 oz (102.8 kg) IBW/kg (Calculated) : 56.9   Vital Signs: Temp: 98.1 F (36.7 C) (01/20 0506) Temp src: Oral (01/20 0506) BP: 129/62 mmHg (01/20 0506) Pulse Rate: 104  (01/20 0506) Intake/Output from previous day: 01/19 0701 - 01/20 0700 In: 3558.7 [I.V.:1625.2; Blood:537.5; IV Piggyback:550] Out: 1610 [Urine:1610] Intake/Output from this shift:    Labs:  [facility] 12/07/12 0752 12/06/12 0413  WBC 7.7 8.6  HGB 7.8* 7.9*  PLT PLATELET CLUMPS NOTED ON SMEAR, COUNT APPEARS ADEQUATE 261  LABCREA -- --  CREATININE 0.58 0.64   Estimated Creatinine Clearance: 117.7 ml/min (by C-G formula based on Cr of 0.58). No results found for this basename: VANCOTROUGH:2,VANCOPEAK:2,VANCORANDOM:2,GENTTROUGH:2,GENTPEAK:2,GENTRANDOM:2,TOBRATROUGH:2,TOBRAPEAK:2,TOBRARND:2,AMIKACINPEAK:2,AMIKACINTROU:2,AMIKACIN:2, in the last 72 hours   Microbiology: Recent Results (from the past 720 hour(s))  CULTURE, BLOOD (ROUTINE X 2)     Status: Normal (Preliminary result)   Collection Time   12/01/12  8:55 PM      Component Value Range Status Comment   Specimen Description BLOOD LEFT HAND   Final    Special Requests BOTTLES DRAWN AEROBIC AND ANAEROBIC 5CC   Final    Culture  Setup Time 12/02/2012 02:22   Final    Culture     Final    Value:        BLOOD CULTURE RECEIVED NO GROWTH TO DATE CULTURE WILL BE HELD FOR 5 DAYS BEFORE ISSUING A FINAL NEGATIVE REPORT   Report Status PENDING   Incomplete   CULTURE, BLOOD (ROUTINE X 2)     Status: Normal (Preliminary result)   Collection Time   12/01/12  9:02 PM      Component Value Range Status  Comment   Specimen Description BLOOD RIGHT HAND   Final    Special Requests BOTTLES DRAWN AEROBIC AND ANAEROBIC 5CC   Final    Culture  Setup Time 12/02/2012 02:22   Final    Culture     Final    Value:        BLOOD CULTURE RECEIVED NO GROWTH TO DATE CULTURE WILL BE HELD FOR 5 DAYS BEFORE ISSUING A FINAL NEGATIVE REPORT   Report Status PENDING   Incomplete   MRSA PCR SCREENING     Status: Abnormal   Collection Time   12/02/12 10:55 AM      Component Value Range Status Comment   MRSA by PCR POSITIVE (*) NEGATIVE Final   CULTURE, EXPECTORATED SPUTUM-ASSESSMENT     Status: Normal   Collection Time   12/05/12  4:37 PM      Component Value Range Status Comment   Specimen Description SPUTUM   Final    Special Requests Immunocompromised   Final    Sputum evaluation     Final    Value: THIS SPECIMEN IS ACCEPTABLE. RESPIRATORY CULTURE REPORT TO FOLLOW.   Report Status 12/05/2012 FINAL   Final   CULTURE, RESPIRATORY     Status: Normal (Preliminary result)   Collection Time   12/05/12  4:37 PM      Component Value Range Status Comment   Specimen Description SPUTUM   Final    Special Requests NONE  Final    Gram Stain PENDING   Incomplete    Culture Culture reincubated for better growth   Final    Report Status PENDING   Incomplete     Medical History: Past Medical History  Diagnosis Date  . Multiple myeloma(203.0) 09/24/2011  . Hypertension    Medications:  Anti-infectives     Start     Dose/Rate Route Frequency Ordered Stop   12/05/12 2200   vancomycin (VANCOCIN) IVPB 1000 mg/200 mL premix        1,000 mg 200 mL/hr over 60 Minutes Intravenous Every 12 hours 12/05/12 1013     12/05/12 1400   ceFEPIme (MAXIPIME) 2 g in dextrose 5 % 50 mL IVPB        2 g 100 mL/hr over 30 Minutes Intravenous 3 times per day 12/05/12 1013     12/05/12 1100   vancomycin (VANCOCIN) 2,000 mg in sodium chloride 0.9 % 500 mL IVPB        2,000 mg 250 mL/hr over 120 Minutes Intravenous  Once 12/05/12 1013  12/05/12 1559   12/04/12 1000   levofloxacin (LEVAQUIN) tablet 500 mg        500 mg Oral Daily 12/04/12 0734     12/02/12 0600   piperacillin-tazobactam (ZOSYN) IVPB 3.375 g  Status:  Discontinued        3.375 g 12.5 mL/hr over 240 Minutes Intravenous 3 times per day 12/02/12 0114 12/04/12 0734   12/02/12 0115   piperacillin-tazobactam (ZOSYN) IVPB 3.375 g        3.375 g 100 mL/hr over 30 Minutes Intravenous  Once 12/02/12 0112 12/02/12 0150   12/02/12 0115   oseltamivir (TAMIFLU) capsule 75 mg  Status:  Discontinued        75 mg Oral 2 times daily 12/02/12 0113 12/03/12 0740   12/01/12 2330   levofloxacin (LEVAQUIN) IVPB 750 mg  Status:  Discontinued        750 mg 100 mL/hr over 90 Minutes Intravenous Every 24 hours 12/01/12 2317 12/04/12 0734   12/01/12 2100   levofloxacin (LEVAQUIN) IVPB 750 mg        750 mg 100 mL/hr over 90 Minutes Intravenous  Once 12/01/12 2050 12/01/12 2250         Assessment:  52yo M with multiple myeloma admitted 1/14 with rib pain and pneumonia. Placed on Zosyn and Levaquin for suspected aspiration pna. Zosyn was stopped 1/17.  Continued coughing and more dyspneic. Broadening abx.  Day # 3 Vanc/Cefepime, D#6 PO Levaquin  Stable labs  Afebrile  Goal of Therapy:  Vancomycin trough level 15-20 mcg/ml  Plan:  1) Continue vancomycin 1g IV q12 for now - check trough tonight 2) Continue current Cefepime dosing 3) Recommend d/c of Levaquin as is Day #6 at this point - generally only recommended for 3 days for HCAP   Hessie Knows, PharmD, BCPS Pager [phone] 12/07/2012 1:13 PM

## 2012-12-07 NOTE — Progress Notes (Signed)
[facility]             Progress Note    Assessment/Plan: 1. Health care associated pneumonia Antibiotics broadened again to Vanc/Cefepime/Levaquin 1/18 given clinical worsening and R parapneumonic effusion Await culture data from pleural fluid prior to de-escalation Blood Cx negative, influenza PCR negative Check sputum Cx Swallow eval ok Albuterol/atrovent nebs  2. Parapneumonic effusion: Needs therapeutic and diagnostic thoracentesis  Coumadin on hold, received 2 units FFP 1/19 for INR 2.1, INR down to 1.7 today for thoracentesis today D/w IR 1/18 Appreciate Pulm consult per Dr.Clance  2. Multiple myeloma: on [facility] Chemo, notified Dr.Mohammed,  Chemo on hold  3. R sided chest pain:/ intercostal, musculoskeletal from muscle strain from coughing and pleural effusion improving CTA negative for PE, and no lytic lesions in the location of his pain Continue IV morphine and vicodin PRN  4.  Vasculitis/ thrombosis, continue to hold coumadin pending thoracentesis  5. 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 Anemia: secondary to myeloma, chemo and worsened by acute illness, no overt blood loss,  monitor  Prophylaxis: coumadin, on hold  Disposition: home when improved Code Status: Full Code.  Family communication: discussed with patient and wife at bedside  Time spent coordinating care:  LOS: 6 days   [name] [facility] Pager: [phone] 12/07/2012, 12:11 PM      Subjective: Still coughing quite a lot, breathing worse with activity, Pain in the R lower chest intercostal space with coughing  Objective: Vital signs in last 24 hours: Temp:  [98.1 F (36.7 C)-99.3 F (37.4 C)] 98.1 F (36.7 C) (01/20 0506) Pulse Rate:  [99-104] 104  (01/20 0506) Resp:  [20-26] 20  (01/20 0506) BP: (106-136)/(50-67) 129/62 mmHg  (01/20 0506) SpO2:  [95 %-99 %] 98 % (01/20 1000) Weight change:  Last BM Date: 12/05/12  Intake/Output from previous day: 01/19 0701 - 01/20 0700 In: 3558.7 [I.V.:1625.2; Blood:537.5; IV Piggyback:550] Out: 1610 [Urine:1610]     Physical Exam: General: Alert, awake, oriented x3, in mild acute distress. HEENT: No bruits, no goiter. Heart: Regular rate and rhythm, without murmurs, rubs, gallops. Lungs: bilateral ronchi, decreased BS at R base. Chest: mild tenderness in R intercostal muscles in mid axillary line Abdomen: Soft, nontender, nondistended, positive bowel sounds. Extremities: trace edema, No clubbing cyanosis with positive pedal pulses. Neuro: Grossly intact, nonfocal.    Lab Results: Basic Metabolic Panel:  Basename 12/07/12 0752 12/06/12 0413  NA 132* 128*  K 3.2* 3.5  CL 94* 96  CO2 26 22  GLUCOSE 107* 89  BUN 14 19  CREATININE 0.58 0.64  CALCIUM 9.1 9.0  MG -- --  PHOS -- --   Liver Function Tests:  Basename 12/07/12 0752  AST --  ALT --  ALKPHOS --  BILITOT --  PROT 9.3*  ALBUMIN 2.4*   No results found for this basename: LIPASE:2,AMYLASE:2 in the last 72 hours No results found for this basename: AMMONIA:2 in the last 72 hours CBC:  Basename 12/07/12 0752 12/06/12 0413  WBC 7.7 8.6  NEUTROABS -- --  HGB 7.8* 7.9*  HCT 23.2* 24.0*  MCV 97.5 98.8  PLT PLATELET CLUMPS NOTED ON SMEAR, COUNT APPEARS ADEQUATE 261   Cardiac Enzymes: No results found for this basename: CKTOTAL:3,CKMB:3,CKMBINDEX:3,TROPONINI:3 in the last 72 hours BNP: No results found for this basename: PROBNP:3 in the last 72 hours D-Dimer: No results found for this basename:  DDIMER:2 in the last 72 hours CBG: No results found for this basename: GLUCAP:6 in the last 72 hours Hemoglobin A1C: No results found for this basename: HGBA1C in the last 72 hours Fasting Lipid Panel: No results found for this basename: CHOL,HDL,LDLCALC,TRIG,CHOLHDL,LDLDIRECT in the last 72  hours Thyroid Function Tests: No results found for this basename: TSH,T4TOTAL,FREET4,T3FREE,THYROIDAB in the last 72 hours Anemia Panel: No results found for this basename: VITAMINB12,FOLATE,FERRITIN,TIBC,IRON,RETICCTPCT in the last 72 hours Coagulation:  Basename 12/07/12 0950 12/06/12 1325  LABPROT 20.1* 23.0*  INR 1.78* 2.14*   Urine Drug Screen: Drugs of Abuse  No results found for this basename: labopia,  cocainscrnur,  labbenz,  amphetmu,  thcu,  labbarb    Alcohol Level: No results found for this basename: ETH:2 in the last 72 hours Urinalysis: No results found for this basename: COLORURINE:2,APPERANCEUR:2,LABSPEC:2,PHURINE:2,GLUCOSEU:2,HGBUR:2,BILIRUBINUR:2,KETONESUR:2,PROTEINUR:2,UROBILINOGEN:2,NITRITE:2,LEUKOCYTESUR:2 in the last 72 hours  Recent Results (from the past 240 hour(s))  CULTURE, BLOOD (ROUTINE X 2)     Status: Normal (Preliminary result)   Collection Time   12/01/12  8:55 PM      Component Value Range Status Comment   Specimen Description BLOOD LEFT HAND   Final    Special Requests BOTTLES DRAWN AEROBIC AND ANAEROBIC 5CC   Final    Culture  Setup Time 12/02/2012 02:22   Final    Culture     Final    Value:        BLOOD CULTURE RECEIVED NO GROWTH TO DATE CULTURE WILL BE HELD FOR 5 DAYS BEFORE ISSUING A FINAL NEGATIVE REPORT   Report Status PENDING   Incomplete   CULTURE, BLOOD (ROUTINE X 2)     Status: Normal (Preliminary result)   Collection Time   12/01/12  9:02 PM      Component Value Range Status Comment   Specimen Description BLOOD RIGHT HAND   Final    Special Requests BOTTLES DRAWN AEROBIC AND ANAEROBIC 5CC   Final    Culture  Setup Time 12/02/2012 02:22   Final    Culture     Final    Value:        BLOOD CULTURE RECEIVED NO GROWTH TO DATE CULTURE WILL BE HELD FOR 5 DAYS BEFORE ISSUING A FINAL NEGATIVE REPORT   Report Status PENDING   Incomplete   MRSA PCR SCREENING     Status: Abnormal   Collection Time   12/02/12 10:55 AM      Component Value  Range Status Comment   MRSA by PCR POSITIVE (*) NEGATIVE Final   CULTURE, EXPECTORATED SPUTUM-ASSESSMENT     Status: Normal   Collection Time   12/05/12  4:37 PM      Component Value Range Status Comment   Specimen Description SPUTUM   Final    Special Requests Immunocompromised   Final    Sputum evaluation     Final    Value: THIS SPECIMEN IS ACCEPTABLE. RESPIRATORY CULTURE REPORT TO FOLLOW.   Report Status 12/05/2012 FINAL   Final   CULTURE, RESPIRATORY     Status: Normal (Preliminary result)   Collection Time   12/05/12  4:37 PM      Component Value Range Status Comment   Specimen Description SPUTUM   Final    Special Requests NONE   Final    Gram Stain PENDING   Incomplete    Culture Culture reincubated for better growth   Final    Report Status PENDING   Incomplete     Studies/Results: No results  found.  Medications: Scheduled Meds:    . benzonatate  200 mg Oral TID  . ceFEPime (MAXIPIME) IV  2 g Intravenous Q8H  . Chlorhexidine Gluconate Cloth  6 each Topical Q0600  . gabapentin  300 mg Oral TID  . levalbuterol  0.63 mg Nebulization Q6H  . levofloxacin  500 mg Oral Daily  . vancomycin  1,000 mg Intravenous Q12H   Continuous Infusions:    . sodium chloride 1,000 mL (12/03/12 0837)   PRN Meds:.acetaminophen, albuterol, chlorpheniramine-HYDROcodone, guaifenesin, HYDROcodone-acetaminophen, ipratropium, morphine injection

## 2012-12-07 NOTE — Progress Notes (Signed)
Pt. Tolerated thorocentesis, Bandaid to rt. Mid back remains dry and intact. Pt. Continues to cough up white thick sputum. Poor appetite today. Wife at bedside. K-pad to RT flank area. Sleeping after pain medicine given.

## 2012-12-07 NOTE — Consult Note (Signed)
    REFERRING PHYSICIAN:  [facility].  HISTORY OF PRESENT ILLNESS:  The patient is a 52 year old male pccm asked to see 1/19 for worsening chest x-ray and dyspnea.  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    BP 129/62  Pulse 104  Temp 98.1 F (36.7 C) (Oral)  Resp 20  Ht 5\' 3"  (1.6 m)  Wt 102.8 kg (226 lb 10.1 oz)  BMI 40.15 kg/m2  SpO2 98%  HEENT:  [place], no JVD  CHEST:  Reveals very diminished breath sounds on the right with few crackles, left clear to auscultation. CARDIAC:  Reveals mild tachycardia, regular rhythm with 2/6 systolic murmur. ABDOMEN:  Soft, nontender, nondistended.  Good bowel sounds. EXTREMITIES:  Lower extremities with mild edema, no cyanosis noted. There is no calf tenderness. NEUROLOGICAL:  He is alert and oriented, and moves all 4 extremities.  Lab 12/07/12 0752 12/06/12 0413 12/04/12 0400  NA 132* 128* 133*  K 3.2* 3.5 3.4*  CL 94* 96 101  CO2 26 22 24   BUN 14 19 17   CREATININE 0.58 0.64 0.64  GLUCOSE 107* 89 118*    Lab 12/07/12 0752 12/06/12 0413 12/04/12 0400  HGB 7.8* 7.9* 8.5*  HCT 23.2* 24.0* 25.1*  WBC 7.7 8.6 9.1  PLT PLATELET CLUMPS NOTED ON SMEAR, COUNT APPEARS ADEQUATE 261 265   Lab Results  Component Value Date   INR 1.78* 12/07/2012   INR 2.14* 12/06/2012   INR 2.35* 12/06/2012   PROTIME 45.6* 11/16/2012   PROTIME 25.2* 10/27/2012   PROTIME 24.0* 10/12/2012     PCXR: right > left airspace disease w/ what appears to be loculated effusion    IMPRESSION: Right lower lobe pneumonia with associated worsening right pleural effusion (appears loculated) - Thoracentesis 1/20 > 150 cc hazy amber fluid exudate   Suspect will need vats  Sandrea Hughs, MD Pulmonary and Critical Care Medicine [facility] Cell [phone] After 5:30 PM or weekends, call [phone]

## 2012-12-07 NOTE — Procedures (Signed)
Successful US guided right thoracentesis. Yielded of hazy amber colored fluid. Pt tolerated procedure well. No immediate complications.  Specimen was sent for labs. CXR ordered.  Brayton El PA-C 12/07/2012 2:09 PM

## 2012-12-07 NOTE — Progress Notes (Signed)
Pt remain SOB with activity. Tolerated 2 units of FFP's without any problem, no reaction noted.

## 2012-12-08 ENCOUNTER — Inpatient Hospital Stay (HOSPITAL_COMMUNITY): Payer: 59

## 2012-12-08 LAB — PREPARE RBC (CROSSMATCH)

## 2012-12-08 LAB — BASIC METABOLIC PANEL
Chloride: 98 mEq/L (ref 96–112)
Creatinine, Ser: 0.54 mg/dL (ref 0.50–1.35)
GFR calc Af Amer: >90 mL/min (ref >90)
Sodium: 133 mEq/L — ABNORMAL LOW (ref 135–145)

## 2012-12-08 LAB — CULTURE, RESPIRATORY W GRAM STAIN

## 2012-12-08 LAB — CBC
MCV: 99.6 fL (ref 78.0–100.0)
Platelets: 256 10*3/uL (ref 150–400)
RDW: 18.8 % — ABNORMAL HIGH (ref 11.5–15.5)
WBC: 6.9 10*3/uL (ref 4.0–10.5)

## 2012-12-08 LAB — CULTURE, BLOOD (ROUTINE X 2)

## 2012-12-08 LAB — BLOOD GAS, ARTERIAL
Bicarbonate: 25.7 mEq/L — ABNORMAL HIGH (ref 20.0–24.0)
FIO2: 0.21 %
Patient temperature: 98.6
pH, Arterial: 7.484 — ABNORMAL HIGH (ref 7.350–7.450)

## 2012-12-08 LAB — PREPARE FRESH FROZEN PLASMA: Unit division: 0

## 2012-12-08 LAB — PROTIME-INR
INR: 1.58 — ABNORMAL HIGH (ref 0.00–1.49)
Prothrombin Time: 18.4 seconds — ABNORMAL HIGH (ref 11.6–15.2)

## 2012-12-08 LAB — VANCOMYCIN, TROUGH: Vancomycin Tr: <5 ug/mL — ABNORMAL LOW (ref 10.0–20.0)

## 2012-12-08 MED ORDER — ENOXAPARIN SODIUM 40 MG/0.4ML ~~LOC~~ SOLN
40.0000 mg | SUBCUTANEOUS | Status: DC
  Administered 2012-12-08 – 2012-12-09 (×2): 40 mg via SUBCUTANEOUS
  Filled 2012-12-08 (×2): qty 0.4

## 2012-12-08 MED ORDER — VANCOMYCIN HCL 10 G IV SOLR
1500.0000 mg | Freq: Two times a day (BID) | INTRAVENOUS | Status: DC
  Filled 2012-12-08: qty 1500

## 2012-12-08 NOTE — Progress Notes (Signed)
Blood transfusion begun at 7 pm. Order for transfusion to start in am if hgb 7. Notified Dr. Ceasar Mons MD on call for Dr. Donata Clay. OK to continue transfusion per Dr. Laneta Simmers.[name]

## 2012-12-08 NOTE — Progress Notes (Addendum)
[facility]             Progress Note    Assessment/Plan: 1. Health care associated pneumonia Antibiotics broadened again to Vanc/Cefepime/Levaquin 1/18 given clinical worsening and R parapneumonic effusion Blood Cx negative, influenza PCR negative, sputum cultures negative pleural fluid with few WBCs, nor organisms on gram stain Will DC Vanc today, continue levaquin and cefepime Swallow eval ok Albuterol/atrovent nebs  2. Parapneumonic effusion: S/p thoracentesis, only fluid drained, fluid analysis with fluid/serum protein ratio of 0.8 and LDH ratio of 0.6 suggestive of exudative etiology from pneumonia Await Pulm input about further management D/W pulmonary, recommend TCTS eval for VATS/drainage  2. Multiple myeloma: on Salvage Chemo, notified Dr.Mohammed,  Chemo on hold  3. R sided chest pain:/ intercostal, musculoskeletal from muscle strain from coughing and pleural effusion improving CTA negative for PE, and no lytic lesions in the location of his pain Continue IV morphine and vicodin PRN  4.  Vasculitis/ thrombosis, continue to hold coumadin pending procedures -SQ lovenox for prophylaxis in the interim  5. 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 Anemia: secondary to myeloma, chemo and worsened by acute illness, no overt blood loss,  Monitor, if trends below 7 will tranfuse  Prophylaxis: INR 1.5, coumadin on hold, add SQ loevnox 40mg  daily  Disposition: home when improved Code Status: Full Code.  Family communication: discussed with patient and wife at bedside  Time spent coordinating care:  LOS: 6 days   [place]i Coupland [facility] Pager: [phone] 12/07/2012, 12:11 PM      Subjective: Still coughing quite a lot, breathing worse with activity, Pain in the R lower chest intercostal space with  coughing  Objective: Vital signs in last 24 hours: Temp:  [98.1 F (36.7 C)-100.5 F (38.1 C)] 98.1 F (36.7 C) (01/21 0507) Pulse Rate:  [111] 111  (01/20 1519) Resp:  [18] 18  (01/21 0507) BP: (123-142)/(56-70) 127/56 mmHg (01/21 0507) SpO2:  [95 %-100 %] 100 % (01/21 0507) Weight change:  Last BM Date: 12/05/12  Intake/Output from previous day: 01/20 0701 - 01/21 0700 In: 1204.8 [P.O.:840; I.V.:114.8; IV Piggyback:250] Out: 460 [Urine:460]     Physical Exam: General: Alert, awake, oriented x3, in mild acute distress. HEENT: No bruits, no goiter. Heart: Regular rate and rhythm, without murmurs, rubs, gallops. Lungs: bilateral ronchi, decreased BS at R base. Chest: mild tenderness in R intercostal muscles in mid axillary line Abdomen: Soft, nontender, nondistended, positive bowel sounds. Extremities: trace edema, No clubbing cyanosis with positive pedal pulses. Neuro: Grossly intact, nonfocal.    Lab Results: Basic Metabolic Panel:  Basename 12/08/12 0356 12/07/12 0752  NA 133* 132*  K 3.3* 3.2*  CL 98 94*  CO2 25 26  GLUCOSE 94 107*  BUN 11 14  CREATININE 0.54 0.58  CALCIUM 8.8 9.1  MG -- --  PHOS -- --   Liver Function Tests:  Basename 12/07/12 0752  AST --  ALT --  ALKPHOS --  BILITOT --  PROT 9.3*  ALBUMIN 2.4*   No results found for this basename: LIPASE:2,AMYLASE:2 in the last 72 hours No results found for this basename: AMMONIA:2 in the last 72 hours CBC:  Basename 12/08/12 0356 12/07/12 0752  WBC 6.9 7.7  NEUTROABS -- --  HGB 7.5* 7.8*  HCT 22.6* 23.2*  MCV 99.6 97.5  PLT 256 PLATELET CLUMPS NOTED ON SMEAR, COUNT APPEARS ADEQUATE  Cardiac Enzymes: No results found for this basename: CKTOTAL:3,CKMB:3,CKMBINDEX:3,TROPONINI:3 in the last 72 hours BNP: No results found for this basename: PROBNP:3 in the last 72 hours D-Dimer: No results found for this basename: DDIMER:2 in the last 72 hours CBG: No results found for this basename:  GLUCAP:6 in the last 72 hours Hemoglobin A1C: No results found for this basename: HGBA1C in the last 72 hours Fasting Lipid Panel: No results found for this basename: CHOL,HDL,LDLCALC,TRIG,CHOLHDL,LDLDIRECT in the last 72 hours Thyroid Function Tests: No results found for this basename: TSH,T4TOTAL,FREET4,T3FREE,THYROIDAB in the last 72 hours Anemia Panel: No results found for this basename: VITAMINB12,FOLATE,FERRITIN,TIBC,IRON,RETICCTPCT in the last 72 hours Coagulation:  Basename 12/08/12 0356 12/07/12 0950  LABPROT 18.4* 20.1*  INR 1.58* 1.78*   Urine Drug Screen: Drugs of Abuse  No results found for this basename: labopia,  cocainscrnur,  labbenz,  amphetmu,  thcu,  labbarb    Alcohol Level: No results found for this basename: ETH:2 in the last 72 hours Urinalysis: No results found for this basename: COLORURINE:2,APPERANCEUR:2,LABSPEC:2,PHURINE:2,GLUCOSEU:2,HGBUR:2,BILIRUBINUR:2,KETONESUR:2,PROTEINUR:2,UROBILINOGEN:2,NITRITE:2,LEUKOCYTESUR:2 in the last 72 hours  Recent Results (from the past 240 hour(s))  CULTURE, BLOOD (ROUTINE X 2)     Status: Normal (Preliminary result)   Collection Time   12/01/12  8:55 PM      Component Value Range Status Comment   Specimen Description BLOOD LEFT HAND   Final    Special Requests BOTTLES DRAWN AEROBIC AND ANAEROBIC 5CC   Final    Culture  Setup Time 12/02/2012 02:22   Final    Culture     Final    Value:        BLOOD CULTURE RECEIVED NO GROWTH TO DATE CULTURE WILL BE HELD FOR 5 DAYS BEFORE ISSUING A FINAL NEGATIVE REPORT   Report Status PENDING   Incomplete   CULTURE, BLOOD (ROUTINE X 2)     Status: Normal (Preliminary result)   Collection Time   12/01/12  9:02 PM      Component Value Range Status Comment   Specimen Description BLOOD RIGHT HAND   Final    Special Requests BOTTLES DRAWN AEROBIC AND ANAEROBIC 5CC   Final    Culture  Setup Time 12/02/2012 02:22   Final    Culture     Final    Value:        BLOOD CULTURE RECEIVED NO  GROWTH TO DATE CULTURE WILL BE HELD FOR 5 DAYS BEFORE ISSUING A FINAL NEGATIVE REPORT   Report Status PENDING   Incomplete   MRSA PCR SCREENING     Status: Abnormal   Collection Time   12/02/12 10:55 AM      Component Value Range Status Comment   MRSA by PCR POSITIVE (*) NEGATIVE Final   CULTURE, EXPECTORATED SPUTUM-ASSESSMENT     Status: Normal   Collection Time   12/05/12  4:37 PM      Component Value Range Status Comment   Specimen Description SPUTUM   Final    Special Requests Immunocompromised   Final    Sputum evaluation     Final    Value: THIS SPECIMEN IS ACCEPTABLE. RESPIRATORY CULTURE REPORT TO FOLLOW.   Report Status 12/05/2012 FINAL   Final   CULTURE, RESPIRATORY     Status: Normal (Preliminary result)   Collection Time   12/05/12  4:37 PM      Component Value Range Status Comment   Specimen Description SPUTUM   Final    Special Requests NONE   Final  Gram Stain PENDING   Incomplete    Culture Culture reincubated for better growth   Final    Report Status PENDING   Incomplete   BODY FLUID CULTURE     Status: Normal (Preliminary result)   Collection Time   12/07/12  2:15 PM      Component Value Range Status Comment   Specimen Description PLEURAL   Final    Special Requests NONE   Final    Gram Stain     Final    Value: FEW WBC PRESENT, PREDOMINANTLY MONONUCLEAR     NO ORGANISMS SEEN   Culture NO GROWTH   Final    Report Status PENDING   Incomplete     Studies/Results: Dg Chest 1 View  12/07/2012  *RADIOLOGY REPORT*  Clinical Data: Status post right thoracentesis.  CHEST - 1 VIEW  Comparison: Plain film of the chest 12/05/2012 and CT chest 12/01/2012.  Findings: Loculated appearing right pleural effusion appears decreased in size after thoracentesis.  No pneumothorax is identified.  Left lung is clear.  Heart size is upper normal.  IMPRESSION: Decreased right pleural effusion after thoracentesis.  No pneumothorax.   Original Report Authenticated By: Holley Dexter,  M.D.    US Thoracentesis Asp Pleural Space W/img Guide  12/07/2012  *RADIOLOGY REPORT*  Clinical Data:  Pneumonia, right-sided parapneumonic effusion  ULTRASOUND GUIDED right THORACENTESIS  Comparison:  None  An ultrasound guided thoracentesis was thoroughly discussed with the patient and questions answered.  The benefits, risks, alternatives and complications were also discussed.  The patient understands and wishes to proceed with the procedure.  Written consent was obtained.  Ultrasound was performed to localize and mark an adequate pocket of fluid in the right chest.  A small, loculated effusion was identified and the largest area was targeted for thoracentesis. The area was then prepped and draped in the normal sterile fashion.  1% Lidocaine was used for local anesthesia.  Under ultrasound guidance a 19 gauge Yueh catheter was introduced.  Thoracentesis was performed.  The catheter was removed and a dressing applied.  Complications:  None immediate  Findings: A total of approximately 150 ml of hazy amber colored fluid was removed. A fluid sample was sent for laboratory analysis.  IMPRESSION: Successful ultrasound guided right thoracentesis of small loculated effusion, yielding 150 ml of pleural fluid.  Read by Brayton El PA-C   Original Report Authenticated By: Irish Lack, M.D.     Medications: Scheduled Meds:    . benzonatate  200 mg Oral TID  . ceFEPime (MAXIPIME) IV  2 g Intravenous Q8H  . enoxaparin (LOVENOX) injection  40 mg Subcutaneous Q24H  . gabapentin  300 mg Oral TID  . levalbuterol  0.63 mg Nebulization Q6H  . levofloxacin  500 mg Oral Daily  . vancomycin  1,500 mg Intravenous Q12H   Continuous Infusions:    . sodium chloride 1,000 mL (12/03/12 0837)   PRN Meds:.acetaminophen, albuterol, chlorpheniramine-HYDROcodone, guaifenesin, HYDROcodone-acetaminophen, ipratropium, morphine injection

## 2012-12-08 NOTE — Progress Notes (Signed)
ANTIBIOTIC CONSULT NOTE - Follow Up  Pharmacy Consult for Vancomycin and Cefepime Indication: Pneumonia, aspiration vs. HCAP.  Allergies  Allergen Reactions  . Red Dye Anaphylaxis    Lips swollen  3-4 times their baseline size  . Other Other (See Comments)    Strawberries "anything containing red dye"   Patient Measurements: Height: 5\' 3"  (160 cm) Weight: 226 lb 10.1 oz (102.8 kg) IBW/kg (Calculated) : 56.9   Vital Signs: Temp: 98.1 F (36.7 C) (01/21 0507) Temp src: Oral (01/21 0507) BP: 127/56 mmHg (01/21 0507) Intake/Output from previous day: 01/20 0701 - 01/21 0700 In: 1204.8 [P.O.:840; I.V.:114.8; IV Piggyback:250] Out: 460 [Urine:460] Intake/Output from this shift:    Labs:  Basename 12/08/12 0356 12/07/12 0752 12/06/12 0413  WBC 6.9 7.7 8.6  HGB 7.5* 7.8* 7.9*  PLT 256 PLATELET CLUMPS NOTED ON SMEAR, COUNT APPEARS ADEQUATE 261  LABCREA -- -- --  CREATININE 0.54 0.58 0.64   Estimated Creatinine Clearance: 117.7 ml/min (by C-G formula based on Cr of 0.54).  Basename 12/08/12 0530 12/07/12 2115  VANCOTROUGH <5.0* 14.1  VANCOPEAK -- --  Drue Dun -- --  GENTTROUGH -- --  GENTPEAK -- --  GENTRANDOM -- --  TOBRATROUGH -- --  TOBRAPEAK -- --  TOBRARND -- --  AMIKACINPEAK -- --  AMIKACINTROU -- --  AMIKACIN -- --     Microbiology: Recent Results (from the past 720 hour(s))  CULTURE, BLOOD (ROUTINE X 2)     Status: Normal (Preliminary result)   Collection Time   12/01/12  8:55 PM      Component Value Range Status Comment   Specimen Description BLOOD LEFT HAND   Final    Special Requests BOTTLES DRAWN AEROBIC AND ANAEROBIC 5CC   Final    Culture  Setup Time 12/02/2012 02:22   Final    Culture     Final    Value:        BLOOD CULTURE RECEIVED NO GROWTH TO DATE CULTURE WILL BE HELD FOR 5 DAYS BEFORE ISSUING A FINAL NEGATIVE REPORT   Report Status PENDING   Incomplete   CULTURE, BLOOD (ROUTINE X 2)     Status: Normal (Preliminary result)   Collection  Time   12/01/12  9:02 PM      Component Value Range Status Comment   Specimen Description BLOOD RIGHT HAND   Final    Special Requests BOTTLES DRAWN AEROBIC AND ANAEROBIC 5CC   Final    Culture  Setup Time 12/02/2012 02:22   Final    Culture     Final    Value:        BLOOD CULTURE RECEIVED NO GROWTH TO DATE CULTURE WILL BE HELD FOR 5 DAYS BEFORE ISSUING A FINAL NEGATIVE REPORT   Report Status PENDING   Incomplete   MRSA PCR SCREENING     Status: Abnormal   Collection Time   12/02/12 10:55 AM      Component Value Range Status Comment   MRSA by PCR POSITIVE (*) NEGATIVE Final   CULTURE, EXPECTORATED SPUTUM-ASSESSMENT     Status: Normal   Collection Time   12/05/12  4:37 PM      Component Value Range Status Comment   Specimen Description SPUTUM   Final    Special Requests Immunocompromised   Final    Sputum evaluation     Final    Value: THIS SPECIMEN IS ACCEPTABLE. RESPIRATORY CULTURE REPORT TO FOLLOW.   Report Status 12/05/2012 FINAL   Final  CULTURE, RESPIRATORY     Status: Normal (Preliminary result)   Collection Time   12/05/12  4:37 PM      Component Value Range Status Comment   Specimen Description SPUTUM   Final    Special Requests NONE   Final    Gram Stain PENDING   Incomplete    Culture Culture reincubated for better growth   Final    Report Status PENDING   Incomplete   BODY FLUID CULTURE     Status: Normal (Preliminary result)   Collection Time   12/07/12  2:15 PM      Component Value Range Status Comment   Specimen Description PLEURAL   Final    Special Requests NONE   Final    Gram Stain     Final    Value: FEW WBC PRESENT, PREDOMINANTLY MONONUCLEAR     NO ORGANISMS SEEN   Culture PENDING   Incomplete    Report Status PENDING   Incomplete     Medical History: Past Medical History  Diagnosis Date  . Multiple myeloma(203.0) 09/24/2011  . Hypertension    Medications:  Anti-infectives     Start     Dose/Rate Route Frequency Ordered Stop   12/05/12 2200    vancomycin (VANCOCIN) IVPB 1000 mg/200 mL premix        1,000 mg 200 mL/hr over 60 Minutes Intravenous Every 12 hours 12/05/12 1013     12/05/12 1400   ceFEPIme (MAXIPIME) 2 g in dextrose 5 % 50 mL IVPB        2 g 100 mL/hr over 30 Minutes Intravenous 3 times per day 12/05/12 1013     12/05/12 1100   vancomycin (VANCOCIN) 2,000 mg in sodium chloride 0.9 % 500 mL IVPB        2,000 mg 250 mL/hr over 120 Minutes Intravenous  Once 12/05/12 1013 12/05/12 1559   12/04/12 1000   levofloxacin (LEVAQUIN) tablet 500 mg        500 mg Oral Daily 12/04/12 0734     12/02/12 0600   piperacillin-tazobactam (ZOSYN) IVPB 3.375 g  Status:  Discontinued        3.375 g 12.5 mL/hr over 240 Minutes Intravenous 3 times per day 12/02/12 0114 12/04/12 0734   12/02/12 0115   piperacillin-tazobactam (ZOSYN) IVPB 3.375 g        3.375 g 100 mL/hr over 30 Minutes Intravenous  Once 12/02/12 0112 12/02/12 0150   12/02/12 0115   oseltamivir (TAMIFLU) capsule 75 mg  Status:  Discontinued        75 mg Oral 2 times daily 12/02/12 0113 12/03/12 0740   12/01/12 2330   levofloxacin (LEVAQUIN) IVPB 750 mg  Status:  Discontinued        750 mg 100 mL/hr over 90 Minutes Intravenous Every 24 hours 12/01/12 2317 12/04/12 0734   12/01/12 2100   levofloxacin (LEVAQUIN) IVPB 750 mg        750 mg 100 mL/hr over 90 Minutes Intravenous  Once 12/01/12 2050 12/01/12 2250         Assessment:  51yo M with multiple myeloma admitted 1/14 with rib pain and pneumonia. Placed on Zosyn and Levaquin for suspected aspiration pna. Zosyn was stopped 1/17.  Continued coughing and more dyspneic. Broadening abx.  Day # 4 Vanc/Cefepime, D#7 PO Levaquin  Stable labs, CrCl~180 (N)  Goal of Therapy:  Vancomycin trough level 15-20 mcg/ml  Plan:  1) Increase Vancomycin to 1500mg  IV q12h 2) Continue current  Cefepime dosing 3) Recommend d/c of Levaquin as is Day #7 at this point - generally only recommended for 3 days for HCAP   Lorenza Evangelist  Pager [phone] 12/08/2012 7:29 AM

## 2012-12-08 NOTE — Progress Notes (Signed)
  Subjective: Rapidly progressive loculated R effusion- immunosuppressed for myeloma RLL pneumonia Thoracentesis only removed 150cc  Objective: Vital signs in last 24 hours: Temp:  [98 F (36.7 C)-98.5 F (36.9 C)] 98 F (36.7 C) (01/21 1339) Pulse Rate:  [93] 93  (01/21 1339) Cardiac Rhythm:  [-]  Resp:  [18-20] 20  (01/21 1339) BP: (115-127)/(56-60) 115/60 mmHg (01/21 1339) SpO2:  [93 %-100 %] 93 % (01/21 1529)  Hemodynamic parameters for last 24 hours:  stable  Intake/Output from previous day: 01/20 0701 - 01/21 0700 In: 1204.8 [P.O.:840; I.V.:114.8; IV Piggyback:250] Out: 460 [Urine:460] Intake/Output this shift: Total I/O In: 480 [P.O.:480] Out: 400 [Urine:400]    Lab Results:  Basename 12/08/12 0356 12/07/12 0752  WBC 6.9 7.7  HGB 7.5* 7.8*  HCT 22.6* 23.2*  PLT 256 PLATELET CLUMPS NOTED ON SMEAR, COUNT APPEARS ADEQUATE   BMET:  Basename 12/08/12 0356 12/07/12 0752  NA 133* 132*  K 3.3* 3.2*  CL 98 94*  CO2 25 26  GLUCOSE 94 107*  BUN 11 14  CREATININE 0.54 0.58  CALCIUM 8.8 9.1    PT/INR:  Basename 12/08/12 0356  LABPROT 18.4*  INR 1.58*   ABG    Component Value Date/Time   PHART 7.476* 12/01/2012 2312   HCO3 22.0 12/01/2012 2312   TCO2 20.5 12/01/2012 2312   ACIDBASEDEF 0.7 12/01/2012 2312   O2SAT 96.5 12/01/2012 2312   CBG (last 3)  No results found for this basename: GLUCAP:3 in the last 72 hours  Assessment/Plan: S/P   Will plan R vats and [facility] later this week thurs/fri, hold coumadin Transfuse preop for Hb 7 Rec transfer to [facility] tomorrow so if he deteriorates he could get on OR sched sooner   LOS: 7 days    VAN TRIGT III,[name] 12/08/2012

## 2012-12-08 NOTE — Progress Notes (Signed)
    REFERRING PHYSICIAN:  [facility].  HISTORY OF PRESENT ILLNESS:  The patient is a 52 year old male pccm asked to see 1/19 for worsening chest x-ray and dyspnea.  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    BP 115/60  Pulse 93  Temp 98 F (36.7 C) (Oral)  Resp 20  Ht 5\' 3"  (1.6 m)  Wt 102.8 kg (226 lb 10.1 oz)  BMI 40.15 kg/m2  SpO2 95% 2 liters   HEENT:  [name], no JVD  CHEST:  Reveals very diminished breath sounds on the right, left clear to auscultation. CARDIAC:  Reveals mild tachycardia, regular rhythm with 2/6 systolic murmur. ABDOMEN:  Soft, nontender, nondistended.  Good bowel sounds. EXTREMITIES:  Lower extremities with mild edema, no cyanosis noted. There is no calf tenderness. NEUROLOGICAL:  He is alert and oriented, and moves all 4 extremities.  Lab 12/08/12 0356 12/07/12 0752 12/06/12 0413  NA 133* 132* 128*  K 3.3* 3.2* 3.5  CL 98 94* 96  CO2 25 26 22   BUN 11 14 19   CREATININE 0.54 0.58 0.64  GLUCOSE 94 107* 89    Lab 12/08/12 0356 12/07/12 0752 12/06/12 0413  HGB 7.5* 7.8* 7.9*  HCT 22.6* 23.2* 24.0*  WBC 6.9 7.7 8.6  PLT 256 PLATELET CLUMPS NOTED ON SMEAR, COUNT APPEARS ADEQUATE 261   Lab Results  Component Value Date   INR 1.58* 12/08/2012   INR 1.78* 12/07/2012   INR 2.14* 12/06/2012   PROTIME 45.6* 11/16/2012   PROTIME 25.2* 10/27/2012   PROTIME 24.0* 10/12/2012     PCXR: right > left airspace disease w/  loculated effusion    IMPRESSION: Right lower lobe pneumonia with associated worsening complicated right pleural effusion (appears loculated) - Thoracentesis 1/20 > 150 cc hazy amber fluid exudate    REC: Needs TCTS For VATS Could consider non-contrasted CT scan but will defer to TCTS.  Cont abx  PCCM will s/o. Call if needed    [name] Fischer, MD ; [facility]  [phone].  After 5:30 PM or weekends, call [phone]

## 2012-12-09 ENCOUNTER — Inpatient Hospital Stay (HOSPITAL_COMMUNITY): Payer: 59

## 2012-12-09 DIAGNOSIS — R0602 Shortness of breath: Secondary | ICD-10-CM

## 2012-12-09 DIAGNOSIS — J90 Pleural effusion, not elsewhere classified: Secondary | ICD-10-CM

## 2012-12-09 LAB — TYPE AND SCREEN
ABO/RH(D): O POS
Antibody Screen: NEGATIVE
Donor AG Type: NEGATIVE
Donor AG Type: NEGATIVE
Unit division: 0
Unit division: 0

## 2012-12-09 LAB — COMPREHENSIVE METABOLIC PANEL
ALT: 24 U/L (ref 0–53)
AST: 23 U/L (ref 0–37)
Albumin: 1.9 g/dL — ABNORMAL LOW (ref 3.5–5.2)
Alkaline Phosphatase: 46 U/L (ref 39–117)
BUN: 9 mg/dL (ref 6–23)
CO2: 25 mEq/L (ref 19–32)
Calcium: 8.6 mg/dL (ref 8.4–10.5)
Chloride: 99 mEq/L (ref 96–112)
Creatinine, Ser: 0.52 mg/dL (ref 0.50–1.35)
GFR calc Af Amer: >90 mL/min (ref >90)
GFR calc non Af Amer: >90 mL/min (ref >90)
Glucose, Bld: 85 mg/dL (ref 70–99)
Potassium: 3.4 mEq/L — ABNORMAL LOW (ref 3.5–5.1)
Sodium: 133 mEq/L — ABNORMAL LOW (ref 135–145)
Total Bilirubin: 0.5 mg/dL (ref 0.3–1.2)
Total Protein: 9.6 g/dL — ABNORMAL HIGH (ref 6.0–8.3)

## 2012-12-09 LAB — PROTIME-INR
INR: 1.5 — ABNORMAL HIGH (ref 0.00–1.49)
INR: 1.46 (ref 0.00–1.49)
Prothrombin Time: 17.7 seconds — ABNORMAL HIGH (ref 11.6–15.2)
Prothrombin Time: 17.3 seconds — ABNORMAL HIGH (ref 11.6–15.2)

## 2012-12-09 LAB — CBC
HCT: 25.3 % — ABNORMAL LOW (ref 39.0–52.0)
Hemoglobin: 8.8 g/dL — ABNORMAL LOW (ref 13.0–17.0)
Hemoglobin: 8.6 g/dL — ABNORMAL LOW (ref 13.0–17.0)
MCH: 33.2 pg (ref 26.0–34.0)
MCHC: 34 g/dL (ref 30.0–36.0)
MCV: 97.7 fL (ref 78.0–100.0)
Platelets: 216 10*3/uL (ref 150–400)
RBC: 2.75 MIL/uL — ABNORMAL LOW (ref 4.22–5.81)
RBC: 2.59 MIL/uL — ABNORMAL LOW (ref 4.22–5.81)
RDW: 18.6 % — ABNORMAL HIGH (ref 11.5–15.5)
WBC: 6.4 10*3/uL (ref 4.0–10.5)

## 2012-12-09 LAB — PREPARE RBC (CROSSMATCH)

## 2012-12-09 LAB — APTT: aPTT: 42 seconds — ABNORMAL HIGH (ref 24–37)

## 2012-12-09 LAB — BASIC METABOLIC PANEL
CO2: 27 mEq/L (ref 19–32)
Chloride: 99 mEq/L (ref 96–112)
Glucose, Bld: 92 mg/dL (ref 70–99)
Potassium: 3.5 mEq/L (ref 3.5–5.1)
Sodium: 132 mEq/L — ABNORMAL LOW (ref 135–145)

## 2012-12-09 MED ORDER — ACETAMINOPHEN 500 MG PO TABS
1000.0000 mg | ORAL_TABLET | Freq: Four times a day (QID) | ORAL | Status: DC | PRN
  Filled 2012-12-09: qty 2

## 2012-12-09 MED ORDER — ENOXAPARIN SODIUM 60 MG/0.6ML ~~LOC~~ SOLN
50.0000 mg | SUBCUTANEOUS | Status: DC
  Administered 2012-12-10: 50 mg via SUBCUTANEOUS
  Filled 2012-12-09 (×3): qty 0.6

## 2012-12-09 MED ORDER — LEVOFLOXACIN IN D5W 750 MG/150ML IV SOLN: 750.0000 mg | INTRAVENOUS | Status: DC

## 2012-12-09 MED ORDER — IPRATROPIUM BROMIDE 0.02 % IN SOLN: 0.5000 mg | RESPIRATORY_TRACT | Status: DC | PRN

## 2012-12-09 MED ORDER — ALBUTEROL SULFATE (5 MG/ML) 0.5% IN NEBU: 2.5000 mg | INHALATION_SOLUTION | RESPIRATORY_TRACT | Status: DC | PRN

## 2012-12-09 MED ORDER — SODIUM CHLORIDE 0.9 % IV SOLN
INTRAVENOUS | Status: DC
  Administered 2012-12-09 – 2012-12-11 (×2): via INTRAVENOUS

## 2012-12-09 MED ORDER — LEVOFLOXACIN 750 MG PO TABS
750.0000 mg | ORAL_TABLET | Freq: Every day | ORAL | Status: DC
  Administered 2012-12-10: 750 mg via ORAL
  Filled 2012-12-09 (×3): qty 1

## 2012-12-09 MED ORDER — DEXTROSE 5 % IV SOLN
1.5000 g | INTRAVENOUS | Status: AC
  Administered 2012-12-11: 1.5 g via INTRAVENOUS
  Filled 2012-12-09: qty 1.5

## 2012-12-09 NOTE — Progress Notes (Signed)
Patient transferred from [facility] on 1/22, for VATS by Dr. Donata Clay on 1/23 or 1/24. Discussed with Dr. Elisabeth Pigeon and chart briefly reviewed. He denies new complaints. Intermittent mildly productive cough, DOE. Appears comfortable, lying in bed. Vitas stable. Reduced breath sounds right lower half of lung fields but no increased WOB.  Crysten Kaman 5:59 PM

## 2012-12-09 NOTE — Consult Note (Signed)
[address]            [place] [address]          [phone]       MANSEL STROTHER [facility] Medical Record #[id number] Date of Birth: Aug 14, 1961  No ref. provider found [name], MD  Chief Complaint:    Chief Complaint  Patient presents with  . Flank Pain    History of Present Illness:     52 y/o male hx myeloma on chronic chemo admitted a week ago with RLL pneumonia and subsequently developed a large R pleural effusion- thoracentesis only removed 150cc yellow fluid culture negative without improvement on CXR. Patient with SOB and mild leukocytosis. VATS recommended to remove loculated effusion- empyema   Current Activity/ Functional Status: Lives at home with wife   Past Medical History  Diagnosis Date  . Multiple myeloma(203.0) 09/24/2011  . Hypertension   chronic coumadin therapy for hypercoagulable state  History reviewed. No pertinent past surgical history.  History  Smoking status  . Never Smoker   Smokeless tobacco  . Never Used    History  Alcohol Use No    History   Social History  . Marital Status: Married    Spouse Name: N/A    Number of Children: N/A  . Years of Education: N/A   Occupational History  . Not on file.   Social History Main Topics  . Smoking status: Never Smoker   . Smokeless tobacco: Never Used  . Alcohol Use: No  . Drug Use: No  . Sexually Active: No   Other Topics Concern  . Not on file   Social History Narrative  . No narrative on file    Allergies  Allergen Reactions  . Red Dye Anaphylaxis    Lips swollen  3-4 times their baseline size  . Other Other (See Comments)    Strawberries "anything containing red dye"    Current Facility-Administered Medications  Medication Dose Route Frequency Provider Last Rate Last Dose  . 0.9 %  sodium chloride infusion   Intravenous Continuous Alison Murray, MD      . acetaminophen (TYLENOL) tablet 1,000 mg  1,000 mg Oral Q6H  PRN Alison Murray, MD      . albuterol (PROVENTIL) (5 MG/ML) 0.5% nebulizer solution 2.5 mg  2.5 mg Nebulization Q4H PRN Alison Murray, MD      . benzonatate (TESSALON) capsule 200 mg  200 mg Oral TID Zannie Cove, MD   200 mg at 12/08/12 2115  . ceFEPIme (MAXIPIME) 2 g in dextrose 5 % 50 mL IVPB  2 g Intravenous Q8H Zannie Cove, MD   2 g at 12/09/12 2130  . chlorpheniramine-HYDROcodone (TUSSIONEX) 10-8 MG/5ML suspension 5 mL  5 mL Oral Q12H PRN Zannie Cove, MD   5 mL at 12/09/12 0325  . enoxaparin (LOVENOX) injection 40 mg  40 mg Subcutaneous Q24H Zannie Cove, MD   40 mg at 12/08/12 1232  . gabapentin (NEURONTIN) capsule 300 mg  300 mg Oral TID Zannie Cove, MD   300 mg at 12/08/12 2115  . guaifenesin (ROBITUSSIN) 100 MG/5ML syrup 200 mg  200 mg Oral Q4H PRN Zannie Cove, MD      . HYDROcodone-acetaminophen [facility]) 10-325 MG per tablet 2 tablet  2 tablet Oral Q4H PRN Zannie Cove, MD   2 tablet at 12/09/12 0325  .  ipratropium (ATROVENT) nebulizer solution 0.5 mg  0.5 mg Nebulization Q4H PRN Alison Murray, MD      . levalbuterol Pauline Aus) nebulizer solution 0.63 mg  0.63 mg Nebulization Q6H Zannie Cove, MD   0.63 mg at 12/09/12 0217  . levofloxacin (LEVAQUIN) tablet 500 mg  500 mg Oral Daily Zannie Cove, MD   500 mg at 12/08/12 1037  . morphine 4 MG/ML injection 4 mg  4 mg Intravenous Q1H PRN Jinger Neighbors, NP   4 mg at 12/09/12 1610     Family History  Problem Relation Age of Onset  . Diabetic kidney disease Mother   . Hypertension Mother   . Hypertension Father   . Kidney failure Father      Review of Systems:     Cardiac Review of Systems: Y or N  Chest Pain [  n  ]  Resting SOB [ y  ] Exertional SOB  Cove.Etienne  ]  Orthopnea Cove.Etienne  ]   Pedal Edema Milo.Brash   ]    Palpitations Milo.Brash  ] Syncope  Milo.Brash  ]   Presyncope Milo.Brash   ]  General Review of Systems: [Y] = yes [  ]=no Constitional: recent weight change [ y ]; anorexia Cove.Etienne  ]; fatigue [  ]; nausea [  ]; night sweats [  ]; fever [  ];  or chills [  ];                                                                                                                                          Dental: poor dentition[  ]; Last Dentist visit: 1 year  Eye : blurred vision [  ]; diplopia [   ]; vision changes [  ];  Amaurosis fugax[  ]; Resp: cough [  ];  wheezing[  ];  hemoptysis[  ]; shortness of breath[  ]; paroxysmal nocturnal dyspnea[  ]; dyspnea on exertion[ y ]; or orthopnea[  ];  GI:  gallstones[  ], vomiting[  ];  dysphagia[  ]; melena[  ];  hematochezia [  ]; heartburn[  ];   Hx of  Colonoscopy[  ]; GU: kidney stones [  ]; hematuria[  ];   dysuria [  ];  nocturia[  ];  history of     obstruction [  ];                 Skin: rash, swelling[  ];, hair loss[  ];  peripheral edema[  ];  or itching[  ]; Musculosketetal: myalgias[  ];  joint swelling[  ];  joint erythema[  ];  joint pain[  ];  back pain[y  ];  Heme/Lymph: bruising[  ];  bleeding[  ];  anemia[  ];  Neuro: TIA[  ];  headaches[  ];  stroke[  ];  vertigo[  ];  seizures[  ];  paresthesias[  ];  difficulty walking[  ];  Psych:depression[  ]; anxiety[  ];  Endocrine: diabetes[  ];  thyroid dysfunction[  ];  Immunizations: Flu [  ]; Pneumococcal[  ];  Other:  Physical Exam: BP 143/89  Pulse 98  Temp 98.6 F (37 C) (Oral)  Resp 24  Ht 5\' 3"  (1.6 m)  Wt 226 lb 10.1 oz (102.8 kg)  BMI 40.15 kg/m2  SpO2 98%  EXAM  Chronically ill No JVD or cervical adenopathy Lungs - decreased breath sounds on R Cor nsr no murmur Abd- obese non tender extrem- warm, non tender Skin- no rashes Neuro no motor deficit   Diagnostic Studies & Laboratory data:   Al xrays and CT reviewed, microbiology data reviewed  Recent Radiology Findings:   Dg Chest 1 View  12/07/2012  *RADIOLOGY REPORT*  Clinical Data: Status post right thoracentesis.  CHEST - 1 VIEW  Comparison: Plain film of the chest 12/05/2012 and CT chest 12/01/2012.  Findings: Loculated appearing right pleural effusion  appears decreased in size after thoracentesis.  No pneumothorax is identified.  Left lung is clear.  Heart size is upper normal.  IMPRESSION: Decreased right pleural effusion after thoracentesis.  No pneumothorax.   Original Report Authenticated By: Holley Dexter, M.D.    US Thoracentesis Asp Pleural Space W/img Guide  12/07/2012  *RADIOLOGY REPORT*  Clinical Data:  Pneumonia, right-sided parapneumonic effusion  ULTRASOUND GUIDED right THORACENTESIS  Comparison:  None  An ultrasound guided thoracentesis was thoroughly discussed with the patient and questions answered.  The benefits, risks, alternatives and complications were also discussed.  The patient understands and wishes to proceed with the procedure.  Written consent was obtained.  Ultrasound was performed to localize and mark an adequate pocket of fluid in the right chest.  A small, loculated effusion was identified and the largest area was targeted for thoracentesis. The area was then prepped and draped in the normal sterile fashion.  1% Lidocaine was used for local anesthesia.  Under ultrasound guidance a 19 gauge Yueh catheter was introduced.  Thoracentesis was performed.  The catheter was removed and a dressing applied.  Complications:  None immediate  Findings: A total of approximately 150 ml of hazy amber colored fluid was removed. A fluid sample was sent for laboratory analysis.  IMPRESSION: Successful ultrasound guided right thoracentesis of small loculated effusion, yielding 150 ml of pleural fluid.  Read by Brayton El PA-C   Original Report Authenticated By: Irish Lack, M.D.       Recent Lab Findings:     Assessment / Plan:      Loculated R pleural effusion most likely parapneumonic or empyema needs drainage- will need to transfer patient to [facility] for Surgery either Thur or Fri. Procedure d/w patient and family.

## 2012-12-09 NOTE — Progress Notes (Signed)
Patient to be transported to [facility], room 6N30C-01.  Report called to Shanon Brow, RN.  Patient and his wife Inetta Fermo aware of transport.  [facility] notified for transport.  Allayne Butcher [facility]  12/09/2012  2:18 PM

## 2012-12-09 NOTE — Progress Notes (Signed)
[facility] PROGRESS NOTE  [name] ZOX:[id number] DOB: 13-Aug-1961 DOA: 12/01/2012 PCP: [name], MD  Brief narrative: 52 year old pleasant gentleman with history of multiple myeloma, on chemotherapy (under Dr. 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 In ED, evaluation included CT chest which revealed bilateral pneumonia and no evidence of pulmonary embolism. Hospital course is complicated with the development of right-sided parapneumonic effusion and possible empyema. Patient had thoracentesis done 12/07/2012 with 150 cc fluid drained Cardiothoracic surgery is assisting care. Due to loculated right-sided parapneumonic effusion the plan is for transfer to [place] today for VATS Thursday or Friday.  Assessment/Plan:   Principal problem *Right-sided parapneumonic effusion  Status post thoracentesis 12/07/2012 with 150 cc pleural fluid drained. Oral fluid culture shows no growth today. Pleural fluid/serum protein ratio is 0.8 and LDH ratio of 0.6 suggestive of exudative etiology  PCCM consulted and recommendation was to obtain cardiothoracic surgery for VATS and continue antibiotics  Patient will be transferred to [facility] today for VATS. Patient is stable in regards to respiratory and cardiovascular system.  Appreciate cardiothoracic surgery following  Active problems Healthcare associated pneumonia  Patient was initially on Levaquin but due to development of right parapneumonic effusion antibiotic coverage was broadened to vancomycin and cefepime in addition to Levaquin.  Blood cultures to date show no growth. Patient had thoracentesis with pleural fluid culture analysis which also showed no growth.  We will continue Xopenex every 6 hours schedule and Albuterol and Atrovent every 4 hours as needed  Continue  Robitussin and 2 snacks as needed Multiple myeloma  on Salvage Chemo, Dr. Arbutus Ped aware of patient's admission  Chemo on hold   Current analgesia is with hydrocodone 2 tablets every 4 hours as needed for moderate pain and morphine 4 mg IV every hour as needed for severe pain Vasculitis/ skin thrombosis  Coumadin on hold, use Lovenox for prophylaxis Hyponatremia  Possibly secondary to dehydration versus SIADH from malignancy and pleural effusion  Stable at 132 Anemia of chronic disease  Secondary to multiple myeloma, sequela of chemotherapy  Hemoglobin stable at 8.8  No signs of active bleed  Code Status: Full code Family Communication: Family at bedside Disposition Plan: Transfer to  today 12/09/2012  [name], MD  [facility] Pager [phone]  If 7PM-7AM, please contact night-coverage [email or web address] Password [facility] 12/09/2012, 7:37 AM   LOS: 8 days   Consultants:  IR for thoracentesis  PCCM  Cardiothoracic surgery  Procedures:  Thoracentesis 1/20  Antibiotics:  Vancomycin 12/05/2012 -->  Cefepime 12/05/2012 -->  Levaquin 12/05/2012 -->  HPI/Subjective: No acute overnight events.  Objective: Filed Vitals:   12/08/12 2215 12/08/12 2325 12/09/12 0217 12/09/12 0515  BP: 128/66 148/81  143/89  Pulse: 96 82  98  Temp: 98.3 F (36.8 C) 98.2 F (36.8 C)  98.6 F (37 C)  TempSrc: Oral Oral  Oral  Resp: 20 28  24   Height:      Weight:      SpO2:  100% 97% 98%    Intake/Output Summary (Last 24 hours) at 12/09/12 0737 Last data filed at 12/08/12 2300  Gross per 24 hour  Intake 1927.5 ml  Output    600 ml  Net 1327.5 ml    Exam:   General:  Pt is alert, follows commands appropriately, not in acute distress  Cardiovascular: Regular rate and rhythm, S1/S2, no murmurs, no rubs, no gallops  Respiratory: Diminished breath sounds throughout, right more than left  Abdomen: Soft, non tender, non distended, bowel sounds present, no  guarding  Extremities: No edema, pulses DP and PT palpable bilaterally  Neuro: Grossly nonfocal  Data Reviewed: Basic Metabolic Panel:  Lab 12/09/12 1610 12/08/12 0356 12/07/12 0752 12/06/12 0413 12/04/12 0400  NA 132* 133* 132* 128* 133*  K 3.5 3.3* 3.2* 3.5 3.4*  CL 99 98 94* 96 101  CO2 27 25 26 22 24   GLUCOSE 92 94 107* 89 118*  BUN 9 11 14 19 17   CREATININE 0.59 0.54 0.58 0.64 0.64  CALCIUM 9.0 8.8 9.1 9.0 9.0   Liver Function Tests:  Lab 12/07/12 0752  PROT 9.3*  ALBUMIN 2.4*   CBC:  Lab 12/09/12 0405 12/08/12 0356 12/06/12 0413 12/04/12 0400  WBC 6.5 6.9 8.6 9.1  HGB 8.8* 7.5* 7.9* 8.5*  HCT 27.3* 22.6* 24.0* 25.1*  MCV 99.3 99.6 98.8 98.4  PLT 268 256 261 265    CULTURE, BLOOD (ROUTINE X 2)     Status: Normal   Collection Time   12/01/12  8:55 PM      Component Value Range Status Comment   Culture NO GROWTH 5 DAYS   Final    Report Status 12/08/2012 FINAL   Final   CULTURE, BLOOD (ROUTINE X 2)     Status: Normal   Collection Time   12/01/12  9:02 PM      Component Value Range Status Comment   Culture NO GROWTH 5 DAYS   Final    Report Status 12/08/2012 FINAL   Final   MRSA PCR SCREENING     Status: Abnormal   Collection Time   12/02/12 10:55 AM      Component Value Range Status Comment   MRSA by PCR POSITIVE (*) NEGATIVE Final   CULTURE, RESPIRATORY     Status: Normal   Culture NORMAL OROPHARYNGEAL FLORA   Final    Report Status 12/08/2012 FINAL   Final   BODY FLUID CULTURE     Status: Normal (Preliminary result)   Collection Time   12/07/12  2:15 PM      Component Value Range Status Comment   Specimen Description PLEURAL   Final    Culture NO GROWTH   Final    Report Status PENDING   Incomplete      Studies: Dg Chest 1 View 12/07/2012  *IMPRESSION: Decreased right pleural effusion after thoracentesis.  No pneumothorax.   Original Report Authenticated By: Holley Dexter, M.D.    US Thoracentesis Asp Pleural Space W/img Guide 12/07/2012    IMPRESSION: Successful ultrasound guided right thoracentesis of small loculated effusion, yielding 150 ml of pleural fluid.  Read by Brayton El PA-C   Original Report Authenticated By: Irish Lack, M.D.     Scheduled Meds:  . benzonatate  200 mg Oral TID  . ceFEPime (MAXIPIME) IV  2 g Intravenous Q8H  . enoxaparin (LOVENOX)   40 mg Subcutaneous Q24H  . gabapentin  300 mg Oral TID  . levalbuterol  0.63 mg Nebulization Q6H  . levofloxacin  500 mg Oral Daily

## 2012-12-09 NOTE — Progress Notes (Signed)
Patient moved to stretcher by [facility] team and he was experiencing an increase in his right back pain.  Morphine 4 mg IV given prior to transport.  Notified Liborio Nixon, RN at [facility] of updates.  Allayne Butcher [facility]  12/09/2012  2:35 PM

## 2012-12-10 ENCOUNTER — Ambulatory Visit: Payer: 59

## 2012-12-10 ENCOUNTER — Inpatient Hospital Stay (HOSPITAL_COMMUNITY): Payer: 59

## 2012-12-10 ENCOUNTER — Other Ambulatory Visit: Payer: 59

## 2012-12-10 LAB — URINALYSIS, ROUTINE W REFLEX MICROSCOPIC
Bilirubin Urine: NEGATIVE
Glucose, UA: NEGATIVE mg/dL
Hgb urine dipstick: NEGATIVE
Ketones, ur: 15 mg/dL — AB
Leukocytes, UA: NEGATIVE
Nitrite: NEGATIVE
Protein, ur: 100 mg/dL — AB
Specific Gravity, Urine: 1.038 — ABNORMAL HIGH (ref 1.005–1.030)
Urobilinogen, UA: 0.2 mg/dL (ref 0.0–1.0)
pH: 6 (ref 5.0–8.0)

## 2012-12-10 LAB — CBC
HCT: 26.5 % — ABNORMAL LOW (ref 39.0–52.0)
Hemoglobin: 9.1 g/dL — ABNORMAL LOW (ref 13.0–17.0)
MCV: 98.1 fL (ref 78.0–100.0)
WBC: 6.3 10*3/uL (ref 4.0–10.5)

## 2012-12-10 LAB — BASIC METABOLIC PANEL
BUN: 9 mg/dL (ref 6–23)
Chloride: 100 mEq/L (ref 96–112)
Creatinine, Ser: 0.53 mg/dL (ref 0.50–1.35)
Glucose, Bld: 97 mg/dL (ref 70–99)
Potassium: 3.2 mEq/L — ABNORMAL LOW (ref 3.5–5.1)

## 2012-12-10 LAB — PREPARE RBC (CROSSMATCH)

## 2012-12-10 LAB — URINE MICROSCOPIC-ADD ON

## 2012-12-10 MED ORDER — LEVALBUTEROL HCL 0.63 MG/3ML IN NEBU
0.6300 mg | INHALATION_SOLUTION | Freq: Four times a day (QID) | RESPIRATORY_TRACT | Status: DC | PRN
  Filled 2012-12-10: qty 3

## 2012-12-10 MED ORDER — POTASSIUM CHLORIDE CRYS ER 20 MEQ PO TBCR
40.0000 meq | EXTENDED_RELEASE_TABLET | Freq: Once | ORAL | Status: AC
  Administered 2012-12-10: 40 meq via ORAL
  Filled 2012-12-10 (×2): qty 2

## 2012-12-10 NOTE — Progress Notes (Signed)
[facility] PROGRESS NOTE  [name] YQM:[id number] DOB: 1961-01-02 DOA: 12/01/2012 PCP: [name], MD  Brief narrative: 52 year old pleasant gentleman with history of multiple myeloma, on chemotherapy (under Dr. Asa Lente care), status post stem cell transplant and on chronic immunosuppressive medications, vasculitis with skin thrombosis (on Coumadin) who presented to [facility] 12/01/2012 with right-sided chest pain. In ED, evaluation included CT chest which revealed bilateral pneumonia and no evidence of pulmonary embolism. Hospital course is complicated by development of right-sided parapneumonic effusion and possible empyema. Patient had thoracentesis done 12/07/2012 with 150 cc fluid drained. Cardiothoracic surgery is assisting care. Due to loculated right-sided parapneumonic effusion, patient was transferred to [facility] per CTS recommendation, for VATS.  Assessment/Plan:   Principal problem *Loculated Right-sided pleural effusion- likely parapneumonic effusion, R/O Empyema  Status post thoracentesis 12/07/2012 with 150 cc pleural fluid drained. Fluid culture shows no growth today. Pleural fluid/serum protein ratio is 0.8 and LDH ratio of 0.6 suggestive of exudative etiology  PCCM consulted and signed off.  Appreciate cardiothoracic surgery input. Plan for VATS on 1/24 @ 730 am.  Continue Antibiotics  Active problems Healthcare associated pneumonia  Patient was initially on Levaquin but due to development of right parapneumonic effusion antibiotic coverage was broadened to vancomycin and cefepime in addition to Levaquin.  Blood cultures to date show no growth. Patient had thoracentesis with pleural fluid culture analysis which also showed no growth.  Continue IV Cefepime and PO Levofloxacin  Multiple myeloma  on Salvage Chemo, Dr. Arbutus Ped aware of patient's admission  Chemo on hold   Pain adequately controlled  Vasculitis/ skin thrombosis on chronic  Coumadin  Coumadin on hold, use Lovenox for prophylaxis  Hyponatremia  Possibly secondary to dehydration versus SIADH from malignancy and pleural effusion  Stable. ? SIADH. Looks clinically euvolemic.  Anemia of chronic disease  Secondary to multiple myeloma, sequela of chemotherapy  Hemoglobin stable. S/P 1 PRBC on 1/21  No signs of active bleed  Hypokalemia  Replete and follow BMP.  Code Status: Full code Family Communication: Discussed with spouse at bedside. Disposition Plan: Not medically ready for discharge.  [facility], MD  [facility] Pager [phone]  If 7PM-7AM, please contact night-coverage [email or web address] Password [email or web address] 12/10/2012, 1:04 PM   LOS: 9 days   Consultants:  IR for thoracentesis  PCCM  Cardiothoracic surgery  Procedures:  Thoracentesis 1/20  Antibiotics:  Vancomycin 12/05/2012 --> DC'd  Cefepime 12/05/2012 -->  Levaquin 12/05/2012 -->  HPI/Subjective: Denies any new complaints. Mild intermittent productive cough.  Objective: Filed Vitals:   12/09/12 2159 12/10/12 0224 12/10/12 0619 12/10/12 1008  BP: 148/78  139/76 115/61  Pulse: 91  86 87  Temp: 98.1 F (36.7 C)  98.5 F (36.9 C) 98.1 F (36.7 C)  TempSrc: Oral  Oral Oral  Resp: 21  18 20   Height:      Weight:      SpO2: 100% 99% 97% 100%    Intake/Output Summary (Last 24 hours) at 12/10/12 1304 Last data filed at 12/10/12 1000  Gross per 24 hour  Intake    319 ml  Output    450 ml  Net   -131 ml    Exam:   General:  Pt is alert, follows commands appropriately, not in acute distress  Cardiovascular: Regular rate and rhythm, S1/S2, no murmurs, no rubs, no gallops  Respiratory: Reduced breath sounds lower half of right lung fields. Rest of lungs clear to auscultation. No increased work of breathing. Able to speak in full sentences.  Abdomen:  Soft, non tender, non distended, bowel sounds present, no guarding  Extremities: No edema, pulses DP and PT palpable  bilaterally  Neuro: Grossly nonfocal  Data Reviewed: Basic Metabolic Panel:  Lab 12/09/12 1610 12/08/12 0356 12/07/12 0752 12/06/12 0413 12/04/12 0400  NA 132* 133* 132* 128* 133*  K 3.5 3.3* 3.2* 3.5 3.4*  CL 99 98 94* 96 101  CO2 27 25 26 22 24   GLUCOSE 92 94 107* 89 118*  BUN 9 11 14 19 17   CREATININE 0.59 0.54 0.58 0.64 0.64  CALCIUM 9.0 8.8 9.1 9.0 9.0   Liver Function Tests:  Lab 12/07/12 0752  PROT 9.3*  ALBUMIN 2.4*   CBC:  Lab 12/09/12 0405 12/08/12 0356 12/06/12 0413 12/04/12 0400  WBC 6.5 6.9 8.6 9.1  HGB 8.8* 7.5* 7.9* 8.5*  HCT 27.3* 22.6* 24.0* 25.1*  MCV 99.3 99.6 98.8 98.4  PLT 268 256 261 265    CULTURE, BLOOD (ROUTINE X 2)     Status: Normal   Collection Time   12/01/12  8:55 PM      Component Value Range Status Comment   Culture NO GROWTH 5 DAYS   Final    Report Status 12/08/2012 FINAL   Final   CULTURE, BLOOD (ROUTINE X 2)     Status: Normal   Collection Time   12/01/12  9:02 PM      Component Value Range Status Comment   Culture NO GROWTH 5 DAYS   Final    Report Status 12/08/2012 FINAL   Final   MRSA PCR SCREENING     Status: Abnormal   Collection Time   12/02/12 10:55 AM      Component Value Range Status Comment   MRSA by PCR POSITIVE (*) NEGATIVE Final   CULTURE, RESPIRATORY     Status: Normal   Culture NORMAL OROPHARYNGEAL FLORA   Final    Report Status 12/08/2012 FINAL   Final   BODY FLUID CULTURE     Status: Normal (Preliminary result)   Collection Time   12/07/12  2:15 PM      Component Value Range Status Comment   Specimen Description PLEURAL   Final    Culture NO GROWTH   Final    Report Status PENDING   Incomplete      Studies: Dg Chest 1 View 12/07/2012  *IMPRESSION: Decreased right pleural effusion after thoracentesis.  No pneumothorax.   Original Report Authenticated By: Holley Dexter, M.D.    US Thoracentesis Asp Pleural Space W/img Guide 12/07/2012   IMPRESSION: Successful ultrasound guided right thoracentesis of  small loculated effusion, yielding 150 ml of pleural fluid.  Read by Brayton El PA-C   Original Report Authenticated By: Irish Lack, M.D.     Scheduled Meds:  . benzonatate  200 mg Oral TID  . ceFEPime (MAXIPIME) IV  2 g Intravenous Q8H  . enoxaparin (LOVENOX)   40 mg Subcutaneous Q24H  . gabapentin  300 mg Oral TID  . levalbuterol  0.63 mg Nebulization Q6H  . levofloxacin  500 mg Oral Daily

## 2012-12-11 ENCOUNTER — Encounter (HOSPITAL_COMMUNITY): Payer: Self-pay | Admitting: Certified Registered"

## 2012-12-11 ENCOUNTER — Inpatient Hospital Stay (HOSPITAL_COMMUNITY): Payer: 59

## 2012-12-11 ENCOUNTER — Encounter (HOSPITAL_COMMUNITY)
Admission: EM | Disposition: A | Discharge status: Home Health | Payer: Self-pay | Source: Home / Self Care | Attending: Cardiothoracic Surgery

## 2012-12-11 ENCOUNTER — Inpatient Hospital Stay (HOSPITAL_COMMUNITY): Payer: 59 | Admitting: Certified Registered"

## 2012-12-11 DIAGNOSIS — J869 Pyothorax without fistula: Secondary | ICD-10-CM

## 2012-12-11 HISTORY — PX: VIDEO BRONCHOSCOPY: SHX5072

## 2012-12-11 HISTORY — PX: VIDEO ASSISTED THORACOSCOPY: SHX5073

## 2012-12-11 HISTORY — PX: DECORTICATION: SHX5101

## 2012-12-11 LAB — CBC
MCH: 32.8 pg (ref 26.0–34.0)
MCHC: 33 g/dL (ref 30.0–36.0)
Platelets: 221 10*3/uL (ref 150–400)
RDW: 18.2 % — ABNORMAL HIGH (ref 11.5–15.5)

## 2012-12-11 LAB — PROTIME-INR
INR: 1.41 (ref 0.00–1.49)
Prothrombin Time: 16.9 seconds — ABNORMAL HIGH (ref 11.6–15.2)

## 2012-12-11 LAB — BODY FLUID CULTURE

## 2012-12-11 SURGERY — BRONCHOSCOPY, VIDEO-ASSISTED
Anesthesia: General | Site: Chest | Laterality: Right | Wound class: Clean Contaminated

## 2012-12-11 MED ORDER — BISACODYL 5 MG PO TBEC
10.0000 mg | DELAYED_RELEASE_TABLET | Freq: Every day | ORAL | Status: DC
  Administered 2012-12-11 – 2012-12-12 (×2): 10 mg via ORAL
  Filled 2012-12-11 (×2): qty 2

## 2012-12-11 MED ORDER — OXYCODONE-ACETAMINOPHEN 5-325 MG PO TABS: 1.0000 | ORAL_TABLET | ORAL | Status: DC | PRN

## 2012-12-11 MED ORDER — VANCOMYCIN HCL IN DEXTROSE 1-5 GM/200ML-% IV SOLN
1000.0000 mg | Freq: Two times a day (BID) | INTRAVENOUS | Status: DC
  Filled 2012-12-11: qty 200

## 2012-12-11 MED ORDER — OXYCODONE HCL 5 MG PO TABS
5.0000 mg | ORAL_TABLET | ORAL | Status: AC | PRN
  Administered 2012-12-11 – 2012-12-12 (×3): 10 mg via ORAL
  Filled 2012-12-11 (×3): qty 2

## 2012-12-11 MED ORDER — SUCCINYLCHOLINE CHLORIDE 20 MG/ML IJ SOLN
INTRAMUSCULAR | Status: DC | PRN
  Administered 2012-12-11: 120 mg via INTRAVENOUS

## 2012-12-11 MED ORDER — ROCURONIUM BROMIDE 100 MG/10ML IV SOLN
INTRAVENOUS | Status: DC | PRN
  Administered 2012-12-11: 50 mg via INTRAVENOUS

## 2012-12-11 MED ORDER — DEXTROSE 5 % IV SOLN
2.0000 g | Freq: Two times a day (BID) | INTRAVENOUS | Status: DC
  Filled 2012-12-11: qty 2

## 2012-12-11 MED ORDER — SODIUM CHLORIDE 0.9 % IJ SOLN
9.0000 mL | INTRAMUSCULAR | Status: DC | PRN
  Administered 2012-12-14: 9 mL via INTRAVENOUS

## 2012-12-11 MED ORDER — ACETAMINOPHEN 10 MG/ML IV SOLN
1000.0000 mg | Freq: Four times a day (QID) | INTRAVENOUS | Status: DC
  Administered 2012-12-11: 1000 mg via INTRAVENOUS

## 2012-12-11 MED ORDER — OXYCODONE-ACETAMINOPHEN 5-325 MG PO TABS
1.0000 | ORAL_TABLET | ORAL | Status: DC | PRN
  Administered 2012-12-12 – 2012-12-20 (×19): 2 via ORAL
  Filled 2012-12-11 (×19): qty 2

## 2012-12-11 MED ORDER — MIDAZOLAM HCL 5 MG/5ML IJ SOLN
INTRAMUSCULAR | Status: DC | PRN
  Administered 2012-12-11: 2 mg via INTRAVENOUS

## 2012-12-11 MED ORDER — LACTATED RINGERS IV SOLN
INTRAVENOUS | Status: DC | PRN
  Administered 2012-12-11: 07:00:00 via INTRAVENOUS

## 2012-12-11 MED ORDER — PHENYLEPHRINE HCL 10 MG/ML IJ SOLN
INTRAMUSCULAR | Status: DC | PRN
  Administered 2012-12-11: 120 ug via INTRAVENOUS

## 2012-12-11 MED ORDER — NALOXONE HCL 0.4 MG/ML IJ SOLN: 0.4000 mg | INTRAMUSCULAR | Status: DC | PRN

## 2012-12-11 MED ORDER — 0.9 % SODIUM CHLORIDE (POUR BTL) OPTIME
TOPICAL | Status: DC | PRN
  Administered 2012-12-11: 1000 mL

## 2012-12-11 MED ORDER — ONDANSETRON HCL 4 MG/2ML IJ SOLN: 4.0000 mg | Freq: Four times a day (QID) | INTRAMUSCULAR | Status: DC | PRN

## 2012-12-11 MED ORDER — FENTANYL 10 MCG/ML IV SOLN
INTRAVENOUS | Status: DC
  Administered 2012-12-11: 18:00:00 via INTRAVENOUS
  Administered 2012-12-11: 39 ug/h via INTRAVENOUS
  Administered 2012-12-11 (×2): 30 ug via INTRAVENOUS
  Administered 2012-12-11: 150 ug via INTRAVENOUS
  Administered 2012-12-11: 334.9 ug via INTRAVENOUS
  Administered 2012-12-11: 30 ug via INTRAVENOUS
  Administered 2012-12-12: 107.3 ug via INTRAVENOUS
  Filled 2012-12-11 (×3): qty 50

## 2012-12-11 MED ORDER — ARTIFICIAL TEARS OP OINT
TOPICAL_OINTMENT | OPHTHALMIC | Status: DC | PRN
  Administered 2012-12-11: 1 via OPHTHALMIC

## 2012-12-11 MED ORDER — VECURONIUM BROMIDE 10 MG IV SOLR
INTRAVENOUS | Status: DC | PRN
  Administered 2012-12-11: 5 mg via INTRAVENOUS
  Administered 2012-12-11: 1 mg via INTRAVENOUS
  Administered 2012-12-11: 2 mg via INTRAVENOUS

## 2012-12-11 MED ORDER — KCL IN DEXTROSE-NACL 20-5-0.9 MEQ/L-%-% IV SOLN
INTRAVENOUS | Status: DC
  Administered 2012-12-11 – 2012-12-12 (×2): via INTRAVENOUS
  Filled 2012-12-11 (×3): qty 1000

## 2012-12-11 MED ORDER — ACETAMINOPHEN 10 MG/ML IV SOLN
INTRAVENOUS | Status: AC
  Administered 2012-12-12: 1000 mg via INTRAVENOUS
  Filled 2012-12-11: qty 100

## 2012-12-11 MED ORDER — HYDROMORPHONE HCL PF 1 MG/ML IJ SOLN
INTRAMUSCULAR | Status: AC
  Administered 2012-12-11: 0.5 mg via INTRAVENOUS
  Filled 2012-12-11: qty 2

## 2012-12-11 MED ORDER — OXYCODONE HCL 5 MG PO TABS: 5.0000 mg | ORAL_TABLET | ORAL | Status: DC | PRN

## 2012-12-11 MED ORDER — VANCOMYCIN HCL 10 G IV SOLR
Status: DC | PRN
  Administered 2012-12-11: 10:00:00

## 2012-12-11 MED ORDER — LIDOCAINE HCL (CARDIAC) 20 MG/ML IV SOLN
INTRAVENOUS | Status: DC | PRN
  Administered 2012-12-11: 50 mg via INTRAVENOUS

## 2012-12-11 MED ORDER — VANCOMYCIN HCL 1000 MG IV SOLR
INTRAVENOUS | Status: AC
  Filled 2012-12-11: qty 1000

## 2012-12-11 MED ORDER — VANCOMYCIN HCL IN DEXTROSE 1-5 GM/200ML-% IV SOLN
1000.0000 mg | Freq: Three times a day (TID) | INTRAVENOUS | Status: DC
  Administered 2012-12-11 – 2012-12-16 (×14): 1000 mg via INTRAVENOUS
  Filled 2012-12-11 (×18): qty 200

## 2012-12-11 MED ORDER — ONDANSETRON HCL 4 MG/2ML IJ SOLN
INTRAMUSCULAR | Status: DC | PRN
  Administered 2012-12-11: 4 mg via INTRAVENOUS

## 2012-12-11 MED ORDER — HYDROMORPHONE HCL PF 1 MG/ML IJ SOLN
0.2500 mg | INTRAMUSCULAR | Status: DC | PRN
  Administered 2012-12-11: 0.5 mg via INTRAVENOUS
  Administered 2012-12-11: 1 mg via INTRAVENOUS
  Administered 2012-12-11: 0.5 mg via INTRAVENOUS

## 2012-12-11 MED ORDER — PIPERACILLIN-TAZOBACTAM 3.375 G IVPB
3.3750 g | Freq: Three times a day (TID) | INTRAVENOUS | Status: DC
  Administered 2012-12-11 – 2012-12-17 (×18): 3.375 g via INTRAVENOUS
  Filled 2012-12-11 (×20): qty 50

## 2012-12-11 MED ORDER — DIPHENHYDRAMINE HCL 50 MG/ML IJ SOLN: 12.5000 mg | Freq: Four times a day (QID) | INTRAMUSCULAR | Status: DC | PRN

## 2012-12-11 MED ORDER — DIPHENHYDRAMINE HCL 12.5 MG/5ML PO ELIX
12.5000 mg | ORAL_SOLUTION | Freq: Four times a day (QID) | ORAL | Status: DC | PRN
  Filled 2012-12-11: qty 5

## 2012-12-11 MED ORDER — GLYCOPYRROLATE 0.2 MG/ML IJ SOLN
INTRAMUSCULAR | Status: DC | PRN
  Administered 2012-12-11: .8 mg via INTRAVENOUS

## 2012-12-11 MED ORDER — POTASSIUM CHLORIDE 10 MEQ/50ML IV SOLN
10.0000 meq | Freq: Every day | INTRAVENOUS | Status: DC | PRN
  Administered 2012-12-12 – 2012-12-14 (×8): 10 meq via INTRAVENOUS
  Filled 2012-12-11: qty 50
  Filled 2012-12-11 (×2): qty 150
  Filled 2012-12-11 (×2): qty 50

## 2012-12-11 MED ORDER — NEOSTIGMINE METHYLSULFATE 1 MG/ML IJ SOLN
INTRAMUSCULAR | Status: DC | PRN
  Administered 2012-12-11: 4 mg via INTRAVENOUS

## 2012-12-11 MED ORDER — FENTANYL CITRATE 0.05 MG/ML IJ SOLN
INTRAMUSCULAR | Status: DC | PRN
  Administered 2012-12-11 (×2): 50 ug via INTRAVENOUS
  Administered 2012-12-11: 100 ug via INTRAVENOUS
  Administered 2012-12-11: 50 ug via INTRAVENOUS
  Administered 2012-12-11: 100 ug via INTRAVENOUS
  Administered 2012-12-11: 150 ug via INTRAVENOUS

## 2012-12-11 MED ORDER — TRAMADOL HCL 50 MG PO TABS
50.0000 mg | ORAL_TABLET | Freq: Four times a day (QID) | ORAL | Status: DC | PRN
  Administered 2012-12-11 – 2012-12-12 (×2): 50 mg via ORAL
  Filled 2012-12-11 (×2): qty 1

## 2012-12-11 MED ORDER — ACETAMINOPHEN 10 MG/ML IV SOLN
1000.0000 mg | Freq: Four times a day (QID) | INTRAVENOUS | Status: AC
  Administered 2012-12-12 (×3): 1000 mg via INTRAVENOUS
  Filled 2012-12-11 (×4): qty 100

## 2012-12-11 MED ORDER — PROPOFOL 10 MG/ML IV BOLUS
INTRAVENOUS | Status: DC | PRN
  Administered 2012-12-11: 300 mg via INTRAVENOUS

## 2012-12-11 MED ORDER — SODIUM CHLORIDE 0.9 % IR SOLN
Status: DC | PRN
  Administered 2012-12-11: 1000 mL

## 2012-12-11 SURGICAL SUPPLY — 88 items
ADH SKN CLS APL DERMABOND .7 (GAUZE/BANDAGES/DRESSINGS) ×2
BAG DECANTER FOR FLEXI CONT (MISCELLANEOUS) IMPLANT
BALL CTTN LRG ABS STRL LF (GAUZE/BANDAGES/DRESSINGS)
BLADE SURG 11 STRL SS (BLADE) ×4 IMPLANT
BRUSH CYTOL CELLEBRITY 1.5X140 (MISCELLANEOUS) IMPLANT
CANISTER SUCTION 2500CC (MISCELLANEOUS) ×4 IMPLANT
CATH KIT ON Q 5IN SLV (PAIN MANAGEMENT) IMPLANT
CATH ROBINSON RED A/P 22FR (CATHETERS) IMPLANT
CATH THORACIC 28FR (CATHETERS) IMPLANT
CATH THORACIC 36FR (CATHETERS) ×2 IMPLANT
CATH THORACIC 36FR RT ANG (CATHETERS) ×2 IMPLANT
CLIP TI MEDIUM 24 (CLIP) ×2 IMPLANT
CLOTH BEACON ORANGE TIMEOUT ST (SAFETY) ×4 IMPLANT
CONN ST 1/4X3/8  BEN (MISCELLANEOUS) ×4
CONN ST 1/4X3/8 BEN (MISCELLANEOUS) IMPLANT
CONN Y 3/8X3/8X3/8  BEN (MISCELLANEOUS) ×2
CONN Y 3/8X3/8X3/8 BEN (MISCELLANEOUS) IMPLANT
CONT SPEC 4OZ CLIKSEAL STRL BL (MISCELLANEOUS) ×14 IMPLANT
COTTONBALL LRG STERILE PKG (GAUZE/BANDAGES/DRESSINGS) IMPLANT
COVER SURGICAL LIGHT HANDLE (MISCELLANEOUS) ×8 IMPLANT
COVER TABLE BACK 60X90 (DRAPES) ×4 IMPLANT
DERMABOND ADVANCED (GAUZE/BANDAGES/DRESSINGS) ×2
DERMABOND ADVANCED .7 DNX12 (GAUZE/BANDAGES/DRESSINGS) IMPLANT
DRAIN CHANNEL 32F RND 10.7 FF (WOUND CARE) ×2 IMPLANT
DRAPE LAPAROSCOPIC ABDOMINAL (DRAPES) ×4 IMPLANT
DRAPE WARM FLUID 44X44 (DRAPE) ×4 IMPLANT
ELECT REM PT RETURN 9FT ADLT (ELECTROSURGICAL) ×4
ELECTRODE REM PT RTRN 9FT ADLT (ELECTROSURGICAL) ×2 IMPLANT
FORCEPS BIOP RJ4 1.8 (CUTTING FORCEPS) IMPLANT
GLOVE BIO SURGEON STRL SZ 6 (GLOVE) ×2 IMPLANT
GLOVE BIO SURGEON STRL SZ 6.5 (GLOVE) ×2 IMPLANT
GLOVE BIO SURGEON STRL SZ7 (GLOVE) ×4 IMPLANT
GLOVE BIO SURGEON STRL SZ7.5 (GLOVE) ×8 IMPLANT
GLOVE BIO SURGEONS STRL SZ 6.5 (GLOVE) ×2
GOWN STRL NON-REIN LRG LVL3 (GOWN DISPOSABLE) ×14 IMPLANT
KIT BASIN OR (CUSTOM PROCEDURE TRAY) ×4 IMPLANT
KIT ROOM TURNOVER OR (KITS) ×4 IMPLANT
KIT SUCTION CATH 14FR (SUCTIONS) ×4 IMPLANT
MARKER SKIN DUAL TIP RULER LAB (MISCELLANEOUS) ×4 IMPLANT
NDL BIOPSY TRANSBRONCH 21G (NEEDLE) IMPLANT
NDL BLUNT 18X1 FOR OR ONLY (NEEDLE) IMPLANT
NEEDLE 22X1 1/2 (OR ONLY) (NEEDLE) IMPLANT
NEEDLE BIOPSY TRANSBRONCH 21G (NEEDLE) IMPLANT
NEEDLE BLUNT 18X1 FOR OR ONLY (NEEDLE) IMPLANT
NS IRRIG 1000ML POUR BTL (IV SOLUTION) ×10 IMPLANT
OIL SILICONE PENTAX (PARTS (SERVICE/REPAIRS)) IMPLANT
PACK CHEST (CUSTOM PROCEDURE TRAY) ×4 IMPLANT
PAD ARMBOARD 7.5X6 YLW CONV (MISCELLANEOUS) ×8 IMPLANT
SEALANT SURG COSEAL 4ML (VASCULAR PRODUCTS) IMPLANT
SOLUTION ANTI FOG 6CC (MISCELLANEOUS) ×4 IMPLANT
SPONGE GAUZE 4X4 12PLY (GAUZE/BANDAGES/DRESSINGS) ×4 IMPLANT
SPONGE TONSIL 1.25 RF SGL STRG (GAUZE/BANDAGES/DRESSINGS) ×6 IMPLANT
SUT CHROMIC 3 0 SH 27 (SUTURE) IMPLANT
SUT ETHILON 3 0 PS 1 (SUTURE) IMPLANT
SUT PROLENE 3 0 SH DA (SUTURE) IMPLANT
SUT PROLENE 4 0 RB 1 (SUTURE) ×4
SUT PROLENE 4-0 RB1 .5 CRCL 36 (SUTURE) IMPLANT
SUT SILK  1 MH (SUTURE) ×10
SUT SILK 1 MH (SUTURE) ×4 IMPLANT
SUT SILK 2 0SH CR/8 30 (SUTURE) IMPLANT
SUT SILK 3 0SH CR/8 30 (SUTURE) IMPLANT
SUT VIC AB 1 CTX 18 (SUTURE) ×6 IMPLANT
SUT VIC AB 1 CTX 36 (SUTURE) ×8
SUT VIC AB 1 CTX36XBRD ANBCTR (SUTURE) IMPLANT
SUT VIC AB 2 TP1 27 (SUTURE) IMPLANT
SUT VIC AB 2-0 CTX 36 (SUTURE) ×4 IMPLANT
SUT VIC AB 3-0 SH 8-18 (SUTURE) ×2 IMPLANT
SUT VIC AB 3-0 X1 27 (SUTURE) ×4 IMPLANT
SUT VICRYL 0 UR6 27IN ABS (SUTURE) IMPLANT
SUT VICRYL 2 TP 1 (SUTURE) ×2 IMPLANT
SWAB COLLECTION DEVICE MRSA (MISCELLANEOUS) IMPLANT
SYR 20ML ECCENTRIC (SYRINGE) ×4 IMPLANT
SYR 5ML LUER SLIP (SYRINGE) ×4 IMPLANT
SYR CONTROL 10ML LL (SYRINGE) IMPLANT
SYSTEM SAHARA CHEST DRAIN ATS (WOUND CARE) ×4 IMPLANT
TAPE CLOTH SURG 4X10 WHT LF (GAUZE/BANDAGES/DRESSINGS) ×2 IMPLANT
TIP APPLICATOR SPRAY EXTEND 16 (VASCULAR PRODUCTS) IMPLANT
TOWEL OR 17X24 6PK STRL BLUE (TOWEL DISPOSABLE) ×4 IMPLANT
TOWEL OR 17X26 10 PK STRL BLUE (TOWEL DISPOSABLE) ×8 IMPLANT
TRAP SPECIMEN MUCOUS 40CC (MISCELLANEOUS) ×4 IMPLANT
TRAY FOLEY CATH 14FR (SET/KITS/TRAYS/PACK) ×4 IMPLANT
TUBE ANAEROBIC SPECIMEN COL (MISCELLANEOUS) IMPLANT
TUBE CONNECTING 12'X1/4 (SUCTIONS) ×1
TUBE CONNECTING 12X1/4 (SUCTIONS) ×3 IMPLANT
VALVE BIOPSY  SINGLE USE (MISCELLANEOUS)
VALVE BIOPSY SINGLE USE (MISCELLANEOUS) IMPLANT
VALVE SUCTION BRONCHIO DISP (MISCELLANEOUS) IMPLANT
WATER STERILE IRR 1000ML POUR (IV SOLUTION) ×8 IMPLANT

## 2012-12-11 NOTE — Anesthesia Procedure Notes (Addendum)
Procedure Name: Intubation Date/Time: 12/11/2012 7:46 AM Performed by: Carmela Rima Pre-anesthesia Checklist: Patient identified, Timeout performed, Emergency Drugs available, Suction available and Patient being monitored Patient Re-evaluated:Patient Re-evaluated prior to inductionOxygen Delivery Method: Circle system utilized Preoxygenation: Pre-oxygenation with 100% oxygen Intubation Type: IV induction and Rapid sequence Ventilation: Mask ventilation without difficulty Grade View: Grade II Tube type: Oral Tube size: 8.5 mm Number of attempts: 2 Airway Equipment and Method: Video-laryngoscopy Placement Confirmation: ETT inserted through vocal cords under direct vision,  positive ETCO2 and breath sounds checked- equal and bilateral Secured at: 21 cm Tube secured with: Tape Dental Injury: Teeth and Oropharynx as per pre-operative assessment     Date/Time: 12/11/2012 8:20 AM Performed by: Bronson Ing F Endobronchial tube: Double lumen EBT, Left, EBT position confirmed by fiberoptic bronchoscope and EBT position confirmed by auscultation and 37 Fr Airway Equipment and Method: Video-laryngoscopy Placement Confirmation: breath sounds checked- equal and bilateral,  positive ETCO2 and ETT inserted through vocal cords under direct vision (ETT changed out using tube exchanger stylette) Tube secured with: Tape Dental Injury: Teeth and Oropharynx as per pre-operative assessment    RIJ cordis [id number]: The patient was identified and consent obtained.  TO was performed, and full barrier precautions were used.  The skin was anesthetized with lidocaine.  Once the vein was located with the 22 ga. needle using ultrasound guidance , the wire was inserted into the vein.  The wire location was confirmed with ultrasound.  The tissue was dilated and the catheter was carefully inserted, then sutured in place. A dressing was applied. The patient tolerated the procedure well.   CE

## 2012-12-11 NOTE — Progress Notes (Addendum)
Patient is OR since early this am (not seen by [facility] today). Post op patient going to ICU with [facility] as Primary Attending. If primary service needs assistance with medical management in ICU, please consult the PCCM service. [facility] will sign off. Please call [facility] for any further assistance.  Vernie Piet 3:42 PM 12/11/12 Pager: [phone].

## 2012-12-11 NOTE — Progress Notes (Signed)
Started taking ice chips

## 2012-12-11 NOTE — Transfer of Care (Signed)
Immediate Anesthesia Transfer of Care Note  Patient: [name]  Procedure(s) Performed: Procedure(s) (LRB) with comments: VIDEO BRONCHOSCOPY (N/A) VIDEO ASSISTED THORACOSCOPY (Right) DECORTICATION (N/A)  Patient Location: PACU  Anesthesia Type:General  Level of Consciousness: awake, alert  and oriented  Airway & Oxygen Therapy: Patient Spontanous Breathing and Patient connected to face mask oxygen  Post-op Assessment: Report given to PACU RN, Post -op Vital signs reviewed and stable and Patient moving all extremities X 4  Post vital signs: Reviewed and stable  Complications: No apparent anesthesia complications

## 2012-12-11 NOTE — Progress Notes (Signed)
ANTIBIOTIC CONSULT NOTE - Follow Up  Pharmacy Consult for vancomycin and zosyn Indication: s/p VATS for RLL PNA and R empyema  Allergies  Allergen Reactions  . Red Dye Anaphylaxis    Lips swollen  3-4 times their baseline size  . Other Other (See Comments)    Strawberries "anything containing red dye"   Patient Measurements: Height: 5\' 3"  (160 cm) Weight: 226 lb 10.1 oz (102.8 kg) IBW/kg (Calculated) : 56.9   Vital Signs: Temp: 97.6 F (36.4 C) (01/24 1500) BP: 144/72 mmHg (01/24 1800) Pulse Rate: 95  (01/24 1800) Intake/Output from previous day: 01/23 0701 - 01/24 0700 In: 1008.7 [P.O.:480; I.V.:478.7; IV Piggyback:50] Out: 450 [Urine:450] Intake/Output from this shift: Total I/O In: 2480 [P.O.:50; I.V.:1730; Blood:700] Out: 2290 [Urine:1750; Blood:200; Chest Tube:340]  Labs:  [facility] 12/11/12 0600 12/10/12 1350 12/09/12 1706 12/09/12 0405  WBC 6.0 6.3 6.4 --  HGB 8.7* 9.1* 8.6* --  PLT 221 222 216 --  LABCREA -- -- -- --  CREATININE -- 0.53 0.52 0.59   Estimated Creatinine Clearance: 117.7 ml/min (by C-G formula based on Cr of 0.53). No results found for this basename: VANCOTROUGH:2,VANCOPEAK:2,VANCORANDOM:2,GENTTROUGH:2,GENTPEAK:2,GENTRANDOM:2,TOBRATROUGH:2,TOBRAPEAK:2,TOBRARND:2,AMIKACINPEAK:2,AMIKACINTROU:2,AMIKACIN:2, in the last 72 hours   Microbiology: Recent Results (from the past 720 hour(s))  CULTURE, BLOOD (ROUTINE X 2)     Status: Normal   Collection Time   12/01/12  8:55 PM      Component Value Range Status Comment   Specimen Description BLOOD LEFT HAND   Final    Special Requests BOTTLES DRAWN AEROBIC AND ANAEROBIC 5CC   Final    Culture  Setup Time 12/02/2012 02:22   Final    Culture NO GROWTH 5 DAYS   Final    Report Status 12/08/2012 FINAL   Final   CULTURE, BLOOD (ROUTINE X 2)     Status: Normal   Collection Time   12/01/12  9:02 PM      Component Value Range Status Comment   Specimen Description BLOOD RIGHT HAND   Final    Special Requests  BOTTLES DRAWN AEROBIC AND ANAEROBIC 5CC   Final    Culture  Setup Time 12/02/2012 02:22   Final    Culture NO GROWTH 5 DAYS   Final    Report Status 12/08/2012 FINAL   Final   MRSA PCR SCREENING     Status: Abnormal   Collection Time   12/02/12 10:55 AM      Component Value Range Status Comment   MRSA by PCR POSITIVE (*) NEGATIVE Final   CULTURE, EXPECTORATED SPUTUM-ASSESSMENT     Status: Normal   Collection Time   12/05/12  4:37 PM      Component Value Range Status Comment   Specimen Description SPUTUM   Final    Special Requests Immunocompromised   Final    Sputum evaluation     Final    Value: THIS SPECIMEN IS ACCEPTABLE. RESPIRATORY CULTURE REPORT TO FOLLOW.   Report Status 12/05/2012 FINAL   Final   CULTURE, RESPIRATORY     Status: Normal   Collection Time   12/05/12  4:37 PM      Component Value Range Status Comment   Specimen Description SPUTUM   Final    Special Requests NONE   Final    Gram Stain     Final    Value: RARE WBC PRESENT, PREDOMINANTLY MONONUCLEAR     RARE SQUAMOUS EPITHELIAL CELLS PRESENT     RARE GRAM POSITIVE COCCI     IN  PAIRS   Culture NORMAL OROPHARYNGEAL FLORA   Final    Report Status 12/08/2012 FINAL   Final   BODY FLUID CULTURE     Status: Normal   Collection Time   12/07/12  2:15 PM      Component Value Range Status Comment   Specimen Description PLEURAL   Final    Special Requests NONE   Final    Gram Stain     Final    Value: FEW WBC PRESENT, PREDOMINANTLY MONONUCLEAR     NO ORGANISMS SEEN   Culture NO GROWTH 3 DAYS   Final    Report Status 12/11/2012 FINAL   Final     Medical History: Past Medical History  Diagnosis Date  . Multiple myeloma(203.0) 09/24/2011  . Hypertension    Assessment:  52yo M with multiple myeloma admitted 1/14 with rib pain and pneumonia. S/p VATS today for RLL PNA and R empyema.    levaquin 1/14 - 1/23   Zosyn 1/15 - 1/17       Cefepime 1/18 - 1/23       zinacef x 1 dose 1/24       Vancomycin 1/15 - 1/21     Pre-op he was on levaquin and cefepime.  Post-op he will be on zosyn and vancomcyin.  His vancomycin trough was < 5 at [facility] on 1/21 on vancomycin 1 gm IV q12h. Wt  103 kg.  Creat cl excellent. Goal: vanc trough 15-20 mcg/ml  Plan:  1. Increase vancomycin to 1000 mg IV q8h 2. Continue zosyn 3.375 gm IV q8h, infuse each dose over 4 hours Herby Abraham, Pharm.D. [phone] 12/11/2012 6:56 PM

## 2012-12-11 NOTE — Progress Notes (Signed)
The patient was examined and preop studies reviewed. There has been no change from the prior exam and the patient is ready for surgery.  Plan R VATS and bronch on J Lupe

## 2012-12-11 NOTE — Progress Notes (Signed)
ANTIBIOTIC CONSULT NOTE - Follow Up  Pharmacy Consult for Cefepime Indication: PNA  Allergies  Allergen Reactions  . Red Dye Anaphylaxis    Lips swollen  3-4 times their baseline size  . Other Other (See Comments)    Strawberries "anything containing red dye"   Patient Measurements: Height: 5\' 3"  (160 cm) Weight: 226 lb 10.1 oz (102.8 kg) IBW/kg (Calculated) : 56.9   Vital Signs: Temp: 97.6 F (36.4 C) (01/24 1500) Temp src: Oral (01/24 0619) BP: 163/83 mmHg (01/24 1515) Pulse Rate: 105  (01/24 1515) Intake/Output from previous day: 01/23 0701 - 01/24 0700 In: 1008.7 [P.O.:480; I.V.:478.7; IV Piggyback:50] Out: 450 [Urine:450] Intake/Output from this shift: Total I/O In: 2400 [I.V.:1700; Blood:700] Out: 2030 [Urine:1550; Blood:200; Chest Tube:280]  Labs:  [facility] 12/11/12 0600 12/10/12 1350 12/09/12 1706 12/09/12 0405  WBC 6.0 6.3 6.4 --  HGB 8.7* 9.1* 8.6* --  PLT 221 222 216 --  LABCREA -- -- -- --  CREATININE -- 0.53 0.52 0.59   Estimated Creatinine Clearance: 117.7 ml/min (by C-G formula based on Cr of 0.53). No results found for this basename: VANCOTROUGH:2,VANCOPEAK:2,VANCORANDOM:2,GENTTROUGH:2,GENTPEAK:2,GENTRANDOM:2,TOBRATROUGH:2,TOBRAPEAK:2,TOBRARND:2,AMIKACINPEAK:2,AMIKACINTROU:2,AMIKACIN:2, in the last 72 hours   Microbiology: Recent Results (from the past 720 hour(s))  CULTURE, BLOOD (ROUTINE X 2)     Status: Normal   Collection Time   12/01/12  8:55 PM      Component Value Range Status Comment   Specimen Description BLOOD LEFT HAND   Final    Special Requests BOTTLES DRAWN AEROBIC AND ANAEROBIC 5CC   Final    Culture  Setup Time 12/02/2012 02:22   Final    Culture NO GROWTH 5 DAYS   Final    Report Status 12/08/2012 FINAL   Final   CULTURE, BLOOD (ROUTINE X 2)     Status: Normal   Collection Time   12/01/12  9:02 PM      Component Value Range Status Comment   Specimen Description BLOOD RIGHT HAND   Final    Special Requests BOTTLES DRAWN AEROBIC  AND ANAEROBIC 5CC   Final    Culture  Setup Time 12/02/2012 02:22   Final    Culture NO GROWTH 5 DAYS   Final    Report Status 12/08/2012 FINAL   Final   MRSA PCR SCREENING     Status: Abnormal   Collection Time   12/02/12 10:55 AM      Component Value Range Status Comment   MRSA by PCR POSITIVE (*) NEGATIVE Final   CULTURE, EXPECTORATED SPUTUM-ASSESSMENT     Status: Normal   Collection Time   12/05/12  4:37 PM      Component Value Range Status Comment   Specimen Description SPUTUM   Final    Special Requests Immunocompromised   Final    Sputum evaluation     Final    Value: THIS SPECIMEN IS ACCEPTABLE. RESPIRATORY CULTURE REPORT TO FOLLOW.   Report Status 12/05/2012 FINAL   Final   CULTURE, RESPIRATORY     Status: Normal   Collection Time   12/05/12  4:37 PM      Component Value Range Status Comment   Specimen Description SPUTUM   Final    Special Requests NONE   Final    Gram Stain     Final    Value: RARE WBC PRESENT, PREDOMINANTLY MONONUCLEAR     RARE SQUAMOUS EPITHELIAL CELLS PRESENT     RARE GRAM POSITIVE COCCI     IN PAIRS   Culture NORMAL  OROPHARYNGEAL FLORA   Final    Report Status 12/08/2012 FINAL   Final   BODY FLUID CULTURE     Status: Normal   Collection Time   12/07/12  2:15 PM      Component Value Range Status Comment   Specimen Description PLEURAL   Final    Special Requests NONE   Final    Gram Stain     Final    Value: FEW WBC PRESENT, PREDOMINANTLY MONONUCLEAR     NO ORGANISMS SEEN   Culture NO GROWTH 3 DAYS   Final    Report Status 12/11/2012 FINAL   Final     Medical History: Past Medical History  Diagnosis Date  . Multiple myeloma(203.0) 09/24/2011  . Hypertension    Medications:  Anti-infectives     Start     Dose/Rate Route Frequency Ordered Stop   12/11/12 1002   vancomycin 1500 mg in NS ( ) irrigation  Status:  Discontinued          As needed 12/11/12 1002 12/11/12 1054   12/11/12 0500   cefUROXime (ZINACEF) 1.5 g in dextrose 5 % 50 mL  IVPB        1.5 g 100 mL/hr over 30 Minutes Intravenous 60 min pre-op 12/09/12 1642 12/11/12 0805   12/10/12 0900   levofloxacin (LEVAQUIN) IVPB 750 mg  Status:  Discontinued        750 mg 100 mL/hr over 90 Minutes Intravenous Every 24 hours 12/09/12 1358 12/09/12 1359   12/10/12 0900   levofloxacin (LEVAQUIN) tablet 750 mg        750 mg Oral Daily 12/09/12 1359     12/08/12 1400   vancomycin (VANCOCIN) 1,500 mg in sodium chloride 0.9 % 500 mL IVPB  Status:  Discontinued        1,500 mg 250 mL/hr over 120 Minutes Intravenous Every 12 hours 12/08/12 0735 12/08/12 0813   12/05/12 2200   vancomycin (VANCOCIN) IVPB 1000 mg/200 mL premix  Status:  Discontinued        1,000 mg 200 mL/hr over 60 Minutes Intravenous Every 12 hours 12/05/12 1013 12/08/12 0735   12/05/12 1400   ceFEPIme (MAXIPIME) 2 g in dextrose 5 % 50 mL IVPB        2 g 100 mL/hr over 30 Minutes Intravenous 3 times per day 12/05/12 1013     12/05/12 1100   vancomycin (VANCOCIN) 2,000 mg in sodium chloride 0.9 % 500 mL IVPB        2,000 mg 250 mL/hr over 120 Minutes Intravenous  Once 12/05/12 1013 12/05/12 1559   12/04/12 1000   levofloxacin (LEVAQUIN) tablet 500 mg  Status:  Discontinued        500 mg Oral Daily 12/04/12 0734 12/09/12 1357   12/02/12 0600   piperacillin-tazobactam (ZOSYN) IVPB 3.375 g  Status:  Discontinued        3.375 g 12.5 mL/hr over 240 Minutes Intravenous 3 times per day 12/02/12 0114 12/04/12 0734   12/02/12 0115   piperacillin-tazobactam (ZOSYN) IVPB 3.375 g        3.375 g 100 mL/hr over 30 Minutes Intravenous  Once 12/02/12 0112 12/02/12 0150   12/02/12 0115   oseltamivir (TAMIFLU) capsule 75 mg  Status:  Discontinued        75 mg Oral 2 times daily 12/02/12 0113 12/03/12 0740   12/01/12 2330   levofloxacin (LEVAQUIN) IVPB 750 mg  Status:  Discontinued  750 mg 100 mL/hr over 90 Minutes Intravenous Every 24 hours 12/01/12 2317 12/04/12 0734   12/01/12 2100   levofloxacin (LEVAQUIN)  IVPB 750 mg        750 mg 100 mL/hr over 90 Minutes Intravenous  Once 12/01/12 2050 12/01/12 2250         Assessment:  52yo M with multiple myeloma admitted 1/14 with rib pain and pneumonia. Now on cefepime/levaquin to cover for PNA. S/p VATS today.   Levaquin D10  Cefepime D6  Plan:  Change cefepime to 2g IV q12 Levaquin 750mg  q24

## 2012-12-11 NOTE — OR Nursing (Signed)
08:08am - end time of 1st procedure, 2nd procedure start time @ 09:12am

## 2012-12-11 NOTE — Preoperative (Signed)
Beta Blockers   Reason not to administer Beta Blockers:Not Applicable 

## 2012-12-11 NOTE — Progress Notes (Signed)
Dr. Randa Evens called pt having increased pain asked if may have IVB tylenol DR. Edwards said to call surgeon Dr. Donata Clay called order received

## 2012-12-11 NOTE — Anesthesia Preprocedure Evaluation (Addendum)
Anesthesia Evaluation  Patient identified by MRN, date of birth, ID band Patient awake    Reviewed: Allergy & Precautions, H&P , NPO status , Patient's Chart, lab work & pertinent test results  Airway Mallampati: III TM Distance: >3 FB Neck ROM: limited  Mouth opening: Limited Mouth Opening  Dental  (+) Teeth Intact and Dental Advidsory Given   Pulmonary pneumonia -, unresolved,          Cardiovascular hypertension, On Medications     Neuro/Psych    GI/Hepatic   Endo/Other    Renal/GU      Musculoskeletal   Abdominal   Peds  Hematology   Anesthesia Other Findings   Reproductive/Obstetrics                           Anesthesia Physical Anesthesia Plan  ASA: III  Anesthesia Plan: General   Post-op Pain Management:    Induction: Intravenous  Airway Management Planned: Oral ETT  Additional Equipment:   Intra-op Plan:   Post-operative Plan: Extubation in OR  Informed Consent: I have reviewed the patients History and Physical, chart, labs and discussed the procedure including the risks, benefits and alternatives for the proposed anesthesia with the patient or authorized representative who has indicated his/her understanding and acceptance.   Dental Advisory Given  Plan Discussed with: Anesthesiologist, CRNA and Surgeon  Anesthesia Plan Comments:        Anesthesia Quick Evaluation

## 2012-12-11 NOTE — Brief Op Note (Signed)
12/01/2012 - 12/11/2012  10:29 AM  PATIENT:  [name]  52 y.o. male  PRE-OPERATIVE DIAGNOSIS:  1. Right lower lobe  Pneumonia 2.Right empyema  POST-OPERATIVE DIAGNOSIS:  1. Right lower lobe  Pneumonia 2.Right empyema    PROCEDURE:  VIDEO BRONCHOSCOPY,  RIGHT VIDEO ASSISTED THORACOSCOPY , RIGHT MINI THORACOTOMY, DECORTICATION   SURGEON:  Surgeon(s) and Role:    * Kerin Perna, MD - Primary  PHYSICIAN ASSISTANT: Doree Fudge PA-C   ANESTHESIA:   general  EBL:  Total I/O In: 2400 [I.V.:1700; Blood:700] Out: 1200 [Urine:1000; Blood:200]  BLOOD ADMINISTERED:One CC PRBC  DRAINS:Two 36 French  Chest Tube(s) in the right pleural space;One Bard Drain in the right pleural space   SPECIMEN:  Source of Specimen:  Right pleura and empyema  DISPOSITION OF SPECIMEN:  PATHOLOGY and Culture  COUNTS CORRECT:  YES  DICTATION: .Dragon Dictation  PLAN OF CARE: Admit to inpatient   PATIENT DISPOSITION:  ICU - extubated and stable.   Delay start of Pharmacological VTE agent (>24hrs) due to surgical blood loss or risk of bleeding: yes

## 2012-12-11 NOTE — Anesthesia Postprocedure Evaluation (Signed)
  Anesthesia Post-op Note  Patient: [name]  Procedure(s) Performed: Procedure(s) (LRB) with comments: VIDEO BRONCHOSCOPY (N/A) VIDEO ASSISTED THORACOSCOPY (Right) DECORTICATION (N/A)  Patient Location: PACU  Anesthesia Type:General  Level of Consciousness: awake  Airway and Oxygen Therapy: Patient Spontanous Breathing  Post-op Pain: mild  Post-op Assessment: Post-op Vital signs reviewed  Post-op Vital Signs: Reviewed  Complications: No apparent anesthesia complications

## 2012-12-12 ENCOUNTER — Inpatient Hospital Stay (HOSPITAL_COMMUNITY): Payer: 59

## 2012-12-12 LAB — POCT I-STAT 3, ART BLOOD GAS (G3+)
Patient temperature: 99.4
pCO2 arterial: 36.1 mmHg (ref 35.0–45.0)
pH, Arterial: 7.461 — ABNORMAL HIGH (ref 7.350–7.450)

## 2012-12-12 LAB — CBC
MCH: 32.2 pg (ref 26.0–34.0)
MCV: 96.1 fL (ref 78.0–100.0)
Platelets: 173 10*3/uL (ref 150–400)
RDW: 19.3 % — ABNORMAL HIGH (ref 11.5–15.5)
WBC: 5.1 10*3/uL (ref 4.0–10.5)

## 2012-12-12 LAB — BASIC METABOLIC PANEL
Calcium: 8.5 mg/dL (ref 8.4–10.5)
Creatinine, Ser: 0.44 mg/dL — ABNORMAL LOW (ref 0.50–1.35)
GFR calc Af Amer: >90 mL/min (ref >90)
Sodium: 136 mEq/L (ref 135–145)

## 2012-12-12 MED ORDER — KCL IN DEXTROSE-NACL 20-5-0.9 MEQ/L-%-% IV SOLN
INTRAVENOUS | Status: DC
  Administered 2012-12-14: via INTRAVENOUS
  Filled 2012-12-12 (×2): qty 1000

## 2012-12-12 MED ORDER — FENTANYL 10 MCG/ML IV SOLN
INTRAVENOUS | Status: DC
  Administered 2012-12-12: 19:00:00 via INTRAVENOUS
  Administered 2012-12-12: 105 ug via INTRAVENOUS
  Administered 2012-12-12: 145.6 ug via INTRAVENOUS
  Administered 2012-12-12: 75.07 ug via INTRAVENOUS
  Administered 2012-12-12: 216 ug via INTRAVENOUS
  Administered 2012-12-12: 10:00:00 via INTRAVENOUS
  Administered 2012-12-12: 241 ug via INTRAVENOUS
  Administered 2012-12-13: 92.15 ug via INTRAVENOUS
  Administered 2012-12-13: 169.9 ug via INTRAVENOUS
  Administered 2012-12-13 (×2): via INTRAVENOUS
  Administered 2012-12-13: 70.37 ug via INTRAVENOUS
  Administered 2012-12-13: 144.4 ug via INTRAVENOUS
  Administered 2012-12-13: 199.4 ug via INTRAVENOUS
  Administered 2012-12-14: 222.6 ug via INTRAVENOUS
  Administered 2012-12-14: 88.73 ug via INTRAVENOUS
  Administered 2012-12-14: 358.9 ug via INTRAVENOUS
  Administered 2012-12-14: 09:00:00 via INTRAVENOUS
  Administered 2012-12-14: 117.6 ug via INTRAVENOUS
  Administered 2012-12-14: 56.82 ug via INTRAVENOUS
  Administered 2012-12-15: 140 ug via INTRAVENOUS
  Administered 2012-12-15: 65 ug via INTRAVENOUS
  Administered 2012-12-15: 45.8 ug via INTRAVENOUS
  Filled 2012-12-12 (×10): qty 50

## 2012-12-12 MED ORDER — ENOXAPARIN SODIUM 30 MG/0.3ML ~~LOC~~ SOLN
40.0000 mg | SUBCUTANEOUS | Status: DC
  Administered 2012-12-12 – 2012-12-14 (×3): 40 mg via SUBCUTANEOUS
  Filled 2012-12-12 (×4): qty 0.4

## 2012-12-12 MED ORDER — MUPIROCIN 2 % EX OINT
1.0000 "application " | TOPICAL_OINTMENT | Freq: Two times a day (BID) | CUTANEOUS | Status: AC
  Administered 2012-12-12 – 2012-12-16 (×10): 1 via NASAL
  Filled 2012-12-12: qty 22

## 2012-12-12 MED ORDER — FUROSEMIDE 10 MG/ML IJ SOLN
20.0000 mg | Freq: Two times a day (BID) | INTRAMUSCULAR | Status: DC
  Administered 2012-12-12 – 2012-12-14 (×5): 20 mg via INTRAVENOUS
  Filled 2012-12-12 (×8): qty 2

## 2012-12-12 MED ORDER — POLYSACCHARIDE IRON COMPLEX 150 MG PO CAPS
150.0000 mg | ORAL_CAPSULE | Freq: Every day | ORAL | Status: DC
  Administered 2012-12-13 – 2012-12-20 (×8): 150 mg via ORAL
  Filled 2012-12-12 (×9): qty 1

## 2012-12-12 MED ORDER — CHLORHEXIDINE GLUCONATE CLOTH 2 % EX PADS
6.0000 | MEDICATED_PAD | Freq: Every day | CUTANEOUS | Status: AC
  Administered 2012-12-12 – 2012-12-15 (×4): 6 via TOPICAL

## 2012-12-12 MED ORDER — TRAMADOL HCL 50 MG PO TABS
50.0000 mg | ORAL_TABLET | Freq: Four times a day (QID) | ORAL | Status: DC | PRN
  Administered 2012-12-12 – 2012-12-17 (×3): 50 mg via ORAL
  Filled 2012-12-12 (×3): qty 1

## 2012-12-12 NOTE — Progress Notes (Signed)
1 Day Post-Op Procedure(s) (LRB): VIDEO BRONCHOSCOPY (N/A) VIDEO ASSISTED THORACOSCOPY (Right) DECORTICATION (N/A) Right baths decortication right empyema and peel of right lower lobe Out of bed to chair on PCA with incisional pain Oxygenation stable Minimal chest tube drainage  Objective: Vital signs in last 24 hours: Temp:  [97.8 F (36.6 C)-99.6 F (37.6 C)] 97.8 F (36.6 C) (01/25 1135) Pulse Rate:  [81-100] 81  (01/25 1600) Cardiac Rhythm:  [-] Normal sinus rhythm (01/25 1600) Resp:  [19-33] 25  (01/25 1600) BP: (115-162)/(62-84) 128/68 mmHg (01/25 1600) SpO2:  [96 %-100 %] 99 % (01/25 1600) Arterial Line BP: (134-205)/(59-84) 161/65 mmHg (01/25 1100) Weight:  [229 lb 8 oz (104.1 kg)] 229 lb 8 oz (104.1 kg) (01/25 0600)  Hemodynamic parameters for last 24 hours:    Intake/Output from previous day: 01/24 0701 - 01/25 0700 In: 4200.8 [P.O.:140; I.V.:2648.3; Blood:700; IV Piggyback:712.5] Out: 3255 [Urine:2625; Blood:200; Chest Tube:430] Intake/Output this shift: Total I/O In: 1295 [P.O.:340; I.V.:480; IV Piggyback:475] Out: 255 [Urine:205; Chest Tube:50]  Diminished breath sounds No airleak from chest tubes Neuro intact  Lab Results:  Basename 12/12/12 0443 12/11/12 0600  WBC 5.1 6.0  HGB 8.2* 8.7*  HCT 24.5* 26.4*  PLT 173 221   BMET:  Basename 12/12/12 0443 12/10/12 1350  NA 136 136  K 3.6 3.2*  CL 103 100  CO2 26 25  GLUCOSE 120* 97  BUN 5* 9  CREATININE 0.44* 0.53  CALCIUM 8.5 8.9    PT/INR:  Basename 12/11/12 0600  LABPROT 16.9*  INR 1.41   ABG    Component Value Date/Time   PHART 7.461* 12/12/2012 0450   HCO3 25.6* 12/12/2012 0450   TCO2 27 12/12/2012 0450   ACIDBASEDEF 0.7 12/01/2012 2312   O2SAT 96.0 12/12/2012 0450   CBG (last 3)  No results found for this basename: GLUCAP:3 in the last 72 hours  Assessment/Plan: S/P Procedure(s) (LRB): VIDEO BRONCHOSCOPY (N/A) VIDEO ASSISTED THORACOSCOPY (Right) DECORTICATION (N/A) See  progression orders   LOS: 11 days    VAN TRIGT III,Marcell Chavarin 12/12/2012

## 2012-12-13 ENCOUNTER — Inpatient Hospital Stay (HOSPITAL_COMMUNITY): Payer: 59

## 2012-12-13 LAB — TYPE AND SCREEN
ABO/RH(D): O POS
Antibody Screen: NEGATIVE
Donor AG Type: NEGATIVE
Donor AG Type: NEGATIVE
Donor AG Type: NEGATIVE
Unit division: 0
Unit division: 0
Unit division: 0

## 2012-12-13 LAB — COMPREHENSIVE METABOLIC PANEL
ALT: 12 U/L (ref 0–53)
AST: 19 U/L (ref 0–37)
Alkaline Phosphatase: 38 U/L — ABNORMAL LOW (ref 39–117)
CO2: 27 mEq/L (ref 19–32)
Chloride: 101 mEq/L (ref 96–112)
GFR calc Af Amer: >90 mL/min (ref >90)
GFR calc non Af Amer: >90 mL/min (ref >90)
Glucose, Bld: 91 mg/dL (ref 70–99)
Potassium: 3.3 mEq/L — ABNORMAL LOW (ref 3.5–5.1)
Sodium: 134 mEq/L — ABNORMAL LOW (ref 135–145)
Total Bilirubin: 0.4 mg/dL (ref 0.3–1.2)

## 2012-12-13 LAB — CULTURE, RESPIRATORY W GRAM STAIN: Culture: NO GROWTH

## 2012-12-13 LAB — CBC
Hemoglobin: 7.5 g/dL — ABNORMAL LOW (ref 13.0–17.0)
MCH: 31.3 pg (ref 26.0–34.0)
Platelets: 168 10*3/uL (ref 150–400)
RBC: 2.4 MIL/uL — ABNORMAL LOW (ref 4.22–5.81)
WBC: 6 10*3/uL (ref 4.0–10.5)

## 2012-12-13 MED ORDER — LACTULOSE 10 GM/15ML PO SOLN
10.0000 g | Freq: Every day | ORAL | Status: DC
  Filled 2012-12-13 (×5): qty 15

## 2012-12-13 NOTE — Progress Notes (Signed)
Medial chest tube removed per MD order.  Pt tolerated well.  No complications noted.  Will continue to monitor.  Adalie Mand, [facility]

## 2012-12-14 ENCOUNTER — Encounter: Payer: Self-pay | Admitting: Internal Medicine

## 2012-12-14 ENCOUNTER — Encounter (HOSPITAL_COMMUNITY): Payer: Self-pay | Admitting: Cardiothoracic Surgery

## 2012-12-14 ENCOUNTER — Inpatient Hospital Stay (HOSPITAL_COMMUNITY): Payer: 59

## 2012-12-14 LAB — CBC
HCT: 23.2 % — ABNORMAL LOW (ref 39.0–52.0)
Hemoglobin: 7.8 g/dL — ABNORMAL LOW (ref 13.0–17.0)
MCH: 32.5 pg (ref 26.0–34.0)
MCHC: 33.6 g/dL (ref 30.0–36.0)
MCV: 96.7 fL (ref 78.0–100.0)
Platelets: 170 10*3/uL (ref 150–400)
RBC: 2.4 MIL/uL — ABNORMAL LOW (ref 4.22–5.81)
RDW: 18.4 % — ABNORMAL HIGH (ref 11.5–15.5)
WBC: 6.7 10*3/uL (ref 4.0–10.5)

## 2012-12-14 LAB — TISSUE CULTURE

## 2012-12-14 LAB — VANCOMYCIN, TROUGH: Vancomycin Tr: 21.2 ug/mL — ABNORMAL HIGH (ref 10.0–20.0)

## 2012-12-14 LAB — BASIC METABOLIC PANEL
BUN: 9 mg/dL (ref 6–23)
CO2: 29 mEq/L (ref 19–32)
Calcium: 8.8 mg/dL (ref 8.4–10.5)
Chloride: 101 mEq/L (ref 96–112)
Creatinine, Ser: 0.95 mg/dL (ref 0.50–1.35)
GFR calc Af Amer: >90 mL/min (ref >90)
GFR calc non Af Amer: >90 mL/min (ref >90)
Glucose, Bld: 97 mg/dL (ref 70–99)
Potassium: 3.1 mEq/L — ABNORMAL LOW (ref 3.5–5.1)
Sodium: 136 mEq/L (ref 135–145)

## 2012-12-14 LAB — BODY FLUID CULTURE: Gram Stain: NONE SEEN

## 2012-12-14 MED ORDER — SODIUM CHLORIDE 0.9 % IJ SOLN
10.0000 mL | INTRAMUSCULAR | Status: DC | PRN
  Administered 2012-12-18 – 2012-12-20 (×4): 10 mL

## 2012-12-14 MED ORDER — SODIUM CHLORIDE 0.9 % IJ SOLN
10.0000 mL | Freq: Two times a day (BID) | INTRAMUSCULAR | Status: DC
  Administered 2012-12-14 – 2012-12-16 (×3): 10 mL

## 2012-12-14 NOTE — Progress Notes (Signed)
Put [employer] life ins. form on nurse's desk.

## 2012-12-14 NOTE — Progress Notes (Signed)
ANTIBIOTIC CONSULT NOTE - Follow Up  Pharmacy Consult for vancomycin  Indication: s/p VATS for RLL PNA and R empyema  Allergies  Allergen Reactions  . Red Dye Anaphylaxis    Lips swollen  3-4 times their baseline size  . Other Other (See Comments)    Strawberries "anything containing red dye"   Patient Measurements: Height: 5\' 3"  (160 cm) Weight: 229 lb 8 oz (104.1 kg) IBW/kg (Calculated) : 56.9   Vital Signs: Temp: 97.4 F (36.3 C) (01/27 0400) Temp src: Oral (01/27 0400) BP: 165/82 mmHg (01/27 0500) Pulse Rate: 86  (01/27 0500) Intake/Output from previous day: 01/26 0701 - 01/27 0700 In: 1242.7 [P.O.:300; I.V.:342.7; IV Piggyback:600] Out: 2705 [Urine:2675; Chest Tube:30] Intake/Output from this shift: Total I/O In: 432.7 [I.V.:182.7; IV Piggyback:250] Out: 530 [Urine:500; Chest Tube:30]  Labs:  [facility] 12/14/12 0430 12/13/12 0401 12/12/12 0443  WBC 6.7 6.0 5.1  HGB 7.8* 7.5* 8.2*  PLT 170 168 173  LABCREA -- -- --  CREATININE 0.95 0.54 0.44*   Estimated Creatinine Clearance: 99.7 ml/min (by C-G formula based on Cr of 0.95).  Basename 12/14/12 0430  VANCOTROUGH 21.2*  VANCOPEAK --  Drue Dun --  GENTTROUGH --  GENTPEAK --  GENTRANDOM --  TOBRATROUGH --  TOBRAPEAK --  TOBRARND --  AMIKACINPEAK --  AMIKACINTROU --  AMIKACIN --     Assessment: 52 yo male with RLL PNA/empyema s/p VATS  for empiric antibiotics.  Vancomycin level this morning drawn ~ 90 minutes prior to next dose.  Actual trough will fall within goal range.     Goal: Vancomycin trough 15-20 mcg/ml  Plan:  Continue current vancomycin regimen. F/U renal function  Geannie Risen, PharmD, BCPS 12/14/2012 5:36 AM

## 2012-12-14 NOTE — Progress Notes (Signed)
Patient ID: [name], male   DOB: 1961-01-19, 52 y.o.   MRN: [id number]   SICU Evening Rounds:  Hemodynamically stable. Ambulating. Awaiting bed on 3300 No new problems today.

## 2012-12-14 NOTE — Progress Notes (Signed)
3 Days Post-Op Procedure(s) (LRB): VIDEO BRONCHOSCOPY (N/A) VIDEO ASSISTED THORACOSCOPY (Right) DECORTICATION (N/A) Subjective: R VATS/decorticatio RLL periop anemia Min tube drainage  Objective: Vital signs in last 24 hours: Temp:  [97 F (36.1 C)-98.7 F (37.1 C)] 97.6 F (36.4 C) (01/27 0730) Pulse Rate:  [73-99] 84  (01/27 0800) Cardiac Rhythm:  [-] Normal sinus rhythm (01/27 0800) Resp:  [14-32] 25  (01/27 0800) BP: (118-179)/(57-101) 138/69 mmHg (01/27 0800) SpO2:  [95 %-99 %] 99 % (01/27 0800)  Hemodynamic parameters for last 24 hours:  nsr  Intake/Output from previous day: 01/26 0701 - 01/27 0700 In: 1532.7 [P.O.:300; I.V.:382.7; IV Piggyback:850] Out: 3075 [Urine:3025; Chest Tube:50] Intake/Output this shift: Total I/O In: 76 [I.V.:20; IV Piggyback:56] Out: -   No air leak Dec breath sounds on R  Lab Results:  Basename 12/14/12 0430 12/13/12 0401  WBC 6.7 6.0  HGB 7.8* 7.5*  HCT 23.2* 23.1*  PLT 170 168   BMET:  Basename 12/14/12 0430 12/13/12 0401  NA 136 134*  K 3.1* 3.3*  CL 101 101  CO2 29 27  GLUCOSE 97 91  BUN 9 6  CREATININE 0.95 0.54  CALCIUM 8.8 8.5    PT/INR: No results found for this basename: LABPROT,INR in the last 72 hours ABG    Component Value Date/Time   PHART 7.461* 12/12/2012 0450   HCO3 25.6* 12/12/2012 0450   TCO2 27 12/12/2012 0450   ACIDBASEDEF 0.7 12/01/2012 2312   O2SAT 96.0 12/12/2012 0450   CBG (last 3)  No results found for this basename: GLUCAP:3 in the last 72 hours  Assessment/Plan: S/P Procedure(s) (LRB): VIDEO BRONCHOSCOPY (N/A) VIDEO ASSISTED THORACOSCOPY (Right) DECORTICATION (N/A) Plan for transfer to step-down: see transfer orders See progression orders PICC line for 3 weeks iv antibiotics  LOS: 13 days    VAN TRIGT III,PETER 12/14/2012

## 2012-12-14 NOTE — Progress Notes (Signed)
Peripherally Inserted Central Catheter/Midline Placement  The IV Nurse has discussed with the patient and/or persons authorized to consent for the patient, the purpose of this procedure and the potential benefits and risks involved with this procedure.  The benefits include less needle sticks, lab draws from the catheter and patient may be discharged home with the catheter.  Risks include, but not limited to, infection, bleeding, blood clot (thrombus formation), and puncture of an artery; nerve damage and irregular heat beat.  Alternatives to this procedure were also discussed.  PICC/Midline Placement Documentation        Stacie Glaze Horton 12/14/2012, 12:54 PM

## 2012-12-14 NOTE — Op Note (Signed)
NAME:  [name], [name] NO.:  [id number]  MEDICAL RECORD NO.:  [id number]  LOCATION:                                 FACILITY:  PHYSICIAN:  Kerin Perna, M.D.  DATE OF BIRTH:  01-21-1961  DATE OF PROCEDURE:  12/11/2012 DATE OF DISCHARGE:                              OPERATIVE REPORT   OPERATION: 1. Video bronchoscopy. 2. Right VATS (video-assisted thoracoscopic surgery) with drainage of     empyema and decortication of right lower lobe.  PREOPERATIVE DIAGNOSIS:  Right lower lobe pneumonia with empyema.  POSTOPERATIVE DIAGNOSIS:  Right lower lobe pneumonia with empyema.  SURGEON:  Kerin Perna, M.D.  ASSISTANT:  Doree Fudge, PA-C.  ANESTHESIA:  General.  INDICATIONS:  The patient is a 52 year old gentleman with history of myeloma, status post stem cell transplant, who developed right lower lobe pneumonia and rapidly developed an empyema despite antibiotic therapy.  Thoracic surgical evaluation was requested and right VATS for drainage of empyema and decortication was recommended.  I discussed the procedure with the patient and his family and he understood and agreed to proceed.  OPERATIVE PROCEDURE:  The patient was brought to the operating room and placed in supine on the operating table where general anesthesia was induced.  A proper time-out was performed.  Through the endotracheal tube, a video bronchoscope was passed.  The distal trachea and carina were normal.  The bronchoscope was passed down the left mainstem bronchus, and the endobronchial segments of the left upper lobe and left lower lobe were visualized.  There were no endobronchial lesions or abnormal secretions.  The bronchoscope was then passed down the right mainstem bronchus.  The endobronchial segments of the right upper lobe, right middle lobe, and right lower lobe were identified and there was no evidence of foreign body or endobronchial lesions.  There were some mildly  increased secretions, which were sent for culture.  The bronchoscope was withdrawn. The patient was then reintubated with a double-lumen endotracheal tube, rolled to expose the right side up and prepped and draped as a sterile field.  A second time-out was performed.  A small incision was made in the fifth interspace.  The VATS scope was inserted.  The pleural space was obliterated with adhesions and glue-like material.  The incision was extended and the ribs were retracted.  The upper rib was divided due to the patient's thick thorax and double exposure.  We were then able to proceed into the pleural space to remove all the empyema material, which was mainly solid in nature.  A thick peel over the right lower lobe was dissected carefully off the visceral pleura.  Very thickened parietal pleura was also removed and sent for cultures and pathology.  We were able to free up the entire lung, breaking, open all the pockets.  The thorax was irrigated with vancomycin irrigation.  The material from the empyema peel was sent for culture and pathology.  The three chest tubes were placed to drain the pleural space and brought out through separate incisions.  The lung was re-expanded under direct vision and there was good re-expansion of the lower lobe.  The incision was then closed with #  2 Vicryl for pericostals.  The muscle layer was closed with interrupted #1 Vicryl.  The subcutaneous and skin layers were closed in running Vicryl.  The chest tubes were connected to an underwater seal Pleur-Evac drainage system.  The patient was then extubated and returned to the recovery room.     Kerin Perna, M.D.     PV/MEDQ  D:  12/12/2012  T:  12/12/2012  Job:  [id number]

## 2012-12-15 ENCOUNTER — Encounter: Payer: Self-pay | Admitting: Internal Medicine

## 2012-12-15 ENCOUNTER — Inpatient Hospital Stay (HOSPITAL_COMMUNITY): Payer: 59

## 2012-12-15 LAB — CBC
HCT: 22.8 % — ABNORMAL LOW (ref 39.0–52.0)
Hemoglobin: 7.4 g/dL — ABNORMAL LOW (ref 13.0–17.0)
MCH: 31.4 pg (ref 26.0–34.0)
MCHC: 32.5 g/dL (ref 30.0–36.0)
MCV: 96.6 fL (ref 78.0–100.0)
Platelets: 173 10*3/uL (ref 150–400)
RBC: 2.36 MIL/uL — ABNORMAL LOW (ref 4.22–5.81)
RDW: 18.7 % — ABNORMAL HIGH (ref 11.5–15.5)
WBC: 5.5 10*3/uL (ref 4.0–10.5)

## 2012-12-15 LAB — BASIC METABOLIC PANEL
BUN: 9 mg/dL (ref 6–23)
CO2: 28 mEq/L (ref 19–32)
Calcium: 8.7 mg/dL (ref 8.4–10.5)
Chloride: 100 mEq/L (ref 96–112)
Creatinine, Ser: 1.15 mg/dL (ref 0.50–1.35)
GFR calc Af Amer: 84 mL/min — ABNORMAL LOW (ref >90)
GFR calc non Af Amer: 73 mL/min — ABNORMAL LOW (ref >90)
Glucose, Bld: 97 mg/dL (ref 70–99)
Potassium: 3 mEq/L — ABNORMAL LOW (ref 3.5–5.1)
Sodium: 135 mEq/L (ref 135–145)

## 2012-12-15 MED ORDER — FUROSEMIDE 20 MG PO TABS
20.0000 mg | ORAL_TABLET | Freq: Every day | ORAL | Status: DC
  Filled 2012-12-15: qty 1

## 2012-12-15 MED ORDER — ENOXAPARIN SODIUM 40 MG/0.4ML ~~LOC~~ SOLN
40.0000 mg | SUBCUTANEOUS | Status: DC
  Administered 2012-12-15 – 2012-12-17 (×3): 40 mg via SUBCUTANEOUS
  Filled 2012-12-15 (×3): qty 0.4

## 2012-12-15 MED ORDER — POTASSIUM CHLORIDE 10 MEQ/50ML IV SOLN
10.0000 meq | INTRAVENOUS | Status: DC
  Administered 2012-12-15: 10 meq via INTRAVENOUS

## 2012-12-15 MED ORDER — POTASSIUM CHLORIDE 10 MEQ/50ML IV SOLN
INTRAVENOUS | Status: AC
  Administered 2012-12-15: 10 meq via INTRAVENOUS
  Filled 2012-12-15: qty 150

## 2012-12-15 NOTE — Progress Notes (Signed)
21 ml of fentanyl pca wasted into sink, witnessed by [employer]

## 2012-12-15 NOTE — Progress Notes (Signed)
4 Days Post-Op Procedure(s) (LRB): VIDEO BRONCHOSCOPY (N/A) VIDEO ASSISTED THORACOSCOPY (Right) DECORTICATION (N/A) Subjective:VATS for empyema Immunosuppressed- hx of myeloma Culture neg so far  Objective: Vital signs in last 24 hours: Temp:  [97.4 F (36.3 C)-97.9 F (36.6 C)] 97.9 F (36.6 C) (01/28 0733) Pulse Rate:  [67-94] 80  (01/28 0700) Cardiac Rhythm:  [-] Normal sinus rhythm (01/28 0745) Resp:  [18-29] 22  (01/28 0700) BP: (90-138)/(49-83) 90/62 mmHg (01/28 0400) SpO2:  [95 %-100 %] 98 % (01/28 0700) Weight:  [223 lb 8.7 oz (101.4 kg)] 223 lb 8.7 oz (101.4 kg) (01/28 0500)  Hemodynamic parameters for last 24 hours:  nsr  Intake/Output from previous day: 01/27 0701 - 01/28 0700 In: 2027.9 [P.O.:820; I.V.:549.9; IV Piggyback:658] Out: 2177 [Urine:2125; Stool:2; Chest Tube:50] Intake/Output this shift: Total I/O In: 64 [I.V.:14; IV Piggyback:50] Out: -    Incision clean Lab Results:  Basename 12/15/12 0439 12/14/12 0430  WBC 5.5 6.7  HGB 7.4* 7.8*  HCT 22.8* 23.2*  PLT 173 170   BMET:  Basename 12/15/12 0439 12/14/12 0430  NA 135 136  K 3.0* 3.1*  CL 100 101  CO2 28 29  GLUCOSE 97 97  BUN 9 9  CREATININE 1.15 0.95  CALCIUM 8.7 8.8    PT/INR: No results found for this basename: LABPROT,INR in the last 72 hours ABG    Component Value Date/Time   PHART 7.461* 12/12/2012 0450   HCO3 25.6* 12/12/2012 0450   TCO2 27 12/12/2012 0450   ACIDBASEDEF 0.7 12/01/2012 2312   O2SAT 96.0 12/12/2012 0450   CBG (last 3)  No results found for this basename: GLUCAP:3 in the last 72 hours  Assessment/Plan: S/P Procedure(s) (LRB): VIDEO BRONCHOSCOPY (N/A) VIDEO ASSISTED THORACOSCOPY (Right) DECORTICATION (N/A) Plan for transfer to step-down: see transfer orders Home 1-2 days with PICC antibiotics  LOS: 14 days    VAN TRIGT [name],[name] Cristina 12/15/2012

## 2012-12-15 NOTE — Progress Notes (Signed)
UR Completed.  Vangie Bicker [phone] 12/15/2012

## 2012-12-15 NOTE — Progress Notes (Signed)
4 Days Post-Op Procedure(s) (LRB): VIDEO BRONCHOSCOPY (N/A) VIDEO ASSISTED THORACOSCOPY (Right) DECORTICATION (N/A) Subjective:  Mr. Preusser has no new complaints   Objective: Vital signs in last 24 hours: Temp:  [97.4 F (36.3 C)-97.9 F (36.6 C)] 97.9 F (36.6 C) (01/28 0733) Pulse Rate:  [67-94] 80  (01/28 0700) Cardiac Rhythm:  [-] Normal sinus rhythm (01/28 0745) Resp:  [18-29] 22  (01/28 0700) BP: (90-138)/(49-83) 90/62 mmHg (01/28 0400) SpO2:  [95 %-100 %] 98 % (01/28 0700) Weight:  [223 lb 8.7 oz (101.4 kg)] 223 lb 8.7 oz (101.4 kg) (01/28 0500)  Intake/Output from previous day: 01/27 0701 - 01/28 0700 In: 2027.9 [P.O.:820; I.V.:549.9; IV Piggyback:658] Out: 2177 [Urine:2125; Stool:2; Chest Tube:50] Intake/Output this shift: Total I/O In: 64 [I.V.:14; IV Piggyback:50] Out: -   General appearance: alert, cooperative and no distress Heart: regular rate and rhythm Lungs: diminished breath sounds on the right Wound: clean and dry  Lab Results:  Basename 12/15/12 0439 12/14/12 0430  WBC 5.5 6.7  HGB 7.4* 7.8*  HCT 22.8* 23.2*  PLT 173 170   BMET:  Basename 12/15/12 0439 12/14/12 0430  NA 135 136  K 3.0* 3.1*  CL 100 101  CO2 28 29  GLUCOSE 97 97  BUN 9 9  CREATININE 1.15 0.95  CALCIUM 8.7 8.8    PT/INR: No results found for this basename: LABPROT,INR in the last 72 hours ABG    Component Value Date/Time   PHART 7.461* 12/12/2012 0450   HCO3 25.6* 12/12/2012 0450   TCO2 27 12/12/2012 0450   ACIDBASEDEF 0.7 12/01/2012 2312   O2SAT 96.0 12/12/2012 0450   CBG (last 3)  No results found for this basename: GLUCAP:3 in the last 72 hours  Assessment/Plan: S/P Procedure(s) (LRB): VIDEO BRONCHOSCOPY (N/A) VIDEO ASSISTED THORACOSCOPY (Right) DECORTICATION (N/A)  1. Chest tube- no air leak appreciated, remains on suction 50 cc output 2. Pulm- continue pulm toilet 3. Renal- creatinine- trending up this morning, will repeat in AM 4. Volume Overload-  patients weight is below admission, will change IV Lasix to PO 5. ID- needs IV ABX for 3 weeks of therapy, currently on Vanc/Zosyn 6. Hypokalemia- will supplement 7. Dispo- continue current care, awaiting bed on 3300   LOS: 14 days    Elwyn Lowden 12/15/2012

## 2012-12-15 NOTE — Progress Notes (Signed)
Faxed disability form to [employer] @ [phone].

## 2012-12-16 ENCOUNTER — Inpatient Hospital Stay (HOSPITAL_COMMUNITY): Payer: 59

## 2012-12-16 ENCOUNTER — Other Ambulatory Visit: Payer: Self-pay | Admitting: *Deleted

## 2012-12-16 DIAGNOSIS — Z9889 Other specified postprocedural states: Secondary | ICD-10-CM

## 2012-12-16 DIAGNOSIS — J90 Pleural effusion, not elsewhere classified: Secondary | ICD-10-CM

## 2012-12-16 DIAGNOSIS — J869 Pyothorax without fistula: Secondary | ICD-10-CM

## 2012-12-16 LAB — VANCOMYCIN, TROUGH: Vancomycin Tr: 35.2 ug/mL (ref 10.0–20.0)

## 2012-12-16 LAB — ANAEROBIC CULTURE: Gram Stain: NONE SEEN

## 2012-12-16 MED ORDER — IRBESARTAN 150 MG PO TABS
150.0000 mg | ORAL_TABLET | Freq: Every day | ORAL | Status: DC
  Administered 2012-12-16: 150 mg via ORAL
  Filled 2012-12-16 (×3): qty 1

## 2012-12-16 MED ORDER — WARFARIN - PHYSICIAN DOSING INPATIENT
Freq: Every day | Status: DC
  Administered 2012-12-17: 18:00:00

## 2012-12-16 MED ORDER — POLYSACCHARIDE IRON COMPLEX 150 MG PO CAPS: 150.0000 mg | ORAL_CAPSULE | Freq: Every day | ORAL | Status: DC

## 2012-12-16 MED ORDER — WARFARIN SODIUM 5 MG PO TABS
5.0000 mg | ORAL_TABLET | Freq: Every day | ORAL | Status: DC
  Administered 2012-12-16 – 2012-12-19 (×4): 5 mg via ORAL
  Filled 2012-12-16 (×5): qty 1

## 2012-12-16 MED ORDER — VANCOMYCIN HCL 10 G IV SOLR: INTRAVENOUS | Status: DC

## 2012-12-16 MED ORDER — AMOXICILLIN-POT CLAVULANATE 875-125 MG PO TABS: 1.0000 | ORAL_TABLET | Freq: Two times a day (BID) | ORAL | Status: DC

## 2012-12-16 NOTE — Progress Notes (Signed)
[facility]  Patient Status: New  [facility] is providing the following services: RN and Home Infusion Services (teaching and education will be done by nurse in the home with patient and caregiver)  If patient discharges after hours, please call [phone].   [name] 12/16/2012, 12:40 PM

## 2012-12-16 NOTE — Progress Notes (Addendum)
                   [address]            [place] [address]          [phone]    5 Days Post-Op Procedure(s) (LRB): VIDEO BRONCHOSCOPY (N/A) VIDEO ASSISTED THORACOSCOPY (Right) DECORTICATION (N/A)  Subjective: Patient feeling ok  Objective: Vital signs in last 24 hours: Temp:  [97.9 F (36.6 C)-99.2 F (37.3 C)] 99.2 F (37.3 C) (01/29 0424) Pulse Rate:  [81-95] 95  (01/29 0424) Cardiac Rhythm:  [-] Normal sinus rhythm;Heart block (01/28 2015) Resp:  [20-25] 20  (01/29 0424) BP: (129-159)/(65-88) 159/88 mmHg (01/29 0424) SpO2:  [97 %-100 %] 97 % (01/29 0424)     Intake/Output from previous day: 01/28 0701 - 01/29 0700 In: 1324 [P.O.:940; I.V.:34; IV Piggyback:350] Out: 1750 [Urine:1750]   Physical Exam:  Cardiovascular: RRR, no murmurs, gallops, or rubs. Pulmonary: Clear on left and diminished on right; no rales, wheezes, or rhonchi. Abdomen: Soft, non tender, bowel sounds present. Wounds: Clean and dry.  No erythema or signs of infection.   Lab Results: CBC: Basename 12/15/12 0439 12/14/12 0430  WBC 5.5 6.7  HGB 7.4* 7.8*  HCT 22.8* 23.2*  PLT 173 170   BMET:  Basename 12/15/12 0439 12/14/12 0430  NA 135 136  K 3.0* 3.1*  CL 100 101  CO2 28 29  GLUCOSE 97 97  BUN 9 9  CREATININE 1.15 0.95  CALCIUM 8.7 8.8    PT/INR: No results found for this basename: LABPROT,INR in the last 72 hours ABG:  INR: Will add last result for INR, ABG once components are confirmed Will add last 4 CBG results once components are confirmed  Assessment/Plan:  1. CV - SR/ST. Hypertensive. On Avalide pre op. Will restart Irbesartan only. May need Lopressor as well. Was on Coumadin pre op for hypercoagulable state. Not restarted yet. 2.  Pulmonary - CXR this am shows no pneuothorax, small right pleural effusion and atelectasis.Final culture showed no WBC, no organisms, or growth. On Zosyn and Vancomycin. 3.ABL anemia- last H and H 7.4 and 22.8. On  iron 4.Possible discharge in am  ZIMMERMAN,DONIELLE MPA-C 12/16/2012,8:33 AM

## 2012-12-16 NOTE — Progress Notes (Signed)
ANTIBIOTIC CONSULT NOTE - FOLLOW UP  Pharmacy Consult for Vancomcyin Indication: s/p VATS for RLL PNA and R empyema   Allergies  Allergen Reactions  . Red Dye Anaphylaxis    Lips swollen  3-4 times their baseline size  . Other Other (See Comments)    Strawberries "anything containing red dye"    Patient Measurements: Height: 5\' 3"  (160 cm) Weight: 223 lb 8.7 oz (101.4 kg) IBW/kg (Calculated) : 56.9  Adjusted Body Weight:   Vital Signs: Temp: 97.9 F (36.6 C) (01/29 2128) Temp src: Oral (01/29 2128) BP: 118/50 mmHg (01/29 2128) Pulse Rate: 90  (01/29 2128) Intake/Output from previous day: 01/28 0701 - 01/29 0700 In: 1524 [P.O.:940; I.V.:34; IV Piggyback:550] Out: 1750 [Urine:1750] Intake/Output from this shift:    Labs:  Basename 12/15/12 0439 12/14/12 0430  WBC 5.5 6.7  HGB 7.4* 7.8*  PLT 173 170  LABCREA -- --  CREATININE 1.15 0.95   Estimated Creatinine Clearance: 81.2 ml/min (by C-G formula based on Cr of 1.15).  Basename 12/16/12 2128 12/14/12 0430  VANCOTROUGH 35.2* 21.2*  VANCOPEAK -- --  Drue Dun -- --  GENTTROUGH -- --  GENTPEAK -- --  GENTRANDOM -- --  TOBRATROUGH -- --  TOBRAPEAK -- --  TOBRARND -- --  AMIKACINPEAK -- --  AMIKACINTROU -- --  AMIKACIN -- --     Microbiology: Recent Results (from the past 720 hour(s))  CULTURE, BLOOD (ROUTINE X 2)     Status: Normal   Collection Time   12/01/12  8:55 PM      Component Value Range Status Comment   Specimen Description BLOOD LEFT HAND   Final    Special Requests BOTTLES DRAWN AEROBIC AND ANAEROBIC 5CC   Final    Culture  Setup Time 12/02/2012 02:22   Final    Culture NO GROWTH 5 DAYS   Final    Report Status 12/08/2012 FINAL   Final   CULTURE, BLOOD (ROUTINE X 2)     Status: Normal   Collection Time   12/01/12  9:02 PM      Component Value Range Status Comment   Specimen Description BLOOD RIGHT HAND   Final    Special Requests BOTTLES DRAWN AEROBIC AND ANAEROBIC 5CC   Final     Culture  Setup Time 12/02/2012 02:22   Final    Culture NO GROWTH 5 DAYS   Final    Report Status 12/08/2012 FINAL   Final   MRSA PCR SCREENING     Status: Abnormal   Collection Time   12/02/12 10:55 AM      Component Value Range Status Comment   MRSA by PCR POSITIVE (*) NEGATIVE Final   CULTURE, EXPECTORATED SPUTUM-ASSESSMENT     Status: Normal   Collection Time   12/05/12  4:37 PM      Component Value Range Status Comment   Specimen Description SPUTUM   Final    Special Requests Immunocompromised   Final    Sputum evaluation     Final    Value: THIS SPECIMEN IS ACCEPTABLE. RESPIRATORY CULTURE REPORT TO FOLLOW.   Report Status 12/05/2012 FINAL   Final   CULTURE, RESPIRATORY     Status: Normal   Collection Time   12/05/12  4:37 PM      Component Value Range Status Comment   Specimen Description SPUTUM   Final    Special Requests NONE   Final    Gram Stain     Final  Value: RARE WBC PRESENT, PREDOMINANTLY MONONUCLEAR     RARE SQUAMOUS EPITHELIAL CELLS PRESENT     RARE GRAM POSITIVE COCCI     IN PAIRS   Culture NORMAL OROPHARYNGEAL FLORA   Final    Report Status 12/08/2012 FINAL   Final   BODY FLUID CULTURE     Status: Normal   Collection Time   12/07/12  2:15 PM      Component Value Range Status Comment   Specimen Description PLEURAL   Final    Special Requests NONE   Final    Gram Stain     Final    Value: FEW WBC PRESENT, PREDOMINANTLY MONONUCLEAR     NO ORGANISMS SEEN   Culture NO GROWTH 3 DAYS   Final    Report Status 12/11/2012 FINAL   Final   CULTURE, RESPIRATORY     Status: Normal   Collection Time   12/11/12  8:09 AM      Component Value Range Status Comment   Specimen Description BRONCHIAL WASHINGS   Final    Special Requests PT ON ZOSYN   Final    Gram Stain     Final    Value: FEW WBC PRESENT,BOTH PMN AND MONONUCLEAR     FEW SQUAMOUS EPITHELIAL CELLS PRESENT     NO ORGANISMS SEEN   Culture NO GROWTH 2 DAYS   Final    Report Status 12/13/2012 FINAL   Final    BODY FLUID CULTURE     Status: Normal   Collection Time   12/11/12  9:23 AM      Component Value Range Status Comment   Specimen Description FLUID PLEURAL RIGHT   Final    Special Requests PT ON ZOSYN   Final    Gram Stain     Final    Value: NO WBC SEEN     NO ORGANISMS SEEN     [id number]   Culture NO GROWTH 3 DAYS   Final    Report Status 12/14/2012 FINAL   Final   TISSUE CULTURE     Status: Normal   Collection Time   12/11/12  9:55 AM      Component Value Range Status Comment   Specimen Description TISSUE   Final    Special Requests PLEURAL PEEL RIGHT PT ON ZOSYN   Final    Gram Stain     Final    Value: NO WBC SEEN     NO ORGANISMS SEEN   Culture NO GROWTH 3 DAYS   Final    Report Status 12/14/2012 FINAL   Final   ANAEROBIC CULTURE     Status: Normal   Collection Time   12/11/12  9:55 AM      Component Value Range Status Comment   Specimen Description TISSUE   Final    Special Requests PLEURAL PEEL RIGHT PT ON ZOSYN   Final    Gram Stain     Final    Value: NO WBC SEEN     NO ORGANISMS SEEN   Culture NO ANAEROBES ISOLATED   Final    Report Status 12/16/2012 FINAL   Final     Anti-infectives     Start     Dose/Rate Route Frequency Ordered Stop   12/16/12 0000   amoxicillin-clavulanate (AUGMENTIN) 875-125 MG per tablet        1 tablet Oral 2 times daily 12/16/12 0949     12/16/12 0000   sodium chloride 0.9 % SOLN  500 mL with vancomycin 10 G SOLR        250 mL/hr over 120 Minutes   12/16/12 0949     12/11/12 2200   vancomycin (VANCOCIN) IVPB 1000 mg/200 mL premix  Status:  Discontinued        1,000 mg 200 mL/hr over 60 Minutes Intravenous Every 12 hours 12/11/12 1815 12/11/12 1850   12/11/12 2200  piperacillin-tazobactam (ZOSYN) IVPB 3.375 g       3.375 g 12.5 mL/hr over 240 Minutes Intravenous 3 times per day 12/11/12 1815     12/11/12 2200   ceFEPIme (MAXIPIME) 2 g in dextrose 5 % 50 mL IVPB  Status:  Discontinued        2 g 100 mL/hr over 30 Minutes  Intravenous Every 12 hours 12/11/12 1545 12/11/12 1843   12/11/12 2200   vancomycin (VANCOCIN) IVPB 1000 mg/200 mL premix        1,000 mg 200 mL/hr over 60 Minutes Intravenous Every 8 hours 12/11/12 1850     12/11/12 1002   vancomycin 1500 mg in NS ( ) irrigation  Status:  Discontinued          As needed 12/11/12 1002 12/11/12 1054   12/11/12 0500   cefUROXime (ZINACEF) 1.5 g in dextrose 5 % 50 mL IVPB        1.5 g 100 mL/hr over 30 Minutes Intravenous 60 min pre-op 12/09/12 1642 12/11/12 0805   12/10/12 0900   levofloxacin (LEVAQUIN) IVPB 750 mg  Status:  Discontinued        750 mg 100 mL/hr over 90 Minutes Intravenous Every 24 hours 12/09/12 1358 12/09/12 1359   12/10/12 0900   levofloxacin (LEVAQUIN) tablet 750 mg  Status:  Discontinued        750 mg Oral Daily 12/09/12 1359 12/11/12 1815   12/08/12 1400   vancomycin (VANCOCIN) 1,500 mg in sodium chloride 0.9 % 500 mL IVPB  Status:  Discontinued        1,500 mg 250 mL/hr over 120 Minutes Intravenous Every 12 hours 12/08/12 0735 12/08/12 0813   12/05/12 2200   vancomycin (VANCOCIN) IVPB 1000 mg/200 mL premix  Status:  Discontinued        1,000 mg 200 mL/hr over 60 Minutes Intravenous Every 12 hours 12/05/12 1013 12/08/12 0735   12/05/12 1400   ceFEPIme (MAXIPIME) 2 g in dextrose 5 % 50 mL IVPB  Status:  Discontinued        2 g 100 mL/hr over 30 Minutes Intravenous 3 times per day 12/05/12 1013 12/11/12 1545   12/05/12 1100   vancomycin (VANCOCIN) 2,000 mg in sodium chloride 0.9 % 500 mL IVPB        2,000 mg 250 mL/hr over 120 Minutes Intravenous  Once 12/05/12 1013 12/05/12 1559   12/04/12 1000   levofloxacin (LEVAQUIN) tablet 500 mg  Status:  Discontinued        500 mg Oral Daily 12/04/12 0734 12/09/12 1357   12/02/12 0600   piperacillin-tazobactam (ZOSYN) IVPB 3.375 g  Status:  Discontinued        3.375 g 12.5 mL/hr over 240 Minutes Intravenous 3 times per day 12/02/12 0114 12/04/12 0734   12/02/12 0115   piperacillin-tazobactam (ZOSYN) IVPB 3.375 g       3.375 g 100 mL/hr over 30 Minutes Intravenous  Once 12/02/12 0112 12/02/12 0150   12/02/12 0115   oseltamivir (TAMIFLU) capsule 75 mg  Status:  Discontinued  75 mg Oral 2 times daily 12/02/12 0113 12/03/12 0740   12/01/12 2330   levofloxacin (LEVAQUIN) IVPB 750 mg  Status:  Discontinued        750 mg 100 mL/hr over 90 Minutes Intravenous Every 24 hours 12/01/12 2317 12/04/12 0734   12/01/12 2100   levofloxacin (LEVAQUIN) IVPB 750 mg        750 mg 100 mL/hr over 90 Minutes Intravenous  Once 12/01/12 2050 12/01/12 2250          Assessment: 51yom on Vancomycin for RLL PNA/empyema s/p VATS. Vancomycin level (35.2) is now supratherapeutic. Patient is afebrile, WBC wnl and cultures are negative. Patient's SCr has been increasing (0.54-->0.95-->1.15) and UOP is declining (down to 0.4 ml/kg/hr). Plan is to discharge patient to complete 10 more days of Vancomycin - will hold Vancomycin for now and check random level with AM labs to better calculate kinetics and accurate Vancomycin regimen.   Goal of Therapy:  Vancomycin trough level 15-20 mcg/ml  Plan:  1. Hold Vancomycin for now 2. Check random Vancomycin level with AM labs to better calculate kinetics  [name] [phone] 12/16/2012,10:59 PM

## 2012-12-16 NOTE — Care Management Note (Unsigned)
    Page 1 of 2   12/16/2012     1:23:59 PM   CARE MANAGEMENT NOTE 12/16/2012  Patient:  [name], [name]   Account Number:  [id number]  Date Initiated:  12/03/2012  Documentation initiated by:  DAVIS,RHONDA  Subjective/Objective Assessment:   pt with hx of active treatment for multiple myeloma and pna by aspiration.poss sepsis due to immunity     Action/Plan:   from home with spouse   Anticipated DC Date:  12/17/2012   Anticipated DC Plan:  HOME W HOME HEALTH SERVICES      DC Planning Services  CM consult      [facility] Choice  HOME HEALTH   Choice offered to / List presented to:  C-1 Patient   DME arranged  IV PUMP/EQUIPMENT      DME agency  [facility]     [facility] arranged  HH-1 RN      [facility] agency  [facility]   Status of service:  In process, will continue to follow Medicare Important Message given?   (If response is "NO", the following Medicare IM given date fields will be blank) Date Medicare IM given:   Date Additional Medicare IM given:    Discharge Disposition:  HOME W HOME HEALTH SERVICES  Per UR Regulation:  Reviewed for med. necessity/level of care/duration of stay  If discussed at Long Length of Stay Meetings, dates discussed:   12/15/2012    Comments:  12/16/12 Edom Schmuhl,RN,BSN [phone] PT FOR POSSIBLE DC 12/17/12.  WILL NEED HOME IV ANTIBIOTICS AND HHRN.  MET WITH PT TO DISCUSS DC ARRANGEMENTS. REFERRAL TO [facility], PER PT CHOICE.   START OF CARE WITHIN 24H OF DC DATE.  PT STATES THAT HIS MOTHER CAN ASSIST WITH IV INFUSIONS.  12-15-12 sjb 9:35am Avie Arenas, RNBSN [phone] Post op VATS for emphyema on 12-12-12.  Awaiting intermediate bed - continues on IV antibiotics and IV lasix for fluid overload.  [id number] Earlene Plater, RN, BSN, CCM:  CHART REVIEWED AND UPDATED.  Next chart review due on [id number]. NO DISCHARGE NEEDS PRESENT AT THIS TIME. CASE MANAGEMENT [phone]

## 2012-12-16 NOTE — Discharge Summary (Signed)
Physician Discharge Summary  Patient ID: [name] MRN: [id number] DOB/AGE: 03/05/1961 52 y.o.  Admit date: 12/01/2012 Discharge date: 12/16/2012  Admission Diagnoses:  Patient Active Problem List  Diagnosis  . Multiple myeloma  . Vasculitis  . Chronic anticoagulation  . Chronic pain  . Hypertension  . Bacteremia  . Pneumonia  . Pleural effusion   Discharge Diagnoses:   Patient Active Problem List  Diagnosis  . Multiple myeloma  . Vasculitis  . Chronic anticoagulation  . Chronic pain  . Hypertension  . Bacteremia  . Pneumonia  . Pleural effusion  . S/P thoracotomy  . Empyema of lung   Discharged Condition: good  History of Present Illness:   [name] is a 52 yo African American Male with known history of Multiple Myeloma currently undergoing treatment.  He presented to the Emergency Department with a complaint of sudden onset right sided rib pain.  He had previously been evaluated by his [name] with URI symptoms for which he was treated with a Z-pack.  CTA of the chest was obtained in the ED and ruled out evidence of PE, but showed evidence of bilateral pneumonia with aspiration as possible etiology.  Due to this he was admitted by the medicine service for further treatment.    Hospital Course:   The patient was placed on Levaquin and Zosyn for coverage of pneumonia.  Blood and sputum cultures were also obtained all of which have remained negative during hospitalization.  He was placed on nebulizer treatments and IV steroid regimens to aid in treatment of COPD exacerbation.  The patient was on salvage chemotherapy regimen at home and this was placed on hold during hospitalization.  Patient initially was getting better.  However, HD #3 patient developed worsening dyspnea.  CXR was obtained and showed worsening of a right sided pleural effusion with partial loculation along the right chest wall.  Pulmonology was consulted and felt Thoracentesis would be  beneficial.  However, patient was on Coumadin on required reversal with FFP prior to proceeding with procedure.  HD #5 Thoracentesis was performed and approximately 150 cc of hazy amber fluid was removed.  This specimen was sent for culture and cytology.  Results suggested the effusion to be exudative in nature and TCTS was consulted for VATS.  [name] evaluated the patient on 12/08/2012 at which time it was felt the patient would benefit from transfer to [facility] to proceed with surgical intervention.  The risks and benefits of the procedure were explained to the patient and he was agreeable to proceed.  The patient was taken to the operating room on 12/11/2012.  He underwent Right VATS with Mini Thoracotomy with drainage of pleural effusion and decortication.  Fluid was sent for routine culture and cytology.  He tolerated the procedure well, was extubated and taken to the ICU in stable condition.  During his stay in the ICU the patient's arterial line was removed.  His chest tubes remained in place with no evidence of air leak and were gradually removed without difficulty was fluid output had decreased.  He underwent PICC line placement for prolonged course of IV Antibiotics.  Once medically stable he was transferred to the step down unit in stable condition.  The patient has been restarted on his home regimen of Coumadin for his hyper-coagulable state.  He will need to follow up with his PCP for INR monitoring.  The patient's OR cultures have remained negative.  His Zosyn has been discontinued and he  was placed on Augmentin 875/125 mg BID for 10 days.  His Vancomycin has been changed to Cefipime, and he will continue this for an additional 2 weeks.   His creatinine has been elevated, but stable.  The patient is medically stable otherwise.  The patient will have home health to aid with home IV Antibiotics.  The patient will need to follow up with [name].  He will also need to follow up with  [name]                  Consults: pulmonary/intensive care  Treatments: surgery:   Video bronchoscopy.  2. Right VATS (video-assisted thoracoscopic surgery) with drainage of  empyema and decortication of right lower lobe.  Disposition: 01-Home or Self Care  Discharge Orders    Future Appointments: Provider: Department: Dept Phone: Center:   12/30/2012 3:00 PM [name] Perna, MD [facility] [place] [phone] [facility]   01/18/2013 3:00 PM [name] [facility] MEDICAL ONCOLOGY [phone] None   01/18/2013 3:30 PM [name] Gaul, MD [facility] MEDICAL ONCOLOGY [phone] None          Medication List     As of 12/20/2012 11:51 AM    TAKE these medications         acetaminophen 500 MG tablet   Commonly known as: TYLENOL   Take 1,000 mg by mouth every 6 (six) hours as needed. For pain/fever.      benzonatate 200 MG capsule   Commonly known as: TESSALON   Take 1 capsule (200 mg total) by mouth 3 (three) times daily as needed for cough.      cyclophosphamide 50 MG tablet   Commonly known as: CYTOXAN   Take 50 mg by mouth every other day.      dexamethasone 4 MG tablet   Commonly known as: DECADRON   Take 40 mg by mouth every 7 (seven) days.      dextrose 5 % SOLN 50 mL with ceFEPIme 1 G SOLR   1g IV every 12 hours      gabapentin 300 MG capsule   Commonly known as: NEURONTIN   Take 600 mg by mouth 3 (three) times daily.      hydrALAZINE 25 MG tablet   Commonly known as: APRESOLINE   Take 1 tablet (25 mg total) by mouth 2 (two) times daily.      HYDROcodone-acetaminophen 10-325 MG per tablet   Commonly known as: NORCO   Take 1-2 tablets by mouth every 6 (six) hours as needed for pain. For pain.      irbesartan-hydrochlorothiazide 300-12.5 MG per tablet   Commonly known as: AVALIDE   Take 1 tablet by mouth daily with breakfast.      iron polysaccharides 150 MG capsule    Commonly known as: NIFEREX   Take 1 capsule (150 mg total) by mouth daily.      oxyCODONE-acetaminophen 5-325 MG per tablet   Commonly known as: PERCOCET/ROXICET   Take 1-2 tablets by mouth every 4 (four) hours as needed for pain.      POMALYST 4 MG capsule   Generic drug: pomalidomide   Take 4 mg by mouth daily. Take with water on days 1-21. Repeat every 28 days.      warfarin 5 MG tablet   Commonly known as: COUMADIN   Take 5 mg by mouth daily.  Follow-up Information    Follow up with VAN Dinah Beers, MD. On 12/30/2012. (Appointment is at 3:00)    Contact information:   [address] [employer] [address] [place] [address] [phone]       Follow up with [facility]. On 12/30/2012. (Please get CXR at 2:00 pm)    Contact information:   [place]       Follow up with PT/INR level. (Please have PT/INR drawn by managing provider on Monday 12/21/2012)          Signed: Lowella Dandy 12/16/2012, 12:58 PM

## 2012-12-17 ENCOUNTER — Inpatient Hospital Stay (HOSPITAL_COMMUNITY): Payer: 59

## 2012-12-17 LAB — BASIC METABOLIC PANEL
BUN: 10 mg/dL (ref 6–23)
Chloride: 104 mEq/L (ref 96–112)
GFR calc Af Amer: 46 mL/min — ABNORMAL LOW (ref >90)
GFR calc non Af Amer: 40 mL/min — ABNORMAL LOW (ref >90)
Potassium: 3.3 mEq/L — ABNORMAL LOW (ref 3.5–5.1)
Sodium: 139 mEq/L (ref 135–145)

## 2012-12-17 LAB — CBC
Platelets: 166 10*3/uL (ref 150–400)
RBC: 2.17 MIL/uL — ABNORMAL LOW (ref 4.22–5.81)
RDW: 18.5 % — ABNORMAL HIGH (ref 11.5–15.5)
WBC: 5 10*3/uL (ref 4.0–10.5)

## 2012-12-17 LAB — PREPARE RBC (CROSSMATCH)

## 2012-12-17 LAB — PROTIME-INR
INR: 1.28 (ref 0.00–1.49)
Prothrombin Time: 15.7 seconds — ABNORMAL HIGH (ref 11.6–15.2)

## 2012-12-17 LAB — VANCOMYCIN, RANDOM: Vancomycin Rm: 29 ug/mL

## 2012-12-17 MED ORDER — POTASSIUM CHLORIDE CRYS ER 20 MEQ PO TBCR
30.0000 meq | EXTENDED_RELEASE_TABLET | Freq: Once | ORAL | Status: AC
  Administered 2012-12-17: 30 meq via ORAL
  Filled 2012-12-17: qty 1

## 2012-12-17 MED ORDER — DEXTROSE 5 % IV SOLN
1.0000 g | Freq: Two times a day (BID) | INTRAVENOUS | Status: DC
  Administered 2012-12-17 – 2012-12-20 (×6): 1 g via INTRAVENOUS
  Filled 2012-12-17 (×7): qty 1

## 2012-12-17 MED ORDER — HYDRALAZINE HCL 25 MG PO TABS
25.0000 mg | ORAL_TABLET | Freq: Two times a day (BID) | ORAL | Status: DC
  Administered 2012-12-17 – 2012-12-20 (×7): 25 mg via ORAL
  Filled 2012-12-17 (×8): qty 1

## 2012-12-17 MED ORDER — ENOXAPARIN SODIUM 30 MG/0.3ML ~~LOC~~ SOLN
30.0000 mg | SUBCUTANEOUS | Status: DC
  Administered 2012-12-18 – 2012-12-19 (×2): 30 mg via SUBCUTANEOUS
  Filled 2012-12-17 (×3): qty 0.3

## 2012-12-17 NOTE — Progress Notes (Signed)
Pt ambulated 500 ft in hallway pushing walker. Pt did not have any complaints. Will continue to monitor pt closely.

## 2012-12-17 NOTE — Progress Notes (Addendum)
                   [address]            [place] [address]          [phone]    6 Days Post-Op Procedure(s) (LRB): VIDEO BRONCHOSCOPY (N/A) VIDEO ASSISTED THORACOSCOPY (Right) DECORTICATION (N/A)  Subjective: Patient feeling ok  Objective: Vital signs in last 24 hours: Temp:  [97.9 F (36.6 C)-98.2 F (36.8 C)] 98.1 F (36.7 C) (01/30 0510) Pulse Rate:  [77-90] 77  (01/30 0510) Cardiac Rhythm:  [-] Normal sinus rhythm;Heart block (01/29 2030) Resp:  [18] 18  (01/30 0510) BP: (118-159)/(50-82) 128/57 mmHg (01/30 0510) SpO2:  [96 %-99 %] 99 % (01/30 0510) Weight:  [101.107 kg (222 lb 14.4 oz)] 101.107 kg (222 lb 14.4 oz) (01/30 0510)     Intake/Output from previous day: 01/29 0701 - 01/30 0700 In: 730 [P.O.:480; IV Piggyback:250] Out: 1050 [Urine:1050]   Physical Exam:  Cardiovascular: RRR, no murmurs, gallops, or rubs. Pulmonary: Clear on left and diminished on right base; no rales, wheezes, or rhonchi. Abdomen: Soft, non tender, bowel sounds present. Extremeties:No lower extremity edema Wounds: Clean and dry.  No erythema or signs of infection.   Lab Results: CBC:  Basename 12/17/12 0415 12/15/12 0439  WBC 5.0 5.5  HGB 7.0* 7.4*  HCT 21.0* 22.8*  PLT 166 173   BMET:   Basename 12/17/12 0415 12/15/12 0439  NA 139 135  K 3.3* 3.0*  CL 104 100  CO2 26 28  GLUCOSE 89 97  BUN 10 9  CREATININE 1.89* 1.15  CALCIUM 8.4 8.7    PT/INR:   Basename 12/17/12 0415  LABPROT 15.7*  INR 1.28   ABG:  INR: Will add last result for INR, ABG once components are confirmed Will add last 4 CBG results once components are confirmed  Assessment/Plan:  1. CV - SR/ST. Hypertensive. On Avalide pre op. Was restarted on Irbesartan only. Will stop as creatinine increased. May need Lopressor. Continue Coumadin. 2.  Pulmonary - CXR this am appears stable  (no pneuothorax, small right pleural effusion and atelectasis).Final culture showed no WBC, no  organisms, or growth. On Zosyn and Vancomycin.Pharmacy to adjust Vanco as creatinine increased 3.ABL anemia- last H and H 7.0 and 21.On iron. Will discuss if needs transfusion-not symptomatic. 4.Supplement postassium 5.Creatinine increased from 1.15 to 1.89. Will recheck in am  ZIMMERMAN,DONIELLE MPA-C 12/17/2012,7:48 AM  patient examined and medical record reviewed,agree with above note.  Stop vanco with rising creatinine Diarrhea- check C diff Chg home IV antibiotics to Q 12h  Cefepime for easier schedule(2 more weeks) VAN TRIGT III,Jonerik Sliker 12/17/2012

## 2012-12-17 NOTE — Progress Notes (Signed)
ANTIBIOTIC CONSULT NOTE - FOLLOW UP  Pharmacy Consult for Vancomcyin Indication: s/p VATS for RLL PNA and R empyema   Allergies  Allergen Reactions  . Red Dye Anaphylaxis    Lips swollen  3-4 times their baseline size  . Other Other (See Comments)    Strawberries "anything containing red dye"    Patient Measurements: Height: 5\' 3"  (160 cm) Weight: 222 lb 14.4 oz (101.107 kg) IBW/kg (Calculated) : 56.9  Adjusted Body Weight:   Vital Signs: Temp: 98.1 F (36.7 C) (01/30 0510) Temp src: Oral (01/30 0510) BP: 128/57 mmHg (01/30 0510) Pulse Rate: 77  (01/30 0510) Intake/Output from previous day: 01/29 0701 - 01/30 0700 In: 730 [P.O.:480; IV Piggyback:250] Out: 1050 [Urine:1050] Intake/Output from this shift:    Labs:  Basename 12/17/12 0415 12/15/12 0439  WBC 5.0 5.5  HGB 7.0* 7.4*  PLT 166 173  LABCREA -- --  CREATININE 1.89* 1.15   Estimated Creatinine Clearance: 49.3 ml/min (by C-G formula based on Cr of 1.89).  Basename 12/17/12 1610 12/16/12 2128  VANCOTROUGH -- 35.2*  VANCOPEAK -- --  Drue Dun 29.0 --  GENTTROUGH -- --  GENTPEAK -- --  GENTRANDOM -- --  TOBRATROUGH -- --  TOBRAPEAK -- --  TOBRARND -- --  AMIKACINPEAK -- --  AMIKACINTROU -- --  AMIKACIN -- --     Microbiology: Recent Results (from the past 720 hour(s))  CULTURE, BLOOD (ROUTINE X 2)     Status: Normal   Collection Time   12/01/12  8:55 PM      Component Value Range Status Comment   Specimen Description BLOOD LEFT HAND   Final    Special Requests BOTTLES DRAWN AEROBIC AND ANAEROBIC 5CC   Final    Culture  Setup Time 12/02/2012 02:22   Final    Culture NO GROWTH 5 DAYS   Final    Report Status 12/08/2012 FINAL   Final   CULTURE, BLOOD (ROUTINE X 2)     Status: Normal   Collection Time   12/01/12  9:02 PM      Component Value Range Status Comment   Specimen Description BLOOD RIGHT HAND   Final    Special Requests BOTTLES DRAWN AEROBIC AND ANAEROBIC 5CC   Final    Culture   Setup Time 12/02/2012 02:22   Final    Culture NO GROWTH 5 DAYS   Final    Report Status 12/08/2012 FINAL   Final   MRSA PCR SCREENING     Status: Abnormal   Collection Time   12/02/12 10:55 AM      Component Value Range Status Comment   MRSA by PCR POSITIVE (*) NEGATIVE Final   CULTURE, EXPECTORATED SPUTUM-ASSESSMENT     Status: Normal   Collection Time   12/05/12  4:37 PM      Component Value Range Status Comment   Specimen Description SPUTUM   Final    Special Requests Immunocompromised   Final    Sputum evaluation     Final    Value: THIS SPECIMEN IS ACCEPTABLE. RESPIRATORY CULTURE REPORT TO FOLLOW.   Report Status 12/05/2012 FINAL   Final   CULTURE, RESPIRATORY     Status: Normal   Collection Time   12/05/12  4:37 PM      Component Value Range Status Comment   Specimen Description SPUTUM   Final    Special Requests NONE   Final    Gram Stain     Final    Value:  RARE WBC PRESENT, PREDOMINANTLY MONONUCLEAR     RARE SQUAMOUS EPITHELIAL CELLS PRESENT     RARE GRAM POSITIVE COCCI     IN PAIRS   Culture NORMAL OROPHARYNGEAL FLORA   Final    Report Status 12/08/2012 FINAL   Final   BODY FLUID CULTURE     Status: Normal   Collection Time   12/07/12  2:15 PM      Component Value Range Status Comment   Specimen Description PLEURAL   Final    Special Requests NONE   Final    Gram Stain     Final    Value: FEW WBC PRESENT, PREDOMINANTLY MONONUCLEAR     NO ORGANISMS SEEN   Culture NO GROWTH 3 DAYS   Final    Report Status 12/11/2012 FINAL   Final   CULTURE, RESPIRATORY     Status: Normal   Collection Time   12/11/12  8:09 AM      Component Value Range Status Comment   Specimen Description BRONCHIAL WASHINGS   Final    Special Requests PT ON ZOSYN   Final    Gram Stain     Final    Value: FEW WBC PRESENT,BOTH PMN AND MONONUCLEAR     FEW SQUAMOUS EPITHELIAL CELLS PRESENT     NO ORGANISMS SEEN   Culture NO GROWTH 2 DAYS   Final    Report Status 12/13/2012 FINAL   Final   BODY  FLUID CULTURE     Status: Normal   Collection Time   12/11/12  9:23 AM      Component Value Range Status Comment   Specimen Description FLUID PLEURAL RIGHT   Final    Special Requests PT ON ZOSYN   Final    Gram Stain     Final    Value: NO WBC SEEN     NO ORGANISMS SEEN     [id number]   Culture NO GROWTH 3 DAYS   Final    Report Status 12/14/2012 FINAL   Final   TISSUE CULTURE     Status: Normal   Collection Time   12/11/12  9:55 AM      Component Value Range Status Comment   Specimen Description TISSUE   Final    Special Requests PLEURAL PEEL RIGHT PT ON ZOSYN   Final    Gram Stain     Final    Value: NO WBC SEEN     NO ORGANISMS SEEN   Culture NO GROWTH 3 DAYS   Final    Report Status 12/14/2012 FINAL   Final   ANAEROBIC CULTURE     Status: Normal   Collection Time   12/11/12  9:55 AM      Component Value Range Status Comment   Specimen Description TISSUE   Final    Special Requests PLEURAL PEEL RIGHT PT ON ZOSYN   Final    Gram Stain     Final    Value: NO WBC SEEN     NO ORGANISMS SEEN   Culture NO ANAEROBES ISOLATED   Final    Report Status 12/16/2012 FINAL   Final     Anti-infectives     Start     Dose/Rate Route Frequency Ordered Stop   12/16/12 0000   amoxicillin-clavulanate (AUGMENTIN) 875-125 MG per tablet        1 tablet Oral 2 times daily 12/16/12 0949     12/16/12 0000   sodium chloride 0.9 % SOLN 500  mL with vancomycin 10 G SOLR        250 mL/hr over 120 Minutes   12/16/12 0949     12/11/12 2200   vancomycin (VANCOCIN) IVPB 1000 mg/200 mL premix  Status:  Discontinued        1,000 mg 200 mL/hr over 60 Minutes Intravenous Every 12 hours 12/11/12 1815 12/11/12 1850   12/11/12 2200   piperacillin-tazobactam (ZOSYN) IVPB 3.375 g        3.375 g 12.5 mL/hr over 240 Minutes Intravenous 3 times per day 12/11/12 1815     12/11/12 2200   ceFEPIme (MAXIPIME) 2 g in dextrose 5 % 50 mL IVPB  Status:  Discontinued        2 g 100 mL/hr over 30 Minutes Intravenous  Every 12 hours 12/11/12 1545 12/11/12 1843   12/11/12 2200   vancomycin (VANCOCIN) IVPB 1000 mg/200 mL premix  Status:  Discontinued        1,000 mg 200 mL/hr over 60 Minutes Intravenous Every 8 hours 12/11/12 1850 12/16/12 2305   12/11/12 1002   vancomycin 1500 mg in NS ( ) irrigation  Status:  Discontinued          As needed 12/11/12 1002 12/11/12 1054   12/11/12 0500   cefUROXime (ZINACEF) 1.5 g in dextrose 5 % 50 mL IVPB        1.5 g 100 mL/hr over 30 Minutes Intravenous 60 min pre-op 12/09/12 1642 12/11/12 0805   12/10/12 0900   levofloxacin (LEVAQUIN) IVPB 750 mg  Status:  Discontinued        750 mg 100 mL/hr over 90 Minutes Intravenous Every 24 hours 12/09/12 1358 12/09/12 1359   12/10/12 0900   levofloxacin (LEVAQUIN) tablet 750 mg  Status:  Discontinued        750 mg Oral Daily 12/09/12 1359 12/11/12 1815   12/08/12 1400   vancomycin (VANCOCIN) 1,500 mg in sodium chloride 0.9 % 500 mL IVPB  Status:  Discontinued        1,500 mg 250 mL/hr over 120 Minutes Intravenous Every 12 hours 12/08/12 0735 12/08/12 0813   12/05/12 2200   vancomycin (VANCOCIN) IVPB 1000 mg/200 mL premix  Status:  Discontinued        1,000 mg 200 mL/hr over 60 Minutes Intravenous Every 12 hours 12/05/12 1013 12/08/12 0735   12/05/12 1400   ceFEPIme (MAXIPIME) 2 g in dextrose 5 % 50 mL IVPB  Status:  Discontinued        2 g 100 mL/hr over 30 Minutes Intravenous 3 times per day 12/05/12 1013 12/11/12 1545   12/05/12 1100   vancomycin (VANCOCIN) 2,000 mg in sodium chloride 0.9 % 500 mL IVPB        2,000 mg 250 mL/hr over 120 Minutes Intravenous  Once 12/05/12 1013 12/05/12 1559   12/04/12 1000   levofloxacin (LEVAQUIN) tablet 500 mg  Status:  Discontinued        500 mg Oral Daily 12/04/12 0734 12/09/12 1357   12/02/12 0600   piperacillin-tazobactam (ZOSYN) IVPB 3.375 g  Status:  Discontinued        3.375 g 12.5 mL/hr over 240 Minutes Intravenous 3 times per day 12/02/12 0114 12/04/12 0734    12/02/12 0115   piperacillin-tazobactam (ZOSYN) IVPB 3.375 g        3.375 g 100 mL/hr over 30 Minutes Intravenous  Once 12/02/12 0112 12/02/12 0150   12/02/12 0115   oseltamivir (TAMIFLU) capsule 75 mg  Status:  Discontinued  75 mg Oral 2 times daily 12/02/12 0113 12/03/12 0740   12/01/12 2330   levofloxacin (LEVAQUIN) IVPB 750 mg  Status:  Discontinued        750 mg 100 mL/hr over 90 Minutes Intravenous Every 24 hours 12/01/12 2317 12/04/12 0734   12/01/12 2100   levofloxacin (LEVAQUIN) IVPB 750 mg        750 mg 100 mL/hr over 90 Minutes Intravenous  Once 12/01/12 2050 12/01/12 2250          Assessment: 51yom on Vancomycin/zosyn for RLL PNA/empyema s/p VATS, plan for 3 weeks of antibiotics treatment. Vancomycin level was 35.2 on 1/29 at 2130, and 29 on 1/30 at 0600. Patient's SCr has been increasing (0.54-->0.95-->1.15-->1.89) and UOP is declining (down to 0.4 ml/kg/hr). Based on the two levels, calculated Ke = 0.0225 and T1/2 ~ 30 hrs. Estimate vancomycin level will be cleared to ~ 17 tomorrow at 0600. Patient is afebrile, WBC wnl and cultures are negative. Zosyn and lovenox dose ok for current renal function.   Goal of Therapy:  Vancomycin trough level 15-20 mcg/ml  Plan:  1. Hold Vancomycin today 2. Random vancomycin level tomorrow with AM labs 3. If vancomycin level is decreased to 15-20, recommend to restart vancomycin 1500mg  Q 24hrs if patient will discharge home on vencomycin. 4. Monitor scr, and UOP closely.  Bayard Hugger, PharmD, BCPS  Clinical Pharmacist  Pager: [phone]   12/17/2012,8:36 AM

## 2012-12-17 NOTE — Progress Notes (Signed)
Pt amb 500 ft in hallway pushing W/C. Pt did not have any complaints.  Will continue to monitor pt.

## 2012-12-18 ENCOUNTER — Inpatient Hospital Stay (HOSPITAL_COMMUNITY): Payer: 59

## 2012-12-18 LAB — TYPE AND SCREEN
ABO/RH(D): O POS
Antibody Screen: NEGATIVE
Donor AG Type: NEGATIVE
Unit division: 0

## 2012-12-18 LAB — BASIC METABOLIC PANEL
CO2: 25 mEq/L (ref 19–32)
Calcium: 8.3 mg/dL — ABNORMAL LOW (ref 8.4–10.5)
Creatinine, Ser: 1.91 mg/dL — ABNORMAL HIGH (ref 0.50–1.35)
GFR calc non Af Amer: 39 mL/min — ABNORMAL LOW (ref >90)
Sodium: 138 mEq/L (ref 135–145)

## 2012-12-18 LAB — CBC
HCT: 24 % — ABNORMAL LOW (ref 39.0–52.0)
Hemoglobin: 7.9 g/dL — ABNORMAL LOW (ref 13.0–17.0)
MCH: 31.1 pg (ref 26.0–34.0)
MCHC: 32.9 g/dL (ref 30.0–36.0)
MCV: 94.5 fL (ref 78.0–100.0)
Platelets: 180 10*3/uL (ref 150–400)
RBC: 2.54 MIL/uL — ABNORMAL LOW (ref 4.22–5.81)
RDW: 20.5 % — ABNORMAL HIGH (ref 11.5–15.5)
WBC: 5 10*3/uL (ref 4.0–10.5)

## 2012-12-18 LAB — CLOSTRIDIUM DIFFICILE BY PCR: Toxigenic C. Difficile by PCR: NEGATIVE

## 2012-12-18 LAB — PROTIME-INR
INR: 1.4 (ref 0.00–1.49)
Prothrombin Time: 16.8 seconds — ABNORMAL HIGH (ref 11.6–15.2)

## 2012-12-18 LAB — VANCOMYCIN, RANDOM: Vancomycin Rm: 18.9 ug/mL

## 2012-12-18 MED ORDER — LUNG SURGERY BOOK
Freq: Once | Status: DC
  Filled 2012-12-18: qty 1

## 2012-12-18 MED ORDER — POTASSIUM CHLORIDE CRYS ER 20 MEQ PO TBCR
40.0000 meq | EXTENDED_RELEASE_TABLET | Freq: Two times a day (BID) | ORAL | Status: DC
  Administered 2012-12-18 – 2012-12-20 (×5): 40 meq via ORAL
  Filled 2012-12-18 (×6): qty 2

## 2012-12-18 NOTE — Progress Notes (Addendum)
                    [address]            [place] [address]          [phone]     7 Days Post-Op Procedure(s) (LRB): VIDEO BRONCHOSCOPY (N/A) VIDEO ASSISTED THORACOSCOPY (Right) DECORTICATION (N/A)  Subjective: Had 1 loose stool this am, still not eating much.  Breathing stable.   Objective: Vital signs in last 24 hours: Patient Vitals for the past 24 hrs:  BP Temp Temp src Pulse Resp SpO2  12/18/12 0504 156/79 mmHg 98 F (36.7 C) Oral 67  18  100 %  12/17/12 2055 152/73 mmHg 98.3 F (36.8 C) Oral 73  18  98 %  12/17/12 1645 172/79 mmHg - - - - -  12/17/12 1445 162/86 mmHg 98.1 F (36.7 C) Oral 73  18  -  12/17/12 1425 154/78 mmHg 98.6 F (37 C) Oral 82  20  98 %  12/17/12 1325 144/72 mmHg 98 F (36.7 C) Oral 79  20  -  12/17/12 1225 151/73 mmHg 99 F (37.2 C) Oral 78  18  -  12/17/12 1156 147/77 mmHg 99.1 F (37.3 C) Oral 79  18  -   Current Weight  12/17/12 222 lb 14.4 oz (101.107 kg)     Intake/Output from previous day: 01/30 0701 - 01/31 0700 In: 932.5 [P.O.:720; Blood:12.5; IV Piggyback:200] Out: 2275 [Urine:2275]    PHYSICAL EXAM:  Heart: RRR Lungs: Slightly decreased BS in R base Wound: Clean and dry    Lab Results: CBC: Basename 12/18/12 0600 12/17/12 0415  WBC 5.0 5.0  HGB 7.9* 7.0*  HCT 24.0* 21.0*  PLT 180 166   BMET:  Basename 12/18/12 0600 12/17/12 0415  NA 138 139  K 3.1* 3.3*  CL 107 104  CO2 25 26  GLUCOSE 102* 89  BUN 9 10  CREATININE 1.91* 1.89*  CALCIUM 8.3* 8.4    PT/INR:  Basename 12/18/12 0600  LABPROT 16.8*  INR 1.40     CXR: A left subclavian CVP is stable in position. Heart and  mediastinal contours are unchanged. Persistent right pleural  effusion is identified and is unchanged in degree. Overall low  lung volumes are noted with some persistent right lower lobe and  middle lobe volume loss. The left lung remains clear.  Bony structures are intact.  IMPRESSION:  Unchanged  cardiopulmonary appearance with stable right pleural  effusion and associated right middle and lower lobe atelectasis.    Assessment/Plan: S/P Procedure(s) (LRB): VIDEO BRONCHOSCOPY (N/A) VIDEO ASSISTED THORACOSCOPY (Right) DECORTICATION (N/A)  Pulm- off O2, CXR stable. Continue pulm toilet, IS.  ID- Cx's no growth.  Off Vanc.  Continue Cefipime x 2 weeks.  WBC stable.  CV- BPs remain elevated. Off ARB due to increased creatinine.  Continue Hydralazine, may need to add additional agent if remains hypertensive.  Acute renal insufficiency- Cr generally stable.  Continue to monitor.  Hypokalemia- replace K+.  GI- Only 1 loose stool since last night.  C diff pending.  Will watch.  Expected postop blood loss anemia- H/H stable, slightly improved after tx.  Will continue to watch.    LOS: 17 days    COLLINS,GINA H 12/18/2012   ok to send home when INR  >1.8- hx pul emboli- no DVT on recent scan Needs 2wks of home cefepime from today- 1g BID

## 2012-12-19 ENCOUNTER — Inpatient Hospital Stay (HOSPITAL_COMMUNITY): Payer: 59

## 2012-12-19 LAB — BASIC METABOLIC PANEL
BUN: 9 mg/dL (ref 6–23)
CO2: 24 mEq/L (ref 19–32)
Calcium: 8.5 mg/dL (ref 8.4–10.5)
Chloride: 105 mEq/L (ref 96–112)
Creatinine, Ser: 1.86 mg/dL — ABNORMAL HIGH (ref 0.50–1.35)

## 2012-12-19 LAB — PROTIME-INR
INR: 1.51 — ABNORMAL HIGH (ref 0.00–1.49)
Prothrombin Time: 17.8 seconds — ABNORMAL HIGH (ref 11.6–15.2)

## 2012-12-19 MED ORDER — DEXTROSE 5 % IV SOLN: INTRAVENOUS | Status: DC

## 2012-12-19 MED ORDER — HYDRALAZINE HCL 25 MG PO TABS: 25.0000 mg | ORAL_TABLET | Freq: Two times a day (BID) | ORAL | Status: DC

## 2012-12-19 NOTE — Progress Notes (Addendum)
                    [address]            [place] [address]          [phone]     8 Days Post-Op Procedure(s) (LRB): VIDEO BRONCHOSCOPY (N/A) VIDEO ASSISTED THORACOSCOPY (Right) DECORTICATION (N/A)  Subjective: Feels well, no complaints.  Breathing stable.  No more loose stools.    Objective: Vital signs in last 24 hours: Patient Vitals for the past 24 hrs:  BP Temp Temp src Pulse Resp SpO2 Weight  12/19/12 0354 132/61 mmHg 99 F (37.2 C) Oral 85  18  100 % 224 lb (101.606 kg)  12/18/12 2139 123/63 mmHg 97.4 F (36.3 C) Oral 79  17  100 % -  12/18/12 1422 149/78 mmHg 100.4 F (38 C) Oral 76  16  99 % -   Current Weight  12/19/12 224 lb (101.606 kg)     Intake/Output from previous day: 01/31 0701 - 02/01 0700 In: 840 [P.O.:840] Out: 977 [Urine:976; Stool:1]    PHYSICAL EXAM:  Heart: RRR Lungs: Decreased BS on R, overall clear Wound: Clean and dry    Lab Results: CBC: Basename 12/18/12 0600 12/17/12 0415  WBC 5.0 5.0  HGB 7.9* 7.0*  HCT 24.0* 21.0*  PLT 180 166   BMET:  Basename 12/19/12 0450 12/18/12 0600  NA 135 138  K 4.0 3.1*  CL 105 107  CO2 24 25  GLUCOSE 81 102*  BUN 9 9  CREATININE 1.86* 1.91*  CALCIUM 8.5 8.3*    PT/INR:  Basename 12/19/12 0450  LABPROT 17.8*  INR 1.51*     CXR: Findings: Stable right loculated pleural effusion or pleural  thickening. Interstitial infiltrate or atelectasis in the lung  bases right greater than left stable. Heart size normal. Left arm  PICC to the cavoatrial junction as before. Regional bones  unremarkable.  IMPRESSION:  1. Stable appearance since previous day's exam   C diff negative   Assessment/Plan: S/P Procedure(s) (LRB): VIDEO BRONCHOSCOPY (N/A) VIDEO ASSISTED THORACOSCOPY (Right) DECORTICATION (N/A) Doing well overall.  Pulm status stable. Hypokalemia- resolved. ID- Continue IV Cefipime. ARI- Cr stable. H/o PE- INR trending up. Home once INR > 1.8.    LOS: 18 days    COLLINS,GINA H 12/19/2012    Chart reviewed, patient examined, agree with above.

## 2012-12-20 LAB — PROTIME-INR
INR: 1.92 — ABNORMAL HIGH (ref 0.00–1.49)
Prothrombin Time: 21.2 seconds — ABNORMAL HIGH (ref 11.6–15.2)

## 2012-12-20 MED ORDER — HEPARIN SOD (PORK) LOCK FLUSH 100 UNIT/ML IV SOLN
250.0000 [IU] | INTRAVENOUS | Status: AC | PRN
  Administered 2012-12-20: 250 [IU]

## 2012-12-20 MED ORDER — OXYCODONE-ACETAMINOPHEN 5-325 MG PO TABS: 1.0000 | ORAL_TABLET | ORAL | Status: DC | PRN

## 2012-12-20 MED ORDER — BENZONATATE 200 MG PO CAPS: 200.0000 mg | ORAL_CAPSULE | Freq: Three times a day (TID) | ORAL | Status: DC | PRN

## 2012-12-20 MED ORDER — DEXTROSE 5 % IV SOLN: INTRAVENOUS | Status: DC

## 2012-12-20 NOTE — Progress Notes (Addendum)
                    [address]            [place] [address]          [phone]     9 Days Post-Op Procedure(s) (LRB): VIDEO BRONCHOSCOPY (N/A) VIDEO ASSISTED THORACOSCOPY (Right) DECORTICATION (N/A)  Subjective: Feels well, no complaints.   Objective: Vital signs in last 24 hours: Patient Vitals for the past 24 hrs:  BP Temp Temp src Pulse Resp SpO2  12/20/12 0400 131/67 mmHg 99 F (37.2 C) Oral 81  19  98 %  12/19/12 2001 127/61 mmHg 99.4 F (37.4 C) Oral 82  20  96 %  12/19/12 1347 111/52 mmHg 98.5 F (36.9 C) Oral 83  18  98 %   Current Weight  12/19/12 224 lb (101.606 kg)     Intake/Output from previous day: 02/01 0701 - 02/02 0700 In: -  Out: 750 [Urine:750]    PHYSICAL EXAM:  Heart: RRR Lungs: Slightly decreased in R base Wound: Clean and dry    Lab Results: CBC: Basename 12/18/12 0600  WBC 5.0  HGB 7.9*  HCT 24.0*  PLT 180   BMET:  Basename 12/19/12 0450 12/18/12 0600  NA 135 138  K 4.0 3.1*  CL 105 107  CO2 24 25  GLUCOSE 81 102*  BUN 9 9  CREATININE 1.86* 1.91*  CALCIUM 8.5 8.3*    PT/INR:  Basename 12/20/12 0500  LABPROT 21.2*  INR 1.92*      Assessment/Plan: S/P Procedure(s) (LRB): VIDEO BRONCHOSCOPY (N/A) VIDEO ASSISTED THORACOSCOPY (Right) DECORTICATION (N/A) Improving daily.   INR near therapeutic. Plan d/c home today.     LOS: 19 days    COLLINS,GINA H 12/20/2012    Chart reviewed, patient examined, agree with above.

## 2012-12-20 NOTE — Progress Notes (Signed)
NCM contacted [facility] to arrange dc home with IV abx. Faxed referral and IV abx RX to [facility]. [facility] contact info added to dc instructions. Confirmed with [facility] and soc will be 2/2 this evening for IV dose. NCM spoke to pt and explained [facility] will be out this evening to administer his second dose.  Isidoro Donning RN CCM Case Mgmt phone [phone]

## 2012-12-21 ENCOUNTER — Telehealth: Payer: Self-pay | Admitting: Internal Medicine

## 2012-12-21 ENCOUNTER — Other Ambulatory Visit: Payer: Self-pay | Admitting: *Deleted

## 2012-12-21 DIAGNOSIS — C9000 Multiple myeloma not having achieved remission: Secondary | ICD-10-CM

## 2012-12-21 LAB — POCT INR: INR: 1.68

## 2012-12-21 NOTE — Progress Notes (Signed)
Per Dr Donnald Garre, pt is to start Pomalyst in 2 weeks when he completes his IV antibiotics at home.  Pt is aware.  Onc tx schedule filled out for f/u in 2 weeks.  SLJ

## 2012-12-21 NOTE — Telephone Encounter (Signed)
LMONVM ADVIISNG THE PT OF HIS FEB 2014 APPTS WITH THE PA

## 2012-12-21 NOTE — Telephone Encounter (Signed)
THIS REFILL REQUEST FOR POMALYST WAS GIVEN TO DR.MOHAMED'S NURSE, STEPHANIE JOHNSON,RN. 

## 2012-12-21 NOTE — Discharge Summary (Signed)
patient examined and medical record reviewed,agree with above note. VAN TRIGT III,Zariah Jost 12/21/2012    

## 2012-12-22 ENCOUNTER — Encounter (HOSPITAL_COMMUNITY)
Admission: RE | Admit: 2012-12-22 | Discharge: 2012-12-22 | Disposition: A | Discharge status: Home / Self Care | Payer: 59 | Source: Ambulatory Visit | Attending: Internal Medicine | Admitting: Internal Medicine

## 2012-12-22 ENCOUNTER — Telehealth: Payer: Self-pay | Admitting: *Deleted

## 2012-12-22 ENCOUNTER — Telehealth: Payer: Self-pay | Admitting: Internal Medicine

## 2012-12-22 ENCOUNTER — Other Ambulatory Visit: Payer: Self-pay | Admitting: *Deleted

## 2012-12-22 DIAGNOSIS — T451X5A Adverse effect of antineoplastic and immunosuppressive drugs, initial encounter: Secondary | ICD-10-CM | POA: Insufficient documentation

## 2012-12-22 DIAGNOSIS — C9000 Multiple myeloma not having achieved remission: Secondary | ICD-10-CM

## 2012-12-22 DIAGNOSIS — D649 Anemia, unspecified: Secondary | ICD-10-CM

## 2012-12-22 MED ORDER — POMALIDOMIDE 4 MG PO CAPS: 4.0000 mg | ORAL_CAPSULE | Freq: Every day | ORAL | Status: DC

## 2012-12-22 NOTE — Telephone Encounter (Signed)
Lab work drawn from [facility] on 12/21/12 from [facility] shows hbg 7.4.  Per Dr Donnald Garre pt needs 2 units PRBC's.  Pt aware of appt tomorrow 12/23/12.  Will give copy of lab work to pharmacy to show them PT/INR level.  SLJ

## 2012-12-22 NOTE — Telephone Encounter (Signed)
S/w the pt and he is aware of the appts on 01/06/2013

## 2012-12-22 NOTE — Progress Notes (Signed)
Blood orders placed for 12/23/12.  Pt aware of appt.

## 2012-12-23 ENCOUNTER — Ambulatory Visit: Payer: Self-pay | Admitting: Pharmacist

## 2012-12-23 ENCOUNTER — Other Ambulatory Visit: Payer: 59 | Admitting: Lab

## 2012-12-23 ENCOUNTER — Other Ambulatory Visit: Payer: Self-pay | Admitting: *Deleted

## 2012-12-23 ENCOUNTER — Ambulatory Visit (HOSPITAL_BASED_OUTPATIENT_CLINIC_OR_DEPARTMENT_OTHER): Payer: 59

## 2012-12-23 VITALS — BP 128/81 | HR 70 | Temp 97.0°F | Resp 18

## 2012-12-23 DIAGNOSIS — C9000 Multiple myeloma not having achieved remission: Secondary | ICD-10-CM

## 2012-12-23 DIAGNOSIS — D649 Anemia, unspecified: Secondary | ICD-10-CM

## 2012-12-23 DIAGNOSIS — I776 Arteritis, unspecified: Secondary | ICD-10-CM

## 2012-12-23 LAB — PREPARE RBC (CROSSMATCH)

## 2012-12-23 MED ORDER — POMALIDOMIDE 4 MG PO CAPS: 4.0000 mg | ORAL_CAPSULE | Freq: Every day | ORAL | Status: DC

## 2012-12-23 MED ORDER — SODIUM CHLORIDE 0.9 % IV SOLN
250.0000 mL | Freq: Once | INTRAVENOUS | Status: AC
  Administered 2012-12-23: 250 mL via INTRAVENOUS

## 2012-12-23 MED ORDER — ACETAMINOPHEN 325 MG PO TABS
650.0000 mg | ORAL_TABLET | Freq: Once | ORAL | Status: AC
  Administered 2012-12-23: 650 mg via ORAL

## 2012-12-23 MED ORDER — DIPHENHYDRAMINE HCL 50 MG/ML IJ SOLN
25.0000 mg | Freq: Once | INTRAMUSCULAR | Status: AC
  Administered 2012-12-23: 25 mg via INTRAVENOUS

## 2012-12-23 NOTE — Progress Notes (Signed)
INR from 12/21/12 subtherapeutic (1.68) on 5mg  daily. Pt discharged this week after 5 week-long hospital admission for pneumonia. No problem with bleeding or bruising.  Received antibiotics during admission.  Pt remains on cefepime IV BID x 14 days.  He started this regimen on 12/20/12.    Spoke with patient in infusion area while receiving PRBC.  Instructed him to take 7.5mg  today, then resume 5mg  daily.  (The last dose at which his INR had been previously stable was 5mg  daily except 7.5mg  on MWF.) Will recheck INR in 1 week.

## 2012-12-23 NOTE — Telephone Encounter (Signed)
Left VM reminding patient to complete his survey. Do not donate blood or semen and always wear condom having sex with male who could become pregnant. Refaxed script to [facility] after prescriber survey completed with authorization [id number]

## 2012-12-23 NOTE — Patient Instructions (Addendum)
Blood Transfusion Information WHAT IS A BLOOD TRANSFUSION? A transfusion is the replacement of blood or some of its parts. Blood is made up of multiple cells which provide different functions.  Red blood cells carry oxygen and are used for blood loss replacement.  White blood cells fight against infection.  Platelets control bleeding.  Plasma helps clot blood.  Other blood products are available for specialized needs, such as hemophilia or other clotting disorders. BEFORE THE TRANSFUSION  Who gives blood for transfusions?   You may be able to donate blood to be used at a later date on yourself (autologous donation).  Relatives can be asked to donate blood. This is generally not any safer than if you have received blood from a stranger. The same precautions are taken to ensure safety when a relative's blood is donated.  Healthy volunteers who are fully evaluated to make sure their blood is safe. This is blood bank blood. Transfusion therapy is the safest it has ever been in the practice of medicine. Before blood is taken from a donor, a complete history is taken to make sure that person has no history of diseases nor engages in risky social behavior (examples are intravenous drug use or sexual activity with multiple partners). The donor's travel history is screened to minimize risk of transmitting infections, such as malaria. The donated blood is tested for signs of infectious diseases, such as HIV and hepatitis. The blood is then tested to be sure it is compatible with you in order to minimize the chance of a transfusion reaction. If you or a relative donates blood, this is often done in anticipation of surgery and is not appropriate for emergency situations. It takes many days to process the donated blood. RISKS AND COMPLICATIONS Although transfusion therapy is very safe and saves many lives, the main dangers of transfusion include:   Getting an infectious disease.  Developing a  transfusion reaction. This is an allergic reaction to something in the blood you were given. Every precaution is taken to prevent this. The decision to have a blood transfusion has been considered carefully by your caregiver before blood is given. Blood is not given unless the benefits outweigh the risks. AFTER THE TRANSFUSION  Right after receiving a blood transfusion, you will usually feel much better and more energetic. This is especially true if your red blood cells have gotten low (anemic). The transfusion raises the level of the red blood cells which carry oxygen, and this usually causes an energy increase.  The nurse administering the transfusion will monitor you carefully for complications. HOME CARE INSTRUCTIONS  No special instructions are needed after a transfusion. You may find your energy is better. Speak with your caregiver about any limitations on activity for underlying diseases you may have. SEEK MEDICAL CARE IF:   Your condition is not improving after your transfusion.  You develop redness or irritation at the intravenous (IV) site. SEEK IMMEDIATE MEDICAL CARE IF:  Any of the following symptoms occur over the next 12 hours:  Shaking chills.  You have a temperature by mouth above 102 F (38.9 C), not controlled by medicine.  Chest, back, or muscle pain.  People around you feel you are not acting correctly or are confused.  Shortness of breath or difficulty breathing.  Dizziness and fainting.  You get a rash or develop hives.  You have a decrease in urine output.  Your urine turns a dark color or changes to pink, red, or brown. Any of the following   symptoms occur over the next 10 days:  You have a temperature by mouth above 102 F (38.9 C), not controlled by medicine.  Shortness of breath.  Weakness after normal activity.  The white part of the eye turns yellow (jaundice).  You have a decrease in the amount of urine or are urinating less often.  Your  urine turns a dark color or changes to pink, red, or brown. Document Released: 11/01/2000 Document Revised: 01/27/2012 Document Reviewed: 06/20/2008 Exit[employer] Patient Information 2013 [employer].  

## 2012-12-24 LAB — TYPE AND SCREEN
Donor AG Type: NEGATIVE
Unit division: 0

## 2012-12-28 ENCOUNTER — Other Ambulatory Visit: Payer: Self-pay | Admitting: *Deleted

## 2012-12-29 ENCOUNTER — Other Ambulatory Visit: Payer: 59 | Admitting: Lab

## 2012-12-29 ENCOUNTER — Ambulatory Visit: Payer: 59 | Admitting: Physician Assistant

## 2012-12-29 ENCOUNTER — Other Ambulatory Visit: Payer: Self-pay

## 2012-12-29 DIAGNOSIS — G8918 Other acute postprocedural pain: Secondary | ICD-10-CM

## 2012-12-29 DIAGNOSIS — G629 Polyneuropathy, unspecified: Secondary | ICD-10-CM

## 2012-12-29 MED ORDER — HYDROCODONE-ACETAMINOPHEN 10-325 MG PO TABS: 1.0000 | ORAL_TABLET | Freq: Four times a day (QID) | ORAL | Status: DC | PRN

## 2012-12-29 NOTE — Telephone Encounter (Signed)
Rx refill for Norco 10/325 mg #40 no refills sent to [facility] per pt's request

## 2012-12-30 ENCOUNTER — Ambulatory Visit
Admission: RE | Admit: 2012-12-30 | Discharge: 2012-12-30 | Disposition: A | Discharge status: Home / Self Care | Payer: 59 | Source: Ambulatory Visit | Attending: Cardiothoracic Surgery | Admitting: Cardiothoracic Surgery

## 2012-12-30 ENCOUNTER — Encounter: Payer: Self-pay | Admitting: Cardiothoracic Surgery

## 2012-12-30 ENCOUNTER — Ambulatory Visit (HOSPITAL_BASED_OUTPATIENT_CLINIC_OR_DEPARTMENT_OTHER): Payer: 59 | Admitting: Pharmacist

## 2012-12-30 ENCOUNTER — Other Ambulatory Visit: Payer: Self-pay | Admitting: *Deleted

## 2012-12-30 ENCOUNTER — Ambulatory Visit (INDEPENDENT_AMBULATORY_CARE_PROVIDER_SITE_OTHER): Payer: Self-pay | Admitting: Cardiothoracic Surgery

## 2012-12-30 ENCOUNTER — Ambulatory Visit (HOSPITAL_BASED_OUTPATIENT_CLINIC_OR_DEPARTMENT_OTHER): Payer: 59 | Admitting: Lab

## 2012-12-30 ENCOUNTER — Other Ambulatory Visit: Payer: Self-pay | Admitting: Medical Oncology

## 2012-12-30 VITALS — BP 140/87 | HR 106 | Resp 20 | Ht 63.0 in | Wt 218.0 lb

## 2012-12-30 DIAGNOSIS — Z09 Encounter for follow-up examination after completed treatment for conditions other than malignant neoplasm: Secondary | ICD-10-CM

## 2012-12-30 DIAGNOSIS — I776 Arteritis, unspecified: Secondary | ICD-10-CM

## 2012-12-30 DIAGNOSIS — G8918 Other acute postprocedural pain: Secondary | ICD-10-CM

## 2012-12-30 DIAGNOSIS — J90 Pleural effusion, not elsewhere classified: Secondary | ICD-10-CM

## 2012-12-30 DIAGNOSIS — Z7901 Long term (current) use of anticoagulants: Secondary | ICD-10-CM

## 2012-12-30 DIAGNOSIS — C9000 Multiple myeloma not having achieved remission: Secondary | ICD-10-CM

## 2012-12-30 DIAGNOSIS — J869 Pyothorax without fistula: Secondary | ICD-10-CM

## 2012-12-30 LAB — PROTIME-INR
INR: 2.9 (ref 2.00–3.50)
Protime: 34.8 Seconds — ABNORMAL HIGH (ref 10.6–13.4)

## 2012-12-30 LAB — CBC WITH DIFFERENTIAL/PLATELET
BASO%: 0.6 % (ref 0.0–2.0)
EOS%: 1.2 % (ref 0.0–7.0)
HCT: 30.1 % — ABNORMAL LOW (ref 38.4–49.9)
LYMPH%: 25.7 % (ref 14.0–49.0)
MCH: 30.8 pg (ref 27.2–33.4)
MCHC: 34.1 g/dL (ref 32.0–36.0)
NEUT%: 59.1 % (ref 39.0–75.0)
lymph#: 1.4 10*3/uL (ref 0.9–3.3)

## 2012-12-30 LAB — COMPREHENSIVE METABOLIC PANEL (CC13)
AST: 15 U/L (ref 5–34)
Alkaline Phosphatase: 48 U/L (ref 40–150)
BUN: 19.5 mg/dL (ref 7.0–26.0)
Calcium: 10 mg/dL (ref 8.4–10.4)
Creatinine: 2.3 mg/dL — ABNORMAL HIGH (ref 0.7–1.3)
Total Bilirubin: 0.4 mg/dL (ref 0.20–1.20)

## 2012-12-30 MED ORDER — OXYCODONE-ACETAMINOPHEN 5-325 MG PO TABS: 1.0000 | ORAL_TABLET | Freq: Four times a day (QID) | ORAL | Status: DC | PRN

## 2012-12-30 NOTE — Progress Notes (Signed)
Pt weak today. Recovering from double pneumonia. S/P VATS with drainage and decortication for empyema. He will have the stitches removed from his back today by Dr. Donata Clay. Pt took coumadin as directed last week. He thinks his appetite has been increasing over the last week. He will continue on Cefepime until 01/04/13. Since INR is at the upper end of goal and he will be on antibiotics for another week, I will slightly decrease coumadin to 5mg  daily except 2.5mg  on Wed & Sun. Recheck INR in 1 week with scheduled lab and [facility] apt on 01/06/13; lab at 2:15pm, coumadin clinic at 2:30pm and [facility] apt at 2:45pm. We can adjust coumadin dose as needed at next visit once antibiotics are completed.

## 2012-12-30 NOTE — Progress Notes (Signed)
PCP is [name], MD Referring Provider is Elfredia Nevins, MD  Chief Complaint  Patient presents with  . Routine Post Op    F/U from surgery with CXR, S/P Bronch, Rt VATS, drainage of empyema and decortication of rt lower lobe on 12/11/12     HPI: Doing well after right VATS with drainage and decortication for empyema. Chronically immunosuppressed state for chemotherapy to treat myeloma. Operative cultures negative. Patient still receiving outpatient twice a day IV antibiotic (Maxipime). Incision well-healed-appropriate postop pain from thoracotomy is improving  We discussed return to work date approximately late March And return to work paper provided.  Past Medical History  Diagnosis Date  . Multiple myeloma(203.0) 09/24/2011  . Hypertension     Past Surgical History  Procedure Laterality Date  . Video bronchoscopy  12/11/2012    Procedure: VIDEO BRONCHOSCOPY;  Surgeon: Kerin Perna, MD;  Location: [facility] OR;  Service: Thoracic;  Laterality: N/A;  . Video assisted thoracoscopy  12/11/2012    Procedure: VIDEO ASSISTED THORACOSCOPY;  Surgeon: Kerin Perna, MD;  Location: [facility] OR;  Service: Thoracic;  Laterality: Right;  . Decortication  12/11/2012    Procedure: DECORTICATION;  Surgeon: Kerin Perna, MD;  Location: [facility] OR;  Service: Thoracic;  Laterality: N/A;    Family History  Problem Relation Age of Onset  . Diabetic kidney disease Mother   . Hypertension Mother   . Hypertension Father   . Kidney failure Father     Social History History  Substance Use Topics  . Smoking status: Never Smoker   . Smokeless tobacco: Never Used  . Alcohol Use: No    Current Outpatient Prescriptions  Medication Sig Dispense Refill  . acetaminophen (TYLENOL) 500 MG tablet Take 1,000 mg by mouth every 6 (six) hours as needed. For pain/fever.      . benzonatate (TESSALON) 200 MG capsule Take 1 capsule (200 mg total) by mouth 3 (three) times daily as needed for cough.  30 capsule  1   . cyclophosphamide (CYTOXAN) 50 MG tablet Take 50 mg by mouth every other day.       [name] dexamethasone (DECADRON) 4 MG tablet Take 40 mg by mouth every 7 (seven) days.       [name] dextrose 5 % SOLN 50 mL with ceFEPIme 1 G SOLR 1g IV every 12 hours x 14 days  28 g  0  . gabapentin (NEURONTIN) 300 MG capsule Take 600 mg by mouth 3 (three) times daily.      . irbesartan-hydrochlorothiazide (AVALIDE) 300-12.5 MG per tablet Take 1 tablet by mouth daily with breakfast.      . iron polysaccharides (NIFEREX) 150 MG capsule Take 1 capsule (150 mg total) by mouth daily.  30 capsule  1  . oxyCODONE-acetaminophen (PERCOCET/ROXICET) 5-325 MG per tablet Take 1 tablet by mouth every 6 (six) hours as needed for pain.  40 tablet  0  . pomalidomide (POMALYST) 4 MG capsule Take 1 capsule (4 mg total) by mouth daily. Take with water on days 1-21. Repeat every 28 days.  21 capsule  0  . warfarin (COUMADIN) 5 MG tablet Take 5 mg by mouth daily.      [name] HYDROcodone-acetaminophen (NORCO) 10-325 MG per tablet Take 1-2 tablets by mouth every 6 (six) hours as needed for pain. For pain.  40 tablet  0   No current facility-administered medications for this visit.    Allergies  Allergen Reactions  . Red Dye Anaphylaxis    Lips swollen  3-4 times their baseline size  . Other Other (See Comments)    Strawberries "anything containing red dye"    Review of Systems no fever improved strength appetite persistent postthoracotomy pain-appropriate  BP 140/87  Pulse 106  Resp 20  Ht 5\' 3"  (1.6 m)  Wt 218 lb (98.884 kg)  BMI 38.63 kg/m2  SpO2 98% Physical Exam Alert and comfortable Breath sounds clear bilaterally incision is well-healed Chest tube sutures removed Cardiac rhythm regular No edema   Diagnostic Tests: Chest x-ray clear some pleural thickening at the right bas  Impression: Doing well after right VATS with drainage and decortication for empyema. Finish IV home antibiotics than EC PICC line. Return with chest  x-ray in 3 weeks. Prescription for pain medication-Percocet, tramadol provided  Plan: Return to clinic in 3 weeks with chest x-ray

## 2012-12-30 NOTE — Telephone Encounter (Signed)
Called wife and left message that pt norco rx is ready for pickup

## 2012-12-30 NOTE — Patient Instructions (Signed)
Decrease coumadin slightly to 5mg  daily except 2.5mg  on Wed & Sun. Recheck INR in 1 week with scheduled lab and [facility] apt on 01/06/13; lab at 2:15pm, coumadin clinic at 2:30pm and [facility] apt at 2:45pm.

## 2013-01-06 ENCOUNTER — Other Ambulatory Visit: Payer: 59

## 2013-01-06 ENCOUNTER — Telehealth: Payer: Self-pay | Admitting: Internal Medicine

## 2013-01-06 ENCOUNTER — Encounter: Payer: Self-pay | Admitting: Physician Assistant

## 2013-01-06 ENCOUNTER — Ambulatory Visit: Payer: 59 | Admitting: Pharmacist

## 2013-01-06 ENCOUNTER — Ambulatory Visit (HOSPITAL_BASED_OUTPATIENT_CLINIC_OR_DEPARTMENT_OTHER): Payer: 59 | Admitting: Physician Assistant

## 2013-01-06 VITALS — BP 144/86 | HR 94 | Temp 97.1°F | Resp 20 | Ht 63.0 in | Wt 222.6 lb

## 2013-01-06 DIAGNOSIS — C9000 Multiple myeloma not having achieved remission: Secondary | ICD-10-CM

## 2013-01-06 DIAGNOSIS — I776 Arteritis, unspecified: Secondary | ICD-10-CM

## 2013-01-06 LAB — CBC WITH DIFFERENTIAL/PLATELET
Basophils Absolute: 0 10*3/uL (ref 0.0–0.1)
EOS%: 1.3 % (ref 0.0–7.0)
HGB: 8.8 g/dL — ABNORMAL LOW (ref 13.0–17.1)
MCH: 30.8 pg (ref 27.2–33.4)
NEUT#: 2.7 10*3/uL (ref 1.5–6.5)
RDW: 18.8 % — ABNORMAL HIGH (ref 11.0–14.6)
lymph#: 1.6 10*3/uL (ref 0.9–3.3)

## 2013-01-06 LAB — COMPREHENSIVE METABOLIC PANEL (CC13)
ALT: 7 U/L (ref 0–55)
AST: 14 U/L (ref 5–34)
Albumin: 2.1 g/dL — ABNORMAL LOW (ref 3.5–5.0)
BUN: 29.5 mg/dL — ABNORMAL HIGH (ref 7.0–26.0)
Calcium: 9.5 mg/dL (ref 8.4–10.4)
Chloride: 103 mEq/L (ref 98–107)
Potassium: 4.1 mEq/L (ref 3.5–5.1)
Total Protein: 11.8 g/dL — ABNORMAL HIGH (ref 6.4–8.3)

## 2013-01-06 LAB — PROTIME-INR: INR: 2.1 (ref 2.00–3.50)

## 2013-01-06 LAB — LACTATE DEHYDROGENASE (CC13): LDH: 267 U/L — ABNORMAL HIGH (ref 125–245)

## 2013-01-06 NOTE — Patient Instructions (Addendum)
Continue coumadin 5mg  daily except 2.5mg  on Wed & Sun. Recheck INR in a few weeks with scheduled lab and [facility] apt on 01/18/13; lab at 3pm, coumadin clinic at 4:00pm and [facility] apt at 3:30pm.

## 2013-01-06 NOTE — Patient Instructions (Addendum)
Resume your Pomalyst, Cytoxan and Decadron in one week Follow up in 3 weeks

## 2013-01-06 NOTE — Progress Notes (Signed)
Pt has no changes to report. Therapeutic today at 2.1 on 5mg  daily with 2.5 mg on Wed and Sun.

## 2013-01-06 NOTE — Telephone Encounter (Signed)
gv and printed appt schedule for March...per AJ cancel appts on 3.3.14

## 2013-01-08 LAB — KAPPA/LAMBDA LIGHT CHAINS
Kappa free light chain: 748 mg/dL — ABNORMAL HIGH (ref 0.33–1.94)
Kappa:Lambda Ratio: 755.56 — ABNORMAL HIGH (ref 0.26–1.65)
Lambda Free Lght Chn: 0.99 mg/dL (ref 0.57–2.63)

## 2013-01-08 LAB — SPEP & IFE WITH QIG
Alpha-1-Globulin: 3.5 % (ref 2.9–4.9)
Beta 2: 2.1 % — ABNORMAL LOW (ref 3.2–6.5)
Beta Globulin: 4.9 % (ref 4.7–7.2)
Gamma Globulin: 47.6 % — ABNORMAL HIGH (ref 11.1–18.8)
IgM, Serum: <5 mg/dL — ABNORMAL LOW (ref 41–251)

## 2013-01-08 NOTE — Progress Notes (Signed)
[facility] Telephone:([phone]   Fax:([phone]) [phone]  OFFICE PROGRESS NOTE  [name], MD [address] [place] [address]  Principle Diagnosis: #1 recurrent multiple myeloma IgG kappa subtype diagnosed in June of 2008 #2 history of vasculitis and thrombosis of skin lesions   Prior Therapy: #1 status post palliative radiotherapy to the left hip under the care of Dr. Roselind Messier  #2 status post 5 cycles of systemic chemotherapy with Revlimid and low dose Decadron. Last dose given June 2009 with good response.  #3 status post autologous peripheral blood stem cell transplant at [facility] on 02/02/2008.  #4 the patient had evidence for disease recurrence in December 2010.  #5 Revlimid 25 mg by mouth daily for 21 days every 4 weeks in addition to Decadron 40 mg orally on a weekly basis. The patient is status post 28 cycles, discontinued today secondary to disease progression.  #6 Systemic chemotherapy with Velcade 1.3 mg/M2 on days 1, 4, 8 and 11 in addition to Doxil 30 mg/M2 on day 4 and Decadron 40 mg by mouth on weekly basis every 3 weeks. Status post 3 cycles, last dose was given 05/04/2012 discontinued secondary to intolerance.   Current therapy:  Zometa 4 mg IV given every 3 months.  Salvage therapy treatment with the Pomalyst 4 mg by mouth daily for 21 days every 28 days as well as Cytoxan 50 mg by mouth every other day and dexamethasone 40 mg by mouth once weekly. Therapy beginning 09/25/2012, status post 2 cycles   INTERVAL HISTORY: [name] 52 y.o. male returns to the clinic today for a hospital followup visit. He is accompanied by his wife today. He had his chest tubes removed the right side by Dr. Maren Beach. He has a followup appointment with Dr. Maren Beach on 01/20/2013. He reports that he only took 4 tablets out of the last cycle of Pomalyst prior to his hospitalization. He still feels a little weak related to his recent  hospitalization for pneumonia.  He's had no problems with bleeding or bruising. He denied having any nausea or vomiting. He denied having any significant chest pain, shortness breath, hemoptysis.  He does continue to have some numbness in his feet.   MEDICAL HISTORY: Past Medical History  Diagnosis Date  . Multiple myeloma(203.0) 09/24/2011  . Hypertension     ALLERGIES:  is allergic to red dye and other.  MEDICATIONS:  Current Outpatient Prescriptions  Medication Sig Dispense Refill  . acetaminophen (TYLENOL) 500 MG tablet Take 1,000 mg by mouth every 6 (six) hours as needed. For pain/fever.      . benzonatate (TESSALON) 200 MG capsule Take 1 capsule (200 mg total) by mouth 3 (three) times daily as needed for cough.  30 capsule  1  . cyclophosphamide (CYTOXAN) 50 MG tablet Take 50 mg by mouth every other day.       [name] dexamethasone (DECADRON) 4 MG tablet Take 40 mg by mouth every 7 (seven) days.       [name] dextrose 5 % SOLN 50 mL with ceFEPIme 1 G SOLR 1g IV every 12 hours x 14 days  28 g  0  . gabapentin (NEURONTIN) 300 MG capsule Take 600 mg by mouth 3 (three) times daily.      [name] HYDROcodone-acetaminophen (NORCO) 10-325 MG per tablet Take 1-2 tablets by mouth every 6 (six) hours as needed for pain. For pain.  40 tablet  0  . irbesartan-hydrochlorothiazide (  AVALIDE) 300-12.5 MG per tablet Take 1 tablet by mouth daily with breakfast.      . iron polysaccharides (NIFEREX) 150 MG capsule Take 1 capsule (150 mg total) by mouth daily.  30 capsule  1  . oxyCODONE-acetaminophen (PERCOCET/ROXICET) 5-325 MG per tablet Take 1 tablet by mouth every 6 (six) hours as needed for pain.  40 tablet  0  . pomalidomide (POMALYST) 4 MG capsule Take 1 capsule (4 mg total) by mouth daily. Take with water on days 1-21. Repeat every 28 days.  21 capsule  0  . traMADol (ULTRAM) 50 MG tablet 1 tablet.      . warfarin (COUMADIN) 5 MG tablet Take 5 mg by mouth daily.       No current facility-administered medications  for this visit.    REVIEW OF SYSTEMS:  Pertinent items are noted in HPI.   PHYSICAL EXAMINATION: General appearance: alert, cooperative and no distress Head: Normocephalic, without obvious abnormality, atraumatic Neck: no adenopathy Lymph nodes: Cervical, supraclavicular, and axillary nodes normal. Resp: clear to auscultation bilaterally Cardio: regular rate and rhythm, S1, S2 normal, no murmur, click, rub or gallop GI: soft, non-tender; bowel sounds normal; no masses,  no organomegaly Extremities: extremities normal, atraumatic, no cyanosis or edema Neurologic: Alert and oriented X 3, normal strength and tone. Normal symmetric reflexes. Normal coordination and gait Right lateral chest wall with occlusive dressing with 3 distinct areas of gauze which are clean and dry, no active bleeding or oozing of any serosanguineous or purulent material  ECOG PERFORMANCE STATUS: 1 - Symptomatic but completely ambulatory  Blood pressure 144/86, pulse 94, temperature 97.1 F (36.2 C), temperature source Oral, resp. rate 20, height 5\' 3"  (1.6 m), weight 222 lb 9.6 oz (100.971 kg).  LABORATORY DATA: Lab Results  Component Value Date   WBC 5.0 01/06/2013   HGB 8.8* 01/06/2013   HCT 25.9* 01/06/2013   MCV 90.6 01/06/2013   PLT 124* 01/06/2013      Chemistry      Component Value Date/Time   NA 132* 01/06/2013 1426   NA 135 12/19/2012 0450   K 4.1 01/06/2013 1426   K 4.0 12/19/2012 0450   CL 103 01/06/2013 1426   CL 105 12/19/2012 0450   CO2 22 01/06/2013 1426   CO2 24 12/19/2012 0450   BUN 29.5* 01/06/2013 1426   BUN 9 12/19/2012 0450   CREATININE 2.9* 01/06/2013 1426   CREATININE 1.86* 12/19/2012 0450      Component Value Date/Time   CALCIUM 9.5 01/06/2013 1426   CALCIUM 8.5 12/19/2012 0450   ALKPHOS 42 01/06/2013 1426   ALKPHOS 38* 12/13/2012 0401   AST 14 01/06/2013 1426   AST 19 12/13/2012 0401   ALT 7 01/06/2013 1426   ALT 12 12/13/2012 0401   BILITOT 0.38 01/06/2013 1426   BILITOT 0.4 12/13/2012 0401        RADIOGRAPHIC STUDIES: Dg Chest 2 View  09/04/2012  *RADIOLOGY REPORT*  Clinical Data: Cough.  Bacteremia.  Evaluate for pneumonia.  CHEST - 2 VIEW  Comparison: Chest x-ray 09/02/2012.  Findings: There is a new area of airspace consolidation in the right upper lobe, concerning for right upper lobe pneumonia.  Lungs otherwise appear clear.  No definite pleural effusions.  Pulmonary vasculature and the cardiomediastinal silhouette are within normal limits.  IMPRESSION: 1.  New airspace consolidation in the right upper lobe concerning for developing right upper lobe pneumonia.   Original Report Authenticated By: Florencia Reasons, M.D.  Dg Chest 2 View  09/02/2012  *RADIOLOGY REPORT*  Clinical Data: Headache, cough, fever  CHEST - 2 VIEW  Comparison: 05/29/2012  Findings: Cardiomediastinal silhouette is stable.  No acute infiltrate or pleural effusion.  No pulmonary edema.  Mild degenerative changes lower thoracic spine.  IMPRESSION: No active disease.  Mild degenerative changes lower thoracic spine.   Original Report Authenticated By: Natasha Mead, M.D.    Ct Head Wo Contrast  09/03/2012  *RADIOLOGY REPORT*  Clinical Data: Headache, altered mental status.  CT HEAD WITHOUT CONTRAST  Technique:  Contiguous axial images were obtained from the base of the skull through the vertex without contrast.  Comparison: May 08, 2007.  Findings: Diffuse lucencies are noted in the calvarium which are improved compared to the prior exam and are consistent with history of multiple myeloma.  Mild frontal atrophy is noted which is unchanged compared to prior exam.  No mass effect or midline shift is noted.  Ventricular size is within normal limits.  There is no evidence of mass lesion, hemorrhage or acute infarction.  IMPRESSION: Lucencies noted throughout the calvarium on prior exam appear to be significantly improved consistent with the given history of multiple myeloma.  No acute intracranial abnormality is noted.    Original Report Authenticated By: Venita Sheffield., M.D.     ASSESSMENT/PLAN: This is a very pleasant 52 years old [place] American male with history of progressive multiple myeloma. He is currently being treated with salvage chemotherapy consisting of,Pomalyst 4 mg by mouth daily for 21 days every 28 days as well as Cytoxan 50 mg by mouth every other day and dexamethasone 40 mg by mouth once weekly. He is status post 2 cycles.  The patient was discussed with Dr. Arbutus Ped. We will have him wait another week and then have him resume his therapy on the current doses of Pomalyst, Cytoxan and dexamethasone. He'll followup in 3 weeks with a repeat CBC differential, C. met [facility] for another symptom management visit.  Patient voiced understanding.   [name], [name] Maher E, PA-C   All questions were answered. The patient knows to call the clinic with any problems, questions or concerns. We can certainly see the patient much sooner if necessary.

## 2013-01-11 ENCOUNTER — Other Ambulatory Visit: Payer: Self-pay | Admitting: *Deleted

## 2013-01-11 ENCOUNTER — Encounter: Payer: Self-pay | Admitting: Physician Assistant

## 2013-01-11 ENCOUNTER — Telehealth: Payer: Self-pay | Admitting: Internal Medicine

## 2013-01-11 ENCOUNTER — Other Ambulatory Visit: Payer: Self-pay | Admitting: Physician Assistant

## 2013-01-11 DIAGNOSIS — C9000 Multiple myeloma not having achieved remission: Secondary | ICD-10-CM

## 2013-01-11 DIAGNOSIS — G629 Polyneuropathy, unspecified: Secondary | ICD-10-CM

## 2013-01-11 MED ORDER — GABAPENTIN 300 MG PO CAPS: 300.0000 mg | ORAL_CAPSULE | Freq: Three times a day (TID) | ORAL | Status: DC

## 2013-01-11 NOTE — Progress Notes (Signed)
Some patients to inform him of his elevated creatinine level. Advised him not to resume his Pomalyst  on 01/13/2013 as previously advised. He is to come instead for repeat stat BMET. If the creatinine level remains elevated he will remain off of his Pomalyst and Cytoxan and be referred to a nephrologist. The patient was adamant that he did not want to go on dialysis. He states that his father was on dialysis and he saw what it only took on him and this is not what he wants for himself.  Laural Benes, Hollynn Garno E, PA-C

## 2013-01-11 NOTE — Telephone Encounter (Signed)
S/w the pt and he is aware of his lab appt on 01/13/2013.

## 2013-01-13 ENCOUNTER — Other Ambulatory Visit (HOSPITAL_BASED_OUTPATIENT_CLINIC_OR_DEPARTMENT_OTHER): Payer: 59 | Admitting: Lab

## 2013-01-13 ENCOUNTER — Telehealth: Payer: Self-pay | Admitting: *Deleted

## 2013-01-13 DIAGNOSIS — C9000 Multiple myeloma not having achieved remission: Secondary | ICD-10-CM

## 2013-01-13 LAB — COMPREHENSIVE METABOLIC PANEL (CC13)
Albumin: 2.5 g/dL — ABNORMAL LOW (ref 3.5–5.0)
Alkaline Phosphatase: 36 U/L — ABNORMAL LOW (ref 40–150)
BUN: 27.4 mg/dL — ABNORMAL HIGH (ref 7.0–26.0)
Calcium: 10.2 mg/dL (ref 8.4–10.4)
Chloride: 101 mEq/L (ref 98–107)
Glucose: 93 mg/dl (ref 70–99)
Potassium: 3.9 mEq/L (ref 3.5–5.1)
Sodium: 134 mEq/L — ABNORMAL LOW (ref 136–145)
Total Protein: 12 g/dL — ABNORMAL HIGH (ref 6.4–8.3)

## 2013-01-13 LAB — CBC WITH DIFFERENTIAL/PLATELET
Basophils Absolute: 0 10*3/uL (ref 0.0–0.1)
EOS%: 0.7 % (ref 0.0–7.0)
Eosinophils Absolute: 0 10*3/uL (ref 0.0–0.5)
HCT: 25.7 % — ABNORMAL LOW (ref 38.4–49.9)
HGB: 8.8 g/dL — ABNORMAL LOW (ref 13.0–17.1)
LYMPH%: 25 % (ref 14.0–49.0)
MCH: 30.7 pg (ref 27.2–33.4)
MCV: 90 fL (ref 79.3–98.0)
MONO%: 16.2 % — ABNORMAL HIGH (ref 0.0–14.0)
NEUT%: 57.5 % (ref 39.0–75.0)
Platelets: 124 10*3/uL — ABNORMAL LOW (ref 140–400)
RDW: 19 % — ABNORMAL HIGH (ref 11.0–14.6)

## 2013-01-13 NOTE — Telephone Encounter (Signed)
Per Dr Donnald Garre, pt needs to be referred to nephrology for a cr level 3.3.  Pt verbalized understanding regarding needing a referral.  SLJ

## 2013-01-14 ENCOUNTER — Telehealth: Payer: Self-pay | Admitting: Internal Medicine

## 2013-01-18 ENCOUNTER — Other Ambulatory Visit (HOSPITAL_BASED_OUTPATIENT_CLINIC_OR_DEPARTMENT_OTHER): Payer: 59 | Admitting: Lab

## 2013-01-18 ENCOUNTER — Ambulatory Visit: Payer: 59 | Admitting: Internal Medicine

## 2013-01-18 ENCOUNTER — Other Ambulatory Visit: Payer: 59 | Admitting: Lab

## 2013-01-18 ENCOUNTER — Other Ambulatory Visit: Payer: Self-pay | Admitting: *Deleted

## 2013-01-18 ENCOUNTER — Ambulatory Visit (HOSPITAL_BASED_OUTPATIENT_CLINIC_OR_DEPARTMENT_OTHER): Payer: 59 | Admitting: Pharmacist

## 2013-01-18 DIAGNOSIS — I2510 Atherosclerotic heart disease of native coronary artery without angina pectoris: Secondary | ICD-10-CM

## 2013-01-18 DIAGNOSIS — I776 Arteritis, unspecified: Secondary | ICD-10-CM

## 2013-01-18 LAB — PROTIME-INR: INR: 1.4 — ABNORMAL LOW (ref 2.00–3.50)

## 2013-01-18 NOTE — Progress Notes (Signed)
INR below goal.  Pt states that he missed Coumadin dose last night, but this should not effect INR this morning.  Will increase Coumadin to 5mg  daily and check PT/INR next week.

## 2013-01-19 ENCOUNTER — Other Ambulatory Visit: Payer: Self-pay | Admitting: Medical Oncology

## 2013-01-19 ENCOUNTER — Other Ambulatory Visit: Payer: Self-pay | Admitting: Physician Assistant

## 2013-01-19 DIAGNOSIS — C9000 Multiple myeloma not having achieved remission: Secondary | ICD-10-CM

## 2013-01-20 ENCOUNTER — Other Ambulatory Visit: Payer: Self-pay | Admitting: *Deleted

## 2013-01-20 ENCOUNTER — Ambulatory Visit
Admission: RE | Admit: 2013-01-20 | Discharge: 2013-01-20 | Disposition: A | Discharge status: Home / Self Care | Payer: 59 | Source: Ambulatory Visit | Attending: Cardiothoracic Surgery | Admitting: Cardiothoracic Surgery

## 2013-01-20 ENCOUNTER — Ambulatory Visit (INDEPENDENT_AMBULATORY_CARE_PROVIDER_SITE_OTHER): Payer: Self-pay | Admitting: Cardiothoracic Surgery

## 2013-01-20 ENCOUNTER — Encounter: Payer: Self-pay | Admitting: Cardiothoracic Surgery

## 2013-01-20 ENCOUNTER — Other Ambulatory Visit: Payer: Self-pay | Admitting: Medical Oncology

## 2013-01-20 VITALS — BP 118/73 | HR 120 | Resp 20 | Ht 63.0 in | Wt 222.0 lb

## 2013-01-20 DIAGNOSIS — J869 Pyothorax without fistula: Secondary | ICD-10-CM

## 2013-01-20 DIAGNOSIS — Z09 Encounter for follow-up examination after completed treatment for conditions other than malignant neoplasm: Secondary | ICD-10-CM

## 2013-01-20 DIAGNOSIS — G629 Polyneuropathy, unspecified: Secondary | ICD-10-CM

## 2013-01-20 MED ORDER — GABAPENTIN 300 MG PO CAPS: 300.0000 mg | ORAL_CAPSULE | Freq: Three times a day (TID) | ORAL | Status: DC

## 2013-01-20 NOTE — Telephone Encounter (Signed)
Called in refill for gabapentin

## 2013-01-20 NOTE — Progress Notes (Signed)
PCP is [name], MD Referring Provider is Elfredia Nevins, MD  Chief Complaint  Patient presents with  . Routine Post Op    3 week f/u with CXR, S/P  RT VATS with drainage and decortication for empyema   52 year old male returns for followup after right thoracotomy decortication and drainage of right empyema in early February. He is chronically immunosuppressed on Cytoxan for myeloma. He finished a extended course of IV MI's with a home PICC line-Maxipime-and has been off antibiotics for 2 weeks. He recently developed nasal congestion sinus congestion and productive cough. He presents today for routine followup. His temperature today is 101.5. His chest x-ray shows no recurrent pleural effusion or empyema but does show a soft infiltrate in the right upper lung field consistent with bronchitis-pneumonia. He denies shortness of breath or chest pain   Past Surgical History  Procedure Laterality Date  . Video bronchoscopy  12/11/2012    Procedure: VIDEO BRONCHOSCOPY;  Surgeon: Kerin Perna, MD;  Location: [facility] OR;  Service: Thoracic;  Laterality: N/A;  . Video assisted thoracoscopy  12/11/2012    Procedure: VIDEO ASSISTED THORACOSCOPY;  Surgeon: Kerin Perna, MD;  Location: [facility] OR;  Service: Thoracic;  Laterality: Right;  . Decortication  12/11/2012    Procedure: DECORTICATION;  Surgeon: Kerin Perna, MD;  Location: [facility] OR;  Service: Thoracic;  Laterality: N/A;    Family History  Problem Relation Age of Onset  . Diabetic kidney disease Mother   . Hypertension Mother   . Hypertension Father   . Kidney failure Father     Social History History  Substance Use Topics  . Smoking status: Never Smoker   . Smokeless tobacco: Never Used  . Alcohol Use: No    Current Outpatient Prescriptions  Medication Sig Dispense Refill  . acetaminophen (TYLENOL) 500 MG tablet Take 1,000 mg by mouth every 6 (six) hours as needed. For pain/fever.      . benzonatate (TESSALON) 200 MG capsule Take  1 capsule (200 mg total) by mouth 3 (three) times daily as needed for cough.  30 capsule  1  . cyclophosphamide (CYTOXAN) 50 MG tablet Take 50 mg by mouth every other day.       [name] dexamethasone (DECADRON) 4 MG tablet Take 40 mg by mouth every 7 (seven) days.       [name] dextrose 5 % SOLN 50 mL with ceFEPIme 1 G SOLR 1g IV every 12 hours x 14 days  28 g  0  . gabapentin (NEURONTIN) 300 MG capsule Take 1 capsule (300 mg total) by mouth 3 (three) times daily.  90 capsule  2  . HYDROcodone-acetaminophen (NORCO) 10-325 MG per tablet Take 1-2 tablets by mouth every 6 (six) hours as needed for pain. For pain.  40 tablet  0  . irbesartan-hydrochlorothiazide (AVALIDE) 300-12.5 MG per tablet Take 1 tablet by mouth daily with breakfast.      . iron polysaccharides (NIFEREX) 150 MG capsule Take 1 capsule (150 mg total) by mouth daily.  30 capsule  1  . oxyCODONE-acetaminophen (PERCOCET/ROXICET) 5-325 MG per tablet Take 1 tablet by mouth every 6 (six) hours as needed for pain.  40 tablet  0  . pomalidomide (POMALYST) 4 MG capsule Take 1 capsule (4 mg total) by mouth daily. Take with water on days 1-21. Repeat every 28 days.  21 capsule  0  . traMADol (ULTRAM) 50 MG tablet 50 mg every 6 (six) hours as needed.       [name]  warfarin (COUMADIN) 5 MG tablet Take 5 mg by mouth daily.       No current facility-administered medications for this visit.    Allergies  Allergen Reactions  . Red Dye Anaphylaxis    Lips swollen  3-4 times their baseline size  . Other Other (See Comments)    Strawberries "anything containing red dye"    Review of Systems currently febrile and with symptoms of upper SPARC or infection   BP 118/73  Pulse 120  Resp 20  Ht 5\' 3"  (1.6 m)  Wt 222 lb (100.699 kg)  BMI 39.34 kg/m2  SpO2 97% Physical Exam  Chest incision well-healed Coarse rhonchi right lung field Heart rate increased 110 No edema  Diagnostic Tests:  chest x-ray shows soft infiltrate right upper lung zone no recurrent  pleural effusion   Impression possible early bronchopneumonia sinusitis: Start oral Augmentin 500 mg by mouth 3 times a day return with chest x-ray in 14 days.

## 2013-01-22 NOTE — Progress Notes (Signed)
[facility] called asking for an update on whether pt was currently taking pomalyst.  Informed [facility] [phone] that it is on hold for now.  SLJ

## 2013-01-25 ENCOUNTER — Telehealth: Payer: Self-pay | Admitting: Internal Medicine

## 2013-01-28 ENCOUNTER — Ambulatory Visit (HOSPITAL_BASED_OUTPATIENT_CLINIC_OR_DEPARTMENT_OTHER): Payer: 59

## 2013-01-28 ENCOUNTER — Ambulatory Visit: Payer: 59 | Admitting: Pharmacist

## 2013-01-28 ENCOUNTER — Encounter (HOSPITAL_COMMUNITY)
Admission: RE | Admit: 2013-01-28 | Discharge: 2013-01-28 | Disposition: A | Discharge status: Home / Self Care | Payer: 59 | Source: Ambulatory Visit | Attending: Internal Medicine | Admitting: Internal Medicine

## 2013-01-28 ENCOUNTER — Other Ambulatory Visit (HOSPITAL_BASED_OUTPATIENT_CLINIC_OR_DEPARTMENT_OTHER): Payer: 59 | Admitting: Lab

## 2013-01-28 ENCOUNTER — Ambulatory Visit (HOSPITAL_BASED_OUTPATIENT_CLINIC_OR_DEPARTMENT_OTHER): Payer: 59 | Admitting: Physician Assistant

## 2013-01-28 ENCOUNTER — Other Ambulatory Visit: Payer: Self-pay | Admitting: *Deleted

## 2013-01-28 VITALS — BP 121/74 | HR 123 | Temp 98.3°F | Resp 20 | Ht 63.0 in | Wt 211.6 lb

## 2013-01-28 VITALS — BP 125/76 | HR 76 | Temp 98.4°F | Resp 18

## 2013-01-28 DIAGNOSIS — D649 Anemia, unspecified: Secondary | ICD-10-CM

## 2013-01-28 DIAGNOSIS — C9000 Multiple myeloma not having achieved remission: Secondary | ICD-10-CM

## 2013-01-28 DIAGNOSIS — I776 Arteritis, unspecified: Secondary | ICD-10-CM

## 2013-01-28 DIAGNOSIS — T451X5A Adverse effect of antineoplastic and immunosuppressive drugs, initial encounter: Secondary | ICD-10-CM | POA: Insufficient documentation

## 2013-01-28 DIAGNOSIS — R209 Unspecified disturbances of skin sensation: Secondary | ICD-10-CM

## 2013-01-28 LAB — COMPREHENSIVE METABOLIC PANEL (CC13)
ALT: 9 U/L (ref 0–55)
AST: 14 U/L (ref 5–34)
CO2: 16 mEq/L — ABNORMAL LOW (ref 22–29)
Creatinine: 2.4 mg/dL — ABNORMAL HIGH (ref 0.7–1.3)
Potassium: 3.8 mEq/L (ref 3.5–5.1)
Total Bilirubin: 0.5 mg/dL (ref 0.20–1.20)
Total Protein: 13 g/dL — ABNORMAL HIGH (ref 6.4–8.3)

## 2013-01-28 LAB — PROTIME-INR

## 2013-01-28 LAB — CBC WITH DIFFERENTIAL/PLATELET
Basophils Absolute: 0 10*3/uL (ref 0.0–0.1)
EOS%: 0.9 % (ref 0.0–7.0)
HCT: 21.6 % — ABNORMAL LOW (ref 38.4–49.9)
HGB: 7.7 g/dL — ABNORMAL LOW (ref 13.0–17.1)
MCH: 31.5 pg (ref 27.2–33.4)
NEUT%: 65.4 % (ref 39.0–75.0)
Platelets: 137 10*3/uL — ABNORMAL LOW (ref 140–400)
lymph#: 1.8 10*3/uL (ref 0.9–3.3)

## 2013-01-28 MED ORDER — ACETAMINOPHEN 325 MG PO TABS
650.0000 mg | ORAL_TABLET | Freq: Once | ORAL | Status: AC
  Administered 2013-01-28: 650 mg via ORAL

## 2013-01-28 MED ORDER — DIPHENHYDRAMINE HCL 25 MG PO CAPS
25.0000 mg | ORAL_CAPSULE | Freq: Once | ORAL | Status: AC
  Administered 2013-01-28: 25 mg via ORAL

## 2013-01-28 MED ORDER — SODIUM CHLORIDE 0.9 % IV SOLN
250.0000 mL | Freq: Once | INTRAVENOUS | Status: AC
  Administered 2013-01-28: 250 mL via INTRAVENOUS

## 2013-01-28 MED ORDER — SODIUM CHLORIDE 0.9 % IJ SOLN
10.0000 mL | INTRAMUSCULAR | Status: DC | PRN
  Filled 2013-01-28: qty 10

## 2013-01-28 NOTE — Patient Instructions (Addendum)
Blood Transfusion  A blood transfusion replaces your blood or some of its parts. Blood is replaced when you have lost blood because of surgery, an accident, or for severe blood conditions like anemia. You can donate blood to be used on yourself if you have a planned surgery. If you lose blood during that surgery, your own blood can be given back to you. Any blood given to you is checked to make sure it matches your blood type. Your temperature, blood pressure, and heart rate (vital signs) will be checked often.  GET HELP RIGHT AWAY IF:   You feel sick to your stomach (nauseous) or throw up (vomit).  You have watery poop (diarrhea).  You have shortness of breath or trouble breathing.  You have blood in your pee (urine) or have dark colored pee.  You have chest pain or tightness.  Your eyes or skin turn yellow (jaundice).  You have a temperature by mouth above 102 F (38.9 C), not controlled by medicine.  You start to shake and have chills.  You develop a a red rash (hives) or feel itchy.  You develop lightheadedness or feel confused.  You develop back, joint, or muscle pain.  You do not feel hungry (lost appetite).  You feel tired, restless, or nervous.  You develop belly (abdominal) cramps. Document Released: 01/31/2009 Document Revised: 01/27/2012 Document Reviewed: 01/31/2009 [facility] Patient Information 2013 [place], [place]. [facility] Discharge Instructions for Patients Receiving Blood products Today you received the following Blood     If you develop nausea and vomiting that is not controlled by your nausea medication, call the clinic. If it is after clinic hours your family physician or the after hours number for the clinic or go to the Emergency Department.   BELOW ARE SYMPTOMS THAT SHOULD BE REPORTED IMMEDIATELY: *FEVER GREATER THAN 100.5 F *CHILLS WITH OR WITHOUT FEVER NAUSEA AND VOMITING THAT IS NOT CONTROLLED WITH YOUR NAUSEA  MEDICATION *UNUSUAL SHORTNESS OF BREATH *UNUSUAL BRUISING OR BLEEDING TENDERNESS IN MOUTH AND THROAT WITH OR WITHOUT PRESENCE OF ULCERS *URINARY PROBLEMS *BOWEL PROBLEMS UNUSUAL RASH Items with * indicate a potential emergency and should be followed up as soon as possible.  d. Feel free to call the clinic you have any questions or concerns. The clinic phone number is [phone].   I have been informed and understand all the instructions given to me. I know to contact the clinic, my physician, or go to the Emergency Department if any problems should occur. I do not have any questions at this time, but understand that I may call the clinic during office hours   should I have any questions or need assistance in obtaining follow up care.    __________________________________________  _____________  __________ Signature of Patient or Authorized Representative            Date                   Time    __________________________________________ Nurse's Signature

## 2013-01-28 NOTE — Progress Notes (Signed)
Pt has started Augmentin for sinusitis for 10 days per Dr Donata Clay which will be complete on 02/01/13.  Possible DI with Augmentin and warfarin increasing INR, but this does not seem to be occuring.  INR continues to be well below goal so will increase warfarin dose by about 20% from 5mg  daily to 7.5mg  Tues/Thus and Sat and 5mg  other days.  Will check PT/INR next week around the 20th.  Hgb = 7.7 today.  Pt has appt with Adrena following coumadin clinic appt.

## 2013-01-29 LAB — TYPE AND SCREEN
Donor AG Type: NEGATIVE
Unit division: 0

## 2013-01-31 NOTE — Progress Notes (Signed)
[facility] [phone]   Fax:([phone]  OFFICE PROGRESS NOTE  [name], MD [address] [place] [address]  Principle Diagnosis: #1 recurrent multiple myeloma IgG kappa subtype diagnosed in June of 2008 #2 history of vasculitis and thrombosis of skin lesions   Prior Therapy: #1 status post palliative radiotherapy to the left hip under the care of Dr. Roselind Messier  #2 status post 5 cycles of systemic chemotherapy with Revlimid and low dose Decadron. Last dose given June 2009 with good response.  #3 status post autologous peripheral blood stem cell transplant at [facility] on 02/02/2008.  #4 the patient had evidence for disease recurrence in December 2010.  #5 Revlimid 25 mg by mouth daily for 21 days every 4 weeks in addition to Decadron 40 mg orally on a weekly basis. The patient is status post 28 cycles, discontinued today secondary to disease progression.  #6 Systemic chemotherapy with Velcade 1.3 mg/M2 on days 1, 4, 8 and 11 in addition to Doxil 30 mg/M2 on day 4 and Decadron 40 mg by mouth on weekly basis every 3 weeks. Status post 3 cycles, last dose was given 05/04/2012 discontinued secondary to intolerance.   Current therapy:  Zometa 4 mg IV given every 3 months.  Salvage therapy treatment with the Pomalyst 4 mg by mouth daily for 21 days every 28 days as well as Cytoxan 50 mg by mouth every other day and dexamethasone 40 mg by mouth once weekly. Therapy beginning 09/25/2012, status post 2 cycles   INTERVAL HISTORY: [name] 52 y.o. male returns to the clinic today for a hospital followup visit. He is accompanied by his wife today. He had his chest tubes removed the right side by Dr. Maren Beach. He recently saw Dr. Zenaida Niece tried was placed on a course of Augmentin and has 4 days remaining. He is not eating very well and is experiencing some significant nausea with the Augmentin. He complains of his skull being  tender as well as some soreness in his left ankle with some weakness of the joint as well. Both of the patient and his wife are concerned that he is not on treatment for his multiple myeloma at this time. He is currently scheduled to see a nephrologist at the end of this month. His creatinine had risen to 3.3.  He's had no problems with bleeding or bruising. He denied having any nausea or vomiting. He denied having any significant chest pain, shortness breath, hemoptysis.  He does continue to have some numbness in his feet.   MEDICAL HISTORY: Past Medical History  Diagnosis Date  . Multiple myeloma(203.0) 09/24/2011  . Hypertension     ALLERGIES:  is allergic to red dye and other.  MEDICATIONS:  Current Outpatient Prescriptions  Medication Sig Dispense Refill  . acetaminophen (TYLENOL) 500 MG tablet Take 1,000 mg by mouth every 6 (six) hours as needed. For pain/fever.      [name] amoxicillin-clavulanate (AUGMENTIN) 500-125 MG per tablet Take 1 tablet by mouth 3 (three) times daily.      . benzonatate (TESSALON) 200 MG capsule Take 1 capsule (200 mg total) by mouth 3 (three) times daily as needed for cough.  30 capsule  1  . cyclophosphamide (CYTOXAN) 50 MG tablet Take 50 mg by mouth every other day.       [name] dexamethasone (DECADRON) 4 MG tablet Take 40 mg by mouth every 7 (seven) days.       [name]  dextrose 5 % SOLN 50 mL with ceFEPIme 1 G SOLR 1g IV every 12 hours x 14 days  28 g  0  . gabapentin (NEURONTIN) 300 MG capsule Take 1 capsule (300 mg total) by mouth 3 (three) times daily.  90 capsule  2  . HYDROcodone-acetaminophen (NORCO) 10-325 MG per tablet Take 1-2 tablets by mouth every 6 (six) hours as needed for pain. For pain.  40 tablet  0  . irbesartan-hydrochlorothiazide (AVALIDE) 300-12.5 MG per tablet Take 1 tablet by mouth daily with breakfast.      . iron polysaccharides (NIFEREX) 150 MG capsule Take 1 capsule (150 mg total) by mouth daily.  30 capsule  1  . oxyCODONE-acetaminophen  (PERCOCET/ROXICET) 5-325 MG per tablet Take 1 tablet by mouth every 6 (six) hours as needed for pain.  40 tablet  0  . pomalidomide (POMALYST) 4 MG capsule Take 1 capsule (4 mg total) by mouth daily. Take with water on days 1-21. Repeat every 28 days.  21 capsule  0  . traMADol (ULTRAM) 50 MG tablet 50 mg every 6 (six) hours as needed.       . warfarin (COUMADIN) 5 MG tablet Take 5 mg by mouth daily.       No current facility-administered medications for this visit.    REVIEW OF SYSTEMS:  Pertinent items are noted in HPI.   PHYSICAL EXAMINATION: General appearance: alert, cooperative and no distress Head: Normocephalic, without obvious abnormality, atraumatic Neck: no adenopathy Lymph nodes: Cervical, supraclavicular, and axillary nodes normal. Resp: clear to auscultation bilaterally Cardio: regular rate and rhythm, S1, S2 normal, no murmur, click, rub or gallop GI: soft, non-tender; bowel sounds normal; no masses,  no organomegaly Extremities: extremities normal, atraumatic, no cyanosis or edema Neurologic: Alert and oriented X 3, normal strength and tone. Normal symmetric reflexes. Normal coordination and gait Right lateral chest wall with 3 healed incisions from prior chest tubes, no evidence of superinfection  ECOG PERFORMANCE STATUS: 1 - Symptomatic but completely ambulatory  Blood pressure 121/74, pulse 123, temperature 98.3 F (36.8 C), temperature source Oral, resp. rate 20, height 5\' 3"  (1.6 m), weight 211 lb 9.6 oz (95.981 kg).  LABORATORY DATA: Lab Results  Component Value Date   WBC 7.2 01/28/2013   HGB 7.7* 01/28/2013   HCT 21.6* 01/28/2013   MCV 88.9 01/28/2013   PLT 137* 01/28/2013      Chemistry      Component Value Date/Time   NA 127* 01/28/2013 0857   NA 135 12/19/2012 0450   K 3.8 01/28/2013 0857   K 4.0 12/19/2012 0450   CL 101 01/28/2013 0857   CL 105 12/19/2012 0450   CO2 16* 01/28/2013 0857   CO2 24 12/19/2012 0450   BUN 23.1 01/28/2013 0857   BUN 9 12/19/2012 0450    CREATININE 2.4* 01/28/2013 0857   CREATININE 1.86* 12/19/2012 0450      Component Value Date/Time   CALCIUM 9.8 01/28/2013 0857   CALCIUM 8.5 12/19/2012 0450   ALKPHOS 32* 01/28/2013 0857   ALKPHOS 38* 12/13/2012 0401   AST 14 01/28/2013 0857   AST 19 12/13/2012 0401   ALT 9 01/28/2013 0857   ALT 12 12/13/2012 0401   BILITOT 0.50 01/28/2013 0857   BILITOT 0.4 12/13/2012 0401       RADIOGRAPHIC STUDIES: Dg Chest 2 View  09/04/2012  *RADIOLOGY REPORT*  Clinical Data: Cough.  Bacteremia.  Evaluate for pneumonia.  CHEST - 2 VIEW  Comparison: Chest x-ray 09/02/2012.  Findings: There is a new area of airspace consolidation in the right upper lobe, concerning for right upper lobe pneumonia.  Lungs otherwise appear clear.  No definite pleural effusions.  Pulmonary vasculature and the cardiomediastinal silhouette are within normal limits.  IMPRESSION: 1.  New airspace consolidation in the right upper lobe concerning for developing right upper lobe pneumonia.   Original Report Authenticated By: Florencia Reasons, M.D.    Dg Chest 2 View  09/02/2012  *RADIOLOGY REPORT*  Clinical Data: Headache, cough, fever  CHEST - 2 VIEW  Comparison: 05/29/2012  Findings: Cardiomediastinal silhouette is stable.  No acute infiltrate or pleural effusion.  No pulmonary edema.  Mild degenerative changes lower thoracic spine.  IMPRESSION: No active disease.  Mild degenerative changes lower thoracic spine.   Original Report Authenticated By: Natasha Mead, M.D.    Ct Head Wo Contrast  09/03/2012  *RADIOLOGY REPORT*  Clinical Data: Headache, altered mental status.  CT HEAD WITHOUT CONTRAST  Technique:  Contiguous axial images were obtained from the base of the skull through the vertex without contrast.  Comparison: May 08, 2007.  Findings: Diffuse lucencies are noted in the calvarium which are improved compared to the prior exam and are consistent with history of multiple myeloma.  Mild frontal atrophy is noted which is unchanged  compared to prior exam.  No mass effect or midline shift is noted.  Ventricular size is within normal limits.  There is no evidence of mass lesion, hemorrhage or acute infarction.  IMPRESSION: Lucencies noted throughout the calvarium on prior exam appear to be significantly improved consistent with the given history of multiple myeloma.  No acute intracranial abnormality is noted.   Original Report Authenticated By: Venita Sheffield., M.D.     ASSESSMENT/PLAN: This is a very pleasant 52 years old [place] American male with history of progressive multiple myeloma. He is currently being treated with salvage chemotherapy consisting of,Pomalyst 4 mg by mouth daily for 21 days every 28 days as well as Cytoxan 50 mg by mouth every other day and dexamethasone 40 mg by mouth once weekly. He is status post 2 cycles.  The patient was discussed with Dr. Arbutus Ped. His therapy with [place] and Cytoxan has been on hold due to his worsening renal insufficiency. He is scheduled to see nephrology at the end of the month. His renal function is slightly better today with a creatinine of 2.4 however his hemoglobin is low today at 7.7. We will arrange to transfuse him 2 units of packed blood cells today. His sodium is low at 127, with gradually increasing serum protein at 13.0. This as well as his worsening renal function and anemia are all indicators that his multiple myeloma is getting worse. He'll be scheduled to see Dr. Arbutus Ped in one week to discuss further treatment options. This may include resuming IV therapy in which case he will likely need a Port-A-Cath inserted do to poor peripheral IV access.   Patient voiced understanding.   [name], [name] Burley E, PA-C   All questions were answered. The patient knows to call the clinic with any problems, questions or concerns. We can certainly see the patient much sooner if necessary.

## 2013-02-01 ENCOUNTER — Telehealth: Payer: Self-pay | Admitting: Medical Oncology

## 2013-02-01 ENCOUNTER — Telehealth: Payer: Self-pay | Admitting: Internal Medicine

## 2013-02-01 DIAGNOSIS — C9000 Multiple myeloma not having achieved remission: Secondary | ICD-10-CM

## 2013-02-01 NOTE — Telephone Encounter (Signed)
Nephrologist requests pt stop avelide and that Dr Arbutus Ped order renal ultrasound proir to Dr. Marnee Guarneri appt.

## 2013-02-01 NOTE — Telephone Encounter (Signed)
Per [facility] -renal ultrasound ordered per Dr Arbutus Ped. I left message with Pam at Dr Barbaraann Rondo to call pt PCP Dr Sherwood Gambler in [place] about stopping avelide and to f/u with pt.

## 2013-02-02 ENCOUNTER — Telehealth: Payer: Self-pay | Admitting: *Deleted

## 2013-02-02 ENCOUNTER — Other Ambulatory Visit: Payer: Self-pay | Admitting: *Deleted

## 2013-02-02 DIAGNOSIS — C9000 Multiple myeloma not having achieved remission: Secondary | ICD-10-CM

## 2013-02-02 NOTE — Telephone Encounter (Signed)
sw pt appts d/t was given for 02/03/13 a lab @ 1:15 pm and a MD@ 1:45pm.

## 2013-02-03 ENCOUNTER — Other Ambulatory Visit: Payer: Self-pay | Admitting: *Deleted

## 2013-02-03 ENCOUNTER — Ambulatory Visit (HOSPITAL_COMMUNITY)
Admission: RE | Admit: 2013-02-03 | Discharge: 2013-02-03 | Disposition: A | Discharge status: Home / Self Care | Payer: 59 | Source: Ambulatory Visit | Attending: Internal Medicine | Admitting: Internal Medicine

## 2013-02-03 ENCOUNTER — Ambulatory Visit (HOSPITAL_BASED_OUTPATIENT_CLINIC_OR_DEPARTMENT_OTHER): Payer: 59 | Admitting: Internal Medicine

## 2013-02-03 ENCOUNTER — Ambulatory Visit: Payer: 59 | Admitting: Pharmacist

## 2013-02-03 ENCOUNTER — Other Ambulatory Visit (HOSPITAL_BASED_OUTPATIENT_CLINIC_OR_DEPARTMENT_OTHER): Payer: 59 | Admitting: Lab

## 2013-02-03 ENCOUNTER — Encounter: Payer: Self-pay | Admitting: Internal Medicine

## 2013-02-03 ENCOUNTER — Telehealth: Payer: Self-pay | Admitting: Internal Medicine

## 2013-02-03 VITALS — BP 122/63 | HR 104 | Temp 98.9°F | Resp 18 | Ht 63.0 in | Wt 214.6 lb

## 2013-02-03 DIAGNOSIS — J869 Pyothorax without fistula: Secondary | ICD-10-CM

## 2013-02-03 DIAGNOSIS — C9002 Multiple myeloma in relapse: Secondary | ICD-10-CM

## 2013-02-03 DIAGNOSIS — C9000 Multiple myeloma not having achieved remission: Secondary | ICD-10-CM

## 2013-02-03 DIAGNOSIS — I776 Arteritis, unspecified: Secondary | ICD-10-CM

## 2013-02-03 DIAGNOSIS — Q619 Cystic kidney disease, unspecified: Secondary | ICD-10-CM | POA: Insufficient documentation

## 2013-02-03 DIAGNOSIS — N289 Disorder of kidney and ureter, unspecified: Secondary | ICD-10-CM | POA: Insufficient documentation

## 2013-02-03 LAB — COMPREHENSIVE METABOLIC PANEL (CC13)
ALT: 9 U/L (ref 0–55)
CO2: 17 mEq/L — ABNORMAL LOW (ref 22–29)
Calcium: 9.5 mg/dL (ref 8.4–10.4)
Chloride: 106 mEq/L (ref 98–107)
Creatinine: 1.9 mg/dL — ABNORMAL HIGH (ref 0.7–1.3)
Glucose: 106 mg/dl — ABNORMAL HIGH (ref 70–99)
Total Protein: 12.4 g/dL — ABNORMAL HIGH (ref 6.4–8.3)

## 2013-02-03 LAB — CBC WITH DIFFERENTIAL/PLATELET
BASO%: 0.6 % (ref 0.0–2.0)
Basophils Absolute: 0 10*3/uL (ref 0.0–0.1)
Eosinophils Absolute: 0 10*3/uL (ref 0.0–0.5)
HGB: 8.9 g/dL — ABNORMAL LOW (ref 13.0–17.1)
LYMPH%: 29.8 % (ref 14.0–49.0)
MONO%: 14 % (ref 0.0–14.0)
NEUT#: 2.8 10*3/uL (ref 1.5–6.5)
NEUT%: 54.8 % (ref 39.0–75.0)
RDW: 17.4 % — ABNORMAL HIGH (ref 11.0–14.6)
lymph#: 1.5 10*3/uL (ref 0.9–3.3)

## 2013-02-03 LAB — POCT INR: INR: 1.5

## 2013-02-03 LAB — PROTIME-INR

## 2013-02-03 MED ORDER — DEXAMETHASONE 4 MG PO TABS: 40.0000 mg | ORAL_TABLET | ORAL | Status: DC

## 2013-02-03 NOTE — Patient Instructions (Signed)
There is evidence for disease progression on the recent bloodwork. We discussed treatment options including treatment with Kyprolis. First cycle next week. Followup visit in 2 weeks

## 2013-02-03 NOTE — Telephone Encounter (Signed)
gv and printed pt appt schedule for march...emailed MB to add tx...lvm for [name] in IR for port placement....pt aware

## 2013-02-03 NOTE — Progress Notes (Signed)
INR = 1.5 Pt has been taking Coumadin 5 mg/day; 7.5 mg Tu/Th/Sat Completed Augmentin on Monday 3/17 for Bronchitis. Has changed antihypertensives-- holding Avalide.  Now on Losartan. INR subtherapeutic.  I will put pt back on same weekly dose he was taking in Oct/Nov when he was not on steroids--> 7.5 mg/day; 5 mg Tu/Th/Sat Repeat INR next week. Ebony Hail, Pharm.D., CPP 02/03/2013@1 :57 PM

## 2013-02-03 NOTE — Progress Notes (Signed)
[facility] Telephone:([phone]) [phone]   Fax:(336) [phone]  OFFICE PROGRESS NOTE  [name], MD [address] [place] [address]  Principle Diagnosis:  #1 recurrent multiple myeloma IgG kappa subtype diagnosed in June of 2008  #2 history of vasculitis and thrombosis of skin lesions   Prior Therapy:  #1 status post palliative radiotherapy to the left hip under the care of Dr. Roselind Messier  #2 status post 5 cycles of systemic chemotherapy with Revlimid and low dose Decadron. Last dose given June 2009 with good response.  #3 status post autologous peripheral blood stem cell transplant at [facility] on 02/02/2008.  #4 the patient had evidence for disease recurrence in December 2010.  #5 Revlimid 25 mg by mouth daily for 21 days every 4 weeks in addition to Decadron 40 mg orally on a weekly basis. The patient is status post 28 cycles, discontinued today secondary to disease progression.  #6 Systemic chemotherapy with Velcade 1.3 mg/M2 on days 1, 4, 8 and 11 in addition to Doxil 30 mg/M2 on day 4 and Decadron 40 mg by mouth on weekly basis every 3 weeks. Status post 3 cycles, last dose was given 05/04/2012 discontinued secondary to intolerance.  #7 Salvage therapy treatment with the Pomalyst 4 mg by mouth daily for 21 days every 28 days as well as Cytoxan 50 mg by mouth every other day and dexamethasone 40 mg by mouth once weekly. Therapy beginning 09/25/2012, status post 2 cycles discontinued secondary to disease progression and intolerance  Current therapy:  1) Kyprolis (Carfilzomib) 20 mg/M2 on days 1, 2, 8, 9, 15 and 16 every 4 weeks. First dose expected on 02/08/2013. This would be concurrent with the dexamethasone 40 mg by mouth on a weekly basis. 2) Zometa 4 mg IV given every 3 months.   INTERVAL HISTORY: [name] 52 y.o. male returns to the clinic today for followup visit accompanied his wife. The patient is feeling a little bit  better today but still have mild fatigue and weakness. He was found recently to have progressive elevation of his serum creatinine. He was referred to nephrology for evaluation. He denied having any significant weight loss or night sweats. He denied having any chest pain, shortness breath, cough or hemoptysis. His serum total protein has also been increasing gradually. He is here today for evaluation and discussion of other treatment options.  MEDICAL HISTORY: Past Medical History  Diagnosis Date  . Multiple myeloma(203.0) 09/24/2011  . Hypertension     ALLERGIES:  is allergic to red dye and other.  MEDICATIONS:  Current Outpatient Prescriptions  Medication Sig Dispense Refill  . acetaminophen (TYLENOL) 500 MG tablet Take 1,000 mg by mouth every 6 (six) hours as needed. For pain/fever.      . benzonatate (TESSALON) 200 MG capsule Take 1 capsule (200 mg total) by mouth 3 (three) times daily as needed for cough.  30 capsule  1  . dexamethasone (DECADRON) 4 MG tablet Take 40 mg by mouth every 7 (seven) days.       [name] gabapentin (NEURONTIN) 300 MG capsule Take 1 capsule (300 mg total) by mouth 3 (three) times daily.  90 capsule  2  . HYDROcodone-acetaminophen (NORCO) 10-325 MG per tablet Take 1-2 tablets by mouth every 6 (six) hours as needed for pain. For pain.  40 tablet  0  . irbesartan-hydrochlorothiazide (AVALIDE) 300-12.5 MG per tablet Take 1 tablet by mouth daily with breakfast.      .  losartan (COZAAR) 100 MG tablet Take 100 mg by mouth daily.      . traMADol (ULTRAM) 50 MG tablet 50 mg every 6 (six) hours as needed.       . warfarin (COUMADIN) 5 MG tablet Take 5 mg by mouth daily.      . cyclophosphamide (CYTOXAN) 50 MG tablet Take 50 mg by mouth every other day.       . oxyCODONE-acetaminophen (PERCOCET/ROXICET) 5-325 MG per tablet Take 1 tablet by mouth every 6 (six) hours as needed for pain.  40 tablet  0  . pomalidomide (POMALYST) 4 MG capsule Take 1 capsule (4 mg total) by mouth  daily. Take with water on days 1-21. Repeat every 28 days.  21 capsule  0   No current facility-administered medications for this visit.    SURGICAL HISTORY:  Past Surgical History  Procedure Laterality Date  . Video bronchoscopy  12/11/2012    Procedure: VIDEO BRONCHOSCOPY;  Surgeon: Kerin Perna, MD;  Location: [facility] OR;  Service: Thoracic;  Laterality: N/A;  . Video assisted thoracoscopy  12/11/2012    Procedure: VIDEO ASSISTED THORACOSCOPY;  Surgeon: Kerin Perna, MD;  Location[facility] OR;  Service: Thoracic;  Laterality: Right;  . Decortication  12/11/2012    Procedure: DECORTICATION;  Surgeon: Kerin Perna, MD;  Location: [facility] OR;  Service: Thoracic;  Laterality: N/A;    REVIEW OF SYSTEMS:  A comprehensive review of systems was negative except for: Constitutional: positive for fatigue   PHYSICAL EXAMINATION: General appearance: alert, cooperative and no distress Head: Normocephalic, without obvious abnormality, atraumatic Neck: no adenopathy Lymph nodes: Cervical, supraclavicular, and axillary nodes normal. Resp: clear to auscultation bilaterally Cardio: regular rate and rhythm, S1, S2 normal, no murmur, click, rub or gallop GI: soft, non-tender; bowel sounds normal; no masses,  no organomegaly Extremities: extremities normal, atraumatic, no cyanosis or edema Neurologic: Alert and oriented X 3, normal [name] and tone. Normal symmetric reflexes. Normal coordination and gait  ECOG PERFORMANCE STATUS: 1 - Symptomatic but completely ambulatory  Blood pressure 122/63, pulse 104, temperature 98.9 F (37.2 C), temperature source Oral, resp. rate 18, height 5\' 3"  (1.6 m), weight 214 lb 9.6 oz (97.342 kg).  LABORATORY DATA: Lab Results  Component Value Date   WBC 5.0 02/03/2013   HGB 8.9* 02/03/2013   HCT 25.6* 02/03/2013   MCV 89.5 02/03/2013   PLT 115* 02/03/2013      Chemistry      Component Value Date/Time   NA 130* 02/03/2013 1304   NA 135 12/19/2012 0450   K 3.5 02/03/2013  1304   K 4.0 12/19/2012 0450   CL 106 02/03/2013 1304   CL 105 12/19/2012 0450   CO2 17* 02/03/2013 1304   CO2 24 12/19/2012 0450   BUN 20.0 02/03/2013 1304   BUN 9 12/19/2012 0450   CREATININE 1.9* 02/03/2013 1304   CREATININE 1.86* 12/19/2012 0450      Component Value Date/Time   CALCIUM 9.5 02/03/2013 1304   CALCIUM 8.5 12/19/2012 0450   ALKPHOS 34* 02/03/2013 1304   ALKPHOS 38* 12/13/2012 0401   AST 14 02/03/2013 1304   AST 19 12/13/2012 0401   ALT 9 02/03/2013 1304   ALT 12 12/13/2012 0401   BILITOT 0.33 02/03/2013 1304   BILITOT 0.4 12/13/2012 0401       RADIOGRAPHIC STUDIES: Dg Chest 2 View  01/20/2013  *RADIOLOGY REPORT*  Clinical Data: Empyema.  CHEST - 2 VIEW  Comparison: December 30, 2012.  Findings:  Cardiomediastinal silhouette appears normal.  Left lung is clear.  No definite pleural effusion is noted.  There is interval development of alveolar opacity involving the right upper lobe concerning for developing pneumonia.  IMPRESSION: Interval development of right upper lobe opacity concerning for pneumonia.  Follow-up radiographs are recommended to ensure resolution.   Original Report Authenticated By: Lupita Raider.,  M.D.    US Renal  02/03/2013  *RADIOLOGY REPORT*  Clinical Data: Renal insufficiency.  RENAL/URINARY TRACT ULTRASOUND COMPLETE  Comparison:  Ultrasound dated 09/21/2010  Findings:  Right Kidney:  15.6 cm in length.  Stable cyst in the lateral aspect of the right kidney.  No hydronephrosis.  No cortical thinning.  Left Kidney:  13.0 cm in length.  Normal.  No hydronephrosis or cortical thinning.  Bladder:  Normal.  IMPRESSION: Normal bilateral renal ultrasound except for a stable benign cyst in the lateral aspect of the right kidney.   Original Report Authenticated By: Francene Boyers, M.D.     ASSESSMENT: This is a very pleasant 52 years old African American male with recurrent multiple myeloma with recent progression after Pomalyst treatment and worsening renal function.  PLAN: I  have a lengthy discussion with the patient and his wife today about his condition. I recommended for the patient treatment with Kyprolis (Carfilzomib) 20 mg/m2 on days 1, 2, 8, 9, 15 and 16 every 4 weeks. He is expected to start the first cycle of this treatment next week. He would also start Decadron 40 mg by mouth on a weekly basis. I discussed with the patient adverse effect of this treatment including but not limited to cardiac dysfunction, pulmonary hypertension, tumor lysis as well as myelosuppression and hepatic toxicity. The patient would like to proceed with treatment as planned.  I would also referred him to interventional radiology for consideration of Port-A-Cath placement. He would come back for followup visit in 2 weeks for evaluation and management any adverse effect of his treatment.  All questions were answered. The patient knows to call the clinic with any problems, questions or concerns. We can certainly see the patient much sooner if necessary.  I spent 20 minutes counseling the patient face to face. The total time spent in the appointment was 30 minutes.

## 2013-02-04 ENCOUNTER — Telehealth: Payer: Self-pay | Admitting: *Deleted

## 2013-02-04 NOTE — Telephone Encounter (Signed)
Per Dr Arbutus Ped, it is okay for patient to hold coumadin x 5 days prior to port placement.  [name] in IR is aware.  SLJ

## 2013-02-05 ENCOUNTER — Ambulatory Visit
Admission: RE | Admit: 2013-02-05 | Discharge: 2013-02-05 | Disposition: A | Discharge status: Home / Self Care | Payer: 59 | Source: Ambulatory Visit | Attending: Cardiothoracic Surgery | Admitting: Cardiothoracic Surgery

## 2013-02-05 ENCOUNTER — Encounter: Payer: Self-pay | Admitting: Cardiothoracic Surgery

## 2013-02-05 ENCOUNTER — Encounter (HOSPITAL_COMMUNITY): Payer: Self-pay | Admitting: Pharmacy Technician

## 2013-02-05 ENCOUNTER — Ambulatory Visit (INDEPENDENT_AMBULATORY_CARE_PROVIDER_SITE_OTHER): Payer: Self-pay | Admitting: Cardiothoracic Surgery

## 2013-02-05 VITALS — BP 139/82 | HR 74 | Temp 97.7°F | Resp 20 | Ht 63.0 in | Wt 214.0 lb

## 2013-02-05 DIAGNOSIS — J869 Pyothorax without fistula: Secondary | ICD-10-CM

## 2013-02-05 DIAGNOSIS — G629 Polyneuropathy, unspecified: Secondary | ICD-10-CM

## 2013-02-05 DIAGNOSIS — G8918 Other acute postprocedural pain: Secondary | ICD-10-CM

## 2013-02-05 DIAGNOSIS — Z09 Encounter for follow-up examination after completed treatment for conditions other than malignant neoplasm: Secondary | ICD-10-CM

## 2013-02-05 DIAGNOSIS — G589 Mononeuropathy, unspecified: Secondary | ICD-10-CM

## 2013-02-05 MED ORDER — HYDROCODONE-ACETAMINOPHEN 10-325 MG PO TABS: 1.0000 | ORAL_TABLET | Freq: Four times a day (QID) | ORAL | Status: DC | PRN

## 2013-02-05 NOTE — Progress Notes (Signed)
PCP is [name], MD Referring Provider is Elfredia Nevins, MD  Chief Complaint  Patient presents with  . Routine Post Op    3 week f/u with CXR    HPI: Office visit after right VATS and decortication and drainage of loculated effusion.  Last visit the patient presented with fever and infiltrate right midlung treated with oral Augmentin with resolution of symptoms and resolution of infiltrate on chest x-ray.  Mild postthoracotomy pain controlled with oral narcotic Patient still has myeloma and will be treated by Dr. Arbutus Ped Patient is driving and using light weights at home  Surgical incision completely healed Chest x-ray without evidence of recurrent effusion  Past Medical History  Diagnosis Date  . Multiple myeloma(203.0) 09/24/2011  . Hypertension     Past Surgical History  Procedure Laterality Date  . Video bronchoscopy  12/11/2012    Procedure: VIDEO BRONCHOSCOPY;  Surgeon: Kerin Perna, MD;  Location: [facility] OR;  Service: Thoracic;  Laterality: N/A;  . Video assisted thoracoscopy  12/11/2012    Procedure: VIDEO ASSISTED THORACOSCOPY;  Surgeon: Kerin Perna, MD;  Location: [facility] OR;  Service: Thoracic;  Laterality: Right;  . Decortication  12/11/2012    Procedure: DECORTICATION;  Surgeon: Kerin Perna, MD;  Location: [facility] OR;  Service: Thoracic;  Laterality: N/A;    Family History  Problem Relation Age of Onset  . Diabetic kidney disease Mother   . Hypertension Mother   . Hypertension Father   . Kidney failure Father     Social History History  Substance Use Topics  . Smoking status: Never Smoker   . Smokeless tobacco: Never Used  . Alcohol Use: No    Current Outpatient Prescriptions  Medication Sig Dispense Refill  . acetaminophen (TYLENOL) 500 MG tablet Take 1,000 mg by mouth every 6 (six) hours as needed. For pain/fever.      . gabapentin (NEURONTIN) 300 MG capsule Take 1 capsule (300 mg total) by mouth 3 (three) times daily.  90 capsule  2  .  HYDROcodone-acetaminophen (NORCO) 10-325 MG per tablet Take 1-2 tablets by mouth every 6 (six) hours as needed for pain. For pain.  40 tablet  0  . irbesartan-hydrochlorothiazide (AVALIDE) 300-12.5 MG per tablet Take 1 tablet by mouth daily with breakfast.      . losartan (COZAAR) 100 MG tablet Take 100 mg by mouth every morning.       . warfarin (COUMADIN) 5 MG tablet Take 5 mg by mouth daily.       No current facility-administered medications for this visit.    Allergies  Allergen Reactions  . Red Dye Anaphylaxis    Lips swollen  3-4 times their baseline size  . Other Other (See Comments)    Strawberries "anything containing red dye"    Review of Systems no fever no productive cough Patient considering returning to work versus disability Patient states he will be needing chemotherapy under the direction of Dr. Arbutus Ped for his myeloma in that his renal function has worsened  BP 139/82  Pulse 74  Temp(Src) 97.7 F (36.5 C)  Resp 20  Ht 5\' 3"  (1.6 m)  Wt 214 lb (97.07 kg)  BMI 37.92 kg/m2  SpO2 99% Physical Exam Alert and comfortable Chest incision well-healed Breath sounds clear and equal Heart rhythm regular  Diagnostic Tests: Chest x-ray clear, pleural thickening right base unchanged  Impression: Recovered from right VATS with drainage and decortication of empyema  Plan: Return as needed

## 2013-02-08 ENCOUNTER — Other Ambulatory Visit (HOSPITAL_BASED_OUTPATIENT_CLINIC_OR_DEPARTMENT_OTHER): Payer: 59 | Admitting: Lab

## 2013-02-08 ENCOUNTER — Ambulatory Visit (HOSPITAL_BASED_OUTPATIENT_CLINIC_OR_DEPARTMENT_OTHER): Payer: 59

## 2013-02-08 ENCOUNTER — Other Ambulatory Visit: Payer: Self-pay | Admitting: Internal Medicine

## 2013-02-08 ENCOUNTER — Other Ambulatory Visit: Payer: Self-pay | Admitting: Radiology

## 2013-02-08 ENCOUNTER — Other Ambulatory Visit: Payer: Self-pay | Admitting: *Deleted

## 2013-02-08 ENCOUNTER — Encounter: Payer: Self-pay | Admitting: Pharmacist

## 2013-02-08 VITALS — BP 131/74 | HR 105 | Temp 98.2°F

## 2013-02-08 DIAGNOSIS — Z5112 Encounter for antineoplastic immunotherapy: Secondary | ICD-10-CM

## 2013-02-08 DIAGNOSIS — C9000 Multiple myeloma not having achieved remission: Secondary | ICD-10-CM

## 2013-02-08 LAB — COMPREHENSIVE METABOLIC PANEL (CC13)
ALT: 12 U/L (ref 0–55)
AST: 15 U/L (ref 5–34)
Albumin: 2.1 g/dL — ABNORMAL LOW (ref 3.5–5.0)
Alkaline Phosphatase: 31 U/L — ABNORMAL LOW (ref 40–150)
Potassium: 3.5 mEq/L (ref 3.5–5.1)
Sodium: 133 mEq/L — ABNORMAL LOW (ref 136–145)
Total Protein: 11.7 g/dL — ABNORMAL HIGH (ref 6.4–8.3)

## 2013-02-08 LAB — CBC WITH DIFFERENTIAL/PLATELET
BASO%: 0.8 % (ref 0.0–2.0)
EOS%: 1.1 % (ref 0.0–7.0)
MCH: 29.3 pg (ref 27.2–33.4)
MCHC: 33.2 g/dL (ref 32.0–36.0)
MONO#: 0.5 10*3/uL (ref 0.1–0.9)
RDW: 16.7 % — ABNORMAL HIGH (ref 11.0–14.6)
WBC: 3.8 10*3/uL — ABNORMAL LOW (ref 4.0–10.3)
lymph#: 0.9 10*3/uL (ref 0.9–3.3)
nRBC: 3 % — ABNORMAL HIGH (ref 0–0)

## 2013-02-08 MED ORDER — SODIUM CHLORIDE 0.9 % IV SOLN
Freq: Once | INTRAVENOUS | Status: AC
  Administered 2013-02-08: 09:00:00 via INTRAVENOUS

## 2013-02-08 MED ORDER — ONDANSETRON 8 MG/50ML IVPB (CHCC)
8.0000 mg | Freq: Once | INTRAVENOUS | Status: AC
  Administered 2013-02-08: 8 mg via INTRAVENOUS

## 2013-02-08 MED ORDER — DEXTROSE 5 % IV SOLN
19.0000 mg/m2 | Freq: Once | INTRAVENOUS | Status: AC
  Administered 2013-02-08: 40 mg via INTRAVENOUS
  Filled 2013-02-08: qty 20

## 2013-02-08 MED ORDER — DEXAMETHASONE SODIUM PHOSPHATE 10 MG/ML IJ SOLN
10.0000 mg | Freq: Once | INTRAMUSCULAR | Status: AC
  Administered 2013-02-08: 10 mg via INTRAVENOUS

## 2013-02-08 NOTE — Progress Notes (Signed)
Pt is getting port placed tomorrow.  He was instructed by Dr. Arbutus Ped to hold his Coumadin x 5 days before the procedure.  He started holding his doses 02/05/13. Per Dr. Arbutus Ped, ok for pt to resume his usual dose of Coumadin tomorrow evening after port has been placed.  Pt aware of plan. We will cancel his INR check planned for 3/26 & reschedule for 02/15/13.   Ebony Hail, Pharm.D., CPP 02/08/2013@11 :24 AM

## 2013-02-08 NOTE — Progress Notes (Signed)
Ok to treat per Dr. Mohamed. 

## 2013-02-08 NOTE — Patient Instructions (Addendum)
[facility] Discharge Instructions for Patients Receiving Chemotherapy  Today you received the following chemotherapy agents Kyprolis To help prevent nausea and vomiting after your treatment, we encourage you to take your nausea medication as prescribed.  If you develop nausea and vomiting that is not controlled by your nausea medication, call the clinic. If it is after clinic hours your family physician or the after hours number for the clinic or go to the Emergency Department.   BELOW ARE SYMPTOMS THAT SHOULD BE REPORTED IMMEDIATELY:  *FEVER GREATER THAN 100.5 F  *CHILLS WITH OR WITHOUT FEVER  NAUSEA AND VOMITING THAT IS NOT CONTROLLED WITH YOUR NAUSEA MEDICATION  *UNUSUAL SHORTNESS OF BREATH  *UNUSUAL BRUISING OR BLEEDING  TENDERNESS IN MOUTH AND THROAT WITH OR WITHOUT PRESENCE OF ULCERS  *URINARY PROBLEMS  *BOWEL PROBLEMS  UNUSUAL RASH Items with * indicate a potential emergency and should be followed up as soon as possible.  One of the nurses will contact you 24 hours after your treatment. Please let the nurse know about any problems that you may have experienced. Feel free to call the clinic you have any questions or concerns. The clinic phone number is [phone].   I have been informed and understand all the instructions given to me. I know to contact the clinic, my physician, or go to the Emergency Department if any problems should occur. I do not have any questions at this time, but understand that I may call the clinic during office hours   should I have any questions or need assistance in obtaining follow up care.    __________________________________________  _____________  __________ Signature of Patient or Authorized Representative            Date                   Time    __________________________________________ Nurse's Signature    

## 2013-02-09 ENCOUNTER — Ambulatory Visit (HOSPITAL_COMMUNITY)
Admission: RE | Admit: 2013-02-09 | Discharge: 2013-02-09 | Disposition: A | Discharge status: Home / Self Care | Payer: 59 | Source: Ambulatory Visit | Attending: Internal Medicine | Admitting: Internal Medicine

## 2013-02-09 ENCOUNTER — Encounter (HOSPITAL_COMMUNITY): Payer: Self-pay

## 2013-02-09 ENCOUNTER — Telehealth: Payer: Self-pay | Admitting: *Deleted

## 2013-02-09 ENCOUNTER — Encounter (HOSPITAL_COMMUNITY): Payer: Self-pay | Admitting: Pharmacy Technician

## 2013-02-09 ENCOUNTER — Other Ambulatory Visit: Payer: Self-pay | Admitting: Medical Oncology

## 2013-02-09 ENCOUNTER — Ambulatory Visit (HOSPITAL_BASED_OUTPATIENT_CLINIC_OR_DEPARTMENT_OTHER): Payer: 59

## 2013-02-09 VITALS — BP 159/79 | HR 55 | Temp 97.7°F | Resp 18

## 2013-02-09 DIAGNOSIS — I10 Essential (primary) hypertension: Secondary | ICD-10-CM | POA: Insufficient documentation

## 2013-02-09 DIAGNOSIS — C9002 Multiple myeloma in relapse: Secondary | ICD-10-CM | POA: Insufficient documentation

## 2013-02-09 DIAGNOSIS — D649 Anemia, unspecified: Secondary | ICD-10-CM

## 2013-02-09 DIAGNOSIS — C9000 Multiple myeloma not having achieved remission: Secondary | ICD-10-CM

## 2013-02-09 DIAGNOSIS — Z5112 Encounter for antineoplastic immunotherapy: Secondary | ICD-10-CM

## 2013-02-09 DIAGNOSIS — Z79899 Other long term (current) drug therapy: Secondary | ICD-10-CM | POA: Insufficient documentation

## 2013-02-09 LAB — CBC
MCH: 29.9 pg (ref 26.0–34.0)
MCHC: 34.5 g/dL (ref 30.0–36.0)
Platelets: 101 10*3/uL — ABNORMAL LOW (ref 150–400)
RDW: 16.5 % — ABNORMAL HIGH (ref 11.5–15.5)

## 2013-02-09 LAB — PROTIME-INR
INR: 1.4 (ref 0.00–1.49)
Prothrombin Time: 16.8 seconds — ABNORMAL HIGH (ref 11.6–15.2)

## 2013-02-09 LAB — APTT: aPTT: 36 seconds (ref 24–37)

## 2013-02-09 MED ORDER — DEXAMETHASONE SODIUM PHOSPHATE 10 MG/ML IJ SOLN
10.0000 mg | Freq: Once | INTRAMUSCULAR | Status: AC
  Administered 2013-02-09: 10 mg via INTRAVENOUS

## 2013-02-09 MED ORDER — SODIUM CHLORIDE 0.9 % IV SOLN
INTRAVENOUS | Status: DC
  Administered 2013-02-09: 10 mL/h via INTRAVENOUS

## 2013-02-09 MED ORDER — ONDANSETRON 8 MG/50ML IVPB (CHCC)
8.0000 mg | Freq: Once | INTRAVENOUS | Status: AC
  Administered 2013-02-09: 8 mg via INTRAVENOUS

## 2013-02-09 MED ORDER — ACETAMINOPHEN 325 MG PO TABS
650.0000 mg | ORAL_TABLET | Freq: Once | ORAL | Status: AC
  Administered 2013-02-09: 650 mg via ORAL

## 2013-02-09 MED ORDER — VANCOMYCIN HCL IN DEXTROSE 1-5 GM/200ML-% IV SOLN: 1000.0000 mg | INTRAVENOUS | Status: DC

## 2013-02-09 MED ORDER — DEXTROSE 5 % IV SOLN
20.0000 mg/m2 | Freq: Once | INTRAVENOUS | Status: AC
  Administered 2013-02-09: 40 mg via INTRAVENOUS
  Filled 2013-02-09: qty 20

## 2013-02-09 MED ORDER — DIPHENHYDRAMINE HCL 50 MG/ML IJ SOLN
25.0000 mg | Freq: Once | INTRAMUSCULAR | Status: AC
  Administered 2013-02-09: 25 mg via INTRAVENOUS

## 2013-02-09 MED ORDER — SODIUM CHLORIDE 0.9 % IV SOLN
Freq: Once | INTRAVENOUS | Status: AC
  Administered 2013-02-09: 12:00:00 via INTRAVENOUS

## 2013-02-09 NOTE — Patient Instructions (Addendum)
[facility] Discharge Instructions for Patients Receiving Chemotherapy  Today you received the following chemotherapy agents Kyprolis  To help prevent nausea and vomiting after your treatment, we encourage you to take your nausea medication as prescribed. Begin taking it as directed and take it as often as prescribed for the next 48-72 hours.   If you develop nausea and vomiting that is not controlled by your nausea medication, call the clinic. If it is after clinic hours your family physician or the after hours number for the clinic or go to the Emergency Department.   BELOW ARE SYMPTOMS THAT SHOULD BE REPORTED IMMEDIATELY:  *FEVER GREATER THAN 100.5 F  *CHILLS WITH OR WITHOUT FEVER  NAUSEA AND VOMITING THAT IS NOT CONTROLLED WITH YOUR NAUSEA MEDICATION  *UNUSUAL SHORTNESS OF BREATH  *UNUSUAL BRUISING OR BLEEDING  TENDERNESS IN MOUTH AND THROAT WITH OR WITHOUT PRESENCE OF ULCERS  *URINARY PROBLEMS  *BOWEL PROBLEMS  UNUSUAL RASH Items with * indicate a potential emergency and should be followed up as soon as possible.  One of the nurses will contact you 24 hours after your treatment. Please let the nurse know about any problems that you may have experienced. Feel free to call the clinic you have any questions or concerns. The clinic phone number is [phone].   I have been informed and understand all the instructions given to me. I know to contact the clinic, my physician, or go to the Emergency Department if any problems should occur. I do not have any questions at this time, but understand that I may call the clinic during office hours   should I have any questions or need assistance in obtaining follow up care.    __________________________________________  _____________  __________ Signature of Patient or Authorized Representative            Date                   Time    __________________________________________ Nurse's Signature   Blood  Transfusion Information WHAT IS A BLOOD TRANSFUSION? A transfusion is the replacement of blood or some of its parts. Blood is made up of multiple cells which provide different functions.  Red blood cells carry oxygen and are used for blood loss replacement.  White blood cells fight against infection.  Platelets control bleeding.  Plasma helps clot blood.  Other blood products are available for specialized needs, such as hemophilia or other clotting disorders. BEFORE THE TRANSFUSION  Who gives blood for transfusions?   You may be able to donate blood to be used at a later date on yourself (autologous donation).  Relatives can be asked to donate blood. This is generally not any safer than if you have received blood from a stranger. The same precautions are taken to ensure safety when a relative's blood is donated.  Healthy volunteers who are fully evaluated to make sure their blood is safe. This is blood bank blood. Transfusion therapy is the safest it has ever been in the practice of medicine. Before blood is taken from a donor, a complete history is taken to make sure that person has no history of diseases nor engages in risky social behavior (examples are intravenous drug use or sexual activity with multiple partners). The donor's travel history is screened to minimize risk of transmitting infections, such as malaria. The donated blood is tested for signs of infectious diseases, such as HIV and hepatitis. The blood is then tested to be sure it is  compatible with you in order to minimize the chance of a transfusion reaction. If you or a relative donates blood, this is often done in anticipation of surgery and is not appropriate for emergency situations. It takes many days to process the donated blood. RISKS AND COMPLICATIONS Although transfusion therapy is very safe and saves many lives, the main dangers of transfusion include:   Getting an infectious disease.  Developing a transfusion  reaction. This is an allergic reaction to something in the blood you were given. Every precaution is taken to prevent this. The decision to have a blood transfusion has been considered carefully by your caregiver before blood is given. Blood is not given unless the benefits outweigh the risks. AFTER THE TRANSFUSION  Right after receiving a blood transfusion, you will usually feel much better and more energetic. This is especially true if your red blood cells have gotten low (anemic). The transfusion raises the level of the red blood cells which carry oxygen, and this usually causes an energy increase.  The nurse administering the transfusion will monitor you carefully for complications. HOME CARE INSTRUCTIONS  No special instructions are needed after a transfusion. You may find your energy is better. Speak with your caregiver about any limitations on activity for underlying diseases you may have. SEEK MEDICAL CARE IF:   Your condition is not improving after your transfusion.  You develop redness or irritation at the intravenous (IV) site. SEEK IMMEDIATE MEDICAL CARE IF:  Any of the following symptoms occur over the next 12 hours:  Shaking chills.  You have a temperature by mouth above 102 F (38.9 C), not controlled by medicine.  Chest, back, or muscle pain.  People around you feel you are not acting correctly or are confused.  Shortness of breath or difficulty breathing.  Dizziness and fainting.  You get a rash or develop hives.  You have a decrease in urine output.  Your urine turns a dark color or changes to pink, red, or brown. Any of the following symptoms occur over the next 10 days:  You have a temperature by mouth above 102 F (38.9 C), not controlled by medicine.  Shortness of breath.  Weakness after normal activity.  The white part of the eye turns yellow (jaundice).  You have a decrease in the amount of urine or are urinating less often.  Your urine turns a  dark color or changes to pink, red, or brown. Document Released: 11/01/2000 Document Revised: 01/27/2012 Document Reviewed: 06/20/2008 [facility] Patient Information 2013 [place], [place].    [facility] Discharge Instructions for Patients Receiving Chemotherapy  Today you received the following chemotherapy agents: kyprolis  To help prevent nausea and vomiting after your treatment, we encourage you to take your nausea medication as prescribed.   If you develop nausea and vomiting that is not controlled by your nausea medication, call the clinic. If it is after clinic hours your family physician or the after hours number for the clinic or go to the Emergency Department.   BELOW ARE SYMPTOMS THAT SHOULD BE REPORTED IMMEDIATELY:  *FEVER GREATER THAN 100.5 F  *CHILLS WITH OR WITHOUT FEVER  NAUSEA AND VOMITING THAT IS NOT CONTROLLED WITH YOUR NAUSEA MEDICATION  *UNUSUAL SHORTNESS OF BREATH  *UNUSUAL BRUISING OR BLEEDING  TENDERNESS IN MOUTH AND THROAT WITH OR WITHOUT PRESENCE OF ULCERS  *URINARY PROBLEMS  *BOWEL PROBLEMS  UNUSUAL RASH Items with * indicate a potential emergency and should be followed up as soon as possible.  The  clinic phone number is [phone].   I have been informed and understand all the instructions given to me. I know to contact the clinic, my physician, or go to the Emergency Department if any problems should occur. I do not have any questions at this time, but understand that I may call the clinic during office hours   should I have any questions or need assistance in obtaining follow up care.    __________________________________________  _____________  __________ Signature of Patient or Authorized Representative            Date                   Time    __________________________________________ Nurse's Signature

## 2013-02-09 NOTE — Telephone Encounter (Signed)
Jeananne Rama, PA calling to triage for this patient, currently in [facility] IR for [facility] placement.  Clarification to proceed due to Hgb down to 7.0 today from 8.2 yesterday. Spoke to Dr Arbutus Ped, proceed with placement then send patient to [facility] for treatment and Transfusion. Caryn Bee will put in orders for type and crossmatch for 2units PRBCs today. Called back to [place] with update, he states that currently schedule is running behind in IR due to power failure.  Patient was going to receive first unit in Short stay recovery, but now patient should come over to [facility] as soon as stable for discharge from [facility]-Short Stay.  Spoke with Geraldine Contras and Marylu Lund, RN's in [place] stay, [phone]) they will leave peripheral IV in as well in the event we can use that line for part of patient treatment today. Dr Arbutus Ped and staff to order pre-meds and blood for transfusion today. Spoke with Carola Rhine, RN/charge in infusion with plan. We will attempt to get both units in patient today, time allowing.

## 2013-02-09 NOTE — Telephone Encounter (Signed)
Dee from Short stay calling, patient procedure for [facility] placement has been cancelled and r/s to tomorrow. Instructed Dee to go ahead and send patient to [facility] and we would try to get him started as soon as we could. Patient is on treatment schedule for 1200noon. Peripheral IV site will remain in place for our use. Informed Beth with blood bank that blood transfusion would take place here at [facility]. She will call us when ready. At this time, she is still screening, and may have to send out for second unit. We will r/s second unit transfusion to tomorrow after [facility] placement if this is the case. Beth said she would call infusion with update.

## 2013-02-09 NOTE — Progress Notes (Signed)
Procedure rescheduled due to power failure in IR, pt. To go to cancer center for blood transfusion, IV converted to saline lock and wrapped, pt. Ambulated to cancer center with wife, pt. To return in am at 9:30am.

## 2013-02-09 NOTE — H&P (Signed)
[name] is an 52 y.o. male.   Chief Complaint: "I'm getting a catheter put in" [name] with history of recurrent multiple myeloma presents today for port a cath placement for chemotherapy.  Past Medical History  Diagnosis Date  . Multiple myeloma(203.0) 09/24/2011  . Hypertension     Past Surgical History  Procedure Laterality Date  . Video bronchoscopy  12/11/2012    Procedure: VIDEO BRONCHOSCOPY;  Surgeon: Kerin Perna, MD;  Location: [facility] OR;  Service: Thoracic;  Laterality: N/A;  . Video assisted thoracoscopy  12/11/2012    Procedure: VIDEO ASSISTED THORACOSCOPY;  Surgeon: Kerin Perna, MD;  Location: [facility] OR;  Service: Thoracic;  Laterality: Right;  . Decortication  12/11/2012    Procedure: DECORTICATION;  Surgeon: Kerin Perna, MD;  Location: [facility] OR;  Service: Thoracic;  Laterality: N/A;    Family History  Problem Relation Age of Onset  . Diabetic kidney disease Mother   . Hypertension Mother   . Hypertension Father   . Kidney failure Father    Social History:  reports that he has never smoked. He has never used smokeless tobacco. He reports that he does not drink alcohol or use illicit drugs.  Allergies:  Allergies  Allergen Reactions  . Red Dye Anaphylaxis    Lips swollen  3-4 times their baseline size  . Other Other (See Comments)    Strawberries "anything containing red dye"    Current outpatient prescriptions:gabapentin (NEURONTIN) 300 MG capsule, Take 1 capsule (300 mg total) by mouth 3 (three) times daily., Disp: 90 capsule, Rfl: 2;  losartan (COZAAR) 100 MG tablet, Take 100 mg by mouth every morning. , Disp: , Rfl: ;  acetaminophen (TYLENOL) 500 MG tablet, Take 1,000 mg by mouth every 6 (six) hours as needed. For pain/fever., Disp: , Rfl:  HYDROcodone-acetaminophen (NORCO) 10-325 MG per tablet, Take 1-2 tablets by mouth every 6 (six) hours as needed for pain. For pain., Disp: 40 tablet, Rfl: 0;  warfarin (COUMADIN) 5 MG tablet, Take 5 mg by mouth  daily., Disp: , Rfl:  Current facility-administered medications:0.9 %  sodium chloride infusion, , Intravenous, Continuous, D Jeananne Rama, PA-C, Last Rate: 10 mL/hr at 02/09/13 0752, 10 mL/hr at 02/09/13 0752;  vancomycin (VANCOCIN) IVPB 1000 mg/200 mL premix, 1,000 mg, Intravenous, On Call, D Jeananne Rama, PA-C   Results for orders placed during the hospital encounter of 02/09/13 (from the past 48 hour(s))  APTT     Status: None   Collection Time    02/09/13  7:45 AM      Result Value Range   aPTT 36  24 - 37 seconds  CBC     Status: Abnormal   Collection Time    02/09/13  7:45 AM      Result Value Range   WBC 5.4  4.0 - 10.5 K/uL   RBC 2.34 (*) 4.22 - 5.81 MIL/uL   Hemoglobin 7.0 (*) 13.0 - 17.0 g/dL   HCT 47.8 (*) 29.5 - 62.1 %   MCV 86.8  78.0 - 100.0 fL   MCH 29.9  26.0 - 34.0 pg   MCHC 34.5  30.0 - 36.0 g/dL   RDW 30.8 (*) 65.7 - 84.6 %   Platelets 101 (*) 150 - 400 K/uL   Comment: SPECIMEN CHECKED FOR CLOTS     REPEATED TO VERIFY     PLATELET COUNT CONFIRMED BY SMEAR  PROTIME-INR     Status: Abnormal   Collection Time    02/09/13  7:45 AM      Result Value Range   Prothrombin Time 16.8 (*) 11.6 - 15.2 seconds   INR 1.40  0.00 - 1.49   No results found.  Review of Systems  Constitutional: Positive for malaise/fatigue. Negative for fever and chills.  Respiratory: Negative for cough and shortness of breath.   Cardiovascular:       Mild rt lat chest discomfort from recent VATS   Gastrointestinal: Negative for nausea, vomiting and abdominal pain.  Musculoskeletal: Negative for back pain.  Neurological: Negative for headaches.  Endo/Heme/Allergies: Does not bruise/bleed easily.    Blood pressure 155/76, pulse 51, temperature 97.4 F (36.3 C), temperature source Oral, resp. rate 20, SpO2 100.00%. Physical Exam  Constitutional: He is oriented to person, place, and time. He appears well-developed and well-nourished.  Cardiovascular: Regular rhythm.   bradycardic   Respiratory: Effort normal and breath sounds normal.  GI: Soft. Bowel sounds are normal. There is no tenderness.  Musculoskeletal: Normal range of motion. He exhibits no edema.  Neurological: He is alert and oriented to person, place, and time.     Assessment/Plan Pt with hx of recurrent multiple myeloma and worsening anemia. Plan is for port a cath placement today for chemotherapy and blood transfusion (to be coordinated by oncology). Details/risks of procedure d/w pt/wife with their understanding and consent.  ALLRED,D [facility] 02/09/2013, 9:51 AM  The port placement will be rescheduled because of technical problems with the interventional radiology suite.

## 2013-02-10 ENCOUNTER — Ambulatory Visit (HOSPITAL_COMMUNITY)
Admission: RE | Admit: 2013-02-10 | Discharge: 2013-02-10 | Disposition: A | Discharge status: Home / Self Care | Payer: 59 | Source: Ambulatory Visit | Attending: Internal Medicine | Admitting: Internal Medicine

## 2013-02-10 ENCOUNTER — Other Ambulatory Visit: Payer: Self-pay | Admitting: Internal Medicine

## 2013-02-10 ENCOUNTER — Other Ambulatory Visit: Payer: Self-pay | Admitting: Oncology

## 2013-02-10 ENCOUNTER — Ambulatory Visit: Payer: 59

## 2013-02-10 ENCOUNTER — Other Ambulatory Visit: Payer: Self-pay | Admitting: Medical Oncology

## 2013-02-10 ENCOUNTER — Other Ambulatory Visit: Payer: 59 | Admitting: Lab

## 2013-02-10 ENCOUNTER — Telehealth: Payer: Self-pay | Admitting: *Deleted

## 2013-02-10 ENCOUNTER — Encounter (HOSPITAL_COMMUNITY): Payer: Self-pay

## 2013-02-10 VITALS — BP 170/99 | HR 72 | Temp 97.3°F | Resp 16 | Ht 63.0 in | Wt 214.0 lb

## 2013-02-10 DIAGNOSIS — R51 Headache: Secondary | ICD-10-CM | POA: Insufficient documentation

## 2013-02-10 DIAGNOSIS — C9002 Multiple myeloma in relapse: Secondary | ICD-10-CM

## 2013-02-10 DIAGNOSIS — C9000 Multiple myeloma not having achieved remission: Secondary | ICD-10-CM

## 2013-02-10 LAB — CBC
Hemoglobin: 7.6 g/dL — ABNORMAL LOW (ref 13.0–17.0)
MCH: 30 pg (ref 26.0–34.0)
RBC: 2.53 MIL/uL — ABNORMAL LOW (ref 4.22–5.81)
WBC: 3.3 10*3/uL — ABNORMAL LOW (ref 4.0–10.5)

## 2013-02-10 MED ORDER — VANCOMYCIN HCL IN DEXTROSE 1-5 GM/200ML-% IV SOLN
INTRAVENOUS | Status: AC
  Filled 2013-02-10: qty 200

## 2013-02-10 MED ORDER — HEPARIN SOD (PORK) LOCK FLUSH 100 UNIT/ML IV SOLN
INTRAVENOUS | Status: AC | PRN
  Administered 2013-02-10: 500 [IU]

## 2013-02-10 MED ORDER — LIDOCAINE HCL 1 % IJ SOLN
INTRAMUSCULAR | Status: AC
  Filled 2013-02-10: qty 20

## 2013-02-10 MED ORDER — VANCOMYCIN HCL IN DEXTROSE 1-5 GM/200ML-% IV SOLN
1000.0000 mg | Freq: Once | INTRAVENOUS | Status: AC
  Administered 2013-02-10: 1000 mg via INTRAVENOUS

## 2013-02-10 MED ORDER — FENTANYL CITRATE 0.05 MG/ML IJ SOLN
INTRAMUSCULAR | Status: AC
  Filled 2013-02-10: qty 6

## 2013-02-10 MED ORDER — MIDAZOLAM HCL 2 MG/2ML IJ SOLN
INTRAMUSCULAR | Status: AC
  Filled 2013-02-10: qty 6

## 2013-02-10 MED ORDER — LIDOCAINE-PRILOCAINE 2.5-2.5 % EX CREA: TOPICAL_CREAM | CUTANEOUS | Status: DC | PRN

## 2013-02-10 MED ORDER — LABETALOL HCL 5 MG/ML IV SOLN: 5.0000 mg | INTRAVENOUS | Status: DC | PRN

## 2013-02-10 MED ORDER — LABETALOL HCL 5 MG/ML IV SOLN
INTRAVENOUS | Status: AC
  Filled 2013-02-10: qty 4

## 2013-02-10 MED ORDER — ACETAMINOPHEN 10 MG/ML IV SOLN
1000.0000 mg | Freq: Once | INTRAVENOUS | Status: AC
  Administered 2013-02-10: 1000 mg via INTRAVENOUS
  Filled 2013-02-10: qty 100

## 2013-02-10 MED ORDER — LORAZEPAM 2 MG/ML IJ SOLN
1.0000 mg | Freq: Once | INTRAMUSCULAR | Status: AC
  Administered 2013-02-10: 14:00:00 via INTRAVENOUS
  Filled 2013-02-10: qty 1

## 2013-02-10 MED ORDER — MIDAZOLAM HCL 2 MG/2ML IJ SOLN
INTRAMUSCULAR | Status: AC | PRN
  Administered 2013-02-10 (×3): 1 mg via INTRAVENOUS

## 2013-02-10 MED ORDER — LABETALOL HCL 5 MG/ML IV SOLN
5.0000 mg | INTRAVENOUS | Status: DC | PRN
  Administered 2013-02-10: 5 mg via INTRAVENOUS

## 2013-02-10 MED ORDER — SODIUM CHLORIDE 0.9 % IV SOLN
INTRAVENOUS | Status: DC
  Administered 2013-02-10: 11:00:00 via INTRAVENOUS

## 2013-02-10 MED ORDER — FENTANYL CITRATE 0.05 MG/ML IJ SOLN
INTRAMUSCULAR | Status: AC | PRN
  Administered 2013-02-10 (×3): 50 ug via INTRAVENOUS

## 2013-02-10 NOTE — Progress Notes (Signed)
Pt discharged home at 1800 post portacath insertion. Instructions reviewed with wife and patient about port and they were able to teach back information. They are aware that patient has an appt with the cancer center tomorrow at 3:15 for his unit of blood. No headache at discharge.  At d/c blood pressure was 170/99 which is lower than when he was admitted this am and his bp was 173/102. Patient will discuss bp with MD at his appt tomorrow. Weaned to room air post procedure and RA O2 sat 94%.  Instructed patient and wife if any significant breathing problems, another severe headache (as he had at home last night/today), or any other serious issues to call 911/come to the ER. Verbalized understanding. Escorted to car via w/c and home with wife.

## 2013-02-10 NOTE — Progress Notes (Signed)
Angelique Holm Radiology PA made aware of pts continued increased BP and increased respiratory rate. He is in there to assess patient right now.

## 2013-02-10 NOTE — Progress Notes (Signed)
Patient to IR for port placement at 1546.

## 2013-02-10 NOTE — Procedures (Signed)
R IJ Port No complication No blood loss. See complete dictation in [facility] PACS.  

## 2013-02-10 NOTE — Progress Notes (Signed)
Patient back from radiology as needs platelet transfusion prior to portacath placement. Spoke to [place] in blood bank and they are getting platelets from another hospital and will be here in 1-1.5 hours. Called Caryn Bee B. PA and informed him of above. Also informed Caryn Bee that patient's bp is still elevated at 177/105. Patient took bp med last night. Has bad headache 8/10. Order obtained for IV Tylenol. Will place order now and administer.

## 2013-02-10 NOTE — Progress Notes (Signed)
1 mg Ativan IV given per order.

## 2013-02-10 NOTE — Progress Notes (Signed)
Awaiting platelets for transfusion. Patient's respiratory rate increased to 28/minute. Sats 90-92% on RA. Head reclined up in stretcher. States head still hurts but has come down from a 8/10 to 7/10. BP now 172/111. Will call MD. Wife at bedside.

## 2013-02-10 NOTE — Progress Notes (Signed)
Dr. Deanne Coffer stated to proceed with platelet transfusion (started at 1445). He is calling Dr. Gwenyth Bouillon now. RN in room with patient closely monitoring.

## 2013-02-10 NOTE — Progress Notes (Signed)
Dr. Deanne Coffer here to see patient at 42. Explained to MD about BP.

## 2013-02-10 NOTE — Progress Notes (Signed)
Patients bp after Ativan given was 172/104 at 1415. Platelets obtained from blood bank and prior to giving them BP checked and at 1430 BP 179/115 (checked x2). Caryn Bee PA paged. Patient states head feels "a little better". RN at bedside monitoring patient and waiting to talk to PA prior to starting platelet infusion (just paged him).

## 2013-02-10 NOTE — Progress Notes (Signed)
Pts bp elevated on arrival to short stay. Rechecked and still elevated 178/105 right arm and 173/104 left arm. Angelique Holm PA made aware. Labs currently being drawn and IV started for pre-radiology procedure. Continue to monitor.

## 2013-02-10 NOTE — Progress Notes (Signed)
Patient in IR currently getting port. Spoke to Diane at [facility] and patient will go there tomorrow at 3:15 to get unit of blood. Was originally going to get this afternoon but due to having to get platelets today and port delayed til this afternoon due to platelets not able to administer today. Informed Diane RN that pts. BP has been elevated this afternoon and has continued to have bad headaches. She stated to tell patient at d/c if during the night at home he continues to feel bad or feel worse to go to the ER. Diane RN will tell Dr. Gwenyth Bouillon about BP/headache.

## 2013-02-10 NOTE — Telephone Encounter (Signed)
Pt's wife called left a msg regarding pt's BP being elevated while he was at short stay getting his port placed.  She wanted to know if it was r/t the chemo.  Per dr Donnald Garre, he does not feel that it is r/t the chemotherapy.  It could possibly be anxiety related.  Pt will be getting1 unit blood tomorrow and we will reassess BP at that time.  Left msg on her vm, also called in EMLA cream, left msg with application instructions.  SLJ

## 2013-02-11 ENCOUNTER — Other Ambulatory Visit: Payer: Self-pay | Admitting: *Deleted

## 2013-02-11 ENCOUNTER — Ambulatory Visit (HOSPITAL_BASED_OUTPATIENT_CLINIC_OR_DEPARTMENT_OTHER): Payer: 59

## 2013-02-11 VITALS — BP 133/91 | HR 110 | Temp 97.1°F | Resp 20

## 2013-02-11 DIAGNOSIS — D649 Anemia, unspecified: Secondary | ICD-10-CM

## 2013-02-11 LAB — PREPARE PLATELET PHERESIS: Unit division: 0

## 2013-02-11 MED ORDER — DIPHENHYDRAMINE HCL 25 MG PO CAPS
25.0000 mg | ORAL_CAPSULE | Freq: Once | ORAL | Status: AC
  Administered 2013-02-11: 25 mg via ORAL

## 2013-02-11 MED ORDER — SODIUM CHLORIDE 0.9 % IV SOLN
250.0000 mL | Freq: Once | INTRAVENOUS | Status: AC
  Administered 2013-02-11: 250 mL via INTRAVENOUS

## 2013-02-11 MED ORDER — SODIUM CHLORIDE 0.9 % IJ SOLN
10.0000 mL | INTRAMUSCULAR | Status: AC | PRN
  Administered 2013-02-11: 10 mL
  Filled 2013-02-11: qty 10

## 2013-02-11 MED ORDER — ACETAMINOPHEN 325 MG PO TABS
650.0000 mg | ORAL_TABLET | Freq: Once | ORAL | Status: AC
  Administered 2013-02-11: 650 mg via ORAL

## 2013-02-11 MED ORDER — HEPARIN SOD (PORK) LOCK FLUSH 100 UNIT/ML IV SOLN
500.0000 [IU] | Freq: Every day | INTRAVENOUS | Status: AC | PRN
  Administered 2013-02-11: 500 [IU]
  Filled 2013-02-11: qty 5

## 2013-02-11 NOTE — Patient Instructions (Addendum)
Blood Transfusion Information WHAT IS A BLOOD TRANSFUSION? A transfusion is the replacement of blood or some of its parts. Blood is made up of multiple cells which provide different functions.  Red blood cells carry oxygen and are used for blood loss replacement.  White blood cells fight against infection.  Platelets control bleeding.  Plasma helps clot blood.  Other blood products are available for specialized needs, such as hemophilia or other clotting disorders. BEFORE THE TRANSFUSION  Who gives blood for transfusions?   You may be able to donate blood to be used at a later date on yourself (autologous donation).  Relatives can be asked to donate blood. This is generally not any safer than if you have received blood from a stranger. The same precautions are taken to ensure safety when a relative's blood is donated.  Healthy volunteers who are fully evaluated to make sure their blood is safe. This is blood bank blood. Transfusion therapy is the safest it has ever been in the practice of medicine. Before blood is taken from a donor, a complete history is taken to make sure that person has no history of diseases nor engages in risky social behavior (examples are intravenous drug use or sexual activity with multiple partners). The donor's travel history is screened to minimize risk of transmitting infections, such as malaria. The donated blood is tested for signs of infectious diseases, such as HIV and hepatitis. The blood is then tested to be sure it is compatible with you in order to minimize the chance of a transfusion reaction. If you or a relative donates blood, this is often done in anticipation of surgery and is not appropriate for emergency situations. It takes many days to process the donated blood. RISKS AND COMPLICATIONS Although transfusion therapy is very safe and saves many lives, the main dangers of transfusion include:   Getting an infectious disease.  Developing a  transfusion reaction. This is an allergic reaction to something in the blood you were given. Every precaution is taken to prevent this. The decision to have a blood transfusion has been considered carefully by your caregiver before blood is given. Blood is not given unless the benefits outweigh the risks. AFTER THE TRANSFUSION  Right after receiving a blood transfusion, you will usually feel much better and more energetic. This is especially true if your red blood cells have gotten low (anemic). The transfusion raises the level of the red blood cells which carry oxygen, and this usually causes an energy increase.  The nurse administering the transfusion will monitor you carefully for complications. HOME CARE INSTRUCTIONS  No special instructions are needed after a transfusion. You may find your energy is better. Speak with your caregiver about any limitations on activity for underlying diseases you may have. SEEK MEDICAL CARE IF:   Your condition is not improving after your transfusion.  You develop redness or irritation at the intravenous (IV) site. SEEK IMMEDIATE MEDICAL CARE IF:  Any of the following symptoms occur over the next 12 hours:  Shaking chills.  You have a temperature by mouth above 102 F (38.9 C), not controlled by medicine.  Chest, back, or muscle pain.  People around you feel you are not acting correctly or are confused.  Shortness of breath or difficulty breathing.  Dizziness and fainting.  You get a rash or develop hives.  You have a decrease in urine output.  Your urine turns a dark color or changes to pink, red, or brown. Any of the following   symptoms occur over the next 10 days:  You have a temperature by mouth above 102 F (38.9 C), not controlled by medicine.  Shortness of breath.  Weakness after normal activity.  The white part of the eye turns yellow (jaundice).  You have a decrease in the amount of urine or are urinating less often.  Your  urine turns a dark color or changes to pink, red, or brown. Document Released: 11/01/2000 Document Revised: 01/27/2012 Document Reviewed: 06/20/2008 Exit[employer] Patient Information 2013 [employer].  

## 2013-02-12 LAB — TYPE AND SCREEN
ABO/RH(D): O POS
Donor AG Type: NEGATIVE

## 2013-02-15 ENCOUNTER — Ambulatory Visit (HOSPITAL_BASED_OUTPATIENT_CLINIC_OR_DEPARTMENT_OTHER): Payer: 59 | Admitting: Physician Assistant

## 2013-02-15 ENCOUNTER — Other Ambulatory Visit: Payer: Self-pay | Admitting: Physician Assistant

## 2013-02-15 ENCOUNTER — Ambulatory Visit (HOSPITAL_BASED_OUTPATIENT_CLINIC_OR_DEPARTMENT_OTHER): Payer: 59

## 2013-02-15 ENCOUNTER — Other Ambulatory Visit (HOSPITAL_BASED_OUTPATIENT_CLINIC_OR_DEPARTMENT_OTHER): Payer: 59 | Admitting: Lab

## 2013-02-15 ENCOUNTER — Ambulatory Visit: Payer: 59 | Admitting: Pharmacist

## 2013-02-15 ENCOUNTER — Encounter: Payer: Self-pay | Admitting: Physician Assistant

## 2013-02-15 VITALS — BP 137/69 | HR 75 | Temp 97.2°F | Resp 18 | Ht 63.0 in | Wt 221.0 lb

## 2013-02-15 DIAGNOSIS — N289 Disorder of kidney and ureter, unspecified: Secondary | ICD-10-CM

## 2013-02-15 DIAGNOSIS — R799 Abnormal finding of blood chemistry, unspecified: Secondary | ICD-10-CM

## 2013-02-15 DIAGNOSIS — Z5112 Encounter for antineoplastic immunotherapy: Secondary | ICD-10-CM

## 2013-02-15 DIAGNOSIS — C9000 Multiple myeloma not having achieved remission: Secondary | ICD-10-CM

## 2013-02-15 DIAGNOSIS — I776 Arteritis, unspecified: Secondary | ICD-10-CM

## 2013-02-15 LAB — CBC WITH DIFFERENTIAL/PLATELET
BASO%: 0.9 % (ref 0.0–2.0)
EOS%: 0.9 % (ref 0.0–7.0)
LYMPH%: 19.6 % (ref 14.0–49.0)
MCH: 29.7 pg (ref 27.2–33.4)
MCHC: 34 g/dL (ref 32.0–36.0)
MCV: 87.5 fL (ref 79.3–98.0)
MONO#: 0.5 10*3/uL (ref 0.1–0.9)
MONO%: 7.9 % (ref 0.0–14.0)
Platelets: 75 10*3/uL — ABNORMAL LOW (ref 140–400)
RBC: 3.03 10*6/uL — ABNORMAL LOW (ref 4.20–5.82)
WBC: 5.7 10*3/uL (ref 4.0–10.3)
nRBC: 2 % — ABNORMAL HIGH (ref 0–0)

## 2013-02-15 LAB — COMPREHENSIVE METABOLIC PANEL (CC13)
ALT: 44 U/L (ref 0–55)
AST: 14 U/L (ref 5–34)
Albumin: 1.8 g/dL — ABNORMAL LOW (ref 3.5–5.0)
Alkaline Phosphatase: 35 U/L — ABNORMAL LOW (ref 40–150)
BUN: 13.9 mg/dL (ref 7.0–26.0)
CO2: 17 meq/L — ABNORMAL LOW (ref 22–29)
Calcium: 8 mg/dL — ABNORMAL LOW (ref 8.4–10.4)
Chloride: 112 meq/L — ABNORMAL HIGH (ref 98–107)
Creatinine: 1.3 mg/dL (ref 0.7–1.3)
Glucose: 91 mg/dL (ref 70–99)
Potassium: 3.1 meq/L — ABNORMAL LOW (ref 3.5–5.1)
Sodium: 135 meq/L — ABNORMAL LOW (ref 136–145)
Total Bilirubin: 0.9 mg/dL (ref 0.20–1.20)
Total Protein: 10.2 g/dL — ABNORMAL HIGH (ref 6.4–8.3)

## 2013-02-15 LAB — PROTIME-INR

## 2013-02-15 LAB — LACTATE DEHYDROGENASE (CC13): LDH: 359 U/L — ABNORMAL HIGH (ref 125–245)

## 2013-02-15 LAB — TECHNOLOGIST REVIEW

## 2013-02-15 MED ORDER — SODIUM CHLORIDE 0.9 % IV SOLN
Freq: Once | INTRAVENOUS | Status: AC
  Administered 2013-02-15: 13:00:00 via INTRAVENOUS

## 2013-02-15 MED ORDER — SODIUM CHLORIDE 0.9 % IV SOLN
Freq: Once | INTRAVENOUS | Status: AC
  Administered 2013-02-15: 14:00:00 via INTRAVENOUS

## 2013-02-15 MED ORDER — DEXTROSE 5 % IV SOLN
20.0000 mg/m2 | Freq: Once | INTRAVENOUS | Status: AC
  Administered 2013-02-15: 40 mg via INTRAVENOUS
  Filled 2013-02-15: qty 20

## 2013-02-15 MED ORDER — ONDANSETRON 8 MG/50ML IVPB (CHCC)
8.0000 mg | Freq: Once | INTRAVENOUS | Status: AC
  Administered 2013-02-15: 8 mg via INTRAVENOUS

## 2013-02-15 MED ORDER — DEXAMETHASONE SODIUM PHOSPHATE 10 MG/ML IJ SOLN
10.0000 mg | Freq: Once | INTRAMUSCULAR | Status: AC
  Administered 2013-02-15: 10 mg via INTRAVENOUS

## 2013-02-15 MED ORDER — HEPARIN SOD (PORK) LOCK FLUSH 100 UNIT/ML IV SOLN
500.0000 [IU] | Freq: Once | INTRAVENOUS | Status: AC | PRN
  Administered 2013-02-15: 500 [IU]
  Filled 2013-02-15: qty 5

## 2013-02-15 MED ORDER — SODIUM CHLORIDE 0.9 % IJ SOLN
10.0000 mL | INTRAMUSCULAR | Status: DC | PRN
  Administered 2013-02-15: 10 mL
  Filled 2013-02-15: qty 10

## 2013-02-15 NOTE — Patient Instructions (Signed)
[facility] Discharge Instructions for Patients Receiving Chemotherapy  Today you received the following chemotherapy agents :  Kyprolis.  To help prevent nausea and vomiting after your treatment, we encourage you to take your nausea medication as instructed by your physician.    If you develop nausea and vomiting that is not controlled by your nausea medication, call the clinic. If it is after clinic hours your family physician or the after hours number for the clinic or go to the Emergency Department.   BELOW ARE SYMPTOMS THAT SHOULD BE REPORTED IMMEDIATELY:  *FEVER GREATER THAN 100.5 F  *CHILLS WITH OR WITHOUT FEVER  NAUSEA AND VOMITING THAT IS NOT CONTROLLED WITH YOUR NAUSEA MEDICATION  *UNUSUAL SHORTNESS OF BREATH  *UNUSUAL BRUISING OR BLEEDING  TENDERNESS IN MOUTH AND THROAT WITH OR WITHOUT PRESENCE OF ULCERS  *URINARY PROBLEMS  *BOWEL PROBLEMS  UNUSUAL RASH Items with * indicate a potential emergency and should be followed up as soon as possible.  One of the nurses will contact you 24 hours after your treatment. Please let the nurse know about any problems that you may have experienced. Feel free to call the clinic you have any questions or concerns. The clinic phone number is [phone].   I have been informed and understand all the instructions given to me. I know to contact the clinic, my physician, or go to the Emergency Department if any problems should occur. I do not have any questions at this time, but understand that I may call the clinic during office hours   should I have any questions or need assistance in obtaining follow up care.    __________________________________________  _____________  __________ Signature of Patient or Authorized Representative            Date                   Time    __________________________________________ Nurse's Signature    

## 2013-02-15 NOTE — Patient Instructions (Addendum)
Continue labs and chemotherapy as scheduled Follow-up in one week 

## 2013-02-15 NOTE — Progress Notes (Signed)
Pt saw Adrena, PA prior to chemo today.  Proceed with chemo as ordered as per PA.

## 2013-02-15 NOTE — Progress Notes (Signed)
Quick Note:  Call patient with the result and order K Dur 20 meq po qd X 7 days ______ 

## 2013-02-15 NOTE — Progress Notes (Signed)
[facility] [phone]   [phone]  OFFICE PROGRESS NOTE  [name], MD [address] [place] [address]  Principle Diagnosis:  #1 recurrent multiple myeloma IgG kappa subtype diagnosed in June of 2008  #2 history of vasculitis and thrombosis of skin lesions   Prior Therapy:  #1 status post palliative radiotherapy to the left hip under the care of Dr. Roselind Messier  #2 status post 5 cycles of systemic chemotherapy with Revlimid and low dose Decadron. Last dose given June 2009 with good response.  #3 status post autologous peripheral blood stem cell transplant at [facility] on 02/02/2008.  #4 the patient had evidence for disease recurrence in December 2010.  #5 Revlimid 25 mg by mouth daily for 21 days every 4 weeks in addition to Decadron 40 mg orally on a weekly basis. The patient is status post 28 cycles, discontinued today secondary to disease progression.  #6 Systemic chemotherapy with Velcade 1.3 mg/M2 on days 1, 4, 8 and 11 in addition to Doxil 30 mg/M2 on day 4 and Decadron 40 mg by mouth on weekly basis every 3 weeks. Status post 3 cycles, last dose was given 05/04/2012 discontinued secondary to intolerance.  #7 Salvage therapy treatment with the Pomalyst 4 mg by mouth daily for 21 days every 28 days as well as Cytoxan 50 mg by mouth every other day and dexamethasone 40 mg by mouth once weekly. Therapy beginning 09/25/2012, status post 2 cycles discontinued secondary to disease progression and intolerance  Current therapy:  1) Kyprolis (Carfilzomib) 20 mg/M2 on days 1, 2, 8, 9, 15 and 16 every 4 weeks. First dose on 02/08/2013. This would be concurrent with the dexamethasone 40 mg by mouth on a weekly basis. 2) Zometa 4 mg IV given every 3 months.   INTERVAL HISTORY: [name] 52 y.o. male returns to the clinic today for followup visit. He is status post right anterior chest Port-A-Cath placement do to  poor peripheral venous access. He is also status post packed red blood cell transfusion for her chemotherapy-induced anemia. Overall he feels generally better after the blood transfusion but continues to have some fatigue.[name] He voiced no specific complaints today. He was evaluated by [facility] and brings [place] a prescription requesting that his labs from today including a phosphorus as well as any monthly labs be faxed to them. We will comply with this request.  He denied having any significant weight loss or night sweats. He denied having any chest pain, shortness breath, cough or hemoptysis. His serum total protein has also been increasing gradually. He is here today for evaluation and discussion of other treatment options.  MEDICAL HISTORY: Past Medical History  Diagnosis Date  . Multiple myeloma(203.0) 09/24/2011  . Hypertension     ALLERGIES:  is allergic to red dye and other.  MEDICATIONS:  Current Outpatient Prescriptions  Medication Sig Dispense Refill  . acetaminophen (TYLENOL) 500 MG tablet Take 1,000 mg by mouth every 6 (six) hours as needed. For pain/fever.      . gabapentin (NEURONTIN) 300 MG capsule Take 1 capsule (300 mg total) by mouth 3 (three) times daily.  90 capsule  2  . HYDROcodone-acetaminophen (NORCO) 10-325 MG per tablet Take 1-2 tablets by mouth every 6 (six) hours as needed for pain. For pain.  40 tablet  0  . lidocaine-prilocaine (EMLA) cream Apply topically as needed. Apply to port 1 hour before infusion appt.  Cover with dressing or press-n-seal.  30 g  0  . losartan (COZAAR) 100 MG tablet Take 100 mg by mouth every morning.       . warfarin (COUMADIN) 5 MG tablet Take 5 mg by mouth every evening.        No current facility-administered medications for this visit.   Facility-Administered Medications Ordered in Other Visits  Medication Dose Route Frequency Provider Last Rate Last Dose  . sodium chloride 0.9 % injection 10 mL  10 mL Intracatheter PRN  Si Gaul, MD   10 mL at 02/15/13 1521    SURGICAL HISTORY:  Past Surgical History  Procedure Laterality Date  . Video bronchoscopy  12/11/2012    Procedure: VIDEO BRONCHOSCOPY;  Surgeon: Kerin Perna, MD;  Location: [facility] OR;  Service: Thoracic;  Laterality: N/A;  . Video assisted thoracoscopy  12/11/2012    Procedure: VIDEO ASSISTED THORACOSCOPY;  Surgeon: Kerin Perna, MD;  Location: [facility] OR;  Service: Thoracic;  Laterality: Right;  . Decortication  12/11/2012    Procedure: DECORTICATION;  Surgeon: Kerin Perna, MD;  Location: [facility] OR;  Service: Thoracic;  Laterality: N/A;    REVIEW OF SYSTEMS:  A comprehensive review of systems was negative except for: Constitutional: positive for fatigue   PHYSICAL EXAMINATION: General appearance: alert, cooperative and no distress Head: Normocephalic, without obvious abnormality, atraumatic Neck: no adenopathy Lymph nodes: Cervical, supraclavicular, and axillary nodes normal. Resp: clear to auscultation bilaterally Cardio: regular rate and rhythm, S1, S2 normal, no murmur, click, rub or gallop GI: soft, non-tender; bowel sounds normal; no masses,  no organomegaly Extremities: extremities normal, atraumatic, no cyanosis or edema Neurologic: Alert and oriented X 3, normal strength and tone. Normal symmetric reflexes. Normal coordination and gait Right anterior chest reveals recently placed port a cath, nontender no evidence of infection  ECOG PERFORMANCE STATUS: 1 - Symptomatic but completely ambulatory  Blood pressure 137/69, pulse 75, temperature 97.2 F (36.2 C), temperature source Oral, resp. rate 18, height 5\' 3"  (1.6 m), weight 221 lb (100.245 kg).  LABORATORY DATA: Lab Results  Component Value Date   WBC 5.7 02/15/2013   HGB 9.0* 02/15/2013   HCT 26.5* 02/15/2013   MCV 87.5 02/15/2013   PLT 75* 02/15/2013      Chemistry      Component Value Date/Time   NA 135* 02/15/2013 1153   NA 135 12/19/2012 0450   K 3.1* 02/15/2013 1153   K  4.0 12/19/2012 0450   CL 112* 02/15/2013 1153   CL 105 12/19/2012 0450   CO2 17* 02/15/2013 1153   CO2 24 12/19/2012 0450   BUN 13.9 02/15/2013 1153   BUN 9 12/19/2012 0450   CREATININE 1.3 02/15/2013 1153   CREATININE 1.86* 12/19/2012 0450      Component Value Date/Time   CALCIUM 8.0* 02/15/2013 1153   CALCIUM 8.5 12/19/2012 0450   ALKPHOS 35* 02/15/2013 1153   ALKPHOS 38* 12/13/2012 0401   AST 14 02/15/2013 1153   AST 19 12/13/2012 0401   ALT 44 02/15/2013 1153   ALT 12 12/13/2012 0401   BILITOT 0.90 02/15/2013 1153   BILITOT 0.4 12/13/2012 0401       RADIOGRAPHIC STUDIES: Dg Chest 2 View  01/20/2013  *RADIOLOGY REPORT*  Clinical Data: Empyema.  CHEST - 2 VIEW  Comparison: December 30, 2012.  Findings: Cardiomediastinal silhouette appears normal.  Left lung is clear.  No definite pleural effusion is noted.  There is interval development of alveolar opacity involving the right  upper lobe concerning for developing pneumonia.  IMPRESSION: Interval development of right upper lobe opacity concerning for pneumonia.  Follow-up radiographs are recommended to ensure resolution.   Original Report Authenticated By: Lupita Raider.,  M.D.    US Renal  02/03/2013  *RADIOLOGY REPORT*  Clinical Data: Renal insufficiency.  RENAL/URINARY TRACT ULTRASOUND COMPLETE  Comparison:  Ultrasound dated 09/21/2010  Findings:  Right Kidney:  15.6 cm in length.  Stable cyst in the lateral aspect of the right kidney.  No hydronephrosis.  No cortical thinning.  Left Kidney:  13.0 cm in length.  Normal.  No hydronephrosis or cortical thinning.  Bladder:  Normal.  IMPRESSION: Normal bilateral renal ultrasound except for a stable benign cyst in the lateral aspect of the right kidney.   Original Report Authenticated By: Francene Boyers, M.D.     ASSESSMENT/PLAN: This is a very pleasant 52 years old African American male with recurrent multiple myeloma with recent progression after Pomalyst treatment and worsening renal function. He is  currently being treated with Kyprolis (Carfilzomib) 20 mg/m2 on days 1, 2, 8, 9, 15 and 16 every 4 weeks.  He is also taking Decadron 40 mg by mouth on a weekly basis. He is status post day 1 and 2 of cycle 1 of Kyprolis. The patient was discussed with Dr. Arbutus Ped. His platelet count 75,000 however in given in the degree of disease progression the patient is experiencing he will proceed with day 8 and 9 of Kyprolis as scheduled. We will plan to follow the patient closely and will see him weekly with treatment. He'll followup in one week with repeat CBC differential and C. met prior to the day 15 and 16 of cycle 1.  Laural Benes, Eastin Swing E, PA-C   All questions were answered. The patient knows to call the clinic with any problems, questions or concerns. We can certainly see the patient much sooner if necessary.  I spent 20 minutes counseling the patient face to face. The total time spent in the appointment was 30 minutes.

## 2013-02-15 NOTE — Progress Notes (Signed)
INR = 1.2 Pt restarted his Coumadin last night after being off Coumadin since 02/05/13 for port-a-cath insertion (02/10/13) No complaints re: anticoag.  No bleeding. Pltc = 75; getting Kyprolis today & tomorrow. INR subtherapeutic.  Restart Coumadin at 7.5 mg/day; 5 mg Tu/Th/Sat. Recheck protime in 1 week w/ tx. Ebony Hail, Pharm.D., CPP 02/15/2013@1 :29 PM

## 2013-02-16 ENCOUNTER — Ambulatory Visit (HOSPITAL_BASED_OUTPATIENT_CLINIC_OR_DEPARTMENT_OTHER): Payer: 59

## 2013-02-16 ENCOUNTER — Telehealth: Payer: Self-pay | Admitting: Medical Oncology

## 2013-02-16 VITALS — BP 178/83 | HR 62 | Temp 97.7°F | Resp 20

## 2013-02-16 DIAGNOSIS — C9000 Multiple myeloma not having achieved remission: Secondary | ICD-10-CM

## 2013-02-16 DIAGNOSIS — C9002 Multiple myeloma in relapse: Secondary | ICD-10-CM

## 2013-02-16 DIAGNOSIS — E876 Hypokalemia: Secondary | ICD-10-CM

## 2013-02-16 DIAGNOSIS — Z5112 Encounter for antineoplastic immunotherapy: Secondary | ICD-10-CM

## 2013-02-16 MED ORDER — HEPARIN SOD (PORK) LOCK FLUSH 100 UNIT/ML IV SOLN
500.0000 [IU] | Freq: Once | INTRAVENOUS | Status: AC | PRN
  Administered 2013-02-16: 500 [IU]
  Filled 2013-02-16: qty 5

## 2013-02-16 MED ORDER — SODIUM CHLORIDE 0.9 % IV SOLN
Freq: Once | INTRAVENOUS | Status: AC
  Administered 2013-02-16: 09:00:00 via INTRAVENOUS

## 2013-02-16 MED ORDER — ONDANSETRON 8 MG/50ML IVPB (CHCC)
8.0000 mg | Freq: Once | INTRAVENOUS | Status: AC
  Administered 2013-02-16: 8 mg via INTRAVENOUS

## 2013-02-16 MED ORDER — DEXAMETHASONE SODIUM PHOSPHATE 10 MG/ML IJ SOLN
10.0000 mg | Freq: Once | INTRAMUSCULAR | Status: AC
  Administered 2013-02-16: 10 mg via INTRAVENOUS

## 2013-02-16 MED ORDER — POTASSIUM CHLORIDE CRYS ER 20 MEQ PO TBCR: 20.0000 meq | EXTENDED_RELEASE_TABLET | Freq: Every day | ORAL | Status: DC

## 2013-02-16 MED ORDER — DEXTROSE 5 % IV SOLN
20.0000 mg/m2 | Freq: Once | INTRAVENOUS | Status: AC
  Administered 2013-02-16: 40 mg via INTRAVENOUS
  Filled 2013-02-16: qty 20

## 2013-02-16 MED ORDER — SODIUM CHLORIDE 0.9 % IJ SOLN
10.0000 mL | INTRAMUSCULAR | Status: DC | PRN
  Administered 2013-02-16: 10 mL
  Filled 2013-02-16: qty 10

## 2013-02-16 NOTE — Patient Instructions (Addendum)
[facility] Discharge Instructions for Patients Receiving Chemotherapy  Today you received the following chemotherapy agents Kyprolis.  To help prevent nausea and vomiting after your treatment, we encourage you to take your nausea medication as prescribed.   If you develop nausea and vomiting that is not controlled by your nausea medication, call the clinic. If it is after clinic hours your family physician or the after hours number for the clinic or go to the Emergency Department.   BELOW ARE SYMPTOMS THAT SHOULD BE REPORTED IMMEDIATELY:  *FEVER GREATER THAN 100.5 F  *CHILLS WITH OR WITHOUT FEVER  NAUSEA AND VOMITING THAT IS NOT CONTROLLED WITH YOUR NAUSEA MEDICATION  *UNUSUAL SHORTNESS OF BREATH  *UNUSUAL BRUISING OR BLEEDING  TENDERNESS IN MOUTH AND THROAT WITH OR WITHOUT PRESENCE OF ULCERS  *URINARY PROBLEMS  *BOWEL PROBLEMS  UNUSUAL RASH Items with * indicate a potential emergency and should be followed up as soon as possible.  Please let the nurse know about any problems that you may have experienced. Feel free to call the clinic you have any questions or concerns. The clinic phone number is [phone].   I have been informed and understand all the instructions given to me. I know to contact the clinic, my physician, or go to the Emergency Department if any problems should occur. I do not have any questions at this time, but understand that I may call the clinic during office hours   should I have any questions or need assistance in obtaining follow up care.    __________________________________________  _____________  __________ Signature of Patient or Authorized Representative            Date                   Time    __________________________________________ Nurse's Signature    

## 2013-02-16 NOTE — Telephone Encounter (Signed)
Message copied by Charma Igo on Tue Feb 16, 2013 12:06 PM ------      Message from: Si Gaul      Created: Mon Feb 15, 2013  8:20 PM       Call patient with the result and order K Dur 20 meq po qd X 7 days ------

## 2013-02-16 NOTE — Telephone Encounter (Signed)
Called in kdur to pharmacy and pt notified

## 2013-02-22 ENCOUNTER — Telehealth: Payer: Self-pay | Admitting: *Deleted

## 2013-02-22 ENCOUNTER — Other Ambulatory Visit (HOSPITAL_BASED_OUTPATIENT_CLINIC_OR_DEPARTMENT_OTHER): Payer: 59

## 2013-02-22 ENCOUNTER — Encounter (HOSPITAL_COMMUNITY)
Admission: RE | Admit: 2013-02-22 | Discharge: 2013-02-22 | Disposition: A | Discharge status: Home / Self Care | Payer: 59 | Source: Ambulatory Visit | Attending: Internal Medicine | Admitting: Internal Medicine

## 2013-02-22 ENCOUNTER — Ambulatory Visit (HOSPITAL_BASED_OUTPATIENT_CLINIC_OR_DEPARTMENT_OTHER): Payer: 59 | Admitting: Physician Assistant

## 2013-02-22 ENCOUNTER — Telehealth: Payer: Self-pay | Admitting: Internal Medicine

## 2013-02-22 ENCOUNTER — Ambulatory Visit: Payer: 59

## 2013-02-22 ENCOUNTER — Ambulatory Visit (HOSPITAL_BASED_OUTPATIENT_CLINIC_OR_DEPARTMENT_OTHER): Payer: 59

## 2013-02-22 ENCOUNTER — Ambulatory Visit: Payer: 59 | Admitting: Pharmacist

## 2013-02-22 VITALS — BP 147/73 | HR 66 | Temp 98.0°F

## 2013-02-22 VITALS — BP 178/93 | HR 63 | Temp 98.3°F | Resp 20 | Ht 63.0 in | Wt 223.8 lb

## 2013-02-22 DIAGNOSIS — C9000 Multiple myeloma not having achieved remission: Secondary | ICD-10-CM | POA: Insufficient documentation

## 2013-02-22 DIAGNOSIS — I776 Arteritis, unspecified: Secondary | ICD-10-CM

## 2013-02-22 DIAGNOSIS — K219 Gastro-esophageal reflux disease without esophagitis: Secondary | ICD-10-CM

## 2013-02-22 DIAGNOSIS — R51 Headache: Secondary | ICD-10-CM

## 2013-02-22 DIAGNOSIS — I10 Essential (primary) hypertension: Secondary | ICD-10-CM

## 2013-02-22 DIAGNOSIS — Z5112 Encounter for antineoplastic immunotherapy: Secondary | ICD-10-CM

## 2013-02-22 DIAGNOSIS — T451X5A Adverse effect of antineoplastic and immunosuppressive drugs, initial encounter: Secondary | ICD-10-CM | POA: Insufficient documentation

## 2013-02-22 LAB — PROTIME-INR: INR: 2.9 (ref 2.00–3.50)

## 2013-02-22 LAB — CBC WITH DIFFERENTIAL/PLATELET
BASO%: 0.4 % (ref 0.0–2.0)
EOS%: 0.4 % (ref 0.0–7.0)
HCT: 23.4 % — ABNORMAL LOW (ref 38.4–49.9)
MCHC: 32.9 g/dL (ref 32.0–36.0)
MONO#: 0.4 10*3/uL (ref 0.1–0.9)
NEUT%: 69.8 % (ref 39.0–75.0)
RBC: 2.63 10*6/uL — ABNORMAL LOW (ref 4.20–5.82)
RDW: 17.3 % — ABNORMAL HIGH (ref 11.0–14.6)
WBC: 5.5 10*3/uL (ref 4.0–10.3)
lymph#: 1.2 10*3/uL (ref 0.9–3.3)
nRBC: 3 % — ABNORMAL HIGH (ref 0–0)

## 2013-02-22 LAB — COMPREHENSIVE METABOLIC PANEL (CC13)
ALT: 27 U/L (ref 0–55)
AST: 14 U/L (ref 5–34)
Albumin: 1.9 g/dL — ABNORMAL LOW (ref 3.5–5.0)
Alkaline Phosphatase: 36 U/L — ABNORMAL LOW (ref 40–150)
Calcium: 7.8 mg/dL — ABNORMAL LOW (ref 8.4–10.4)
Chloride: 113 mEq/L — ABNORMAL HIGH (ref 98–107)
Potassium: 3.9 mEq/L (ref 3.5–5.1)
Sodium: 135 mEq/L — ABNORMAL LOW (ref 136–145)

## 2013-02-22 MED ORDER — HEPARIN SOD (PORK) LOCK FLUSH 100 UNIT/ML IV SOLN
500.0000 [IU] | Freq: Once | INTRAVENOUS | Status: AC | PRN
  Administered 2013-02-22: 500 [IU]
  Filled 2013-02-22: qty 5

## 2013-02-22 MED ORDER — SODIUM CHLORIDE 0.9 % IJ SOLN
10.0000 mL | INTRAMUSCULAR | Status: DC | PRN
  Administered 2013-02-22: 10 mL
  Filled 2013-02-22: qty 10

## 2013-02-22 MED ORDER — DEXAMETHASONE SODIUM PHOSPHATE 10 MG/ML IJ SOLN
10.0000 mg | Freq: Once | INTRAMUSCULAR | Status: AC
  Administered 2013-02-22: 10 mg via INTRAVENOUS

## 2013-02-22 MED ORDER — SODIUM CHLORIDE 0.9 % IV SOLN
Freq: Once | INTRAVENOUS | Status: AC
  Administered 2013-02-22: 12:00:00 via INTRAVENOUS

## 2013-02-22 MED ORDER — DEXTROSE 5 % IV SOLN
20.0000 mg/m2 | Freq: Once | INTRAVENOUS | Status: AC
  Administered 2013-02-22: 40 mg via INTRAVENOUS
  Filled 2013-02-22: qty 20

## 2013-02-22 MED ORDER — ONDANSETRON 8 MG/50ML IVPB (CHCC)
8.0000 mg | Freq: Once | INTRAVENOUS | Status: AC
  Administered 2013-02-22: 8 mg via INTRAVENOUS

## 2013-02-22 MED ORDER — AMLODIPINE BESYLATE 5 MG PO TABS
5.0000 mg | ORAL_TABLET | Freq: Once | ORAL | Status: AC
  Administered 2013-02-22: 5 mg via ORAL
  Filled 2013-02-22: qty 1

## 2013-02-22 NOTE — Telephone Encounter (Signed)
Gave pt appt for lab and MD, chemo for APril 2014

## 2013-02-22 NOTE — Progress Notes (Signed)
Pt therapeutic today at 2.9. Continue same dose. 7.5mg  daily with 5mg  on Tue, Thur and Sat. We will see in infusion on 03/08/13

## 2013-02-22 NOTE — Patient Instructions (Addendum)
continue Coumadin to 7.5mg  daily except 5 mg Tues/Thurs/Sat    Will check PT/INR on 03/08/13 at 9:30 am for Coumadin clinic.  A pharmacist can see you in the infusion room.

## 2013-02-22 NOTE — Patient Instructions (Addendum)
[facility] Discharge Instructions for Patients Receiving Chemotherapy  Today you received the following chemotherapy agents Kyprolis.  To help prevent nausea and vomiting after your treatment, we encourage you to take your nausea medication as directed.   If you develop nausea and vomiting that is not controlled by your nausea medication, call the clinic. If it is after clinic hours your family physician or the after hours number for the clinic or go to the Emergency Department.   BELOW ARE SYMPTOMS THAT SHOULD BE REPORTED IMMEDIATELY:  *FEVER GREATER THAN 100.5 F  *CHILLS WITH OR WITHOUT FEVER  NAUSEA AND VOMITING THAT IS NOT CONTROLLED WITH YOUR NAUSEA MEDICATION  *UNUSUAL SHORTNESS OF BREATH  *UNUSUAL BRUISING OR BLEEDING  TENDERNESS IN MOUTH AND THROAT WITH OR WITHOUT PRESENCE OF ULCERS  *URINARY PROBLEMS  *BOWEL PROBLEMS  UNUSUAL RASH Items with * indicate a potential emergency and should be followed up as soon as possible.  Feel free to call the clinic you have any questions or concerns. The clinic phone number is [phone].   

## 2013-02-22 NOTE — Telephone Encounter (Signed)
Per staff phone call and POF I have schedueld appts.  [name]  

## 2013-02-22 NOTE — Patient Instructions (Addendum)
Keep appointment for blood transfusion if not completed with your chemotherapy Follow up in 1 week for a symptom management visit Contact your primary care physician regarding your blood pressure medication

## 2013-02-23 ENCOUNTER — Ambulatory Visit (HOSPITAL_BASED_OUTPATIENT_CLINIC_OR_DEPARTMENT_OTHER): Payer: 59

## 2013-02-23 ENCOUNTER — Other Ambulatory Visit: Payer: Self-pay | Admitting: *Deleted

## 2013-02-23 ENCOUNTER — Ambulatory Visit (HOSPITAL_COMMUNITY)
Admission: RE | Admit: 2013-02-23 | Discharge: 2013-02-23 | Disposition: A | Discharge status: Home / Self Care | Payer: 59 | Source: Ambulatory Visit | Attending: Physician Assistant | Admitting: Physician Assistant

## 2013-02-23 ENCOUNTER — Ambulatory Visit: Payer: 59

## 2013-02-23 VITALS — BP 171/84 | HR 60 | Temp 97.2°F | Resp 20

## 2013-02-23 DIAGNOSIS — C9000 Multiple myeloma not having achieved remission: Secondary | ICD-10-CM | POA: Insufficient documentation

## 2013-02-23 DIAGNOSIS — C9002 Multiple myeloma in relapse: Secondary | ICD-10-CM

## 2013-02-23 DIAGNOSIS — R51 Headache: Secondary | ICD-10-CM

## 2013-02-23 DIAGNOSIS — Z5112 Encounter for antineoplastic immunotherapy: Secondary | ICD-10-CM

## 2013-02-23 DIAGNOSIS — D649 Anemia, unspecified: Secondary | ICD-10-CM

## 2013-02-23 MED ORDER — AMLODIPINE BESYLATE 5 MG PO TABS: 5.0000 mg | ORAL_TABLET | Freq: Every day | ORAL | Status: DC

## 2013-02-23 MED ORDER — DEXAMETHASONE SODIUM PHOSPHATE 10 MG/ML IJ SOLN
10.0000 mg | Freq: Once | INTRAMUSCULAR | Status: AC
  Administered 2013-02-23: 10 mg via INTRAVENOUS

## 2013-02-23 MED ORDER — OMEPRAZOLE 40 MG PO CPDR: 40.0000 mg | DELAYED_RELEASE_CAPSULE | Freq: Every day | ORAL | Status: DC

## 2013-02-23 MED ORDER — CLONIDINE HCL 0.1 MG PO TABS: 0.1000 mg | ORAL_TABLET | Freq: Once | ORAL | Status: DC

## 2013-02-23 MED ORDER — AMLODIPINE BESYLATE 5 MG PO TABS
5.0000 mg | ORAL_TABLET | Freq: Every day | ORAL | Status: DC
  Administered 2013-02-23: 5 mg via ORAL
  Filled 2013-02-23: qty 1

## 2013-02-23 MED ORDER — ONDANSETRON 8 MG/50ML IVPB (CHCC)
8.0000 mg | Freq: Once | INTRAVENOUS | Status: AC
  Administered 2013-02-23: 8 mg via INTRAVENOUS

## 2013-02-23 MED ORDER — DIPHENHYDRAMINE HCL 25 MG PO CAPS
25.0000 mg | ORAL_CAPSULE | Freq: Once | ORAL | Status: DC
  Administered 2013-02-23: 25 mg via ORAL

## 2013-02-23 MED ORDER — SODIUM CHLORIDE 0.9 % IJ SOLN
10.0000 mL | INTRAMUSCULAR | Status: DC | PRN
  Administered 2013-02-23: 10 mL
  Filled 2013-02-23: qty 10

## 2013-02-23 MED ORDER — HEPARIN SOD (PORK) LOCK FLUSH 100 UNIT/ML IV SOLN
500.0000 [IU] | Freq: Once | INTRAVENOUS | Status: AC | PRN
  Administered 2013-02-23: 500 [IU]
  Filled 2013-02-23: qty 5

## 2013-02-23 MED ORDER — SODIUM CHLORIDE 0.9 % IV SOLN
Freq: Once | INTRAVENOUS | Status: AC
  Administered 2013-02-23: 13:00:00 via INTRAVENOUS

## 2013-02-23 MED ORDER — SODIUM CHLORIDE 0.9 % IV SOLN: Freq: Once | INTRAVENOUS | Status: DC

## 2013-02-23 MED ORDER — ACETAMINOPHEN 325 MG PO TABS
650.0000 mg | ORAL_TABLET | Freq: Once | ORAL | Status: DC
  Administered 2013-02-23: 650 mg via ORAL

## 2013-02-23 MED ORDER — DEXTROSE 5 % IV SOLN
20.0000 mg/m2 | Freq: Once | INTRAVENOUS | Status: AC
  Administered 2013-02-23: 40 mg via INTRAVENOUS
  Filled 2013-02-23: qty 20

## 2013-02-23 MED ORDER — SODIUM CHLORIDE 0.9 % IV SOLN
250.0000 mL | Freq: Once | INTRAVENOUS | Status: DC
Start: 2013-02-23 — End: 2013-02-23

## 2013-02-23 NOTE — Patient Instructions (Addendum)
[facility] Discharge Instructions for Patients Receiving Chemotherapy  Today you received the following chemotherapy agents Kyprolis.  To help prevent nausea and vomiting after your treatment, we encourage you to take your nausea medication as prescribed.   If you develop nausea and vomiting that is not controlled by your nausea medication, call the clinic. If it is after clinic hours your family physician or the after hours number for the clinic or go to the Emergency Department.   BELOW ARE SYMPTOMS THAT SHOULD BE REPORTED IMMEDIATELY:  *FEVER GREATER THAN 100.5 F  *CHILLS WITH OR WITHOUT FEVER  NAUSEA AND VOMITING THAT IS NOT CONTROLLED WITH YOUR NAUSEA MEDICATION  *UNUSUAL SHORTNESS OF BREATH  *UNUSUAL BRUISING OR BLEEDING  TENDERNESS IN MOUTH AND THROAT WITH OR WITHOUT PRESENCE OF ULCERS  *URINARY PROBLEMS  *BOWEL PROBLEMS  UNUSUAL RASH Items with * indicate a potential emergency and should be followed up as soon as possible.  Feel free to call the clinic you have any questions or concerns. The clinic phone number is [phone].   I have been informed and understand all the instructions given to me. I know to contact the clinic, my physician, or go to the Emergency Department if any problems should occur. I do not have any questions at this time, but understand that I may call the clinic during office hours   should I have any questions or need assistance in obtaining follow up care.    __________________________________________  _____________  __________ Signature of Patient or Authorized Representative            Date                   Time    __________________________________________ Nurse's Signature   Blood Transfusion Information WHAT IS A BLOOD TRANSFUSION? A transfusion is the replacement of blood or some of its parts. Blood is made up of multiple cells which provide different functions.  Red blood cells carry oxygen and are used for  blood loss replacement.  White blood cells fight against infection.  Platelets control bleeding.  Plasma helps clot blood.  Other blood products are available for specialized needs, such as hemophilia or other clotting disorders. BEFORE THE TRANSFUSION  Who gives blood for transfusions?   You may be able to donate blood to be used at a later date on yourself (autologous donation).  Relatives can be asked to donate blood. This is generally not any safer than if you have received blood from a stranger. The same precautions are taken to ensure safety when a relative's blood is donated.  Healthy volunteers who are fully evaluated to make sure their blood is safe. This is blood bank blood. Transfusion therapy is the safest it has ever been in the practice of medicine. Before blood is taken from a donor, a complete history is taken to make sure that person has no history of diseases nor engages in risky social behavior (examples are intravenous drug use or sexual activity with multiple partners). The donor's travel history is screened to minimize risk of transmitting infections, such as malaria. The donated blood is tested for signs of infectious diseases, such as HIV and hepatitis. The blood is then tested to be sure it is compatible with you in order to minimize the chance of a transfusion reaction. If you or a relative donates blood, this is often done in anticipation of surgery and is not appropriate for emergency situations. It takes many days to process the  donated blood. RISKS AND COMPLICATIONS Although transfusion therapy is very safe and saves many lives, the main dangers of transfusion include:   Getting an infectious disease.  Developing a transfusion reaction. This is an allergic reaction to something in the blood you were given. Every precaution is taken to prevent this. The decision to have a blood transfusion has been considered carefully by your caregiver before blood is given.  Blood is not given unless the benefits outweigh the risks. AFTER THE TRANSFUSION  Right after receiving a blood transfusion, you will usually feel much better and more energetic. This is especially true if your red blood cells have gotten low (anemic). The transfusion raises the level of the red blood cells which carry oxygen, and this usually causes an energy increase.  The nurse administering the transfusion will monitor you carefully for complications. HOME CARE INSTRUCTIONS  No special instructions are needed after a transfusion. You may find your energy is better. Speak with your caregiver about any limitations on activity for underlying diseases you may have. SEEK MEDICAL CARE IF:   Your condition is not improving after your transfusion.  You develop redness or irritation at the intravenous (IV) site. SEEK IMMEDIATE MEDICAL CARE IF:  Any of the following symptoms occur over the next 12 hours:  Shaking chills.  You have a temperature by mouth above 102 F (38.9 C), not controlled by medicine.  Chest, back, or muscle pain.  People around you feel you are not acting correctly or are confused.  Shortness of breath or difficulty breathing.  Dizziness and fainting.  You get a rash or develop hives.  You have a decrease in urine output.  Your urine turns a dark color or changes to pink, red, or brown. Any of the following symptoms occur over the next 10 days:  You have a temperature by mouth above 102 F (38.9 C), not controlled by medicine.  Shortness of breath.  Weakness after normal activity.  The white part of the eye turns yellow (jaundice).  You have a decrease in the amount of urine or are urinating less often.  Your urine turns a dark color or changes to pink, red, or brown. Document Released: 11/01/2000 Document Revised: 01/27/2012 Document Reviewed: 06/20/2008 [facility] Patient Information 2013 [place], [place].

## 2013-02-24 ENCOUNTER — Other Ambulatory Visit: Payer: Self-pay | Admitting: Certified Registered Nurse Anesthetist

## 2013-02-24 LAB — TYPE AND SCREEN
Donor AG Type: NEGATIVE
Donor AG Type: NEGATIVE
Unit division: 0

## 2013-02-27 NOTE — Progress Notes (Signed)
[facility] [phone]) [phone]   Fax:([phone]) [phone]  OFFICE PROGRESS NOTE  [name], MD [address] [place] [address]  Principle Diagnosis:  #1 recurrent multiple myeloma IgG kappa subtype diagnosed in June of 2008  #2 history of vasculitis and thrombosis of skin lesions   Prior Therapy:  #1 status post palliative radiotherapy to the left hip under the care of Dr. Roselind Messier  #2 status post 5 cycles of systemic chemotherapy with Revlimid and low dose Decadron. Last dose given June 2009 with good response.  #3 status post autologous peripheral blood stem cell transplant at [facility] on 02/02/2008.  #4 the patient had evidence for disease recurrence in December 2010.  #5 Revlimid 25 mg by mouth daily for 21 days every 4 weeks in addition to Decadron 40 mg orally on a weekly basis. The patient is status post 28 cycles, discontinued today secondary to disease progression.  #6 Systemic chemotherapy with Velcade 1.3 mg/M2 on days 1, 4, 8 and 11 in addition to Doxil 30 mg/M2 on day 4 and Decadron 40 mg by mouth on weekly basis every 3 weeks. Status post 3 cycles, last dose was given 05/04/2012 discontinued secondary to intolerance.  #7 Salvage therapy treatment with the Pomalyst 4 mg by mouth daily for 21 days every 28 days as well as Cytoxan 50 mg by mouth every other day and dexamethasone 40 mg by mouth once weekly. Therapy beginning 09/25/2012, status post 2 cycles discontinued secondary to disease progression and intolerance  Current therapy:  1) Kyprolis (Carfilzomib) 20 mg/M2 on days 1, 2, 8, 9, 15 and 16 every 4 weeks. First dose on 02/08/2013. This would be concurrent with the dexamethasone 40 mg by mouth on a weekly basis. 2) Zometa 4 mg IV given every 3 months.   INTERVAL HISTORY: [name] 52 y.o. male returns to the clinic today for followup visit. He complains of fatigue. He expresses concern that his current  treatment is not working. His blood pressure has been elevated and plans to get in touch with his primary care provider regarding this. He requests a refill for his omeprazole.  He voiced no other specific complaints today.  He denied having any significant weight loss or night sweats. He denied having any chest pain, shortness breath, cough or hemoptysis.   MEDICAL HISTORY: Past Medical History  Diagnosis Date  . Multiple myeloma(203.0) 09/24/2011  . Hypertension     ALLERGIES:  is allergic to red dye and other.  MEDICATIONS:  Current Outpatient Prescriptions  Medication Sig Dispense Refill  . acetaminophen (TYLENOL) 500 MG tablet Take 1,000 mg by mouth every 6 (six) hours as needed. For pain/fever.      [name] amLODipine (NORVASC) 5 MG tablet Take 1 tablet (5 mg total) by mouth daily.  30 tablet  1  . gabapentin (NEURONTIN) 300 MG capsule Take 1 capsule (300 mg total) by mouth 3 (three) times daily.  90 capsule  2  . HYDROcodone-acetaminophen (NORCO) 10-325 MG per tablet Take 1-2 tablets by mouth every 6 (six) hours as needed for pain. For pain.  40 tablet  0  . lidocaine-prilocaine (EMLA) cream Apply topically as needed. Apply to port 1 hour before infusion appt.  Cover with dressing or press-n-seal.  30 g  0  . losartan (COZAAR) 100 MG tablet Take 100 mg by mouth every morning.       [name] omeprazole (PRILOSEC) 40 MG capsule Take 1  capsule (40 mg total) by mouth daily.  30 capsule  3  . potassium chloride SA (K-DUR,KLOR-CON) 20 MEQ tablet Take 1 tablet (20 mEq total) by mouth daily.  7 tablet  0  . warfarin (COUMADIN) 5 MG tablet Take 5 mg by mouth every evening.        No current facility-administered medications for this visit.    SURGICAL HISTORY:  Past Surgical History  Procedure Laterality Date  . Video bronchoscopy  12/11/2012    Procedure: VIDEO BRONCHOSCOPY;  Surgeon: Kerin Perna, MD;  Location: [facility] OR;  Service: Thoracic;  Laterality: N/A;  . Video assisted thoracoscopy   12/11/2012    Procedure: VIDEO ASSISTED THORACOSCOPY;  Surgeon: Kerin Perna, MD;  Location: [facility] OR;  Service: Thoracic;  Laterality: Right;  . Decortication  12/11/2012    Procedure: DECORTICATION;  Surgeon: Kerin Perna, MD;  Location: [facility] OR;  Service: Thoracic;  Laterality: N/A;    REVIEW OF SYSTEMS:  A comprehensive review of systems was negative except for: Constitutional: positive for fatigue   PHYSICAL EXAMINATION: General appearance: alert, cooperative and no distress Head: Normocephalic, without obvious abnormality, atraumatic Neck: no adenopathy Lymph nodes: Cervical, supraclavicular, and axillary nodes normal. Resp: clear to auscultation bilaterally Cardio: regular rate and rhythm, S1, S2 normal, no murmur, click, rub or gallop GI: soft, non-tender; bowel sounds normal; no masses,  no organomegaly Extremities: extremities normal, atraumatic, no cyanosis or edema Neurologic: Alert and oriented X 3, normal strength and tone. Normal symmetric reflexes. Normal coordination and gait Right anterior chest reveals recently placed port a cath, nontender no evidence of infection  ECOG PERFORMANCE STATUS: 1 - Symptomatic but completely ambulatory  Blood pressure 178/93, pulse 63, temperature 98.3 F (36.8 C), temperature source Oral, resp. rate 20, height 5\' 3"  (1.6 m), weight 223 lb 12.8 oz (101.515 kg).  LABORATORY DATA: Lab Results  Component Value Date   WBC 5.5 02/22/2013   HGB 7.7* 02/22/2013   HCT 23.4* 02/22/2013   MCV 89.0 02/22/2013   PLT 78* 02/22/2013      Chemistry      Component Value Date/Time   NA 135* 02/22/2013 0948   NA 135 12/19/2012 0450   K 3.9 02/22/2013 0948   K 4.0 12/19/2012 0450   CL 113* 02/22/2013 0948   CL 105 12/19/2012 0450   CO2 16* 02/22/2013 0948   CO2 24 12/19/2012 0450   BUN 13.8 02/22/2013 0948   BUN 9 12/19/2012 0450   CREATININE 1.0 02/22/2013 0948   CREATININE 1.86* 12/19/2012 0450      Component Value Date/Time   CALCIUM 7.8* 02/22/2013 0948   CALCIUM 8.5  12/19/2012 0450   ALKPHOS 36* 02/22/2013 0948   ALKPHOS 38* 12/13/2012 0401   AST 14 02/22/2013 0948   AST 19 12/13/2012 0401   ALT 27 02/22/2013 0948   ALT 12 12/13/2012 0401   BILITOT 0.41 02/22/2013 0948   BILITOT 0.4 12/13/2012 0401       RADIOGRAPHIC STUDIES: Dg Chest 2 View  01/20/2013  *RADIOLOGY REPORT*  Clinical Data: Empyema.  CHEST - 2 VIEW  Comparison: December 30, 2012.  Findings: Cardiomediastinal silhouette appears normal.  Left lung is clear.  No definite pleural effusion is noted.  There is interval development of alveolar opacity involving the right upper lobe concerning for developing pneumonia.  IMPRESSION: Interval development of right upper lobe opacity concerning for pneumonia.  Follow-up radiographs are recommended to ensure resolution.   Original Report Authenticated By: Roque Lias  Montez Hageman.,  M.D.    US Renal  02/03/2013  *RADIOLOGY REPORT*  Clinical Data: Renal insufficiency.  RENAL/URINARY TRACT ULTRASOUND COMPLETE  Comparison:  Ultrasound dated 09/21/2010  Findings:  Right Kidney:  15.6 cm in length.  Stable cyst in the lateral aspect of the right kidney.  No hydronephrosis.  No cortical thinning.  Left Kidney:  13.0 cm in length.  Normal.  No hydronephrosis or cortical thinning.  Bladder:  Normal.  IMPRESSION: Normal bilateral renal ultrasound except for a stable benign cyst in the lateral aspect of the right kidney.   Original Report Authenticated By: Francene Boyers, M.D.     ASSESSMENT/PLAN: This is a very pleasant 52 years old African American male with recurrent multiple myeloma with recent progression after Pomalyst treatment and worsening renal function. He is currently being treated with Kyprolis (Carfilzomib) 20 mg/m2 on days 1, 2, 8, 9, 15 and 16 every 4 weeks.  He is also taking Decadron 40 mg by mouth on a weekly basis. He is status post day 1,2,8 and 9 of cycle 1 of Kyprolis. The patient was discussed with Dr. Arbutus Ped. His platelet count 78,000 however given the degree of  disease progression the patient is experiencing he will proceed with day 15 and 16 of Kyprolis as scheduled. His hemoglobin of 7.7 gm/dl will be addressed with a transfusion of 2 units of PRBCs. To address his hypertension we will add Norvasc 5 mg by mouth daily until he is re-evaluated by either his primary care physician or his nephrologist regarding his hypertension.We will plan to follow the patient closely and will see him weekly with treatment. He'll followup in one week with repeat CBC differential and C. Met.   Laural Benes, Hawke Villalpando E, PA-C   All questions were answered. The patient knows to call the clinic with any problems, questions or concerns. We can certainly see the patient much sooner if necessary.  I spent 20 minutes counseling the patient face to face. The total time spent in the appointment was 30 minutes.

## 2013-03-01 ENCOUNTER — Other Ambulatory Visit: Payer: Self-pay | Admitting: Medical Oncology

## 2013-03-01 ENCOUNTER — Telehealth: Payer: Self-pay | Admitting: Medical Oncology

## 2013-03-01 ENCOUNTER — Ambulatory Visit: Payer: 59 | Admitting: Physician Assistant

## 2013-03-01 ENCOUNTER — Other Ambulatory Visit: Payer: 59 | Admitting: Lab

## 2013-03-01 NOTE — Telephone Encounter (Signed)
Transportation issues -cannot come in today . He will call when he has transportation.

## 2013-03-04 ENCOUNTER — Other Ambulatory Visit: Payer: Self-pay | Admitting: *Deleted

## 2013-03-04 ENCOUNTER — Telehealth: Payer: Self-pay | Admitting: *Deleted

## 2013-03-04 DIAGNOSIS — C9000 Multiple myeloma not having achieved remission: Secondary | ICD-10-CM

## 2013-03-04 NOTE — Telephone Encounter (Signed)
Per desk RN I have rescheduled appts.  [name]

## 2013-03-08 ENCOUNTER — Ambulatory Visit: Payer: 59

## 2013-03-08 ENCOUNTER — Ambulatory Visit: Payer: 59 | Admitting: Pharmacist

## 2013-03-08 ENCOUNTER — Other Ambulatory Visit (HOSPITAL_BASED_OUTPATIENT_CLINIC_OR_DEPARTMENT_OTHER): Payer: 59 | Admitting: Lab

## 2013-03-08 ENCOUNTER — Encounter: Payer: Self-pay | Admitting: Internal Medicine

## 2013-03-08 ENCOUNTER — Ambulatory Visit (HOSPITAL_BASED_OUTPATIENT_CLINIC_OR_DEPARTMENT_OTHER): Payer: 59

## 2013-03-08 ENCOUNTER — Telehealth: Payer: Self-pay | Admitting: Internal Medicine

## 2013-03-08 ENCOUNTER — Ambulatory Visit (HOSPITAL_BASED_OUTPATIENT_CLINIC_OR_DEPARTMENT_OTHER): Payer: 59 | Admitting: Physician Assistant

## 2013-03-08 ENCOUNTER — Encounter: Payer: Self-pay | Admitting: Lab

## 2013-03-08 ENCOUNTER — Telehealth: Payer: Self-pay | Admitting: *Deleted

## 2013-03-08 VITALS — BP 111/63 | HR 100 | Temp 97.3°F | Resp 20 | Ht 63.0 in | Wt 212.7 lb

## 2013-03-08 DIAGNOSIS — C9000 Multiple myeloma not having achieved remission: Secondary | ICD-10-CM

## 2013-03-08 DIAGNOSIS — C9002 Multiple myeloma in relapse: Secondary | ICD-10-CM

## 2013-03-08 DIAGNOSIS — I10 Essential (primary) hypertension: Secondary | ICD-10-CM

## 2013-03-08 DIAGNOSIS — I776 Arteritis, unspecified: Secondary | ICD-10-CM

## 2013-03-08 DIAGNOSIS — R5383 Other fatigue: Secondary | ICD-10-CM

## 2013-03-08 DIAGNOSIS — Z5112 Encounter for antineoplastic immunotherapy: Secondary | ICD-10-CM

## 2013-03-08 LAB — POCT INR: INR: 1.6

## 2013-03-08 LAB — CBC WITH DIFFERENTIAL/PLATELET
Basophils Absolute: 0 10*3/uL (ref 0.0–0.1)
EOS%: 0.4 % (ref 0.0–7.0)
Eosinophils Absolute: 0 10*3/uL (ref 0.0–0.5)
HGB: 9.1 g/dL — ABNORMAL LOW (ref 13.0–17.1)
MONO#: 0.4 10*3/uL (ref 0.1–0.9)
NEUT#: 2.7 10*3/uL (ref 1.5–6.5)
RDW: 17 % — ABNORMAL HIGH (ref 11.0–14.6)
WBC: 4.5 10*3/uL (ref 4.0–10.3)
lymph#: 1.4 10*3/uL (ref 0.9–3.3)

## 2013-03-08 LAB — PROTIME-INR: INR: 1.6 — ABNORMAL LOW (ref 2.00–3.50)

## 2013-03-08 LAB — COMPREHENSIVE METABOLIC PANEL (CC13)
AST: 26 U/L (ref 5–34)
Alkaline Phosphatase: 33 U/L — ABNORMAL LOW (ref 40–150)
BUN: 43 mg/dL — ABNORMAL HIGH (ref 7.0–26.0)
Creatinine: 2.4 mg/dL — ABNORMAL HIGH (ref 0.7–1.3)

## 2013-03-08 MED ORDER — SODIUM CHLORIDE 0.9 % IV SOLN
Freq: Once | INTRAVENOUS | Status: AC
  Administered 2013-03-08: 13:00:00 via INTRAVENOUS

## 2013-03-08 MED ORDER — HEPARIN SOD (PORK) LOCK FLUSH 100 UNIT/ML IV SOLN
500.0000 [IU] | Freq: Once | INTRAVENOUS | Status: AC | PRN
  Administered 2013-03-08: 500 [IU]
  Filled 2013-03-08: qty 5

## 2013-03-08 MED ORDER — ZOLEDRONIC ACID 4 MG/100ML IV SOLN
4.0000 mg | Freq: Once | INTRAVENOUS | Status: AC
  Administered 2013-03-08: 4 mg via INTRAVENOUS
  Filled 2013-03-08: qty 100

## 2013-03-08 MED ORDER — DEXAMETHASONE SODIUM PHOSPHATE 10 MG/ML IJ SOLN
10.0000 mg | Freq: Once | INTRAMUSCULAR | Status: AC
  Administered 2013-03-08: 10 mg via INTRAVENOUS

## 2013-03-08 MED ORDER — DEXTROSE 5 % IV SOLN
27.0000 mg/m2 | Freq: Once | INTRAVENOUS | Status: AC
  Administered 2013-03-08: 54 mg via INTRAVENOUS
  Filled 2013-03-08: qty 27

## 2013-03-08 MED ORDER — ONDANSETRON 8 MG/50ML IVPB (CHCC)
8.0000 mg | Freq: Once | INTRAVENOUS | Status: AC
  Administered 2013-03-08: 8 mg via INTRAVENOUS

## 2013-03-08 MED ORDER — SODIUM CHLORIDE 0.9 % IJ SOLN
10.0000 mL | INTRAMUSCULAR | Status: DC | PRN
  Administered 2013-03-08: 10 mL
  Filled 2013-03-08: qty 10

## 2013-03-08 NOTE — Patient Instructions (Addendum)
[facility] Discharge Instructions for Patients Receiving Chemotherapy  Today you received the following chemotherapy agents kyprolis, zometa  To help prevent nausea and vomiting after your treatment, we encourage you to take your nausea medication as directed by MD Begin taking it at 7 pm and take it as often as prescribed if needed.   If you develop nausea and vomiting that is not controlled by your nausea medication, call the clinic. If it is after clinic hours your family physician or the after hours number for the clinic or go to the Emergency Department.   BELOW ARE SYMPTOMS THAT SHOULD BE REPORTED IMMEDIATELY:  *FEVER GREATER THAN 100.5 F  *CHILLS WITH OR WITHOUT FEVER  NAUSEA AND VOMITING THAT IS NOT CONTROLLED WITH YOUR NAUSEA MEDICATION  *UNUSUAL SHORTNESS OF BREATH  *UNUSUAL BRUISING OR BLEEDING  TENDERNESS IN MOUTH AND THROAT WITH OR WITHOUT PRESENCE OF ULCERS  *URINARY PROBLEMS  *BOWEL PROBLEMS  UNUSUAL RASH Items with * indicate a potential emergency and should be followed up as soon as possible.  Feel free to call the clinic you have any questions or concerns. The clinic phone number is [phone].   I have been informed and understand all the instructions given to me. I know to contact the clinic, my physician, or go to the Emergency Department if any problems should occur. I do not have any questions at this time, but understand that I may call the clinic during office hours   should I have any questions or need assistance in obtaining follow up care.    __________________________________________  _____________  __________ Signature of Patient or Authorized Representative            Date                   Time    __________________________________________ Nurse's Signature

## 2013-03-08 NOTE — Telephone Encounter (Signed)
Per staff phone call and POF I have schedueld appts.  [name]  

## 2013-03-08 NOTE — Progress Notes (Signed)
INR below goal today. This is due to pt missing 2 doses of his Coumadin in the last week. He fell asleep before taking his medication. His blood pressure medications are being adjusted by his kidney physician, Dr. Reynolds Bowl. He is scheduled to pick up 2 new blood pressure medications in the next few days. I have asked the pt to bring the medications with him at next visit so we can update his profile. Pt restarted Lisinopril 20mg /12.5mg  BID (he thinks this is the name of the medication) on his own to help with the headaches he was having last week due to his BP being very elevated.  This medication was stopped previously by nephrology because they felt it was worsening his kidney function. Headaches are relieved by Zestoretic (Lisinopril 20mg /HCTZ 12.5mg  BID) and his BP is controlled today (111/63). He has stopped the Losartan and the Norvasc (pt felt they weren't working). He has not started the Clonidine. Continue Coumadin 7.5mg  daily except 5 mg Tues/Thurs/Sat.    Reheck PT/INR on 03/15/13; lab at 11:15am, [facility] apt at 11:45am, treatment at 12:45pm and coumadin clinic at 1:15pm.   A pharmacist will see you in the infusion room.

## 2013-03-08 NOTE — Telephone Encounter (Signed)
gv and printed appt schedule and avs for pt....[name] add tx....pt aware

## 2013-03-08 NOTE — Progress Notes (Signed)
I gave the patient an EPP. He is not working just wife. He wants help in getting a wheel chair if at all possible. He will start getting social security next month.

## 2013-03-08 NOTE — Progress Notes (Signed)
OK to treat with platelet of 80 per [name] PA/ Abran Richard RN/ Garald Braver RN. OK to resume Zometa per Dr Arbutus Ped

## 2013-03-08 NOTE — Patient Instructions (Addendum)
Continue with Kyprolis as scheduled Followup with your nephrologist regarding your blood pressure medications Followup in one week for a symptom management visit

## 2013-03-08 NOTE — Patient Instructions (Addendum)
Continue Coumadin 7.5mg  daily except 5 mg Tues/Thurs/Sat.    Reheck PT/INR on 03/15/13; lab at 11:15am, [facility] at 11:45am, treatment at 12:45pm and coumadin clinic at 1:15pm.   A pharmacist will see you in the infusion room. Please bring any blood pressure medications with you to your next coumadin clinic visit so the pharmacist can review and update your medication list.

## 2013-03-09 ENCOUNTER — Ambulatory Visit (HOSPITAL_BASED_OUTPATIENT_CLINIC_OR_DEPARTMENT_OTHER): Payer: 59

## 2013-03-09 VITALS — BP 147/86 | HR 64 | Temp 97.3°F | Resp 18

## 2013-03-09 DIAGNOSIS — C9000 Multiple myeloma not having achieved remission: Secondary | ICD-10-CM

## 2013-03-09 DIAGNOSIS — Z5112 Encounter for antineoplastic immunotherapy: Secondary | ICD-10-CM

## 2013-03-09 MED ORDER — ONDANSETRON 8 MG/50ML IVPB (CHCC)
8.0000 mg | Freq: Once | INTRAVENOUS | Status: AC
  Administered 2013-03-09: 8 mg via INTRAVENOUS

## 2013-03-09 MED ORDER — CARFILZOMIB CHEMO INJECTION 60 MG
27.0000 mg/m2 | Freq: Once | INTRAVENOUS | Status: AC
  Administered 2013-03-09: 54 mg via INTRAVENOUS
  Filled 2013-03-09: qty 27

## 2013-03-09 MED ORDER — DEXAMETHASONE SODIUM PHOSPHATE 10 MG/ML IJ SOLN
10.0000 mg | Freq: Once | INTRAMUSCULAR | Status: AC
  Administered 2013-03-09: 10 mg via INTRAVENOUS

## 2013-03-09 MED ORDER — SODIUM CHLORIDE 0.9 % IV SOLN
Freq: Once | INTRAVENOUS | Status: AC
  Administered 2013-03-09: 10:00:00 via INTRAVENOUS

## 2013-03-09 MED ORDER — SODIUM CHLORIDE 0.9 % IJ SOLN
10.0000 mL | INTRAMUSCULAR | Status: DC | PRN
  Administered 2013-03-09: 10 mL
  Filled 2013-03-09: qty 10

## 2013-03-09 MED ORDER — HEPARIN SOD (PORK) LOCK FLUSH 100 UNIT/ML IV SOLN
500.0000 [IU] | Freq: Once | INTRAVENOUS | Status: AC | PRN
  Administered 2013-03-09: 500 [IU]
  Filled 2013-03-09: qty 5

## 2013-03-09 NOTE — Progress Notes (Signed)
Labs reviewed by MD. OK to treat today.

## 2013-03-09 NOTE — Patient Instructions (Signed)
[facility] Discharge Instructions for Patients Receiving Chemotherapy  Today you received the following chemotherapy agents Kyprolis.  To help prevent nausea and vomiting after your treatment, we encourage you to take your nausea medication as prescribed.   If you develop nausea and vomiting that is not controlled by your nausea medication, call the clinic. If it is after clinic hours your family physician or the after hours number for the clinic or go to the Emergency Department.   BELOW ARE SYMPTOMS THAT SHOULD BE REPORTED IMMEDIATELY:  *FEVER GREATER THAN 100.5 F  *CHILLS WITH OR WITHOUT FEVER  NAUSEA AND VOMITING THAT IS NOT CONTROLLED WITH YOUR NAUSEA MEDICATION  *UNUSUAL SHORTNESS OF BREATH  *UNUSUAL BRUISING OR BLEEDING  TENDERNESS IN MOUTH AND THROAT WITH OR WITHOUT PRESENCE OF ULCERS  *URINARY PROBLEMS  *BOWEL PROBLEMS  UNUSUAL RASH Items with * indicate a potential emergency and should be followed up as soon as possible.  Feel free to call the clinic you have any questions or concerns. The clinic phone number is [phone].   I have been informed and understand all the instructions given to me. I know to contact the clinic, my physician, or go to the Emergency Department if any problems should occur. I do not have any questions at this time, but understand that I may call the clinic during office hours   should I have any questions or need assistance in obtaining follow up care.    __________________________________________  _____________  __________ Signature of Patient or Authorized Representative            Date                   Time    __________________________________________ Nurse's Signature    

## 2013-03-10 NOTE — Progress Notes (Signed)
[name] Telephone:([phone]) [phone]   Fax:(336) [phone]  OFFICE PROGRESS NOTE  [name] Smiles., MD [address] [place] [address]  Principle Diagnosis:  #1 recurrent multiple myeloma IgG kappa subtype diagnosed in June of 2008  #2 history of vasculitis and thrombosis of skin lesions   Prior Therapy:  #1 status post palliative radiotherapy to the left hip under the care of Dr. Roselind Messier  #2 status post 5 cycles of systemic chemotherapy with Revlimid and low dose Decadron. Last dose given June 2009 with good response.  #3 status post autologous peripheral blood stem cell transplant at [facility] on 02/02/2008.  #4 the patient had evidence for disease recurrence in December 2010.  #5 Revlimid 25 mg by mouth daily for 21 days every 4 weeks in addition to Decadron 40 mg orally on a weekly basis. The patient is status post 28 cycles, discontinued today secondary to disease progression.  #6 Systemic chemotherapy with Velcade 1.3 mg/M2 on days 1, 4, 8 and 11 in addition to Doxil 30 mg/M2 on day 4 and Decadron 40 mg by mouth on weekly basis every 3 weeks. Status post 3 cycles, last dose was given 05/04/2012 discontinued secondary to intolerance.  #7 Salvage therapy treatment with the Pomalyst 4 mg by mouth daily for 21 days every 28 days as well as Cytoxan 50 mg by mouth every other day and dexamethasone 40 mg by mouth once weekly. Therapy beginning 09/25/2012, status post 2 cycles discontinued secondary to disease progression and intolerance  Current therapy:  1) Kyprolis (Carfilzomib) 20 mg/M2 on days 1, 2, 8, 9, 15 and 16 every 4 weeks. First dose on 02/08/2013. This would be concurrent with the dexamethasone 40 mg by mouth on a weekly basis. Status post 1 cycle 2) Zometa 4 mg IV given every 3 months.   INTERVAL HISTORY: [name] 52 y.o. male returns to the clinic today for followup visit to proceed with cycle #2 of his Kyprolis. He  states that his nephrologist and started him on 2 medications for his blood pressure however only one of the medications was sent to his pharmacy of record. The medications were to be clonidine 0.1 mg at bedtime and losartan 100 mg by mouth daily. Only the clonidine was called in to his pharmacy. Patient was very frustrated and his blood pressure was remaining in the 190s over 90s with his home readings and so he took it upon himself to restart his Avalide. He complains of fatigue.He voiced no other specific complaints today.  He denied having any significant weight loss or night sweats. He denied having any chest pain, shortness breath, cough or hemoptysis.   MEDICAL HISTORY: Past Medical History  Diagnosis Date  . Multiple myeloma(203.0) 09/24/2011  . Hypertension     ALLERGIES:  is allergic to red dye and other.  MEDICATIONS:  Current Outpatient Prescriptions  Medication Sig Dispense Refill  . irbesartan-hydrochlorothiazide (AVALIDE) 150-12.5 MG per tablet Take 1 tablet by mouth daily.      [name] lidocaine-prilocaine (EMLA) cream Apply topically as needed. Apply to port 1 hour before infusion appt.  Cover with dressing or press-n-seal.  30 g  0  . acetaminophen (TYLENOL) 500 MG tablet Take 1,000 mg by mouth every 6 (six) hours as needed. For pain/fever.      [name] amLODipine (NORVASC) 5 MG tablet Take 1 tablet (5 mg total) by mouth daily.  30 tablet  1  . cloNIDine (CATAPRES)  0.1 MG tablet       . gabapentin (NEURONTIN) 300 MG capsule Take 1 capsule (300 mg total) by mouth 3 (three) times daily.  90 capsule  2  . HYDROcodone-acetaminophen (NORCO) 10-325 MG per tablet Take 1-2 tablets by mouth every 6 (six) hours as needed for pain. For pain.  40 tablet  0  . losartan (COZAAR) 100 MG tablet Take 100 mg by mouth every morning.       [name] omeprazole (PRILOSEC) 40 MG capsule Take 1 capsule (40 mg total) by mouth daily.  30 capsule  3  . potassium chloride SA (K-DUR,KLOR-CON) 20 MEQ tablet Take 1 tablet (20  mEq total) by mouth daily.  7 tablet  0  . warfarin (COUMADIN) 5 MG tablet Take by mouth as directed. Take 7.5mg  daily except 5mg  on TuThuSa       No current facility-administered medications for this visit.    SURGICAL HISTORY:  Past Surgical History  Procedure Laterality Date  . Video bronchoscopy  12/11/2012    Procedure: VIDEO BRONCHOSCOPY;  Surgeon: Kerin Perna, MD;  [name];  Service: Thoracic;  Laterality: N/A;  . Video assisted thoracoscopy  12/11/2012    Procedure: VIDEO ASSISTED THORACOSCOPY;  Surgeon: Kerin Perna, MD;  Location: [facility] OR;  Service: Thoracic;  Laterality: Right;  . Decortication  12/11/2012    Procedure: DECORTICATION;  Surgeon: Kerin Perna, MD;  Location: [facility] OR;  Service: Thoracic;  Laterality: N/A;    REVIEW OF SYSTEMS:  A comprehensive review of systems was negative except for: Constitutional: positive for fatigue   PHYSICAL EXAMINATION: General appearance: alert, cooperative and no distress Head: Normocephalic, without obvious abnormality, atraumatic Neck: no adenopathy Lymph nodes: Cervical, supraclavicular, and axillary nodes normal. Resp: clear to auscultation bilaterally Cardio: regular rate and rhythm, S1, S2 normal, no murmur, click, rub or gallop GI: soft, non-tender; bowel sounds normal; no masses,  no organomegaly Extremities: extremities normal, atraumatic, no cyanosis or edema Neurologic: Alert and oriented X 3, normal strength and tone. Normal symmetric reflexes. Normal coordination and gait Right anterior chest reveals recently placed port a cath, incisions well-healed,nontender no evidence of infection  ECOG PERFORMANCE STATUS: 1 - Symptomatic but completely ambulatory  Blood pressure 111/63, pulse 100, temperature 97.3 F (36.3 C), temperature source Oral, resp. rate 20, height 5\' 3"  (1.6 m), weight 212 lb 11.2 oz (96.48 kg), SpO2 99.00%.  LABORATORY DATA: Lab Results  Component Value Date   WBC 4.5 03/08/2013   HGB  9.1* 03/08/2013   HCT 27.1* 03/08/2013   MCV 86.9 03/08/2013   PLT 80* 03/08/2013      Chemistry      Component Value Date/Time   NA 129* 03/08/2013 1157   NA 135 12/19/2012 0450   K 3.9 03/08/2013 1157   K 4.0 12/19/2012 0450   CL 103 03/08/2013 1157   CL 105 12/19/2012 0450   CO2 17* 03/08/2013 1157   CO2 24 12/19/2012 0450   BUN 43.0* 03/08/2013 1157   BUN 9 12/19/2012 0450   CREATININE 2.4* 03/08/2013 1157   CREATININE 1.86* 12/19/2012 0450      Component Value Date/Time   CALCIUM 10.0 03/08/2013 1157   CALCIUM 8.5 12/19/2012 0450   ALKPHOS 33* 03/08/2013 1157   ALKPHOS 38* 12/13/2012 0401   AST 26 03/08/2013 1157   AST 19 12/13/2012 0401   ALT 15 03/08/2013 1157   ALT 12 12/13/2012 0401   BILITOT 0.71 03/08/2013 1157   BILITOT 0.4 12/13/2012  0401       RADIOGRAPHIC STUDIES: Dg Chest 2 View  01/20/2013  *RADIOLOGY REPORT*  Clinical Data: Empyema.  CHEST - 2 VIEW  Comparison: December 30, 2012.  Findings: Cardiomediastinal silhouette appears normal.  Left lung is clear.  No definite pleural effusion is noted.  There is interval development of alveolar opacity involving the right upper lobe concerning for developing pneumonia.  IMPRESSION: Interval development of right upper lobe opacity concerning for pneumonia.  Follow-up radiographs are recommended to ensure resolution.   Original Report Authenticated By: Lupita Raider.,  M.D.    US Renal  02/03/2013  *RADIOLOGY REPORT*  Clinical Data: Renal insufficiency.  RENAL/URINARY TRACT ULTRASOUND COMPLETE  Comparison:  Ultrasound dated 09/21/2010  Findings:  Right Kidney:  15.6 cm in length.  Stable cyst in the lateral aspect of the right kidney.  No hydronephrosis.  No cortical thinning.  Left Kidney:  13.0 cm in length.  Normal.  No hydronephrosis or cortical thinning.  Bladder:  Normal.  IMPRESSION: Normal bilateral renal ultrasound except for a stable benign cyst in the lateral aspect of the right kidney.   Original Report Authenticated By: Francene Boyers,  M.D.     ASSESSMENT/PLAN: This is a very pleasant 52 years old African American male with recurrent multiple myeloma with recent progression after Pomalyst treatment and worsening renal function. He is currently being treated with Kyprolis (Carfilzomib) 20 mg/m2 on days 1, 2, 8, 9, 15 and 16 every 4 weeks.  He is also taking Decadron 40 mg by mouth on a weekly basis. He is status post one cycle.The patient was discussed with Dr. Arbutus Ped. His platelet count 80,000 however given the degree of disease progression the patient is experiencing he will proceed with day 1 and 2 of cycle 2 of Kyprolis as scheduled. Patient was advised to get in touch with his nephrologist regarding his antihypertensive medications. It is likely he was taken off of the Avalide due to the diuretic component. His blood pressure is significantly better than it has been on prior visits a reading of 111/63 today. He'll return in one week for another symptom management visit with a repeat CBC differential and C. met.   [name], Shyvonne Chastang E, PA-C   All questions were answered. The patient knows to call the clinic with any problems, questions or concerns. We can certainly see the patient much sooner if necessary.  I spent 20 minutes counseling the patient face to face. The total time spent in the appointment was 30 minutes.

## 2013-03-12 ENCOUNTER — Telehealth: Payer: Self-pay | Admitting: Medical Oncology

## 2013-03-12 DIAGNOSIS — I776 Arteritis, unspecified: Secondary | ICD-10-CM

## 2013-03-12 MED ORDER — CLOBETASOL PROPIONATE 0.05 % EX CREA: TOPICAL_CREAM | Freq: Two times a day (BID) | CUTANEOUS | Status: DC

## 2013-03-12 NOTE — Telephone Encounter (Signed)
Pt requests refill for refill on clebatesol  for recurring lesions on abdomen, vasculitis per Adrena.

## 2013-03-15 ENCOUNTER — Other Ambulatory Visit (HOSPITAL_BASED_OUTPATIENT_CLINIC_OR_DEPARTMENT_OTHER): Payer: 59

## 2013-03-15 ENCOUNTER — Telehealth: Payer: Self-pay | Admitting: Internal Medicine

## 2013-03-15 ENCOUNTER — Ambulatory Visit: Payer: 59 | Admitting: Pharmacist

## 2013-03-15 ENCOUNTER — Other Ambulatory Visit: Payer: 59 | Admitting: Lab

## 2013-03-15 ENCOUNTER — Ambulatory Visit (HOSPITAL_BASED_OUTPATIENT_CLINIC_OR_DEPARTMENT_OTHER): Payer: 59 | Admitting: Physician Assistant

## 2013-03-15 ENCOUNTER — Ambulatory Visit (HOSPITAL_BASED_OUTPATIENT_CLINIC_OR_DEPARTMENT_OTHER): Payer: 59

## 2013-03-15 VITALS — BP 134/78 | HR 78 | Temp 97.3°F | Resp 20 | Ht 63.0 in | Wt 213.6 lb

## 2013-03-15 DIAGNOSIS — C9002 Multiple myeloma in relapse: Secondary | ICD-10-CM

## 2013-03-15 DIAGNOSIS — I776 Arteritis, unspecified: Secondary | ICD-10-CM

## 2013-03-15 DIAGNOSIS — C9000 Multiple myeloma not having achieved remission: Secondary | ICD-10-CM

## 2013-03-15 DIAGNOSIS — R5383 Other fatigue: Secondary | ICD-10-CM

## 2013-03-15 DIAGNOSIS — N289 Disorder of kidney and ureter, unspecified: Secondary | ICD-10-CM

## 2013-03-15 DIAGNOSIS — Z5112 Encounter for antineoplastic immunotherapy: Secondary | ICD-10-CM

## 2013-03-15 LAB — CBC WITH DIFFERENTIAL/PLATELET
Basophils Absolute: 0 10*3/uL (ref 0.0–0.1)
Eosinophils Absolute: 0.1 10*3/uL (ref 0.0–0.5)
HGB: 8.3 g/dL — ABNORMAL LOW (ref 13.0–17.1)
MCV: 88.7 fL (ref 79.3–98.0)
MONO#: 1 10*3/uL — ABNORMAL HIGH (ref 0.1–0.9)
MONO%: 22.4 % — ABNORMAL HIGH (ref 0.0–14.0)
NEUT#: 2.6 10*3/uL (ref 1.5–6.5)
Platelets: 73 10*3/uL — ABNORMAL LOW (ref 140–400)
RDW: 16.9 % — ABNORMAL HIGH (ref 11.0–14.6)

## 2013-03-15 LAB — PROTIME-INR

## 2013-03-15 LAB — COMPREHENSIVE METABOLIC PANEL (CC13)
Albumin: 2.1 g/dL — ABNORMAL LOW (ref 3.5–5.0)
CO2: 18 mEq/L — ABNORMAL LOW (ref 22–29)
Calcium: 7.8 mg/dL — ABNORMAL LOW (ref 8.4–10.4)
Chloride: 111 mEq/L — ABNORMAL HIGH (ref 98–107)
Glucose: 102 mg/dl — ABNORMAL HIGH (ref 70–99)
Sodium: 134 mEq/L — ABNORMAL LOW (ref 136–145)
Total Bilirubin: 0.67 mg/dL (ref 0.20–1.20)
Total Protein: 11.3 g/dL — ABNORMAL HIGH (ref 6.4–8.3)

## 2013-03-15 LAB — POCT INR: INR: 3.91

## 2013-03-15 LAB — PROTHROMBIN TIME: Prothrombin Time: 36 seconds — ABNORMAL HIGH (ref 11.6–15.2)

## 2013-03-15 LAB — HOLD TUBE, BLOOD BANK

## 2013-03-15 MED ORDER — SODIUM CHLORIDE 0.9 % IJ SOLN
10.0000 mL | INTRAMUSCULAR | Status: DC | PRN
  Administered 2013-03-15: 10 mL
  Filled 2013-03-15: qty 10

## 2013-03-15 MED ORDER — DEXAMETHASONE SODIUM PHOSPHATE 10 MG/ML IJ SOLN
10.0000 mg | Freq: Once | INTRAMUSCULAR | Status: AC
  Administered 2013-03-15: 10 mg via INTRAVENOUS

## 2013-03-15 MED ORDER — HEPARIN SOD (PORK) LOCK FLUSH 100 UNIT/ML IV SOLN
500.0000 [IU] | Freq: Once | INTRAVENOUS | Status: AC | PRN
  Administered 2013-03-15: 500 [IU]
  Filled 2013-03-15: qty 5

## 2013-03-15 MED ORDER — DEXTROSE 5 % IV SOLN
27.0000 mg/m2 | Freq: Once | INTRAVENOUS | Status: AC
  Administered 2013-03-15: 54 mg via INTRAVENOUS
  Filled 2013-03-15: qty 27

## 2013-03-15 MED ORDER — SODIUM CHLORIDE 0.9 % IV SOLN
Freq: Once | INTRAVENOUS | Status: AC
  Administered 2013-03-15: 13:00:00 via INTRAVENOUS

## 2013-03-15 MED ORDER — ONDANSETRON 8 MG/50ML IVPB (CHCC)
8.0000 mg | Freq: Once | INTRAVENOUS | Status: AC
  Administered 2013-03-15: 8 mg via INTRAVENOUS

## 2013-03-15 NOTE — Patient Instructions (Signed)
[facility] Discharge Instructions for Patients Receiving Chemotherapy  Today you received the following chemotherapy agents Kyprolis To help prevent nausea and vomiting after your treatment, we encourage you to take your nausea medication as prescribed.  If you develop nausea and vomiting that is not controlled by your nausea medication, call the clinic. If it is after clinic hours your family physician or the after hours number for the clinic or go to the Emergency Department.   BELOW ARE SYMPTOMS THAT SHOULD BE REPORTED IMMEDIATELY:  *FEVER GREATER THAN 100.5 F  *CHILLS WITH OR WITHOUT FEVER  NAUSEA AND VOMITING THAT IS NOT CONTROLLED WITH YOUR NAUSEA MEDICATION  *UNUSUAL SHORTNESS OF BREATH  *UNUSUAL BRUISING OR BLEEDING  TENDERNESS IN MOUTH AND THROAT WITH OR WITHOUT PRESENCE OF ULCERS  *URINARY PROBLEMS  *BOWEL PROBLEMS  UNUSUAL RASH Items with * indicate a potential emergency and should be followed up as soon as possible.  One of the nurses will contact you 24 hours after your treatment. Please let the nurse know about any problems that you may have experienced. Feel free to call the clinic you have any questions or concerns. The clinic phone number is [phone].   I have been informed and understand all the instructions given to me. I know to contact the clinic, my physician, or go to the Emergency Department if any problems should occur. I do not have any questions at this time, but understand that I may call the clinic during office hours   should I have any questions or need assistance in obtaining follow up care.    __________________________________________  _____________  __________ Signature of Patient or Authorized Representative            Date                   Time    __________________________________________ Nurse's Signature    

## 2013-03-15 NOTE — Progress Notes (Signed)
Ok to treat with platelets 73 and hgb 8.3 per [name]

## 2013-03-15 NOTE — Progress Notes (Signed)
INR elevated today.  Mr [name] does not report any bleeding or changes in medication.  Mr [name] has been on current coumadin dose for > 1 month.  Mr [name] to hold coumadin today then resume current coumadin dose of 5mg  T/TH/Sat and 7.5mg  other days.  Will check PT/INR in 1 week.

## 2013-03-16 ENCOUNTER — Encounter (HOSPITAL_COMMUNITY): Payer: Self-pay | Admitting: Otolaryngology

## 2013-03-16 ENCOUNTER — Ambulatory Visit: Payer: 59 | Admitting: Lab

## 2013-03-16 ENCOUNTER — Encounter (HOSPITAL_COMMUNITY): Payer: Self-pay | Admitting: *Deleted

## 2013-03-16 ENCOUNTER — Ambulatory Visit (HOSPITAL_BASED_OUTPATIENT_CLINIC_OR_DEPARTMENT_OTHER): Payer: 59

## 2013-03-16 ENCOUNTER — Other Ambulatory Visit: Payer: Self-pay | Admitting: Pharmacist

## 2013-03-16 ENCOUNTER — Emergency Department (HOSPITAL_COMMUNITY)
Admission: EM | Admit: 2013-03-16 | Discharge: 2013-03-17 | Disposition: A | Discharge status: Home / Self Care | Payer: 59 | Attending: Emergency Medicine | Admitting: Emergency Medicine

## 2013-03-16 ENCOUNTER — Ambulatory Visit (HOSPITAL_BASED_OUTPATIENT_CLINIC_OR_DEPARTMENT_OTHER): Payer: 59 | Admitting: Pharmacist

## 2013-03-16 ENCOUNTER — Telehealth: Payer: Self-pay | Admitting: Medical Oncology

## 2013-03-16 VITALS — BP 127/81 | HR 70 | Temp 97.1°F | Resp 18

## 2013-03-16 DIAGNOSIS — Z7901 Long term (current) use of anticoagulants: Secondary | ICD-10-CM | POA: Insufficient documentation

## 2013-03-16 DIAGNOSIS — I776 Arteritis, unspecified: Secondary | ICD-10-CM

## 2013-03-16 DIAGNOSIS — Z79899 Other long term (current) drug therapy: Secondary | ICD-10-CM | POA: Insufficient documentation

## 2013-03-16 DIAGNOSIS — R040 Epistaxis: Secondary | ICD-10-CM

## 2013-03-16 DIAGNOSIS — C9002 Multiple myeloma in relapse: Secondary | ICD-10-CM

## 2013-03-16 DIAGNOSIS — C9000 Multiple myeloma not having achieved remission: Secondary | ICD-10-CM

## 2013-03-16 DIAGNOSIS — Z5112 Encounter for antineoplastic immunotherapy: Secondary | ICD-10-CM

## 2013-03-16 DIAGNOSIS — I10 Essential (primary) hypertension: Secondary | ICD-10-CM | POA: Insufficient documentation

## 2013-03-16 LAB — COMPREHENSIVE METABOLIC PANEL
ALT: 15 U/L (ref 0–53)
AST: 11 U/L (ref 0–37)
Alkaline Phosphatase: 31 U/L — ABNORMAL LOW (ref 39–117)
CO2: 16 mEq/L — ABNORMAL LOW (ref 19–32)
Calcium: 8.2 mg/dL — ABNORMAL LOW (ref 8.4–10.5)
GFR calc non Af Amer: 66 mL/min — ABNORMAL LOW (ref >90)
Glucose, Bld: 108 mg/dL — ABNORMAL HIGH (ref 70–99)
Potassium: 3.8 mEq/L (ref 3.5–5.1)
Sodium: 136 mEq/L (ref 135–145)
Total Protein: 10.6 g/dL — ABNORMAL HIGH (ref 6.0–8.3)

## 2013-03-16 LAB — PROTIME-INR: Prothrombin Time: 31.8 seconds — ABNORMAL HIGH (ref 11.6–15.2)

## 2013-03-16 LAB — CBC WITH DIFFERENTIAL/PLATELET
Basophils Absolute: 0 10*3/uL (ref 0.0–0.1)
Basophils Relative: 0 % (ref 0–1)
Eosinophils Absolute: 0 10*3/uL (ref 0.0–0.7)
Eosinophils Relative: 0 % (ref 0–5)
Hemoglobin: 7.4 g/dL — ABNORMAL LOW (ref 13.0–17.0)
Lymphocytes Relative: 13 % (ref 12–46)
Lymphs Abs: 1 10*3/uL (ref 0.7–4.0)
Monocytes Absolute: 0.4 10*3/uL (ref 0.1–1.0)
Monocytes Relative: 5 % (ref 3–12)
Neutro Abs: 6.3 10*3/uL (ref 1.7–7.7)
Neutrophils Relative %: 70 % (ref 43–77)
Platelets: 83 10*3/uL — ABNORMAL LOW (ref 150–400)
RBC: 2.52 MIL/uL — ABNORMAL LOW (ref 4.22–5.81)
WBC: 7.7 10*3/uL (ref 4.0–10.5)
nRBC: 1 /100 WBC — ABNORMAL HIGH

## 2013-03-16 LAB — CBC
HCT: 22.9 % — ABNORMAL LOW (ref 39.0–52.0)
Hemoglobin: 7.8 g/dL — ABNORMAL LOW (ref 13.0–17.0)
MCH: 29.5 pg (ref 26.0–34.0)
MCHC: 34.1 g/dL (ref 30.0–36.0)
RDW: 16.8 % — ABNORMAL HIGH (ref 11.5–15.5)

## 2013-03-16 MED ORDER — DEXAMETHASONE SODIUM PHOSPHATE 10 MG/ML IJ SOLN
10.0000 mg | Freq: Once | INTRAMUSCULAR | Status: AC
  Administered 2013-03-16: 10 mg via INTRAVENOUS

## 2013-03-16 MED ORDER — DEXTROSE 5 % IV SOLN
27.0000 mg/m2 | Freq: Once | INTRAVENOUS | Status: AC
  Administered 2013-03-16: 54 mg via INTRAVENOUS
  Filled 2013-03-16: qty 27

## 2013-03-16 MED ORDER — SODIUM CHLORIDE 0.9 % IV SOLN
Freq: Once | INTRAVENOUS | Status: AC
  Administered 2013-03-16: 10:00:00 via INTRAVENOUS

## 2013-03-16 MED ORDER — PHYTONADIONE 5 MG PO TABS
2.5000 mg | ORAL_TABLET | Freq: Once | ORAL | Status: AC
  Administered 2013-03-16: 2.5 mg via ORAL
  Filled 2013-03-16: qty 1

## 2013-03-16 MED ORDER — ONDANSETRON 8 MG/50ML IVPB (CHCC)
8.0000 mg | Freq: Once | INTRAVENOUS | Status: AC
  Administered 2013-03-16: 8 mg via INTRAVENOUS

## 2013-03-16 MED ORDER — HEPARIN SOD (PORK) LOCK FLUSH 100 UNIT/ML IV SOLN
500.0000 [IU] | Freq: Once | INTRAVENOUS | Status: AC | PRN
  Administered 2013-03-16: 500 [IU]
  Filled 2013-03-16: qty 5

## 2013-03-16 MED ORDER — SODIUM CHLORIDE 0.9 % IJ SOLN
10.0000 mL | INTRAMUSCULAR | Status: DC | PRN
  Administered 2013-03-16: 10 mL
  Filled 2013-03-16: qty 10

## 2013-03-16 NOTE — Patient Instructions (Addendum)
[facility] Discharge Instructions for Patients Receiving Chemotherapy  Today you received the following chemotherapy agents Kyprolis.  To help prevent nausea and vomiting after your treatment, we encourage you to take your nausea medication as prescribed.   If you develop nausea and vomiting that is not controlled by your nausea medication, call the clinic. If it is after clinic hours your family physician or the after hours number for the clinic or go to the Emergency Department.   BELOW ARE SYMPTOMS THAT SHOULD BE REPORTED IMMEDIATELY:  *FEVER GREATER THAN 100.5 F  *CHILLS WITH OR WITHOUT FEVER  NAUSEA AND VOMITING THAT IS NOT CONTROLLED WITH YOUR NAUSEA MEDICATION  *UNUSUAL SHORTNESS OF BREATH  *UNUSUAL BRUISING OR BLEEDING  TENDERNESS IN MOUTH AND THROAT WITH OR WITHOUT PRESENCE OF ULCERS  *URINARY PROBLEMS  *BOWEL PROBLEMS  UNUSUAL RASH Items with * indicate a potential emergency and should be followed up as soon as possible.  Feel free to call the clinic you have any questions or concerns. The clinic phone number is [phone].   I have been informed and understand all the instructions given to me. I know to contact the clinic, my physician, or go to the Emergency Department if any problems should occur. I do not have any questions at this time, but understand that I may call the clinic during office hours   should I have any questions or need assistance in obtaining follow up care.    __________________________________________  _____________  __________ Signature of Patient or Authorized Representative            Date                   Time    __________________________________________ Nurse's Signature    

## 2013-03-16 NOTE — Telephone Encounter (Signed)
Pt wife called and reported his nose is still bleeding. I called pt and told him that Dr Arbutus Ped wants him to be evaluated in ED

## 2013-03-16 NOTE — ED Notes (Signed)
Patient still having bleeding from both nostrils.  Patient held pressure over 25 min.  on upper nasal area.

## 2013-03-16 NOTE — ED Provider Notes (Signed)
History     CSN: [id number]  Arrival date & time 03/16/13  1531   First MD Initiated Contact with Patient 03/16/13 1821      Chief Complaint  Patient presents with  . Epistaxis    (Consider location/radiation/quality/duration/timing/severity/associated sxs/prior treatment) HPI  Patient presents to the ED with complaints of nosebleed since 9:30am this morning. He had chemotherapy for his multiple myeloma this morning and was given PO vitamin K. He was told to stop taking his wafarin on Monday. He has a history of nosebleeds for which they cauterized his nose and burned through his septum. The bleeding is coming out both nostrils but not down his throat unless he is laying backwards.   Past Medical History  Diagnosis Date  . Multiple myeloma(203.0) 09/24/2011  . Hypertension     Past Surgical History  Procedure Laterality Date  . Video bronchoscopy  12/11/2012    Procedure: VIDEO BRONCHOSCOPY;  Surgeon: Kerin Perna, MD;  Location: [facility] OR;  Service: Thoracic;  Laterality: N/A;  . Video assisted thoracoscopy  12/11/2012    Procedure: VIDEO ASSISTED THORACOSCOPY;  Surgeon: Kerin Perna, MD;  Location: [facility] OR;  Service: Thoracic;  Laterality: Right;  . Decortication  12/11/2012    Procedure: DECORTICATION;  Surgeon: Kerin Perna, MD;  Location: [facility] OR;  Service: Thoracic;  Laterality: N/A;    Family History  Problem Relation Age of Onset  . Diabetic kidney disease Mother   . Hypertension Mother   . Hypertension Father   . Kidney failure Father     History  Substance Use Topics  . Smoking status: Never Smoker   . Smokeless tobacco: Never Used  . Alcohol Use: No      Review of Systems  Review of Systems  Gen: no weight loss, fevers, chills, night sweats  Eyes: no discharge or drainage, no occular pain or visual changes  Nose: + pistaxis NO Rinorrhea  Mouth: no dental pain, no sore throat  Neck: no neck pain  Lungs:No wheezing, coughing or hemoptysis CV: no  chest pain, palpitations, dependent edema or orthopnea  Abd: no abdominal pain, nausea, vomiting  GU: no dysuria or gross hematuria  MSK:  No abnormalities  Neuro: no headache, no focal neurologic deficits  Skin: no abnormalities Psyche: negative.   Allergies  Red dye and Other  Home Medications   Current Outpatient Rx  Name  Route  Sig  Dispense  Refill  . amLODipine (NORVASC) 5 MG tablet   Oral   Take 5 mg by mouth daily.         . clobetasol cream (TEMOVATE) 0.05 %   Topical   Apply topically 2 (two) times daily. Rub on affected skin areas as directed   30 g   0   . cloNIDine (CATAPRES) 0.1 MG tablet   Oral   Take 0.1 mg by mouth 2 (two) times daily.          [name] gabapentin (NEURONTIN) 300 MG capsule   Oral   Take 300 mg by mouth 3 (three) times daily.         [name] HYDROcodone-acetaminophen (NORCO) 10-325 MG per tablet   Oral   Take 1-2 tablets by mouth every 6 (six) hours as needed for pain. For pain.   40 tablet   0   . irbesartan-hydrochlorothiazide (AVALIDE) 150-12.5 MG per tablet   Oral   Take 1 tablet by mouth daily.         [name] losartan (COZAAR) 100  MG tablet   Oral   Take 100 mg by mouth every morning.          [name] omeprazole (PRILOSEC) 40 MG capsule   Oral   Take 40 mg by mouth daily.         [name] warfarin (COUMADIN) 5 MG tablet   Oral   Take by mouth as directed. Take 7.5mg  daily except 5mg  on TuThuSa           BP 138/83  Pulse 69  Temp(Src) 97.6 F (36.4 C) (Oral)  Resp 18  SpO2 100%  Physical Exam  Nursing note and vitals reviewed. Constitutional: He appears well-developed and well-nourished. No distress.  HENT:  Head: Normocephalic and atraumatic.  Nose: Epistaxis is observed.  Pt is missing distal septum in nose.  Eyes: Pupils are equal, round, and reactive to light.  Neck: Normal range of motion. Neck supple.  Cardiovascular: Normal rate and regular rhythm.   Pulmonary/Chest: Effort normal.  Abdominal: Soft.   Neurological: He is alert.  Skin: Skin is warm and dry.    ED Course  Procedures (including critical care time)  Labs Reviewed  CBC WITH DIFFERENTIAL - Abnormal; Notable for the following:    RBC 2.52 (*)    Hemoglobin 7.4 (*)    HCT 21.7 (*)    RDW 16.5 (*)    Platelets 83 (*)    nRBC 1 (*)    All other components within normal limits  PROTIME-INR - Abnormal; Notable for the following:    Prothrombin Time 31.8 (*)    INR 3.31 (*)    All other components within normal limits  COMPREHENSIVE METABOLIC PANEL - Abnormal; Notable for the following:    CO2 16 (*)    Glucose, Bld 108 (*)    Calcium 8.2 (*)    Total Protein 10.6 (*)    Albumin 2.2 (*)    Alkaline Phosphatase 31 (*)    Total Bilirubin 0.2 (*)    GFR calc non Af Amer 66 (*)    GFR calc Af Amer 77 (*)    All other components within normal limits  CBC - Abnormal; Notable for the following:    RBC 2.64 (*)    Hemoglobin 7.8 (*)    HCT 22.9 (*)    RDW 16.8 (*)    Platelets 72 (*)    All other components within normal limits  PATHOLOGIST SMEAR REVIEW   No results found.   1. Multiple myeloma   2. Epistaxis       MDM  Patients hemoglobin was 8 this morning and has dropped to 7.4. He is also tachycardic at 108.   Dr. Rubin Payor has evaluated patient as well. Will place nasal packing bilaterally.    Difficulty packing nose. Dr. Rubin Payor adjusted packing so that it was more effective. Patient vitals stable prior to discharge. He was up and ambulating without difficulty. Will order repeat cbc to recheck hemoglobin and platelets.    Patients hemoglobin has improved from 7.4 to 7.8. His platelets did decrease from 83,000 to 72,000. Pt would like to  Go home. Will be referred back to his oncologist and ENT.   Filed Vitals:   03/16/13 2352  BP: 138/83  Pulse: 69  Temp: 97.6 F (36.4 C)  Resp: 18    Pt has been advised of the symptoms that warrant their return to the ED. Patient has voiced understanding  and has agreed to follow-up with the PCP or specialist.  Dorthula Matas, PA-C 03/17/13 0007

## 2013-03-16 NOTE — Progress Notes (Signed)
Mr Andreoni asked to see me from infusion room today.  He stated that he has had a nose bleed since he left yesterday.  INR drawn today = 3.44.  Mr. Tigges still having nose bleeds in infusion room.  Vit K 2.5mg  po given today in infusion room.  Coumadin to be held until we see him again on 03/19/13.  Plan discussed with Dr. Arbutus Ped.

## 2013-03-16 NOTE — ED Notes (Signed)
Pt from home with intermittent nosebleed that started Monday. Pt reports hx of Multiple Myeloma and is receiving chemo. Pt also reports being taken off Warfarin on Sunday and being given Vitamin K today at [facility].

## 2013-03-17 ENCOUNTER — Telehealth: Payer: Self-pay | Admitting: Medical Oncology

## 2013-03-17 NOTE — ED Provider Notes (Signed)
Medical screening examination/treatment/procedure(s) were conducted as a shared visit with non-physician practitioner(s) and myself.  I personally evaluated the patient during the encounter. Patient with a the epistaxis and chronic perforated septum. He also has multiple myeloma. Bleeding controlled with bilateral balloons. hgb stable here  [name]. Rubin Payor, MD 03/17/13 [id number]

## 2013-03-17 NOTE — Telephone Encounter (Signed)
[name] called to report pt 's nose was packed yesterday in Ed and she was asking if Dr [facility] amed can remove packing in 2 days. I called pt and told him that Dr Arbutus Ped will not remove packing and he needs to go back to ED to get packing removed

## 2013-03-17 NOTE — Progress Notes (Signed)
[name] [phone]   Fax:([phone]  OFFICE PROGRESS NOTE  [name], [name]  Principle Diagnosis:  #1 recurrent multiple myeloma IgG kappa subtype diagnosed in June of 2008  #2 history of vasculitis and thrombosis of skin lesions   Prior Therapy:  #1 status post palliative radiotherapy to the left hip under the care of Dr. Roselind Messier  #2 status post 5 cycles of systemic chemotherapy with Revlimid and low dose Decadron. Last dose given June 2009 with good response.  #3 status post autologous peripheral blood stem cell transplant at [facility] on 02/02/2008.  #4 the patient had evidence for disease recurrence in December 2010.  #5 Revlimid 25 mg by mouth daily for 21 days every 4 weeks in addition to Decadron 40 mg orally on a weekly basis. The patient is status post 28 cycles, discontinued today secondary to disease progression.  #6 Systemic chemotherapy with Velcade 1.3 mg/M2 on days 1, 4, 8 and 11 in addition to Doxil 30 mg/M2 on day 4 and Decadron 40 mg by mouth on weekly basis every 3 weeks. Status post 3 cycles, last dose was given 05/04/2012 discontinued secondary to intolerance.  #7 Salvage therapy treatment with the Pomalyst 4 mg by mouth daily for 21 days every 28 days as well as Cytoxan 50 mg by mouth every other day and dexamethasone 40 mg by mouth once weekly. Therapy beginning 09/25/2012, status post 2 cycles discontinued secondary to disease progression and intolerance  Current therapy:  1) Kyprolis (Carfilzomib) 20 mg/M2 on days 1, 2, 8, 9, 15 and 16 every 4 weeks. First dose on 02/08/2013. This would be concurrent with the dexamethasone 40 mg by mouth on a weekly basis. Status post 1 cycle as well as day 1 and 2 of cycle 2 2) Zometa 4 mg IV given every 3 months.   INTERVAL HISTORY: [name] 52 y.o. male returns to the clinic today for followup visit to proceed with  day 8 and 9 of cycle #2 of his Kyprolis. He reports that he has discontinued taking the Avalide and Norvasc and is now taking the losartan and clonidine as prescribed by his nephrologist for his hypertension. His blood pressure is become much more controlled and his headaches have subsided. In the interim since last being seen in the office he has developed a recurrence of the vasculitis on his abdomen. He is been using topical emollients as well as prescription Temovate 0.05% cream. Overall he is tolerating the Toprol is relatively well with the exception of some diarrhea. He currently is not having any diarrhea. He has been troubled with seasonal allergies and is having some upper respiratory congestion, not associated with fever or chills.  He complains of fatigue.He voiced no other specific complaints today.  He denied having any significant weight loss or night sweats. He denied having any chest pain, shortness breath, cough or hemoptysis.   MEDICAL HISTORY: Past Medical History  Diagnosis Date  . Multiple myeloma(203.0) 09/24/2011  . Hypertension     ALLERGIES:  is allergic to red dye and other.  MEDICATIONS:  Current Outpatient Prescriptions  Medication Sig Dispense Refill  . amLODipine (NORVASC) 5 MG tablet Take 5 mg by mouth daily.      . clobetasol cream (TEMOVATE) 0.05 % Apply topically 2 (two) times daily. Rub on affected skin areas as directed  30 g  0  . cloNIDine (CATAPRES) 0.1  MG tablet Take 0.1 mg by mouth 2 (two) times daily.       [name] gabapentin (NEURONTIN) 300 MG capsule Take 300 mg by mouth 3 (three) times daily.      [name] HYDROcodone-acetaminophen (NORCO) 10-325 MG per tablet Take 1-2 tablets by mouth every 6 (six) hours as needed for pain. For pain.  40 tablet  0  . irbesartan-hydrochlorothiazide (AVALIDE) 150-12.5 MG per tablet Take 1 tablet by mouth daily.      [name] losartan (COZAAR) 100 MG tablet Take 100 mg by mouth every morning.       [name] omeprazole (PRILOSEC) 40 MG capsule  Take 40 mg by mouth daily.      [name] warfarin (COUMADIN) 5 MG tablet Take by mouth as directed. Take 7.5mg  daily except 5mg  on TuThuSa       No current facility-administered medications for this visit.    SURGICAL HISTORY:  Past Surgical History  Procedure Laterality Date  . Video bronchoscopy  12/11/2012    Procedure: VIDEO BRONCHOSCOPY;  Surgeon: Kerin Perna, [name];  Location: [facility] OR;  Service: Thoracic;  Laterality: N/A;  . Video assisted thoracoscopy  12/11/2012    Procedure: VIDEO ASSISTED THORACOSCOPY;  Surgeon: Kerin Perna, [name];  Location: [facility] OR;  Service: Thoracic;  Laterality: Right;  . Decortication  12/11/2012    Procedure: DECORTICATION;  Surgeon: Kerin Perna, [name];  Location: [facility] OR;  Service: Thoracic;  Laterality: N/A;    REVIEW OF SYSTEMS:  A comprehensive review of systems was negative except for: Constitutional: positive for fatigue Gastrointestinal: positive for diarrhea Recurrent vasculitis on the abdomen   PHYSICAL EXAMINATION: General appearance: alert, cooperative and no distress Head: Normocephalic, without obvious abnormality, atraumatic Neck: no adenopathy Lymph nodes: Cervical, supraclavicular, and axillary nodes normal. Resp: clear to auscultation bilaterally Cardio: regular rate and rhythm, S1, S2 normal, no murmur, click, rub or gallop GI: soft, non-tender; bowel sounds normal; no masses,  no organomegaly and There is an approximately 2.5" x 0.75 of an inch area of vasculitis in the right lower abdominal area. This is the area where he previously had vasculitis. The bed of this lesion is filled with coagulated dark blood. There is no active bleeding, no serous sanguinous drainage and no evidence of purulent material. There is only minimal tenderness  Extremities: extremities normal, atraumatic, no cyanosis or edema Neurologic: Alert and oriented X 3, normal strength and tone. Normal symmetric reflexes. Normal coordination and gait Right anterior chest reveals  recently placed port a cath, incisions well-healed,nontender no evidence of infection  ECOG PERFORMANCE STATUS: 1 - Symptomatic but completely ambulatory  Blood pressure 134/78, pulse 78, temperature 97.3 F (36.3 C), temperature source Oral, resp. rate 20, height 5\' 3"  (1.6 m), weight 213 lb 9.6 oz (96.888 kg).  LABORATORY DATA: Lab Results  Component Value Date   WBC 7.0 03/16/2013   HGB 7.8* 03/16/2013   HCT 22.9* 03/16/2013   MCV 86.7 03/16/2013   PLT 72* 03/16/2013      Chemistry      Component Value Date/Time   NA 136 03/16/2013 1930   NA 134* 03/15/2013 1126   K 3.8 03/16/2013 1930   K 3.7 03/15/2013 1126   CL 110 03/16/2013 1930   CL 111* 03/15/2013 1126   CO2 16* 03/16/2013 1930   CO2 18* 03/15/2013 1126   BUN 22 03/16/2013 1930   BUN 16.9 03/15/2013 1126   CREATININE 1.24 03/16/2013 1930   CREATININE 1.2 03/15/2013 1126  Component Value Date/Time   CALCIUM 8.2* 03/16/2013 1930   CALCIUM 7.8* 03/15/2013 1126   ALKPHOS 31* 03/16/2013 1930   ALKPHOS 33* 03/15/2013 1126   AST 11 03/16/2013 1930   AST 12 03/15/2013 1126   ALT 15 03/16/2013 1930   ALT 14 03/15/2013 1126   BILITOT 0.2* 03/16/2013 1930   BILITOT 0.67 03/15/2013 1126       RADIOGRAPHIC STUDIES: Dg Chest 2 View  01/20/2013  *RADIOLOGY REPORT*  Clinical Data: Empyema.  CHEST - 2 VIEW  Comparison: December 30, 2012.  Findings: Cardiomediastinal silhouette appears normal.  Left lung is clear.  No definite pleural effusion is noted.  There is interval development of alveolar opacity involving the right upper lobe concerning for developing pneumonia.  IMPRESSION: Interval development of right upper lobe opacity concerning for pneumonia.  Follow-up radiographs are recommended to ensure resolution.   Original Report Authenticated By: Lupita Raider.,  M.D.    US Renal  02/03/2013  *RADIOLOGY REPORT*  Clinical Data: Renal insufficiency.  RENAL/URINARY TRACT ULTRASOUND COMPLETE  Comparison:  Ultrasound dated 09/21/2010  Findings:   Right Kidney:  15.6 cm in length.  Stable cyst in the lateral aspect of the right kidney.  No hydronephrosis.  No cortical thinning.  Left Kidney:  13.0 cm in length.  Normal.  No hydronephrosis or cortical thinning.  Bladder:  Normal.  IMPRESSION: Normal bilateral renal ultrasound except for a stable benign cyst in the lateral aspect of the right kidney.   Original Report Authenticated By: Francene Boyers, M.D.     ASSESSMENT/PLAN: This is a very pleasant 52 years old African American male with recurrent multiple myeloma with recent progression after Pomalyst treatment and worsening renal function. He is currently being treated with Kyprolis (Carfilzomib) 20 mg/m2 on days 1, 2, 8, 9, 15 and 16 every 4 weeks.  He is also taking Decadron 40 mg by mouth on a weekly basis. He is status post one cycle.The patient was discussed with Dr. Arbutus Ped. His platelet count is 73,000 however given the degree of disease progression the patient is experiencing he will proceed with day 8 and 9 of cycle 2 of Kyprolis as scheduled. He was advised to continue topical emollients to the area of vasculitis but not to apply anything to  any areas of raw skin. His INR was supratherapeutic at 3.31  today and this will be managed by the [facility] Coumadin clinic.  He'll followup with Dr. Arbutus Ped one week for another symptom management visit with a repeat CBC differential and C. met.   Laural Benes, Kallista Pae E, PA-C   All questions were answered. The patient knows to call the clinic with any problems, questions or concerns. We can certainly see the patient much sooner if necessary.  I spent 20 minutes counseling the patient face to face. The total time spent in the appointment was 30 minutes.

## 2013-03-17 NOTE — Patient Instructions (Addendum)
Followup with Dr. Arbutus Ped in one week for another symptom management visit

## 2013-03-19 ENCOUNTER — Encounter: Payer: Self-pay | Admitting: *Deleted

## 2013-03-19 ENCOUNTER — Encounter (HOSPITAL_COMMUNITY): Payer: Self-pay | Admitting: Emergency Medicine

## 2013-03-19 ENCOUNTER — Emergency Department (HOSPITAL_COMMUNITY)
Admission: EM | Admit: 2013-03-19 | Discharge: 2013-03-19 | Disposition: A | Discharge status: Home / Self Care | Payer: 59 | Attending: Emergency Medicine | Admitting: Emergency Medicine

## 2013-03-19 ENCOUNTER — Other Ambulatory Visit: Payer: Self-pay | Admitting: *Deleted

## 2013-03-19 DIAGNOSIS — Z7901 Long term (current) use of anticoagulants: Secondary | ICD-10-CM | POA: Insufficient documentation

## 2013-03-19 DIAGNOSIS — I10 Essential (primary) hypertension: Secondary | ICD-10-CM | POA: Insufficient documentation

## 2013-03-19 DIAGNOSIS — Z79899 Other long term (current) drug therapy: Secondary | ICD-10-CM | POA: Insufficient documentation

## 2013-03-19 DIAGNOSIS — R000 Tachycardia, unspecified: Secondary | ICD-10-CM | POA: Insufficient documentation

## 2013-03-19 DIAGNOSIS — C9000 Multiple myeloma not having achieved remission: Secondary | ICD-10-CM | POA: Insufficient documentation

## 2013-03-19 DIAGNOSIS — Z87898 Personal history of other specified conditions: Secondary | ICD-10-CM

## 2013-03-19 DIAGNOSIS — Z09 Encounter for follow-up examination after completed treatment for conditions other than malignant neoplasm: Secondary | ICD-10-CM | POA: Insufficient documentation

## 2013-03-19 NOTE — ED Notes (Signed)
Pt had nose bleed that started on Tuesday and came here to get nasal packing placed.  Pt was told to return today to get the packing removed.

## 2013-03-19 NOTE — ED Provider Notes (Signed)
History     CSN: [id number]  Arrival date & time 03/19/13  0800   First MD Initiated Contact with Patient 03/19/13 [phone]      No chief complaint on file.   HPI Patient presents to the emergency room to have his nasal packing removed. Patient was seen in the emergency room on Tuesday of this week. Patient has a history of multiple myeloma. He also had previously been on Coumadin STOPPED that on Sunday. Patient also has history of apparently prior epistaxis and a septal perforation. The patient was treated with bilateral anterior nasal packing in the emergency department. Bleeding stopped with that treatment. The patient was instructed to follow up with ENT to have the nasal packing removed. He called [facility] ENT and was told that his appointment would be next week. Patient also states he had issues with throwing them money.  Patient came to the emergency room today because he wanted the nasal packing removed. He denies any recurrent bleeding. Otherwise has no complaints.   Past Medical History  Diagnosis Date  . Multiple myeloma(203.0) 09/24/2011  . Hypertension     Past Surgical History  Procedure Laterality Date  . Video bronchoscopy  12/11/2012    Procedure: VIDEO BRONCHOSCOPY;  Surgeon: Kerin Perna, MD;  Location: [facility] OR;  Service: Thoracic;  Laterality: N/A;  . Video assisted thoracoscopy  12/11/2012    Procedure: VIDEO ASSISTED THORACOSCOPY;  Surgeon: Kerin Perna, MD;  Location: [facility] OR;  Service: Thoracic;  Laterality: Right;  . Decortication  12/11/2012    Procedure: DECORTICATION;  Surgeon: Kerin Perna, MD;  Location: [facility] OR;  Service: Thoracic;  Laterality: N/A;    Family History  Problem Relation Age of Onset  . Diabetic kidney disease Mother   . Hypertension Mother   . Hypertension Father   . Kidney failure Father     History  Substance Use Topics  . Smoking status: Never Smoker   . Smokeless tobacco: Never Used  . Alcohol Use: No      Review of Systems   All other systems reviewed and are negative.    Allergies  Red dye and Other  Home Medications   Current Outpatient Rx  Name  Route  Sig  Dispense  Refill  . amLODipine (NORVASC) 5 MG tablet   Oral   Take 5 mg by mouth daily.         . clobetasol cream (TEMOVATE) 0.05 %   Topical   Apply topically 2 (two) times daily. Rub on affected skin areas as directed   30 g   0   . cloNIDine (CATAPRES) 0.1 MG tablet   Oral   Take 0.1 mg by mouth 2 (two) times daily.          [name] gabapentin (NEURONTIN) 300 MG capsule   Oral   Take 300 mg by mouth 3 (three) times daily.         [name] HYDROcodone-acetaminophen (NORCO) 10-325 MG per tablet   Oral   Take 1-2 tablets by mouth every 6 (six) hours as needed for pain. For pain.   40 tablet   0   . irbesartan-hydrochlorothiazide (AVALIDE) 150-12.5 MG per tablet   Oral   Take 1 tablet by mouth daily.         [name] losartan (COZAAR) 100 MG tablet   Oral   Take 100 mg by mouth every morning.          [name] omeprazole (PRILOSEC) 40 MG capsule  Oral   Take 40 mg by mouth daily.         [name] warfarin (COUMADIN) 5 MG tablet   Oral   Take by mouth as directed. Take 7.5mg  daily except 5mg  on TuThuSa           BP 149/86  Pulse 109  Temp(Src) 98.5 F (36.9 C)  SpO2 100%  Physical Exam  Nursing note and vitals reviewed. Constitutional: He appears well-developed and well-nourished. No distress.  HENT:  Head: Normocephalic and atraumatic.  Right Ear: External ear normal.  Left Ear: External ear normal.  Anterior nasal packing bilateral nares, no bleeding  Eyes: Conjunctivae are normal. Right eye exhibits no discharge. Left eye exhibits no discharge. No scleral icterus.  Neck: Neck supple. No tracheal deviation present.  Cardiovascular: Regular rhythm.   Mild tachycardia  Pulmonary/Chest: Effort normal. No stridor. No respiratory distress.  Musculoskeletal: He exhibits no edema.  Neurological: He is alert. Cranial nerve deficit:  no gross deficits.  Skin: Skin is warm and dry. No rash noted.  Psychiatric: He has a normal mood and affect.    ED Course  Procedures (including critical care time)  Labs Reviewed - No data to display No results found.     MDM  I spoke with the [facility] ENT office. They scheduled the patient's appointment for next week because they generally require 5 days before they will remove the nasal packing. I explained this to the patient. I instructed him that it would not be appropriate for [place] to remove the nasal packing at this time in the emergency department. He should continue with the plan that he was given previously.       Celene Kras, MD 03/19/13 [phone]

## 2013-03-22 ENCOUNTER — Other Ambulatory Visit: Payer: Self-pay | Admitting: Oncology

## 2013-03-22 ENCOUNTER — Encounter: Payer: Self-pay | Admitting: Internal Medicine

## 2013-03-22 ENCOUNTER — Encounter (HOSPITAL_COMMUNITY)
Admission: RE | Admit: 2013-03-22 | Discharge: 2013-03-22 | Disposition: A | Discharge status: Home / Self Care | Payer: 59 | Source: Ambulatory Visit | Attending: Internal Medicine | Admitting: Internal Medicine

## 2013-03-22 ENCOUNTER — Ambulatory Visit (HOSPITAL_BASED_OUTPATIENT_CLINIC_OR_DEPARTMENT_OTHER): Payer: 59 | Admitting: Internal Medicine

## 2013-03-22 ENCOUNTER — Telehealth: Payer: Self-pay | Admitting: Internal Medicine

## 2013-03-22 ENCOUNTER — Other Ambulatory Visit: Payer: Self-pay | Admitting: *Deleted

## 2013-03-22 ENCOUNTER — Other Ambulatory Visit (HOSPITAL_BASED_OUTPATIENT_CLINIC_OR_DEPARTMENT_OTHER): Payer: 59 | Admitting: Lab

## 2013-03-22 ENCOUNTER — Ambulatory Visit: Payer: 59 | Admitting: Pharmacist

## 2013-03-22 VITALS — BP 163/88 | HR 93 | Temp 98.9°F | Resp 20 | Ht 63.0 in | Wt 215.7 lb

## 2013-03-22 DIAGNOSIS — I776 Arteritis, unspecified: Secondary | ICD-10-CM

## 2013-03-22 DIAGNOSIS — C9000 Multiple myeloma not having achieved remission: Secondary | ICD-10-CM

## 2013-03-22 DIAGNOSIS — T451X5A Adverse effect of antineoplastic and immunosuppressive drugs, initial encounter: Secondary | ICD-10-CM

## 2013-03-22 DIAGNOSIS — D6481 Anemia due to antineoplastic chemotherapy: Secondary | ICD-10-CM | POA: Insufficient documentation

## 2013-03-22 DIAGNOSIS — C9002 Multiple myeloma in relapse: Secondary | ICD-10-CM

## 2013-03-22 DIAGNOSIS — D649 Anemia, unspecified: Secondary | ICD-10-CM

## 2013-03-22 DIAGNOSIS — R040 Epistaxis: Secondary | ICD-10-CM

## 2013-03-22 LAB — PREPARE RBC (CROSSMATCH)

## 2013-03-22 LAB — CBC WITH DIFFERENTIAL/PLATELET
Basophils Absolute: 0 10*3/uL (ref 0.0–0.1)
Eosinophils Absolute: 0 10*3/uL (ref 0.0–0.5)
HCT: 19.3 % — ABNORMAL LOW (ref 38.4–49.9)
HGB: 6.4 g/dL — CL (ref 13.0–17.1)
LYMPH%: 18.3 % (ref 14.0–49.0)
MCH: 28.7 pg (ref 27.2–33.4)
MCV: 86.5 fL (ref 79.3–98.0)
MONO%: 9.4 % (ref 0.0–14.0)
NEUT#: 4.4 10*3/uL (ref 1.5–6.5)
NEUT%: 71.4 % (ref 39.0–75.0)
Platelets: 69 10*3/uL — ABNORMAL LOW (ref 140–400)

## 2013-03-22 LAB — COMPREHENSIVE METABOLIC PANEL (CC13)
Albumin: 2 g/dL — ABNORMAL LOW (ref 3.5–5.0)
Alkaline Phosphatase: 33 U/L — ABNORMAL LOW (ref 40–150)
BUN: 6.9 mg/dL — ABNORMAL LOW (ref 7.0–26.0)
Creatinine: 1.1 mg/dL (ref 0.7–1.3)
Glucose: 75 mg/dl (ref 70–99)
Potassium: 3.2 mEq/L — ABNORMAL LOW (ref 3.5–5.1)
Total Bilirubin: 1.22 mg/dL — ABNORMAL HIGH (ref 0.20–1.20)

## 2013-03-22 NOTE — Patient Instructions (Signed)
We'll discontinue day 15 and 16 of treatment with Carfilzomib this cycle. I will arrange for him to receive 2 units of packed rbc's transfusion. Followup in 2 weeks

## 2013-03-22 NOTE — Progress Notes (Signed)
INR = 1.2 Pt is off Coumadin & continues to have epistaxis. Pltc & Hgb low today.  Getting PRBC's today; no chemo. Per Dr. Arbutus Ped, continue holding Coumadin & recheck 5/12. Pt aware of plan, I s/w him in the exam room. Ebony Hail, Pharm.D., CPP 03/22/2013@12 :32 PM

## 2013-03-22 NOTE — Telephone Encounter (Signed)
per [facility] pt needed blood added for tomorrow

## 2013-03-22 NOTE — Progress Notes (Signed)
[facility] Telephone:(336) [phone]   Fax:(336) [phone]  OFFICE PROGRESS NOTE  [name], MD 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 Roselind Messier  #2 status post 5 cycles of systemic chemotherapy with Revlimid and low dose Decadron. Last dose given June 2009 with good response.  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  #4 the patient had evidence for disease recurrence in December 2010.  #5 Revlimid 25 mg by mouth daily for 21 days every 4 weeks in addition to Decadron 40 mg orally on a weekly basis. The patient is status post 28 cycles, discontinued today secondary to disease progression.  #6 Systemic chemotherapy with Velcade 1.3 mg/M2 on days 1, 4, 8 and 11 in addition to Doxil 30 mg/M2 on day 4 and Decadron 40 mg by mouth on weekly basis every 3 weeks. Status post 3 cycles, last dose was given 05/04/2012 discontinued secondary to intolerance.  #7 Salvage therapy treatment with the Pomalyst 4 mg by mouth daily for 21 days every 28 days as well as Cytoxan 50 mg by mouth every other day and dexamethasone 40 mg by mouth once weekly. Therapy beginning 09/25/2012, status post 2 cycles discontinued secondary to disease progression and intolerance   Current therapy:  1) Kyprolis (Carfilzomib) 20 mg/M2 on days 1, 2, 8, 9, 15 and 16 every 4 weeks. First dose on 02/08/2013. This would be concurrent with the dexamethasone 40 mg by mouth on a weekly basis. Status post 1 cycle as well as day 1 and 2 of cycle 2  2) Zometa 4 mg IV given every 3 months.   INTERVAL HISTORY: [name] 52 y.o. male returns to the clinic today for followup visit. The patient  is feeling fine today with no specific complaints except for fatigue. He was recently seen at the emergency Department for persistent epistaxis and has packing of his nose performed. He is scheduled to see ENT in the next few days for further evaluation of this problem. The patient has been on Coumadin for treatment of vasculitis and thrombosis of a skin lesion but this is currently on hold secondary to epistaxis and elevated PT/INR. The patient denied having any other significant complaints today specifically no nausea or vomiting, no fever or chills. He has no chest pain, shortness of breath, cough or hemoptysis.  MEDICAL HISTORY: Past Medical History  Diagnosis Date  . Multiple myeloma(203.0) 09/24/2011  . Hypertension     ALLERGIES:  is allergic to red dye and other.  MEDICATIONS:  Current Outpatient Prescriptions  Medication Sig Dispense Refill  . cloNIDine (CATAPRES) 0.1 MG tablet Take 0.1 mg by mouth at bedtime.       . gabapentin (NEURONTIN) 300 MG capsule Take 300 mg by mouth 3 (three) times daily.      [name] HYDROcodone-acetaminophen (NORCO) 10-325 MG per tablet Take 1-2 tablets by mouth every 6 (six) hours as needed for pain. For pain.  40 tablet  0  . losartan (COZAAR) 100 MG tablet Take 100 mg by mouth every morning.       [name] omeprazole (PRILOSEC) 40 MG capsule Take 40 mg by mouth daily as needed (for heartburn).       [name]  warfarin (COUMADIN) 5 MG tablet Take 5-7.5 mg by mouth daily. 1.5 tabs daily except for 1 tab on Tues, Thurs and Sat.       No current facility-administered medications for this visit.    SURGICAL HISTORY:  Past Surgical History  Procedure Laterality Date  . Video bronchoscopy  12/11/2012    Procedure: VIDEO BRONCHOSCOPY;  Surgeon: Kerin Perna, MD;  Location: [facility] OR;  Service: Thoracic;  Laterality: N/A;  . Video assisted thoracoscopy  12/11/2012    Procedure: VIDEO ASSISTED THORACOSCOPY;  Surgeon: Kerin Perna, MD;  Location: [facility] OR;  Service: Thoracic;   Laterality: Right;  . Decortication  12/11/2012    Procedure: DECORTICATION;  Surgeon: Kerin Perna, MD;  Location: [facility] OR;  Service: Thoracic;  Laterality: N/A;    REVIEW OF SYSTEMS:  A comprehensive review of systems was negative except for: Constitutional: positive for fatigue Ears, nose, mouth, throat, and face: positive for epistaxis   PHYSICAL EXAMINATION: General appearance: alert, cooperative, fatigued and no distress Head: Normocephalic, without obvious abnormality, atraumatic Neck: no adenopathy Lymph nodes: Cervical, supraclavicular, and axillary nodes normal. Resp: clear to auscultation bilaterally Cardio: regular rate and rhythm, S1, S2 normal, no murmur, click, rub or gallop GI: soft, non-tender; bowel sounds normal; no masses,  no organomegaly Extremities: extremities normal, atraumatic, no cyanosis or edema Neurologic: Alert and oriented X 3, normal strength and tone. Normal symmetric reflexes. Normal coordination and gait  ECOG PERFORMANCE STATUS: 1 - Symptomatic but completely ambulatory  Blood pressure 163/88, pulse 93, temperature 98.9 F (37.2 C), temperature source Oral, resp. rate 20, height 5\' 3"  (1.6 m), weight 215 lb 11.2 oz (97.841 kg).  LABORATORY DATA: Lab Results  Component Value Date   WBC 6.1 03/22/2013   HGB 6.4* 03/22/2013   HCT 19.3* 03/22/2013   MCV 86.5 03/22/2013   PLT 69* 03/22/2013      Chemistry      Component Value Date/Time   NA 136 03/16/2013 1930   NA 134* 03/15/2013 1126   K 3.8 03/16/2013 1930   K 3.7 03/15/2013 1126   CL 110 03/16/2013 1930   CL 111* 03/15/2013 1126   CO2 16* 03/16/2013 1930   CO2 18* 03/15/2013 1126   BUN 22 03/16/2013 1930   BUN 16.9 03/15/2013 1126   CREATININE 1.24 03/16/2013 1930   CREATININE 1.2 03/15/2013 1126      Component Value Date/Time   CALCIUM 8.2* 03/16/2013 1930   CALCIUM 7.8* 03/15/2013 1126   ALKPHOS 31* 03/16/2013 1930   ALKPHOS 33* 03/15/2013 1126   AST 11 03/16/2013 1930   AST 12 03/15/2013 1126   ALT  15 03/16/2013 1930   ALT 14 03/15/2013 1126   BILITOT 0.2* 03/16/2013 1930   BILITOT 0.67 03/15/2013 1126       RADIOGRAPHIC STUDIES: Dg Skull Complete  02/23/2013  *RADIOLOGY REPORT*  Clinical Data: Multiple myeloma.  SKULL - COMPLETE 4 + VIEW  Comparison: Bone survey 12/02/2011.  Findings: Diffuse lytic lesions of the skull are similar to the prior exam.  The paranasal sinuses and mastoid air cells are clear.  IMPRESSION: Similar appearance of diffuse lytic lesions in the skull compatible with multiple myeloma.   Original Report Authenticated By: Marin Roberts, M.D.     ASSESSMENT: This is a very pleasant 52 years old white male with recurrent multiple myeloma currently on treatment with Carfilzomib in addition to Decadron status post 1 cycle and he is currently undergoing treatment with a second cycle. The patient  has significant anemia secondary to recent epistaxis.  PLAN:  1) I would discontinue his current chemotherapy with Carfilzomib for day 15 and 16 for this cycle secondary to significant anemia and fatigue. The patient would come back for followup visit in 2 weeks with the start of cycle #3 2) for the anemia, I will arrange for the patient to received 2 units of packed rbc's transfusion. 3) followup in 2 weeks with the next cycle of his chemotherapy. He was advised to call immediately if he has any concerning symptoms in the interval.  All questions were answered. The patient knows to call the clinic with any problems, questions or concerns. We can certainly see the patient much sooner if necessary.  I spent 15 minutes counseling the patient face to face. The total time spent in the appointment was 25 minutes.

## 2013-03-23 ENCOUNTER — Ambulatory Visit (HOSPITAL_BASED_OUTPATIENT_CLINIC_OR_DEPARTMENT_OTHER): Payer: 59

## 2013-03-23 ENCOUNTER — Other Ambulatory Visit: Payer: Self-pay | Admitting: *Deleted

## 2013-03-23 ENCOUNTER — Ambulatory Visit: Payer: 59

## 2013-03-23 VITALS — BP 186/80 | HR 41 | Temp 97.9°F | Resp 18

## 2013-03-23 DIAGNOSIS — D500 Iron deficiency anemia secondary to blood loss (chronic): Secondary | ICD-10-CM

## 2013-03-23 DIAGNOSIS — T451X5A Adverse effect of antineoplastic and immunosuppressive drugs, initial encounter: Secondary | ICD-10-CM

## 2013-03-23 DIAGNOSIS — R040 Epistaxis: Secondary | ICD-10-CM

## 2013-03-23 MED ORDER — ACETAMINOPHEN 325 MG PO TABS
650.0000 mg | ORAL_TABLET | Freq: Once | ORAL | Status: AC
  Administered 2013-03-23: 650 mg via ORAL

## 2013-03-23 MED ORDER — HEPARIN SOD (PORK) LOCK FLUSH 100 UNIT/ML IV SOLN
500.0000 [IU] | Freq: Every day | INTRAVENOUS | Status: AC | PRN
  Administered 2013-03-23: 500 [IU]
  Filled 2013-03-23: qty 5

## 2013-03-23 MED ORDER — SODIUM CHLORIDE 0.9 % IJ SOLN
10.0000 mL | INTRAMUSCULAR | Status: AC | PRN
  Administered 2013-03-23: 10 mL
  Filled 2013-03-23: qty 10

## 2013-03-23 MED ORDER — DIPHENHYDRAMINE HCL 25 MG PO CAPS
25.0000 mg | ORAL_CAPSULE | Freq: Once | ORAL | Status: AC
  Administered 2013-03-23: 25 mg via ORAL

## 2013-03-23 NOTE — Progress Notes (Signed)
Discharged at 1745, alone, ambulatory in no distress.

## 2013-03-23 NOTE — Patient Instructions (Signed)
Blood Transfusion Information WHAT IS A BLOOD TRANSFUSION? A transfusion is the replacement of blood or some of its parts. Blood is made up of multiple cells which provide different functions.  Red blood cells carry oxygen and are used for blood loss replacement.  White blood cells fight against infection.  Platelets control bleeding.  Plasma helps clot blood.  Other blood products are available for specialized needs, such as hemophilia or other clotting disorders. BEFORE THE TRANSFUSION  Who gives blood for transfusions?   You may be able to donate blood to be used at a later date on yourself (autologous donation).  Relatives can be asked to donate blood. This is generally not any safer than if you have received blood from a stranger. The same precautions are taken to ensure safety when a relative's blood is donated.  Healthy volunteers who are fully evaluated to make sure their blood is safe. This is blood bank blood. Transfusion therapy is the safest it has ever been in the practice of medicine. Before blood is taken from a donor, a complete history is taken to make sure that person has no history of diseases nor engages in risky social behavior (examples are intravenous drug use or sexual activity with multiple partners). The donor's travel history is screened to minimize risk of transmitting infections, such as malaria. The donated blood is tested for signs of infectious diseases, such as HIV and hepatitis. The blood is then tested to be sure it is compatible with you in order to minimize the chance of a transfusion reaction. If you or a relative donates blood, this is often done in anticipation of surgery and is not appropriate for emergency situations. It takes many days to process the donated blood. RISKS AND COMPLICATIONS Although transfusion therapy is very safe and saves many lives, the main dangers of transfusion include:   Getting an infectious disease.  Developing a  transfusion reaction. This is an allergic reaction to something in the blood you were given. Every precaution is taken to prevent this. The decision to have a blood transfusion has been considered carefully by your caregiver before blood is given. Blood is not given unless the benefits outweigh the risks. AFTER THE TRANSFUSION  Right after receiving a blood transfusion, you will usually feel much better and more energetic. This is especially true if your red blood cells have gotten low (anemic). The transfusion raises the level of the red blood cells which carry oxygen, and this usually causes an energy increase.  The nurse administering the transfusion will monitor you carefully for complications. HOME CARE INSTRUCTIONS  No special instructions are needed after a transfusion. You may find your energy is better. Speak with your caregiver about any limitations on activity for underlying diseases you may have. SEEK MEDICAL CARE IF:   Your condition is not improving after your transfusion.  You develop redness or irritation at the intravenous (IV) site. SEEK IMMEDIATE MEDICAL CARE IF:  Any of the following symptoms occur over the next 12 hours:  Shaking chills.  You have a temperature by mouth above 102 F (38.9 C), not controlled by medicine.  Chest, back, or muscle pain.  People around you feel you are not acting correctly or are confused.  Shortness of breath or difficulty breathing.  Dizziness and fainting.  You get a rash or develop hives.  You have a decrease in urine output.  Your urine turns a dark color or changes to pink, red, or brown. Any of the following   symptoms occur over the next 10 days:  You have a temperature by mouth above 102 F (38.9 C), not controlled by medicine.  Shortness of breath.  Weakness after normal activity.  The white part of the eye turns yellow (jaundice).  You have a decrease in the amount of urine or are urinating less often.  Your  urine turns a dark color or changes to pink, red, or brown. Document Released: 11/01/2000 Document Revised: 01/27/2012 Document Reviewed: 06/20/2008 ExitCare Patient Information 2013 [employer].  

## 2013-03-23 NOTE — Progress Notes (Signed)
@  1715 Dr Shirline Frees notified of pts  Trending pulse rate, no orders given.   dmr

## 2013-03-24 LAB — TYPE AND SCREEN
ABO/RH(D): O POS
Antibody Screen: NEGATIVE

## 2013-03-29 ENCOUNTER — Other Ambulatory Visit (HOSPITAL_BASED_OUTPATIENT_CLINIC_OR_DEPARTMENT_OTHER): Payer: 59 | Admitting: Lab

## 2013-03-29 ENCOUNTER — Ambulatory Visit (HOSPITAL_BASED_OUTPATIENT_CLINIC_OR_DEPARTMENT_OTHER): Payer: 59 | Admitting: Pharmacist

## 2013-03-29 DIAGNOSIS — I776 Arteritis, unspecified: Secondary | ICD-10-CM

## 2013-03-29 LAB — PROTIME-INR: Protime: 14.4 Seconds — ABNORMAL HIGH (ref 10.6–13.4)

## 2013-03-29 LAB — POCT INR: INR: 1.2

## 2013-03-29 NOTE — Progress Notes (Signed)
Coumadin has been on hold since 03/15/13.  [name] does not report any more nose bleeds.  Will resume Coumadin at 5mg  daily and slowly titrate dose to a goal of around 2.  [name] will call if he has any bleeding.  Will check PT/INR will next scheduled chemo on 04/05/13.

## 2013-04-02 ENCOUNTER — Other Ambulatory Visit: Payer: Self-pay | Admitting: *Deleted

## 2013-04-02 DIAGNOSIS — C9000 Multiple myeloma not having achieved remission: Secondary | ICD-10-CM

## 2013-04-05 ENCOUNTER — Other Ambulatory Visit: Payer: Self-pay | Admitting: *Deleted

## 2013-04-05 ENCOUNTER — Ambulatory Visit (HOSPITAL_BASED_OUTPATIENT_CLINIC_OR_DEPARTMENT_OTHER): Payer: 59 | Admitting: Pharmacist

## 2013-04-05 ENCOUNTER — Other Ambulatory Visit (HOSPITAL_BASED_OUTPATIENT_CLINIC_OR_DEPARTMENT_OTHER): Payer: 59 | Admitting: Lab

## 2013-04-05 ENCOUNTER — Ambulatory Visit (HOSPITAL_BASED_OUTPATIENT_CLINIC_OR_DEPARTMENT_OTHER): Payer: 59 | Admitting: Internal Medicine

## 2013-04-05 ENCOUNTER — Encounter: Payer: Self-pay | Admitting: Internal Medicine

## 2013-04-05 ENCOUNTER — Ambulatory Visit (HOSPITAL_BASED_OUTPATIENT_CLINIC_OR_DEPARTMENT_OTHER): Payer: 59

## 2013-04-05 ENCOUNTER — Other Ambulatory Visit: Payer: 59 | Admitting: Lab

## 2013-04-05 VITALS — BP 144/81 | HR 94 | Temp 97.4°F | Resp 18 | Ht 63.0 in | Wt 218.6 lb

## 2013-04-05 DIAGNOSIS — I776 Arteritis, unspecified: Secondary | ICD-10-CM

## 2013-04-05 DIAGNOSIS — C9002 Multiple myeloma in relapse: Secondary | ICD-10-CM

## 2013-04-05 DIAGNOSIS — Z5112 Encounter for antineoplastic immunotherapy: Secondary | ICD-10-CM

## 2013-04-05 DIAGNOSIS — E876 Hypokalemia: Secondary | ICD-10-CM

## 2013-04-05 DIAGNOSIS — D649 Anemia, unspecified: Secondary | ICD-10-CM

## 2013-04-05 DIAGNOSIS — R5381 Other malaise: Secondary | ICD-10-CM

## 2013-04-05 DIAGNOSIS — T451X5A Adverse effect of antineoplastic and immunosuppressive drugs, initial encounter: Secondary | ICD-10-CM

## 2013-04-05 DIAGNOSIS — C9000 Multiple myeloma not having achieved remission: Secondary | ICD-10-CM

## 2013-04-05 LAB — CBC WITH DIFFERENTIAL/PLATELET
BASO%: 0.7 % (ref 0.0–2.0)
Basophils Absolute: 0.1 10*3/uL (ref 0.0–0.1)
EOS%: 0.9 % (ref 0.0–7.0)
HCT: 22.7 % — ABNORMAL LOW (ref 38.4–49.9)
HGB: 7.4 g/dL — ABNORMAL LOW (ref 13.0–17.1)
LYMPH%: 26.2 % (ref 14.0–49.0)
MCH: 29.4 pg (ref 27.2–33.4)
MCHC: 32.6 g/dL (ref 32.0–36.0)
MCV: 90.1 fL (ref 79.3–98.0)
MONO%: 12.4 % (ref 0.0–14.0)
NEUT%: 59.8 % (ref 39.0–75.0)
Platelets: 108 10*3/uL — ABNORMAL LOW (ref 140–400)
lymph#: 1.8 10*3/uL (ref 0.9–3.3)

## 2013-04-05 LAB — COMPREHENSIVE METABOLIC PANEL (CC13)
ALT: 7 U/L (ref 0–55)
AST: 15 U/L (ref 5–34)
Alkaline Phosphatase: 37 U/L — ABNORMAL LOW (ref 40–150)
BUN: 7 mg/dL (ref 7.0–26.0)
Calcium: 8.9 mg/dL (ref 8.4–10.4)
Creatinine: 0.8 mg/dL (ref 0.7–1.3)
Total Bilirubin: 0.92 mg/dL (ref 0.20–1.20)

## 2013-04-05 LAB — HOLD TUBE, BLOOD BANK

## 2013-04-05 LAB — PROTIME-INR

## 2013-04-05 LAB — POCT INR: INR: 1.4

## 2013-04-05 MED ORDER — DEXAMETHASONE SODIUM PHOSPHATE 10 MG/ML IJ SOLN
10.0000 mg | Freq: Once | INTRAMUSCULAR | Status: AC
  Administered 2013-04-05: 10 mg via INTRAVENOUS

## 2013-04-05 MED ORDER — ACETAMINOPHEN 325 MG PO TABS: 650.0000 mg | ORAL_TABLET | Freq: Once | ORAL | Status: DC

## 2013-04-05 MED ORDER — HEPARIN SOD (PORK) LOCK FLUSH 100 UNIT/ML IV SOLN
500.0000 [IU] | Freq: Once | INTRAVENOUS | Status: AC | PRN
  Administered 2013-04-05: 500 [IU]
  Filled 2013-04-05: qty 5

## 2013-04-05 MED ORDER — DIPHENHYDRAMINE HCL 25 MG PO CAPS: 25.0000 mg | ORAL_CAPSULE | Freq: Once | ORAL | Status: DC

## 2013-04-05 MED ORDER — DEXTROSE 5 % IV SOLN
27.0000 mg/m2 | Freq: Once | INTRAVENOUS | Status: AC
  Administered 2013-04-05: 54 mg via INTRAVENOUS
  Filled 2013-04-05: qty 27

## 2013-04-05 MED ORDER — SODIUM CHLORIDE 0.9 % IV SOLN: Freq: Once | INTRAVENOUS | Status: DC

## 2013-04-05 MED ORDER — SODIUM CHLORIDE 0.9 % IV SOLN
Freq: Once | INTRAVENOUS | Status: AC
  Administered 2013-04-05: 15:00:00 via INTRAVENOUS

## 2013-04-05 MED ORDER — SODIUM CHLORIDE 0.9 % IJ SOLN
10.0000 mL | INTRAMUSCULAR | Status: DC | PRN
  Administered 2013-04-05: 10 mL
  Filled 2013-04-05: qty 10

## 2013-04-05 MED ORDER — POTASSIUM CHLORIDE CRYS ER 20 MEQ PO TBCR: 40.0000 meq | EXTENDED_RELEASE_TABLET | Freq: Every day | ORAL | Status: DC

## 2013-04-05 MED ORDER — ONDANSETRON 8 MG/50ML IVPB (CHCC)
8.0000 mg | Freq: Once | INTRAVENOUS | Status: AC
  Administered 2013-04-05: 8 mg via INTRAVENOUS

## 2013-04-05 NOTE — Patient Instructions (Addendum)
Coumadin 5mg  daily except 7.5 mg on Mon and Thur.  Will check PT/INR on 04/13/13 and will see you in infusion area.

## 2013-04-05 NOTE — Patient Instructions (Signed)
Patient aware of next appointment; discharged home with no complaints. 

## 2013-04-05 NOTE — Progress Notes (Signed)
Pt seen during infusion area today. INR = 1.4 on 5mg  coumadin daily. PT is very hesitant to increase his dose, as he does not want another nose bleed like last one. He has agreed to 7.5mg  on Mon and Thur and 5 mg other days.  We will recheck his INR next week during his infusion. 04/13/13 at 9:00 for lab and 9:45 infusion.

## 2013-04-05 NOTE — Progress Notes (Signed)
[facility] [phone]   [phone]  OFFICE PROGRESS NOTE  [name], MD [address] [place] [address]  Principle Diagnosis:  #1 recurrent multiple myeloma IgG kappa subtype diagnosed in June of 2008  #2 history of vasculitis and thrombosis of skin lesions   Prior Therapy:  #1 status post palliative radiotherapy to the left hip under the care of Dr. Roselind Messier  #2 status post 5 cycles of systemic chemotherapy with Revlimid and low dose Decadron. Last dose given June 2009 with good response.  #3 status post autologous peripheral blood stem cell transplant at [facility] on 02/02/2008.  #4 the patient had evidence for disease recurrence in December 2010.  #5 Revlimid 25 mg by mouth daily for 21 days every 4 weeks in addition to Decadron 40 mg orally on a weekly basis. The patient is status post 28 cycles, discontinued today secondary to disease progression.  #6 Systemic chemotherapy with Velcade 1.3 mg/M2 on days 1, 4, 8 and 11 in addition to Doxil 30 mg/M2 on day 4 and Decadron 40 mg by mouth on weekly basis every 3 weeks. Status post 3 cycles, last dose was given 05/04/2012 discontinued secondary to intolerance.  #7 Salvage therapy treatment with the Pomalyst 4 mg by mouth daily for 21 days every 28 days as well as Cytoxan 50 mg by mouth every other day and dexamethasone 40 mg by mouth once weekly. Therapy beginning 09/25/2012, status post 2 cycles discontinued secondary to disease progression and intolerance   Current therapy:  1) Kyprolis (Carfilzomib) 27 mg/M2 on days 1, 2, 8, 9, 15 and 16 every 4 weeks. First dose on 02/08/2013. This would be concurrent with the dexamethasone 40 mg by mouth on a weekly basis. Status post 1 cycle as well as day 1 and 2 of cycle 2  2) Zometa 4 mg IV given every 3 months.    INTERVAL HISTORY: [name] REIERSON 52 y.o. male returns to the clinic today for followup visit. The  patient is feeling fine today with no specific complaints except for mild fatigue. He denied having any significant nausea or vomiting, no peripheral neuropathy. He denied having any weight loss or night sweats. The patient denied having any significant chest pain, shortness breath, cough or hemoptysis. He is here today to start the third cycle of his chemotherapy with Carfilzomib.  MEDICAL HISTORY: Past Medical History  Diagnosis Date  . Multiple myeloma(203.0) 09/24/2011  . Hypertension     ALLERGIES:  is allergic to red dye and other.  MEDICATIONS:  Current Outpatient Prescriptions  Medication Sig Dispense Refill  . amLODipine (NORVASC) 10 MG tablet Take 10 mg by mouth daily.      . cloNIDine (CATAPRES) 0.1 MG tablet Take 0.1 mg by mouth at bedtime.       . gabapentin (NEURONTIN) 300 MG capsule Take 300 mg by mouth 3 (three) times daily.      [name] HYDROcodone-acetaminophen (NORCO) 10-325 MG per tablet Take 1-2 tablets by mouth every 6 (six) hours as needed for pain. For pain.  40 tablet  0  . omeprazole (PRILOSEC) 40 MG capsule Take 40 mg by mouth daily as needed (for heartburn).       . warfarin (COUMADIN) 5 MG tablet Take 5-7.5 mg by mouth daily. 1.5 tabs daily except for 1 tab on Tues, Thurs and Sat.       No current facility-administered medications for this visit.  SURGICAL HISTORY:  Past Surgical History  Procedure Laterality Date  . Video bronchoscopy  12/11/2012    Procedure: VIDEO BRONCHOSCOPY;  Surgeon: Kerin Perna, MD;  Location: [facility] OR;  Service: Thoracic;  Laterality: N/A;  . Video assisted thoracoscopy  12/11/2012    Procedure: VIDEO ASSISTED THORACOSCOPY;  Surgeon: Kerin Perna, MD;  [facility] OR;  Service: Thoracic;  Laterality: Right;  . Decortication  12/11/2012    Procedure: DECORTICATION;  Surgeon: Kerin Perna, MD;  Location: [facility] OR;  Service: Thoracic;  Laterality: N/A;    REVIEW OF SYSTEMS:  A comprehensive review of systems was negative except for:  Constitutional: positive for fatigue   PHYSICAL EXAMINATION: General appearance: alert, cooperative, fatigued and no distress Head: Normocephalic, without obvious abnormality, atraumatic Neck: no adenopathy Lymph nodes: Cervical, supraclavicular, and axillary nodes normal. Resp: clear to auscultation bilaterally Cardio: regular rate and rhythm, S1, S2 normal, no murmur, click, rub or gallop GI: soft, non-tender; bowel sounds normal; no masses,  no organomegaly Extremities: extremities normal, atraumatic, no cyanosis or edema Neurologic: Alert and oriented X 3, normal strength and tone. Normal symmetric reflexes. Normal coordination and gait  ECOG PERFORMANCE STATUS: 1 - Symptomatic but completely ambulatory  Blood pressure 144/81, pulse 94, temperature 97.4 F (36.3 C), temperature source Oral, resp. rate 18, height 5\' 3"  (1.6 m), weight 218 lb 9.6 oz (99.156 kg).  LABORATORY DATA: Lab Results  Component Value Date   WBC 6.9 04/05/2013   HGB 7.4* 04/05/2013   HCT 22.7* 04/05/2013   MCV 90.1 04/05/2013   PLT 108* 04/05/2013      Chemistry      Component Value Date/Time   NA 137 03/22/2013 1130   NA 136 03/16/2013 1930   K 3.2* 03/22/2013 1130   K 3.8 03/16/2013 1930   CL 110* 03/22/2013 1130   CL 110 03/16/2013 1930   CO2 20* 03/22/2013 1130   CO2 16* 03/16/2013 1930   BUN 6.9* 03/22/2013 1130   BUN 22 03/16/2013 1930   CREATININE 1.1 03/22/2013 1130   CREATININE 1.24 03/16/2013 1930      Component Value Date/Time   CALCIUM 8.0* 03/22/2013 1130   CALCIUM 8.2* 03/16/2013 1930   ALKPHOS 33* 03/22/2013 1130   ALKPHOS 31* 03/16/2013 1930   AST 11 03/22/2013 1130   AST 11 03/16/2013 1930   ALT 8 03/22/2013 1130   ALT 15 03/16/2013 1930   BILITOT 1.22* 03/22/2013 1130   BILITOT 0.2* 03/16/2013 1930       RADIOGRAPHIC STUDIES: No results found.  ASSESSMENT: This is a very pleasant 52 years old [place] American male with recurrent multiple myeloma currently on treatment with Carfilzomib and Decadron  status post 2 cycles.   PLAN: The patient is tolerating his treatment fairly well. We'll proceed with cycle #3 today as scheduled. He would come back for followup visit in 4 weeks with repeat myeloma panel. For the chemotherapy-induced anemia, I will arrange for the patient to received 2 units of PRBCs. He was advised to call immediately if he has any concerning symptoms in the interval.  All questions were answered. The patient knows to call the clinic with any problems, questions or concerns. We can certainly see the patient much sooner if necessary.  I spent 15 minutes counseling the patient face to face. The total time spent in the appointment was 25 minutes.

## 2013-04-05 NOTE — Patient Instructions (Signed)
Continue treatment with Carfilzomib and dexamethasone as scheduled. Followup visit in 4 weeks with repeat myeloma panel.

## 2013-04-05 NOTE — Addendum Note (Signed)
Addended by: Caren Griffins on: 04/05/2013 03:55 PM   Modules accepted: Orders

## 2013-04-05 NOTE — Progress Notes (Signed)
Per Dr Donnald Garre, K is 3.0, pt needs to take x 7 days, informed infusion RN to inform patient.  SLJ

## 2013-04-06 ENCOUNTER — Telehealth: Payer: Self-pay | Admitting: *Deleted

## 2013-04-06 ENCOUNTER — Ambulatory Visit (HOSPITAL_BASED_OUTPATIENT_CLINIC_OR_DEPARTMENT_OTHER): Payer: 59

## 2013-04-06 ENCOUNTER — Other Ambulatory Visit: Payer: Self-pay | Admitting: Oncology

## 2013-04-06 ENCOUNTER — Other Ambulatory Visit: Payer: Self-pay | Admitting: *Deleted

## 2013-04-06 VITALS — BP 139/68 | HR 78 | Temp 97.8°F | Resp 18

## 2013-04-06 DIAGNOSIS — D649 Anemia, unspecified: Secondary | ICD-10-CM

## 2013-04-06 DIAGNOSIS — Z5112 Encounter for antineoplastic immunotherapy: Secondary | ICD-10-CM

## 2013-04-06 DIAGNOSIS — C9000 Multiple myeloma not having achieved remission: Secondary | ICD-10-CM

## 2013-04-06 DIAGNOSIS — C9002 Multiple myeloma in relapse: Secondary | ICD-10-CM

## 2013-04-06 MED ORDER — SODIUM CHLORIDE 0.9 % IJ SOLN
10.0000 mL | INTRAMUSCULAR | Status: DC | PRN
  Administered 2013-04-06: 10 mL
  Filled 2013-04-06: qty 10

## 2013-04-06 MED ORDER — DIPHENHYDRAMINE HCL 25 MG PO CAPS
25.0000 mg | ORAL_CAPSULE | Freq: Once | ORAL | Status: AC
  Administered 2013-04-06: 25 mg via ORAL

## 2013-04-06 MED ORDER — DEXTROSE 5 % IV SOLN
27.0000 mg/m2 | Freq: Once | INTRAVENOUS | Status: AC
  Administered 2013-04-06: 54 mg via INTRAVENOUS
  Filled 2013-04-06: qty 27

## 2013-04-06 MED ORDER — ACETAMINOPHEN 325 MG PO TABS
650.0000 mg | ORAL_TABLET | Freq: Once | ORAL | Status: AC
Start: 2013-04-06 — End: 2013-04-06
  Administered 2013-04-06: 650 mg via ORAL

## 2013-04-06 MED ORDER — SODIUM CHLORIDE 0.9 % IV SOLN
Freq: Once | INTRAVENOUS | Status: AC
  Administered 2013-04-06: 08:00:00 via INTRAVENOUS

## 2013-04-06 MED ORDER — ONDANSETRON 8 MG/50ML IVPB (CHCC)
8.0000 mg | Freq: Once | INTRAVENOUS | Status: AC
  Administered 2013-04-06: 8 mg via INTRAVENOUS

## 2013-04-06 MED ORDER — HEPARIN SOD (PORK) LOCK FLUSH 100 UNIT/ML IV SOLN
500.0000 [IU] | Freq: Once | INTRAVENOUS | Status: AC | PRN
  Administered 2013-04-06: 500 [IU]
  Filled 2013-04-06: qty 5

## 2013-04-06 MED ORDER — DEXAMETHASONE SODIUM PHOSPHATE 10 MG/ML IJ SOLN
10.0000 mg | Freq: Once | INTRAMUSCULAR | Status: AC
  Administered 2013-04-06: 10 mg via INTRAVENOUS

## 2013-04-06 NOTE — Telephone Encounter (Signed)
Rx for wheelchair "w/c with cushion, antitippers, leg rest, size 22x 18" per pt request signed and faxed to [facility].  SLJ

## 2013-04-06 NOTE — Patient Instructions (Signed)
[facility] Discharge Instructions for Patients Receiving Chemotherapy  Today you received the following chemotherapy agents Kyprolis.  To help prevent nausea and vomiting after your treatment, we encourage you to take your nausea medication as prescribed.   If you develop nausea and vomiting that is not controlled by your nausea medication, call the clinic. If it is after clinic hours your family physician or the after hours number for the clinic or go to the Emergency Department.   BELOW ARE SYMPTOMS THAT SHOULD BE REPORTED IMMEDIATELY:  *FEVER GREATER THAN 100.5 F  *CHILLS WITH OR WITHOUT FEVER  NAUSEA AND VOMITING THAT IS NOT CONTROLLED WITH YOUR NAUSEA MEDICATION  *UNUSUAL SHORTNESS OF BREATH  *UNUSUAL BRUISING OR BLEEDING  TENDERNESS IN MOUTH AND THROAT WITH OR WITHOUT PRESENCE OF ULCERS  *URINARY PROBLEMS  *BOWEL PROBLEMS  UNUSUAL RASH Items with * indicate a potential emergency and should be followed up as soon as possible.  Feel free to call the clinic you have any questions or concerns. The clinic phone number is [phone].   I have been informed and understand all the instructions given to me. I know to contact the clinic, my physician, or go to the Emergency Department if any problems should occur. I do not have any questions at this time, but understand that I may call the clinic during office hours   should I have any questions or need assistance in obtaining follow up care.    __________________________________________  _____________  __________ Signature of Patient or Authorized Representative            Date                   Time    __________________________________________ Nurse's Signature   Blood Transfusion Information WHAT IS A BLOOD TRANSFUSION? A transfusion is the replacement of blood or some of its parts. Blood is made up of multiple cells which provide different functions.  Red blood cells carry oxygen and are used for  blood loss replacement.  White blood cells fight against infection.  Platelets control bleeding.  Plasma helps clot blood.  Other blood products are available for specialized needs, such as hemophilia or other clotting disorders. BEFORE THE TRANSFUSION  Who gives blood for transfusions?   You may be able to donate blood to be used at a later date on yourself (autologous donation).  Relatives can be asked to donate blood. This is generally not any safer than if you have received blood from a stranger. The same precautions are taken to ensure safety when a relative's blood is donated.  Healthy volunteers who are fully evaluated to make sure their blood is safe. This is blood bank blood. Transfusion therapy is the safest it has ever been in the practice of medicine. Before blood is taken from a donor, a complete history is taken to make sure that person has no history of diseases nor engages in risky social behavior (examples are intravenous drug use or sexual activity with multiple partners). The donor's travel history is screened to minimize risk of transmitting infections, such as malaria. The donated blood is tested for signs of infectious diseases, such as HIV and hepatitis. The blood is then tested to be sure it is compatible with you in order to minimize the chance of a transfusion reaction. If you or a relative donates blood, this is often done in anticipation of surgery and is not appropriate for emergency situations. It takes many days to process the   donated blood. RISKS AND COMPLICATIONS Although transfusion therapy is very safe and saves many lives, the main dangers of transfusion include:   Getting an infectious disease.  Developing a transfusion reaction. This is an allergic reaction to something in the blood you were given. Every precaution is taken to prevent this. The decision to have a blood transfusion has been considered carefully by your caregiver before blood is given.  Blood is not given unless the benefits outweigh the risks. AFTER THE TRANSFUSION  Right after receiving a blood transfusion, you will usually feel much better and more energetic. This is especially true if your red blood cells have gotten low (anemic). The transfusion raises the level of the red blood cells which carry oxygen, and this usually causes an energy increase.  The nurse administering the transfusion will monitor you carefully for complications. HOME CARE INSTRUCTIONS  No special instructions are needed after a transfusion. You may find your energy is better. Speak with your caregiver about any limitations on activity for underlying diseases you may have. SEEK MEDICAL CARE IF:   Your condition is not improving after your transfusion.  You develop redness or irritation at the intravenous (IV) site. SEEK IMMEDIATE MEDICAL CARE IF:  Any of the following symptoms occur over the next 12 hours:  Shaking chills.  You have a temperature by mouth above 102 F (38.9 C), not controlled by medicine.  Chest, back, or muscle pain.  People around you feel you are not acting correctly or are confused.  Shortness of breath or difficulty breathing.  Dizziness and fainting.  You get a rash or develop hives.  You have a decrease in urine output.  Your urine turns a dark color or changes to pink, red, or brown. Any of the following symptoms occur over the next 10 days:  You have a temperature by mouth above 102 F (38.9 C), not controlled by medicine.  Shortness of breath.  Weakness after normal activity.  The white part of the eye turns yellow (jaundice).  You have a decrease in the amount of urine or are urinating less often.  Your urine turns a dark color or changes to pink, red, or brown. Document Released: 11/01/2000 Document Revised: 01/27/2012 Document Reviewed: 06/20/2008 ExitCare Patient Information 2013 [employer].  

## 2013-04-07 LAB — TYPE AND SCREEN
ABO/RH(D): O POS
Antibody Screen: NEGATIVE
Donor AG Type: NEGATIVE

## 2013-04-09 ENCOUNTER — Telehealth: Payer: Self-pay | Admitting: Internal Medicine

## 2013-04-09 NOTE — Telephone Encounter (Signed)
Talked to pt and gave him appt for 5/27 and 28th , advised pt to get appt calendar for June 2014

## 2013-04-13 ENCOUNTER — Ambulatory Visit (HOSPITAL_BASED_OUTPATIENT_CLINIC_OR_DEPARTMENT_OTHER): Payer: 59

## 2013-04-13 ENCOUNTER — Ambulatory Visit: Payer: 59 | Admitting: Pharmacist

## 2013-04-13 ENCOUNTER — Other Ambulatory Visit (HOSPITAL_BASED_OUTPATIENT_CLINIC_OR_DEPARTMENT_OTHER): Payer: 59 | Admitting: Lab

## 2013-04-13 VITALS — BP 155/78 | HR 80 | Temp 97.8°F | Resp 20

## 2013-04-13 DIAGNOSIS — C9000 Multiple myeloma not having achieved remission: Secondary | ICD-10-CM

## 2013-04-13 DIAGNOSIS — I776 Arteritis, unspecified: Secondary | ICD-10-CM

## 2013-04-13 DIAGNOSIS — Z5112 Encounter for antineoplastic immunotherapy: Secondary | ICD-10-CM

## 2013-04-13 DIAGNOSIS — C9002 Multiple myeloma in relapse: Secondary | ICD-10-CM

## 2013-04-13 LAB — PROTIME-INR
INR: 1.9 — ABNORMAL LOW (ref 2.00–3.50)
Protime: 22.8 Seconds — ABNORMAL HIGH (ref 10.6–13.4)

## 2013-04-13 LAB — CBC WITH DIFFERENTIAL/PLATELET
BASO%: 0.3 % (ref 0.0–2.0)
Basophils Absolute: 0 10*3/uL (ref 0.0–0.1)
EOS%: 0 % (ref 0.0–7.0)
Eosinophils Absolute: 0 10*3/uL (ref 0.0–0.5)
HCT: 26.7 % — ABNORMAL LOW (ref 38.4–49.9)
HGB: 9 g/dL — ABNORMAL LOW (ref 13.0–17.1)
LYMPH%: 13.2 % — ABNORMAL LOW (ref 14.0–49.0)
MCH: 29.9 pg (ref 27.2–33.4)
MCHC: 33.7 g/dL (ref 32.0–36.0)
MCV: 88.7 fL (ref 79.3–98.0)
MONO#: 0.5 10*3/uL (ref 0.1–0.9)
MONO%: 6.7 % (ref 0.0–14.0)
NEUT#: 5.8 10*3/uL (ref 1.5–6.5)
NEUT%: 79.8 % — ABNORMAL HIGH (ref 39.0–75.0)
Platelets: 65 10*3/uL — ABNORMAL LOW (ref 140–400)
RBC: 3.01 10*6/uL — ABNORMAL LOW (ref 4.20–5.82)
RDW: 16.7 % — ABNORMAL HIGH (ref 11.0–14.6)
WBC: 7.3 10*3/uL (ref 4.0–10.3)
lymph#: 1 10*3/uL (ref 0.9–3.3)

## 2013-04-13 LAB — COMPREHENSIVE METABOLIC PANEL (CC13)
ALT: 14 U/L (ref 0–55)
Albumin: 2.3 g/dL — ABNORMAL LOW (ref 3.5–5.0)
CO2: 18 mEq/L — ABNORMAL LOW (ref 22–29)
Calcium: 8.7 mg/dL (ref 8.4–10.4)
Chloride: 108 mEq/L — ABNORMAL HIGH (ref 98–107)
Potassium: 3.8 mEq/L (ref 3.5–5.1)
Sodium: 133 mEq/L — ABNORMAL LOW (ref 136–145)
Total Protein: 10.9 g/dL — ABNORMAL HIGH (ref 6.4–8.3)

## 2013-04-13 LAB — TECHNOLOGIST REVIEW

## 2013-04-13 MED ORDER — SODIUM CHLORIDE 0.9 % IV SOLN
Freq: Once | INTRAVENOUS | Status: AC
  Administered 2013-04-13: 10:00:00 via INTRAVENOUS

## 2013-04-13 MED ORDER — DEXTROSE 5 % IV SOLN
27.0000 mg/m2 | Freq: Once | INTRAVENOUS | Status: AC
  Administered 2013-04-13: 54 mg via INTRAVENOUS
  Filled 2013-04-13: qty 27

## 2013-04-13 MED ORDER — ONDANSETRON 8 MG/50ML IVPB (CHCC)
8.0000 mg | Freq: Once | INTRAVENOUS | Status: AC
  Administered 2013-04-13: 8 mg via INTRAVENOUS

## 2013-04-13 MED ORDER — SODIUM CHLORIDE 0.9 % IJ SOLN
10.0000 mL | INTRAMUSCULAR | Status: DC | PRN
  Administered 2013-04-13: 10 mL
  Filled 2013-04-13: qty 10

## 2013-04-13 MED ORDER — HEPARIN SOD (PORK) LOCK FLUSH 100 UNIT/ML IV SOLN
500.0000 [IU] | Freq: Once | INTRAVENOUS | Status: AC | PRN
  Administered 2013-04-13: 500 [IU]
  Filled 2013-04-13: qty 5

## 2013-04-13 MED ORDER — DEXAMETHASONE SODIUM PHOSPHATE 10 MG/ML IJ SOLN
10.0000 mg | Freq: Once | INTRAMUSCULAR | Status: AC
  Administered 2013-04-13: 10 mg via INTRAVENOUS

## 2013-04-13 NOTE — Progress Notes (Signed)
INR slightly below goal (1.9) but has increased since last visit when coumadin dose was increased. Pt reports no bleeding or bruising, no nose bleeds, no missed doses. Plan is to Continue Coumadin 5mg  daily except 7.5 mg on Mon and Thur.  Will check PT/INR on 04/19/13 and will see you in infusion area. Lab at 9:15am and Infusion at 9:30am.

## 2013-04-13 NOTE — Patient Instructions (Addendum)
[facility] Discharge Instructions for Patients Receiving Chemotherapy  Today you received the following chemotherapy agents Kyprolis.  To help prevent nausea and vomiting after your treatment, we encourage you to take your nausea medication as prescribed.   If you develop nausea and vomiting that is not controlled by your nausea medication, call the clinic. If it is after clinic hours your family physician or the after hours number for the clinic or go to the Emergency Department.   BELOW ARE SYMPTOMS THAT SHOULD BE REPORTED IMMEDIATELY:  *FEVER GREATER THAN 100.5 F  *CHILLS WITH OR WITHOUT FEVER  NAUSEA AND VOMITING THAT IS NOT CONTROLLED WITH YOUR NAUSEA MEDICATION  *UNUSUAL SHORTNESS OF BREATH  *UNUSUAL BRUISING OR BLEEDING  TENDERNESS IN MOUTH AND THROAT WITH OR WITHOUT PRESENCE OF ULCERS  *URINARY PROBLEMS  *BOWEL PROBLEMS  UNUSUAL RASH Items with * indicate a potential emergency and should be followed up as soon as possible.  Please let the nurse know about any problems that you may have experienced. Feel free to call the clinic you have any questions or concerns. The clinic phone number is [phone].   I have been informed and understand all the instructions given to me. I know to contact the clinic, my physician, or go to the Emergency Department if any problems should occur. I do not have any questions at this time, but understand that I may call the clinic during office hours   should I have any questions or need assistance in obtaining follow up care.    __________________________________________  _____________  __________ Signature of Patient or Authorized Representative            Date                   Time    __________________________________________ Nurse's Signature

## 2013-04-13 NOTE — Patient Instructions (Signed)
INR slightly below goal but has increased nicely  No Changes  Continue Coumadin 5mg  daily except 7.5 mg on Mon and Thur.  Will check PT/INR on 04/19/13 and will see you in infusion area.

## 2013-04-13 NOTE — Progress Notes (Signed)
Per Dr. Arbutus Ped, its OK to treat despite counts today. Platelet count 65.

## 2013-04-14 ENCOUNTER — Encounter: Payer: Self-pay | Admitting: Internal Medicine

## 2013-04-14 ENCOUNTER — Ambulatory Visit (HOSPITAL_BASED_OUTPATIENT_CLINIC_OR_DEPARTMENT_OTHER): Payer: 59

## 2013-04-14 VITALS — BP 146/85 | HR 73 | Temp 97.8°F | Resp 20

## 2013-04-14 DIAGNOSIS — C9000 Multiple myeloma not having achieved remission: Secondary | ICD-10-CM

## 2013-04-14 DIAGNOSIS — Z5112 Encounter for antineoplastic immunotherapy: Secondary | ICD-10-CM

## 2013-04-14 MED ORDER — SODIUM CHLORIDE 0.9 % IJ SOLN
10.0000 mL | INTRAMUSCULAR | Status: DC | PRN
  Administered 2013-04-14: 10 mL
  Filled 2013-04-14: qty 10

## 2013-04-14 MED ORDER — DEXTROSE 5 % IV SOLN
27.0000 mg/m2 | Freq: Once | INTRAVENOUS | Status: AC
  Administered 2013-04-14: 54 mg via INTRAVENOUS
  Filled 2013-04-14: qty 27

## 2013-04-14 MED ORDER — SODIUM CHLORIDE 0.9 % IV SOLN
Freq: Once | INTRAVENOUS | Status: AC
  Administered 2013-04-14: 09:00:00 via INTRAVENOUS

## 2013-04-14 MED ORDER — HEPARIN SOD (PORK) LOCK FLUSH 100 UNIT/ML IV SOLN
500.0000 [IU] | Freq: Once | INTRAVENOUS | Status: AC | PRN
  Administered 2013-04-14: 500 [IU]
  Filled 2013-04-14: qty 5

## 2013-04-14 MED ORDER — ONDANSETRON 8 MG/50ML IVPB (CHCC)
8.0000 mg | Freq: Once | INTRAVENOUS | Status: AC
  Administered 2013-04-14: 8 mg via INTRAVENOUS

## 2013-04-14 MED ORDER — DEXAMETHASONE SODIUM PHOSPHATE 10 MG/ML IJ SOLN
10.0000 mg | Freq: Once | INTRAMUSCULAR | Status: AC
  Administered 2013-04-14: 10 mg via INTRAVENOUS

## 2013-04-14 MED ORDER — SODIUM CHLORIDE 0.9 % IV SOLN: Freq: Once | INTRAVENOUS | Status: DC

## 2013-04-14 NOTE — Progress Notes (Signed)
Put wife's fmla form on nurse's desk. °

## 2013-04-14 NOTE — Patient Instructions (Addendum)
[facility] Discharge Instructions for Patients Receiving Chemotherapy  Today you received the following chemotherapy agents Kyprolis.  To help prevent nausea and vomiting after your treatment, we encourage you to take your nausea medication as prescribed.   If you develop nausea and vomiting that is not controlled by your nausea medication, call the clinic. If it is after clinic hours your family physician or the after hours number for the clinic or go to the Emergency Department.   BELOW ARE SYMPTOMS THAT SHOULD BE REPORTED IMMEDIATELY:  *FEVER GREATER THAN 100.5 F  *CHILLS WITH OR WITHOUT FEVER  NAUSEA AND VOMITING THAT IS NOT CONTROLLED WITH YOUR NAUSEA MEDICATION  *UNUSUAL SHORTNESS OF BREATH  *UNUSUAL BRUISING OR BLEEDING  TENDERNESS IN MOUTH AND THROAT WITH OR WITHOUT PRESENCE OF ULCERS  *URINARY PROBLEMS  *BOWEL PROBLEMS  UNUSUAL RASH Items with * indicate a potential emergency and should be followed up as soon as possible.  Please let the nurse know about any problems that you may have experienced. Feel free to call the clinic you have any questions or concerns. The clinic phone number is [phone].   I have been informed and understand all the instructions given to me. I know to contact the clinic, my physician, or go to the Emergency Department if any problems should occur. I do not have any questions at this time, but understand that I may call the clinic during office hours   should I have any questions or need assistance in obtaining follow up care.    __________________________________________  _____________  __________ Signature of Patient or Authorized Representative            Date                   Time    __________________________________________ Nurse's Signature    

## 2013-04-19 ENCOUNTER — Other Ambulatory Visit: Payer: Self-pay | Admitting: Physician Assistant

## 2013-04-19 ENCOUNTER — Ambulatory Visit (HOSPITAL_BASED_OUTPATIENT_CLINIC_OR_DEPARTMENT_OTHER): Payer: 59 | Admitting: Pharmacist

## 2013-04-19 ENCOUNTER — Other Ambulatory Visit (HOSPITAL_BASED_OUTPATIENT_CLINIC_OR_DEPARTMENT_OTHER): Payer: 59 | Admitting: Lab

## 2013-04-19 ENCOUNTER — Encounter: Payer: Self-pay | Admitting: Physician Assistant

## 2013-04-19 ENCOUNTER — Ambulatory Visit (HOSPITAL_BASED_OUTPATIENT_CLINIC_OR_DEPARTMENT_OTHER): Payer: 59

## 2013-04-19 ENCOUNTER — Ambulatory Visit (HOSPITAL_COMMUNITY)
Admission: RE | Admit: 2013-04-19 | Discharge: 2013-04-19 | Disposition: A | Discharge status: Home / Self Care | Payer: 59 | Source: Ambulatory Visit | Attending: Cardiothoracic Surgery | Admitting: Cardiothoracic Surgery

## 2013-04-19 VITALS — BP 108/66 | HR 86 | Temp 98.5°F

## 2013-04-19 DIAGNOSIS — R079 Chest pain, unspecified: Secondary | ICD-10-CM

## 2013-04-19 DIAGNOSIS — R0600 Dyspnea, unspecified: Secondary | ICD-10-CM

## 2013-04-19 DIAGNOSIS — R059 Cough, unspecified: Secondary | ICD-10-CM | POA: Insufficient documentation

## 2013-04-19 DIAGNOSIS — J869 Pyothorax without fistula: Secondary | ICD-10-CM

## 2013-04-19 DIAGNOSIS — C9000 Multiple myeloma not having achieved remission: Secondary | ICD-10-CM

## 2013-04-19 DIAGNOSIS — Z86718 Personal history of other venous thrombosis and embolism: Secondary | ICD-10-CM

## 2013-04-19 DIAGNOSIS — Z7901 Long term (current) use of anticoagulants: Secondary | ICD-10-CM

## 2013-04-19 DIAGNOSIS — Z5181 Encounter for therapeutic drug level monitoring: Secondary | ICD-10-CM

## 2013-04-19 DIAGNOSIS — I776 Arteritis, unspecified: Secondary | ICD-10-CM

## 2013-04-19 DIAGNOSIS — C9002 Multiple myeloma in relapse: Secondary | ICD-10-CM

## 2013-04-19 DIAGNOSIS — J90 Pleural effusion, not elsewhere classified: Secondary | ICD-10-CM | POA: Insufficient documentation

## 2013-04-19 DIAGNOSIS — R05 Cough: Secondary | ICD-10-CM | POA: Insufficient documentation

## 2013-04-19 DIAGNOSIS — I10 Essential (primary) hypertension: Secondary | ICD-10-CM | POA: Insufficient documentation

## 2013-04-19 LAB — CBC WITH DIFFERENTIAL/PLATELET
Basophils Absolute: 0 10*3/uL (ref 0.0–0.1)
EOS%: 0 % (ref 0.0–7.0)
HCT: 23.9 % — ABNORMAL LOW (ref 38.4–49.9)
HGB: 7.8 g/dL — ABNORMAL LOW (ref 13.0–17.1)
MCH: 29.3 pg (ref 27.2–33.4)
MCV: 89.8 fL (ref 79.3–98.0)
MONO%: 10.3 % (ref 0.0–14.0)
NEUT%: 63.3 % (ref 39.0–75.0)
Platelets: 43 10*3/uL — ABNORMAL LOW (ref 140–400)
lymph#: 0.9 10*3/uL (ref 0.9–3.3)

## 2013-04-19 LAB — COMPREHENSIVE METABOLIC PANEL (CC13)
Albumin: 2.2 g/dL — ABNORMAL LOW (ref 3.5–5.0)
Alkaline Phosphatase: 25 U/L — ABNORMAL LOW (ref 40–150)
BUN: 17.6 mg/dL (ref 7.0–26.0)
Glucose: 111 mg/dl — ABNORMAL HIGH (ref 70–99)
Potassium: 3.6 mEq/L (ref 3.5–5.1)

## 2013-04-19 LAB — POCT INR: INR: 1.5

## 2013-04-19 MED ORDER — MORPHINE SULFATE 4 MG/ML IJ SOLN
2.0000 mg | Freq: Once | INTRAMUSCULAR | Status: AC
  Administered 2013-04-19: 2 mg via INTRAVENOUS

## 2013-04-19 MED ORDER — SODIUM CHLORIDE 0.9 % IV SOLN
Freq: Once | INTRAVENOUS | Status: AC
  Administered 2013-04-19: 12:00:00 via INTRAVENOUS

## 2013-04-19 NOTE — Progress Notes (Signed)
PT seen in infusion area today. INR=1.5 on 5 mg daily with 7.5 mg on Mon and Thur PLTC=43K and pt wife states he has not been eating He has not taken his coumadin today.  Plan to continue 5mg  daily with 7.5 mg on Mon and Thur. He has no other changes to report. He is verbal about keeping his INR in low range as he does not want a nose bleed like he had recently. We will see him back in a week on Mon, June 9 9:15 lab and 9:30 CC.

## 2013-04-19 NOTE — Patient Instructions (Addendum)
Dehydration, Adult Dehydration is when you lose more fluids from the body than you take in. Vital organs like the kidneys, brain, and heart cannot function without a proper amount of fluids and salt. Any loss of fluids from the body can cause dehydration.  CAUSES   Vomiting.  Diarrhea.  Excessive sweating.  Excessive urine output.  Fever. SYMPTOMS  Mild dehydration  Thirst.  Dry lips.  Slightly dry mouth. Moderate dehydration  Very dry mouth.  Sunken eyes.  Skin does not bounce back quickly when lightly pinched and released.  Dark urine and decreased urine production.  Decreased tear production.  Headache. Severe dehydration  Very dry mouth.  Extreme thirst.  Rapid, weak pulse (more than 100 beats per minute at rest).  Cold hands and feet.  Not able to sweat in spite of heat and temperature.  Rapid breathing.  Blue lips.  Confusion and lethargy.  Difficulty being awakened.  Minimal urine production.  No tears. DIAGNOSIS  Your caregiver will diagnose dehydration based on your symptoms and your exam. Blood and urine tests will help confirm the diagnosis. The diagnostic evaluation should also identify the cause of dehydration. TREATMENT  Treatment of mild or moderate dehydration can often be done at home by increasing the amount of fluids that you drink. It is best to drink small amounts of fluid more often. Drinking too much at one time can make vomiting worse. Refer to the home care instructions below. Severe dehydration needs to be treated at the hospital where you will probably be given intravenous (IV) fluids that contain water and electrolytes. HOME CARE INSTRUCTIONS   Ask your caregiver about specific rehydration instructions.  Drink enough fluids to keep your urine clear or pale yellow.  Drink small amounts frequently if you have nausea and vomiting.  Eat as you normally do.  Avoid:  Foods or drinks high in sugar.  Carbonated  drinks.  Juice.  Extremely hot or cold fluids.  Drinks with caffeine.  Fatty, greasy foods.  Alcohol.  Tobacco.  Overeating.  Gelatin desserts.  Wash your hands well to avoid spreading bacteria and viruses.  Only take over-the-counter or prescription medicines for pain, discomfort, or fever as directed by your caregiver.  Ask your caregiver if you should continue all prescribed and over-the-counter medicines.  Keep all follow-up appointments with your caregiver. SEEK MEDICAL CARE IF:  You have abdominal pain and it increases or stays in one area (localizes).  You have a rash, stiff neck, or severe headache.  You are irritable, sleepy, or difficult to awaken.  You are weak, dizzy, or extremely thirsty. SEEK IMMEDIATE MEDICAL CARE IF:   You are unable to keep fluids down or you get worse despite treatment.  You have frequent episodes of vomiting or diarrhea.  You have blood or green matter (bile) in your vomit.  You have blood in your stool or your stool looks black and tarry.  You have not urinated in 6 to 8 hours, or you have only urinated a small amount of very dark urine.  You have a fever.  You faint. MAKE SURE YOU:   Understand these instructions.  Will watch your condition.  Will get help right away if you are not doing well or get worse. Document Released: 11/04/2005 Document Revised: 01/27/2012 Document Reviewed: 06/24/2011 [facility] Patient Information 2014 [place], [place].  Pain Medicine Instructions You have been given a prescription for pain medicines. These medicines may affect your ability to think clearly. They may also affect your ability  to perform physical activities. Take these medicines only as needed for pain. You do not need to take them if you are not having pain, unless directed by your caregiver. You can take less than the prescribed dose if you find a smaller amount of medicine controls the pain. It may not be possible to make all of  your pain go away, but you should be comfortable enough to move, breathe, and take care of yourself. After you start taking pain medicines, while taking the medicines, and for 8 hours after stopping the medicines:  Do not drive.  Do not operate machinery.  Do not operate power tools.  Do not sign legal documents.  Do not supervise children by yourself.  Do not participate in activities that require climbing or being in high places.  Do not enter a body of water (lake, river, ocean, spa, swimming pool) without an adult nearby who can help you. You may have been prescribed a pain medicine that contains acetaminophen (paracetamol). If so, take only the amount directed by your caregiver. Do not take any other acetaminophen while taking this medicine. An overdose of acetaminophen can result in severe liver damage. If you are taking other medicines, check the active ingredients for acetaminophen. Acetaminophen is found in hundreds of over-the-counter and prescription medicines. These include cold relief products, menstrual cramp relief medicines, fever-reducing medicines, acid indigestion relief products, and pain relief products. HOME CARE INSTRUCTIONS   Do not drink alcohol, take sleeping pills, or take other medicines until at least 8 hours after your last dose of pain medicine, or as directed by your caregiver.  Use a bulk stool softener if you become constipated from your pain medicines. Increasing your intake of fruits and vegetables will also help.  Write down the times when you take your medicines. Look at the times before taking your next dose of medicine. It is easy to become confused while on pain medicines. Recording the times helps you to avoid an overdose. SEEK MEDICAL CARE IF:  Your medicine is not helping the pain go away.  You vomit or have diarrhea shortly after taking the medicine.  You develop new pain in areas that did not hurt before. SEEK IMMEDIATE MEDICAL CARE  IF:  You feel dizzy or faint.  You feel there are other problems that might be caused by your medicine. MAKE SURE YOU:   Understand these instructions.  Will watch your condition.  Will get help right away if you are not doing well or get worse. Document Released: 02/10/2001 Document Revised: 01/27/2012 Document Reviewed: 10/19/2010 [facility] Patient Information 2014 [place], [place].  *Do not drive as you received 2mg  Morphine

## 2013-04-19 NOTE — Patient Instructions (Addendum)
Take coumadin 10mg  today then continue Coumadin 5mg  daily except 7.5 mg on Mon and Thur.  Will check PT/INR on 04/26/13 with lab at 9:15am and 9:30 coumadin clinic

## 2013-04-19 NOTE — Progress Notes (Signed)
Patient complains of right sided chest pain that has been on going and awoke him from sleep last night. Patient complains of a non productive cough without fever. Patient states the pain is a 5 on a 0 to 10 pain scale. Stating the pain is achy and burning. Patient states the last time he took his pain medication was last night. Patient instructed to take pain medication as prescribed. Patient verbalized understanding. [name], PA notified. Hold treatment today for platelets 43 per [name]. PA.  Order given and carried out for 2 mg Morphine IV push and 1 liter normal saline over 2 hours per [name], PA

## 2013-04-19 NOTE — Progress Notes (Signed)
Patient complains of right sided chest pain and has had a nonproductive cough. Not feeling quite well today. Platelet count is 43,000 with a hemoglobin of 7.8. Denies shortness of breath. Patient discussed with Dr. Darrold Span. Will hold Kyprolis treatment today. He is also not been eating very well. Chest x-ray obtained and was negative for consolidation or evidence of pneumonia. There is a small right pleural effusion as  has been previously seen. Patient is given a liter of normal saline as well as 2 mg of IV morphine for pain management. He'll return tomorrow as scheduled and will be discussed with Dr. Arbutus Ped regarding moving forward with his scheduledKyprolis infusion.  Laural Benes, Min Tunnell E, PA-C

## 2013-04-20 ENCOUNTER — Other Ambulatory Visit: Payer: Self-pay | Admitting: *Deleted

## 2013-04-20 ENCOUNTER — Encounter: Payer: Self-pay | Admitting: *Deleted

## 2013-04-20 ENCOUNTER — Ambulatory Visit: Payer: 59

## 2013-04-20 DIAGNOSIS — C9000 Multiple myeloma not having achieved remission: Secondary | ICD-10-CM

## 2013-04-20 MED ORDER — OXYCODONE HCL 5 MG PO TABS: 5.0000 mg | ORAL_TABLET | ORAL | Status: DC

## 2013-04-20 NOTE — Progress Notes (Signed)
Patient is requesting a wheelchair.  Due to his diagnosis of multiple myeloma and peripheral neuropathy secondary to chemotherapy, he is unable to ambulate more than 30 feet without experiencing pain and inability to walk further without risk of falling.  A walker or cane would not be sufficient to help resolve his increased pain and neuropathy while ambulating.  SLJ

## 2013-04-20 NOTE — Progress Notes (Signed)
Patient in today for possible treatment with Kyprolis, C3D16. C3 day 15 held yesterday due to patient having multiple complaints of pain and low lab values. Treatment C3D16 to be held today as well per Dr Arbutus Ped. Patient informed he would be called with new appointments for next week. Patient/wife question if he will start C4 next week since he had no treatment this week. Informed patient Dr Arbutus Ped would review current labs and put in new orders and appts are to be determined. Discussed with  desk nurse, Kathlee Nations, RN.

## 2013-04-21 ENCOUNTER — Encounter: Payer: Self-pay | Admitting: Internal Medicine

## 2013-04-21 NOTE — Progress Notes (Signed)
Faxed wife's fmla form to [employer] @ [phone]. °

## 2013-04-23 ENCOUNTER — Other Ambulatory Visit: Payer: Self-pay | Admitting: Medical Oncology

## 2013-04-25 ENCOUNTER — Other Ambulatory Visit: Payer: Self-pay | Admitting: Internal Medicine

## 2013-04-26 ENCOUNTER — Telehealth: Payer: Self-pay | Admitting: *Deleted

## 2013-04-26 ENCOUNTER — Ambulatory Visit (HOSPITAL_BASED_OUTPATIENT_CLINIC_OR_DEPARTMENT_OTHER): Payer: 59

## 2013-04-26 ENCOUNTER — Ambulatory Visit: Payer: 59 | Admitting: Pharmacist

## 2013-04-26 ENCOUNTER — Telehealth: Payer: Self-pay | Admitting: Internal Medicine

## 2013-04-26 ENCOUNTER — Other Ambulatory Visit: Payer: Self-pay | Admitting: Internal Medicine

## 2013-04-26 DIAGNOSIS — I776 Arteritis, unspecified: Secondary | ICD-10-CM

## 2013-04-26 DIAGNOSIS — C9000 Multiple myeloma not having achieved remission: Secondary | ICD-10-CM

## 2013-04-26 LAB — CBC WITH DIFFERENTIAL/PLATELET
Basophils Absolute: 0 10*3/uL (ref 0.0–0.1)
Eosinophils Absolute: 0.1 10*3/uL (ref 0.0–0.5)
HCT: 25.3 % — ABNORMAL LOW (ref 38.4–49.9)
HGB: 8.3 g/dL — ABNORMAL LOW (ref 13.0–17.1)
MONO#: 0.4 10*3/uL (ref 0.1–0.9)
NEUT#: 2.6 10*3/uL (ref 1.5–6.5)
NEUT%: 64.9 % (ref 39.0–75.0)
RDW: 17.6 % — ABNORMAL HIGH (ref 11.0–14.6)
lymph#: 1 10*3/uL (ref 0.9–3.3)

## 2013-04-26 LAB — COMPREHENSIVE METABOLIC PANEL (CC13)
ALT: 10 U/L (ref 0–55)
AST: 7 U/L (ref 5–34)
Alkaline Phosphatase: 35 U/L — ABNORMAL LOW (ref 40–150)
BUN: 9.2 mg/dL (ref 7.0–26.0)
Creatinine: 0.8 mg/dL (ref 0.7–1.3)
Potassium: 3.5 mEq/L (ref 3.5–5.1)

## 2013-04-26 LAB — PROTIME-INR

## 2013-04-26 LAB — POCT INR: INR: 3.2

## 2013-04-26 LAB — HOLD TUBE, BLOOD BANK

## 2013-04-26 NOTE — Telephone Encounter (Signed)
gv and printed appt sched and avs for pt  °

## 2013-04-26 NOTE — Progress Notes (Signed)
INR = 3.2 on 5 mg/day; 7.5 mg on Mon/Thurs Pltc = 98; Hgb = 8.3 No epistaxis. Pt on weekly Decadron.   INR a little above goal today.  His INR was below goal last visit on same dose.  Pt is asymptomatic for bleeding.  We will keep the dose of Coumadin the same. Pt returns next Monday 6/16 for lab/MD & infusion.  We will see him in infusion that day. Pt aware if he should develop epistaxis to call us & we can check his INR again or have him hold/decrease his dose empirically. Ebony Hail, Pharm.D., CPP 04/26/2013@10 :35 AM

## 2013-04-26 NOTE — Progress Notes (Signed)
erroneous

## 2013-04-26 NOTE — Telephone Encounter (Signed)
pt came in today b.c he was advised by AJ last that his tx would be r/s  to 6.9.14...s/w Dr. Arbutus Ped due to tx not being scheduled and he said to skip this week and to come nxt week...pt ok and aware

## 2013-04-26 NOTE — Telephone Encounter (Signed)
Per staff message and POF I have scheduled appts.  [name]  

## 2013-04-27 LAB — IGG, IGA, IGM
IgA: 8 mg/dL — ABNORMAL LOW (ref 68–379)
IgM, Serum: 10 mg/dL — ABNORMAL LOW (ref 41–251)

## 2013-04-27 LAB — KAPPA/LAMBDA LIGHT CHAINS
Kappa free light chain: 217 mg/dL — ABNORMAL HIGH (ref 0.33–1.94)
Kappa:Lambda Ratio: 367.8 — ABNORMAL HIGH (ref 0.26–1.65)
Lambda Free Lght Chn: 0.59 mg/dL (ref 0.57–2.63)

## 2013-04-30 ENCOUNTER — Other Ambulatory Visit: Payer: Self-pay | Admitting: Physician Assistant

## 2013-04-30 ENCOUNTER — Other Ambulatory Visit: Payer: 59

## 2013-04-30 DIAGNOSIS — C9002 Multiple myeloma in relapse: Secondary | ICD-10-CM

## 2013-04-30 NOTE — Telephone Encounter (Signed)
Pt needs refill for neurontin and I sentt request to [facility] for refill. I called in refill to pharmacy.

## 2013-05-03 ENCOUNTER — Other Ambulatory Visit: Payer: Self-pay | Admitting: Medical Oncology

## 2013-05-03 ENCOUNTER — Telehealth: Payer: Self-pay | Admitting: *Deleted

## 2013-05-03 ENCOUNTER — Telehealth: Payer: Self-pay | Admitting: Internal Medicine

## 2013-05-03 ENCOUNTER — Ambulatory Visit (HOSPITAL_BASED_OUTPATIENT_CLINIC_OR_DEPARTMENT_OTHER): Payer: 59 | Admitting: Pharmacist

## 2013-05-03 ENCOUNTER — Telehealth: Payer: Self-pay | Admitting: Medical Oncology

## 2013-05-03 ENCOUNTER — Encounter: Payer: Self-pay | Admitting: Internal Medicine

## 2013-05-03 ENCOUNTER — Ambulatory Visit (HOSPITAL_BASED_OUTPATIENT_CLINIC_OR_DEPARTMENT_OTHER): Payer: 59

## 2013-05-03 ENCOUNTER — Ambulatory Visit (HOSPITAL_BASED_OUTPATIENT_CLINIC_OR_DEPARTMENT_OTHER): Payer: 59 | Admitting: Internal Medicine

## 2013-05-03 ENCOUNTER — Encounter (HOSPITAL_COMMUNITY)
Admission: RE | Admit: 2013-05-03 | Discharge: 2013-05-03 | Disposition: A | Discharge status: Home / Self Care | Payer: 59 | Source: Ambulatory Visit | Attending: Internal Medicine | Admitting: Internal Medicine

## 2013-05-03 ENCOUNTER — Other Ambulatory Visit (HOSPITAL_BASED_OUTPATIENT_CLINIC_OR_DEPARTMENT_OTHER): Payer: 59 | Admitting: Lab

## 2013-05-03 ENCOUNTER — Ambulatory Visit: Payer: 59 | Admitting: Lab

## 2013-05-03 ENCOUNTER — Other Ambulatory Visit: Payer: 59 | Admitting: Lab

## 2013-05-03 VITALS — BP 140/60 | HR 112 | Temp 101.0°F | Resp 20 | Ht 63.0 in | Wt 219.2 lb

## 2013-05-03 VITALS — BP 108/61 | HR 99 | Temp 102.2°F

## 2013-05-03 DIAGNOSIS — D649 Anemia, unspecified: Secondary | ICD-10-CM

## 2013-05-03 DIAGNOSIS — D6481 Anemia due to antineoplastic chemotherapy: Secondary | ICD-10-CM

## 2013-05-03 DIAGNOSIS — I776 Arteritis, unspecified: Secondary | ICD-10-CM

## 2013-05-03 DIAGNOSIS — C9002 Multiple myeloma in relapse: Secondary | ICD-10-CM

## 2013-05-03 DIAGNOSIS — T451X5A Adverse effect of antineoplastic and immunosuppressive drugs, initial encounter: Secondary | ICD-10-CM | POA: Insufficient documentation

## 2013-05-03 DIAGNOSIS — G8918 Other acute postprocedural pain: Secondary | ICD-10-CM

## 2013-05-03 DIAGNOSIS — C9000 Multiple myeloma not having achieved remission: Secondary | ICD-10-CM

## 2013-05-03 DIAGNOSIS — R509 Fever, unspecified: Secondary | ICD-10-CM

## 2013-05-03 DIAGNOSIS — G629 Polyneuropathy, unspecified: Secondary | ICD-10-CM

## 2013-05-03 DIAGNOSIS — J209 Acute bronchitis, unspecified: Secondary | ICD-10-CM

## 2013-05-03 LAB — COMPREHENSIVE METABOLIC PANEL (CC13)
ALT: 33 U/L (ref 0–55)
AST: 24 U/L (ref 5–34)
Albumin: 2.2 g/dL — ABNORMAL LOW (ref 3.5–5.0)
Alkaline Phosphatase: 37 U/L — ABNORMAL LOW (ref 40–150)
BUN: 11.4 mg/dL (ref 7.0–26.0)
Chloride: 106 mEq/L (ref 98–107)
Potassium: 3.5 mEq/L (ref 3.5–5.1)

## 2013-05-03 LAB — CBC WITH DIFFERENTIAL/PLATELET
BASO%: 1.8 % (ref 0.0–2.0)
EOS%: 0.4 % (ref 0.0–7.0)
HCT: 21 % — ABNORMAL LOW (ref 38.4–49.9)
MCH: 29.6 pg (ref 27.2–33.4)
MCHC: 31.9 g/dL — ABNORMAL LOW (ref 32.0–36.0)
MONO#: 0.4 10*3/uL (ref 0.1–0.9)
NEUT%: 54.4 % (ref 39.0–75.0)
RBC: 2.26 10*6/uL — ABNORMAL LOW (ref 4.20–5.82)
RDW: 20.2 % — ABNORMAL HIGH (ref 11.0–14.6)
WBC: 2.8 10*3/uL — ABNORMAL LOW (ref 4.0–10.3)
lymph#: 0.8 10*3/uL — ABNORMAL LOW (ref 0.9–3.3)
nRBC: 8 % — ABNORMAL HIGH (ref 0–0)

## 2013-05-03 LAB — PROTIME-INR
INR: 1.6 — ABNORMAL LOW (ref 2.00–3.50)
Protime: 19.2 Seconds — ABNORMAL HIGH (ref 10.6–13.4)

## 2013-05-03 LAB — TECHNOLOGIST REVIEW: Technologist Review: 2

## 2013-05-03 MED ORDER — MOXIFLOXACIN HCL 400 MG PO TABS: 400.0000 mg | ORAL_TABLET | Freq: Every day | ORAL | Status: DC

## 2013-05-03 MED ORDER — HYDROCODONE-ACETAMINOPHEN 10-325 MG PO TABS: 1.0000 | ORAL_TABLET | Freq: Four times a day (QID) | ORAL | Status: DC | PRN

## 2013-05-03 MED ORDER — ACETAMINOPHEN 325 MG PO TABS: 650.0000 mg | ORAL_TABLET | Freq: Once | ORAL | Status: DC

## 2013-05-03 MED ORDER — SODIUM CHLORIDE 0.9 % IJ SOLN
10.0000 mL | INTRAMUSCULAR | Status: AC | PRN
  Administered 2013-05-03: 10 mL
  Filled 2013-05-03: qty 10

## 2013-05-03 MED ORDER — SODIUM CHLORIDE 0.9 % IV SOLN
Freq: Once | INTRAVENOUS | Status: AC
  Administered 2013-05-03: 12:00:00 via INTRAVENOUS

## 2013-05-03 MED ORDER — ACETAMINOPHEN 325 MG PO TABS
650.0000 mg | ORAL_TABLET | Freq: Once | ORAL | Status: AC
  Administered 2013-05-03: 650 mg via ORAL

## 2013-05-03 MED ORDER — DIPHENHYDRAMINE HCL 50 MG/ML IJ SOLN: 25.0000 mg | Freq: Once | INTRAMUSCULAR | Status: DC

## 2013-05-03 MED ORDER — HEPARIN SOD (PORK) LOCK FLUSH 100 UNIT/ML IV SOLN
500.0000 [IU] | Freq: Every day | INTRAVENOUS | Status: AC | PRN
  Administered 2013-05-03: 500 [IU]
  Filled 2013-05-03: qty 5

## 2013-05-03 MED ORDER — SODIUM CHLORIDE 0.45 % IV SOLN: INTRAVENOUS | Status: DC

## 2013-05-03 NOTE — Progress Notes (Signed)
Patient given Tylenol 650mg  at discharge for fever, per Dr. Arbutus Ped; advised wife to check temp often this evening and continue to give antibiotic and Tylenol as needed for fever; verbalized understanding.

## 2013-05-03 NOTE — Telephone Encounter (Signed)
Per staff message and POF I have scheduled appts.  [name]  

## 2013-05-03 NOTE — Patient Instructions (Signed)
PRBCs transfusion today. Avelox for acute bronchitis. Change chemotherapy to Carfilzomib, Cytoxan and Decadron starting next week. Followup visit in 2 weeks

## 2013-05-03 NOTE — Progress Notes (Signed)
har done 

## 2013-05-03 NOTE — Telephone Encounter (Signed)
I left a message for [name] to pick up rx for avelox and give dose to pt today while he is infusion.

## 2013-05-03 NOTE — Patient Instructions (Signed)
Patient aware of next appointment.

## 2013-05-03 NOTE — Patient Instructions (Addendum)
Sorry you are not feeling well today! Hopefully the infusions will help!  INR below goal today.  Take 2 tablets today and then continue same dose of 5 mg daily except for 7.5 on Mondays and thursdays  We will see you next Monday in the infusion area (lab at 9am)  Get well soon!

## 2013-05-03 NOTE — Telephone Encounter (Signed)
I called in refill for norco the patient does not like to take oxycodone.

## 2013-05-03 NOTE — Progress Notes (Signed)
HAR done

## 2013-05-03 NOTE — Telephone Encounter (Signed)
Called in avelox to pharmacy and pts wife notified.

## 2013-05-03 NOTE — Progress Notes (Signed)
Patient is not feeling well today. Chemotherapy is being held. Patient has fever of 101 and ANC of 1.5 as well as Hgb of 6.7. He will receive antibiotics and blood infusion. His INR is below goal today as well but we will not make any major changes to his regimen. No issues with bleeding or bruising noted (no nose bleeds), no missed doses). Instructed to take 2 tablets today (10 mg) and then continue same dose of 5 mg daily except for 7.5 mg on Mondays and Thursdays. We will see patient in the infusion area next Monday 05/10/13 (lab at 9am and infusiona t 9:30am)

## 2013-05-03 NOTE — Progress Notes (Signed)
[facility] Telephone:([phone]) [phone]   Fax:([phone]  OFFICE PROGRESS NOTE  [name], MD [address] [place] [address]  Principle Diagnosis:  #1 recurrent multiple myeloma IgG kappa subtype diagnosed in June of 2008  #2 history of vasculitis and thrombosis of skin lesions   Prior Therapy:  #1 status post palliative radiotherapy to the left hip under the care of Dr. Roselind Messier  #2 status post 5 cycles of systemic chemotherapy with Revlimid and low dose Decadron. Last dose given June 2009 with good response.  #3 status post autologous peripheral blood stem cell transplant at [facility] on 02/02/2008.  #4 the patient had evidence for disease recurrence in December 2010.  #5 Revlimid 25 mg by mouth daily for 21 days every 4 weeks in addition to Decadron 40 mg orally on a weekly basis. The patient is status post 28 cycles, discontinued today secondary to disease progression.  #6 Systemic chemotherapy with Velcade 1.3 mg/M2 on days 1, 4, 8 and 11 in addition to Doxil 30 mg/M2 on day 4 and Decadron 40 mg by mouth on weekly basis every 3 weeks. Status post 3 cycles, last dose was given 05/04/2012 discontinued secondary to intolerance.  #7 Salvage therapy treatment with the Pomalyst 4 mg by mouth daily for 21 days every 28 days as well as Cytoxan 50 mg by mouth every other day and dexamethasone 40 mg by mouth once weekly. Therapy beginning 09/25/2012, status post 2 cycles discontinued secondary to disease progression and intolerance   Current therapy:  1) Kyprolis (Carfilzomib) 27 mg/M2 on days 1, 2, 8, 9, 15 and 16 every 4 weeks. First dose on 02/08/2013. This would be concurrent with the dexamethasone 40 mg by mouth on a weekly basis. Status post 2 cycles.  2) Zometa 4 mg IV given every 3 months.    INTERVAL HISTORY: [name] 52 y.o. male returns to the clinic today for followup visit accompanied his wife. The patient is  complaining of increasing fatigue and weakness as well as chest congestion. He was at the beach recently with his [place] and everyone was sick. He completed 2 cycles of systemic chemotherapy with Carfilzomib and Decadron. He had repeat myeloma panel which showed some improvement in his disease. Beta-2 microglobulin was down to 4.71 from 9.61 3 months ago. Free kappa light chain was 217 down from 748, 3 months ago. IgG was 5840, IgA 8 and IgM 10. The patient is here today for evaluation and discussion of his treatment options. He denied having any significant fever or chills, no chest pain, shortness breath, cough or hemoptysis. He has no nausea or vomiting.  MEDICAL HISTORY: Past Medical History  Diagnosis Date  . Multiple myeloma 09/24/2011  . Hypertension     ALLERGIES:  is allergic to red dye and other.  MEDICATIONS:  Current Outpatient Prescriptions  Medication Sig Dispense Refill  . amLODipine (NORVASC) 10 MG tablet Take 10 mg by mouth daily.      . cloNIDine (CATAPRES) 0.1 MG tablet Take 0.1 mg by mouth at bedtime.       [name] dexamethasone (DECADRON) 4 MG tablet Take 40 mg by mouth once a week. Takes 40 mg Q Monday      . gabapentin (NEURONTIN) 300 MG capsule Take 300 mg by mouth 3 (three) times daily.      [name] gabapentin (NEURONTIN) 300 MG capsule TAKE 1 CAPSULE (300 MG TOTAL) BY MOUTH 3 (THREE) TIMES  DAILY.  90 capsule  2  . HYDROcodone-acetaminophen (NORCO) 10-325 MG per tablet Take 1-2 tablets by mouth every 6 (six) hours as needed for pain. For pain.  40 tablet  0  . omeprazole (PRILOSEC) 40 MG capsule Take 40 mg by mouth daily as needed (for heartburn).       [name] oxyCODONE (OXY IR/ROXICODONE) 5 MG immediate release tablet Take 1 tablet (5 mg total) by mouth as directed. Take 5mg  every 4-6 hours as needed for pain  40 tablet  0  . potassium chloride SA (K-DUR,KLOR-CON) 20 MEQ tablet Take 2 tablets (40 mEq total) by mouth daily. daily x 7 days  14 tablet  0  . warfarin (COUMADIN) 5 MG  tablet Take 5-7.5 mg by mouth daily. 1.5 tabs daily except for 1 tab on Tues, Thurs and Sat.      . warfarin (COUMADIN) 5 MG tablet TAKE 7.5MG (1 AND 1/2) DAILY EXCEPT 5MG (1 TAB) ON TUES, THURS, AND SAT, OR AS DIRECTED  40 tablet  2  . benzonatate (TESSALON) 200 MG capsule Take 1 capsule by mouth as needed.      [name] losartan (COZAAR) 100 MG tablet Take 1 tablet by mouth daily.       No current facility-administered medications for this visit.    SURGICAL HISTORY:  Past Surgical History  Procedure Laterality Date  . Video bronchoscopy  12/11/2012    Procedure: VIDEO BRONCHOSCOPY;  Surgeon: Kerin Perna, MD;  Location: [facility] OR;  Service: Thoracic;  Laterality: N/A;  . Video assisted thoracoscopy  12/11/2012    Procedure: VIDEO ASSISTED THORACOSCOPY;  Surgeon: Kerin Perna, MD;  Location: [facility] OR;  Service: Thoracic;  Laterality: Right;  . Decortication  12/11/2012    Procedure: DECORTICATION;  Surgeon: Kerin Perna, MD;  Location: [facility] OR;  Service: Thoracic;  Laterality: N/A;    REVIEW OF SYSTEMS:  A comprehensive review of systems was negative except for: Constitutional: positive for anorexia and fatigue Ears, nose, mouth, throat, and face: positive for sore throat Respiratory: positive for cough and sputum   PHYSICAL EXAMINATION: General appearance: alert, cooperative, fatigued and no distress Head: Normocephalic, without obvious abnormality, atraumatic Neck: no adenopathy Lymph nodes: Cervical, supraclavicular, and axillary nodes normal. Resp: clear to auscultation bilaterally Cardio: regular rate and rhythm, S1, S2 normal, no murmur, click, rub or gallop GI: soft, non-tender; bowel sounds normal; no masses,  no organomegaly Extremities: extremities normal, atraumatic, no cyanosis or edema Neurologic: Alert and oriented X 3, normal strength and tone. Normal symmetric reflexes. Normal coordination and gait  ECOG PERFORMANCE STATUS: 1 - Symptomatic but completely ambulatory  Blood  pressure 140/60, pulse 112, temperature 101 F (38.3 C), temperature source Oral, resp. rate 20, height 5\' 3"  (1.6 m), weight 219 lb 3.2 oz (99.428 kg).  LABORATORY DATA: Lab Results  Component Value Date   WBC 2.8* 05/03/2013   HGB 6.7* 05/03/2013   HCT 21.0* 05/03/2013   MCV 92.9 05/03/2013   PLT 101* 05/03/2013      Chemistry      Component Value Date/Time   NA 138 04/26/2013 0910   NA 136 03/16/2013 1930   K 3.5 04/26/2013 0910   K 3.8 03/16/2013 1930   CL 109* 04/26/2013 0910   CL 110 03/16/2013 1930   CO2 21* 04/26/2013 0910   CO2 16* 03/16/2013 1930   BUN 9.2 04/26/2013 0910   BUN 22 03/16/2013 1930   CREATININE 0.8 04/26/2013 0910   CREATININE 1.24 03/16/2013 1930  Component Value Date/Time   CALCIUM 9.2 04/26/2013 0910   CALCIUM 8.2* 03/16/2013 1930   ALKPHOS 35* 04/26/2013 0910   ALKPHOS 31* 03/16/2013 1930   AST 7 04/26/2013 0910   AST 11 03/16/2013 1930   ALT 10 04/26/2013 0910   ALT 15 03/16/2013 1930   BILITOT 0.79 04/26/2013 0910   BILITOT 0.2* 03/16/2013 1930       RADIOGRAPHIC STUDIES: Dg Chest 2 View  04/19/2013   *RADIOLOGY REPORT*  Clinical Data: right empyema  CHEST - 2 VIEW  Comparison: Chest radiograph 02/05/2013  Findings: Right power port in place with tip in distal SVC.  There is trace right pleural effusions again demonstrated.  No consolidation.  No pneumothorax.  IMPRESSION: Trace residual right pleural effusion.   Original Report Authenticated By: Genevive Bi, M.D.    ASSESSMENT AND PLAN: This is a very pleasant 52 years old African American male with recurrent multiple myeloma currently on treatment with Carfilzomib and Decadron status post 2 cycles with some improvement in his disease. He also has significant anemia and recent bronchitis. 1) multiple myeloma: I recommended for the patient to continue his treatment with Carfilzomib and Decadron but I would add Cytoxan to his treatment regimen. He will start the first cycle of this treatment next week. I discussed  with the patient adverse effect of this treatment including but not limited to alopecia, myelosuppression, nausea and vomiting, peripheral neuropathy, liver or in dysfunction. He would like to proceed with treatment as planned. 2) chemotherapy-induced anemia: I will arrange for the patient received 2 units of PRBCs today. 3) sore throat and acute bronchitis: I gave the patient prescription for Avelox 400 mg by mouth daily for 7 days. 4) the patient would come back for followup visit in 2 weeks for evaluation and management any adverse effect of his chemotherapy.  All questions were answered. The patient knows to call the clinic with any problems, questions or concerns. We can certainly see the patient much sooner if necessary.

## 2013-05-03 NOTE — Progress Notes (Signed)
onc tx plan sent for blood transfusion this week.

## 2013-05-04 ENCOUNTER — Ambulatory Visit (HOSPITAL_BASED_OUTPATIENT_CLINIC_OR_DEPARTMENT_OTHER): Payer: 59

## 2013-05-04 VITALS — BP 105/65 | HR 76 | Temp 98.2°F | Resp 18

## 2013-05-04 DIAGNOSIS — D649 Anemia, unspecified: Secondary | ICD-10-CM

## 2013-05-04 MED ORDER — ACETAMINOPHEN 325 MG PO TABS
650.0000 mg | ORAL_TABLET | Freq: Once | ORAL | Status: AC
  Administered 2013-05-04: 650 mg via ORAL

## 2013-05-04 MED ORDER — SODIUM CHLORIDE 0.9 % IJ SOLN
10.0000 mL | INTRAMUSCULAR | Status: AC | PRN
  Administered 2013-05-04: 10 mL
  Filled 2013-05-04: qty 10

## 2013-05-04 MED ORDER — HEPARIN SOD (PORK) LOCK FLUSH 100 UNIT/ML IV SOLN
500.0000 [IU] | Freq: Every day | INTRAVENOUS | Status: AC | PRN
  Administered 2013-05-04: 500 [IU]
  Filled 2013-05-04: qty 5

## 2013-05-04 MED ORDER — DIPHENHYDRAMINE HCL 50 MG/ML IJ SOLN
25.0000 mg | Freq: Once | INTRAMUSCULAR | Status: AC
  Administered 2013-05-04: 25 mg via INTRAVENOUS

## 2013-05-04 MED ORDER — SODIUM CHLORIDE 0.9 % IV SOLN
250.0000 mL | Freq: Once | INTRAVENOUS | Status: AC
Start: 2013-05-05 — End: 2013-05-04
  Administered 2013-05-04: 250 mL via INTRAVENOUS

## 2013-05-04 NOTE — Progress Notes (Signed)
Pt temp 100.9 -Dr Arbutus Ped notified . Will continue to moniter pt . Pt on avelox day 2.

## 2013-05-04 NOTE — Patient Instructions (Addendum)
Blood Transfusion  A blood transfusion replaces your blood or some of its parts. Blood is replaced when you have lost blood because of surgery, an accident, or for severe blood conditions like anemia. You can donate blood to be used on yourself if you have a planned surgery. If you lose blood during that surgery, your own blood can be given back to you. Any blood given to you is checked to make sure it matches your blood type. Your temperature, blood pressure, and heart rate (vital signs) will be checked often.  GET HELP RIGHT AWAY IF:   You feel sick to your stomach (nauseous) or throw up (vomit).  You have watery poop (diarrhea).  You have shortness of breath or trouble breathing.  You have blood in your pee (urine) or have dark colored pee.  You have chest pain or tightness.  Your eyes or skin turn yellow (jaundice).  You have a temperature by mouth above 102 F (38.9 C), not controlled by medicine.  You start to shake and have chills.  You develop a a red rash (hives) or feel itchy.  You develop lightheadedness or feel confused.  You develop back, joint, or muscle pain.  You do not feel hungry (lost appetite).  You feel tired, restless, or nervous.  You develop belly (abdominal) cramps. Document Released: 01/31/2009 Document Revised: 01/27/2012 Document Reviewed: 01/31/2009 [employer] Patient Information 2014 [employer].  

## 2013-05-05 ENCOUNTER — Ambulatory Visit (HOSPITAL_BASED_OUTPATIENT_CLINIC_OR_DEPARTMENT_OTHER): Payer: 59

## 2013-05-05 VITALS — BP 92/53 | HR 70 | Temp 97.9°F | Resp 18

## 2013-05-05 DIAGNOSIS — D6481 Anemia due to antineoplastic chemotherapy: Secondary | ICD-10-CM

## 2013-05-05 DIAGNOSIS — D649 Anemia, unspecified: Secondary | ICD-10-CM

## 2013-05-05 DIAGNOSIS — C9000 Multiple myeloma not having achieved remission: Secondary | ICD-10-CM

## 2013-05-05 DIAGNOSIS — T451X5A Adverse effect of antineoplastic and immunosuppressive drugs, initial encounter: Secondary | ICD-10-CM

## 2013-05-05 MED ORDER — DIPHENHYDRAMINE HCL 50 MG/ML IJ SOLN
25.0000 mg | Freq: Once | INTRAMUSCULAR | Status: AC
  Administered 2013-05-05: 25 mg via INTRAVENOUS

## 2013-05-05 MED ORDER — SODIUM CHLORIDE 0.9 % IJ SOLN
10.0000 mL | INTRAMUSCULAR | Status: AC | PRN
  Administered 2013-05-05: 10 mL
  Filled 2013-05-05: qty 10

## 2013-05-05 MED ORDER — ACETAMINOPHEN 325 MG PO TABS
650.0000 mg | ORAL_TABLET | Freq: Once | ORAL | Status: AC
  Administered 2013-05-05: 650 mg via ORAL

## 2013-05-05 MED ORDER — SODIUM CHLORIDE 0.9 % IV SOLN
250.0000 mL | Freq: Once | INTRAVENOUS | Status: AC
  Administered 2013-05-05: 250 mL via INTRAVENOUS

## 2013-05-05 MED ORDER — HEPARIN SOD (PORK) LOCK FLUSH 100 UNIT/ML IV SOLN
500.0000 [IU] | Freq: Every day | INTRAVENOUS | Status: AC | PRN
  Administered 2013-05-05: 500 [IU]
  Filled 2013-05-05: qty 5

## 2013-05-05 NOTE — Patient Instructions (Signed)
Blood Transfusion Information WHAT IS A BLOOD TRANSFUSION? A transfusion is the replacement of blood or some of its parts. Blood is made up of multiple cells which provide different functions.  Red blood cells carry oxygen and are used for blood loss replacement.  White blood cells fight against infection.  Platelets control bleeding.  Plasma helps clot blood.  Other blood products are available for specialized needs, such as hemophilia or other clotting disorders. BEFORE THE TRANSFUSION  Who gives blood for transfusions?   You may be able to donate blood to be used at a later date on yourself (autologous donation).  Relatives can be asked to donate blood. This is generally not any safer than if you have received blood from a stranger. The same precautions are taken to ensure safety when a relative's blood is donated.  Healthy volunteers who are fully evaluated to make sure their blood is safe. This is blood bank blood. Transfusion therapy is the safest it has ever been in the practice of medicine. Before blood is taken from a donor, a complete history is taken to make sure that person has no history of diseases nor engages in risky social behavior (examples are intravenous drug use or sexual activity with multiple partners). The donor's travel history is screened to minimize risk of transmitting infections, such as malaria. The donated blood is tested for signs of infectious diseases, such as HIV and hepatitis. The blood is then tested to be sure it is compatible with you in order to minimize the chance of a transfusion reaction. If you or a relative donates blood, this is often done in anticipation of surgery and is not appropriate for emergency situations. It takes many days to process the donated blood. RISKS AND COMPLICATIONS Although transfusion therapy is very safe and saves many lives, the main dangers of transfusion include:   Getting an infectious disease.  Developing a  transfusion reaction. This is an allergic reaction to something in the blood you were given. Every precaution is taken to prevent this. The decision to have a blood transfusion has been considered carefully by your caregiver before blood is given. Blood is not given unless the benefits outweigh the risks. AFTER THE TRANSFUSION  Right after receiving a blood transfusion, you will usually feel much better and more energetic. This is especially true if your red blood cells have gotten low (anemic). The transfusion raises the level of the red blood cells which carry oxygen, and this usually causes an energy increase.  The nurse administering the transfusion will monitor you carefully for complications. HOME CARE INSTRUCTIONS  No special instructions are needed after a transfusion. You may find your energy is better. Speak with your caregiver about any limitations on activity for underlying diseases you may have. SEEK MEDICAL CARE IF:   Your condition is not improving after your transfusion.  You develop redness or irritation at the intravenous (IV) site. SEEK IMMEDIATE MEDICAL CARE IF:  Any of the following symptoms occur over the next 12 hours:  Shaking chills.  You have a temperature by mouth above 102 F (38.9 C), not controlled by medicine.  Chest, back, or muscle pain.  People around you feel you are not acting correctly or are confused.  Shortness of breath or difficulty breathing.  Dizziness and fainting.  You get a rash or develop hives.  You have a decrease in urine output.  Your urine turns a dark color or changes to pink, red, or brown. Any of the following   symptoms occur over the next 10 days:  You have a temperature by mouth above 102 F (38.9 C), not controlled by medicine.  Shortness of breath.  Weakness after normal activity.  The white part of the eye turns yellow (jaundice).  You have a decrease in the amount of urine or are urinating less often.  Your  urine turns a dark color or changes to pink, red, or brown. Document Released: 11/01/2000 Document Revised: 01/27/2012 Document Reviewed: 06/20/2008 Exit[employer] Patient Information 2014 [employer].  

## 2013-05-06 LAB — TYPE AND SCREEN
Donor AG Type: NEGATIVE
Unit division: 0

## 2013-05-10 ENCOUNTER — Other Ambulatory Visit (HOSPITAL_BASED_OUTPATIENT_CLINIC_OR_DEPARTMENT_OTHER): Payer: 59 | Admitting: Lab

## 2013-05-10 ENCOUNTER — Ambulatory Visit (HOSPITAL_BASED_OUTPATIENT_CLINIC_OR_DEPARTMENT_OTHER): Payer: 59 | Admitting: Pharmacist

## 2013-05-10 ENCOUNTER — Ambulatory Visit (HOSPITAL_BASED_OUTPATIENT_CLINIC_OR_DEPARTMENT_OTHER): Payer: 59

## 2013-05-10 ENCOUNTER — Other Ambulatory Visit: Payer: Self-pay | Admitting: Medical Oncology

## 2013-05-10 VITALS — BP 117/68 | HR 77 | Temp 98.0°F | Resp 18

## 2013-05-10 DIAGNOSIS — I776 Arteritis, unspecified: Secondary | ICD-10-CM

## 2013-05-10 DIAGNOSIS — Z5112 Encounter for antineoplastic immunotherapy: Secondary | ICD-10-CM

## 2013-05-10 DIAGNOSIS — C9000 Multiple myeloma not having achieved remission: Secondary | ICD-10-CM

## 2013-05-10 DIAGNOSIS — Z5111 Encounter for antineoplastic chemotherapy: Secondary | ICD-10-CM

## 2013-05-10 DIAGNOSIS — C9002 Multiple myeloma in relapse: Secondary | ICD-10-CM

## 2013-05-10 DIAGNOSIS — E876 Hypokalemia: Secondary | ICD-10-CM

## 2013-05-10 LAB — CBC WITH DIFFERENTIAL/PLATELET
BASO%: 0.4 % (ref 0.0–2.0)
EOS%: 1.1 % (ref 0.0–7.0)
Eosinophils Absolute: 0.1 10*3/uL (ref 0.0–0.5)
HGB: 9.2 g/dL — ABNORMAL LOW (ref 13.0–17.1)
LYMPH%: 26.2 % (ref 14.0–49.0)
MONO%: 11.6 % (ref 0.0–14.0)
NEUT#: 2.8 10*3/uL (ref 1.5–6.5)
NEUT%: 60.7 % (ref 39.0–75.0)
RDW: 17.7 % — ABNORMAL HIGH (ref 11.0–14.6)
WBC: 4.7 10*3/uL (ref 4.0–10.3)
lymph#: 1.2 10*3/uL (ref 0.9–3.3)
nRBC: 4 % — ABNORMAL HIGH (ref 0–0)

## 2013-05-10 LAB — COMPREHENSIVE METABOLIC PANEL (CC13)
ALT: 11 U/L (ref 0–55)
CO2: 17 mEq/L — ABNORMAL LOW (ref 22–29)
Calcium: 8.8 mg/dL (ref 8.4–10.4)
Chloride: 112 mEq/L — ABNORMAL HIGH (ref 98–107)
Creatinine: 0.8 mg/dL (ref 0.7–1.3)
Glucose: 94 mg/dl (ref 70–99)

## 2013-05-10 LAB — PROTIME-INR

## 2013-05-10 LAB — TECHNOLOGIST REVIEW

## 2013-05-10 LAB — POCT INR: INR: 2.3

## 2013-05-10 MED ORDER — SODIUM CHLORIDE 0.9 % IV SOLN
300.0000 mg/m2 | Freq: Once | INTRAVENOUS | Status: AC
  Administered 2013-05-10: 640 mg via INTRAVENOUS
  Filled 2013-05-10: qty 32

## 2013-05-10 MED ORDER — SODIUM CHLORIDE 0.9 % IJ SOLN
10.0000 mL | INTRAMUSCULAR | Status: DC | PRN
  Administered 2013-05-10: 10 mL
  Filled 2013-05-10: qty 10

## 2013-05-10 MED ORDER — DEXTROSE 5 % IV SOLN
27.0000 mg/m2 | Freq: Once | INTRAVENOUS | Status: AC
  Administered 2013-05-10: 56 mg via INTRAVENOUS
  Filled 2013-05-10: qty 28

## 2013-05-10 MED ORDER — POTASSIUM CHLORIDE CRYS ER 20 MEQ PO TBCR: 40.0000 meq | EXTENDED_RELEASE_TABLET | Freq: Every day | ORAL | Status: DC

## 2013-05-10 MED ORDER — HEPARIN SOD (PORK) LOCK FLUSH 100 UNIT/ML IV SOLN
500.0000 [IU] | Freq: Once | INTRAVENOUS | Status: AC | PRN
  Administered 2013-05-10: 500 [IU]
  Filled 2013-05-10: qty 5

## 2013-05-10 MED ORDER — SODIUM CHLORIDE 0.9 % IV SOLN
Freq: Once | INTRAVENOUS | Status: AC
  Administered 2013-05-10: 10:00:00 via INTRAVENOUS

## 2013-05-10 MED ORDER — ONDANSETRON 8 MG/50ML IVPB (CHCC)
8.0000 mg | Freq: Once | INTRAVENOUS | Status: AC
  Administered 2013-05-10: 8 mg via INTRAVENOUS

## 2013-05-10 MED ORDER — SODIUM CHLORIDE 0.9 % IV SOLN: Freq: Once | INTRAVENOUS | Status: DC

## 2013-05-10 MED ORDER — DEXAMETHASONE SODIUM PHOSPHATE 10 MG/ML IJ SOLN
10.0000 mg | Freq: Once | INTRAMUSCULAR | Status: AC
  Administered 2013-05-10: 10 mg via INTRAVENOUS

## 2013-05-10 NOTE — Patient Instructions (Signed)
Continue Coumadin 5mg  daily except 7.5 mg on Mon and Thu.  Will recheck PT/INR on 05/17/13; lab at 8:45am, infusion at 9:30am, Adrena at 9:45am, and coumadin clinic at 10am.   A pharmacist will see you in infusion.

## 2013-05-10 NOTE — Patient Instructions (Addendum)
[facility] Discharge Instructions for Patients Receiving Chemotherapy  Today you received the following chemotherapy agents Kyprolis.  To help prevent nausea and vomiting after your treatment, we encourage you to take your nausea medication as prescribed.   If you develop nausea and vomiting that is not controlled by your nausea medication, call the clinic. If it is after clinic hours your family physician or the after hours number for the clinic or go to the Emergency Department.   BELOW ARE SYMPTOMS THAT SHOULD BE REPORTED IMMEDIATELY:  *FEVER GREATER THAN 100.5 F  *CHILLS WITH OR WITHOUT FEVER  NAUSEA AND VOMITING THAT IS NOT CONTROLLED WITH YOUR NAUSEA MEDICATION  *UNUSUAL SHORTNESS OF BREATH  *UNUSUAL BRUISING OR BLEEDING  TENDERNESS IN MOUTH AND THROAT WITH OR WITHOUT PRESENCE OF ULCERS  *URINARY PROBLEMS  *BOWEL PROBLEMS  UNUSUAL RASH Items with * indicate a potential emergency and should be followed up as soon as possible.  Please let the nurse know about any problems that you may have experienced. Feel free to call the clinic you have any questions or concerns. The clinic phone number is [phone].   I have been informed and understand all the instructions given to me. I know to contact the clinic, my physician, or go to the Emergency Department if any problems should occur. I do not have any questions at this time, but understand that I may call the clinic during office hours   should I have any questions or need assistance in obtaining follow up care.    __________________________________________  _____________  __________ Signature of Patient or Authorized Representative            Date                   Time    __________________________________________ Nurse's Signature   Cyclophosphamide injection What is this medicine? CYCLOPHOSPHAMIDE (sye kloe FOSS fa mide) is a chemotherapy drug. It slows the growth of cancer cells. This medicine  is used to treat many types of cancer like lymphoma, myeloma, leukemia, breast cancer, and ovarian cancer, to name a few. It is also used to treat nephrotic syndrome in children. This medicine may be used for other purposes; ask your health care provider or pharmacist if you have questions. What should I tell my health care provider before I take this medicine? They need to know if you have any of these conditions: -blood disorders -history of other chemotherapy -history of radiation therapy -infection -kidney disease -liver disease -tumors in the bone marrow -an unusual or allergic reaction to cyclophosphamide, other chemotherapy, other medicines, foods, dyes, or preservatives -pregnant or trying to get pregnant -breast-feeding How should I use this medicine? This drug is usually given as an injection into a vein or muscle or by infusion into a vein. It is administered in a hospital or clinic by a specially trained health care professional. Talk to your pediatrician regarding the use of this medicine in children. While this drug may be prescribed for selected conditions, precautions do apply. Overdosage: If you think you have taken too much of this medicine contact a poison control center or emergency room at once. NOTE: This medicine is only for you. Do not share this medicine with others. What if I miss a dose? It is important not to miss your dose. Call your doctor or health care professional if you are unable to keep an appointment. What may interact with this medicine? Do not take this medicine with any  of the following medications: -mibefradil -nalidixic acid This medicine may also interact with the following medications: -doxorubicin -etanercept -medicines to increase blood counts like filgrastim, pegfilgrastim, sargramostim -medicines that block muscle or nerve pain -St. John's Wort -phenobarbital -succinylcholine chloride -trastuzumab -vaccines Talk to your doctor or  health care professional before taking any of these medicines: -acetaminophen -aspirin -ibuprofen -ketoprofen -naproxen This list may not describe all possible interactions. Give your health care provider a list of all the medicines, herbs, non-prescription drugs, or dietary supplements you use. Also tell them if you smoke, drink alcohol, or use illegal drugs. Some items may interact with your medicine. What should I watch for while using this medicine? Visit your doctor for checks on your progress. This drug may make you feel generally unwell. This is not uncommon, as chemotherapy can affect healthy cells as well as cancer cells. Report any side effects. Continue your course of treatment even though you feel ill unless your doctor tells you to stop. Drink water or other fluids as directed. Urinate often, even at night. In some cases, you may be given additional medicines to help with side effects. Follow all directions for their use. Call your doctor or health care professional for advice if you get a fever, chills or sore throat, or other symptoms of a cold or flu. Do not treat yourself. This drug decreases your body's ability to fight infections. Try to avoid being around people who are sick. This medicine may increase your risk to bruise or bleed. Call your doctor or health care professional if you notice any unusual bleeding. Be careful brushing and flossing your teeth or using a toothpick because you may get an infection or bleed more easily. If you have any dental work done, tell your dentist you are receiving this medicine. Avoid taking products that contain aspirin, acetaminophen, ibuprofen, naproxen, or ketoprofen unless instructed by your doctor. These medicines may hide a fever. Do not become pregnant while taking this medicine. Women should inform their doctor if they wish to become pregnant or think they might be pregnant. There is a potential for serious side effects to an unborn child.  Talk to your health care professional or pharmacist for more information. Do not breast-feed an infant while taking this medicine. Men should inform their doctor if they wish to father a child. This medicine may lower sperm counts. If you are going to have surgery, tell your doctor or health care professional that you have taken this medicine. What side effects may I notice from receiving this medicine? Side effects that you should report to your doctor or health care professional as soon as possible: -allergic reactions like skin rash, itching or hives, swelling of the face, lips, or tongue -low blood counts - this medicine may decrease the number of white blood cells, red blood cells and platelets. You may be at increased risk for infections and bleeding. -signs of infection - fever or chills, cough, sore throat, pain or difficulty passing urine -signs of decreased platelets or bleeding - bruising, pinpoint red spots on the skin, black, tarry stools, blood in the urine -signs of decreased red blood cells - unusually weak or tired, fainting spells, lightheadedness -breathing problems -dark urine -mouth sores -pain, swelling, redness at site where injected -swelling of the ankles, feet, hands -trouble passing urine or change in the amount of urine -weight gain -yellowing of the eyes or skin Side effects that usually do not require medical attention (report to your doctor or health care  professional if they continue or are bothersome): -changes in nail or skin color -diarrhea -hair loss -loss of appetite -missed menstrual periods -nausea, vomiting -stomach pain This list may not describe all possible side effects. Call your doctor for medical advice about side effects. You may report side effects to FDA at 1-800-FDA-1088. Where should I keep my medicine? This drug is given in a hospital or clinic and will not be stored at home. NOTE: This sheet is a summary. It may not cover all possible  information. If you have questions about this medicine, talk to your doctor, pharmacist, or health care provider.  2012, [employer]. (02/09/2008 2:32:25 PM)

## 2013-05-10 NOTE — Telephone Encounter (Signed)
I instructed pt to take kdur 40 meq daily for 7 days. He said he has enough tablets.

## 2013-05-10 NOTE — Progress Notes (Signed)
Pt seen in infusion room. INR within goal today. Pt has been on Avelox 400mg  x 7 days. He took his last dose on 05/09/13. INR may be slightly elevated today secondary to interaction with Avelox. No problems to report regarding anticoagulation. Pt is starting Cytoxan IV today with his Kyprolis. Cytoxan can increase/decrease the effects of Coumadin. Will monitor. No other medication changes. Pt did not take Coumadin 10mg  x 1 on 05/03/13 as previously discussed.  Pt continued 5mg  daily except 7.5mg  on Mon and Thu. No changes in diet. No missed doses of coumadin. Will continue Coumadin 5mg  daily except 7.5 mg on Mon and Thu.  Will recheck PT/INR on 05/17/13; lab at 8:45am, infusion at 9:30am, Adrena at 9:45am, and coumadin clinic at 10am.   Will evaluate INR to ensure INR stays therapeutic despite completing Avelox.

## 2013-05-11 ENCOUNTER — Ambulatory Visit (HOSPITAL_BASED_OUTPATIENT_CLINIC_OR_DEPARTMENT_OTHER): Payer: 59

## 2013-05-11 VITALS — BP 131/66 | HR 67 | Temp 98.0°F | Resp 18

## 2013-05-11 DIAGNOSIS — Z5112 Encounter for antineoplastic immunotherapy: Secondary | ICD-10-CM

## 2013-05-11 DIAGNOSIS — C9000 Multiple myeloma not having achieved remission: Secondary | ICD-10-CM

## 2013-05-11 MED ORDER — DEXAMETHASONE SODIUM PHOSPHATE 10 MG/ML IJ SOLN
10.0000 mg | Freq: Once | INTRAMUSCULAR | Status: AC
  Administered 2013-05-11: 10 mg via INTRAVENOUS

## 2013-05-11 MED ORDER — ONDANSETRON 8 MG/50ML IVPB (CHCC)
8.0000 mg | Freq: Once | INTRAVENOUS | Status: AC
  Administered 2013-05-11: 8 mg via INTRAVENOUS

## 2013-05-11 MED ORDER — CARFILZOMIB CHEMO INJECTION 60 MG
27.0000 mg/m2 | Freq: Once | INTRAVENOUS | Status: AC
  Administered 2013-05-11: 56 mg via INTRAVENOUS
  Filled 2013-05-11: qty 28

## 2013-05-11 NOTE — Patient Instructions (Addendum)
[facility] Discharge Instructions for Patients Receiving Chemotherapy  Today you received the following chemotherapy agents Kyprolis.  To help prevent nausea and vomiting after your treatment, we encourage you to take your nausea medication.   If you develop nausea and vomiting that is not controlled by your nausea medication, call the clinic.   BELOW ARE SYMPTOMS THAT SHOULD BE REPORTED IMMEDIATELY:  *FEVER GREATER THAN 100.5 F  *CHILLS WITH OR WITHOUT FEVER  NAUSEA AND VOMITING THAT IS NOT CONTROLLED WITH YOUR NAUSEA MEDICATION  *UNUSUAL SHORTNESS OF BREATH  *UNUSUAL BRUISING OR BLEEDING  TENDERNESS IN MOUTH AND THROAT WITH OR WITHOUT PRESENCE OF ULCERS  *URINARY PROBLEMS  *BOWEL PROBLEMS  UNUSUAL RASH Items with * indicate a potential emergency and should be followed up as soon as possible.  Feel free to call the clinic you have any questions or concerns. The clinic phone number is [phone].    

## 2013-05-17 ENCOUNTER — Telehealth: Payer: Self-pay | Admitting: *Deleted

## 2013-05-17 ENCOUNTER — Other Ambulatory Visit (HOSPITAL_BASED_OUTPATIENT_CLINIC_OR_DEPARTMENT_OTHER): Payer: 59 | Admitting: Lab

## 2013-05-17 ENCOUNTER — Encounter: Payer: Self-pay | Admitting: Physician Assistant

## 2013-05-17 ENCOUNTER — Other Ambulatory Visit: Payer: 59 | Admitting: Lab

## 2013-05-17 ENCOUNTER — Ambulatory Visit: Payer: 59 | Admitting: Pharmacist

## 2013-05-17 ENCOUNTER — Ambulatory Visit (HOSPITAL_BASED_OUTPATIENT_CLINIC_OR_DEPARTMENT_OTHER): Payer: 59 | Admitting: Physician Assistant

## 2013-05-17 ENCOUNTER — Ambulatory Visit (HOSPITAL_BASED_OUTPATIENT_CLINIC_OR_DEPARTMENT_OTHER): Payer: 59

## 2013-05-17 VITALS — BP 155/75 | HR 79 | Temp 98.8°F | Resp 18

## 2013-05-17 DIAGNOSIS — Z5112 Encounter for antineoplastic immunotherapy: Secondary | ICD-10-CM

## 2013-05-17 DIAGNOSIS — C9002 Multiple myeloma in relapse: Secondary | ICD-10-CM

## 2013-05-17 DIAGNOSIS — C9000 Multiple myeloma not having achieved remission: Secondary | ICD-10-CM

## 2013-05-17 DIAGNOSIS — I776 Arteritis, unspecified: Secondary | ICD-10-CM

## 2013-05-17 LAB — PROTIME-INR

## 2013-05-17 LAB — CBC WITH DIFFERENTIAL/PLATELET
BASO%: 0.3 % (ref 0.0–2.0)
Basophils Absolute: 0 10*3/uL (ref 0.0–0.1)
HCT: 26.1 % — ABNORMAL LOW (ref 38.4–49.9)
HGB: 8.5 g/dL — ABNORMAL LOW (ref 13.0–17.1)
MCHC: 32.6 g/dL (ref 32.0–36.0)
MONO#: 0.5 10*3/uL (ref 0.1–0.9)
NEUT#: 5.5 10*3/uL (ref 1.5–6.5)
NEUT%: 79 % — ABNORMAL HIGH (ref 39.0–75.0)
WBC: 6.9 10*3/uL (ref 4.0–10.3)
lymph#: 0.9 10*3/uL (ref 0.9–3.3)

## 2013-05-17 LAB — COMPREHENSIVE METABOLIC PANEL (CC13)
AST: 7 U/L (ref 5–34)
Albumin: 2.5 g/dL — ABNORMAL LOW (ref 3.5–5.0)
BUN: 12.2 mg/dL (ref 7.0–26.0)
Calcium: 8.8 mg/dL (ref 8.4–10.4)
Chloride: 108 mEq/L (ref 98–109)
Potassium: 3.2 mEq/L — ABNORMAL LOW (ref 3.5–5.1)

## 2013-05-17 MED ORDER — HEPARIN SOD (PORK) LOCK FLUSH 100 UNIT/ML IV SOLN
500.0000 [IU] | Freq: Once | INTRAVENOUS | Status: AC | PRN
  Administered 2013-05-17: 500 [IU]
  Filled 2013-05-17: qty 5

## 2013-05-17 MED ORDER — DEXAMETHASONE 4 MG PO TABS: 40.0000 mg | ORAL_TABLET | ORAL | Status: DC

## 2013-05-17 MED ORDER — ONDANSETRON 8 MG/50ML IVPB (CHCC)
8.0000 mg | Freq: Once | INTRAVENOUS | Status: AC
  Administered 2013-05-17: 8 mg via INTRAVENOUS

## 2013-05-17 MED ORDER — SODIUM CHLORIDE 0.9 % IV SOLN
Freq: Once | INTRAVENOUS | Status: AC
  Administered 2013-05-17: 09:00:00 via INTRAVENOUS

## 2013-05-17 MED ORDER — SODIUM CHLORIDE 0.9 % IJ SOLN
10.0000 mL | INTRAMUSCULAR | Status: DC | PRN
  Administered 2013-05-17: 10 mL
  Filled 2013-05-17: qty 10

## 2013-05-17 MED ORDER — DEXAMETHASONE SODIUM PHOSPHATE 10 MG/ML IJ SOLN
10.0000 mg | Freq: Once | INTRAMUSCULAR | Status: AC
  Administered 2013-05-17: 10 mg via INTRAVENOUS

## 2013-05-17 MED ORDER — DEXTROSE 5 % IV SOLN
27.0000 mg/m2 | Freq: Once | INTRAVENOUS | Status: AC
  Administered 2013-05-17: 56 mg via INTRAVENOUS
  Filled 2013-05-17: qty 28

## 2013-05-17 MED ORDER — PREGABALIN 50 MG PO CAPS: 50.0000 mg | ORAL_CAPSULE | Freq: Three times a day (TID) | ORAL | Status: DC

## 2013-05-17 MED ORDER — SODIUM CHLORIDE 0.9 % IV SOLN
300.0000 mg/m2 | Freq: Once | INTRAVENOUS | Status: AC
  Administered 2013-05-17: 640 mg via INTRAVENOUS
  Filled 2013-05-17: qty 32

## 2013-05-17 NOTE — Patient Instructions (Addendum)
Follow up with Dr. Arbutus Ped in 1 week

## 2013-05-17 NOTE — Patient Instructions (Signed)
Continue Coumadin 5mg  daily.  Will recheck PT/INR on 05/24/13; lab at 8:45am, Dr. Arbutus Ped at 9:45am, treatment at 10am and coumadin clinic at 10:15am.   A pharmacist will see you in infusion.

## 2013-05-17 NOTE — Telephone Encounter (Signed)
Pt called stating that he has not received a w/c because the one that they delivered did not have a "charcoal grey" frame.  He is requesting the one that is charcoal grey.  Denny Levy at [facility] [phone] and she states they only have the standard metal chairs.  She stated that they will call him to further discuss this.  SLJ

## 2013-05-17 NOTE — Progress Notes (Signed)
INR within goal today. Pt doing well. No problems to report. Pt has been taking coumadin 5mg  daily for the past week (not 5mg  daily except 7.5mg  on Mon & Thu). Medication list has been updated. Pt is stopping Neurontin and starting Lyrica. Although no specific interactions are listed between coumadin and Lyrica, clinically we have patients that have had INR's increase and required coumadin dose reductions when starting Lyrica. Will monitor. Continue Coumadin 5mg  daily.  Will recheck PT/INR on 05/24/13; lab at 8:45am, Dr. Arbutus Ped at 9:45am, treatment at 10am and coumadin clinic at 10:15am.   A pharmacist will see you in infusion.

## 2013-05-17 NOTE — Patient Instructions (Signed)
[facility] Discharge Instructions for Patients Receiving Chemotherapy  Today you received the following chemotherapy agents Cytoxan/kyprolis To help prevent nausea and vomiting after your treatment, we encourage you to take your nausea medication as prescribed.   If you develop nausea and vomiting that is not controlled by your nausea medication, call the clinic.   BELOW ARE SYMPTOMS THAT SHOULD BE REPORTED IMMEDIATELY:  *FEVER GREATER THAN 100.5 F  *CHILLS WITH OR WITHOUT FEVER  NAUSEA AND VOMITING THAT IS NOT CONTROLLED WITH YOUR NAUSEA MEDICATION  *UNUSUAL SHORTNESS OF BREATH  *UNUSUAL BRUISING OR BLEEDING  TENDERNESS IN MOUTH AND THROAT WITH OR WITHOUT PRESENCE OF ULCERS  *URINARY PROBLEMS  *BOWEL PROBLEMS  UNUSUAL RASH Items with * indicate a potential emergency and should be followed up as soon as possible.  Feel free to call the clinic you have any questions or concerns. The clinic phone number is [phone].

## 2013-05-18 ENCOUNTER — Other Ambulatory Visit: Payer: Self-pay | Admitting: *Deleted

## 2013-05-18 ENCOUNTER — Ambulatory Visit (HOSPITAL_BASED_OUTPATIENT_CLINIC_OR_DEPARTMENT_OTHER): Payer: 59

## 2013-05-18 VITALS — BP 149/74 | HR 73 | Temp 98.6°F

## 2013-05-18 DIAGNOSIS — Z5112 Encounter for antineoplastic immunotherapy: Secondary | ICD-10-CM

## 2013-05-18 DIAGNOSIS — C9000 Multiple myeloma not having achieved remission: Secondary | ICD-10-CM

## 2013-05-18 MED ORDER — HEPARIN SOD (PORK) LOCK FLUSH 100 UNIT/ML IV SOLN
500.0000 [IU] | Freq: Once | INTRAVENOUS | Status: AC | PRN
  Administered 2013-05-18: 500 [IU]
  Filled 2013-05-18: qty 5

## 2013-05-18 MED ORDER — SODIUM CHLORIDE 0.9 % IV SOLN
Freq: Once | INTRAVENOUS | Status: AC
  Administered 2013-05-18: 08:00:00 via INTRAVENOUS

## 2013-05-18 MED ORDER — SODIUM CHLORIDE 0.9 % IJ SOLN
10.0000 mL | INTRAMUSCULAR | Status: DC | PRN
  Administered 2013-05-18: 10 mL
  Filled 2013-05-18: qty 10

## 2013-05-18 MED ORDER — ONDANSETRON 8 MG/50ML IVPB (CHCC)
8.0000 mg | Freq: Once | INTRAVENOUS | Status: AC
  Administered 2013-05-18: 8 mg via INTRAVENOUS

## 2013-05-18 MED ORDER — DEXTROSE 5 % IV SOLN
27.0000 mg/m2 | Freq: Once | INTRAVENOUS | Status: AC
  Administered 2013-05-18: 56 mg via INTRAVENOUS
  Filled 2013-05-18: qty 28

## 2013-05-18 MED ORDER — PREGABALIN 50 MG PO CAPS: 50.0000 mg | ORAL_CAPSULE | Freq: Three times a day (TID) | ORAL | Status: DC

## 2013-05-18 MED ORDER — DEXAMETHASONE SODIUM PHOSPHATE 10 MG/ML IJ SOLN
10.0000 mg | Freq: Once | INTRAMUSCULAR | Status: AC
  Administered 2013-05-18: 10 mg via INTRAVENOUS

## 2013-05-18 NOTE — Patient Instructions (Signed)
[facility] Discharge Instructions for Patients Receiving Chemotherapy  Today you received the following chemotherapy agents Kyprolis To help prevent nausea and vomiting after your treatment, we encourage you to take your nausea medication as prescribed.  If you develop nausea and vomiting that is not controlled by your nausea medication, call the clinic.   BELOW ARE SYMPTOMS THAT SHOULD BE REPORTED IMMEDIATELY:  *FEVER GREATER THAN 100.5 F  *CHILLS WITH OR WITHOUT FEVER  NAUSEA AND VOMITING THAT IS NOT CONTROLLED WITH YOUR NAUSEA MEDICATION  *UNUSUAL SHORTNESS OF BREATH  *UNUSUAL BRUISING OR BLEEDING  TENDERNESS IN MOUTH AND THROAT WITH OR WITHOUT PRESENCE OF ULCERS  *URINARY PROBLEMS  *BOWEL PROBLEMS  UNUSUAL RASH Items with * indicate a potential emergency and should be followed up as soon as possible.  Feel free to call the clinic you have any questions or concerns. The clinic phone number is [phone].    

## 2013-05-20 NOTE — Progress Notes (Signed)
[facility] Telephone:([phone]) [phone]   [phone]  OFFICE PROGRESS NOTE  [name], MD [address] [place] [address]  Principle Diagnosis:  #1 recurrent multiple myeloma IgG kappa subtype diagnosed in June of 2008  #2 history of vasculitis and thrombosis of skin lesions   Prior Therapy:  #1 status post palliative radiotherapy to the left hip under the care of Dr. Roselind Messier  #2 status post 5 cycles of systemic chemotherapy with Revlimid and low dose Decadron. Last dose given June 2009 with good response.  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  #4 the patient had evidence for disease recurrence in December 2010.  #5 Revlimid 25 mg by mouth daily for 21 days every 4 weeks in addition to Decadron 40 mg orally on a weekly basis. The patient is status post 28 cycles, discontinued today secondary to disease progression.  #6 Systemic chemotherapy with Velcade 1.3 mg/M2 on days 1, 4, 8 and 11 in addition to Doxil 30 mg/M2 on day 4 and Decadron 40 mg by mouth on weekly basis every 3 weeks. Status post 3 cycles, last dose was given 05/04/2012 discontinued secondary to intolerance.  #7 Salvage therapy treatment with the Pomalyst 4 mg by mouth daily for 21 days every 28 days as well as Cytoxan 50 mg by mouth every other day and dexamethasone 40 mg by mouth once weekly. Therapy beginning 09/25/2012, status post 2 cycles discontinued secondary to disease progression and intolerance   Current therapy:  1) Kyprolis (Carfilzomib) 27 mg/M2 on days 1, 2, 8, 9, 15 and 16 every 4 weeks. First dose on 02/08/2013. This would be concurrent with the dexamethasone 40 mg by mouth on a weekly basis. Status post 3 cycles. Beginning with cycle #4 Cytoxan at 300 mg per meter square was added to the Kyprolis and dexamethasone 2) Zometa 4 mg IV given every 3 months.    INTERVAL HISTORY: [name]  52 y.o. male returns to the clinic today for followup visit accompanied his wife. He completed his course of antibiotics for bronchitis and his symptoms have completely resolved. He reports he is feeling much better today. He states that his blood pressure medications are now systolic 5 mg daily and amlodipine 10 mg daily. He requests to switch from gabapentin 2 Lyrica. He is now covered under his wife's insurance and Lyrica, will now be covered. He requests a refill for his dexamethasone. He reports some continued numbness affecting his feet. He also has some continued fatigue. He voiced no other specific complaints.  He completed 3 cycles of systemic chemotherapy with Carfilzomib and Decadron. He denied having any significant fever or chills, no chest pain, shortness breath, cough or hemoptysis. He has no nausea or vomiting.  MEDICAL HISTORY: Past Medical History  Diagnosis Date  . Multiple myeloma 09/24/2011  . Hypertension     ALLERGIES:  is allergic to red dye and other.  MEDICATIONS:  Current Outpatient Prescriptions  Medication Sig Dispense Refill  . nebivolol (BYSTOLIC) 5 MG tablet Take 5 mg by mouth daily.      [name] amLODipine (NORVASC) 10 MG tablet Take 10 mg by mouth daily.      . benzonatate (TESSALON) 200 MG capsule Take 200 mg by mouth 3 (three) times daily as needed.       [name] dexamethasone (DECADRON) 4 MG tablet Take 10 tablets (40 mg total) by mouth once a  week. Takes 40 mg Q Monday  40 tablet  2  . HYDROcodone-acetaminophen (NORCO) 10-325 MG per tablet Take 1-2 tablets by mouth every 6 (six) hours as needed for pain. For pain.  40 tablet  0  . omeprazole (PRILOSEC) 40 MG capsule Take 40 mg by mouth daily as needed (for heartburn).       [name] oxyCODONE (OXY IR/ROXICODONE) 5 MG immediate release tablet Take 1 tablet (5 mg total) by mouth as directed. Take 5mg  every 4-6 hours as needed for pain  40 tablet  0  . potassium chloride SA (K-DUR,KLOR-CON) 20 MEQ tablet Take 2 tablets (40 mEq  total) by mouth daily. daily x 7 days  14 tablet  0  . pregabalin (LYRICA) 50 MG capsule Take 1 capsule (50 mg total) by mouth 3 (three) times daily.  90 capsule  2  . warfarin (COUMADIN) 5 MG tablet TAKE 7.5MG (1 AND 1/2) DAILY EXCEPT 5MG (1 TAB) ON TUES, THURS, AND SAT, OR AS DIRECTED  40 tablet  2   No current facility-administered medications for this visit.    SURGICAL HISTORY:  Past Surgical History  Procedure Laterality Date  . Video bronchoscopy  12/11/2012    Procedure: VIDEO BRONCHOSCOPY;  Surgeon: Kerin Perna, MD;  Location: [facility] OR;  Service: Thoracic;  Laterality: N/A;  . Video assisted thoracoscopy  12/11/2012    Procedure: VIDEO ASSISTED THORACOSCOPY;  Surgeon: Kerin Perna, MD;  Location: [facility] OR;  Service: Thoracic;  Laterality: Right;  . Decortication  12/11/2012    Procedure: DECORTICATION;  Surgeon: Kerin Perna, MD;  Location: [facility] OR;  Service: Thoracic;  Laterality: N/A;    REVIEW OF SYSTEMS:  A comprehensive review of systems was negative except for: Constitutional: positive for fatigue   PHYSICAL EXAMINATION: General appearance: alert, cooperative, fatigued and no distress Head: Normocephalic, without obvious abnormality, atraumatic Neck: no adenopathy Lymph nodes: Cervical, supraclavicular, and axillary nodes normal. Resp: clear to auscultation bilaterally Cardio: regular rate and rhythm, S1, S2 normal, no murmur, click, rub or gallop GI: soft, non-tender; bowel sounds normal; no masses,  no organomegaly Extremities: extremities normal, atraumatic, no cyanosis or edema Neurologic: Alert and oriented X 3, normal strength and tone. Normal symmetric reflexes. Normal coordination and gait Skin: Examination the area of vasculitis on the right lower abdomen reveals eschar formation without any active bleeding or purulent drainage. The skin is generally dry.  ECOG PERFORMANCE STATUS: 1 - Symptomatic but completely ambulatory  There were no vitals taken for  this visit.  LABORATORY DATA: Lab Results  Component Value Date   WBC 6.9 05/17/2013   HGB 8.5* 05/17/2013   HCT 26.1* 05/17/2013   MCV 92.6 05/17/2013   PLT 113* 05/17/2013      Chemistry      Component Value Date/Time   NA 139 05/17/2013 0835   NA 136 03/16/2013 1930   K 3.2* 05/17/2013 0835   K 3.8 03/16/2013 1930   CL 112* 05/10/2013 0849   CL 110 03/16/2013 1930   CO2 22 05/17/2013 0835   CO2 16* 03/16/2013 1930   BUN 12.2 05/17/2013 0835   BUN 22 03/16/2013 1930   CREATININE 0.8 05/17/2013 0835   CREATININE 1.24 03/16/2013 1930      Component Value Date/Time   CALCIUM 8.8 05/17/2013 0835   CALCIUM 8.2* 03/16/2013 1930   ALKPHOS 39* 05/17/2013 0835   ALKPHOS 31* 03/16/2013 1930   AST 7 05/17/2013 0835   AST 11 03/16/2013 1930   ALT 8  05/17/2013 0835   ALT 15 03/16/2013 1930   BILITOT 0.51 05/17/2013 0835   BILITOT 0.2* 03/16/2013 1930       RADIOGRAPHIC STUDIES: Dg Chest 2 View  04/19/2013   *RADIOLOGY REPORT*  Clinical Data: right empyema  CHEST - 2 VIEW  Comparison: Chest radiograph 02/05/2013  Findings: Right power port in place with tip in distal SVC.  There is trace right pleural effusions again demonstrated.  No consolidation.  No pneumothorax.  IMPRESSION: Trace residual right pleural effusion.   Original Report Authenticated By: Genevive Bi, M.D.    ASSESSMENT AND PLAN: This is a very pleasant 52 years old African American male with recurrent multiple myeloma currently on treatment with Carfilzomib and Decadron status post 3 cycles with some improvement in his disease.  Patient was discussed with Dr. Arbutus Ped  1) multiple myeloma: Patient will proceed with his treatment with Carfilzomib and Decadron with the addition of Cytoxan as scheduled today.  2) chemotherapy-induced anemia: [place]'s 8.5 mg/dL today we will continue to monitor his chemotherapy induced anemia and transfuse packed red blood cells as needed. 3) sore throat and acute bronchitis: Resolved  4) he will  followup with Dr. Arbutus Ped in one week for evaluation and management any adverse effect of his chemotherapy. 5) the patient is advised to apply topical emollients to the skin in general as well as to the healing area of vasculitis on his right lower abdomen. Patient voiced understanding    Conni Slipper, PA-C   All questions were answered. The patient knows to call the clinic with any problems, questions or concerns. We can certainly see the patient much sooner if necessary.

## 2013-05-24 ENCOUNTER — Ambulatory Visit: Payer: 59 | Admitting: Pharmacist

## 2013-05-24 ENCOUNTER — Ambulatory Visit (HOSPITAL_BASED_OUTPATIENT_CLINIC_OR_DEPARTMENT_OTHER): Payer: 59 | Admitting: Internal Medicine

## 2013-05-24 ENCOUNTER — Telehealth: Payer: Self-pay | Admitting: Internal Medicine

## 2013-05-24 ENCOUNTER — Ambulatory Visit: Payer: 59 | Admitting: Physician Assistant

## 2013-05-24 ENCOUNTER — Other Ambulatory Visit (HOSPITAL_BASED_OUTPATIENT_CLINIC_OR_DEPARTMENT_OTHER): Payer: 59 | Admitting: Lab

## 2013-05-24 ENCOUNTER — Telehealth: Payer: Self-pay | Admitting: *Deleted

## 2013-05-24 ENCOUNTER — Encounter: Payer: Self-pay | Admitting: Internal Medicine

## 2013-05-24 ENCOUNTER — Ambulatory Visit (HOSPITAL_BASED_OUTPATIENT_CLINIC_OR_DEPARTMENT_OTHER): Payer: 59

## 2013-05-24 VITALS — BP 141/71 | HR 75 | Temp 97.9°F | Resp 18 | Ht 63.0 in | Wt 219.8 lb

## 2013-05-24 DIAGNOSIS — C9000 Multiple myeloma not having achieved remission: Secondary | ICD-10-CM

## 2013-05-24 DIAGNOSIS — Z5112 Encounter for antineoplastic immunotherapy: Secondary | ICD-10-CM

## 2013-05-24 DIAGNOSIS — C9002 Multiple myeloma in relapse: Secondary | ICD-10-CM

## 2013-05-24 DIAGNOSIS — I776 Arteritis, unspecified: Secondary | ICD-10-CM

## 2013-05-24 DIAGNOSIS — T451X5A Adverse effect of antineoplastic and immunosuppressive drugs, initial encounter: Secondary | ICD-10-CM

## 2013-05-24 LAB — COMPREHENSIVE METABOLIC PANEL (CC13)
ALT: 10 U/L (ref 0–55)
AST: 12 U/L (ref 5–34)
Albumin: 2.6 g/dL — ABNORMAL LOW (ref 3.5–5.0)
Alkaline Phosphatase: 32 U/L — ABNORMAL LOW (ref 40–150)
Glucose: 103 mg/dl (ref 70–140)
Potassium: 4.2 mEq/L (ref 3.5–5.1)
Sodium: 134 mEq/L — ABNORMAL LOW (ref 136–145)
Total Protein: 9.9 g/dL — ABNORMAL HIGH (ref 6.4–8.3)

## 2013-05-24 LAB — CBC WITH DIFFERENTIAL/PLATELET
Eosinophils Absolute: 0 10*3/uL (ref 0.0–0.5)
LYMPH%: 15.5 % (ref 14.0–49.0)
MCV: 95.1 fL (ref 79.3–98.0)
MONO%: 13 % (ref 0.0–14.0)
NEUT#: 3.1 10*3/uL (ref 1.5–6.5)
Platelets: 93 10*3/uL — ABNORMAL LOW (ref 140–400)
RBC: 2.64 10*6/uL — ABNORMAL LOW (ref 4.20–5.82)
nRBC: 2 % — ABNORMAL HIGH (ref 0–0)

## 2013-05-24 LAB — PROTIME-INR: Protime: 18 Seconds — ABNORMAL HIGH (ref 10.6–13.4)

## 2013-05-24 LAB — POCT INR: INR: 1.5

## 2013-05-24 MED ORDER — SODIUM CHLORIDE 0.9 % IJ SOLN
10.0000 mL | INTRAMUSCULAR | Status: DC | PRN
  Administered 2013-05-24: 10 mL
  Filled 2013-05-24: qty 10

## 2013-05-24 MED ORDER — ONDANSETRON 8 MG/50ML IVPB (CHCC)
8.0000 mg | Freq: Once | INTRAVENOUS | Status: AC
  Administered 2013-05-24: 8 mg via INTRAVENOUS

## 2013-05-24 MED ORDER — DEXTROSE 5 % IV SOLN
27.0000 mg/m2 | Freq: Once | INTRAVENOUS | Status: AC
  Administered 2013-05-24: 56 mg via INTRAVENOUS
  Filled 2013-05-24: qty 28

## 2013-05-24 MED ORDER — HEPARIN SOD (PORK) LOCK FLUSH 100 UNIT/ML IV SOLN
500.0000 [IU] | Freq: Once | INTRAVENOUS | Status: AC | PRN
  Administered 2013-05-24: 500 [IU]
  Filled 2013-05-24: qty 5

## 2013-05-24 MED ORDER — SODIUM CHLORIDE 0.9 % IV SOLN
300.0000 mg/m2 | Freq: Once | INTRAVENOUS | Status: AC
  Administered 2013-05-24: 640 mg via INTRAVENOUS
  Filled 2013-05-24: qty 32

## 2013-05-24 MED ORDER — SODIUM CHLORIDE 0.9 % IV SOLN
Freq: Once | INTRAVENOUS | Status: AC
  Administered 2013-05-24: 10:00:00 via INTRAVENOUS

## 2013-05-24 MED ORDER — DEXAMETHASONE SODIUM PHOSPHATE 10 MG/ML IJ SOLN
10.0000 mg | Freq: Once | INTRAMUSCULAR | Status: AC
  Administered 2013-05-24: 10 mg via INTRAVENOUS

## 2013-05-24 NOTE — Patient Instructions (Addendum)
[facility] Discharge Instructions for Patients Receiving Chemotherapy  Today you received the following chemotherapy agents: Carboplatin, Kyprolis.  To help prevent nausea and vomiting after your treatment, we encourage you to take your nausea medication.   If you develop nausea and vomiting that is not controlled by your nausea medication, call the clinic.   BELOW ARE SYMPTOMS THAT SHOULD BE REPORTED IMMEDIATELY:  *FEVER GREATER THAN 100.5 F  *CHILLS WITH OR WITHOUT FEVER  NAUSEA AND VOMITING THAT IS NOT CONTROLLED WITH YOUR NAUSEA MEDICATION  *UNUSUAL SHORTNESS OF BREATH  *UNUSUAL BRUISING OR BLEEDING  TENDERNESS IN MOUTH AND THROAT WITH OR WITHOUT PRESENCE OF ULCERS  *URINARY PROBLEMS  *BOWEL PROBLEMS  UNUSUAL RASH Items with * indicate a potential emergency and should be followed up as soon as possible.  Feel free to call the clinic you have any questions or concerns. The clinic phone number is [phone].

## 2013-05-24 NOTE — Telephone Encounter (Signed)
Per staff message and POF I have scheduled appts.  [name]  

## 2013-05-24 NOTE — Progress Notes (Signed)
INR = 1.5 Pt is taking Coumadin 5 mg daily.  He missed a dose on Friday. No sxs of VTE per pt. INR is low today.  Partly due to omission of dose. Take 5 mg daily except 7.5 mg on Mondays. Recheck protime next week. Ebony Hail, Pharm.D., CPP 05/24/2013@11 :25 AM

## 2013-05-24 NOTE — Progress Notes (Signed)
[facility] [phone]   Fax:(336) [phone]  OFFICE PROGRESS NOTE  [name], MD [address] [place] [address]  Principle Diagnosis:  #1 recurrent multiple myeloma IgG kappa subtype diagnosed in June of 2008  #2 history of vasculitis and thrombosis of skin lesions   Prior Therapy:  #1 status post palliative radiotherapy to the left hip under the care of Dr. Roselind Messier  #2 status post 5 cycles of systemic chemotherapy with Revlimid and low dose Decadron. Last dose given June 2009 with good response.  #3 status post autologous peripheral blood stem cell transplant at [facility] on 02/02/2008.  #4 the patient had evidence for disease recurrence in December 2010.  #5 Revlimid 25 mg by mouth daily for 21 days every 4 weeks in addition to Decadron 40 mg orally on a weekly basis. The patient is status post 28 cycles, discontinued today secondary to disease progression.  #6 Systemic chemotherapy with Velcade 1.3 mg/M2 on days 1, 4, 8 and 11 in addition to Doxil 30 mg/M2 on day 4 and Decadron 40 mg by mouth on weekly basis every 3 weeks. Status post 3 cycles, last dose was given 05/04/2012 discontinued secondary to intolerance.  #7 Salvage therapy treatment with the Pomalyst 4 mg by mouth daily for 21 days every 28 days as well as Cytoxan 50 mg by mouth every other day and dexamethasone 40 mg by mouth once weekly. Therapy beginning 09/25/2012, status post 2 cycles discontinued secondary to disease progression and intolerance. #8 Kyprolis (Carfilzomib) 27 mg/M2 on days 1, 2, 8, 9, 15 and 16 every 4 weeks. First dose on 02/08/2013. This would be concurrent with the dexamethasone 40 mg by mouth on a weekly basis. Status post 3 cycles with disease progression.  Current therapy:  1) Systemic chemotherapy with Carfilzomib 27 mg/M2 days 1, , 8, 9, 15 and 16, Cytoxan 300 mg/M2 and dexamethasone 40 mg by mouth weekly every 4 weeks. He is  currently undergoing cycle #1 2) Zometa 4 mg IV given every 3 months.    INTERVAL HISTORY: [name] PUNDT 52 y.o. male returns to the clinic today for followup visit. The patient is feeling fine today with no specific complaints. He is tolerating his current treatment was carboplatin, Cytoxan and dexamethasone fairly well. He denied having any significant nausea or vomiting. He has no fever or chills. He has no significant weight loss or night sweats. The patient denied having any significant chest pain, shortness breath, cough or hemoptysis.  MEDICAL HISTORY: Past Medical History  Diagnosis Date  . Multiple myeloma 09/24/2011  . Hypertension     ALLERGIES:  is allergic to red dye and other.  MEDICATIONS:  Current Outpatient Prescriptions  Medication Sig Dispense Refill  . amLODipine (NORVASC) 10 MG tablet Take 10 mg by mouth daily.      . benzonatate (TESSALON) 200 MG capsule Take 200 mg by mouth 3 (three) times daily as needed.       [name] dexamethasone (DECADRON) 4 MG tablet Take 10 tablets (40 mg total) by mouth once a week. Takes 40 mg Q Monday  40 tablet  2  . HYDROcodone-acetaminophen (NORCO) 10-325 MG per tablet Take 1-2 tablets by mouth every 6 (six) hours as needed for pain. For pain.  40 tablet  0  . nebivolol (BYSTOLIC) 5 MG tablet Take 5 mg by mouth daily.      [name] omeprazole (PRILOSEC) 40 MG capsule Take 40 mg  by mouth daily as needed (for heartburn).       [name] oxyCODONE (OXY IR/ROXICODONE) 5 MG immediate release tablet Take 1 tablet (5 mg total) by mouth as directed. Take 5mg  every 4-6 hours as needed for pain  40 tablet  0  . pregabalin (LYRICA) 50 MG capsule Take 1 capsule (50 mg total) by mouth 3 (three) times daily.  90 capsule  2  . warfarin (COUMADIN) 5 MG tablet TAKE 7.5MG (1 AND 1/2) DAILY EXCEPT 5MG (1 TAB) ON TUES, THURS, AND SAT, OR AS DIRECTED  40 tablet  2   No current facility-administered medications for this visit.    SURGICAL HISTORY:  Past Surgical History    Procedure Laterality Date  . Video bronchoscopy  12/11/2012    Procedure: VIDEO BRONCHOSCOPY;  Surgeon: Kerin Perna, MD;  Location: [facility] OR;  Service: Thoracic;  Laterality: N/A;  . Video assisted thoracoscopy  12/11/2012    Procedure: VIDEO ASSISTED THORACOSCOPY;  Surgeon: Kerin Perna, MD;  Location: [facility] OR;  Service: Thoracic;  Laterality: Right;  . Decortication  12/11/2012    Procedure: DECORTICATION;  Surgeon: Kerin Perna, MD;  Location: [facility] OR;  Service: Thoracic;  Laterality: N/A;    REVIEW OF SYSTEMS:  A comprehensive review of systems was negative except for: Constitutional: positive for fatigue   PHYSICAL EXAMINATION: General appearance: alert, cooperative and no distress Head: Normocephalic, without obvious abnormality, atraumatic Neck: no adenopathy Lymph nodes: Cervical, supraclavicular, and axillary nodes normal. Resp: clear to auscultation bilaterally Cardio: regular rate and rhythm, S1, S2 normal, no murmur, click, rub or gallop GI: soft, non-tender; bowel sounds normal; no masses,  no organomegaly Extremities: extremities normal, atraumatic, no cyanosis or edema Neurologic: Alert and oriented X 3, normal strength and tone. Normal symmetric reflexes. Normal coordination and gait  ECOG PERFORMANCE STATUS: 1 - Symptomatic but completely ambulatory  Blood pressure 141/71, pulse 75, temperature 97.9 F (36.6 C), temperature source Oral, resp. rate 18, height 5\' 3"  (1.6 m), weight 219 lb 12.8 oz (99.701 kg), SpO2 100.00%.  LABORATORY DATA: Lab Results  Component Value Date   WBC 4.4 05/24/2013   HGB 8.0* 05/24/2013   HCT 25.1* 05/24/2013   MCV 95.1 05/24/2013   PLT 93* 05/24/2013      Chemistry      Component Value Date/Time   NA 139 05/17/2013 0835   NA 136 03/16/2013 1930   K 3.2* 05/17/2013 0835   K 3.8 03/16/2013 1930   CL 112* 05/10/2013 0849   CL 110 03/16/2013 1930   CO2 22 05/17/2013 0835   CO2 16* 03/16/2013 1930   BUN 12.2 05/17/2013 0835   BUN 22 03/16/2013  1930   CREATININE 0.8 05/17/2013 0835   CREATININE 1.24 03/16/2013 1930      Component Value Date/Time   CALCIUM 8.8 05/17/2013 0835   CALCIUM 8.2* 03/16/2013 1930   ALKPHOS 39* 05/17/2013 0835   ALKPHOS 31* 03/16/2013 1930   AST 7 05/17/2013 0835   AST 11 03/16/2013 1930   ALT 8 05/17/2013 0835   ALT 15 03/16/2013 1930   BILITOT 0.51 05/17/2013 0835   BILITOT 0.2* 03/16/2013 1930       RADIOGRAPHIC STUDIES: No results found.  ASSESSMENT AND PLAN: This is a very pleasant 52 years old [place] American male with recurrent multiple myeloma currently on treatment with Carfilzomib, Cytoxan and dexamethasone and tolerating his treatment fairly well. Will continue with the remaining part of cycle #1 today as scheduled. The patient would come back  for followup visit in 2 weeks for evaluation before starting cycle #2. For the chemotherapy-induced anemia, I will continue to monitor the patient closely and consider him for PRBCs transfusion if needed. He was advised to call immediately if he has any concerning symptoms in the interval.  All questions were answered. The patient knows to call the clinic with any problems, questions or concerns. We can certainly see the patient much sooner if necessary.  I spent 15 minutes counseling the patient face to face. The total time spent in the appointment was 25 minutes.

## 2013-05-24 NOTE — Patient Instructions (Signed)
Followup visit in 2 weeks. Continue chemotherapy as scheduled.

## 2013-05-24 NOTE — Telephone Encounter (Signed)
gv and printed appt sched and avs for ot....MW added tx.Marland KitchenMarland Kitchen

## 2013-05-25 ENCOUNTER — Ambulatory Visit (HOSPITAL_BASED_OUTPATIENT_CLINIC_OR_DEPARTMENT_OTHER): Payer: 59

## 2013-05-25 VITALS — BP 140/78 | HR 68 | Temp 98.6°F | Resp 20

## 2013-05-25 DIAGNOSIS — C9000 Multiple myeloma not having achieved remission: Secondary | ICD-10-CM

## 2013-05-25 DIAGNOSIS — Z5112 Encounter for antineoplastic immunotherapy: Secondary | ICD-10-CM

## 2013-05-25 MED ORDER — SODIUM CHLORIDE 0.9 % IV SOLN
Freq: Once | INTRAVENOUS | Status: AC
Start: 2013-05-25 — End: 2013-05-25
  Administered 2013-05-25: 09:00:00 via INTRAVENOUS

## 2013-05-25 MED ORDER — HEPARIN SOD (PORK) LOCK FLUSH 100 UNIT/ML IV SOLN
500.0000 [IU] | Freq: Once | INTRAVENOUS | Status: AC | PRN
  Administered 2013-05-25: 500 [IU]
  Filled 2013-05-25: qty 5

## 2013-05-25 MED ORDER — SODIUM CHLORIDE 0.9 % IV SOLN: Freq: Once | INTRAVENOUS | Status: DC

## 2013-05-25 MED ORDER — SODIUM CHLORIDE 0.9 % IJ SOLN
10.0000 mL | INTRAMUSCULAR | Status: DC | PRN
  Administered 2013-05-25: 10 mL
  Filled 2013-05-25: qty 10

## 2013-05-25 MED ORDER — ONDANSETRON 8 MG/50ML IVPB (CHCC)
8.0000 mg | Freq: Once | INTRAVENOUS | Status: AC
  Administered 2013-05-25: 8 mg via INTRAVENOUS

## 2013-05-25 MED ORDER — DEXTROSE 5 % IV SOLN
27.0000 mg/m2 | Freq: Once | INTRAVENOUS | Status: AC
  Administered 2013-05-25: 56 mg via INTRAVENOUS
  Filled 2013-05-25: qty 28

## 2013-05-25 MED ORDER — DEXAMETHASONE SODIUM PHOSPHATE 10 MG/ML IJ SOLN
10.0000 mg | Freq: Once | INTRAMUSCULAR | Status: AC
  Administered 2013-05-25: 10 mg via INTRAVENOUS

## 2013-05-25 NOTE — Patient Instructions (Addendum)
[facility] Discharge Instructions for Patients Receiving Chemotherapy  Today you received the following chemotherapy agents Kyprolis To help prevent nausea and vomiting after your treatment, we encourage you to take your nausea medication as prescribed.  If you develop nausea and vomiting that is not controlled by your nausea medication, call the clinic.   BELOW ARE SYMPTOMS THAT SHOULD BE REPORTED IMMEDIATELY:  *FEVER GREATER THAN 100.5 F  *CHILLS WITH OR WITHOUT FEVER  NAUSEA AND VOMITING THAT IS NOT CONTROLLED WITH YOUR NAUSEA MEDICATION  *UNUSUAL SHORTNESS OF BREATH  *UNUSUAL BRUISING OR BLEEDING  TENDERNESS IN MOUTH AND THROAT WITH OR WITHOUT PRESENCE OF ULCERS  *URINARY PROBLEMS  *BOWEL PROBLEMS  UNUSUAL RASH Items with * indicate a potential emergency and should be followed up as soon as possible.  Feel free to call the clinic you have any questions or concerns. The clinic phone number is [phone].    

## 2013-05-31 ENCOUNTER — Other Ambulatory Visit (HOSPITAL_BASED_OUTPATIENT_CLINIC_OR_DEPARTMENT_OTHER): Payer: 59

## 2013-05-31 ENCOUNTER — Ambulatory Visit (HOSPITAL_BASED_OUTPATIENT_CLINIC_OR_DEPARTMENT_OTHER): Payer: 59 | Admitting: Pharmacist

## 2013-05-31 DIAGNOSIS — I776 Arteritis, unspecified: Secondary | ICD-10-CM

## 2013-05-31 LAB — PROTIME-INR
INR: 2 (ref 2.00–3.50)
Protime: 24 Seconds — ABNORMAL HIGH (ref 10.6–13.4)

## 2013-05-31 NOTE — Progress Notes (Signed)
INR within goal today. Pt took coumadin as instructed. Slight modification in his diet. He usually avoids "greens" and he recently had coleslaw. We discussed consistency with Vitamin K intake and told him he could continue to add Vitamin K containing foods and we can adjust his Coumadin as needed. He prefers to avoid greens. No problems to report regarding anticoagulation. No other changes to report. Will continue Coumadin 5mg  daily except 7.5 mg on Mondays. Recheck PT/INR on 06/07/13; lab at 8:15am, Dr. Arbutus Ped at 8:45am, Treatment at 9:30am and coumadin clinic at 9:45am.

## 2013-05-31 NOTE — Patient Instructions (Signed)
Continue Coumadin 5mg  daily except 7.5 mg on Mondays. Will recheck PT/INR on 06/07/13; lab at 8:15am, Dr. Arbutus Ped at 8:45am, Treatment at 9:30am and coumadin clinic at 9:45am.

## 2013-06-04 ENCOUNTER — Other Ambulatory Visit: Payer: Self-pay | Admitting: *Deleted

## 2013-06-04 DIAGNOSIS — C9000 Multiple myeloma not having achieved remission: Secondary | ICD-10-CM

## 2013-06-07 ENCOUNTER — Ambulatory Visit (HOSPITAL_BASED_OUTPATIENT_CLINIC_OR_DEPARTMENT_OTHER): Payer: 59

## 2013-06-07 ENCOUNTER — Encounter: Payer: Self-pay | Admitting: Internal Medicine

## 2013-06-07 ENCOUNTER — Other Ambulatory Visit: Payer: Self-pay | Admitting: Medical Oncology

## 2013-06-07 ENCOUNTER — Ambulatory Visit (HOSPITAL_BASED_OUTPATIENT_CLINIC_OR_DEPARTMENT_OTHER): Payer: 59 | Admitting: Internal Medicine

## 2013-06-07 ENCOUNTER — Telehealth: Payer: Self-pay | Admitting: Internal Medicine

## 2013-06-07 ENCOUNTER — Ambulatory Visit: Payer: 59 | Admitting: Pharmacist

## 2013-06-07 ENCOUNTER — Encounter (HOSPITAL_COMMUNITY)
Admission: RE | Admit: 2013-06-07 | Discharge: 2013-06-07 | Disposition: A | Discharge status: Home / Self Care | Payer: 59 | Source: Ambulatory Visit | Attending: Internal Medicine | Admitting: Internal Medicine

## 2013-06-07 ENCOUNTER — Other Ambulatory Visit (HOSPITAL_BASED_OUTPATIENT_CLINIC_OR_DEPARTMENT_OTHER): Payer: 59

## 2013-06-07 ENCOUNTER — Other Ambulatory Visit: Payer: 59 | Admitting: Lab

## 2013-06-07 VITALS — BP 140/79 | HR 90 | Temp 99.1°F | Resp 18 | Ht 63.0 in | Wt 224.1 lb

## 2013-06-07 DIAGNOSIS — C9000 Multiple myeloma not having achieved remission: Secondary | ICD-10-CM

## 2013-06-07 DIAGNOSIS — Z5112 Encounter for antineoplastic immunotherapy: Secondary | ICD-10-CM

## 2013-06-07 DIAGNOSIS — C9002 Multiple myeloma in relapse: Secondary | ICD-10-CM

## 2013-06-07 DIAGNOSIS — I776 Arteritis, unspecified: Secondary | ICD-10-CM

## 2013-06-07 DIAGNOSIS — T451X5A Adverse effect of antineoplastic and immunosuppressive drugs, initial encounter: Secondary | ICD-10-CM | POA: Insufficient documentation

## 2013-06-07 DIAGNOSIS — D6481 Anemia due to antineoplastic chemotherapy: Secondary | ICD-10-CM

## 2013-06-07 DIAGNOSIS — D649 Anemia, unspecified: Secondary | ICD-10-CM | POA: Insufficient documentation

## 2013-06-07 DIAGNOSIS — Z5111 Encounter for antineoplastic chemotherapy: Secondary | ICD-10-CM

## 2013-06-07 LAB — PROTIME-INR: INR: 2.3 (ref 2.00–3.50)

## 2013-06-07 LAB — COMPREHENSIVE METABOLIC PANEL (CC13)
ALT: 13 U/L (ref 0–55)
AST: 14 U/L (ref 5–34)
CO2: 23 mEq/L (ref 22–29)
Calcium: 8.9 mg/dL (ref 8.4–10.4)
Chloride: 108 mEq/L (ref 98–109)
Creatinine: 0.8 mg/dL (ref 0.7–1.3)
Potassium: 3.4 mEq/L — ABNORMAL LOW (ref 3.5–5.1)
Sodium: 137 mEq/L (ref 136–145)
Total Protein: 9.8 g/dL — ABNORMAL HIGH (ref 6.4–8.3)

## 2013-06-07 LAB — CBC WITH DIFFERENTIAL/PLATELET
Basophils Absolute: 0 10*3/uL (ref 0.0–0.1)
Eosinophils Absolute: 0 10*3/uL (ref 0.0–0.5)
HCT: 24.4 % — ABNORMAL LOW (ref 38.4–49.9)
HGB: 7.7 g/dL — ABNORMAL LOW (ref 13.0–17.1)
LYMPH%: 12.1 % — ABNORMAL LOW (ref 14.0–49.0)
MCHC: 31.6 g/dL — ABNORMAL LOW (ref 32.0–36.0)
MONO#: 0.6 10*3/uL (ref 0.1–0.9)
NEUT#: 2.9 10*3/uL (ref 1.5–6.5)
NEUT%: 72.6 % (ref 39.0–75.0)
Platelets: 120 10*3/uL — ABNORMAL LOW (ref 140–400)
WBC: 4.1 10*3/uL (ref 4.0–10.3)
lymph#: 0.5 10*3/uL — ABNORMAL LOW (ref 0.9–3.3)

## 2013-06-07 LAB — TECHNOLOGIST REVIEW

## 2013-06-07 LAB — HOLD TUBE, BLOOD BANK

## 2013-06-07 MED ORDER — SODIUM CHLORIDE 0.9 % IV SOLN
Freq: Once | INTRAVENOUS | Status: AC
  Administered 2013-06-07: 10:00:00 via INTRAVENOUS

## 2013-06-07 MED ORDER — ZOLEDRONIC ACID 4 MG/100ML IV SOLN
4.0000 mg | Freq: Once | INTRAVENOUS | Status: AC
  Administered 2013-06-07: 4 mg via INTRAVENOUS
  Filled 2013-06-07: qty 100

## 2013-06-07 MED ORDER — ONDANSETRON 8 MG/50ML IVPB (CHCC)
8.0000 mg | Freq: Once | INTRAVENOUS | Status: AC
  Administered 2013-06-07: 8 mg via INTRAVENOUS

## 2013-06-07 MED ORDER — HEPARIN SOD (PORK) LOCK FLUSH 100 UNIT/ML IV SOLN
500.0000 [IU] | Freq: Once | INTRAVENOUS | Status: AC | PRN
  Administered 2013-06-07: 500 [IU]
  Filled 2013-06-07: qty 5

## 2013-06-07 MED ORDER — SODIUM CHLORIDE 0.9 % IV SOLN
300.0000 mg/m2 | Freq: Once | INTRAVENOUS | Status: AC
  Administered 2013-06-07: 640 mg via INTRAVENOUS
  Filled 2013-06-07: qty 32

## 2013-06-07 MED ORDER — SODIUM CHLORIDE 0.9 % IJ SOLN
10.0000 mL | INTRAMUSCULAR | Status: DC | PRN
  Administered 2013-06-07: 10 mL
  Filled 2013-06-07: qty 10

## 2013-06-07 MED ORDER — SODIUM CHLORIDE 0.9 % IV SOLN
Freq: Once | INTRAVENOUS | Status: AC
  Administered 2013-06-07: 12:00:00 via INTRAVENOUS

## 2013-06-07 MED ORDER — DEXTROSE 5 % IV SOLN
27.0000 mg/m2 | Freq: Once | INTRAVENOUS | Status: AC
  Administered 2013-06-07: 56 mg via INTRAVENOUS
  Filled 2013-06-07: qty 28

## 2013-06-07 MED ORDER — DEXAMETHASONE SODIUM PHOSPHATE 10 MG/ML IJ SOLN
10.0000 mg | Freq: Once | INTRAMUSCULAR | Status: AC
  Administered 2013-06-07: 10 mg via INTRAVENOUS

## 2013-06-07 NOTE — Telephone Encounter (Signed)
s.w. pt and advied on appts...pt ok and aware

## 2013-06-07 NOTE — Progress Notes (Signed)
[facility] [phone]   [phone]  OFFICE PROGRESS NOTE  [name], MD [address] [place] [address]  Principle Diagnosis:  #1 recurrent multiple myeloma IgG kappa subtype diagnosed in June of 2008  #2 history of vasculitis and thrombosis of skin lesions   Prior Therapy:  #1 status post palliative radiotherapy to the left hip under the care of Dr. Roselind Messier  #2 status post 5 cycles of systemic chemotherapy with Revlimid and low dose Decadron. Last dose given June 2009 with good response.  #3 status post autologous peripheral blood stem cell transplant at [facility] on 02/02/2008.  #4 the patient had evidence for disease recurrence in December 2010.  #5 Revlimid 25 mg by mouth daily for 21 days every 4 weeks in addition to Decadron 40 mg orally on a weekly basis. The patient is status post 28 cycles, discontinued today secondary to disease progression.  #6 Systemic chemotherapy with Velcade 1.3 mg/M2 on days 1, 4, 8 and 11 in addition to Doxil 30 mg/M2 on day 4 and Decadron 40 mg by mouth on weekly basis every 3 weeks. Status post 3 cycles, last dose was given 05/04/2012 discontinued secondary to intolerance.  #7 Salvage therapy treatment with the Pomalyst 4 mg by mouth daily for 21 days every 28 days as well as Cytoxan 50 mg by mouth every other day and dexamethasone 40 mg by mouth once weekly. Therapy beginning 09/25/2012, status post 2 cycles discontinued secondary to disease progression and intolerance.  #8 Kyprolis (Carfilzomib) 27 mg/M2 on days 1, 2, 8, 9, 15 and 16 every 4 weeks. First dose on 02/08/2013. This would be concurrent with the dexamethasone 40 mg by mouth on a weekly basis. Status post 3 cycles with disease progression.   Current therapy:  1) Systemic chemotherapy with Carfilzomib 27 mg/M2 days 1, , 8, 9, 15 and 16, Cytoxan 300 mg/M2 and dexamethasone 40 mg by mouth weekly every 4 weeks. Status  post 1 cycle. 2) Zometa 4 mg IV given every 3 months.    INTERVAL HISTORY: [name] 52 y.o. male returns to the clinic today for followup visit accompanied his wife. The patient is feeling fine today with no specific complaints. He tolerated the first cycle of his systemic chemotherapy with Carfilzomib, Cytoxan and dexamethasone fairly well. The patient denied having any significant fever or chills. He denied having any chest pain, shortness breath, cough or hemoptysis. He continues to have mild fatigue.  MEDICAL HISTORY: Past Medical History  Diagnosis Date  . Multiple myeloma 09/24/2011  . Hypertension     ALLERGIES:  is allergic to red dye and other.  MEDICATIONS:  Current Outpatient Prescriptions  Medication Sig Dispense Refill  . amLODipine (NORVASC) 10 MG tablet Take 10 mg by mouth daily.      . benzonatate (TESSALON) 200 MG capsule Take 200 mg by mouth 3 (three) times daily as needed.       [name] dexamethasone (DECADRON) 4 MG tablet Take 10 tablets (40 mg total) by mouth once a week. Takes 40 mg Q Monday  40 tablet  2  . HYDROcodone-acetaminophen (NORCO) 10-325 MG per tablet Take 1-2 tablets by mouth every 6 (six) hours as needed for pain. For pain.  40 tablet  0  . nebivolol (BYSTOLIC) 5 MG tablet Take 5 mg by mouth daily.      [name] omeprazole (PRILOSEC) 40 MG capsule Take 40 mg by mouth daily as  needed (for heartburn).       [name] oxyCODONE (OXY IR/ROXICODONE) 5 MG immediate release tablet Take 1 tablet (5 mg total) by mouth as directed. Take 5mg  every 4-6 hours as needed for pain  40 tablet  0  . pregabalin (LYRICA) 50 MG capsule Take 1 capsule (50 mg total) by mouth 3 (three) times daily.  90 capsule  2  . warfarin (COUMADIN) 5 MG tablet TAKE 7.5MG (1 AND 1/2) DAILY EXCEPT 5MG (1 TAB) ON TUES, THURS, AND SAT, OR AS DIRECTED  40 tablet  2   No current facility-administered medications for this visit.    SURGICAL HISTORY:  Past Surgical History  Procedure Laterality Date  .  Video bronchoscopy  12/11/2012    Procedure: VIDEO BRONCHOSCOPY;  Surgeon: Kerin Perna, MD;  Location: [facility] OR;  Service: Thoracic;  Laterality: N/A;  . Video assisted thoracoscopy  12/11/2012    Procedure: VIDEO ASSISTED THORACOSCOPY;  Surgeon: Kerin Perna, MD;  Location: [facility] OR;  Service: Thoracic;  Laterality: Right;  . Decortication  12/11/2012    Procedure: DECORTICATION;  Surgeon: Kerin Perna, MD;  Location: [facility] OR;  Service: Thoracic;  Laterality: N/A;    REVIEW OF SYSTEMS:  A comprehensive review of systems was negative except for: Constitutional: positive for fatigue   PHYSICAL EXAMINATION: General appearance: alert, cooperative, fatigued and no distress Head: Normocephalic, without obvious abnormality, atraumatic Neck: no adenopathy Lymph nodes: Cervical, supraclavicular, and axillary nodes normal. Resp: clear to auscultation bilaterally Cardio: regular rate and rhythm, S1, S2 normal, no murmur, click, rub or gallop GI: soft, non-tender; bowel sounds normal; no masses,  no organomegaly Extremities: extremities normal, atraumatic, no cyanosis or edema Neurologic: Alert and oriented X 3, normal strength and tone. Normal symmetric reflexes. Normal coordination and gait  ECOG PERFORMANCE STATUS: 1 - Symptomatic but completely ambulatory  Blood pressure 140/79, pulse 90, temperature 99.1 F (37.3 C), temperature source Oral, resp. rate 18, height 5\' 3"  (1.6 m), weight 224 lb 1.6 oz (101.651 kg).  LABORATORY DATA: Lab Results  Component Value Date   WBC 4.4 05/24/2013   HGB 8.0* 05/24/2013   HCT 25.1* 05/24/2013   MCV 95.1 05/24/2013   PLT 93* 05/24/2013      Chemistry      Component Value Date/Time   NA 134* 05/24/2013 0845   NA 136 03/16/2013 1930   K 4.2 05/24/2013 0845   K 3.8 03/16/2013 1930   CL 112* 05/10/2013 0849   CL 110 03/16/2013 1930   CO2 24 05/24/2013 0845   CO2 16* 03/16/2013 1930   BUN 11.3 05/24/2013 0845   BUN 22 03/16/2013 1930   CREATININE 0.8 05/24/2013 0845    CREATININE 1.24 03/16/2013 1930      Component Value Date/Time   CALCIUM 9.1 05/24/2013 0845   CALCIUM 8.2* 03/16/2013 1930   ALKPHOS 32* 05/24/2013 0845   ALKPHOS 31* 03/16/2013 1930   AST 12 05/24/2013 0845   AST 11 03/16/2013 1930   ALT 10 05/24/2013 0845   ALT 15 03/16/2013 1930   BILITOT 0.85 05/24/2013 0845   BILITOT 0.2* 03/16/2013 1930       RADIOGRAPHIC STUDIES: No results found.  ASSESSMENT AND PLAN: This is a very pleasant 52 years old [place] American male with recurrent multiple myeloma currently on systemic chemotherapy with Carfilzomib, Cytoxan and dexamethasone status post 1 cycle and tolerated his treatment fairly well. I recommended for the patient to proceed with cycle #2 today as scheduled.  He would come back  for followup visit in 2 weeks for evaluation and management any adverse effect of his chemotherapy. For the chemotherapy-induced anemia, I will arrange for the patient to receive 2 units of PRBCs transfusion in the next few days. He was advised to call immediately if he has any concerning symptoms in the interval.  All questions were answered. The patient knows to call the clinic with any problems, questions or concerns. We can certainly see the patient much sooner if necessary.  I spent 15 minutes counseling the patient face to face. The total time spent in the appointment was 25 minutes.

## 2013-06-07 NOTE — Progress Notes (Signed)
Called for HAR. 

## 2013-06-07 NOTE — Patient Instructions (Addendum)
[facility] Discharge Instructions for Patients Receiving Chemotherapy  Today you received the following chemotherapy agents Kyprolis and Cytoxan.  To help prevent nausea and vomiting after your treatment, we encourage you to take your nausea medication.   If you develop nausea and vomiting that is not controlled by your nausea medication, call the clinic.   BELOW ARE SYMPTOMS THAT SHOULD BE REPORTED IMMEDIATELY:  *FEVER GREATER THAN 100.5 F  *CHILLS WITH OR WITHOUT FEVER  NAUSEA AND VOMITING THAT IS NOT CONTROLLED WITH YOUR NAUSEA MEDICATION  *UNUSUAL SHORTNESS OF BREATH  *UNUSUAL BRUISING OR BLEEDING  TENDERNESS IN MOUTH AND THROAT WITH OR WITHOUT PRESENCE OF ULCERS  *URINARY PROBLEMS  *BOWEL PROBLEMS  UNUSUAL RASH Items with * indicate a potential emergency and should be followed up as soon as possible.  Feel free to call the clinic you have any questions or concerns. The clinic phone number is [phone].    

## 2013-06-07 NOTE — Progress Notes (Signed)
INR = 2.3 on 5 mg/day; 7.5 mg Mon. No bleeding.  Hgb is low today; may be getting transfused this week. Pt seldom eats vit K foods.  He occasionally may eat coleslaw on BBQ sandwich.  I reminded him to avoid drastic changes in vit K intake.  His wife states pt "doesn't even look at greens." INR is at goal.  No dose change necessary. Repeat INR in 2 weeks & see in infusion on 8/4. Ebony Hail, Pharm.D., CPP 06/07/2013@9 :45 AM

## 2013-06-07 NOTE — Patient Instructions (Signed)
Continue chemotherapy today as scheduled.  Follow up visit in 2 weeks. 

## 2013-06-08 ENCOUNTER — Ambulatory Visit (HOSPITAL_BASED_OUTPATIENT_CLINIC_OR_DEPARTMENT_OTHER): Payer: 59

## 2013-06-08 VITALS — BP 123/70 | HR 76 | Temp 98.7°F | Resp 20

## 2013-06-08 DIAGNOSIS — D649 Anemia, unspecified: Secondary | ICD-10-CM

## 2013-06-08 DIAGNOSIS — T451X5A Adverse effect of antineoplastic and immunosuppressive drugs, initial encounter: Secondary | ICD-10-CM

## 2013-06-08 DIAGNOSIS — Z5112 Encounter for antineoplastic immunotherapy: Secondary | ICD-10-CM

## 2013-06-08 DIAGNOSIS — C9000 Multiple myeloma not having achieved remission: Secondary | ICD-10-CM

## 2013-06-08 DIAGNOSIS — C9002 Multiple myeloma in relapse: Secondary | ICD-10-CM

## 2013-06-08 MED ORDER — SODIUM CHLORIDE 0.9 % IJ SOLN
10.0000 mL | INTRAMUSCULAR | Status: DC | PRN
  Administered 2013-06-08: 10 mL
  Filled 2013-06-08: qty 10

## 2013-06-08 MED ORDER — ACETAMINOPHEN 325 MG PO TABS
650.0000 mg | ORAL_TABLET | Freq: Once | ORAL | Status: AC
  Administered 2013-06-08: 650 mg via ORAL

## 2013-06-08 MED ORDER — DEXAMETHASONE SODIUM PHOSPHATE 10 MG/ML IJ SOLN
10.0000 mg | Freq: Once | INTRAMUSCULAR | Status: AC
  Administered 2013-06-08: 10 mg via INTRAVENOUS

## 2013-06-08 MED ORDER — DEXTROSE 5 % IV SOLN
27.0000 mg/m2 | Freq: Once | INTRAVENOUS | Status: AC
  Administered 2013-06-08: 56 mg via INTRAVENOUS
  Filled 2013-06-08: qty 28

## 2013-06-08 MED ORDER — ONDANSETRON 8 MG/50ML IVPB (CHCC)
8.0000 mg | Freq: Once | INTRAVENOUS | Status: AC
  Administered 2013-06-08: 8 mg via INTRAVENOUS

## 2013-06-08 MED ORDER — SODIUM CHLORIDE 0.9 % IV SOLN
Freq: Once | INTRAVENOUS | Status: AC
  Administered 2013-06-08: 08:00:00 via INTRAVENOUS

## 2013-06-08 MED ORDER — DIPHENHYDRAMINE HCL 50 MG/ML IJ SOLN
25.0000 mg | Freq: Once | INTRAMUSCULAR | Status: AC
  Administered 2013-06-08: 25 mg via INTRAVENOUS

## 2013-06-08 MED ORDER — HEPARIN SOD (PORK) LOCK FLUSH 100 UNIT/ML IV SOLN
500.0000 [IU] | Freq: Once | INTRAVENOUS | Status: AC | PRN
  Administered 2013-06-08: 500 [IU]
  Filled 2013-06-08: qty 5

## 2013-06-08 MED ORDER — SODIUM CHLORIDE 0.9 % IV SOLN: Freq: Once | INTRAVENOUS | Status: DC

## 2013-06-08 NOTE — Patient Instructions (Signed)
[facility] Discharge Instructions for Patients Receiving Chemotherapy  Today you received the following chemotherapy agents:  Kyprolis  To help prevent nausea and vomiting after your treatment, we encourage you to take your nausea medicationBlood Transfusion Information WHAT IS A BLOOD TRANSFUSION? A transfusion is the replacement of blood or some of its parts. Blood is made up of multiple cells which provide different functions.  Red blood cells carry oxygen and are used for blood loss replacement.  White blood cells fight against infection.  Platelets control bleeding.  Plasma helps clot blood.  Other blood products are available for specialized needs, such as hemophilia or other clotting disorders. BEFORE THE TRANSFUSION  Who gives blood for transfusions?   You may be able to donate blood to be used at a later date on yourself (autologous donation).  Relatives can be asked to donate blood. This is generally not any safer than if you have received blood from a stranger. The same precautions are taken to ensure safety when a relative's blood is donated.  Healthy volunteers who are fully evaluated to make sure their blood is safe. This is blood bank blood. Transfusion therapy is the safest it has ever been in the practice of medicine. Before blood is taken from a donor, a complete history is taken to make sure that person has no history of diseases nor engages in risky social behavior (examples are intravenous drug use or sexual activity with multiple partners). The donor's travel history is screened to minimize risk of transmitting infections, such as malaria. The donated blood is tested for signs of infectious diseases, such as HIV and hepatitis. The blood is then tested to be sure it is compatible with you in order to minimize the chance of a transfusion reaction. If you or a relative donates blood, this is often done in anticipation of surgery and is not appropriate for  emergency situations. It takes many days to process the donated blood. RISKS AND COMPLICATIONS Although transfusion therapy is very safe and saves many lives, the main dangers of transfusion include:   Getting an infectious disease.  Developing a transfusion reaction. This is an allergic reaction to something in the blood you were given. Every precaution is taken to prevent this. The decision to have a blood transfusion has been considered carefully by your caregiver before blood is given. Blood is not given unless the benefits outweigh the risks. AFTER THE TRANSFUSION  Right after receiving a blood transfusion, you will usually feel much better and more energetic. This is especially true if your red blood cells have gotten low (anemic). The transfusion raises the level of the red blood cells which carry oxygen, and this usually causes an energy increase.  The nurse administering the transfusion will monitor you carefully for complications. HOME CARE INSTRUCTIONS  No special instructions are needed after a transfusion. You may find your energy is better. Speak with your caregiver about any limitations on activity for underlying diseases you may have. SEEK MEDICAL CARE IF:   Your condition is not improving after your transfusion.  You develop redness or irritation at the intravenous (IV) site. SEEK IMMEDIATE MEDICAL CARE IF:  Any of the following symptoms occur over the next 12 hours:  Shaking chills.  You have a temperature by mouth above 102 F (38.9 C), not controlled by medicine.  Chest, back, or muscle pain.  People around you feel you are not acting correctly or are confused.  Shortness of breath or difficulty breathing.  Dizziness and fainting.  You get a rash or develop hives.  You have a decrease in urine output.  Your urine turns a dark color or changes to pink, red, or brown. Any of the following symptoms occur over the next 10 days:  You have a temperature by  mouth above 102 F (38.9 C), not controlled by medicine.  Shortness of breath.  Weakness after normal activity.  The white part of the eye turns yellow (jaundice).  You have a decrease in the amount of urine or are urinating less often.  Your urine turns a dark color or changes to pink, red, or brown. Document Released: 11/01/2000 Document Revised: 01/27/2012 Document Reviewed: 06/20/2008 [facility] Patient Information 2014 [place], [place].    If you develop nausea and vomiting that is not controlled by your nausea medication, call the clinic.   BELOW ARE SYMPTOMS THAT SHOULD BE REPORTED IMMEDIATELY:  *FEVER GREATER THAN 100.5 F  *CHILLS WITH OR WITHOUT FEVER  NAUSEA AND VOMITING THAT IS NOT CONTROLLED WITH YOUR NAUSEA MEDICATION  *UNUSUAL SHORTNESS OF BREATH  *UNUSUAL BRUISING OR BLEEDING  TENDERNESS IN MOUTH AND THROAT WITH OR WITHOUT PRESENCE OF ULCERS  *URINARY PROBLEMS  *BOWEL PROBLEMS  UNUSUAL RASH Items with * indicate a potential emergency and should be followed up as soon as possible.  Feel free to call the clinic you have any questions or concerns. The clinic phone number is [phone].

## 2013-06-09 LAB — TYPE AND SCREEN
Donor AG Type: NEGATIVE
Unit division: 0

## 2013-06-14 ENCOUNTER — Other Ambulatory Visit (HOSPITAL_BASED_OUTPATIENT_CLINIC_OR_DEPARTMENT_OTHER): Payer: 59 | Admitting: Lab

## 2013-06-14 ENCOUNTER — Ambulatory Visit (HOSPITAL_BASED_OUTPATIENT_CLINIC_OR_DEPARTMENT_OTHER): Payer: 59

## 2013-06-14 ENCOUNTER — Other Ambulatory Visit: Payer: Self-pay | Admitting: Physician Assistant

## 2013-06-14 VITALS — BP 158/84 | HR 84 | Temp 97.3°F | Resp 18

## 2013-06-14 DIAGNOSIS — C9000 Multiple myeloma not having achieved remission: Secondary | ICD-10-CM

## 2013-06-14 DIAGNOSIS — C9002 Multiple myeloma in relapse: Secondary | ICD-10-CM

## 2013-06-14 DIAGNOSIS — Z5111 Encounter for antineoplastic chemotherapy: Secondary | ICD-10-CM

## 2013-06-14 DIAGNOSIS — Z5112 Encounter for antineoplastic immunotherapy: Secondary | ICD-10-CM

## 2013-06-14 LAB — COMPREHENSIVE METABOLIC PANEL (CC13)
ALT: 11 U/L (ref 0–55)
AST: 11 U/L (ref 5–34)
Calcium: 8.9 mg/dL (ref 8.4–10.4)
Chloride: 110 mEq/L — ABNORMAL HIGH (ref 98–109)
Creatinine: 0.8 mg/dL (ref 0.7–1.3)
Total Bilirubin: 0.91 mg/dL (ref 0.20–1.20)

## 2013-06-14 LAB — CBC WITH DIFFERENTIAL/PLATELET
BASO%: 0.2 % (ref 0.0–2.0)
EOS%: 0.4 % (ref 0.0–7.0)
HCT: 27.3 % — ABNORMAL LOW (ref 38.4–49.9)
MCH: 30.3 pg (ref 27.2–33.4)
MCHC: 33 g/dL (ref 32.0–36.0)
NEUT%: 68.7 % (ref 39.0–75.0)
RBC: 2.97 10*6/uL — ABNORMAL LOW (ref 4.20–5.82)
lymph#: 0.8 10*3/uL — ABNORMAL LOW (ref 0.9–3.3)

## 2013-06-14 MED ORDER — SODIUM CHLORIDE 0.9 % IJ SOLN
10.0000 mL | INTRAMUSCULAR | Status: DC | PRN
  Administered 2013-06-14: 10 mL
  Filled 2013-06-14: qty 10

## 2013-06-14 MED ORDER — SODIUM CHLORIDE 0.9 % IV SOLN: Freq: Once | INTRAVENOUS | Status: DC

## 2013-06-14 MED ORDER — DEXAMETHASONE SODIUM PHOSPHATE 10 MG/ML IJ SOLN
10.0000 mg | Freq: Once | INTRAMUSCULAR | Status: AC
  Administered 2013-06-14: 10 mg via INTRAVENOUS

## 2013-06-14 MED ORDER — SODIUM CHLORIDE 0.9 % IV SOLN
Freq: Once | INTRAVENOUS | Status: AC
  Administered 2013-06-14: 10:00:00 via INTRAVENOUS

## 2013-06-14 MED ORDER — SODIUM CHLORIDE 0.9 % IV SOLN
300.0000 mg/m2 | Freq: Once | INTRAVENOUS | Status: AC
  Administered 2013-06-14: 640 mg via INTRAVENOUS
  Filled 2013-06-14: qty 32

## 2013-06-14 MED ORDER — ONDANSETRON 8 MG/50ML IVPB (CHCC)
8.0000 mg | Freq: Once | INTRAVENOUS | Status: AC
  Administered 2013-06-14: 8 mg via INTRAVENOUS

## 2013-06-14 MED ORDER — DEXTROSE 5 % IV SOLN
27.0000 mg/m2 | Freq: Once | INTRAVENOUS | Status: AC
  Administered 2013-06-14: 56 mg via INTRAVENOUS
  Filled 2013-06-14: qty 28

## 2013-06-14 MED ORDER — HEPARIN SOD (PORK) LOCK FLUSH 100 UNIT/ML IV SOLN
500.0000 [IU] | Freq: Once | INTRAVENOUS | Status: AC | PRN
  Administered 2013-06-14: 500 [IU]
  Filled 2013-06-14: qty 5

## 2013-06-14 NOTE — Progress Notes (Signed)
1205 Prehydration of 250 cc's of normal saline given prior to Kyprolis.

## 2013-06-14 NOTE — Patient Instructions (Addendum)
[facility] Discharge Instructions for Patients Receiving Chemotherapy  Today you received the following chemotherapy agents Cytoxan and Kyprolis.  To help prevent nausea and vomiting after your treatment, we encourage you to take your nausea medication.   If you develop nausea and vomiting that is not controlled by your nausea medication, call the clinic.   BELOW ARE SYMPTOMS THAT SHOULD BE REPORTED IMMEDIATELY:  *FEVER GREATER THAN 100.5 F  *CHILLS WITH OR WITHOUT FEVER  NAUSEA AND VOMITING THAT IS NOT CONTROLLED WITH YOUR NAUSEA MEDICATION  *UNUSUAL SHORTNESS OF BREATH  *UNUSUAL BRUISING OR BLEEDING  TENDERNESS IN MOUTH AND THROAT WITH OR WITHOUT PRESENCE OF ULCERS  *URINARY PROBLEMS  *BOWEL PROBLEMS  UNUSUAL RASH Items with * indicate a potential emergency and should be followed up as soon as possible.  Feel free to call the clinic you have any questions or concerns. The clinic phone number is [phone].    

## 2013-06-14 NOTE — Progress Notes (Signed)
Ok to treat pt today with chemotherapy and today"s CBC results

## 2013-06-15 ENCOUNTER — Ambulatory Visit (HOSPITAL_BASED_OUTPATIENT_CLINIC_OR_DEPARTMENT_OTHER): Payer: 59

## 2013-06-15 VITALS — BP 155/82 | HR 67 | Temp 97.7°F | Resp 20

## 2013-06-15 DIAGNOSIS — C9002 Multiple myeloma in relapse: Secondary | ICD-10-CM

## 2013-06-15 DIAGNOSIS — J869 Pyothorax without fistula: Secondary | ICD-10-CM

## 2013-06-15 DIAGNOSIS — Z5112 Encounter for antineoplastic immunotherapy: Secondary | ICD-10-CM

## 2013-06-15 DIAGNOSIS — C9000 Multiple myeloma not having achieved remission: Secondary | ICD-10-CM

## 2013-06-15 DIAGNOSIS — G8918 Other acute postprocedural pain: Secondary | ICD-10-CM

## 2013-06-15 MED ORDER — ONDANSETRON 8 MG/50ML IVPB (CHCC)
8.0000 mg | Freq: Once | INTRAVENOUS | Status: AC
  Administered 2013-06-15: 8 mg via INTRAVENOUS

## 2013-06-15 MED ORDER — OXYCODONE-ACETAMINOPHEN 5-325 MG PO TABS: 1.0000 | ORAL_TABLET | Freq: Four times a day (QID) | ORAL | Status: DC | PRN

## 2013-06-15 MED ORDER — SODIUM CHLORIDE 0.9 % IJ SOLN
10.0000 mL | INTRAMUSCULAR | Status: DC | PRN
  Administered 2013-06-15: 10 mL
  Filled 2013-06-15: qty 10

## 2013-06-15 MED ORDER — DEXTROSE 5 % IV SOLN
27.0000 mg/m2 | Freq: Once | INTRAVENOUS | Status: AC
  Administered 2013-06-15: 56 mg via INTRAVENOUS
  Filled 2013-06-15: qty 28

## 2013-06-15 MED ORDER — SODIUM CHLORIDE 0.9 % IV SOLN
Freq: Once | INTRAVENOUS | Status: AC
  Administered 2013-06-15: 08:00:00 via INTRAVENOUS

## 2013-06-15 MED ORDER — HEPARIN SOD (PORK) LOCK FLUSH 100 UNIT/ML IV SOLN
500.0000 [IU] | Freq: Once | INTRAVENOUS | Status: AC | PRN
  Administered 2013-06-15: 500 [IU]
  Filled 2013-06-15: qty 5

## 2013-06-15 MED ORDER — SODIUM CHLORIDE 0.9 % IV SOLN: Freq: Once | INTRAVENOUS | Status: DC

## 2013-06-15 MED ORDER — DEXAMETHASONE SODIUM PHOSPHATE 10 MG/ML IJ SOLN
10.0000 mg | Freq: Once | INTRAMUSCULAR | Status: AC
  Administered 2013-06-15: 10 mg via INTRAVENOUS

## 2013-06-15 NOTE — Progress Notes (Signed)
250 cc NS given.

## 2013-06-15 NOTE — Patient Instructions (Signed)
[name] Discharge Instructions for Patients Receiving Chemotherapy  Today you received the following chemotherapy agents Kyprolis  To help prevent nausea and vomiting after your treatment, we encourage you to take your nausea medication as needed   If you develop nausea and vomiting that is not controlled by your nausea medication, call the clinic.   BELOW ARE SYMPTOMS THAT SHOULD BE REPORTED IMMEDIATELY:  *FEVER GREATER THAN 100.5 F  *CHILLS WITH OR WITHOUT FEVER  NAUSEA AND VOMITING THAT IS NOT CONTROLLED WITH YOUR NAUSEA MEDICATION  *UNUSUAL SHORTNESS OF BREATH  *UNUSUAL BRUISING OR BLEEDING  TENDERNESS IN MOUTH AND THROAT WITH OR WITHOUT PRESENCE OF ULCERS  *URINARY PROBLEMS  *BOWEL PROBLEMS  UNUSUAL RASH Items with * indicate a potential emergency and should be followed up as soon as possible.  Feel free to call the clinic you have any questions or concerns. The clinic phone number is [phone].    

## 2013-06-21 ENCOUNTER — Ambulatory Visit (HOSPITAL_BASED_OUTPATIENT_CLINIC_OR_DEPARTMENT_OTHER): Payer: 59 | Admitting: Pharmacist

## 2013-06-21 ENCOUNTER — Ambulatory Visit (HOSPITAL_BASED_OUTPATIENT_CLINIC_OR_DEPARTMENT_OTHER): Payer: 59

## 2013-06-21 ENCOUNTER — Encounter: Payer: Self-pay | Admitting: Internal Medicine

## 2013-06-21 ENCOUNTER — Telehealth: Payer: Self-pay | Admitting: Internal Medicine

## 2013-06-21 ENCOUNTER — Other Ambulatory Visit (HOSPITAL_BASED_OUTPATIENT_CLINIC_OR_DEPARTMENT_OTHER): Payer: 59 | Admitting: Lab

## 2013-06-21 ENCOUNTER — Other Ambulatory Visit: Payer: 59 | Admitting: Lab

## 2013-06-21 ENCOUNTER — Ambulatory Visit (HOSPITAL_BASED_OUTPATIENT_CLINIC_OR_DEPARTMENT_OTHER): Payer: 59 | Admitting: Internal Medicine

## 2013-06-21 VITALS — BP 142/81 | HR 80 | Temp 98.4°F | Resp 18 | Ht 63.0 in | Wt 224.5 lb

## 2013-06-21 DIAGNOSIS — I776 Arteritis, unspecified: Secondary | ICD-10-CM

## 2013-06-21 DIAGNOSIS — C9002 Multiple myeloma in relapse: Secondary | ICD-10-CM

## 2013-06-21 DIAGNOSIS — C9000 Multiple myeloma not having achieved remission: Secondary | ICD-10-CM

## 2013-06-21 DIAGNOSIS — R05 Cough: Secondary | ICD-10-CM

## 2013-06-21 DIAGNOSIS — J40 Bronchitis, not specified as acute or chronic: Secondary | ICD-10-CM

## 2013-06-21 DIAGNOSIS — Z5112 Encounter for antineoplastic immunotherapy: Secondary | ICD-10-CM

## 2013-06-21 LAB — CBC WITH DIFFERENTIAL/PLATELET
Eosinophils Absolute: 0.1 10*3/uL (ref 0.0–0.5)
MONO#: 0.4 10*3/uL (ref 0.1–0.9)
NEUT#: 2.9 10*3/uL (ref 1.5–6.5)
RBC: 2.89 10*6/uL — ABNORMAL LOW (ref 4.20–5.82)
RDW: 21.1 % — ABNORMAL HIGH (ref 11.0–14.6)
WBC: 4.1 10*3/uL (ref 4.0–10.3)
nRBC: 3 % — ABNORMAL HIGH (ref 0–0)

## 2013-06-21 LAB — COMPREHENSIVE METABOLIC PANEL (CC13)
ALT: 10 U/L (ref 0–55)
CO2: 22 mEq/L (ref 22–29)
Calcium: 8.9 mg/dL (ref 8.4–10.4)
Chloride: 110 mEq/L — ABNORMAL HIGH (ref 98–109)
Sodium: 139 mEq/L (ref 136–145)
Total Bilirubin: 0.82 mg/dL (ref 0.20–1.20)
Total Protein: 10 g/dL — ABNORMAL HIGH (ref 6.4–8.3)

## 2013-06-21 LAB — PROTIME-INR
INR: 1.3 — ABNORMAL LOW (ref 2.00–3.50)
Protime: 15.6 Seconds — ABNORMAL HIGH (ref 10.6–13.4)

## 2013-06-21 LAB — TECHNOLOGIST REVIEW

## 2013-06-21 MED ORDER — DEXAMETHASONE SODIUM PHOSPHATE 10 MG/ML IJ SOLN
10.0000 mg | Freq: Once | INTRAMUSCULAR | Status: AC
  Administered 2013-06-21: 10 mg via INTRAVENOUS

## 2013-06-21 MED ORDER — HEPARIN SOD (PORK) LOCK FLUSH 100 UNIT/ML IV SOLN
500.0000 [IU] | Freq: Once | INTRAVENOUS | Status: AC | PRN
  Administered 2013-06-21: 500 [IU]
  Filled 2013-06-21: qty 5

## 2013-06-21 MED ORDER — DEXAMETHASONE 4 MG PO TABS
40.0000 mg | ORAL_TABLET | Freq: Once | ORAL | Status: AC
  Administered 2013-06-21: 40 mg via ORAL

## 2013-06-21 MED ORDER — SODIUM CHLORIDE 0.9 % IV SOLN
300.0000 mg/m2 | Freq: Once | INTRAVENOUS | Status: AC
  Administered 2013-06-21: 640 mg via INTRAVENOUS
  Filled 2013-06-21: qty 32

## 2013-06-21 MED ORDER — SODIUM CHLORIDE 0.9 % IV SOLN
Freq: Once | INTRAVENOUS | Status: AC
  Administered 2013-06-21: 10:00:00 via INTRAVENOUS

## 2013-06-21 MED ORDER — SODIUM CHLORIDE 0.9 % IJ SOLN
10.0000 mL | INTRAMUSCULAR | Status: DC | PRN
  Administered 2013-06-21: 10 mL
  Filled 2013-06-21: qty 10

## 2013-06-21 MED ORDER — CARFILZOMIB CHEMO INJECTION 60 MG
27.0000 mg/m2 | Freq: Once | INTRAVENOUS | Status: AC
  Administered 2013-06-21: 56 mg via INTRAVENOUS
  Filled 2013-06-21: qty 28

## 2013-06-21 MED ORDER — ONDANSETRON 8 MG/50ML IVPB (CHCC)
8.0000 mg | Freq: Once | INTRAVENOUS | Status: AC
  Administered 2013-06-21: 8 mg via INTRAVENOUS

## 2013-06-21 MED ORDER — LEVOFLOXACIN 500 MG PO TABS: 500.0000 mg | ORAL_TABLET | Freq: Every day | ORAL | Status: DC

## 2013-06-21 NOTE — Progress Notes (Signed)
Pt seen during infusion today. INR=1.3 after missed doses No changes to report other than missed doses.  Continue Coumadin 5mg  daily except 7.5 mg on Mondays. Will recheck PT/INR on 07/05/13- see pt in infusion at 10:45

## 2013-06-21 NOTE — Telephone Encounter (Signed)
gv pt appt schedule for August.  °

## 2013-06-21 NOTE — Progress Notes (Signed)
[facility] [phone]   [phone]  OFFICE PROGRESS NOTE  [name], MD [address] [place] [address]  Principle Diagnosis:  #1 recurrent multiple myeloma IgG kappa subtype diagnosed in June of 2008  #2 history of vasculitis and thrombosis of skin lesions   Prior Therapy:  #1 status post palliative radiotherapy to the left hip under the care of Dr. Roselind Messier  #2 status post 5 cycles of systemic chemotherapy with Revlimid and low dose Decadron. Last dose given June 2009 with good response.  #3 status post autologous peripheral blood stem cell transplant at [facility] on 02/02/2008.  #4 the patient had evidence for disease recurrence in December 2010.  #5 Revlimid 25 mg by mouth daily for 21 days every 4 weeks in addition to Decadron 40 mg orally on a weekly basis. The patient is status post 28 cycles, discontinued today secondary to disease progression.  #6 Systemic chemotherapy with Velcade 1.3 mg/M2 on days 1, 4, 8 and 11 in addition to Doxil 30 mg/M2 on day 4 and Decadron 40 mg by mouth on weekly basis every 3 weeks. Status post 3 cycles, last dose was given 05/04/2012 discontinued secondary to intolerance.  #7 Salvage therapy treatment with the Pomalyst 4 mg by mouth daily for 21 days every 28 days as well as Cytoxan 50 mg by mouth every other day and dexamethasone 40 mg by mouth once weekly. Therapy beginning 09/25/2012, status post 2 cycles discontinued secondary to disease progression and intolerance.  #8 Kyprolis (Carfilzomib) 27 mg/M2 on days 1, 2, 8, 9, 15 and 16 every 4 weeks. First dose on 02/08/2013. This would be concurrent with the dexamethasone 40 mg by mouth on a weekly basis. Status post 3 cycles with disease progression.   Current therapy:  1) Systemic chemotherapy with Carfilzomib 27 mg/M2 days 1, , 8, 9, 15 and 16, Cytoxan 300 mg/M2 and dexamethasone 40 mg by mouth weekly every 4 weeks. Status  post 1 cycle and currently undergoing cycle #2.  2) Zometa 4 mg IV given every 3 months.   INTERVAL HISTORY: [name] 52 y.o. male returns to the clinic today for follow up visit accompanied his wife. The patient is feeling fine today with no specific complaints except for mild cough productive of yellowish sputum. He denied having any fever or chills. The patient denied having any significant chest pain, shortness breath or hemoptysis. He has no weight loss or night sweats. He is tolerating his treatment with Carfilzomib, Decadron and Cytoxan fairly well.[name] He is currently undergoing cycle #2. He has no nausea or vomiting.   MEDICAL HISTORY: Past Medical History  Diagnosis Date  . Multiple myeloma 09/24/2011  . Hypertension     ALLERGIES:  is allergic to red dye and other.  MEDICATIONS:  Current Outpatient Prescriptions  Medication Sig Dispense Refill  . amLODipine (NORVASC) 10 MG tablet Take 10 mg by mouth daily.      . benzonatate (TESSALON) 200 MG capsule Take 200 mg by mouth 3 (three) times daily as needed.       [name] dexamethasone (DECADRON) 4 MG tablet Take 10 tablets (40 mg total) by mouth once a week. Takes 40 mg Q Monday  40 tablet  2  . HYDROcodone-acetaminophen (NORCO) 10-325 MG per tablet Take 1-2 tablets by mouth every 6 (six) hours as needed for pain. For pain.  40 tablet  0  . nebivolol (BYSTOLIC) 5 MG tablet  Take 5 mg by mouth daily.      [name] omeprazole (PRILOSEC) 40 MG capsule Take 40 mg by mouth daily as needed (for heartburn).       [name] oxyCODONE-acetaminophen (PERCOCET/ROXICET) 5-325 MG per tablet Take 1 tablet by mouth every 6 (six) hours as needed for pain.  40 tablet  0  . pregabalin (LYRICA) 50 MG capsule Take 1 capsule (50 mg total) by mouth 3 (three) times daily.  90 capsule  2  . warfarin (COUMADIN) 5 MG tablet TAKE 7.5MG (1 AND 1/2) DAILY EXCEPT 5MG (1 TAB) ON TUES, THURS, AND SAT, OR AS DIRECTED  40 tablet  2   No current facility-administered medications for  this visit.    SURGICAL HISTORY:  Past Surgical History  Procedure Laterality Date  . Video bronchoscopy  12/11/2012    Procedure: VIDEO BRONCHOSCOPY;  Surgeon: Kerin Perna, MD;  Location: [facility] OR;  Service: Thoracic;  Laterality: N/A;  . Video assisted thoracoscopy  12/11/2012    Procedure: VIDEO ASSISTED THORACOSCOPY;  Surgeon: Kerin Perna, MD;  Location: [facility] OR;  Service: Thoracic;  Laterality: Right;  . Decortication  12/11/2012    Procedure: DECORTICATION;  Surgeon: Kerin Perna, MD;  Location: [facility] OR;  Service: Thoracic;  Laterality: N/A;    REVIEW OF SYSTEMS:  A comprehensive review of systems was negative except for: Constitutional: positive for fatigue Respiratory: positive for cough   PHYSICAL EXAMINATION: General appearance: alert, cooperative, fatigued and no distress Head: Normocephalic, without obvious abnormality, atraumatic Neck: no adenopathy Lymph nodes: Cervical, supraclavicular, and axillary nodes normal. Resp: clear to auscultation bilaterally Cardio: regular rate and rhythm, S1, S2 normal, no murmur, click, rub or gallop GI: soft, non-tender; bowel sounds normal; no masses,  no organomegaly Extremities: extremities normal, atraumatic, no cyanosis or edema Neurologic: Alert and oriented X 3, normal strength and tone. Normal symmetric reflexes. Normal coordination and gait  ECOG PERFORMANCE STATUS: 1 - Symptomatic but completely ambulatory  Blood pressure 142/81, pulse 80, temperature 98.4 F (36.9 C), temperature source Oral, resp. rate 18, height 5\' 3"  (1.6 m), weight 224 lb 8 oz (101.833 kg).  LABORATORY DATA: Lab Results  Component Value Date   WBC 4.1 06/21/2013   HGB 8.7* 06/21/2013   HCT 26.6* 06/21/2013   MCV 92.0 06/21/2013   PLT 79* 06/21/2013      Chemistry      Component Value Date/Time   NA 138 06/14/2013 0902   NA 136 03/16/2013 1930   K 3.4* 06/14/2013 0902   K 3.8 03/16/2013 1930   CL 112* 05/10/2013 0849   CL 110 03/16/2013 1930   CO2 22  06/14/2013 0902   CO2 16* 03/16/2013 1930   BUN 9.0 06/14/2013 0902   BUN 22 03/16/2013 1930   CREATININE 0.8 06/14/2013 0902   CREATININE 1.24 03/16/2013 1930      Component Value Date/Time   CALCIUM 8.9 06/14/2013 0902   CALCIUM 8.2* 03/16/2013 1930   ALKPHOS 31* 06/14/2013 0902   ALKPHOS 31* 03/16/2013 1930   AST 11 06/14/2013 0902   AST 11 03/16/2013 1930   ALT 11 06/14/2013 0902   ALT 15 03/16/2013 1930   BILITOT 0.91 06/14/2013 0902   BILITOT 0.2* 03/16/2013 1930       RADIOGRAPHIC STUDIES: No results found.  ASSESSMENT AND PLAN: this is a very pleasant 52 years old [place] American male with recurrent multiple myeloma currently on treatment with Carfilzomib, Decadron and Cytoxan, currently undergoing cycle #2. He is rating his treatment  fairly well. We'll continue with the remaining part of cycle #2 today as scheduled. The patient would come back for followup visit in 2 weeks with repeat myeloma panel. For the productive cough and bronchitis, I will start the patient on Levaquin 500 mg by mouth daily for 10 days He was advised to call immediately if he has no significant improvement in his condition. The patient voices understanding of current disease status and treatment options and is in agreement with the current care plan.  All questions were answered. The patient knows to call the clinic with any problems, questions or concerns. We can certainly see the patient much sooner if necessary.  I spent 15 minutes counseling the patient face to face. The total time spent in the appointment was 25 minutes.

## 2013-06-21 NOTE — Patient Instructions (Signed)
Continue Coumadin 5mg  daily except 7.5 mg on Mondays. Will recheck PT/INR on 07/05/13- see pt in infusion

## 2013-06-21 NOTE — Progress Notes (Signed)
OK to treat with platelets 79 per Dr. Arbutus Ped.  TKF

## 2013-06-21 NOTE — Patient Instructions (Addendum)
[facility] Discharge Instructions for Patients Receiving Chemotherapy  Today you received the following chemotherapy agents:  Cytoxan, kyprolis  To help prevent nausea and vomiting after your treatment, we encourage you to take your nausea medication.  Take it as often as prescribed.     If you develop nausea and vomiting that is not controlled by your nausea medication, call the clinic. If it is after clinic hours your family physician or the after hours number for the clinic or go to the Emergency Department.   BELOW ARE SYMPTOMS THAT SHOULD BE REPORTED IMMEDIATELY:  *FEVER GREATER THAN 100.5 F  *CHILLS WITH OR WITHOUT FEVER  NAUSEA AND VOMITING THAT IS NOT CONTROLLED WITH YOUR NAUSEA MEDICATION  *UNUSUAL SHORTNESS OF BREATH  *UNUSUAL BRUISING OR BLEEDING  TENDERNESS IN MOUTH AND THROAT WITH OR WITHOUT PRESENCE OF ULCERS  *URINARY PROBLEMS  *BOWEL PROBLEMS  UNUSUAL RASH Items with * indicate a potential emergency and should be followed up as soon as possible.  Feel free to call the clinic you have any questions or concerns. The clinic phone number is [phone].   I have been informed and understand all the instructions given to me. I know to contact the clinic, my physician, or go to the Emergency Department if any problems should occur. I do not have any questions at this time, but understand that I may call the clinic during office hours   should I have any questions or need assistance in obtaining follow up care.    __________________________________________  _____________  __________ Signature of Patient or Authorized Representative            Date                   Time    __________________________________________ Nurse's Signature

## 2013-06-21 NOTE — Patient Instructions (Signed)
Continue chemotherapy today as scheduled. Follow up visit in 2 weeks with repeat myeloma panel. Levaquin for the questionable bronchitis

## 2013-06-22 ENCOUNTER — Ambulatory Visit (HOSPITAL_BASED_OUTPATIENT_CLINIC_OR_DEPARTMENT_OTHER): Payer: 59

## 2013-06-22 VITALS — BP 159/84 | HR 64 | Temp 97.5°F | Resp 20

## 2013-06-22 DIAGNOSIS — C9000 Multiple myeloma not having achieved remission: Secondary | ICD-10-CM

## 2013-06-22 DIAGNOSIS — C9002 Multiple myeloma in relapse: Secondary | ICD-10-CM

## 2013-06-22 DIAGNOSIS — Z5112 Encounter for antineoplastic immunotherapy: Secondary | ICD-10-CM

## 2013-06-22 MED ORDER — SODIUM CHLORIDE 0.9 % IV SOLN
Freq: Once | INTRAVENOUS | Status: AC
  Administered 2013-06-22: 09:00:00 via INTRAVENOUS

## 2013-06-22 MED ORDER — DEXAMETHASONE SODIUM PHOSPHATE 10 MG/ML IJ SOLN
10.0000 mg | Freq: Once | INTRAMUSCULAR | Status: AC
  Administered 2013-06-22: 10 mg via INTRAVENOUS

## 2013-06-22 MED ORDER — DEXTROSE 5 % IV SOLN
27.0000 mg/m2 | Freq: Once | INTRAVENOUS | Status: AC
  Administered 2013-06-22: 56 mg via INTRAVENOUS
  Filled 2013-06-22: qty 28

## 2013-06-22 MED ORDER — SODIUM CHLORIDE 0.9 % IJ SOLN
10.0000 mL | INTRAMUSCULAR | Status: DC | PRN
  Administered 2013-06-22: 10 mL
  Filled 2013-06-22: qty 10

## 2013-06-22 MED ORDER — HEPARIN SOD (PORK) LOCK FLUSH 100 UNIT/ML IV SOLN
500.0000 [IU] | Freq: Once | INTRAVENOUS | Status: AC | PRN
  Administered 2013-06-22: 500 [IU]
  Filled 2013-06-22: qty 5

## 2013-06-22 MED ORDER — ONDANSETRON 8 MG/50ML IVPB (CHCC)
8.0000 mg | Freq: Once | INTRAVENOUS | Status: AC
  Administered 2013-06-22: 8 mg via INTRAVENOUS

## 2013-06-22 NOTE — Patient Instructions (Signed)
[facility] Center Discharge Instructions for Patients Receiving Chemotherapy  Today you received the following chemotherapy agents KYPROLIS To help prevent nausea and vomiting after your treatment, we encourage you to take your nausea medication.   If you develop nausea and vomiting that is not controlled by your nausea medication, call the clinic.   BELOW ARE SYMPTOMS THAT SHOULD BE REPORTED IMMEDIATELY:  *FEVER GREATER THAN 100.5 F  *CHILLS WITH OR WITHOUT FEVER  NAUSEA AND VOMITING THAT IS NOT CONTROLLED WITH YOUR NAUSEA MEDICATION  *UNUSUAL SHORTNESS OF BREATH  *UNUSUAL BRUISING OR BLEEDING  TENDERNESS IN MOUTH AND THROAT WITH OR WITHOUT PRESENCE OF ULCERS  *URINARY PROBLEMS  *BOWEL PROBLEMS  UNUSUAL RASH Items with * indicate a potential emergency and should be followed up as soon as possible.  Feel free to call the clinic you have any questions or concerns. The clinic phone number is [phone].    

## 2013-06-28 ENCOUNTER — Other Ambulatory Visit (HOSPITAL_BASED_OUTPATIENT_CLINIC_OR_DEPARTMENT_OTHER): Payer: 59

## 2013-06-28 DIAGNOSIS — C9002 Multiple myeloma in relapse: Secondary | ICD-10-CM

## 2013-06-28 LAB — COMPREHENSIVE METABOLIC PANEL (CC13)
ALT: 8 U/L (ref 0–55)
CO2: 21 mEq/L — ABNORMAL LOW (ref 22–29)
Potassium: 3.4 mEq/L — ABNORMAL LOW (ref 3.5–5.1)
Sodium: 140 mEq/L (ref 136–145)
Total Bilirubin: 0.66 mg/dL (ref 0.20–1.20)
Total Protein: 9.7 g/dL — ABNORMAL HIGH (ref 6.4–8.3)

## 2013-06-28 LAB — CBC WITH DIFFERENTIAL/PLATELET
Eosinophils Absolute: 0 10*3/uL (ref 0.0–0.5)
MCV: 93.4 fL (ref 79.3–98.0)
MONO#: 0.4 10*3/uL (ref 0.1–0.9)
MONO%: 9.2 % (ref 0.0–14.0)
NEUT#: 3.2 10*3/uL (ref 1.5–6.5)
RBC: 2.71 10*6/uL — ABNORMAL LOW (ref 4.20–5.82)
RDW: 21.6 % — ABNORMAL HIGH (ref 11.0–14.6)
WBC: 4.7 10*3/uL (ref 4.0–10.3)
lymph#: 1 10*3/uL (ref 0.9–3.3)
nRBC: 3 % — ABNORMAL HIGH (ref 0–0)

## 2013-06-28 LAB — TECHNOLOGIST REVIEW

## 2013-07-05 ENCOUNTER — Other Ambulatory Visit (HOSPITAL_BASED_OUTPATIENT_CLINIC_OR_DEPARTMENT_OTHER): Payer: 59

## 2013-07-05 ENCOUNTER — Other Ambulatory Visit: Payer: 59 | Admitting: Lab

## 2013-07-05 ENCOUNTER — Telehealth: Payer: Self-pay | Admitting: *Deleted

## 2013-07-05 ENCOUNTER — Ambulatory Visit (HOSPITAL_COMMUNITY)
Admission: RE | Admit: 2013-07-05 | Discharge: 2013-07-05 | Disposition: A | Discharge status: Home / Self Care | Payer: 59 | Source: Ambulatory Visit | Attending: Internal Medicine | Admitting: Internal Medicine

## 2013-07-05 ENCOUNTER — Encounter: Payer: Self-pay | Admitting: Internal Medicine

## 2013-07-05 ENCOUNTER — Ambulatory Visit (HOSPITAL_BASED_OUTPATIENT_CLINIC_OR_DEPARTMENT_OTHER): Payer: 59 | Admitting: Internal Medicine

## 2013-07-05 ENCOUNTER — Ambulatory Visit: Payer: 59

## 2013-07-05 ENCOUNTER — Encounter (HOSPITAL_COMMUNITY)
Admission: RE | Admit: 2013-07-05 | Discharge: 2013-07-05 | Disposition: A | Discharge status: Home / Self Care | Payer: 59 | Source: Ambulatory Visit | Attending: Internal Medicine | Admitting: Internal Medicine

## 2013-07-05 ENCOUNTER — Ambulatory Visit (HOSPITAL_BASED_OUTPATIENT_CLINIC_OR_DEPARTMENT_OTHER): Payer: 59 | Admitting: Pharmacist

## 2013-07-05 ENCOUNTER — Telehealth: Payer: Self-pay | Admitting: Internal Medicine

## 2013-07-05 VITALS — BP 138/63 | HR 86 | Temp 98.8°F | Resp 20 | Ht 63.0 in | Wt 223.9 lb

## 2013-07-05 DIAGNOSIS — T451X5A Adverse effect of antineoplastic and immunosuppressive drugs, initial encounter: Secondary | ICD-10-CM | POA: Insufficient documentation

## 2013-07-05 DIAGNOSIS — C9000 Multiple myeloma not having achieved remission: Secondary | ICD-10-CM | POA: Insufficient documentation

## 2013-07-05 DIAGNOSIS — C9002 Multiple myeloma in relapse: Secondary | ICD-10-CM

## 2013-07-05 DIAGNOSIS — R5381 Other malaise: Secondary | ICD-10-CM

## 2013-07-05 DIAGNOSIS — D6481 Anemia due to antineoplastic chemotherapy: Secondary | ICD-10-CM

## 2013-07-05 DIAGNOSIS — I776 Arteritis, unspecified: Secondary | ICD-10-CM

## 2013-07-05 DIAGNOSIS — R059 Cough, unspecified: Secondary | ICD-10-CM | POA: Insufficient documentation

## 2013-07-05 DIAGNOSIS — R05 Cough: Secondary | ICD-10-CM | POA: Insufficient documentation

## 2013-07-05 LAB — CBC WITH DIFFERENTIAL/PLATELET
BASO%: 0.3 % (ref 0.0–2.0)
EOS%: 0.7 % (ref 0.0–7.0)
HCT: 23.6 % — ABNORMAL LOW (ref 38.4–49.9)
LYMPH%: 21.6 % (ref 14.0–49.0)
MCH: 30.6 pg (ref 27.2–33.4)
MCHC: 31.8 g/dL — ABNORMAL LOW (ref 32.0–36.0)
NEUT%: 63.8 % (ref 39.0–75.0)
Platelets: 82 10*3/uL — ABNORMAL LOW (ref 140–400)
lymph#: 0.6 10*3/uL — ABNORMAL LOW (ref 0.9–3.3)

## 2013-07-05 LAB — PROTIME-INR: INR: 1.5 — ABNORMAL LOW (ref 2.00–3.50)

## 2013-07-05 LAB — COMPREHENSIVE METABOLIC PANEL (CC13)
AST: 18 U/L (ref 5–34)
Alkaline Phosphatase: 30 U/L — ABNORMAL LOW (ref 40–150)
BUN: 8.1 mg/dL (ref 7.0–26.0)
Creatinine: 0.9 mg/dL (ref 0.7–1.3)
Total Bilirubin: 0.67 mg/dL (ref 0.20–1.20)

## 2013-07-05 LAB — POCT INR: INR: 1.5

## 2013-07-05 NOTE — Telephone Encounter (Signed)
Per staff phone call and POF I have schedueld appts.  [name]  

## 2013-07-05 NOTE — Progress Notes (Addendum)
INR below goal today. No problems to report regarding anticoagulation. No s/s of clotting. WBC = 2.9,  Hg = 7.5, HCt = 23.6, pltc = 82 Chemotherapy being held today. Pt has continued coughing and had a fever yesterday. No fever today. Pt for chest x-ray today. Pt received Levaquin for 10days starting 06/21/13 for bronchitis/coughing. Levaquin completed ~ 07/02/13. No other changes to report. No missed coumadin doses. Diet is consistent - unchanged. Coumadin 7.5mg  tomorrow (07/06/13). On 07/07/13, continue Coumadin 5mg  daily except 7.5 mg on Mondays.   Will recheck PT/INR on 07/12/13 Will f/u chest x-ray results today and evaluate for antibiotic prescription or possible drug interaction with coumadin and adjust coumadin dose as necessary.

## 2013-07-05 NOTE — Patient Instructions (Signed)
We will delay the start of cycle #3 until next week. Followup visit next week for reevaluation. PRBCs transfusion in the next few days.

## 2013-07-05 NOTE — Telephone Encounter (Signed)
gv and printed appt sched and avs for pt MW added tx. AJ only had late after appts on 8.25.14

## 2013-07-05 NOTE — Patient Instructions (Signed)
Take Coumadin 7.5mg  tomorrow (07/06/13).  On 07/07/13, continue Coumadin 5mg  daily except 7.5 mg on Mondays.  Will recheck PT/INR on 07/12/13- the pharmacist will see you in the infusion area.

## 2013-07-05 NOTE — Progress Notes (Signed)
[facility] Telephone:([phone]) [phone]   Fax:(336) [phone]  OFFICE PROGRESS NOTE  [name], MD [address] [place] [address]  Principle Diagnosis:  #1 recurrent multiple myeloma IgG kappa subtype diagnosed in June of 2008  #2 history of vasculitis and thrombosis of skin lesions   Prior Therapy:  #1 status post palliative radiotherapy to the left hip under the care of Dr. Roselind Messier  #2 status post 5 cycles of systemic chemotherapy with Revlimid and low dose Decadron. Last dose given June 2009 with good response.  #3 status post autologous peripheral blood stem cell transplant at [facility] on 02/02/2008.  #4 the patient had evidence for disease recurrence in December 2010.  #5 Revlimid 25 mg by mouth daily for 21 days every 4 weeks in addition to Decadron 40 mg orally on a weekly basis. The patient is status post 28 cycles, discontinued today secondary to disease progression.  #6 Systemic chemotherapy with Velcade 1.3 mg/M2 on days 1, 4, 8 and 11 in addition to Doxil 30 mg/M2 on day 4 and Decadron 40 mg by mouth on weekly basis every 3 weeks. Status post 3 cycles, last dose was given 05/04/2012 discontinued secondary to intolerance.  #7 Salvage therapy treatment with the Pomalyst 4 mg by mouth daily for 21 days every 28 days as well as Cytoxan 50 mg by mouth every other day and dexamethasone 40 mg by mouth once weekly. Therapy beginning 09/25/2012, status post 2 cycles discontinued secondary to disease progression and intolerance.  #8 Kyprolis (Carfilzomib) 27 mg/M2 on days 1, 2, 8, 9, 15 and 16 every 4 weeks. First dose on 02/08/2013. This would be concurrent with the dexamethasone 40 mg by mouth on a weekly basis. Status post 3 cycles with disease progression.   Current therapy:  1) Systemic chemotherapy with Carfilzomib 27 mg/M2 days 1, , 8, 9, 15 and 16, Cytoxan 300 mg/M2 and dexamethasone 40 mg by mouth weekly every 4 weeks. Status  post 2 cycles.  2) Zometa 4 mg IV given every 3 months.   INTERVAL HISTORY: [name] 52 y.o. male returns to the clinic today for followup visit accompanied by his wife. The patient is feeling better today but yesterday he has fever up to 101 with increasing fatigue and weakness. He did not call for any medical advice or go to the emergency room. He is feeling a little bit better today. He recently completed a course of Levaquin 500 mg by mouth daily for 10 days. He continues to have mild cough with occasional wheezes. He denied having any significant chest pain, shortness breath or hemoptysis. He had repeat CBC, comprehensive metabolic panel and myeloma panel performed earlier today and he is here for evaluation and discussion of his lab results. He has no current fever or chills. He denied having any significant weight loss or night sweats.  MEDICAL HISTORY: Past Medical History  Diagnosis Date  . Multiple myeloma 09/24/2011  . Hypertension     ALLERGIES:  is allergic to red dye and other.  MEDICATIONS:  Current Outpatient Prescriptions  Medication Sig Dispense Refill  . amLODipine (NORVASC) 10 MG tablet Take 10 mg by mouth daily.      . benzonatate (TESSALON) 200 MG capsule Take 200 mg by mouth 3 (three) times daily as needed.       [name] dexamethasone (DECADRON) 4 MG tablet Take 10 tablets (40 mg total) by mouth once a week. Takes 40  mg Q Monday  40 tablet  2  . HYDROcodone-acetaminophen (NORCO) 10-325 MG per tablet Take 1-2 tablets by mouth every 6 (six) hours as needed for pain. For pain.  40 tablet  0  . nebivolol (BYSTOLIC) 5 MG tablet Take 5 mg by mouth daily.      [name] omeprazole (PRILOSEC) 40 MG capsule Take 40 mg by mouth daily as needed (for heartburn).       [name] oxyCODONE-acetaminophen (PERCOCET/ROXICET) 5-325 MG per tablet Take 1 tablet by mouth every 6 (six) hours as needed for pain.  40 tablet  0  . pregabalin (LYRICA) 50 MG capsule Take 1 capsule (50 mg total) by mouth 3  (three) times daily.  90 capsule  2  . warfarin (COUMADIN) 5 MG tablet TAKE 7.5MG (1 AND 1/2) DAILY EXCEPT 5MG (1 TAB) ON TUES, THURS, AND SAT, OR AS DIRECTED  40 tablet  2   No current facility-administered medications for this visit.    SURGICAL HISTORY:  Past Surgical History  Procedure Laterality Date  . Video bronchoscopy  12/11/2012    Procedure: VIDEO BRONCHOSCOPY;  Surgeon: Kerin Perna, MD;  Location: [facility] OR;  Service: Thoracic;  Laterality: N/A;  . Video assisted thoracoscopy  12/11/2012    Procedure: VIDEO ASSISTED THORACOSCOPY;  Surgeon: Kerin Perna, MD;  Location: [facility] OR;  Service: Thoracic;  Laterality: Right;  . Decortication  12/11/2012    Procedure: DECORTICATION;  Surgeon: Kerin Perna, MD;  Location: [facility] OR;  Service: Thoracic;  Laterality: N/A;    REVIEW OF SYSTEMS:  A comprehensive review of systems was negative except for: Constitutional: positive for fatigue   PHYSICAL EXAMINATION: General appearance: alert, cooperative, fatigued and no distress Head: Normocephalic, without obvious abnormality, atraumatic Neck: no adenopathy Lymph nodes: Cervical, supraclavicular, and axillary nodes normal. Resp: clear to auscultation bilaterally Cardio: regular rate and rhythm, S1, S2 normal, no murmur, click, rub or gallop GI: soft, non-tender; bowel sounds normal; no masses,  no organomegaly Extremities: extremities normal, atraumatic, no cyanosis or edema Neurologic: Alert and oriented X 3, normal strength and tone. Normal symmetric reflexes. Normal coordination and gait  ECOG PERFORMANCE STATUS: 1 - Symptomatic but completely ambulatory  Blood pressure 138/63, pulse 86, temperature 98.8 F (37.1 C), temperature source Oral, resp. rate 20, height 5\' 3"  (1.6 m), weight 223 lb 14.4 oz (101.56 kg).  LABORATORY DATA: Lab Results  Component Value Date   WBC 2.9* 07/05/2013   HGB 7.5* 07/05/2013   HCT 23.6* 07/05/2013   MCV 96.3 07/05/2013   PLT 82* 07/05/2013       Chemistry      Component Value Date/Time   NA 140 06/28/2013 1147   NA 136 03/16/2013 1930   K 3.4* 06/28/2013 1147   K 3.8 03/16/2013 1930   CL 112* 05/10/2013 0849   CL 110 03/16/2013 1930   CO2 21* 06/28/2013 1147   CO2 16* 03/16/2013 1930   BUN 14.8 06/28/2013 1147   BUN 22 03/16/2013 1930   CREATININE 1.0 06/28/2013 1147   CREATININE 1.24 03/16/2013 1930      Component Value Date/Time   CALCIUM 8.8 06/28/2013 1147   CALCIUM 8.2* 03/16/2013 1930   ALKPHOS 31* 06/28/2013 1147   ALKPHOS 31* 03/16/2013 1930   AST 11 06/28/2013 1147   AST 11 03/16/2013 1930   ALT 8 06/28/2013 1147   ALT 15 03/16/2013 1930   BILITOT 0.66 06/28/2013 1147   BILITOT 0.2* 03/16/2013 1930       RADIOGRAPHIC STUDIES:  No results found.  ASSESSMENT AND PLAN: This is a very pleasant 52 years old [place] American male with recurrent multiple myeloma currently on treatment with Carfilzomib, Cytoxan and dexamethasone status post 2 cycles. He tolerated his treatment fairly well with no significant adverse effects except for fatigue secondary to chemotherapy-induced anemia and the patient required PRBCs transfusion times doing this course. He has repeat myeloma panel for reevaluation of his disease but the results are still pending. The patient had fever yesterday but feeling much better today. I will order a chest x-ray to rule out any pneumonia. I would the start of cycle #3 till next week to give him more time to recover. For the chemotherapy-induced anemia, I will arrange for the patient to receive 2 units of PRBCs transfusion. He was advised to call immediately if he has any concerning symptoms in the interval. The patient voices understanding of current disease status and treatment options and is in agreement with the current care plan.  All questions were answered. The patient knows to call the clinic with any problems, questions or concerns. We can certainly see the patient much sooner if necessary.  I spent 15  minutes counseling the patient face to face. The total time spent in the appointment was 25 minutes.

## 2013-07-05 NOTE — Progress Notes (Signed)
Quick Note:  Call patient with the result and order K Dur 20 meq po qd x7 days ______ 

## 2013-07-06 ENCOUNTER — Telehealth: Payer: Self-pay | Admitting: *Deleted

## 2013-07-06 DIAGNOSIS — C9000 Multiple myeloma not having achieved remission: Secondary | ICD-10-CM

## 2013-07-06 LAB — IGG, IGA, IGM
IgA: 8 mg/dL — ABNORMAL LOW (ref 68–379)
IgG (Immunoglobin G), Serum: 4890 mg/dL — ABNORMAL HIGH (ref 650–1600)

## 2013-07-06 LAB — KAPPA/LAMBDA LIGHT CHAINS
Kappa free light chain: 91 mg/dL — ABNORMAL HIGH (ref 0.33–1.94)
Lambda Free Lght Chn: 0.49 mg/dL — ABNORMAL LOW (ref 0.57–2.63)

## 2013-07-06 NOTE — Telephone Encounter (Signed)
Message copied by Caren Griffins on Tue Jul 06, 2013  3:23 PM ------      Message from: Si Gaul      Created: Mon Jul 05, 2013  1:49 PM       Call patient with the result and order K Dur 20 meq po qd x 7 days. ------

## 2013-07-07 ENCOUNTER — Ambulatory Visit: Payer: 59 | Admitting: Lab

## 2013-07-07 ENCOUNTER — Ambulatory Visit (HOSPITAL_BASED_OUTPATIENT_CLINIC_OR_DEPARTMENT_OTHER): Payer: 59

## 2013-07-07 VITALS — BP 139/59 | HR 72 | Temp 97.8°F | Resp 18

## 2013-07-07 DIAGNOSIS — C9000 Multiple myeloma not having achieved remission: Secondary | ICD-10-CM

## 2013-07-07 MED ORDER — SODIUM CHLORIDE 0.9 % IV SOLN
250.0000 mL | Freq: Once | INTRAVENOUS | Status: AC
  Administered 2013-07-07: 250 mL via INTRAVENOUS

## 2013-07-07 MED ORDER — DIPHENHYDRAMINE HCL 25 MG PO CAPS
25.0000 mg | ORAL_CAPSULE | Freq: Once | ORAL | Status: AC
  Administered 2013-07-07: 25 mg via ORAL

## 2013-07-07 MED ORDER — ACETAMINOPHEN 325 MG PO TABS
650.0000 mg | ORAL_TABLET | Freq: Once | ORAL | Status: AC
  Administered 2013-07-07: 650 mg via ORAL

## 2013-07-07 MED ORDER — HEPARIN SOD (PORK) LOCK FLUSH 100 UNIT/ML IV SOLN
500.0000 [IU] | Freq: Every day | INTRAVENOUS | Status: AC | PRN
  Administered 2013-07-07: 500 [IU]
  Filled 2013-07-07: qty 5

## 2013-07-07 MED ORDER — SODIUM CHLORIDE 0.9 % IJ SOLN
10.0000 mL | INTRAMUSCULAR | Status: AC | PRN
  Administered 2013-07-07: 10 mL
  Filled 2013-07-07: qty 10

## 2013-07-07 NOTE — Patient Instructions (Addendum)
Blood Transfusion  A blood transfusion replaces your blood or some of its parts. Blood is replaced when you have lost blood because of surgery, an accident, or for severe blood conditions like anemia. You can donate blood to be used on yourself if you have a planned surgery. If you lose blood during that surgery, your own blood can be given back to you. Any blood given to you is checked to make sure it matches your blood type. Your temperature, blood pressure, and heart rate (vital signs) will be checked often.  GET HELP RIGHT AWAY IF:   You feel sick to your stomach (nauseous) or throw up (vomit).  You have watery poop (diarrhea).  You have shortness of breath or trouble breathing.  You have blood in your pee (urine) or have dark colored pee.  You have chest pain or tightness.  Your eyes or skin turn yellow (jaundice).  You have a temperature by mouth above 102 F (38.9 C), not controlled by medicine.  You start to shake and have chills.  You develop a a red rash (hives) or feel itchy.  You develop lightheadedness or feel confused.  You develop back, joint, or muscle pain.  You do not feel hungry (lost appetite).  You feel tired, restless, or nervous.  You develop belly (abdominal) cramps. Document Released: 01/31/2009 Document Revised: 01/27/2012 Document Reviewed: 01/31/2009 [employer] Patient Information 2014 [employer].  

## 2013-07-08 LAB — TYPE AND SCREEN: Unit division: 0

## 2013-07-08 MED ORDER — POTASSIUM CHLORIDE CRYS ER 20 MEQ PO TBCR: 20.0000 meq | EXTENDED_RELEASE_TABLET | ORAL | Status: DC

## 2013-07-08 NOTE — Telephone Encounter (Signed)
Pt verbalized understanding.  SLJ 

## 2013-07-10 IMAGING — CR DG CHEST 2V
2 series · 2 of 2 positions shown · non-contrast
Comparison: 12/14/2012; 12/13/2012; 12/07/2012; chest CT -
12/09/2012

CLINICAL DATA: Shortness of breath, post VATS

CHEST - 2 VIEW

[w chest pa]
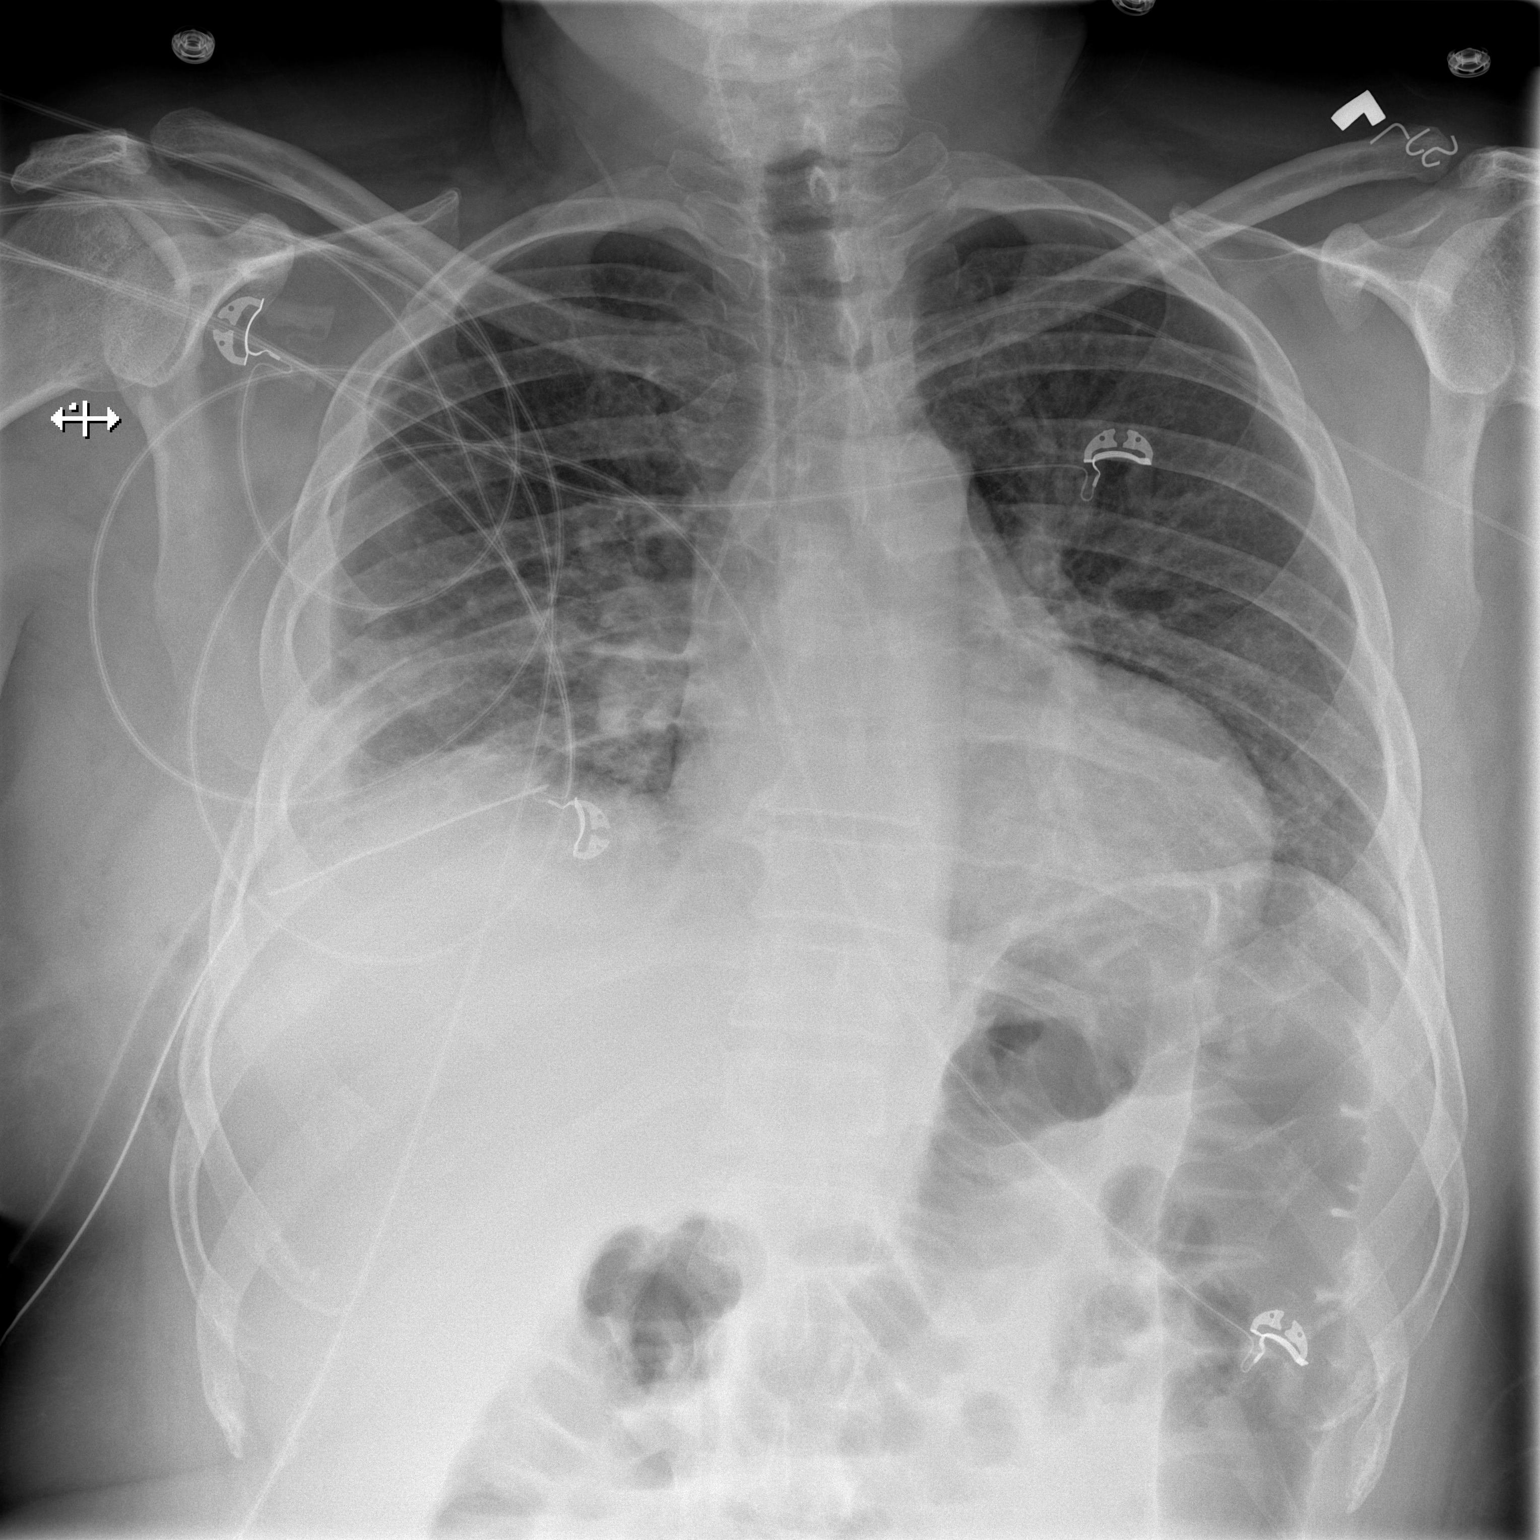

[w chest lat]
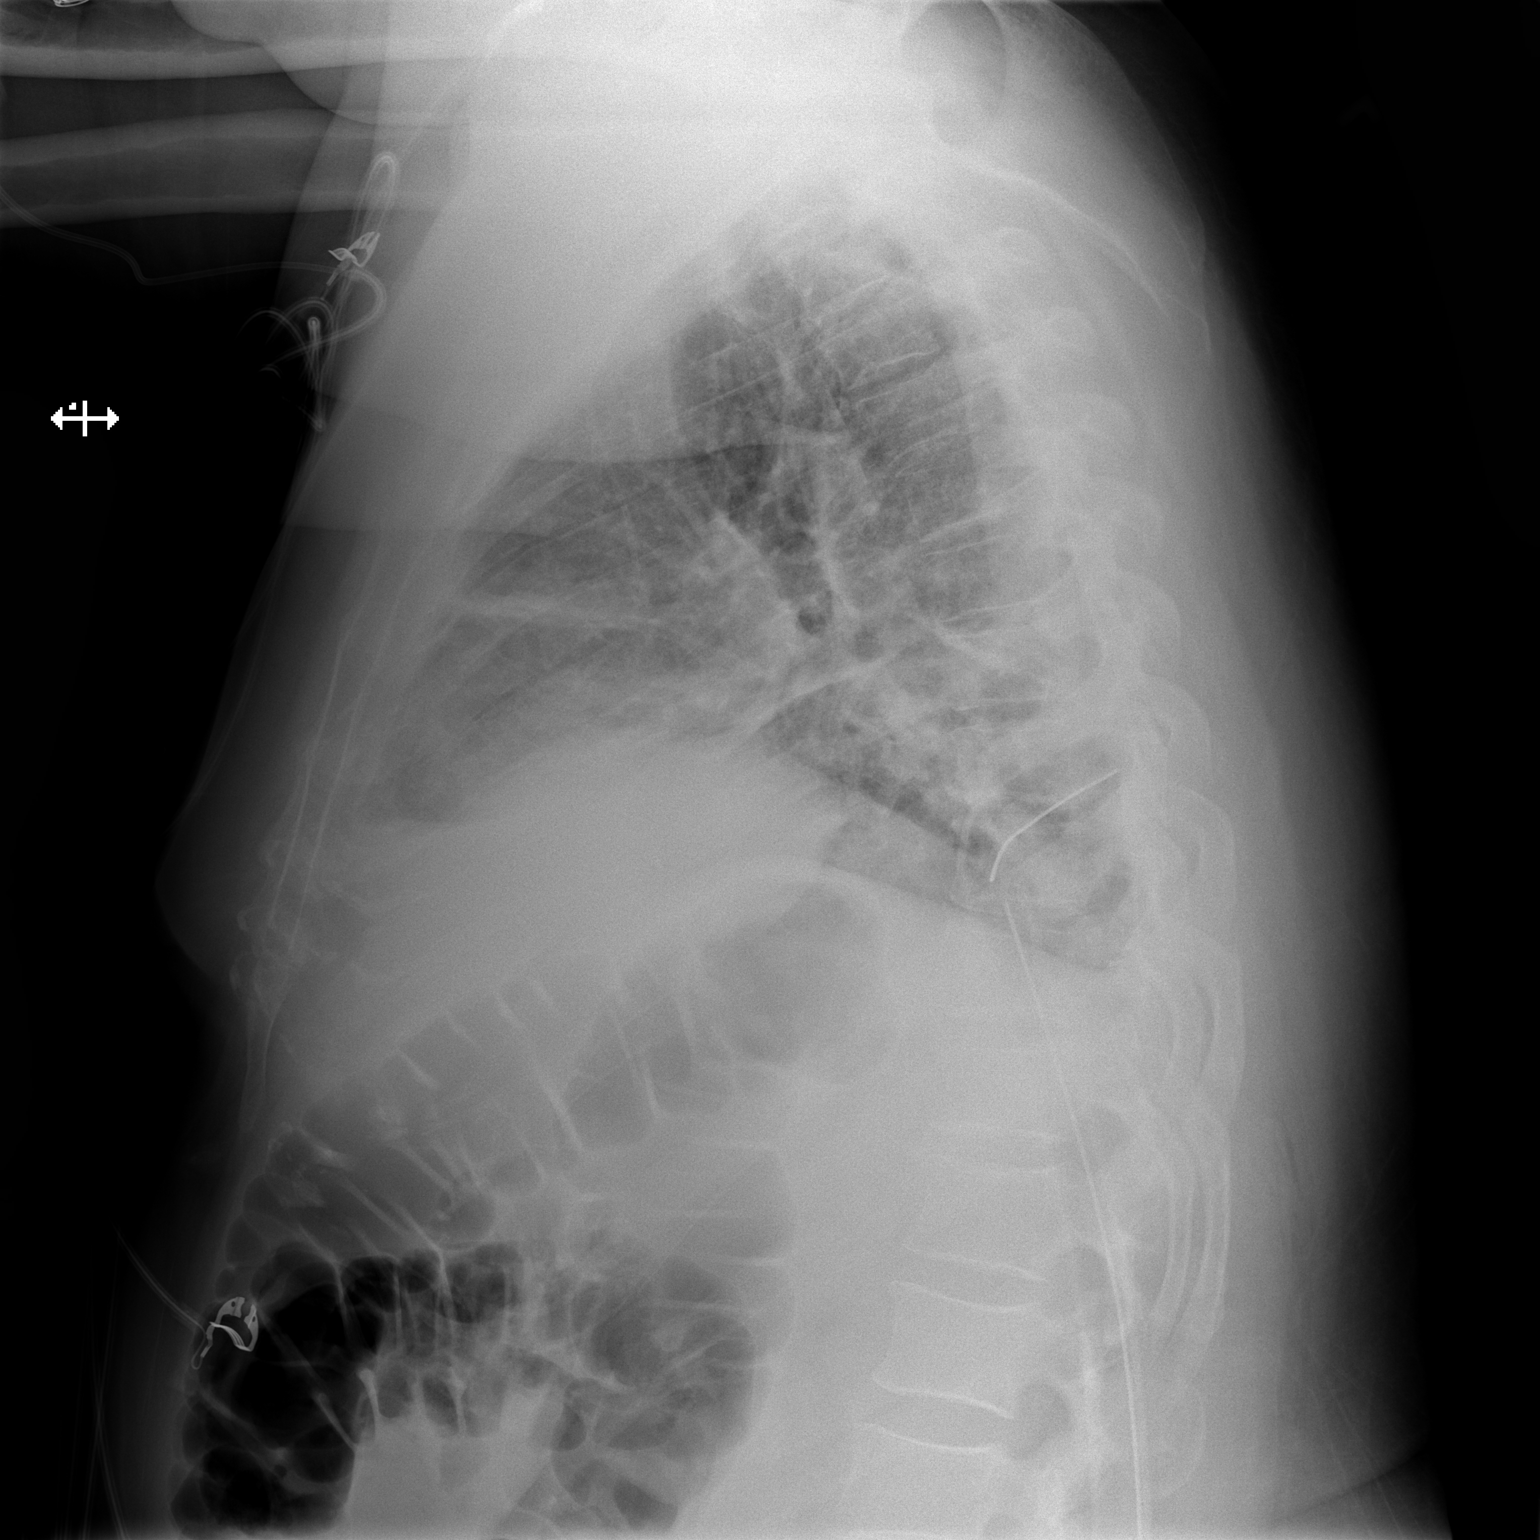

[2 of 2 positions shown; findings below may reference images not displayed]

FINDINGS: Grossly unchanged cardiac silhouette and mediastinal
contours.  Interval removal of right jugular approach vascular
sheath.  Interval removal of one of two right-sided basilar chest
tubes.  The remaining right basilar chest tube side port is again
located at the lateral aspect of the right pleural space.  Grossly
unchanged small possibly partially loculated right-sided pleural
effusion.  No pneumothorax.  Unchanged right mid and lower lung
heterogeneous opacities.  No new focal airspace opacities.
Unchanged bones.  Mild gaseous distension of the colon.
IMPRESSION: 1.  Interval removal of right jugular approach vascular sheath and
one of two right-sided basilar chest tubes.  No pneumothorax.
2.  Remaining right basilar chest tube side port overlies the
peripheral aspect of the right pleural space.
3.  Unchanged small likely partially loculated right-sided pleural
effusion and associated right mid and lower lung heterogeneous
opacities, atelectasis versus infiltrate.

## 2013-07-11 IMAGING — CR DG CHEST 2V
2 series · 2 of 2 positions shown · non-contrast
Comparison: 12/15/2012; 12/14/2012; 12/07/2012; chest CT -
12/09/2012

CLINICAL DATA: Post vats, post recent chest tube removal

CHEST - 2 VIEW

[w chest pa]
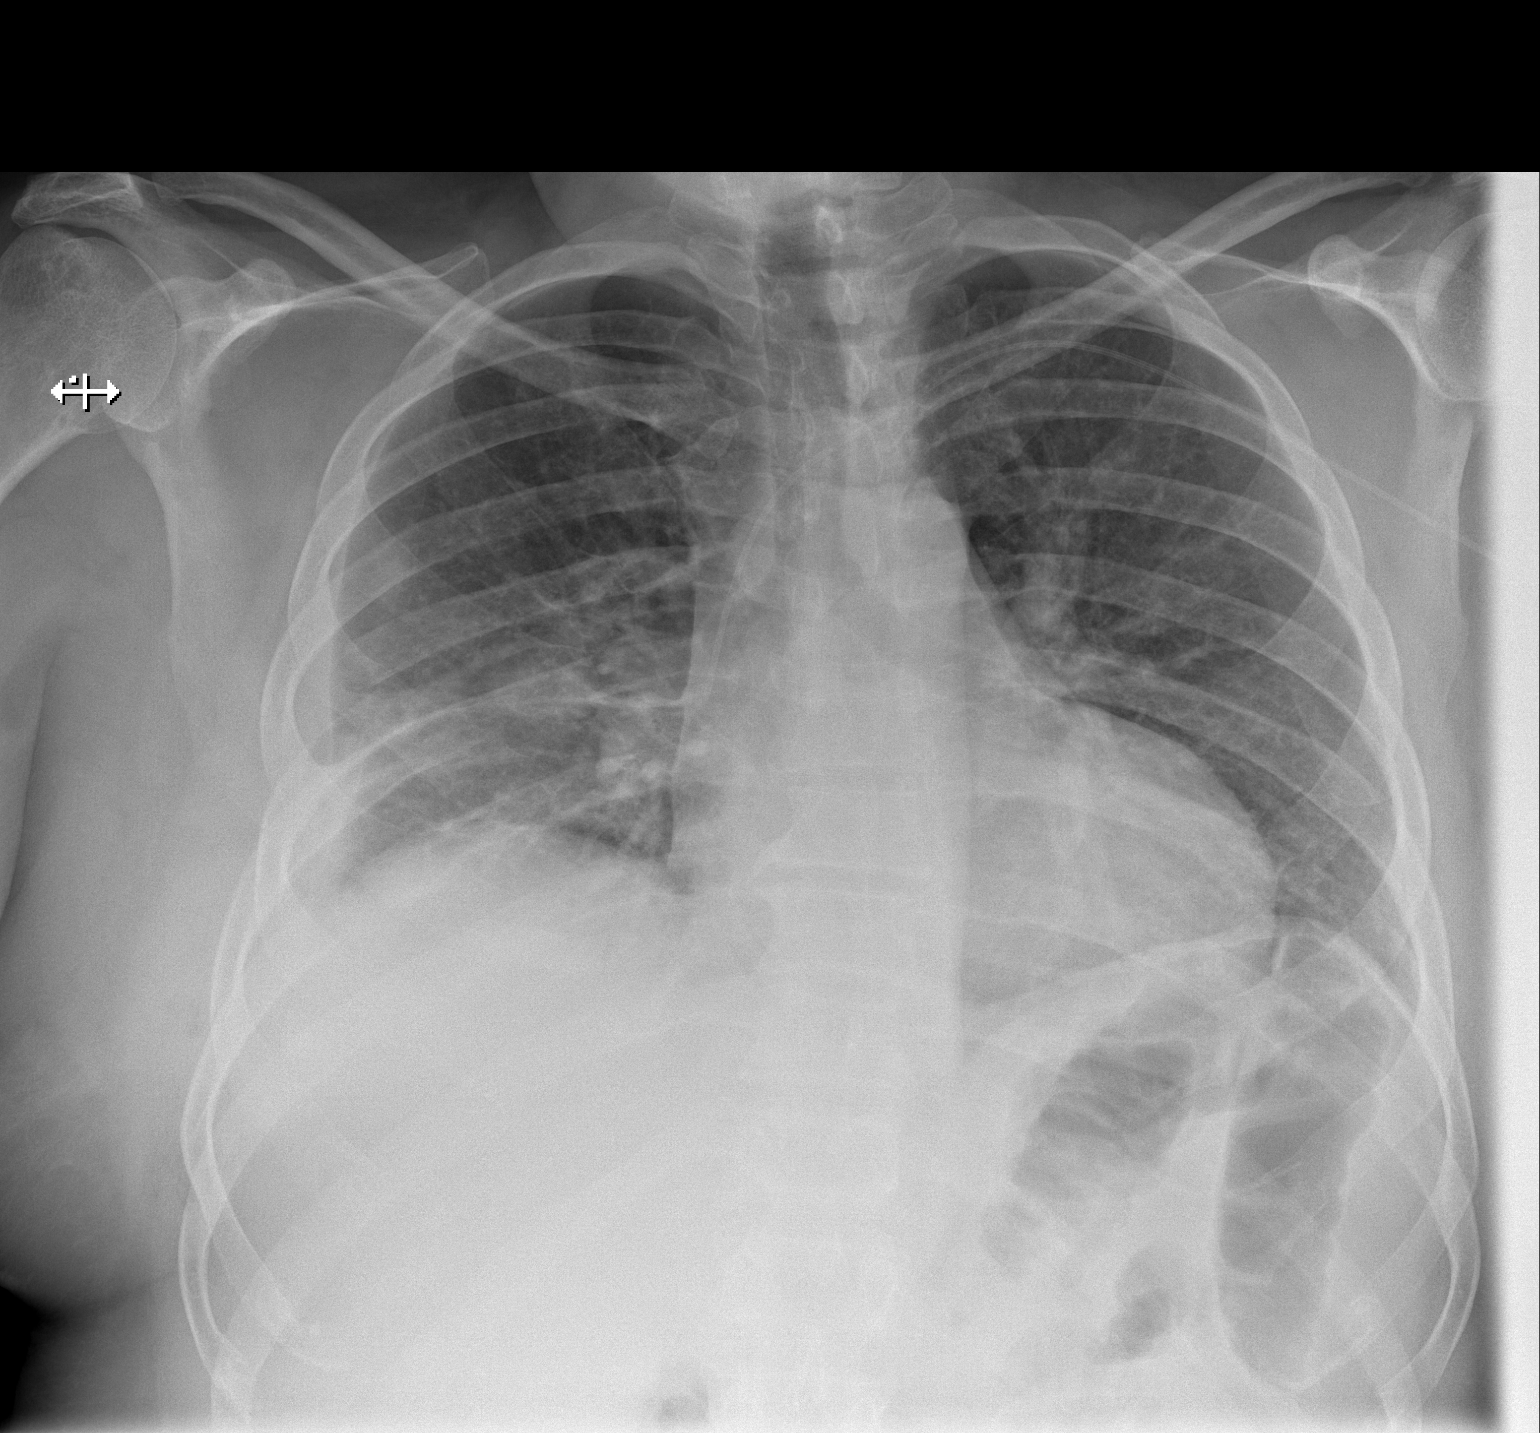

[w chest lat]
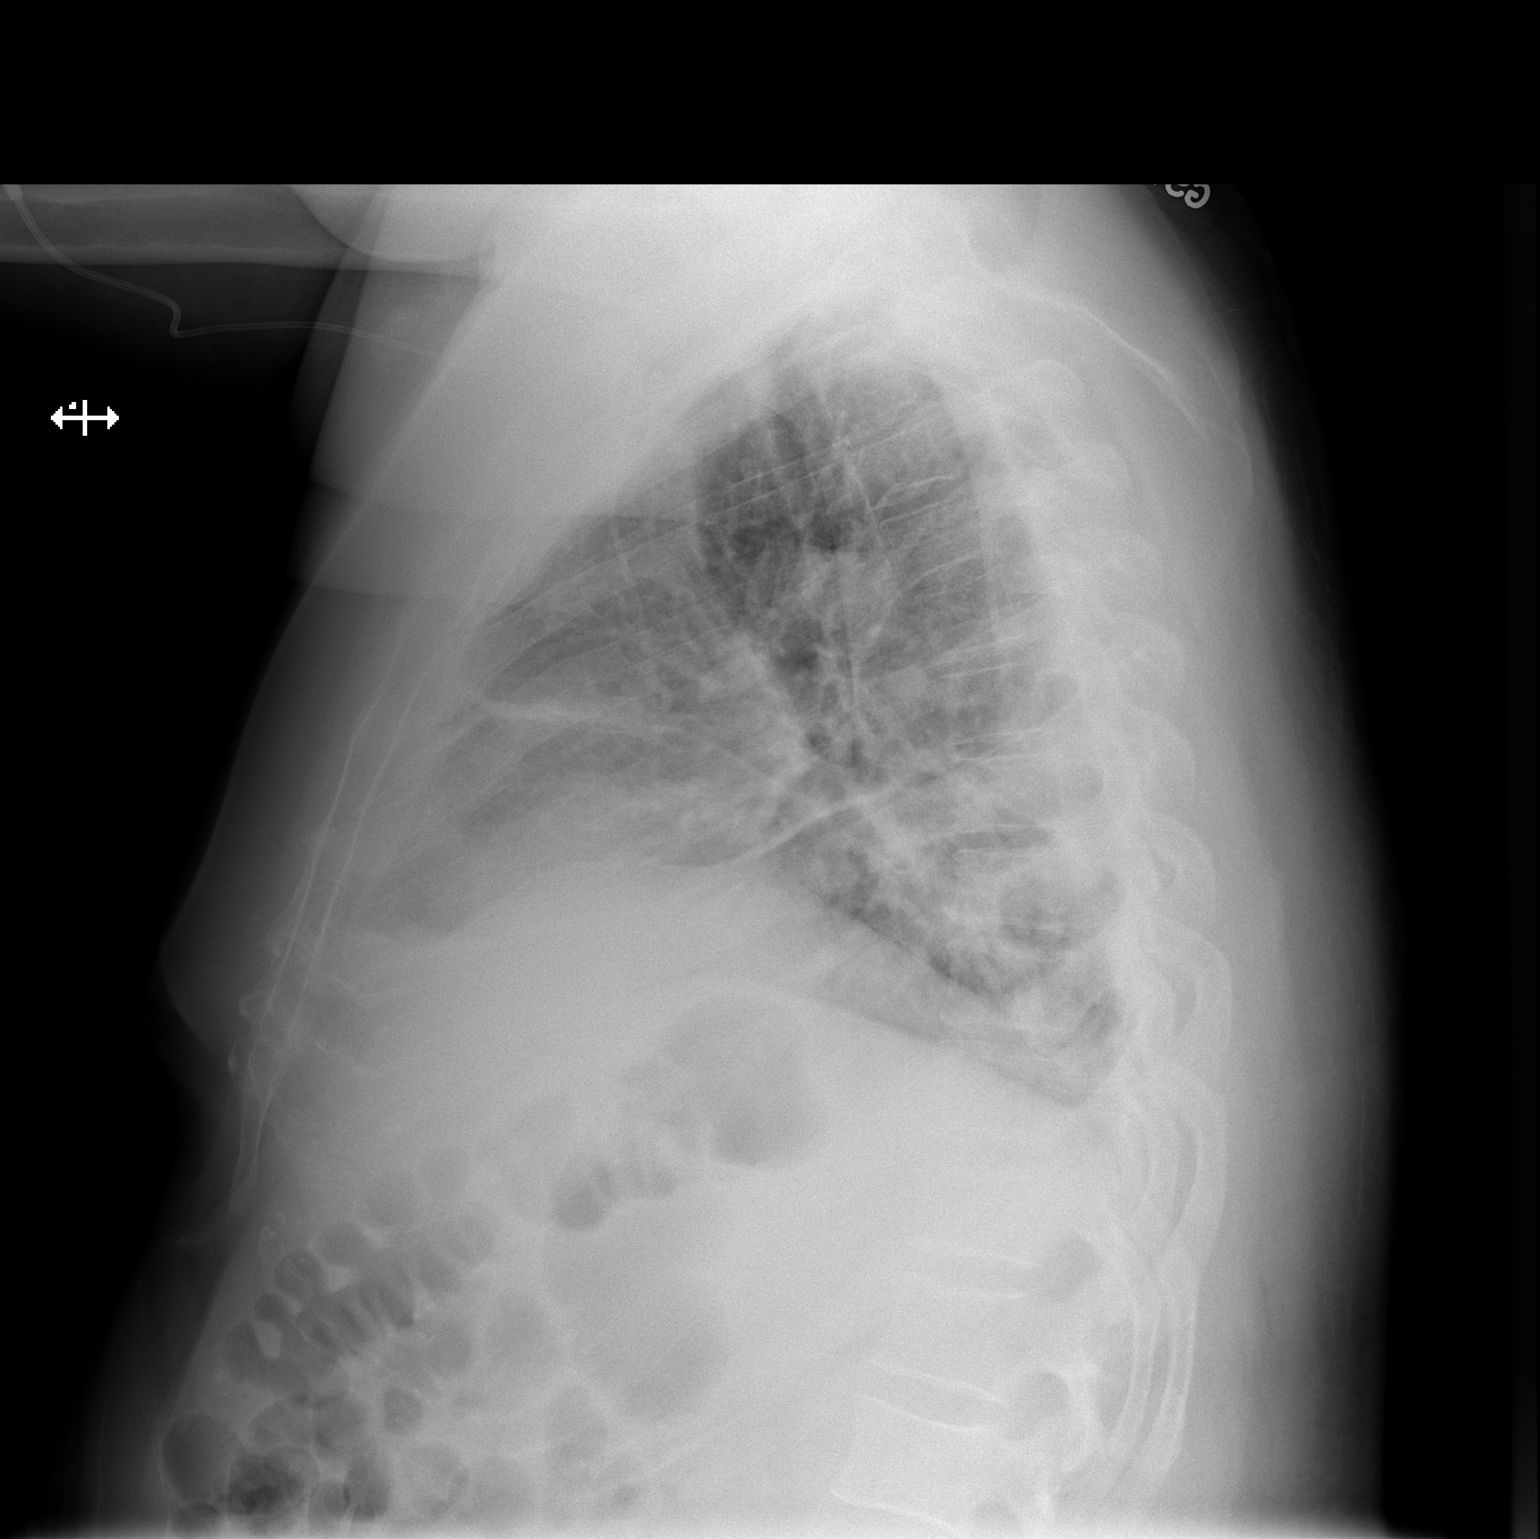

[2 of 2 positions shown; findings below may reference images not displayed]

FINDINGS: Grossly unchanged cardiac silhouette and mediastinal contours.

Interval removal of right basilar chest tube without definite
pneumothorax.  Stable position of left upper extremity approach
PICC line.

Grossly unchanged partially loculated small right-sided pleural
effusion and associated right mid and basilar heterogeneous
opacities.  No new focal airspace opacities.  No definite evidence
of edema.  Unchanged bones.
IMPRESSION: 1.  Interval removal of right basilar chest tube.  No pneumothorax.
2.  Unchanged partially loculated right-sided effusion and
associated right mid and lower lung opacities with differential
considerations including atelectasis, contusion and infection.

## 2013-07-12 ENCOUNTER — Telehealth: Payer: Self-pay | Admitting: Internal Medicine

## 2013-07-12 ENCOUNTER — Encounter: Payer: Self-pay | Admitting: Internal Medicine

## 2013-07-12 ENCOUNTER — Other Ambulatory Visit: Payer: 59 | Admitting: Lab

## 2013-07-12 ENCOUNTER — Ambulatory Visit: Payer: 59 | Admitting: Pharmacist

## 2013-07-12 ENCOUNTER — Other Ambulatory Visit (HOSPITAL_BASED_OUTPATIENT_CLINIC_OR_DEPARTMENT_OTHER): Payer: 59 | Admitting: Lab

## 2013-07-12 ENCOUNTER — Ambulatory Visit (HOSPITAL_BASED_OUTPATIENT_CLINIC_OR_DEPARTMENT_OTHER): Payer: 59

## 2013-07-12 ENCOUNTER — Ambulatory Visit (HOSPITAL_BASED_OUTPATIENT_CLINIC_OR_DEPARTMENT_OTHER): Payer: 59 | Admitting: Internal Medicine

## 2013-07-12 VITALS — BP 152/84 | HR 79 | Temp 97.9°F | Resp 18 | Ht 63.0 in | Wt 224.9 lb

## 2013-07-12 DIAGNOSIS — C9002 Multiple myeloma in relapse: Secondary | ICD-10-CM

## 2013-07-12 DIAGNOSIS — Z5112 Encounter for antineoplastic immunotherapy: Secondary | ICD-10-CM

## 2013-07-12 DIAGNOSIS — G8918 Other acute postprocedural pain: Secondary | ICD-10-CM

## 2013-07-12 DIAGNOSIS — G629 Polyneuropathy, unspecified: Secondary | ICD-10-CM

## 2013-07-12 DIAGNOSIS — C9000 Multiple myeloma not having achieved remission: Secondary | ICD-10-CM

## 2013-07-12 DIAGNOSIS — I776 Arteritis, unspecified: Secondary | ICD-10-CM

## 2013-07-12 DIAGNOSIS — R5381 Other malaise: Secondary | ICD-10-CM

## 2013-07-12 LAB — CBC WITH DIFFERENTIAL/PLATELET
Basophils Absolute: 0 10*3/uL (ref 0.0–0.1)
EOS%: 1 % (ref 0.0–7.0)
Eosinophils Absolute: 0.1 10*3/uL (ref 0.0–0.5)
HCT: 28.9 % — ABNORMAL LOW (ref 38.4–49.9)
HGB: 9.5 g/dL — ABNORMAL LOW (ref 13.0–17.1)
MCH: 30.7 pg (ref 27.2–33.4)
MCHC: 32.9 g/dL (ref 32.0–36.0)
MONO#: 0.7 10*3/uL (ref 0.1–0.9)
NEUT#: 3.6 10*3/uL (ref 1.5–6.5)
NEUT%: 68.8 % (ref 39.0–75.0)
RDW: 19.6 % — ABNORMAL HIGH (ref 11.0–14.6)
WBC: 5.2 10*3/uL (ref 4.0–10.3)
lymph#: 0.9 10*3/uL (ref 0.9–3.3)

## 2013-07-12 LAB — COMPREHENSIVE METABOLIC PANEL (CC13)
ALT: 10 U/L (ref 0–55)
AST: 13 U/L (ref 5–34)
Albumin: 2.5 g/dL — ABNORMAL LOW (ref 3.5–5.0)
Alkaline Phosphatase: 30 U/L — ABNORMAL LOW (ref 40–150)
BUN: 9.2 mg/dL (ref 7.0–26.0)
Chloride: 109 mEq/L (ref 98–109)
Potassium: 3.2 mEq/L — ABNORMAL LOW (ref 3.5–5.1)

## 2013-07-12 LAB — PROTIME-INR
INR: 3.1 (ref 2.00–3.50)
Protime: 37.2 Seconds — ABNORMAL HIGH (ref 10.6–13.4)

## 2013-07-12 LAB — POCT INR: INR: 3.1

## 2013-07-12 IMAGING — CR DG CHEST 2V
2 series · 2 of 2 positions shown · non-contrast
Comparison: 12/16/2012

CLINICAL DATA: Cough.  Chest pain.  Postop from VATS

CHEST - 2 VIEW

[w chest pa]
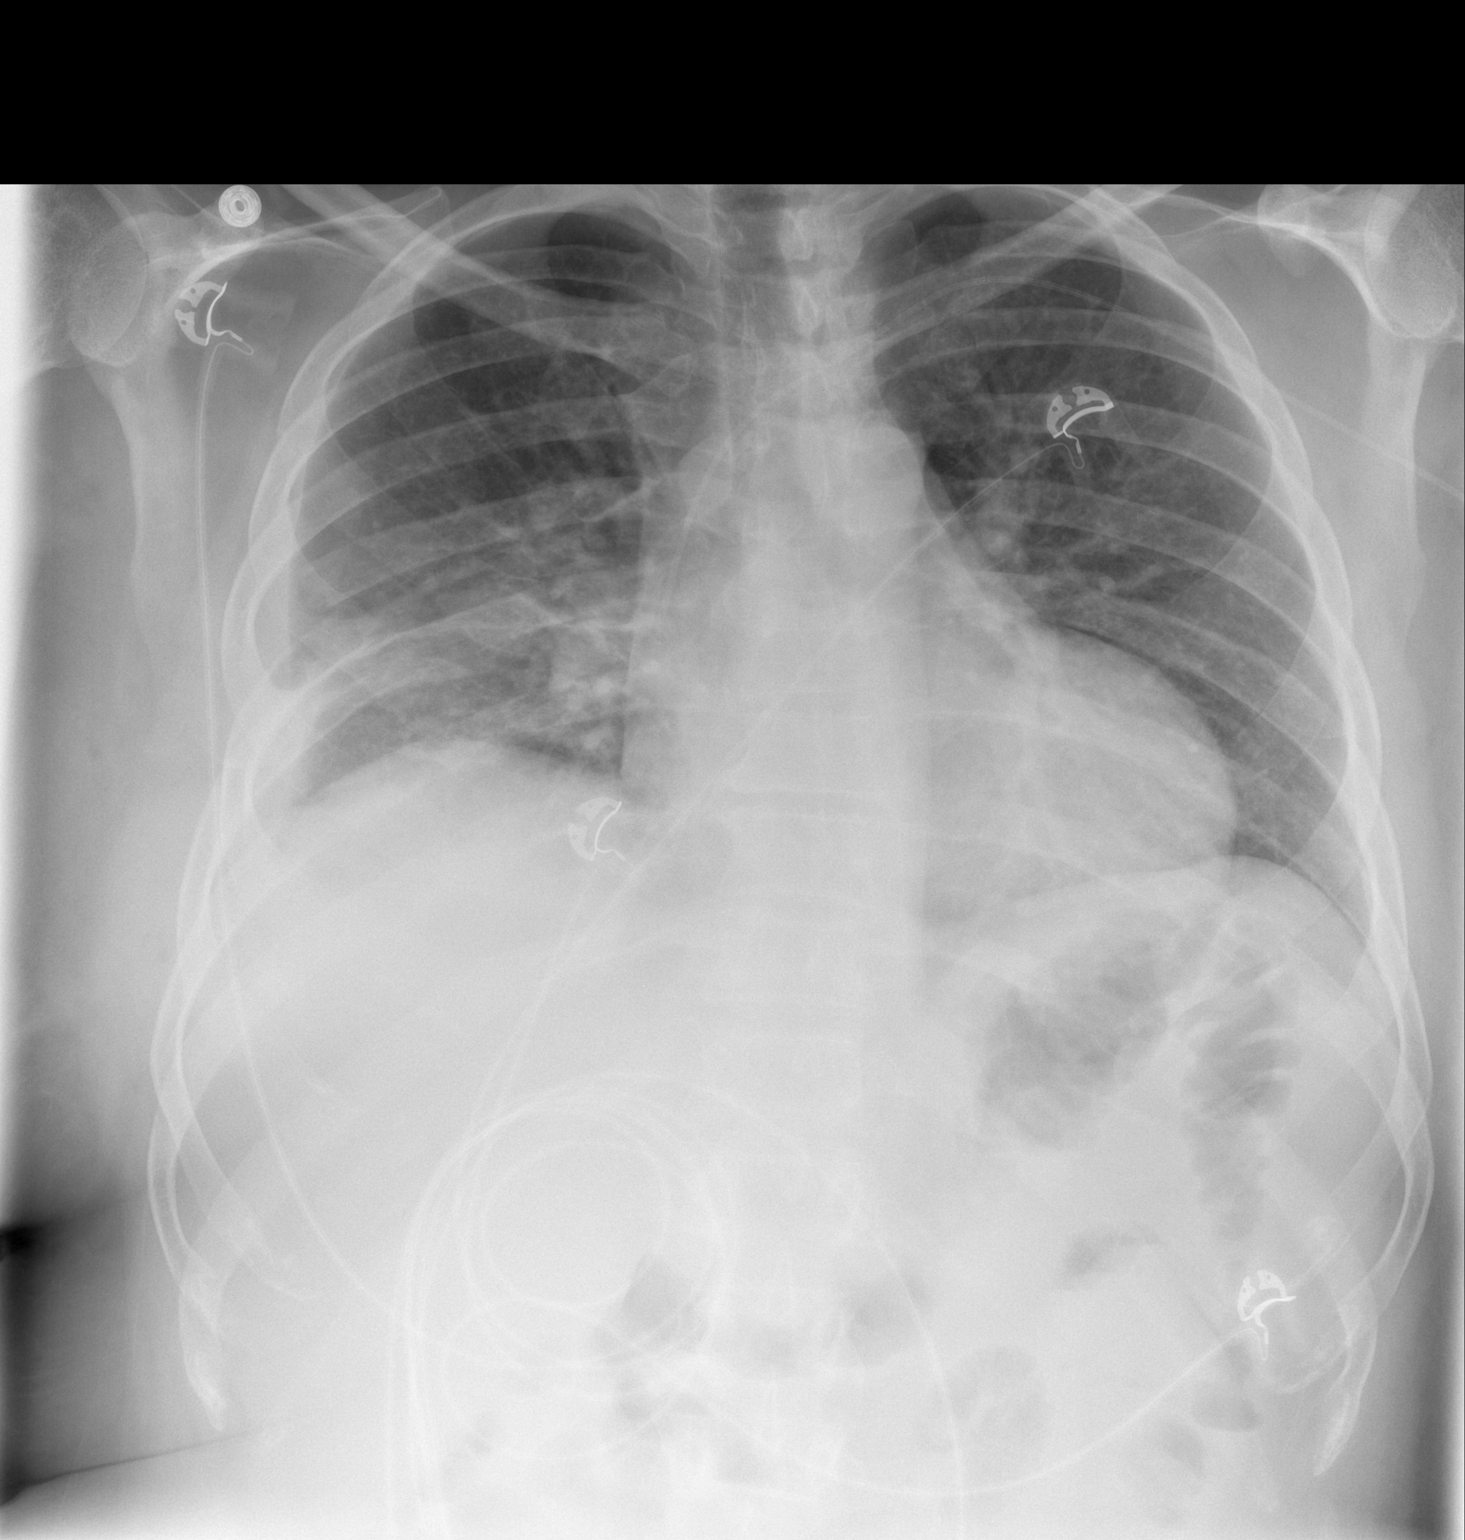

[w chest lat]
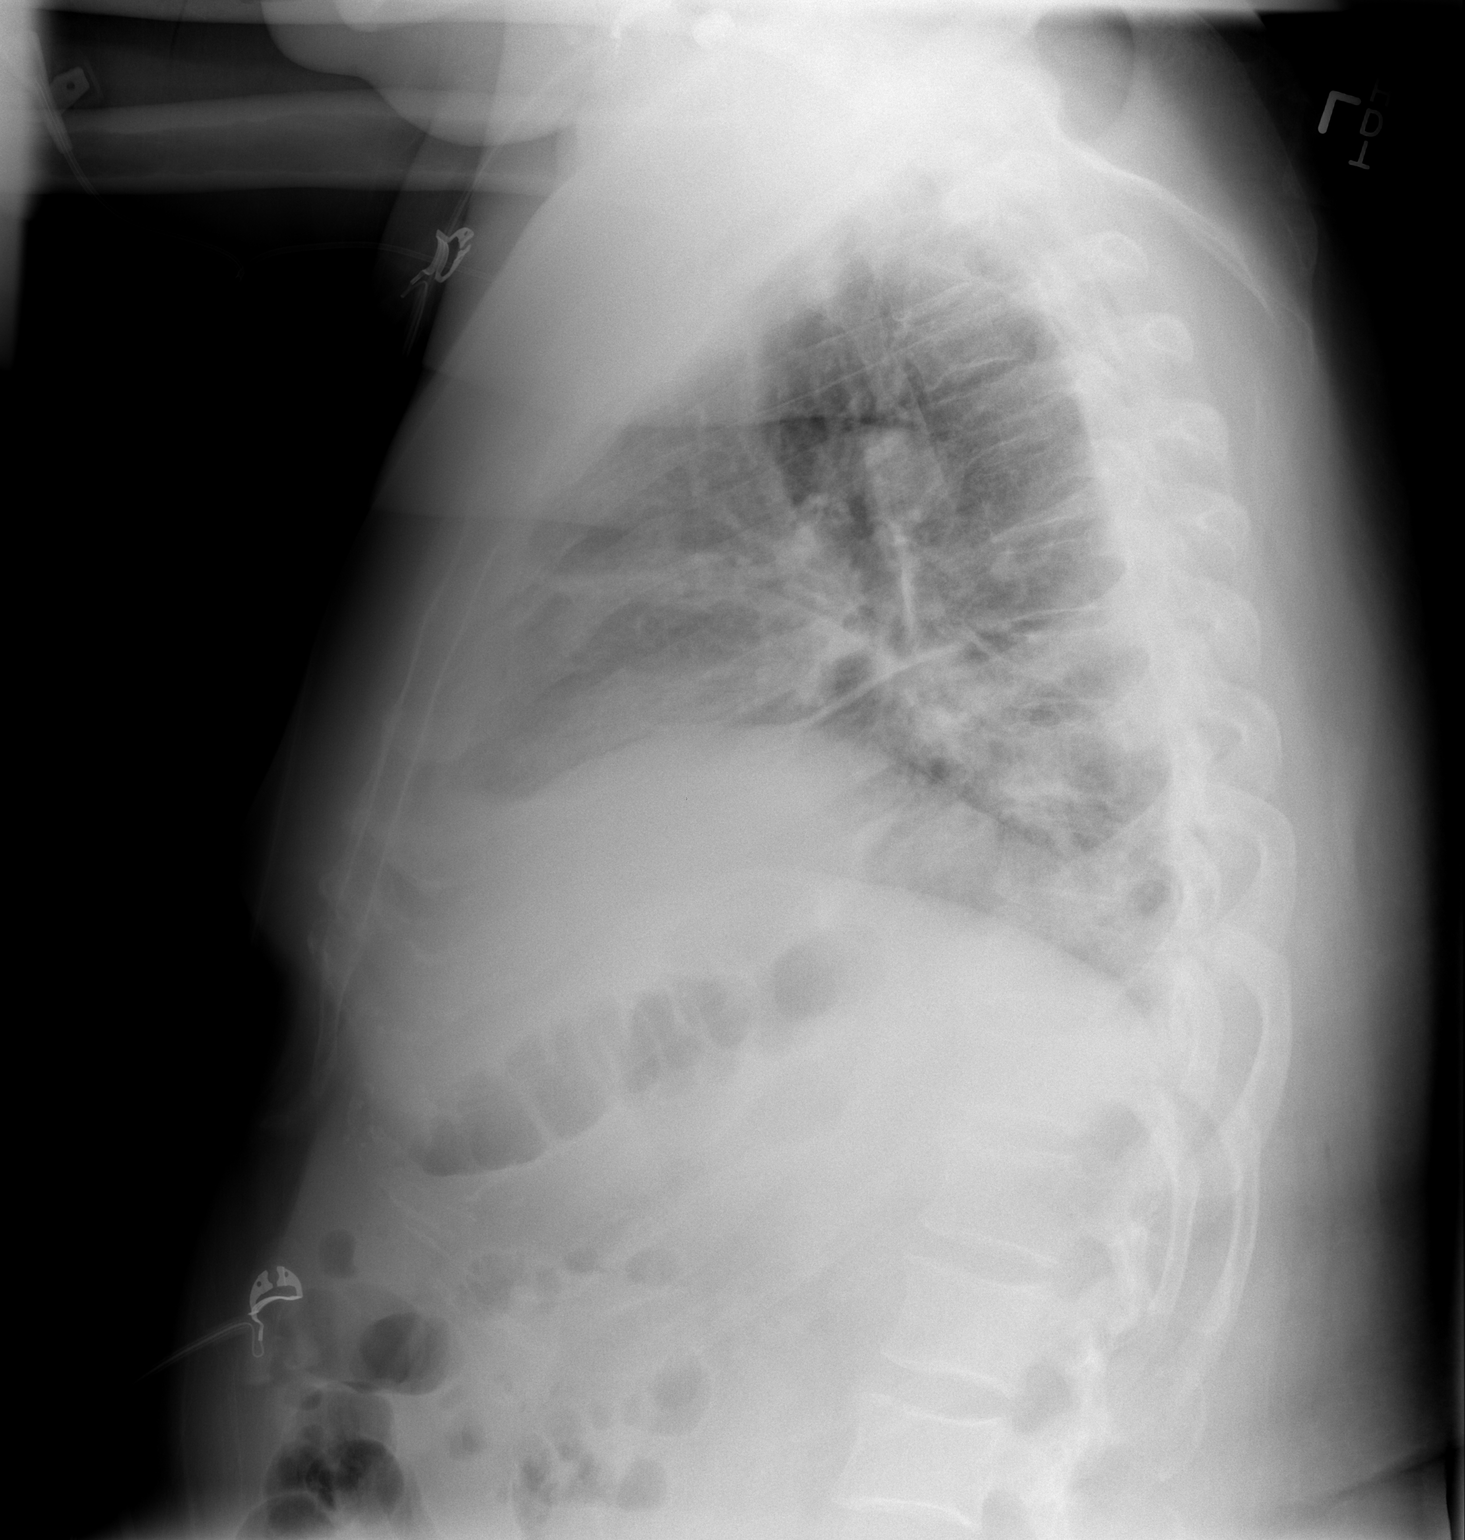

[2 of 2 positions shown; findings below may reference images not displayed]

FINDINGS: A small right pleural effusion is stable as well as a
right middle and lower lobe atelectasis or infiltrate.  Left lung
is clear.  Heart size is normal.  Left arm PICC line remains in
appropriate position.
IMPRESSION: Stable small right pleural effusion, and right middle and lower
lobe atelectasis versus infiltrate.

## 2013-07-12 MED ORDER — ONDANSETRON 8 MG/50ML IVPB (CHCC)
8.0000 mg | Freq: Once | INTRAVENOUS | Status: AC
  Administered 2013-07-12: 8 mg via INTRAVENOUS

## 2013-07-12 MED ORDER — HYDROCODONE-ACETAMINOPHEN 10-325 MG PO TABS: 1.0000 | ORAL_TABLET | Freq: Four times a day (QID) | ORAL | Status: DC | PRN

## 2013-07-12 MED ORDER — HEPARIN SOD (PORK) LOCK FLUSH 100 UNIT/ML IV SOLN
500.0000 [IU] | Freq: Once | INTRAVENOUS | Status: AC | PRN
  Administered 2013-07-12: 500 [IU]
  Filled 2013-07-12: qty 5

## 2013-07-12 MED ORDER — CYCLOPHOSPHAMIDE CHEMO INJECTION 1 GM
300.0000 mg/m2 | Freq: Once | INTRAMUSCULAR | Status: AC
  Administered 2013-07-12: 640 mg via INTRAVENOUS
  Filled 2013-07-12: qty 32

## 2013-07-12 MED ORDER — SODIUM CHLORIDE 0.9 % IJ SOLN
10.0000 mL | INTRAMUSCULAR | Status: DC | PRN
  Administered 2013-07-12: 10 mL
  Filled 2013-07-12: qty 10

## 2013-07-12 MED ORDER — DEXTROSE 5 % IV SOLN
27.0000 mg/m2 | Freq: Once | INTRAVENOUS | Status: AC
  Administered 2013-07-12: 56 mg via INTRAVENOUS
  Filled 2013-07-12: qty 28

## 2013-07-12 MED ORDER — SODIUM CHLORIDE 0.9 % IV SOLN
Freq: Once | INTRAVENOUS | Status: AC
  Administered 2013-07-12: 10:00:00 via INTRAVENOUS

## 2013-07-12 MED ORDER — DEXAMETHASONE SODIUM PHOSPHATE 10 MG/ML IJ SOLN
10.0000 mg | Freq: Once | INTRAMUSCULAR | Status: AC
  Administered 2013-07-12: 10 mg via INTRAVENOUS

## 2013-07-12 NOTE — Patient Instructions (Signed)
Continue Coumadin 5mg  daily except 7.5 mg on Mondays.   Recheck PT/INR with next scheduled lab on 07/20/13: lab @ 8am and coumadin clinic at 8:15am.

## 2013-07-12 NOTE — Progress Notes (Signed)
INR very near goal today. No problems/concerns to report regarding anticoagulation. Pt took coumadin as instructed at last visit. No missed doses. No changes in diet. No medication changes. Will continue Coumadin 5mg  daily except 7.5 mg on Mondays.   Recheck PT/INR with next scheduled lab on 07/20/13: lab @ 8am and coumadin clinic at 8:15am.

## 2013-07-12 NOTE — Patient Instructions (Addendum)
[facility] Discharge Instructions for Patients Receiving Chemotherapy  Today you received the following chemotherapy agents Cytoxan and Kyprolis.  To help prevent nausea and vomiting after your treatment, we encourage you to take your nausea medication as prescribed.   If you develop nausea and vomiting that is not controlled by your nausea medication, call the clinic.   BELOW ARE SYMPTOMS THAT SHOULD BE REPORTED IMMEDIATELY:  *FEVER GREATER THAN 100.5 F  *CHILLS WITH OR WITHOUT FEVER  NAUSEA AND VOMITING THAT IS NOT CONTROLLED WITH YOUR NAUSEA MEDICATION  *UNUSUAL SHORTNESS OF BREATH  *UNUSUAL BRUISING OR BLEEDING  TENDERNESS IN MOUTH AND THROAT WITH OR WITHOUT PRESENCE OF ULCERS  *URINARY PROBLEMS  *BOWEL PROBLEMS  UNUSUAL RASH Items with * indicate a potential emergency and should be followed up as soon as possible.  Feel free to call the clinic you have any questions or concerns. The clinic phone number is [phone].    

## 2013-07-12 NOTE — Progress Notes (Signed)
Quick Note:  Call patient with the result and order K Dur 20 meq po Qd #30 ______

## 2013-07-12 NOTE — Telephone Encounter (Signed)
gv and printed appt sched and avs for for pt...MW added tx.

## 2013-07-12 NOTE — Progress Notes (Signed)
[facility] [phone]   [phone]  OFFICE PROGRESS NOTE  [name], MD [address] [place] [address]  Principle Diagnosis:  #1 recurrent multiple myeloma IgG kappa subtype diagnosed in June of 2008  #2 history of vasculitis and thrombosis of skin lesions   Prior Therapy:  #1 status post palliative radiotherapy to the left hip under the care of Dr. Roselind Messier  #2 status post 5 cycles of systemic chemotherapy with Revlimid and low dose Decadron. Last dose given June 2009 with good response.  #3 status post autologous peripheral blood stem cell transplant at [facility] on 02/02/2008.  #4 the patient had evidence for disease recurrence in December 2010.  #5 Revlimid 25 mg by mouth daily for 21 days every 4 weeks in addition to Decadron 40 mg orally on a weekly basis. The patient is status post 28 cycles, discontinued today secondary to disease progression.  #6 Systemic chemotherapy with Velcade 1.3 mg/M2 on days 1, 4, 8 and 11 in addition to Doxil 30 mg/M2 on day 4 and Decadron 40 mg by mouth on weekly basis every 3 weeks. Status post 3 cycles, last dose was given 05/04/2012 discontinued secondary to intolerance.  #7 Salvage therapy treatment with the Pomalyst 4 mg by mouth daily for 21 days every 28 days as well as Cytoxan 50 mg by mouth every other day and dexamethasone 40 mg by mouth once weekly. Therapy beginning 09/25/2012, status post 2 cycles discontinued secondary to disease progression and intolerance.  #8 Kyprolis (Carfilzomib) 27 mg/M2 on days 1, 2, 8, 9, 15 and 16 every 4 weeks. First dose on 02/08/2013. This would be concurrent with the dexamethasone 40 mg by mouth on a weekly basis. Status post 3 cycles with disease progression.   Current therapy:  1) Systemic chemotherapy with Carfilzomib 27 mg/M2 days 1, , 8, 9, 15 and 16, Cytoxan 300 mg/M2 and dexamethasone 40 mg by mouth weekly every 4 weeks. Status  post 2 cycles.  2) Zometa 4 mg IV given every 3 months.    INTERVAL HISTORY: [name] 52 y.o. male returns to the clinic today for followup visit accompanied his wife. The patient is feeling a little bit better today after he received 2 units of PRBCs transfusion last week. He denied having any significant weakness but continues to have mild fatigue. He has no chest pain, shortness of breath, cough or hemoptysis. The patient denied having any significant fever or chills, no weight loss or night sweats. He had repeat myeloma panel performed recently and he is here for evaluation and discussion of his lab results and recommendation regarding treatment of his condition.  MEDICAL HISTORY: Past Medical History  Diagnosis Date  . Multiple myeloma 09/24/2011  . Hypertension     ALLERGIES:  is allergic to red dye and other.  MEDICATIONS:  Current Outpatient Prescriptions  Medication Sig Dispense Refill  . amLODipine (NORVASC) 10 MG tablet Take 10 mg by mouth daily.      . benzonatate (TESSALON) 200 MG capsule Take 200 mg by mouth 3 (three) times daily as needed.       [name] dexamethasone (DECADRON) 4 MG tablet Take 10 tablets (40 mg total) by mouth once a week. Takes 40 mg Q Monday  40 tablet  2  . HYDROcodone-acetaminophen (NORCO) 10-325 MG per tablet Take 1-2 tablets by mouth every 6 (six) hours as needed for pain. For pain.  40 tablet  0  . nebivolol (BYSTOLIC) 5 MG tablet Take 5 mg by mouth daily.      [name] omeprazole (PRILOSEC) 40 MG capsule Take 40 mg by mouth daily as needed (for heartburn).       [name] oxyCODONE-acetaminophen (PERCOCET/ROXICET) 5-325 MG per tablet Take 1 tablet by mouth every 6 (six) hours as needed for pain.  40 tablet  0  . potassium chloride SA (K-DUR,KLOR-CON) 20 MEQ tablet Take 1 tablet (20 mEq total) by mouth as directed. daily x 7 days  7 tablet  0  . pregabalin (LYRICA) 50 MG capsule Take 1 capsule (50 mg total) by mouth 3 (three) times daily.  90 capsule  2  .  warfarin (COUMADIN) 5 MG tablet TAKE 7.5MG (1 AND 1/2) DAILY EXCEPT 5MG (1 TAB) ON TUES, THURS, AND SAT, OR AS DIRECTED  40 tablet  2   No current facility-administered medications for this visit.    SURGICAL HISTORY:  Past Surgical History  Procedure Laterality Date  . Video bronchoscopy  12/11/2012    Procedure: VIDEO BRONCHOSCOPY;  Surgeon: Kerin Perna, MD;  Location: [facility] OR;  Service: Thoracic;  Laterality: N/A;  . Video assisted thoracoscopy  12/11/2012    Procedure: VIDEO ASSISTED THORACOSCOPY;  Surgeon: Kerin Perna, MD;  Location: [facility] OR;  Service: Thoracic;  Laterality: Right;  . Decortication  12/11/2012    Procedure: DECORTICATION;  Surgeon: Kerin Perna, MD;  Location: [facility] OR;  Service: Thoracic;  Laterality: N/A;    REVIEW OF SYSTEMS:  A comprehensive review of systems was negative except for: Constitutional: positive for fatigue   PHYSICAL EXAMINATION: General appearance: alert, cooperative, fatigued and no distress Head: Normocephalic, without obvious abnormality, atraumatic Neck: no adenopathy Lymph nodes: Cervical, supraclavicular, and axillary nodes normal. Resp: clear to auscultation bilaterally Cardio: regular rate and rhythm, S1, S2 normal, no murmur, click, rub or gallop GI: soft, non-tender; bowel sounds normal; no masses,  no organomegaly Extremities: extremities normal, atraumatic, no cyanosis or edema Neurologic: Alert and oriented X 3, normal strength and tone. Normal symmetric reflexes. Normal coordination and gait  ECOG PERFORMANCE STATUS: 1 - Symptomatic but completely ambulatory  Blood pressure 152/84, pulse 79, temperature 97.9 F (36.6 C), temperature source Oral, resp. rate 18, height 5\' 3"  (1.6 m), weight 224 lb 14.4 oz (102.014 kg).  LABORATORY DATA: Lab Results  Component Value Date   WBC 5.2 07/12/2013   HGB 9.5* 07/12/2013   HCT 28.9* 07/12/2013   MCV 93.5 07/12/2013   PLT 133* 07/12/2013      Chemistry      Component Value Date/Time    NA 139 07/05/2013 0848   NA 136 03/16/2013 1930   K 3.2* 07/05/2013 0848   K 3.8 03/16/2013 1930   CL 112* 05/10/2013 0849   CL 110 03/16/2013 1930   CO2 23 07/05/2013 0848   CO2 16* 03/16/2013 1930   BUN 8.1 07/05/2013 0848   BUN 22 03/16/2013 1930   CREATININE 0.9 07/05/2013 0848   CREATININE 1.24 03/16/2013 1930      Component Value Date/Time   CALCIUM 8.5 07/05/2013 0848   CALCIUM 8.2* 03/16/2013 1930   ALKPHOS 30* 07/05/2013 0848   ALKPHOS 31* 03/16/2013 1930   AST 18 07/05/2013 0848   AST 11 03/16/2013 1930   ALT 12 07/05/2013 0848   ALT 15 03/16/2013 1930   BILITOT 0.67 07/05/2013 0848   BILITOT 0.2* 03/16/2013 1930       RADIOGRAPHIC STUDIES: Dg Chest 2 View  07/05/2013   *  RADIOLOGY REPORT*  Clinical Data: Cough.  History of multiple myeloma.  CHEST - 2 VIEW  Comparison: PA and lateral chest 02/05/2013 and 04/19/2013.  Findings: Port-A-Cath remains in place.  Lungs are clear.  No pneumothorax or pleural effusion.  Likely osteotomy right sixth rib is again seen.  IMPRESSION: No acute finding.   Original Report Authenticated By: Holley Dexter, M.D.    ASSESSMENT AND PLAN: This is a very pleasant 52 years old African American male with recurrent multiple myeloma currently on systemic chemotherapy with Carfilzomib, Cytoxan and dexamethasone status post 2 cycles with improvement in his disease. I discussed the myeloma panel with the patient and his wife. I recommended for him to proceed with cycle #3 today as scheduled. He would come back for followup visit in 2 weeks for evaluation and management any adverse effect of his treatment. The patient was given a refill for his pain medication today. He was advised to call immediately if he has any concerning symptoms in the interval. The patient voices understanding of current disease status and treatment options and is in agreement with the current care plan.  All questions were answered. The patient knows to call the clinic with any problems,  questions or concerns. We can certainly see the patient much sooner if necessary.  I spent 15 minutes counseling the patient face to face. The total time spent in the appointment was 25 minutes.

## 2013-07-12 NOTE — Patient Instructions (Signed)
Followup visit in 2 weeks. 

## 2013-07-13 ENCOUNTER — Ambulatory Visit (HOSPITAL_BASED_OUTPATIENT_CLINIC_OR_DEPARTMENT_OTHER): Payer: 59

## 2013-07-13 ENCOUNTER — Telehealth: Payer: Self-pay | Admitting: *Deleted

## 2013-07-13 DIAGNOSIS — E876 Hypokalemia: Secondary | ICD-10-CM

## 2013-07-13 DIAGNOSIS — C9000 Multiple myeloma not having achieved remission: Secondary | ICD-10-CM

## 2013-07-13 DIAGNOSIS — C9002 Multiple myeloma in relapse: Secondary | ICD-10-CM

## 2013-07-13 DIAGNOSIS — Z5112 Encounter for antineoplastic immunotherapy: Secondary | ICD-10-CM

## 2013-07-13 MED ORDER — POTASSIUM CHLORIDE CRYS ER 20 MEQ PO TBCR: 20.0000 meq | EXTENDED_RELEASE_TABLET | Freq: Every day | ORAL | Status: DC

## 2013-07-13 MED ORDER — HEPARIN SOD (PORK) LOCK FLUSH 100 UNIT/ML IV SOLN
500.0000 [IU] | Freq: Once | INTRAVENOUS | Status: AC | PRN
  Administered 2013-07-13: 500 [IU]
  Filled 2013-07-13: qty 5

## 2013-07-13 MED ORDER — DEXAMETHASONE SODIUM PHOSPHATE 10 MG/ML IJ SOLN
10.0000 mg | Freq: Once | INTRAMUSCULAR | Status: AC
  Administered 2013-07-13: 10 mg via INTRAVENOUS

## 2013-07-13 MED ORDER — SODIUM CHLORIDE 0.9 % IV SOLN
Freq: Once | INTRAVENOUS | Status: AC
  Administered 2013-07-13: 15:00:00 via INTRAVENOUS

## 2013-07-13 MED ORDER — ONDANSETRON 8 MG/50ML IVPB (CHCC)
8.0000 mg | Freq: Once | INTRAVENOUS | Status: AC
Start: 2013-07-13 — End: 2013-07-13
  Administered 2013-07-13: 8 mg via INTRAVENOUS

## 2013-07-13 MED ORDER — DEXTROSE 5 % IV SOLN
27.0000 mg/m2 | Freq: Once | INTRAVENOUS | Status: AC
  Administered 2013-07-13: 56 mg via INTRAVENOUS
  Filled 2013-07-13: qty 28

## 2013-07-13 MED ORDER — SODIUM CHLORIDE 0.9 % IJ SOLN
10.0000 mL | INTRAMUSCULAR | Status: DC | PRN
  Administered 2013-07-13: 10 mL
  Filled 2013-07-13: qty 10

## 2013-07-13 MED ORDER — SODIUM CHLORIDE 0.9 % IV SOLN: Freq: Once | INTRAVENOUS | Status: DC

## 2013-07-13 NOTE — Telephone Encounter (Signed)
Message copied by Caren Griffins on Tue Jul 13, 2013 11:12 AM ------      Message from: Si Gaul      Created: Mon Jul 12, 2013 12:59 PM       Call patient with the result and order K Dur 20 meq po Qd #30 ------

## 2013-07-13 NOTE — Telephone Encounter (Signed)
Pt verbalized understanding regarding taking 20 meq daily.

## 2013-07-13 NOTE — Patient Instructions (Signed)
[facility] Discharge Instructions for Patients Receiving Chemotherapy  Today you received the following chemotherapy agents Kryprolis  To help prevent nausea and vomiting after your treatment, we encourage you to take your nausea medication as directed   If you develop nausea and vomiting that is not controlled by your nausea medication, call the clinic.   BELOW ARE SYMPTOMS THAT SHOULD BE REPORTED IMMEDIATELY:  *FEVER GREATER THAN 100.5 F  *CHILLS WITH OR WITHOUT FEVER  NAUSEA AND VOMITING THAT IS NOT CONTROLLED WITH YOUR NAUSEA MEDICATION  *UNUSUAL SHORTNESS OF BREATH  *UNUSUAL BRUISING OR BLEEDING  TENDERNESS IN MOUTH AND THROAT WITH OR WITHOUT PRESENCE OF ULCERS  *URINARY PROBLEMS  *BOWEL PROBLEMS  UNUSUAL RASH Items with * indicate a potential emergency and should be followed up as soon as possible.  Feel free to call the clinic you have any questions or concerns. The clinic phone number is [phone].    

## 2013-07-20 ENCOUNTER — Other Ambulatory Visit (HOSPITAL_BASED_OUTPATIENT_CLINIC_OR_DEPARTMENT_OTHER): Payer: 59

## 2013-07-20 ENCOUNTER — Ambulatory Visit: Payer: 59 | Admitting: Pharmacist

## 2013-07-20 ENCOUNTER — Ambulatory Visit (HOSPITAL_BASED_OUTPATIENT_CLINIC_OR_DEPARTMENT_OTHER): Payer: 59

## 2013-07-20 ENCOUNTER — Other Ambulatory Visit: Payer: Self-pay | Admitting: *Deleted

## 2013-07-20 VITALS — BP 144/73 | HR 72 | Temp 98.5°F

## 2013-07-20 DIAGNOSIS — Z5112 Encounter for antineoplastic immunotherapy: Secondary | ICD-10-CM

## 2013-07-20 DIAGNOSIS — I776 Arteritis, unspecified: Secondary | ICD-10-CM

## 2013-07-20 DIAGNOSIS — C9002 Multiple myeloma in relapse: Secondary | ICD-10-CM

## 2013-07-20 DIAGNOSIS — C9000 Multiple myeloma not having achieved remission: Secondary | ICD-10-CM

## 2013-07-20 DIAGNOSIS — Z5111 Encounter for antineoplastic chemotherapy: Secondary | ICD-10-CM

## 2013-07-20 LAB — CBC WITH DIFFERENTIAL/PLATELET
BASO%: 0.4 % (ref 0.0–2.0)
EOS%: 0.6 % (ref 0.0–7.0)
HCT: 28.4 % — ABNORMAL LOW (ref 38.4–49.9)
MCHC: 33.5 g/dL (ref 32.0–36.0)
MONO#: 0.4 10*3/uL (ref 0.1–0.9)
NEUT%: 75.8 % — ABNORMAL HIGH (ref 39.0–75.0)
RBC: 3.07 10*6/uL — ABNORMAL LOW (ref 4.20–5.82)
RDW: 18.8 % — ABNORMAL HIGH (ref 11.0–14.6)
WBC: 5.3 10*3/uL (ref 4.0–10.3)
lymph#: 0.8 10*3/uL — ABNORMAL LOW (ref 0.9–3.3)
nRBC: 2 % — ABNORMAL HIGH (ref 0–0)

## 2013-07-20 LAB — PROTIME-INR
INR: 3 (ref 2.00–3.50)
Protime: 36 Seconds — ABNORMAL HIGH (ref 10.6–13.4)

## 2013-07-20 LAB — COMPREHENSIVE METABOLIC PANEL (CC13)
ALT: 10 U/L (ref 0–55)
AST: 11 U/L (ref 5–34)
Alkaline Phosphatase: 31 U/L — ABNORMAL LOW (ref 40–150)
BUN: 8.8 mg/dL (ref 7.0–26.0)
Creatinine: 0.8 mg/dL (ref 0.7–1.3)
Total Bilirubin: 0.75 mg/dL (ref 0.20–1.20)

## 2013-07-20 LAB — POCT INR: INR: 3

## 2013-07-20 MED ORDER — SODIUM CHLORIDE 0.9 % IV SOLN
Freq: Once | INTRAVENOUS | Status: AC
  Administered 2013-07-20: 09:00:00 via INTRAVENOUS

## 2013-07-20 MED ORDER — DEXAMETHASONE SODIUM PHOSPHATE 10 MG/ML IJ SOLN
10.0000 mg | Freq: Once | INTRAMUSCULAR | Status: AC
  Administered 2013-07-20: 10 mg via INTRAVENOUS

## 2013-07-20 MED ORDER — DEXTROSE 5 % IV SOLN
27.0000 mg/m2 | Freq: Once | INTRAVENOUS | Status: AC
  Administered 2013-07-20: 56 mg via INTRAVENOUS
  Filled 2013-07-20: qty 28

## 2013-07-20 MED ORDER — ONDANSETRON 8 MG/50ML IVPB (CHCC)
8.0000 mg | Freq: Once | INTRAVENOUS | Status: AC
  Administered 2013-07-20: 8 mg via INTRAVENOUS

## 2013-07-20 MED ORDER — SODIUM CHLORIDE 0.9 % IJ SOLN
10.0000 mL | INTRAMUSCULAR | Status: DC | PRN
  Administered 2013-07-20: 10 mL
  Filled 2013-07-20: qty 10

## 2013-07-20 MED ORDER — SODIUM CHLORIDE 0.9 % IV SOLN: Freq: Once | INTRAVENOUS | Status: DC

## 2013-07-20 MED ORDER — HEPARIN SOD (PORK) LOCK FLUSH 100 UNIT/ML IV SOLN
500.0000 [IU] | Freq: Once | INTRAVENOUS | Status: AC | PRN
  Administered 2013-07-20: 500 [IU]
  Filled 2013-07-20: qty 5

## 2013-07-20 MED ORDER — SODIUM CHLORIDE 0.9 % IV SOLN
300.0000 mg/m2 | Freq: Once | INTRAVENOUS | Status: AC
  Administered 2013-07-20: 640 mg via INTRAVENOUS
  Filled 2013-07-20: qty 32

## 2013-07-20 NOTE — Patient Instructions (Addendum)
[facility] Discharge Instructions for Patients Receiving Chemotherapy  Today you received the following chemotherapy agents Kyprolis/Cytoxan.  To help prevent nausea and vomiting after your treatment, we encourage you to take your nausea medication as prescribed.   If you develop nausea and vomiting that is not controlled by your nausea medication, call the clinic.   BELOW ARE SYMPTOMS THAT SHOULD BE REPORTED IMMEDIATELY:  *FEVER GREATER THAN 100.5 F  *CHILLS WITH OR WITHOUT FEVER  NAUSEA AND VOMITING THAT IS NOT CONTROLLED WITH YOUR NAUSEA MEDICATION  *UNUSUAL SHORTNESS OF BREATH  *UNUSUAL BRUISING OR BLEEDING  TENDERNESS IN MOUTH AND THROAT WITH OR WITHOUT PRESENCE OF ULCERS  *URINARY PROBLEMS  *BOWEL PROBLEMS  UNUSUAL RASH Items with * indicate a potential emergency and should be followed up as soon as possible.  Feel free to call the clinic you have any questions or concerns. The clinic phone number is [phone].    

## 2013-07-20 NOTE — Progress Notes (Signed)
INR = 3 on Coumadin 5 mg/day; 7.5 mg on Mondays. Pt doing ok re: anticoag.  No nosebleeds. INR at goal.  No change to dose. Recheck INR in 3 weeks w/ labs scheduled. Ebony Hail, Pharm.D., CPP 07/20/2013@8 :50 AM

## 2013-07-21 ENCOUNTER — Ambulatory Visit (HOSPITAL_BASED_OUTPATIENT_CLINIC_OR_DEPARTMENT_OTHER): Payer: 59

## 2013-07-21 VITALS — BP 163/84 | HR 65 | Temp 97.4°F

## 2013-07-21 DIAGNOSIS — Z5112 Encounter for antineoplastic immunotherapy: Secondary | ICD-10-CM

## 2013-07-21 DIAGNOSIS — C9000 Multiple myeloma not having achieved remission: Secondary | ICD-10-CM

## 2013-07-21 DIAGNOSIS — C9002 Multiple myeloma in relapse: Secondary | ICD-10-CM

## 2013-07-21 MED ORDER — SODIUM CHLORIDE 0.9 % IJ SOLN
10.0000 mL | INTRAMUSCULAR | Status: DC | PRN
  Administered 2013-07-21: 10 mL
  Filled 2013-07-21: qty 10

## 2013-07-21 MED ORDER — DEXAMETHASONE SODIUM PHOSPHATE 10 MG/ML IJ SOLN
10.0000 mg | Freq: Once | INTRAMUSCULAR | Status: AC
  Administered 2013-07-21: 10 mg via INTRAVENOUS

## 2013-07-21 MED ORDER — SODIUM CHLORIDE 0.9 % IV SOLN
Freq: Once | INTRAVENOUS | Status: AC
  Administered 2013-07-21: 08:00:00 via INTRAVENOUS

## 2013-07-21 MED ORDER — DEXTROSE 5 % IV SOLN
27.0000 mg/m2 | Freq: Once | INTRAVENOUS | Status: AC
  Administered 2013-07-21: 56 mg via INTRAVENOUS
  Filled 2013-07-21: qty 28

## 2013-07-21 MED ORDER — SODIUM CHLORIDE 0.9 % IV SOLN: Freq: Once | INTRAVENOUS | Status: DC

## 2013-07-21 MED ORDER — ONDANSETRON 8 MG/50ML IVPB (CHCC)
8.0000 mg | Freq: Once | INTRAVENOUS | Status: AC
  Administered 2013-07-21: 8 mg via INTRAVENOUS

## 2013-07-21 MED ORDER — HEPARIN SOD (PORK) LOCK FLUSH 100 UNIT/ML IV SOLN
500.0000 [IU] | Freq: Once | INTRAVENOUS | Status: AC | PRN
  Administered 2013-07-21: 500 [IU]
  Filled 2013-07-21: qty 5

## 2013-07-21 NOTE — Patient Instructions (Addendum)
[facility] Discharge Instructions for Patients Receiving Chemotherapy  Today you received the following chemotherapy agents Kyprolis.  To help prevent nausea and vomiting after your treatment, we encourage you to take your nausea medication.   If you develop nausea and vomiting that is not controlled by your nausea medication, call the clinic.   BELOW ARE SYMPTOMS THAT SHOULD BE REPORTED IMMEDIATELY:  *FEVER GREATER THAN 100.5 F  *CHILLS WITH OR WITHOUT FEVER  NAUSEA AND VOMITING THAT IS NOT CONTROLLED WITH YOUR NAUSEA MEDICATION  *UNUSUAL SHORTNESS OF BREATH  *UNUSUAL BRUISING OR BLEEDING  TENDERNESS IN MOUTH AND THROAT WITH OR WITHOUT PRESENCE OF ULCERS  *URINARY PROBLEMS  *BOWEL PROBLEMS  UNUSUAL RASH Items with * indicate a potential emergency and should be followed up as soon as possible.  Feel free to call the clinic you have any questions or concerns. The clinic phone number is [phone].    

## 2013-07-26 ENCOUNTER — Ambulatory Visit (HOSPITAL_BASED_OUTPATIENT_CLINIC_OR_DEPARTMENT_OTHER): Payer: 59

## 2013-07-26 ENCOUNTER — Telehealth: Payer: Self-pay | Admitting: Internal Medicine

## 2013-07-26 ENCOUNTER — Ambulatory Visit (HOSPITAL_BASED_OUTPATIENT_CLINIC_OR_DEPARTMENT_OTHER): Payer: 59 | Admitting: Physician Assistant

## 2013-07-26 ENCOUNTER — Other Ambulatory Visit (HOSPITAL_BASED_OUTPATIENT_CLINIC_OR_DEPARTMENT_OTHER): Payer: 59 | Admitting: Lab

## 2013-07-26 VITALS — BP 152/78 | HR 90 | Temp 98.9°F | Resp 20 | Ht 63.0 in | Wt 230.2 lb

## 2013-07-26 DIAGNOSIS — Z5111 Encounter for antineoplastic chemotherapy: Secondary | ICD-10-CM

## 2013-07-26 DIAGNOSIS — R5381 Other malaise: Secondary | ICD-10-CM

## 2013-07-26 DIAGNOSIS — G629 Polyneuropathy, unspecified: Secondary | ICD-10-CM

## 2013-07-26 DIAGNOSIS — C9002 Multiple myeloma in relapse: Secondary | ICD-10-CM

## 2013-07-26 DIAGNOSIS — C9000 Multiple myeloma not having achieved remission: Secondary | ICD-10-CM

## 2013-07-26 DIAGNOSIS — G8918 Other acute postprocedural pain: Secondary | ICD-10-CM

## 2013-07-26 DIAGNOSIS — Z5112 Encounter for antineoplastic immunotherapy: Secondary | ICD-10-CM

## 2013-07-26 LAB — CBC WITH DIFFERENTIAL/PLATELET
Basophils Absolute: 0 10*3/uL (ref 0.0–0.1)
EOS%: 0.6 % (ref 0.0–7.0)
HCT: 26.3 % — ABNORMAL LOW (ref 38.4–49.9)
HGB: 8.8 g/dL — ABNORMAL LOW (ref 13.0–17.1)
MCH: 31.3 pg (ref 27.2–33.4)
NEUT%: 77.1 % — ABNORMAL HIGH (ref 39.0–75.0)
Platelets: 59 10*3/uL — ABNORMAL LOW (ref 140–400)
lymph#: 0.6 10*3/uL — ABNORMAL LOW (ref 0.9–3.3)

## 2013-07-26 LAB — COMPREHENSIVE METABOLIC PANEL (CC13)
Albumin: 2.2 g/dL — ABNORMAL LOW (ref 3.5–5.0)
BUN: 7.1 mg/dL (ref 7.0–26.0)
Calcium: 8.5 mg/dL (ref 8.4–10.4)
Chloride: 107 mEq/L (ref 98–109)
Glucose: 84 mg/dl (ref 70–140)
Potassium: 3.3 mEq/L — ABNORMAL LOW (ref 3.5–5.1)

## 2013-07-26 MED ORDER — SODIUM CHLORIDE 0.9 % IV SOLN
300.0000 mg/m2 | Freq: Once | INTRAVENOUS | Status: AC
  Administered 2013-07-26: 640 mg via INTRAVENOUS
  Filled 2013-07-26: qty 32

## 2013-07-26 MED ORDER — DEXAMETHASONE SODIUM PHOSPHATE 10 MG/ML IJ SOLN
10.0000 mg | Freq: Once | INTRAMUSCULAR | Status: AC
  Administered 2013-07-26: 10 mg via INTRAVENOUS

## 2013-07-26 MED ORDER — ONDANSETRON 8 MG/NS 50 ML IVPB
INTRAVENOUS | Status: AC
  Filled 2013-07-26: qty 8

## 2013-07-26 MED ORDER — HEPARIN SOD (PORK) LOCK FLUSH 100 UNIT/ML IV SOLN
500.0000 [IU] | Freq: Once | INTRAVENOUS | Status: AC | PRN
  Administered 2013-07-26: 500 [IU]
  Filled 2013-07-26: qty 5

## 2013-07-26 MED ORDER — ONDANSETRON 8 MG/50ML IVPB (CHCC)
8.0000 mg | Freq: Once | INTRAVENOUS | Status: AC
  Administered 2013-07-26: 8 mg via INTRAVENOUS

## 2013-07-26 MED ORDER — SODIUM CHLORIDE 0.9 % IV SOLN
Freq: Once | INTRAVENOUS | Status: AC
  Administered 2013-07-26: 13:00:00 via INTRAVENOUS

## 2013-07-26 MED ORDER — DEXAMETHASONE SODIUM PHOSPHATE 10 MG/ML IJ SOLN
INTRAMUSCULAR | Status: AC
  Filled 2013-07-26: qty 1

## 2013-07-26 MED ORDER — DEXTROSE 5 % IV SOLN
27.0000 mg/m2 | Freq: Once | INTRAVENOUS | Status: AC
  Administered 2013-07-26: 56 mg via INTRAVENOUS
  Filled 2013-07-26: qty 28

## 2013-07-26 MED ORDER — SODIUM CHLORIDE 0.9 % IJ SOLN
10.0000 mL | INTRAMUSCULAR | Status: DC | PRN
  Administered 2013-07-26: 10 mL
  Filled 2013-07-26: qty 10

## 2013-07-26 NOTE — Patient Instructions (Signed)
[facility] Discharge Instructions for Patients Receiving Chemotherapy  Today you received the following chemotherapy agents Cytoxan/Kyprolis.  To help prevent nausea and vomiting after your treatment, we encourage you to take your nausea medication as prescribed.   If you develop nausea and vomiting that is not controlled by your nausea medication, call the clinic.   BELOW ARE SYMPTOMS THAT SHOULD BE REPORTED IMMEDIATELY:  *FEVER GREATER THAN 100.5 F  *CHILLS WITH OR WITHOUT FEVER  NAUSEA AND VOMITING THAT IS NOT CONTROLLED WITH YOUR NAUSEA MEDICATION  *UNUSUAL SHORTNESS OF BREATH  *UNUSUAL BRUISING OR BLEEDING  TENDERNESS IN MOUTH AND THROAT WITH OR WITHOUT PRESENCE OF ULCERS  *URINARY PROBLEMS  *BOWEL PROBLEMS  UNUSUAL RASH Items with * indicate a potential emergency and should be followed up as soon as possible.  Feel free to call the clinic you have any questions or concerns. The clinic phone number is [phone].    

## 2013-07-26 NOTE — Telephone Encounter (Signed)
gv pt appt schedule for September and October.  °

## 2013-07-26 NOTE — Progress Notes (Signed)
Ok to treat with platelets 59 per [name], PA.

## 2013-07-27 ENCOUNTER — Ambulatory Visit (HOSPITAL_BASED_OUTPATIENT_CLINIC_OR_DEPARTMENT_OTHER): Payer: 59

## 2013-07-27 ENCOUNTER — Encounter: Payer: Self-pay | Admitting: Physician Assistant

## 2013-07-27 VITALS — BP 152/83 | HR 71 | Temp 98.6°F

## 2013-07-27 DIAGNOSIS — C9000 Multiple myeloma not having achieved remission: Secondary | ICD-10-CM

## 2013-07-27 DIAGNOSIS — C9002 Multiple myeloma in relapse: Secondary | ICD-10-CM

## 2013-07-27 DIAGNOSIS — Z5112 Encounter for antineoplastic immunotherapy: Secondary | ICD-10-CM

## 2013-07-27 MED ORDER — SODIUM CHLORIDE 0.9 % IV SOLN
Freq: Once | INTRAVENOUS | Status: AC
  Administered 2013-07-27: 14:00:00 via INTRAVENOUS

## 2013-07-27 MED ORDER — HEPARIN SOD (PORK) LOCK FLUSH 100 UNIT/ML IV SOLN
500.0000 [IU] | Freq: Once | INTRAVENOUS | Status: AC | PRN
  Administered 2013-07-27: 500 [IU]
  Filled 2013-07-27: qty 5

## 2013-07-27 MED ORDER — SODIUM CHLORIDE 0.9 % IJ SOLN
10.0000 mL | INTRAMUSCULAR | Status: AC | PRN
  Administered 2013-07-27: 10 mL
  Filled 2013-07-27: qty 10

## 2013-07-27 MED ORDER — SODIUM CHLORIDE 0.9 % IV SOLN
Freq: Once | INTRAVENOUS | Status: AC
  Administered 2013-07-27: 15:00:00 via INTRAVENOUS

## 2013-07-27 MED ORDER — DEXAMETHASONE SODIUM PHOSPHATE 10 MG/ML IJ SOLN
10.0000 mg | Freq: Once | INTRAMUSCULAR | Status: AC
  Administered 2013-07-27: 10 mg via INTRAVENOUS

## 2013-07-27 MED ORDER — DEXTROSE 5 % IV SOLN
27.0000 mg/m2 | Freq: Once | INTRAVENOUS | Status: AC
  Administered 2013-07-27: 56 mg via INTRAVENOUS
  Filled 2013-07-27: qty 28

## 2013-07-27 MED ORDER — ONDANSETRON 8 MG/50ML IVPB (CHCC)
8.0000 mg | Freq: Once | INTRAVENOUS | Status: AC
  Administered 2013-07-27: 8 mg via INTRAVENOUS

## 2013-07-27 MED ORDER — ONDANSETRON 8 MG/NS 50 ML IVPB
INTRAVENOUS | Status: AC
  Filled 2013-07-27: qty 8

## 2013-07-27 MED ORDER — DEXAMETHASONE SODIUM PHOSPHATE 10 MG/ML IJ SOLN
INTRAMUSCULAR | Status: AC
  Filled 2013-07-27: qty 1

## 2013-07-27 NOTE — Patient Instructions (Addendum)
[facility] Discharge Instructions for Patients Receiving Chemotherapy  Today you received the following chemotherapy agents:  kyprolis  To help prevent nausea and vomiting after your treatment, we encourage you to take your nausea medication   If you develop nausea and vomiting that is not controlled by your nausea medication, call the clinic.   BELOW ARE SYMPTOMS THAT SHOULD BE REPORTED IMMEDIATELY:  *FEVER GREATER THAN 100.5 F  *CHILLS WITH OR WITHOUT FEVER  NAUSEA AND VOMITING THAT IS NOT CONTROLLED WITH YOUR NAUSEA MEDICATION  *UNUSUAL SHORTNESS OF BREATH  *UNUSUAL BRUISING OR BLEEDING  TENDERNESS IN MOUTH AND THROAT WITH OR WITHOUT PRESENCE OF ULCERS  *URINARY PROBLEMS  *BOWEL PROBLEMS  UNUSUAL RASH Items with * indicate a potential emergency and should be followed up as soon as possible.  Feel free to call the clinic you have any questions or concerns. The clinic phone number is [phone].    

## 2013-07-27 NOTE — Progress Notes (Signed)
[facility] [phone]   [phone]) [phone]  SHARED VISIT PROGRESS NOTE  [name], MD [address] [place] [address]  Principle Diagnosis:  #1 recurrent multiple myeloma IgG kappa subtype diagnosed in June of 2008  #2 history of vasculitis and thrombosis of skin lesions   Prior Therapy:  #1 status post palliative radiotherapy to the left hip under the care of Dr. Roselind Messier  #2 status post 5 cycles of systemic chemotherapy with Revlimid and low dose Decadron. Last dose given June 2009 with good response.  #3 status post autologous peripheral blood stem cell transplant at [facility] on 02/02/2008.  #4 the patient had evidence for disease recurrence in December 2010.  #5 Revlimid 25 mg by mouth daily for 21 days every 4 weeks in addition to Decadron 40 mg orally on a weekly basis. The patient is status post 28 cycles, discontinued today secondary to disease progression.  #6 Systemic chemotherapy with Velcade 1.3 mg/M2 on days 1, 4, 8 and 11 in addition to Doxil 30 mg/M2 on day 4 and Decadron 40 mg by mouth on weekly basis every 3 weeks. Status post 3 cycles, last dose was given 05/04/2012 discontinued secondary to intolerance.  #7 Salvage therapy treatment with the Pomalyst 4 mg by mouth daily for 21 days every 28 days as well as Cytoxan 50 mg by mouth every other day and dexamethasone 40 mg by mouth once weekly. Therapy beginning 09/25/2012, status post 2 cycles discontinued secondary to disease progression and intolerance.  #8 Kyprolis (Carfilzomib) 27 mg/M2 on days 1, 2, 8, 9, 15 and 16 every 4 weeks. First dose on 02/08/2013. This would be concurrent with the dexamethasone 40 mg by mouth on a weekly basis. Status post 3 cycles with disease progression.   Current therapy:  1) Systemic chemotherapy with Carfilzomib 27 mg/M2 days 1,2 , 8, 9, 15 and 16, Cytoxan 300 mg/M2 and dexamethasone 40 mg by mouth weekly every 4 weeks.  Status post 2 cycles, and days 1,2,8,9 of cycle #3 2) Zometa 4 mg IV given every 3 months.    INTERVAL HISTORY: [name] 52 y.o. male returns to the clinic today for followup visit accompanied his wife. Today he feels well and voices no specific complaints.  He denied having any significant weakness but continues to have mild fatigue. He has no chest pain, shortness of breath, cough or hemoptysis. The patient denied having any significant fever or chills, no weight loss or night sweats. He presents to proceed with cycle 3 day 15 today of his treatment with Kyprolis.  MEDICAL HISTORY: Past Medical History  Diagnosis Date  . Multiple myeloma 09/24/2011  . Hypertension     ALLERGIES:  is allergic to red dye and other.  MEDICATIONS:  Current Outpatient Prescriptions  Medication Sig Dispense Refill  . amLODipine (NORVASC) 10 MG tablet Take 10 mg by mouth daily.      . benzonatate (TESSALON) 200 MG capsule Take 200 mg by mouth 3 (three) times daily as needed.       [name] dexamethasone (DECADRON) 4 MG tablet Take 10 tablets (40 mg total) by mouth once a week. Takes 40 mg Q Monday  40 tablet  2  . HYDROcodone-acetaminophen (NORCO) 10-325 MG per tablet Take 1-2 tablets by mouth every 6 (six) hours as needed for pain. For pain.  40 tablet  0  . nebivolol (BYSTOLIC) 5 MG tablet Take 5 mg by mouth daily.      [name]  omeprazole (PRILOSEC) 40 MG capsule Take 40 mg by mouth daily as needed (for heartburn).       [name] oxyCODONE-acetaminophen (PERCOCET/ROXICET) 5-325 MG per tablet Take 1 tablet by mouth every 6 (six) hours as needed for pain.  40 tablet  0  . potassium chloride SA (K-DUR,KLOR-CON) 20 MEQ tablet Take 1 tablet (20 mEq total) by mouth as directed. daily x 7 days  7 tablet  0  . potassium chloride SA (K-DUR,KLOR-CON) 20 MEQ tablet Take 1 tablet (20 mEq total) by mouth daily.  30 tablet  0  . pregabalin (LYRICA) 50 MG capsule Take 1 capsule (50 mg total) by mouth 3 (three) times daily.  90  capsule  2  . warfarin (COUMADIN) 5 MG tablet TAKE 7.5MG (1 AND 1/2) DAILY EXCEPT 5MG (1 TAB) ON TUES, THURS, AND SAT, OR AS DIRECTED  40 tablet  2   No current facility-administered medications for this visit.    SURGICAL HISTORY:  Past Surgical History  Procedure Laterality Date  . Video bronchoscopy  12/11/2012    Procedure: VIDEO BRONCHOSCOPY;  Surgeon: Kerin Perna, MD;  Location: [facility] OR;  Service: Thoracic;  Laterality: N/A;  . Video assisted thoracoscopy  12/11/2012    Procedure: VIDEO ASSISTED THORACOSCOPY;  Surgeon: Kerin Perna, MD;  Location: [facility] OR;  Service: Thoracic;  Laterality: Right;  . Decortication  12/11/2012    Procedure: DECORTICATION;  Surgeon: Kerin Perna, MD;  Location: [facility] OR;  Service: Thoracic;  Laterality: N/A;    REVIEW OF SYSTEMS:  A comprehensive review of systems was negative except for: Constitutional: positive for fatigue   PHYSICAL EXAMINATION: General appearance: alert, cooperative, fatigued and no distress Head: Normocephalic, without obvious abnormality, atraumatic Neck: no adenopathy Lymph nodes: Cervical, supraclavicular, and axillary nodes normal. Resp: clear to auscultation bilaterally Cardio: regular rate and rhythm, S1, S2 normal, no murmur, click, rub or gallop GI: soft, non-tender; bowel sounds normal; no masses,  no organomegaly Extremities: extremities normal, atraumatic, no cyanosis or edema Neurologic: Alert and oriented X 3, normal strength and tone. Normal symmetric reflexes. Normal coordination and gait Skin: Generally dry, there were no areas of skin breakdown  ECOG PERFORMANCE STATUS: 1 - Symptomatic but completely ambulatory  Blood pressure 152/78, pulse 90, temperature 98.9 F (37.2 C), temperature source Oral, resp. rate 20, height 5\' 3"  (1.6 m), weight 230 lb 3.2 oz (104.418 kg).  LABORATORY DATA: Lab Results  Component Value Date   WBC 4.8 07/26/2013   HGB 8.8* 07/26/2013   HCT 26.3* 07/26/2013   MCV 93.6 07/26/2013    PLT 59* 07/26/2013      Chemistry      Component Value Date/Time   NA 138 07/26/2013 1055   NA 136 03/16/2013 1930   K 3.3* 07/26/2013 1055   K 3.8 03/16/2013 1930   CL 112* 05/10/2013 0849   CL 110 03/16/2013 1930   CO2 25 07/26/2013 1055   CO2 16* 03/16/2013 1930   BUN 7.1 07/26/2013 1055   BUN 22 03/16/2013 1930   CREATININE 0.8 07/26/2013 1055   CREATININE 1.24 03/16/2013 1930      Component Value Date/Time   CALCIUM 8.5 07/26/2013 1055   CALCIUM 8.2* 03/16/2013 1930   ALKPHOS 32* 07/26/2013 1055   ALKPHOS 31* 03/16/2013 1930   AST 10 07/26/2013 1055   AST 11 03/16/2013 1930   ALT 14 07/26/2013 1055   ALT 15 03/16/2013 1930   BILITOT 0.67 07/26/2013 1055   BILITOT 0.2* 03/16/2013 1930  RADIOGRAPHIC STUDIES: Dg Chest 2 View  07/05/2013   *RADIOLOGY REPORT*  Clinical Data: Cough.  History of multiple myeloma.  CHEST - 2 VIEW  Comparison: PA and lateral chest 02/05/2013 and 04/19/2013.  Findings: Port-A-Cath remains in place.  Lungs are clear.  No pneumothorax or pleural effusion.  Likely osteotomy right sixth rib is again seen.  IMPRESSION: No acute finding.   Original Report Authenticated By: Holley Dexter, M.D.    ASSESSMENT AND PLAN: This is a very pleasant 52 years old African American male with recurrent multiple myeloma currently on systemic chemotherapy with Carfilzomib, Cytoxan and dexamethasone status post 2 cycles with improvement in his disease. He is also completed days 1, 2, 8 and 9 of cycle 3. Patient was discussed with an also seen by Dr. Arbutus Ped. He will proceed with day 15 of cycle 3 of his systemic chemotherapy with Carfilzomib, Cytoxan and dexamethasone today as scheduled. He'll return in 2 weeks at the start of cycle #4 for another symptom management visit.  Laural Benes, Yukari Flax E, PA-C    He was advised to call immediately if he has any concerning symptoms in the interval. The patient voices understanding of current disease status and treatment options and is in agreement with  the current care plan.  All questions were answered. The patient knows to call the clinic with any problems, questions or concerns. We can certainly see the patient much sooner if necessary.  ADDENDUM: Hematology/Oncology Attending: I have a face to face encounter with the patient. I recommended his care plan. The patient is currently undergoing treatment with Carfilzomib, Cytoxan and Decadron for recurrent multiple myeloma. He is tolerating his treatment fairly well. We will proceed with day 15 and 16 of the third cycle as scheduled. The patient would come back for followup visit in 2 weeks with the start of cycle #4. He was advised to call immediately if he has any concerning symptoms in the interval. Lajuana Matte., MD 07/27/2013

## 2013-07-27 NOTE — Patient Instructions (Addendum)
Continue with your labs and chemotherapy as scheduled Followup in 2 weeks

## 2013-07-30 ENCOUNTER — Other Ambulatory Visit: Payer: Self-pay | Admitting: Certified Registered Nurse Anesthetist

## 2013-08-02 ENCOUNTER — Other Ambulatory Visit (HOSPITAL_BASED_OUTPATIENT_CLINIC_OR_DEPARTMENT_OTHER): Payer: 59

## 2013-08-02 DIAGNOSIS — C9002 Multiple myeloma in relapse: Secondary | ICD-10-CM

## 2013-08-02 LAB — CBC WITH DIFFERENTIAL/PLATELET
Eosinophils Absolute: 0 10*3/uL (ref 0.0–0.5)
LYMPH%: 12.8 % — ABNORMAL LOW (ref 14.0–49.0)
MONO#: 0.5 10*3/uL (ref 0.1–0.9)
NEUT#: 2.2 10*3/uL (ref 1.5–6.5)
Platelets: 77 10*3/uL — ABNORMAL LOW (ref 140–400)
RBC: 2.58 10*6/uL — ABNORMAL LOW (ref 4.20–5.82)
WBC: 3.1 10*3/uL — ABNORMAL LOW (ref 4.0–10.3)
lymph#: 0.4 10*3/uL — ABNORMAL LOW (ref 0.9–3.3)
nRBC: 6 % — ABNORMAL HIGH (ref 0–0)

## 2013-08-02 LAB — COMPREHENSIVE METABOLIC PANEL (CC13)
ALT: 11 U/L (ref 0–55)
Albumin: 2.2 g/dL — ABNORMAL LOW (ref 3.5–5.0)
CO2: 20 mEq/L — ABNORMAL LOW (ref 22–29)
Calcium: 8.5 mg/dL (ref 8.4–10.4)
Chloride: 110 mEq/L — ABNORMAL HIGH (ref 98–109)
Creatinine: 0.8 mg/dL (ref 0.7–1.3)
Potassium: 3.7 mEq/L (ref 3.5–5.1)
Sodium: 137 mEq/L (ref 136–145)
Total Protein: 9.8 g/dL — ABNORMAL HIGH (ref 6.4–8.3)

## 2013-08-02 LAB — TECHNOLOGIST REVIEW

## 2013-08-09 ENCOUNTER — Ambulatory Visit (HOSPITAL_BASED_OUTPATIENT_CLINIC_OR_DEPARTMENT_OTHER): Payer: 59

## 2013-08-09 ENCOUNTER — Encounter: Payer: Self-pay | Admitting: Physician Assistant

## 2013-08-09 ENCOUNTER — Encounter: Payer: Self-pay | Admitting: Internal Medicine

## 2013-08-09 ENCOUNTER — Ambulatory Visit (HOSPITAL_BASED_OUTPATIENT_CLINIC_OR_DEPARTMENT_OTHER): Payer: 59 | Admitting: Physician Assistant

## 2013-08-09 ENCOUNTER — Telehealth: Payer: Self-pay | Admitting: Internal Medicine

## 2013-08-09 ENCOUNTER — Telehealth: Payer: Self-pay | Admitting: *Deleted

## 2013-08-09 ENCOUNTER — Other Ambulatory Visit (HOSPITAL_BASED_OUTPATIENT_CLINIC_OR_DEPARTMENT_OTHER): Payer: 59 | Admitting: Lab

## 2013-08-09 ENCOUNTER — Ambulatory Visit: Payer: 59 | Admitting: Pharmacist

## 2013-08-09 VITALS — BP 146/72 | HR 84 | Temp 98.9°F | Resp 18 | Ht 63.0 in | Wt 229.1 lb

## 2013-08-09 DIAGNOSIS — C9002 Multiple myeloma in relapse: Secondary | ICD-10-CM

## 2013-08-09 DIAGNOSIS — R5381 Other malaise: Secondary | ICD-10-CM

## 2013-08-09 DIAGNOSIS — Z5112 Encounter for antineoplastic immunotherapy: Secondary | ICD-10-CM

## 2013-08-09 DIAGNOSIS — C9000 Multiple myeloma not having achieved remission: Secondary | ICD-10-CM

## 2013-08-09 DIAGNOSIS — I776 Arteritis, unspecified: Secondary | ICD-10-CM

## 2013-08-09 DIAGNOSIS — D6481 Anemia due to antineoplastic chemotherapy: Secondary | ICD-10-CM

## 2013-08-09 DIAGNOSIS — Z5111 Encounter for antineoplastic chemotherapy: Secondary | ICD-10-CM

## 2013-08-09 LAB — COMPREHENSIVE METABOLIC PANEL (CC13)
ALT: 10 U/L (ref 0–55)
Albumin: 2.3 g/dL — ABNORMAL LOW (ref 3.5–5.0)
Alkaline Phosphatase: 33 U/L — ABNORMAL LOW (ref 40–150)
BUN: 11.3 mg/dL (ref 7.0–26.0)
Chloride: 111 mEq/L — ABNORMAL HIGH (ref 98–109)
Glucose: 78 mg/dl (ref 70–140)
Potassium: 3.6 mEq/L (ref 3.5–5.1)
Sodium: 137 mEq/L (ref 136–145)
Total Bilirubin: 0.64 mg/dL (ref 0.20–1.20)
Total Protein: 10 g/dL — ABNORMAL HIGH (ref 6.4–8.3)

## 2013-08-09 LAB — CBC WITH DIFFERENTIAL/PLATELET
BASO%: 0.4 % (ref 0.0–2.0)
EOS%: 0.8 % (ref 0.0–7.0)
MCH: 31.2 pg (ref 27.2–33.4)
MCHC: 32.1 g/dL (ref 32.0–36.0)
MCV: 96.9 fL (ref 79.3–98.0)
MONO%: 9.8 % (ref 0.0–14.0)
RBC: 2.6 10*6/uL — ABNORMAL LOW (ref 4.20–5.82)
RDW: 20 % — ABNORMAL HIGH (ref 11.0–14.6)
lymph#: 0.6 10*3/uL — ABNORMAL LOW (ref 0.9–3.3)

## 2013-08-09 LAB — PROTIME-INR: Protime: 32.4 Seconds — ABNORMAL HIGH (ref 10.6–13.4)

## 2013-08-09 MED ORDER — SODIUM CHLORIDE 0.9 % IJ SOLN
10.0000 mL | INTRAMUSCULAR | Status: DC | PRN
  Administered 2013-08-09: 10 mL
  Filled 2013-08-09: qty 10

## 2013-08-09 MED ORDER — HEPARIN SOD (PORK) LOCK FLUSH 100 UNIT/ML IV SOLN
500.0000 [IU] | Freq: Once | INTRAVENOUS | Status: AC | PRN
  Administered 2013-08-09: 500 [IU]
  Filled 2013-08-09: qty 5

## 2013-08-09 MED ORDER — DEXAMETHASONE SODIUM PHOSPHATE 10 MG/ML IJ SOLN
INTRAMUSCULAR | Status: AC
  Filled 2013-08-09: qty 1

## 2013-08-09 MED ORDER — SODIUM CHLORIDE 0.9 % IV SOLN
Freq: Once | INTRAVENOUS | Status: AC
  Administered 2013-08-09: 13:00:00 via INTRAVENOUS

## 2013-08-09 MED ORDER — CARFILZOMIB CHEMO INJECTION 60 MG
27.0000 mg/m2 | Freq: Once | INTRAVENOUS | Status: AC
  Administered 2013-08-09: 56 mg via INTRAVENOUS
  Filled 2013-08-09: qty 28

## 2013-08-09 MED ORDER — ONDANSETRON 8 MG/50ML IVPB (CHCC)
8.0000 mg | Freq: Once | INTRAVENOUS | Status: AC
  Administered 2013-08-09: 8 mg via INTRAVENOUS

## 2013-08-09 MED ORDER — DEXAMETHASONE SODIUM PHOSPHATE 10 MG/ML IJ SOLN
10.0000 mg | Freq: Once | INTRAMUSCULAR | Status: AC
  Administered 2013-08-09: 10 mg via INTRAVENOUS

## 2013-08-09 MED ORDER — SODIUM CHLORIDE 0.9 % IV SOLN
300.0000 mg/m2 | Freq: Once | INTRAVENOUS | Status: AC
  Administered 2013-08-09: 640 mg via INTRAVENOUS
  Filled 2013-08-09: qty 32

## 2013-08-09 MED ORDER — ONDANSETRON 8 MG/NS 50 ML IVPB
INTRAVENOUS | Status: AC
  Filled 2013-08-09: qty 8

## 2013-08-09 NOTE — Patient Instructions (Addendum)
INR at goal No changes Continue Coumadin 5mg  daily except 7.5 mg on Mondays.   Recheck PT/INR with next scheduled lab/appointments on 08/23/13: lab @ 9:15am and coumadin clinic at 9:30am and MD visit with Adrena at 9:45am .

## 2013-08-09 NOTE — Progress Notes (Signed)
INR at goal No complaints regarding bleeding/bruising No missed doses No changes to diet or medications Plan to continue Coumadin 5mg  daily except 7.5 mg on Mondays.   Recheck PT/INR with next scheduled lab/appointments on 08/23/13: lab @ 9:15am and coumadin clinic at 9:30am and office visit with [name] at 9:45am .

## 2013-08-09 NOTE — Patient Instructions (Addendum)
[facility] Discharge Instructions for Patients Receiving Chemotherapy  Today you received the following chemotherapy agents Kyprolis, cytoxan To help prevent nausea and vomiting after your treatment, we encourage you to take your nausea medication    If you develop nausea and vomiting that is not controlled by your nausea medication, call the clinic.   BELOW ARE SYMPTOMS THAT SHOULD BE REPORTED IMMEDIATELY:  *FEVER GREATER THAN 100.5 F  *CHILLS WITH OR WITHOUT FEVER  NAUSEA AND VOMITING THAT IS NOT CONTROLLED WITH YOUR NAUSEA MEDICATION  *UNUSUAL SHORTNESS OF BREATH  *UNUSUAL BRUISING OR BLEEDING  TENDERNESS IN MOUTH AND THROAT WITH OR WITHOUT PRESENCE OF ULCERS  *URINARY PROBLEMS  *BOWEL PROBLEMS  UNUSUAL RASH Items with * indicate a potential emergency and should be followed up as soon as possible.  Feel free to call the clinic you have any questions or concerns. The clinic phone number is [phone].

## 2013-08-09 NOTE — Telephone Encounter (Signed)
gve the pt his sept,oct 2014 appt calendar along with the avs. Sent michelle a staff message to add the chemo appt on for 08/23/2013.

## 2013-08-09 NOTE — Progress Notes (Addendum)
[facility] Telephone:(336) [phone]   Fax:(336) [phone]  SHARED VISIT PROGRESS NOTE  [name], MD [address] [place] [address]  Principle Diagnosis:  #1 recurrent multiple myeloma IgG kappa subtype diagnosed in June of 2008  #2 history of vasculitis and thrombosis of skin lesions   Prior Therapy:  #1 status post palliative radiotherapy to the left hip under the care of Dr. Roselind Messier  #2 status post 5 cycles of systemic chemotherapy with Revlimid and low dose Decadron. Last dose given June 2009 with good response.  #3 status post autologous peripheral blood stem cell transplant at [facility] on 02/02/2008.  #4 the patient had evidence for disease recurrence in December 2010.  #5 Revlimid 25 mg by mouth daily for 21 days every 4 weeks in addition to Decadron 40 mg orally on a weekly basis. The patient is status post 28 cycles, discontinued today secondary to disease progression.  #6 Systemic chemotherapy with Velcade 1.3 mg/M2 on days 1, 4, 8 and 11 in addition to Doxil 30 mg/M2 on day 4 and Decadron 40 mg by mouth on weekly basis every 3 weeks. Status post 3 cycles, last dose was given 05/04/2012 discontinued secondary to intolerance.  #7 Salvage therapy treatment with the Pomalyst 4 mg by mouth daily for 21 days every 28 days as well as Cytoxan 50 mg by mouth every other day and dexamethasone 40 mg by mouth once weekly. Therapy beginning 09/25/2012, status post 2 cycles discontinued secondary to disease progression and intolerance.  #8 Kyprolis (Carfilzomib) 27 mg/M2 on days 1, 2, 8, 9, 15 and 16 every 4 weeks. First dose on 02/08/2013. This would be concurrent with the dexamethasone 40 mg by mouth on a weekly basis. Status post 3 cycles with disease progression.   Current therapy:  1) Systemic chemotherapy with Carfilzomib 27 mg/M2 days 1,2 , 8, 9, 15 and 16, Cytoxan 300 mg/M2 and dexamethasone 40 mg by mouth weekly every 4 weeks.  Status post 3 cycles 2) Zometa 4 mg IV given every 3 months.    INTERVAL HISTORY: [name] 52 y.o. male returns to the clinic today for followup visit accompanied his wife. Today he feels well and voices no specific complaints.  He denied having any significant weakness but continues to have mild fatigue. He has no chest pain, shortness of breath, cough or hemoptysis. The patient denied having any significant fever or chills, no weight loss or night sweats. He presents to proceed with cycle 4 today of his treatment with Kyprolis, Cytoxan and dexamethasone.  MEDICAL HISTORY: Past Medical History  Diagnosis Date  . Multiple myeloma 09/24/2011  . Hypertension     ALLERGIES:  is allergic to red dye and other.  MEDICATIONS:  Current Outpatient Prescriptions  Medication Sig Dispense Refill  . amLODipine (NORVASC) 10 MG tablet Take 10 mg by mouth daily.      . benzonatate (TESSALON) 200 MG capsule Take 200 mg by mouth 3 (three) times daily as needed.       [name] dexamethasone (DECADRON) 4 MG tablet Take 10 tablets (40 mg total) by mouth once a week. Takes 40 mg Q Monday  40 tablet  2  . HYDROcodone-acetaminophen (NORCO) 10-325 MG per tablet Take 1-2 tablets by mouth every 6 (six) hours as needed for pain. For pain.  40 tablet  0  . nebivolol (BYSTOLIC) 5 MG tablet Take 5 mg by mouth daily.      [name]  omeprazole (PRILOSEC) 40 MG capsule Take 40 mg by mouth daily as needed (for heartburn).       [name] oxyCODONE-acetaminophen (PERCOCET/ROXICET) 5-325 MG per tablet Take 1 tablet by mouth every 6 (six) hours as needed for pain.  40 tablet  0  . potassium chloride SA (K-DUR,KLOR-CON) 20 MEQ tablet Take 1 tablet (20 mEq total) by mouth as directed. daily x 7 days  7 tablet  0  . potassium chloride SA (K-DUR,KLOR-CON) 20 MEQ tablet Take 1 tablet (20 mEq total) by mouth daily.  30 tablet  0  . pregabalin (LYRICA) 50 MG capsule Take 1 capsule (50 mg total) by mouth 3 (three) times daily.  90 capsule  2   . warfarin (COUMADIN) 5 MG tablet TAKE 7.5MG (1 AND 1/2) DAILY EXCEPT 5MG (1 TAB) ON TUES, THURS, AND SAT, OR AS DIRECTED  40 tablet  2   No current facility-administered medications for this visit.   Facility-Administered Medications Ordered in Other Visits  Medication Dose Route Frequency Provider Last Rate Last Dose  . sodium chloride 0.9 % injection 10 mL  10 mL Intracatheter PRN Si Gaul, MD   10 mL at 07/27/13 1617  . sodium chloride 0.9 % injection 10 mL  10 mL Intracatheter PRN Conni Slipper, PA-C   10 mL at 08/09/13 1545    SURGICAL HISTORY:  Past Surgical History  Procedure Laterality Date  . Video bronchoscopy  12/11/2012    Procedure: VIDEO BRONCHOSCOPY;  Surgeon: Kerin Perna, MD;  Location: [facility] OR;  Service: Thoracic;  Laterality: N/A;  . Video assisted thoracoscopy  12/11/2012    Procedure: VIDEO ASSISTED THORACOSCOPY;  Surgeon: Kerin Perna, MD;  Location: [facility] OR;  Service: Thoracic;  Laterality: Right;  . Decortication  12/11/2012    Procedure: DECORTICATION;  Surgeon: Kerin Perna, MD;  Location: [facility] OR;  Service: Thoracic;  Laterality: N/A;    REVIEW OF SYSTEMS:  A comprehensive review of systems was negative except for: Constitutional: positive for fatigue   PHYSICAL EXAMINATION: General appearance: alert, cooperative, fatigued and no distress Head: Normocephalic, without obvious abnormality, atraumatic Neck: no adenopathy Lymph nodes: Cervical, supraclavicular, and axillary nodes normal. Resp: clear to auscultation bilaterally Cardio: regular rate and rhythm, S1, S2 normal, no murmur, click, rub or gallop GI: soft, non-tender; bowel sounds normal; no masses,  no organomegaly Extremities: extremities normal, atraumatic, no cyanosis or edema Neurologic: Alert and oriented X 3, normal strength and tone. Normal symmetric reflexes. Normal coordination and gait Skin: Generally dry, there were no areas of skin breakdown, Subcutaneous nodule felt in the left  lower mid abdomen, approximately onecentimeter in size, nontender  ECOG PERFORMANCE STATUS: 1 - Symptomatic but completely ambulatory  Blood pressure 146/72, pulse 84, temperature 98.9 F (37.2 C), temperature source Oral, resp. rate 18, height 5\' 3"  (1.6 m), weight 229 lb 1.6 oz (103.919 kg), SpO2 100.00%.  LABORATORY DATA: Lab Results  Component Value Date   WBC 4.9 08/09/2013   HGB 8.1* 08/09/2013   HCT 25.2* 08/09/2013   MCV 96.9 08/09/2013   PLT 136* 08/09/2013      Chemistry      Component Value Date/Time   NA 137 08/09/2013 1105   NA 136 03/16/2013 1930   K 3.6 08/09/2013 1105   K 3.8 03/16/2013 1930   CL 112* 05/10/2013 0849   CL 110 03/16/2013 1930   CO2 20* 08/09/2013 1105   CO2 16* 03/16/2013 1930   BUN 11.3 08/09/2013 1105   BUN 22  03/16/2013 1930   CREATININE 0.8 08/09/2013 1105   CREATININE 1.24 03/16/2013 1930      Component Value Date/Time   CALCIUM 8.9 08/09/2013 1105   CALCIUM 8.2* 03/16/2013 1930   ALKPHOS 33* 08/09/2013 1105   ALKPHOS 31* 03/16/2013 1930   AST 12 08/09/2013 1105   AST 11 03/16/2013 1930   ALT 10 08/09/2013 1105   ALT 15 03/16/2013 1930   BILITOT 0.64 08/09/2013 1105   BILITOT 0.2* 03/16/2013 1930       RADIOGRAPHIC STUDIES: Dg Chest 2 View  07/05/2013   *RADIOLOGY REPORT*  Clinical Data: Cough.  History of multiple myeloma.  CHEST - 2 VIEW  Comparison: PA and lateral chest 02/05/2013 and 04/19/2013.  Findings: Port-A-Cath remains in place.  Lungs are clear.  No pneumothorax or pleural effusion.  Likely osteotomy right sixth rib is again seen.  IMPRESSION: No acute finding.   Original Report Authenticated By: Holley Dexter, M.D.    ASSESSMENT AND PLAN: This is a very pleasant 52 years old African American male with recurrent multiple myeloma currently on systemic chemotherapy with Carfilzomib, Cytoxan and dexamethasone status post 3 cycles with improvement in his disease.  Patient was discussed with an also seen by Dr. Arbutus Ped. He will proceed with day  1 of cycle 4 of his systemic chemotherapy with Carfilzomib, Cytoxan and dexamethasone today as scheduled. He'll return in 2 weeks for another symptom management visit. We will plan to order his restaging myeloma panel when he returns in 2 weeks.  Laural Benes, Kenidi Elenbaas E, PA-C   He was advised to call immediately if he has any concerning symptoms in the interval. The patient voices understanding of current disease status and treatment options and is in agreement with the current care plan.  All questions were answered. The patient knows to call the clinic with any problems, questions or concerns. We can certainly see the patient much sooner if necessary.   ADDENDUM: Hematology/Oncology Attending: I have the face to face encounter with the patient today. I recommended his care plan. He is a very pleasant 53 years old [place] American male with recurrent multiple myeloma currently undergoing systemic chemotherapy with Carfilzomib, Cytoxan and 10 dexamethasone status post 3 cycles. The patient is tolerating his treatment fairly well with no significant complaints except for mild fatigue. I recommended for him to proceed with cycle #4 today as scheduled. He would come back for followup visit in 2 weeks for reevaluation and management any adverse effect of his treatment.  For the chemotherapy-induced anemia, I will arrange for the patient to receive PRBCs transfusion if his hemoglobin is less than 8.0 g/kg. He was advised to call immediately if he has any concerning symptoms in the interval. Lajuana Matte., MD 08/09/2013

## 2013-08-09 NOTE — Patient Instructions (Addendum)
Continue with labs and chemotherapy as scheduled Followup in 2 weeks 

## 2013-08-09 NOTE — Telephone Encounter (Signed)
Per staff message and POF I have scheduled appts.  [name]  

## 2013-08-10 ENCOUNTER — Ambulatory Visit (HOSPITAL_BASED_OUTPATIENT_CLINIC_OR_DEPARTMENT_OTHER): Payer: 59

## 2013-08-10 VITALS — BP 150/83 | HR 73 | Temp 98.0°F | Resp 20

## 2013-08-10 DIAGNOSIS — Z5112 Encounter for antineoplastic immunotherapy: Secondary | ICD-10-CM

## 2013-08-10 DIAGNOSIS — C9000 Multiple myeloma not having achieved remission: Secondary | ICD-10-CM

## 2013-08-10 MED ORDER — SODIUM CHLORIDE 0.9 % IV SOLN
Freq: Once | INTRAVENOUS | Status: AC
  Administered 2013-08-10: 09:00:00 via INTRAVENOUS

## 2013-08-10 MED ORDER — HEPARIN SOD (PORK) LOCK FLUSH 100 UNIT/ML IV SOLN
500.0000 [IU] | Freq: Once | INTRAVENOUS | Status: AC | PRN
  Administered 2013-08-10: 500 [IU]
  Filled 2013-08-10: qty 5

## 2013-08-10 MED ORDER — SODIUM CHLORIDE 0.9 % IJ SOLN
10.0000 mL | INTRAMUSCULAR | Status: DC | PRN
  Administered 2013-08-10: 10 mL
  Filled 2013-08-10: qty 10

## 2013-08-10 MED ORDER — DEXTROSE 5 % IV SOLN
27.0000 mg/m2 | Freq: Once | INTRAVENOUS | Status: AC
  Administered 2013-08-10: 56 mg via INTRAVENOUS
  Filled 2013-08-10: qty 28

## 2013-08-10 MED ORDER — DEXAMETHASONE SODIUM PHOSPHATE 10 MG/ML IJ SOLN
10.0000 mg | Freq: Once | INTRAMUSCULAR | Status: AC
  Administered 2013-08-10: 10 mg via INTRAVENOUS

## 2013-08-10 MED ORDER — SODIUM CHLORIDE 0.9 % IV SOLN
Freq: Once | INTRAVENOUS | Status: AC
  Administered 2013-08-10: 08:00:00 via INTRAVENOUS

## 2013-08-10 MED ORDER — DEXAMETHASONE SODIUM PHOSPHATE 10 MG/ML IJ SOLN
INTRAMUSCULAR | Status: AC
  Filled 2013-08-10: qty 1

## 2013-08-10 MED ORDER — ONDANSETRON 8 MG/50ML IVPB (CHCC)
8.0000 mg | Freq: Once | INTRAVENOUS | Status: AC
  Administered 2013-08-10: 8 mg via INTRAVENOUS

## 2013-08-10 MED ORDER — ONDANSETRON 8 MG/NS 50 ML IVPB
INTRAVENOUS | Status: AC
  Filled 2013-08-10: qty 8

## 2013-08-10 NOTE — Patient Instructions (Addendum)
[facility] Discharge Instructions for Patients Receiving Chemotherapy  Today you received the following chemotherapy agents Kyprolis To help prevent nausea and vomiting after your treatment, we encourage you to take your nausea medication as prescribed.  If you develop nausea and vomiting that is not controlled by your nausea medication, call the clinic.   BELOW ARE SYMPTOMS THAT SHOULD BE REPORTED IMMEDIATELY:  *FEVER GREATER THAN 100.5 F  *CHILLS WITH OR WITHOUT FEVER  NAUSEA AND VOMITING THAT IS NOT CONTROLLED WITH YOUR NAUSEA MEDICATION  *UNUSUAL SHORTNESS OF BREATH  *UNUSUAL BRUISING OR BLEEDING  TENDERNESS IN MOUTH AND THROAT WITH OR WITHOUT PRESENCE OF ULCERS  *URINARY PROBLEMS  *BOWEL PROBLEMS  UNUSUAL RASH Items with * indicate a potential emergency and should be followed up as soon as possible.  Feel free to call the clinic you have any questions or concerns. The clinic phone number is [phone].    

## 2013-08-16 ENCOUNTER — Ambulatory Visit (HOSPITAL_COMMUNITY)
Admission: RE | Admit: 2013-08-16 | Discharge: 2013-08-16 | Disposition: A | Discharge status: Home / Self Care | Payer: 59 | Source: Ambulatory Visit | Attending: Internal Medicine | Admitting: Internal Medicine

## 2013-08-16 ENCOUNTER — Other Ambulatory Visit (HOSPITAL_BASED_OUTPATIENT_CLINIC_OR_DEPARTMENT_OTHER): Payer: 59

## 2013-08-16 ENCOUNTER — Ambulatory Visit (HOSPITAL_BASED_OUTPATIENT_CLINIC_OR_DEPARTMENT_OTHER): Payer: 59

## 2013-08-16 VITALS — BP 149/81 | HR 81 | Temp 98.5°F | Resp 20

## 2013-08-16 DIAGNOSIS — D649 Anemia, unspecified: Secondary | ICD-10-CM

## 2013-08-16 DIAGNOSIS — C9002 Multiple myeloma in relapse: Secondary | ICD-10-CM

## 2013-08-16 DIAGNOSIS — C9000 Multiple myeloma not having achieved remission: Secondary | ICD-10-CM

## 2013-08-16 DIAGNOSIS — Z5111 Encounter for antineoplastic chemotherapy: Secondary | ICD-10-CM

## 2013-08-16 DIAGNOSIS — Z5112 Encounter for antineoplastic immunotherapy: Secondary | ICD-10-CM

## 2013-08-16 LAB — COMPREHENSIVE METABOLIC PANEL (CC13)
AST: 12 U/L (ref 5–34)
Alkaline Phosphatase: 32 U/L — ABNORMAL LOW (ref 40–150)
BUN: 8.5 mg/dL (ref 7.0–26.0)
CO2: 21 mEq/L — ABNORMAL LOW (ref 22–29)
Creatinine: 0.8 mg/dL (ref 0.7–1.3)
Potassium: 3.7 mEq/L (ref 3.5–5.1)
Total Protein: 9.8 g/dL — ABNORMAL HIGH (ref 6.4–8.3)

## 2013-08-16 LAB — CBC WITH DIFFERENTIAL/PLATELET
BASO%: 0.7 % (ref 0.0–2.0)
EOS%: 0.5 % (ref 0.0–7.0)
Eosinophils Absolute: 0 10*3/uL (ref 0.0–0.5)
HCT: 23.6 % — ABNORMAL LOW (ref 38.4–49.9)
LYMPH%: 16.1 % (ref 14.0–49.0)
MCH: 31.5 pg (ref 27.2–33.4)
MCHC: 32.2 g/dL (ref 32.0–36.0)
NEUT%: 70.5 % (ref 39.0–75.0)
RBC: 2.41 10*6/uL — ABNORMAL LOW (ref 4.20–5.82)
RDW: 19.9 % — ABNORMAL HIGH (ref 11.0–14.6)
lymph#: 0.7 10*3/uL — ABNORMAL LOW (ref 0.9–3.3)
nRBC: 4 % — ABNORMAL HIGH (ref 0–0)

## 2013-08-16 MED ORDER — DEXAMETHASONE 4 MG PO TABS
ORAL_TABLET | ORAL | Status: AC
  Filled 2013-08-16: qty 10

## 2013-08-16 MED ORDER — ONDANSETRON 8 MG/NS 50 ML IVPB
INTRAVENOUS | Status: AC
  Filled 2013-08-16: qty 8

## 2013-08-16 MED ORDER — ONDANSETRON 8 MG/50ML IVPB (CHCC)
8.0000 mg | Freq: Once | INTRAVENOUS | Status: AC
  Administered 2013-08-16: 8 mg via INTRAVENOUS

## 2013-08-16 MED ORDER — DEXAMETHASONE SODIUM PHOSPHATE 10 MG/ML IJ SOLN
10.0000 mg | Freq: Once | INTRAMUSCULAR | Status: AC
  Administered 2013-08-16: 10 mg via INTRAVENOUS

## 2013-08-16 MED ORDER — SODIUM CHLORIDE 0.9 % IV SOLN
Freq: Once | INTRAVENOUS | Status: AC
  Administered 2013-08-16: 09:00:00 via INTRAVENOUS

## 2013-08-16 MED ORDER — DEXAMETHASONE 4 MG PO TABS
40.0000 mg | ORAL_TABLET | Freq: Once | ORAL | Status: AC
  Administered 2013-08-16: 40 mg via ORAL

## 2013-08-16 MED ORDER — DEXTROSE 5 % IV SOLN
27.0000 mg/m2 | Freq: Once | INTRAVENOUS | Status: AC
  Administered 2013-08-16: 56 mg via INTRAVENOUS
  Filled 2013-08-16: qty 28

## 2013-08-16 MED ORDER — SODIUM CHLORIDE 0.9 % IJ SOLN
10.0000 mL | INTRAMUSCULAR | Status: DC | PRN
  Administered 2013-08-16: 10 mL
  Filled 2013-08-16: qty 10

## 2013-08-16 MED ORDER — HEPARIN SOD (PORK) LOCK FLUSH 100 UNIT/ML IV SOLN
500.0000 [IU] | Freq: Once | INTRAVENOUS | Status: AC | PRN
  Administered 2013-08-16: 500 [IU]
  Filled 2013-08-16: qty 5

## 2013-08-16 MED ORDER — SODIUM CHLORIDE 0.9 % IV SOLN
Freq: Once | INTRAVENOUS | Status: AC
  Administered 2013-08-16: 10:00:00 via INTRAVENOUS

## 2013-08-16 MED ORDER — DEXAMETHASONE SODIUM PHOSPHATE 10 MG/ML IJ SOLN
INTRAMUSCULAR | Status: AC
  Filled 2013-08-16: qty 1

## 2013-08-16 MED ORDER — SODIUM CHLORIDE 0.9 % IV SOLN
300.0000 mg/m2 | Freq: Once | INTRAVENOUS | Status: AC
  Administered 2013-08-16: 640 mg via INTRAVENOUS
  Filled 2013-08-16: qty 32

## 2013-08-16 NOTE — Progress Notes (Signed)
Hgb 7.6 and plts 81 today. Labs printed and given to Dr. Asa Lente desk RN for MD review. Will await further instructions. Patient AAO, VSS, denies SOB. Clayborn Heron, RN  Per Dr. Arbutus Ped, proceed with chemotherapy as scheduled today. Will prepare patient to receive 2 units RBC's.   Patient denies taking dexamethasone 40 mg at home this morning. Will order and administer while in infusion room today.

## 2013-08-16 NOTE — Patient Instructions (Addendum)
[facility] Discharge Instructions for Patients Receiving Chemotherapy  Today you received the following chemotherapy agents: Cytoxan, Kyprolis, Dexamethasone   To help prevent nausea and vomiting after your treatment, we encourage you to take your nausea medication as prescribed.   If you develop nausea and vomiting that is not controlled by your nausea medication, call the clinic.   BELOW ARE SYMPTOMS THAT SHOULD BE REPORTED IMMEDIATELY:  *FEVER GREATER THAN 100.5 F  *CHILLS WITH OR WITHOUT FEVER  NAUSEA AND VOMITING THAT IS NOT CONTROLLED WITH YOUR NAUSEA MEDICATION  *UNUSUAL SHORTNESS OF BREATH  *UNUSUAL BRUISING OR BLEEDING  TENDERNESS IN MOUTH AND THROAT WITH OR WITHOUT PRESENCE OF ULCERS  *URINARY PROBLEMS  *BOWEL PROBLEMS  UNUSUAL RASH Items with * indicate a potential emergency and should be followed up as soon as possible.  Feel free to call the clinic you have any questions or concerns. The clinic phone number is [phone].

## 2013-08-17 ENCOUNTER — Ambulatory Visit (HOSPITAL_BASED_OUTPATIENT_CLINIC_OR_DEPARTMENT_OTHER): Payer: 59

## 2013-08-17 VITALS — BP 139/77 | HR 68 | Temp 97.9°F | Resp 20

## 2013-08-17 DIAGNOSIS — Z5112 Encounter for antineoplastic immunotherapy: Secondary | ICD-10-CM

## 2013-08-17 DIAGNOSIS — C9002 Multiple myeloma in relapse: Secondary | ICD-10-CM

## 2013-08-17 LAB — PREPARE RBC (CROSSMATCH)

## 2013-08-17 MED ORDER — ACETAMINOPHEN 325 MG PO TABS
ORAL_TABLET | ORAL | Status: AC
  Filled 2013-08-17: qty 2

## 2013-08-17 MED ORDER — DEXAMETHASONE SODIUM PHOSPHATE 10 MG/ML IJ SOLN
10.0000 mg | Freq: Once | INTRAMUSCULAR | Status: AC
  Administered 2013-08-17: 10 mg via INTRAVENOUS

## 2013-08-17 MED ORDER — DIPHENHYDRAMINE HCL 25 MG PO CAPS
25.0000 mg | ORAL_CAPSULE | Freq: Once | ORAL | Status: AC
  Administered 2013-08-17: 25 mg via ORAL

## 2013-08-17 MED ORDER — DIPHENHYDRAMINE HCL 25 MG PO CAPS
ORAL_CAPSULE | ORAL | Status: AC
  Filled 2013-08-17: qty 1

## 2013-08-17 MED ORDER — SODIUM CHLORIDE 0.9 % IV SOLN
Freq: Once | INTRAVENOUS | Status: AC
  Administered 2013-08-17: 08:00:00 via INTRAVENOUS

## 2013-08-17 MED ORDER — SODIUM CHLORIDE 0.9 % IJ SOLN
10.0000 mL | INTRAMUSCULAR | Status: AC | PRN
  Administered 2013-08-17: 10 mL
  Filled 2013-08-17: qty 10

## 2013-08-17 MED ORDER — DEXTROSE 5 % IV SOLN
27.0000 mg/m2 | Freq: Once | INTRAVENOUS | Status: AC
  Administered 2013-08-17: 56 mg via INTRAVENOUS
  Filled 2013-08-17: qty 28

## 2013-08-17 MED ORDER — SODIUM CHLORIDE 0.9 % IJ SOLN
10.0000 mL | INTRAMUSCULAR | Status: DC | PRN
  Filled 2013-08-17: qty 10

## 2013-08-17 MED ORDER — HEPARIN SOD (PORK) LOCK FLUSH 100 UNIT/ML IV SOLN
500.0000 [IU] | Freq: Once | INTRAVENOUS | Status: AC | PRN
  Administered 2013-08-17: 500 [IU]
  Filled 2013-08-17: qty 5

## 2013-08-17 MED ORDER — ACETAMINOPHEN 325 MG PO TABS
650.0000 mg | ORAL_TABLET | Freq: Once | ORAL | Status: AC
  Administered 2013-08-17: 650 mg via ORAL

## 2013-08-17 MED ORDER — SODIUM CHLORIDE 0.9 % IV SOLN
250.0000 mL | Freq: Once | INTRAVENOUS | Status: AC
  Administered 2013-08-17: 250 mL via INTRAVENOUS

## 2013-08-17 MED ORDER — ONDANSETRON 8 MG/50ML IVPB (CHCC)
8.0000 mg | Freq: Once | INTRAVENOUS | Status: AC
  Administered 2013-08-17: 8 mg via INTRAVENOUS

## 2013-08-17 NOTE — Patient Instructions (Addendum)
[facility] Discharge Instructions for Patients Receiving Chemotherapy  Today you received the following chemotherapy agents: Kyprolis  You also received 2 units of blood. Tylenol (650 mg) and Benadryl (25 mg) were given prior to blood transfusion.  To help prevent nausea and vomiting after your treatment, we encourage you to take your nausea medication as prescribed.   If you develop nausea and vomiting that is not controlled by your nausea medication, call the clinic.   BELOW ARE SYMPTOMS THAT SHOULD BE REPORTED IMMEDIATELY:  *FEVER GREATER THAN 100.5 F  *CHILLS WITH OR WITHOUT FEVER  NAUSEA AND VOMITING THAT IS NOT CONTROLLED WITH YOUR NAUSEA MEDICATION  *UNUSUAL SHORTNESS OF BREATH  *UNUSUAL BRUISING OR BLEEDING  TENDERNESS IN MOUTH AND THROAT WITH OR WITHOUT PRESENCE OF ULCERS  *URINARY PROBLEMS  *BOWEL PROBLEMS  UNUSUAL RASH Items with * indicate a potential emergency and should be followed up as soon as possible.  Feel free to call the clinic you have any questions or concerns. The clinic phone number is [phone].

## 2013-08-18 LAB — TYPE AND SCREEN
Donor AG Type: NEGATIVE
Donor AG Type: NEGATIVE
Unit division: 0

## 2013-08-23 ENCOUNTER — Ambulatory Visit (HOSPITAL_BASED_OUTPATIENT_CLINIC_OR_DEPARTMENT_OTHER): Payer: 59 | Admitting: Pharmacist

## 2013-08-23 ENCOUNTER — Other Ambulatory Visit (HOSPITAL_BASED_OUTPATIENT_CLINIC_OR_DEPARTMENT_OTHER): Payer: 59 | Admitting: Lab

## 2013-08-23 ENCOUNTER — Telehealth: Payer: Self-pay | Admitting: Internal Medicine

## 2013-08-23 ENCOUNTER — Encounter: Payer: Self-pay | Admitting: Physician Assistant

## 2013-08-23 ENCOUNTER — Ambulatory Visit (HOSPITAL_BASED_OUTPATIENT_CLINIC_OR_DEPARTMENT_OTHER): Payer: 59 | Admitting: Physician Assistant

## 2013-08-23 ENCOUNTER — Ambulatory Visit (HOSPITAL_BASED_OUTPATIENT_CLINIC_OR_DEPARTMENT_OTHER): Payer: 59

## 2013-08-23 VITALS — BP 149/79 | HR 78 | Temp 98.9°F | Resp 18 | Ht 63.0 in | Wt 227.4 lb

## 2013-08-23 DIAGNOSIS — I776 Arteritis, unspecified: Secondary | ICD-10-CM

## 2013-08-23 DIAGNOSIS — C9002 Multiple myeloma in relapse: Secondary | ICD-10-CM

## 2013-08-23 DIAGNOSIS — C9000 Multiple myeloma not having achieved remission: Secondary | ICD-10-CM

## 2013-08-23 DIAGNOSIS — Z23 Encounter for immunization: Secondary | ICD-10-CM

## 2013-08-23 DIAGNOSIS — Z5112 Encounter for antineoplastic immunotherapy: Secondary | ICD-10-CM

## 2013-08-23 LAB — COMPREHENSIVE METABOLIC PANEL (CC13)
ALT: 8 U/L (ref 0–55)
Alkaline Phosphatase: 27 U/L — ABNORMAL LOW (ref 40–150)
CO2: 21 mEq/L — ABNORMAL LOW (ref 22–29)
Calcium: 9.4 mg/dL (ref 8.4–10.4)
Chloride: 109 mEq/L (ref 98–109)
Creatinine: 0.8 mg/dL (ref 0.7–1.3)
Glucose: 79 mg/dl (ref 70–140)
Potassium: 3.7 mEq/L (ref 3.5–5.1)
Sodium: 136 mEq/L (ref 136–145)
Total Bilirubin: 0.82 mg/dL (ref 0.20–1.20)

## 2013-08-23 LAB — CBC WITH DIFFERENTIAL/PLATELET
BASO%: 0.5 % (ref 0.0–2.0)
HCT: 26.7 % — ABNORMAL LOW (ref 38.4–49.9)
HGB: 8.9 g/dL — ABNORMAL LOW (ref 13.0–17.1)
LYMPH%: 16.5 % (ref 14.0–49.0)
MCHC: 33.3 g/dL (ref 32.0–36.0)
MCV: 93.4 fL (ref 79.3–98.0)
MONO#: 0.5 10*3/uL (ref 0.1–0.9)
MONO%: 11.6 % (ref 0.0–14.0)
NEUT%: 71.1 % (ref 39.0–75.0)
Platelets: 80 10*3/uL — ABNORMAL LOW (ref 140–400)
RBC: 2.86 10*6/uL — ABNORMAL LOW (ref 4.20–5.82)
RDW: 19 % — ABNORMAL HIGH (ref 11.0–14.6)
WBC: 3.9 10*3/uL — ABNORMAL LOW (ref 4.0–10.3)

## 2013-08-23 LAB — POCT INR: INR: 1.8

## 2013-08-23 LAB — PROTIME-INR: Protime: 21.6 Seconds — ABNORMAL HIGH (ref 10.6–13.4)

## 2013-08-23 LAB — TECHNOLOGIST REVIEW

## 2013-08-23 MED ORDER — DEXTROSE 5 % IV SOLN
27.0000 mg/m2 | Freq: Once | INTRAVENOUS | Status: AC
  Administered 2013-08-23: 56 mg via INTRAVENOUS
  Filled 2013-08-23: qty 28

## 2013-08-23 MED ORDER — PREGABALIN 50 MG PO CAPS: 50.0000 mg | ORAL_CAPSULE | Freq: Three times a day (TID) | ORAL | Status: DC

## 2013-08-23 MED ORDER — ZOLEDRONIC ACID 4 MG/5ML IV CONC: 4.0000 mg | Freq: Once | INTRAVENOUS | Status: DC

## 2013-08-23 MED ORDER — ONDANSETRON 8 MG/NS 50 ML IVPB
INTRAVENOUS | Status: AC
  Filled 2013-08-23: qty 8

## 2013-08-23 MED ORDER — DEXAMETHASONE SODIUM PHOSPHATE 10 MG/ML IJ SOLN
INTRAMUSCULAR | Status: AC
  Filled 2013-08-23: qty 1

## 2013-08-23 MED ORDER — SODIUM CHLORIDE 0.9 % IV SOLN
300.0000 mg/m2 | Freq: Once | INTRAVENOUS | Status: AC
  Administered 2013-08-23: 640 mg via INTRAVENOUS
  Filled 2013-08-23: qty 32

## 2013-08-23 MED ORDER — SODIUM CHLORIDE 0.9 % IV SOLN
Freq: Once | INTRAVENOUS | Status: AC
  Administered 2013-08-23: 12:00:00 via INTRAVENOUS

## 2013-08-23 MED ORDER — ONDANSETRON 8 MG/50ML IVPB (CHCC)
8.0000 mg | Freq: Once | INTRAVENOUS | Status: AC
  Administered 2013-08-23: 8 mg via INTRAVENOUS

## 2013-08-23 MED ORDER — SODIUM CHLORIDE 0.9 % IJ SOLN
10.0000 mL | INTRAMUSCULAR | Status: DC | PRN
  Administered 2013-08-23: 10 mL
  Filled 2013-08-23: qty 10

## 2013-08-23 MED ORDER — INFLUENZA VAC SPLIT QUAD 0.5 ML IM SUSP
0.5000 mL | Freq: Once | INTRAMUSCULAR | Status: AC
  Administered 2013-08-23: 0.5 mL via INTRAMUSCULAR
  Filled 2013-08-23: qty 0.5

## 2013-08-23 MED ORDER — ZOLEDRONIC ACID 4 MG/100ML IV SOLN
4.0000 mg | Freq: Once | INTRAVENOUS | Status: AC
  Administered 2013-08-23: 4 mg via INTRAVENOUS
  Filled 2013-08-23: qty 100

## 2013-08-23 MED ORDER — HEPARIN SOD (PORK) LOCK FLUSH 100 UNIT/ML IV SOLN
500.0000 [IU] | Freq: Once | INTRAVENOUS | Status: AC | PRN
  Administered 2013-08-23: 500 [IU]
  Filled 2013-08-23: qty 5

## 2013-08-23 MED ORDER — DEXAMETHASONE SODIUM PHOSPHATE 10 MG/ML IJ SOLN
10.0000 mg | Freq: Once | INTRAMUSCULAR | Status: AC
  Administered 2013-08-23: 10 mg via INTRAVENOUS

## 2013-08-23 NOTE — Patient Instructions (Addendum)
Return next week to have repeat protein studies drawn to re-evaluate your disease Follow up with Dr. Arbutus Ped in 2 weeks You will receive your Flu vaccine today as requested

## 2013-08-23 NOTE — Patient Instructions (Signed)
[facility] Discharge Instructions for Patients Receiving Chemotherapy  Today you received the following chemotherapy agents Cytoxan and Kyprolis. As well as Zometa and a Flu shot.  To help prevent nausea and vomiting after your treatment, we encourage you to take your nausea medication.   If you develop nausea and vomiting that is not controlled by your nausea medication, call the clinic.   BELOW ARE SYMPTOMS THAT SHOULD BE REPORTED IMMEDIATELY:  *FEVER GREATER THAN 100.5 F  *CHILLS WITH OR WITHOUT FEVER  NAUSEA AND VOMITING THAT IS NOT CONTROLLED WITH YOUR NAUSEA MEDICATION  *UNUSUAL SHORTNESS OF BREATH  *UNUSUAL BRUISING OR BLEEDING  TENDERNESS IN MOUTH AND THROAT WITH OR WITHOUT PRESENCE OF ULCERS  *URINARY PROBLEMS  *BOWEL PROBLEMS  UNUSUAL RASH Items with * indicate a potential emergency and should be followed up as soon as possible.  Feel free to call the clinic you have any questions or concerns. The clinic phone number is [phone].    ZOLEDRONIC ACID (ZOE le dron ik AS id) lowers the amount of calcium loss from bone. It is used to treat too much calcium in your blood from cancer. It is also used to prevent complications of cancer that has spread to the bone. This medicine may be used for other purposes; ask your health care provider or pharmacist if you have questions. What should I tell my health care provider before I take this medicine? They need to know if you have any of these conditions: -aspirin-sensitive asthma -dental disease -kidney disease -an unusual or allergic reaction to zoledronic acid, other medicines, foods, dyes, or preservatives -pregnant or trying to get pregnant -breast-feeding How should I use this medicine? This medicine is for infusion into a vein. It is given by a health care professional in a hospital or clinic setting. Talk to your pediatrician regarding the use of this medicine in children. Special care may be  needed. Overdosage: If you think you have taken too much of this medicine contact a poison control center or emergency room at once. NOTE: This medicine is only for you. Do not share this medicine with others. What if I miss a dose? It is important not to miss your dose. Call your doctor or health care professional if you are unable to keep an appointment. What may interact with this medicine? -certain antibiotics given by injection -NSAIDs, medicines for pain and inflammation, like ibuprofen or naproxen -some diuretics like bumetanide, furosemide -teriparatide -thalidomide This list may not describe all possible interactions. Give your health care provider a list of all the medicines, herbs, non-prescription drugs, or dietary supplements you use. Also tell them if you smoke, drink alcohol, or use illegal drugs. Some items may interact with your medicine. What should I watch for while using this medicine? Visit your doctor or health care professional for regular checkups. It may be some time before you see the benefit from this medicine. Do not stop taking your medicine unless your doctor tells you to. Your doctor may order blood tests or other tests to see how you are doing. Women should inform their doctor if they wish to become pregnant or think they might be pregnant. There is a potential for serious side effects to an unborn child. Talk to your health care professional or pharmacist for more information. You should make sure that you get enough calcium and vitamin D while you are taking this medicine. Discuss the foods you eat and the vitamins you take with your health care  professional. Some people who take this medicine have severe bone, joint, and/or muscle pain. This medicine may also increase your risk for a broken thigh bone. Tell your doctor right away if you have pain in your upper leg or groin. Tell your doctor if you have any pain that does not go away or that gets worse. What side  effects may I notice from receiving this medicine? Side effects that you should report to your doctor or health care professional as soon as possible: -allergic reactions like skin rash, itching or hives, swelling of the face, lips, or tongue -anxiety, confusion, or depression -breathing problems -changes in vision -feeling faint or lightheaded, falls -jaw burning, cramping, pain -muscle cramps, stiffness, or weakness -trouble passing urine or change in the amount of urine Side effects that usually do not require medical attention (report to your doctor or health care professional if they continue or are bothersome): -bone, joint, or muscle pain -fever -hair loss -irritation at site where injected -loss of appetite -nausea, vomiting -stomach upset -tired This list may not describe all possible side effects. Call your doctor for medical advice about side effects. You may report side effects to FDA at 1-800-FDA-1088. Where should I keep my medicine? This drug is given in a hospital or clinic and will not be stored at home. NOTE: This sheet is a summary. It may not cover all possible information. If you have questions about this medicine, talk to your doctor, pharmacist, or health care provider.  2012, Elsevier/Gold Standard. (05/03/2011 9:06:58 AM)

## 2013-08-23 NOTE — Telephone Encounter (Signed)
Per staff message and POF I have scheduled appts.  [name]  

## 2013-08-23 NOTE — Progress Notes (Addendum)
[facility] [phone]   [phone]  SHARED VISIT PROGRESS NOTE  [name], MD [address] [place] [address]  Principle Diagnosis:  #1 recurrent multiple myeloma IgG kappa subtype diagnosed in June of 2008  #2 history of vasculitis and thrombosis of skin lesions   Prior Therapy:  #1 status post palliative radiotherapy to the left hip under the care of Dr. Roselind Messier  #2 status post 5 cycles of systemic chemotherapy with Revlimid and low dose Decadron. Last dose given June 2009 with good response.  #3 status post autologous peripheral blood stem cell transplant at [facility] on 02/02/2008.  #4 the patient had evidence for disease recurrence in December 2010.  #5 Revlimid 25 mg by mouth daily for 21 days every 4 weeks in addition to Decadron 40 mg orally on a weekly basis. The patient is status post 28 cycles, discontinued today secondary to disease progression.  #6 Systemic chemotherapy with Velcade 1.3 mg/M2 on days 1, 4, 8 and 11 in addition to Doxil 30 mg/M2 on day 4 and Decadron 40 mg by mouth on weekly basis every 3 weeks. Status post 3 cycles, last dose was given 05/04/2012 discontinued secondary to intolerance.  #7 Salvage therapy treatment with the Pomalyst 4 mg by mouth daily for 21 days every 28 days as well as Cytoxan 50 mg by mouth every other day and dexamethasone 40 mg by mouth once weekly. Therapy beginning 09/25/2012, status post 2 cycles discontinued secondary to disease progression and intolerance.  #8 Kyprolis (Carfilzomib) 27 mg/M2 on days 1, 2, 8, 9, 15 and 16 every 4 weeks. First dose on 02/08/2013. This would be concurrent with the dexamethasone 40 mg by mouth on a weekly basis. Status post 3 cycles with disease progression.   Current therapy:  1) Systemic chemotherapy with Carfilzomib 27 mg/M2 days 1,2 , 8, 9, 15 and 16, Cytoxan 300 mg/M2 and dexamethasone 40 mg by mouth weekly every 4 weeks.  Status post 3 cycles 2) Zometa 4 mg IV given every 3 months.    INTERVAL HISTORY: [name] 52 y.o. male returns to the clinic today for followup visit accompanied his wife. Today he feels well and voices no specific complaints.  He denied having any significant weakness but continues to have mild fatigue. He has no chest pain, shortness of breath, cough or hemoptysis. The patient denied having any significant fever or chills, no weight loss or night sweats. He presents to proceed with cycle day 15 of 4 today of his treatment with Kyprolis, Cytoxan and dexamethasone. He requests a refill for his Lyrica 50 mg by mouth 3 times daily but states that he will need a 90 day supply for insurance purposes.  MEDICAL HISTORY: Past Medical History  Diagnosis Date  . Multiple myeloma 09/24/2011  . Hypertension     ALLERGIES:  is allergic to red dye and other.  MEDICATIONS:  Current Outpatient Prescriptions  Medication Sig Dispense Refill  . Nebivolol HCl (BYSTOLIC) 20 MG TABS Take 30 mg by mouth daily.      [name] amLODipine (NORVASC) 10 MG tablet Take 10 mg by mouth daily.      . benzonatate (TESSALON) 200 MG capsule Take 200 mg by mouth 3 (three) times daily as needed.       [name] dexamethasone (DECADRON) 4 MG tablet Take 10 tablets (40 mg total) by mouth once a week. Takes 40 mg Q Monday  40 tablet  2  . HYDROcodone-acetaminophen (NORCO) 10-325 MG per tablet Take 1-2 tablets by mouth every 6 (six) hours as needed for pain. For pain.  40 tablet  0  . omeprazole (PRILOSEC) 40 MG capsule Take 40 mg by mouth daily as needed (for heartburn).       [name] oxyCODONE-acetaminophen (PERCOCET/ROXICET) 5-325 MG per tablet Take 1 tablet by mouth every 6 (six) hours as needed for pain.  40 tablet  0  . potassium chloride SA (K-DUR,KLOR-CON) 20 MEQ tablet Take 1 tablet (20 mEq total) by mouth as directed. daily x 7 days  7 tablet  0  . potassium chloride SA (K-DUR,KLOR-CON) 20 MEQ tablet Take 1 tablet (20 mEq  total) by mouth daily.  30 tablet  0  . pregabalin (LYRICA) 50 MG capsule Take 1 capsule (50 mg total) by mouth 3 (three) times daily.  270 capsule  1  . warfarin (COUMADIN) 5 MG tablet TAKE 7.5MG (1 AND 1/2) DAILY EXCEPT 5MG (1 TAB) ON TUES, THURS, AND SAT, OR AS DIRECTED  40 tablet  2   No current facility-administered medications for this visit.   Facility-Administered Medications Ordered in Other Visits  Medication Dose Route Frequency Provider Last Rate Last Dose  . sodium chloride 0.9 % injection 10 mL  10 mL Intracatheter PRN Si Gaul, MD   10 mL at 07/27/13 1617  . sodium chloride 0.9 % injection 10 mL  10 mL Intracatheter PRN Conni Slipper, PA-C   10 mL at 08/23/13 1514    SURGICAL HISTORY:  Past Surgical History  Procedure Laterality Date  . Video bronchoscopy  12/11/2012    Procedure: VIDEO BRONCHOSCOPY;  Surgeon: Kerin Perna, MD;  Location: [facility] OR;  Service: Thoracic;  Laterality: N/A;  . Video assisted thoracoscopy  12/11/2012    Procedure: VIDEO ASSISTED THORACOSCOPY;  Surgeon: Kerin Perna, MD;  Location: [facility] OR;  Service: Thoracic;  Laterality: Right;  . Decortication  12/11/2012    Procedure: DECORTICATION;  Surgeon: Kerin Perna, MD;  Location: [facility] OR;  Service: Thoracic;  Laterality: N/A;    REVIEW OF SYSTEMS:  A comprehensive review of systems was negative except for: Constitutional: positive for fatigue   PHYSICAL EXAMINATION: General appearance: alert, cooperative, fatigued and no distress Head: Normocephalic, without obvious abnormality, atraumatic Neck: no adenopathy Lymph nodes: Cervical, supraclavicular, and axillary nodes normal. Resp: clear to auscultation bilaterally Cardio: regular rate and rhythm, S1, S2 normal, no murmur, click, rub or gallop GI: soft, non-tender; bowel sounds normal; no masses,  no organomegaly Extremities: extremities normal, atraumatic, no cyanosis or edema Neurologic: Alert and oriented X 3, normal strength and tone.  Normal symmetric reflexes. Normal coordination and gait Skin: Generally better moisturized in general, there were no areas of skin breakdown, Subcutaneous nodule felt in the left lower mid abdomen, approximately one centimeter in size, nontender  ECOG PERFORMANCE STATUS: 1 - Symptomatic but completely ambulatory  Blood pressure 149/79, pulse 78, temperature 98.9 F (37.2 C), temperature source Oral, resp. rate 18, height 5\' 3"  (1.6 m), weight 227 lb 6.4 oz (103.148 kg).  LABORATORY DATA: Lab Results  Component Value Date   WBC 3.9* 08/23/2013   HGB 8.9* 08/23/2013   HCT 26.7* 08/23/2013   MCV 93.4 08/23/2013   PLT 80* 08/23/2013      Chemistry      Component Value Date/Time   NA 136 08/23/2013 0906   NA 136 03/16/2013 1930   K 3.7 08/23/2013 0906   K 3.8 03/16/2013 1930  CL 112* 05/10/2013 0849   CL 110 03/16/2013 1930   CO2 21* 08/23/2013 0906   CO2 16* 03/16/2013 1930   BUN 10.9 08/23/2013 0906   BUN 22 03/16/2013 1930   CREATININE 0.8 08/23/2013 0906   CREATININE 1.24 03/16/2013 1930      Component Value Date/Time   CALCIUM 9.4 08/23/2013 0906   CALCIUM 8.2* 03/16/2013 1930   ALKPHOS 27* 08/23/2013 0906   ALKPHOS 31* 03/16/2013 1930   AST 10 08/23/2013 0906   AST 11 03/16/2013 1930   ALT 8 08/23/2013 0906   ALT 15 03/16/2013 1930   BILITOT 0.82 08/23/2013 0906   BILITOT 0.2* 03/16/2013 1930       RADIOGRAPHIC STUDIES: Dg Chest 2 View  07/05/2013   *RADIOLOGY REPORT*  Clinical Data: Cough.  History of multiple myeloma.  CHEST - 2 VIEW  Comparison: PA and lateral chest 02/05/2013 and 04/19/2013.  Findings: Port-A-Cath remains in place.  Lungs are clear.  No pneumothorax or pleural effusion.  Likely osteotomy right sixth rib is again seen.  IMPRESSION: No acute finding.   Original Report Authenticated By: Holley Dexter, M.D.    ASSESSMENT AND PLAN: This is a very pleasant 52 years old African American male with recurrent multiple myeloma currently on systemic chemotherapy with  Carfilzomib, Cytoxan and dexamethasone status post 3 cycles with improvement in his disease.  Patient was discussed with an also seen by Dr. Arbutus Ped. He will proceed with day 15 of cycle 4 of his systemic chemotherapy with Carfilzomib, Cytoxan and dexamethasone today as scheduled. He'll return in one week for repeat protein studies to reevaluate his disease. He'll follow with Dr. Arbutus Ped in 2 weeks to discuss the results of his protein studies as it  relates to his disease and continued treatment. A prescription for Lyrica 50 mg, one by mouth 3 times daily, a total of 270 with 1 refill was called to his pharmacy of record.   Laural Benes, Phi Avans E, PA-C   He was advised to call immediately if he has any concerning symptoms in the interval. The patient voices understanding of current disease status and treatment options and is in agreement with the current care plan.  All questions were answered. The patient knows to call the clinic with any problems, questions or concerns. We can certainly see the patient much sooner if necessary.  ADDENDUM: Hematology/Oncology Attending: I had a face to face encounter with the patient today. I recommended his care plan. This is a very pleasant 52 years old [place] American male with recurrent multiple myeloma currently undergoing systemic chemotherapy with Carfilzomib, cyclophosphamide and Decadron status post 3 cycles and currently undergoing cycle #4. The patient is tolerating his treatment fairly well. He will continue on day 15 and 16 of the fourth cycle as scheduled. We will repeat myeloma panel next week and the patient would come back for followup visit in 2 weeks for evaluation before starting cycle #5. The patient was advised to call immediately if he has any concerning symptoms in the interval. Lajuana Matte., MD 08/23/2013

## 2013-08-23 NOTE — Telephone Encounter (Signed)
gv and printed appt sch3ed and avs for pt for OCT and NOV....MW added tx. °

## 2013-08-23 NOTE — Progress Notes (Signed)
INR = 1.8 on Coumadin 5 mg/day; 7.5 mg on Mondays Pt missed his Coumadin dose this past Friday. No sxs of vasculitis/thrombosis. INR subtherapeutic due to omitted dose. Pt will take Coumadin 10 mg today (rather than 7.5 mg) then return to usual dose. Repeat protime in 2 weeks when here for tx.  We'll see him in infusion. Ebony Hail, Pharm.D., CPP 08/23/2013@9 :47 AM

## 2013-08-24 ENCOUNTER — Ambulatory Visit (HOSPITAL_BASED_OUTPATIENT_CLINIC_OR_DEPARTMENT_OTHER): Payer: 59

## 2013-08-24 VITALS — BP 151/82 | HR 71 | Temp 97.9°F | Resp 20

## 2013-08-24 DIAGNOSIS — C9000 Multiple myeloma not having achieved remission: Secondary | ICD-10-CM

## 2013-08-24 DIAGNOSIS — C9002 Multiple myeloma in relapse: Secondary | ICD-10-CM

## 2013-08-24 DIAGNOSIS — Z5112 Encounter for antineoplastic immunotherapy: Secondary | ICD-10-CM

## 2013-08-24 DIAGNOSIS — I776 Arteritis, unspecified: Secondary | ICD-10-CM

## 2013-08-24 MED ORDER — WARFARIN SODIUM 5 MG PO TABS: ORAL_TABLET | ORAL | Status: DC

## 2013-08-24 MED ORDER — SODIUM CHLORIDE 0.9 % IJ SOLN
10.0000 mL | INTRAMUSCULAR | Status: DC | PRN
  Administered 2013-08-24: 10 mL
  Filled 2013-08-24: qty 10

## 2013-08-24 MED ORDER — SODIUM CHLORIDE 0.9 % IV SOLN
Freq: Once | INTRAVENOUS | Status: AC
  Administered 2013-08-24: 10:00:00 via INTRAVENOUS

## 2013-08-24 MED ORDER — HEPARIN SOD (PORK) LOCK FLUSH 100 UNIT/ML IV SOLN
500.0000 [IU] | Freq: Once | INTRAVENOUS | Status: AC | PRN
  Administered 2013-08-24: 500 [IU]
  Filled 2013-08-24: qty 5

## 2013-08-24 MED ORDER — ONDANSETRON 8 MG/50ML IVPB (CHCC)
8.0000 mg | Freq: Once | INTRAVENOUS | Status: AC
  Administered 2013-08-24: 8 mg via INTRAVENOUS

## 2013-08-24 MED ORDER — DEXTROSE 5 % IV SOLN
27.0000 mg/m2 | Freq: Once | INTRAVENOUS | Status: AC
  Administered 2013-08-24: 56 mg via INTRAVENOUS
  Filled 2013-08-24: qty 28

## 2013-08-24 MED ORDER — ONDANSETRON 8 MG/NS 50 ML IVPB
INTRAVENOUS | Status: AC
  Filled 2013-08-24: qty 8

## 2013-08-24 MED ORDER — DEXAMETHASONE SODIUM PHOSPHATE 10 MG/ML IJ SOLN
INTRAMUSCULAR | Status: AC
  Filled 2013-08-24: qty 1

## 2013-08-24 MED ORDER — DEXAMETHASONE SODIUM PHOSPHATE 10 MG/ML IJ SOLN
10.0000 mg | Freq: Once | INTRAMUSCULAR | Status: AC
  Administered 2013-08-24: 10 mg via INTRAVENOUS

## 2013-08-24 NOTE — Patient Instructions (Addendum)
[facility] Discharge Instructions for Patients Receiving Chemotherapy  Today you received the following chemotherapy agent: Kyprolis  To help prevent nausea and vomiting after your treatment, we encourage you to take your nausea medication as prescribed.    If you develop nausea and vomiting that is not controlled by your nausea medication, call the clinic.   BELOW ARE SYMPTOMS THAT SHOULD BE REPORTED IMMEDIATELY:  *FEVER GREATER THAN 100.5 F  *CHILLS WITH OR WITHOUT FEVER  NAUSEA AND VOMITING THAT IS NOT CONTROLLED WITH YOUR NAUSEA MEDICATION  *UNUSUAL SHORTNESS OF BREATH  *UNUSUAL BRUISING OR BLEEDING  TENDERNESS IN MOUTH AND THROAT WITH OR WITHOUT PRESENCE OF ULCERS  *URINARY PROBLEMS  *BOWEL PROBLEMS  UNUSUAL RASH Items with * indicate a potential emergency and should be followed up as soon as possible.  Feel free to call the clinic you have any questions or concerns. The clinic phone number is [phone].    

## 2013-08-30 ENCOUNTER — Other Ambulatory Visit (HOSPITAL_BASED_OUTPATIENT_CLINIC_OR_DEPARTMENT_OTHER): Payer: 59 | Admitting: Lab

## 2013-08-30 DIAGNOSIS — C9000 Multiple myeloma not having achieved remission: Secondary | ICD-10-CM

## 2013-08-30 DIAGNOSIS — C9002 Multiple myeloma in relapse: Secondary | ICD-10-CM

## 2013-08-30 LAB — COMPREHENSIVE METABOLIC PANEL (CC13)
ALT: 7 U/L (ref 0–55)
AST: 10 U/L (ref 5–34)
Albumin: 2.1 g/dL — ABNORMAL LOW (ref 3.5–5.0)
Anion Gap: 8 mEq/L (ref 3–11)
CO2: 19 mEq/L — ABNORMAL LOW (ref 22–29)
Calcium: 8.3 mg/dL — ABNORMAL LOW (ref 8.4–10.4)
Chloride: 112 mEq/L — ABNORMAL HIGH (ref 98–109)
Creatinine: 0.9 mg/dL (ref 0.7–1.3)
Potassium: 3.3 mEq/L — ABNORMAL LOW (ref 3.5–5.1)
Sodium: 139 mEq/L (ref 136–145)
Total Bilirubin: 0.75 mg/dL (ref 0.20–1.20)

## 2013-08-30 LAB — CBC WITH DIFFERENTIAL/PLATELET
BASO%: 0.4 % (ref 0.0–2.0)
Eosinophils Absolute: 0 10*3/uL (ref 0.0–0.5)
HCT: 24.4 % — ABNORMAL LOW (ref 38.4–49.9)
LYMPH%: 16.1 % (ref 14.0–49.0)
MONO#: 0.4 10*3/uL (ref 0.1–0.9)
NEUT#: 1.7 10*3/uL (ref 1.5–6.5)
NEUT%: 68.2 % (ref 39.0–75.0)
Platelets: 71 10*3/uL — ABNORMAL LOW (ref 140–400)
RBC: 2.6 10*6/uL — ABNORMAL LOW (ref 4.20–5.82)
WBC: 2.5 10*3/uL — ABNORMAL LOW (ref 4.0–10.3)
lymph#: 0.4 10*3/uL — ABNORMAL LOW (ref 0.9–3.3)
nRBC: 4 % — ABNORMAL HIGH (ref 0–0)

## 2013-08-31 LAB — KAPPA/LAMBDA LIGHT CHAINS: Kappa:Lambda Ratio: 577.78 — ABNORMAL HIGH (ref 0.26–1.65)

## 2013-08-31 LAB — IGG, IGA, IGM
IgA: <7 mg/dL — ABNORMAL LOW (ref 68–379)
IgG (Immunoglobin G), Serum: 4940 mg/dL — ABNORMAL HIGH (ref 650–1600)

## 2013-09-03 ENCOUNTER — Telehealth: Payer: Self-pay | Admitting: Medical Oncology

## 2013-09-03 NOTE — Telephone Encounter (Signed)
I left message for pt to increase food high in potassium and gave him examples of these foods.

## 2013-09-03 NOTE — Telephone Encounter (Signed)
Message copied by Charma Igo on Fri Sep 03, 2013  3:27 PM ------      Message from: Conni Slipper      Created: Fri Sep 03, 2013 12:39 PM       Abnormal results, please call  and notify patient to increase intake of potassium rich foods ------

## 2013-09-06 ENCOUNTER — Other Ambulatory Visit (HOSPITAL_BASED_OUTPATIENT_CLINIC_OR_DEPARTMENT_OTHER): Payer: 59 | Admitting: Lab

## 2013-09-06 ENCOUNTER — Encounter: Payer: Self-pay | Admitting: Internal Medicine

## 2013-09-06 ENCOUNTER — Ambulatory Visit (HOSPITAL_BASED_OUTPATIENT_CLINIC_OR_DEPARTMENT_OTHER): Payer: 59 | Admitting: Internal Medicine

## 2013-09-06 ENCOUNTER — Telehealth: Payer: Self-pay | Admitting: Internal Medicine

## 2013-09-06 ENCOUNTER — Ambulatory Visit (HOSPITAL_BASED_OUTPATIENT_CLINIC_OR_DEPARTMENT_OTHER): Payer: 59 | Admitting: Pharmacist

## 2013-09-06 ENCOUNTER — Ambulatory Visit: Payer: 59

## 2013-09-06 VITALS — BP 139/71 | HR 79 | Temp 98.7°F | Resp 20 | Ht 63.0 in | Wt 222.8 lb

## 2013-09-06 DIAGNOSIS — C9000 Multiple myeloma not having achieved remission: Secondary | ICD-10-CM

## 2013-09-06 DIAGNOSIS — R110 Nausea: Secondary | ICD-10-CM

## 2013-09-06 DIAGNOSIS — I776 Arteritis, unspecified: Secondary | ICD-10-CM

## 2013-09-06 DIAGNOSIS — C9002 Multiple myeloma in relapse: Secondary | ICD-10-CM

## 2013-09-06 LAB — CBC WITH DIFFERENTIAL/PLATELET
Basophils Absolute: 0 10*3/uL (ref 0.0–0.1)
EOS%: 0.6 % (ref 0.0–7.0)
Eosinophils Absolute: 0 10*3/uL (ref 0.0–0.5)
HCT: 25.7 % — ABNORMAL LOW (ref 38.4–49.9)
HGB: 8.3 g/dL — ABNORMAL LOW (ref 13.0–17.1)
LYMPH%: 25 % (ref 14.0–49.0)
MCH: 31 pg (ref 27.2–33.4)
MCV: 95.9 fL (ref 79.3–98.0)
MONO%: 14.3 % — ABNORMAL HIGH (ref 0.0–14.0)
NEUT#: 2 10*3/uL (ref 1.5–6.5)
NEUT%: 59.5 % (ref 39.0–75.0)
Platelets: 144 10*3/uL (ref 140–400)
RDW: 19.2 % — ABNORMAL HIGH (ref 11.0–14.6)

## 2013-09-06 LAB — PROTIME-INR: INR: 3.6 — ABNORMAL HIGH (ref 2.00–3.50)

## 2013-09-06 LAB — POCT INR: INR: 3.6

## 2013-09-06 MED ORDER — PROCHLORPERAZINE MALEATE 10 MG PO TABS: 10.0000 mg | ORAL_TABLET | Freq: Four times a day (QID) | ORAL | Status: DC | PRN

## 2013-09-06 NOTE — Patient Instructions (Signed)
Referral to [facility] for second opinion. Followup visit in 3 weeks.

## 2013-09-06 NOTE — Patient Instructions (Signed)
INR above goal today No coumadin today Then continue 5 mg daily except for 7.5 mg on Mondays Recheck PT/INR with next scheduled lab/appointments on 09/27/13. Lab at 845am, CC at 9am, MD visit at 9:15am

## 2013-09-06 NOTE — Telephone Encounter (Signed)
gv and printed appt sched and avs forpt for NOV.Marland KitchenMarland KitchenPrinted and gv to [employer].

## 2013-09-06 NOTE — Progress Notes (Signed)
INR elevated today at 3.6. Pt seen by Dr. Arbutus Ped today. No treatment today due to progression of disease Pt reports no complications regarding anticoagulation No missed doses or extra doses or diet changes Plan to hold coumadin tonight then Continue 5 mg daily except for 7.5 mg on Mondays Recheck PT/INR with next scheduled lab/appointments on 09/27/13. Lab at 845am, CC at 9am, MD visit at 9:15am

## 2013-09-06 NOTE — Progress Notes (Signed)
[facility] [phone]   [phone]  OFFICE PROGRESS NOTE  [name], MD [address] [place] [address]  Principle Diagnosis:  #1 recurrent multiple myeloma IgG kappa subtype diagnosed in June of 2008  #2 history of vasculitis and thrombosis of skin lesions   Prior Therapy:  #1 status post palliative radiotherapy to the left hip under the care of Dr. Roselind Messier  #2 status post 5 cycles of systemic chemotherapy with Revlimid and low dose Decadron. Last dose given June 2009 with good response.  #3 status post autologous peripheral blood stem cell transplant at [facility] on 02/02/2008.  #4 the patient had evidence for disease recurrence in December 2010.  #5 Revlimid 25 mg by mouth daily for 21 days every 4 weeks in addition to Decadron 40 mg orally on a weekly basis. The patient is status post 28 cycles, discontinued today secondary to disease progression.  #6 Systemic chemotherapy with Velcade 1.3 mg/M2 on days 1, 4, 8 and 11 in addition to Doxil 30 mg/M2 on day 4 and Decadron 40 mg by mouth on weekly basis every 3 weeks. Status post 3 cycles, last dose was given 05/04/2012 discontinued secondary to intolerance.  #7 Salvage therapy treatment with the Pomalyst 4 mg by mouth daily for 21 days every 28 days as well as Cytoxan 50 mg by mouth every other day and dexamethasone 40 mg by mouth once weekly. Therapy beginning 09/25/2012, status post 2 cycles discontinued secondary to disease progression and intolerance.  #8 Kyprolis (Carfilzomib) 27 mg/M2 on days 1, 2, 8, 9, 15 and 16 every 4 weeks. First dose on 02/08/2013. This would be concurrent with the dexamethasone 40 mg by mouth on a weekly basis. Status post 3 cycles with disease progression.   Current therapy:  1) Systemic chemotherapy with Carfilzomib 27 mg/M2 days 1,2 , 8, 9, 15 and 16, Cytoxan 300 mg/M2 and dexamethasone 40 mg by mouth weekly every 4 weeks. Status  post 4 cycles. Last dose was given 08/09/2013 discontinued today secondary to disease progression. 2) Zometa 4 mg IV given every 3 months.    INTERVAL HISTORY: [name] 52 y.o. male returns to the clinic today for followup visit accompanied by his wife. The patient is feeling fine today with no specific complaints except for persistent fatigue and occasional pain in the epigastric area as well as nausea. The patient did not take his nausea medication because he was out of it. He denied having any significant chest pain, shortness breath, cough or hemoptysis. The patient denied having any weight loss or night sweats. He tolerated the last cycle of his treatment fairly well. He had repeat myeloma panel recently and he is here for evaluation and discussion of his lab results.  MEDICAL HISTORY: Past Medical History  Diagnosis Date  . Multiple myeloma 09/24/2011  . Hypertension     ALLERGIES:  is allergic to red dye and other.  MEDICATIONS:  Current Outpatient Prescriptions  Medication Sig Dispense Refill  . amLODipine (NORVASC) 10 MG tablet Take 10 mg by mouth daily.      . benzonatate (TESSALON) 200 MG capsule Take 200 mg by mouth 3 (three) times daily as needed.       [name] dexamethasone (DECADRON) 4 MG tablet Take 10 tablets (40 mg total) by mouth once a week. Takes 40 mg Q Monday  40 tablet  2  . HYDROcodone-acetaminophen (NORCO) 10-325 MG per tablet Take 1-2  tablets by mouth every 6 (six) hours as needed for pain. For pain.  40 tablet  0  . Nebivolol HCl (BYSTOLIC) 20 MG TABS Take 30 mg by mouth daily.      [name] omeprazole (PRILOSEC) 40 MG capsule Take 40 mg by mouth daily as needed (for heartburn).       [name] oxyCODONE-acetaminophen (PERCOCET/ROXICET) 5-325 MG per tablet Take 1 tablet by mouth every 6 (six) hours as needed for pain.  40 tablet  0  . potassium chloride SA (K-DUR,KLOR-CON) 20 MEQ tablet Take 1 tablet (20 mEq total) by mouth daily.  30 tablet  0  . pregabalin (LYRICA) 50 MG  capsule Take 1 capsule (50 mg total) by mouth 3 (three) times daily.  270 capsule  1  . warfarin (COUMADIN) 5 MG tablet Take 5 mg by mouth daily except 7.5 mg on Mondays or as directed.  40 tablet  2   No current facility-administered medications for this visit.   Facility-Administered Medications Ordered in Other Visits  Medication Dose Route Frequency Provider Last Rate Last Dose  . sodium chloride 0.9 % injection 10 mL  10 mL Intracatheter PRN Si Gaul, MD   10 mL at 07/27/13 1617    SURGICAL HISTORY:  Past Surgical History  Procedure Laterality Date  . Video bronchoscopy  12/11/2012    Procedure: VIDEO BRONCHOSCOPY;  Surgeon: Kerin Perna, MD;  Location: [facility] OR;  Service: Thoracic;  Laterality: N/A;  . Video assisted thoracoscopy  12/11/2012    Procedure: VIDEO ASSISTED THORACOSCOPY;  Surgeon: Kerin Perna, MD;  Location: [facility] OR;  Service: Thoracic;  Laterality: Right;  . Decortication  12/11/2012    Procedure: DECORTICATION;  Surgeon: Kerin Perna, MD;  Location: [facility] OR;  Service: Thoracic;  Laterality: N/A;    REVIEW OF SYSTEMS:  Constitutional: positive for fatigue Eyes: negative Ears, nose, mouth, throat, and face: negative Respiratory: negative Cardiovascular: negative Gastrointestinal: positive for nausea Genitourinary:negative Integument/breast: negative Hematologic/lymphatic: negative Musculoskeletal:negative Neurological: negative Behavioral/Psych: negative Endocrine: negative Allergic/Immunologic: negative   PHYSICAL EXAMINATION: General appearance: alert, cooperative, fatigued and no distress Head: Normocephalic, without obvious abnormality, atraumatic Neck: no adenopathy, no JVD, supple, symmetrical, trachea midline and thyroid not enlarged, symmetric, no tenderness/mass/nodules Lymph nodes: Cervical, supraclavicular, and axillary nodes normal. Resp: clear to auscultation bilaterally Back: symmetric, no curvature. ROM normal. No CVA  tenderness. Cardio: regular rate and rhythm, S1, S2 normal, no murmur, click, rub or gallop GI: soft, non-tender; bowel sounds normal; no masses,  no organomegaly Extremities: extremities normal, atraumatic, no cyanosis or edema Neurologic: Alert and oriented X 3, normal strength and tone. Normal symmetric reflexes. Normal coordination and gait  ECOG PERFORMANCE STATUS: 1 - Symptomatic but completely ambulatory  Blood pressure 139/71, pulse 79, temperature 98.7 F (37.1 C), temperature source Oral, resp. rate 20, height 5\' 3"  (1.6 m), weight 222 lb 12.8 oz (101.061 kg).  LABORATORY DATA: Lab Results  Component Value Date   WBC 3.4* 09/06/2013   HGB 8.3* 09/06/2013   HCT 25.7* 09/06/2013   MCV 95.9 09/06/2013   PLT 144 09/06/2013      Chemistry      Component Value Date/Time   NA 139 08/30/2013 0810   NA 136 03/16/2013 1930   K 3.3* 08/30/2013 0810   K 3.8 03/16/2013 1930   CL 112* 05/10/2013 0849   CL 110 03/16/2013 1930   CO2 19* 08/30/2013 0810   CO2 16* 03/16/2013 1930   BUN 7.7 08/30/2013 0810   BUN 22  03/16/2013 1930   CREATININE 0.9 08/30/2013 0810   CREATININE 1.24 03/16/2013 1930      Component Value Date/Time   CALCIUM 8.3* 08/30/2013 0810   CALCIUM 8.2* 03/16/2013 1930   ALKPHOS 31* 08/30/2013 0810   ALKPHOS 31* 03/16/2013 1930   AST 10 08/30/2013 0810   AST 11 03/16/2013 1930   ALT 7 08/30/2013 0810   ALT 15 03/16/2013 1930   BILITOT 0.75 08/30/2013 0810   BILITOT 0.2* 03/16/2013 1930     Other lab results: IVIG 3950, IgA less than 7 and IgM less than 4. Beta-2 microglobulin 5.77, kappa light chain 156, free lambda light chain 0.27 and kappa/lambda ratio of 577.78  RADIOGRAPHIC STUDIES: No results found.  ASSESSMENT AND PLAN: This is a very pleasant 52 years old African American male with recurrent multiple myeloma most recently treated with 4 cycles with Carfilzomib, Cytoxan and Decadron but unfortunately his recent myeloma panel showed evidence for disease  progression. I have a lengthy discussion with the patient and his wife today about his condition. I recommended for the patient to discontinue treatment with Carfilzomib, Cytoxan and Decadron at this point. I will refer the patient to Dr. Marissa Calamity of his partner at [facility] for evaluation and consideration of enrollment in clinical trials if available or for discussion of other treatment options. For nausea, I gave the patient refill of Compazine 10 mg by mouth every 6 hours as needed. The patient would come back for followup visit in 3 weeks for reevaluation. He was advised to call immediately if she has any concerning symptoms in the interval. The patient voices understanding of current disease status and treatment options and is in agreement with the current care plan.  All questions were answered. The patient knows to call the clinic with any problems, questions or concerns. We can certainly see the patient much sooner if necessary.  I spent 15 minutes counseling the patient face to face. The total time spent in the appointment was 25 minutes.

## 2013-09-07 ENCOUNTER — Telehealth: Payer: Self-pay | Admitting: Internal Medicine

## 2013-09-07 ENCOUNTER — Ambulatory Visit: Payer: 59

## 2013-09-07 NOTE — Telephone Encounter (Signed)
FAXED PT MEDICAL RECORDS TO DR VOORHEES OFFICE.  DR VOORHEES OFFICE WILL REVIEW RECORDS AND CALL WITH APPT.

## 2013-09-08 ENCOUNTER — Telehealth: Payer: Self-pay | Admitting: Internal Medicine

## 2013-09-08 NOTE — Telephone Encounter (Signed)
Pt appt. With Dr.Voorhees is 09/14/13@1 :30. Medical records faxed. Pt is aware

## 2013-09-13 ENCOUNTER — Ambulatory Visit: Payer: 59

## 2013-09-13 ENCOUNTER — Other Ambulatory Visit: Payer: 59

## 2013-09-14 ENCOUNTER — Ambulatory Visit: Payer: 59

## 2013-09-20 ENCOUNTER — Ambulatory Visit: Payer: 59

## 2013-09-20 ENCOUNTER — Other Ambulatory Visit: Payer: 59 | Admitting: Lab

## 2013-09-21 ENCOUNTER — Ambulatory Visit: Payer: 59

## 2013-09-27 ENCOUNTER — Encounter: Payer: Self-pay | Admitting: Internal Medicine

## 2013-09-27 ENCOUNTER — Ambulatory Visit: Payer: 59 | Admitting: Pharmacist

## 2013-09-27 ENCOUNTER — Ambulatory Visit (HOSPITAL_COMMUNITY)
Admission: RE | Admit: 2013-09-27 | Discharge: 2013-09-27 | Disposition: A | Discharge status: Home / Self Care | Payer: 59 | Source: Ambulatory Visit | Attending: Internal Medicine | Admitting: Internal Medicine

## 2013-09-27 ENCOUNTER — Telehealth: Payer: Self-pay | Admitting: Internal Medicine

## 2013-09-27 ENCOUNTER — Other Ambulatory Visit: Payer: Self-pay | Admitting: *Deleted

## 2013-09-27 ENCOUNTER — Ambulatory Visit: Payer: 59

## 2013-09-27 ENCOUNTER — Ambulatory Visit: Payer: 59 | Admitting: Lab

## 2013-09-27 ENCOUNTER — Ambulatory Visit (HOSPITAL_BASED_OUTPATIENT_CLINIC_OR_DEPARTMENT_OTHER): Payer: 59 | Admitting: Internal Medicine

## 2013-09-27 ENCOUNTER — Other Ambulatory Visit (HOSPITAL_BASED_OUTPATIENT_CLINIC_OR_DEPARTMENT_OTHER): Payer: 59

## 2013-09-27 VITALS — BP 145/66 | HR 81 | Temp 98.4°F | Resp 18 | Ht 63.0 in | Wt 220.1 lb

## 2013-09-27 DIAGNOSIS — C9000 Multiple myeloma not having achieved remission: Secondary | ICD-10-CM

## 2013-09-27 DIAGNOSIS — M25559 Pain in unspecified hip: Secondary | ICD-10-CM

## 2013-09-27 DIAGNOSIS — G629 Polyneuropathy, unspecified: Secondary | ICD-10-CM

## 2013-09-27 DIAGNOSIS — C9002 Multiple myeloma in relapse: Secondary | ICD-10-CM

## 2013-09-27 DIAGNOSIS — D649 Anemia, unspecified: Secondary | ICD-10-CM

## 2013-09-27 DIAGNOSIS — R5381 Other malaise: Secondary | ICD-10-CM

## 2013-09-27 DIAGNOSIS — M545 Low back pain: Secondary | ICD-10-CM

## 2013-09-27 DIAGNOSIS — E875 Hyperkalemia: Secondary | ICD-10-CM

## 2013-09-27 DIAGNOSIS — G8918 Other acute postprocedural pain: Secondary | ICD-10-CM

## 2013-09-27 DIAGNOSIS — I776 Arteritis, unspecified: Secondary | ICD-10-CM

## 2013-09-27 LAB — CBC WITH DIFFERENTIAL/PLATELET
Eosinophils Absolute: 0 10*3/uL (ref 0.0–0.5)
HCT: 22.9 % — ABNORMAL LOW (ref 38.4–49.9)
HGB: 7.3 g/dL — ABNORMAL LOW (ref 13.0–17.1)
LYMPH%: 20 % (ref 14.0–49.0)
MCV: 95.4 fL (ref 79.3–98.0)
MONO#: 0.6 10*3/uL (ref 0.1–0.9)
NEUT#: 3 10*3/uL (ref 1.5–6.5)
NEUT%: 65.7 % (ref 39.0–75.0)
Platelets: 121 10*3/uL — ABNORMAL LOW (ref 140–400)
RBC: 2.4 10*6/uL — ABNORMAL LOW (ref 4.20–5.82)
WBC: 4.5 10*3/uL (ref 4.0–10.3)
lymph#: 0.9 10*3/uL (ref 0.9–3.3)
nRBC: 7 % — ABNORMAL HIGH (ref 0–0)

## 2013-09-27 LAB — COMPREHENSIVE METABOLIC PANEL (CC13)
AST: 15 U/L (ref 5–34)
Albumin: 2.6 g/dL — ABNORMAL LOW (ref 3.5–5.0)
Alkaline Phosphatase: 38 U/L — ABNORMAL LOW (ref 40–150)
Calcium: 9.5 mg/dL (ref 8.4–10.4)
Glucose: 96 mg/dl (ref 70–140)
Potassium: 2.8 mEq/L — CL (ref 3.5–5.1)
Sodium: 137 mEq/L (ref 136–145)
Total Bilirubin: 0.82 mg/dL (ref 0.20–1.20)
Total Protein: 11.6 g/dL — ABNORMAL HIGH (ref 6.4–8.3)

## 2013-09-27 LAB — LACTATE DEHYDROGENASE (CC13): LDH: 452 U/L — ABNORMAL HIGH (ref 125–245)

## 2013-09-27 LAB — PROTIME-INR
INR: 1.5 — ABNORMAL LOW (ref 2.00–3.50)
Protime: 18 Seconds — ABNORMAL HIGH (ref 10.6–13.4)

## 2013-09-27 LAB — TECHNOLOGIST REVIEW

## 2013-09-27 MED ORDER — SODIUM CHLORIDE 0.9 % IJ SOLN
10.0000 mL | INTRAMUSCULAR | Status: DC | PRN
  Administered 2013-09-27: 10 mL via INTRAVENOUS
  Filled 2013-09-27: qty 10

## 2013-09-27 MED ORDER — HEPARIN SOD (PORK) LOCK FLUSH 100 UNIT/ML IV SOLN
500.0000 [IU] | Freq: Once | INTRAVENOUS | Status: AC
  Administered 2013-09-27: 500 [IU] via INTRAVENOUS
  Filled 2013-09-27: qty 5

## 2013-09-27 MED ORDER — HYDROCODONE-ACETAMINOPHEN 10-325 MG PO TABS: 1.0000 | ORAL_TABLET | Freq: Four times a day (QID) | ORAL | Status: DC | PRN

## 2013-09-27 MED ORDER — LIDOCAINE 5 % EX PTCH: 1.0000 | MEDICATED_PATCH | CUTANEOUS | Status: DC

## 2013-09-27 NOTE — Progress Notes (Signed)
INR = 1.5 Hgb = 7.3; Pltc = 121 Pt arrived to clinic today w/ his wife pushing him in a wheelchair. Pt has had disease progression so has been off chemo.  He went to [facility] last Monday for consultation w/ Dr. Marissa Calamity.  A bone marrow bx was performed at that appt.  Pt states since the bx, he can "barely walk."  He has to lift his R leg to move it today in clinic.  Pts wife states pts pain has been worse & none of his pain meds are helping to relieve the pain. Pt had 2 episodes of hematuria the first of last week.  He passed a clot. He also developed bleeding from his gums 2 weeks ago.  His wife states she called here 2 weeks ago to report the bleeding (she thought it was hematemesis) but no one ever followed back up w/ her on this.  She told Dr. Asa Lente RN that she s/w the on call MD. Pt has continued to have bleeding from his gums on a daily basis.  It happens spontaneously.  Understandably, he has avoided eating, especially on the front teeth.  He states his teeth "hurt." Due to the bleeding episodes, he has been off his Coumadin.  He did start back a few days but the bleeding worsened so he stopped taking Coumadin again. Pt also has had bilateral LE edema.  His wife noticed it on Thur/Fri last week when she was putting on pts socks.  It is not as bad today they say. The pt & his wife are very frustrated at this point about the lack of follow up re: bleeding.  They saw Dr. Arbutus Ped today.   Per Dr Arbutus Ped, pt should stay on previous dose of Coumadin (5 mg/day; 7.5 mg on Mon).   Plan for myeloma is to start on Kyprolis + Pomalyst. We'll recheck pts protime next week when he is here for labs & possible tx. Ebony Hail, Pharm.D., CPP 09/27/2013@11 :07 AM

## 2013-09-27 NOTE — Telephone Encounter (Signed)
gv and printed appt sched and avs for pt for NOV...sed added tx. °

## 2013-09-27 NOTE — Patient Instructions (Signed)
We discussed with treatment options today including increasing the dose of Carfilzomib, Pomalyst and Decadron.

## 2013-09-27 NOTE — Progress Notes (Signed)
Per pt request, pt needs walker and bedside commode.  Orders sent through EP[facility] to [facility].  SLJ

## 2013-09-27 NOTE — Progress Notes (Addendum)
[facility] Telephone:([phone]   Fax:([phone]  OFFICE PROGRESS NOTE  [name], MD [address] [place] [address]  Principle Diagnosis:  #1 recurrent multiple myeloma IgG kappa subtype diagnosed in June of 2008  #2 history of vasculitis and thrombosis of skin lesions   Prior Therapy:  #1 status post palliative radiotherapy to the left hip under the care of Dr. Roselind Messier  #2 status post 5 cycles of systemic chemotherapy with Revlimid and low dose Decadron. Last dose given June 2009 with good response.  #3 status post autologous peripheral blood stem cell transplant at [facility] on 02/02/2008.  #4 the patient had evidence for disease recurrence in December 2010.  #5 Revlimid 25 mg by mouth daily for 21 days every 4 weeks in addition to Decadron 40 mg orally on a weekly basis. The patient is status post 28 cycles, discontinued today secondary to disease progression.  #6 Systemic chemotherapy with Velcade 1.3 mg/M2 on days 1, 4, 8 and 11 in addition to Doxil 30 mg/M2 on day 4 and Decadron 40 mg by mouth on weekly basis every 3 weeks. Status post 3 cycles, last dose was given 05/04/2012 discontinued secondary to intolerance.  #7 Salvage therapy treatment with the Pomalyst 4 mg by mouth daily for 21 days every 28 days as well as Cytoxan 50 mg by mouth every other day and dexamethasone 40 mg by mouth once weekly. Therapy beginning 09/25/2012, status post 2 cycles discontinued secondary to disease progression and intolerance.  #8 Kyprolis (Carfilzomib) 27 mg/M2 on days 1, 2, 8, 9, 15 and 16 every 4 weeks. First dose on 02/08/2013. This would be concurrent with the dexamethasone 40 mg by mouth on a weekly basis. Status post 3 cycles with disease progression.  #9 Systemic chemotherapy with Carfilzomib 27 mg/M2 days 1,2 , 8, 9, 15 and 16, Cytoxan 300 mg/M2 and dexamethasone 40 mg by mouth weekly every 4 weeks. Status post 4 cycles. Last  dose was given 08/09/2013 discontinued today secondary to disease progression.  Current therapy:  1) Systemic chemotherapy with Carfilzomib 56 mg/M2 days 1,2 , 8, 9, 15 and 16, Pomalyst 3 mg by mouth daily for 21 days every 4 weeks and dexamethasone 40 mg by mouth weekly every 4 weeks. For a cycle 10/05/2013. 2) Zometa 4 mg IV given every 3 months.    INTERVAL HISTORY: [name] 52 y.o. male returns to the clinic today for followup visit accompanied by his wife. The patient is complaining of increasing fatigue and weakness as well as pain on the lower back and right hip started after the bone marrow biopsy and aspirate that was performed at [facility]. He is currently on pain medication in the form of Percocet, Lyrica and Norco but with minimal improvement. He was recently seen by Dr. Marissa Calamity at [facility] for second opinion regarding his disease progression. Dr. face recommended for the patient treatment with increasing the dose of Carfilzomib to 56 mg/M2, in addition to Pomalyst 3 mg by mouth daily for 21 days every 4 weeks as well as weekly Decadron. The patient is here today for evaluation and resuming his systemic therapy. He denied having any other complaints today. He had one episode of hematuria last week and he passed a blood clot but no further hematuria or urinary tract problems since that time. He also continues to have gum bleed and has not seen his dentist for years. The  bone marrow biopsy and aspirate that was performed at [facility] showed Involved by plasma cell neoplasm (48% plasma cells by touch preparation differential, 70% plasma cells by CD56 immunohistochemical analysis of core, and monotypic kappa by in-situ hybridization. Hypercellular bone marrow (>95%) with reduced trilineage hematopoiesis. The patient denied having any other significant complaints. He denied having any fever or chills. He has no nausea or vomiting.   MEDICAL HISTORY: Past Medical History   Diagnosis Date  . Multiple myeloma 09/24/2011  . Hypertension     ALLERGIES:  is allergic to red dye and other.  MEDICATIONS:  Current Outpatient Prescriptions  Medication Sig Dispense Refill  . amLODipine (NORVASC) 10 MG tablet Take 10 mg by mouth daily.      . benzonatate (TESSALON) 200 MG capsule Take 200 mg by mouth 3 (three) times daily as needed.       [name] dexamethasone (DECADRON) 4 MG tablet Take 10 tablets (40 mg total) by mouth once a week. Takes 40 mg Q Monday  40 tablet  2  . HYDROcodone-acetaminophen (NORCO) 10-325 MG per tablet Take 1-2 tablets by mouth every 6 (six) hours as needed. For pain.  40 tablet  0  . lidocaine (LIDODERM) 5 % Place 1 patch onto the skin daily. Remove & Discard patch within 12 hours or as directed by MD  30 patch  0  . Nebivolol HCl (BYSTOLIC) 20 MG TABS Take 30 mg by mouth daily.      [name] omeprazole (PRILOSEC) 40 MG capsule Take 40 mg by mouth daily as needed (for heartburn).       [name] oxyCODONE-acetaminophen (PERCOCET/ROXICET) 5-325 MG per tablet Take 1 tablet by mouth every 6 (six) hours as needed for pain.  40 tablet  0  . potassium chloride SA (K-DUR,KLOR-CON) 20 MEQ tablet Take 1 tablet (20 mEq total) by mouth daily.  30 tablet  0  . pregabalin (LYRICA) 50 MG capsule Take 1 capsule (50 mg total) by mouth 3 (three) times daily.  270 capsule  1  . prochlorperazine (COMPAZINE) 10 MG tablet Take 1 tablet (10 mg total) by mouth every 6 (six) hours as needed.  60 tablet  0  . warfarin (COUMADIN) 5 MG tablet Take 5 mg by mouth daily except 7.5 mg on Mondays or as directed.  40 tablet  2   No current facility-administered medications for this visit.   Facility-Administered Medications Ordered in Other Visits  Medication Dose Route Frequency Provider Last Rate Last Dose  . sodium chloride 0.9 % injection 10 mL  10 mL Intracatheter PRN Si Gaul, MD   10 mL at 07/27/13 1617    SURGICAL HISTORY:  Past Surgical History  Procedure Laterality Date  .  Video bronchoscopy  12/11/2012    Procedure: VIDEO BRONCHOSCOPY;  Surgeon: Kerin Perna, MD;  Location: [facility] OR;  Service: Thoracic;  Laterality: N/A;  . Video assisted thoracoscopy  12/11/2012    Procedure: VIDEO ASSISTED THORACOSCOPY;  Surgeon: Kerin Perna, MD;  Location: [facility] OR;  Service: Thoracic;  Laterality: Right;  . Decortication  12/11/2012    Procedure: DECORTICATION;  Surgeon: Kerin Perna, MD;  Location: [facility] OR;  Service: Thoracic;  Laterality: N/A;    REVIEW OF SYSTEMS:  Constitutional: positive for fatigue Eyes: negative Ears, nose, mouth, throat, and face: negative Respiratory: negative Cardiovascular: negative Gastrointestinal: positive for nausea Genitourinary:negative Integument/breast: negative Hematologic/lymphatic: negative Musculoskeletal:negative Neurological: negative Behavioral/Psych: negative Endocrine: negative Allergic/Immunologic: negative   PHYSICAL EXAMINATION: General appearance: alert, cooperative,  fatigued and no distress Head: Normocephalic, without obvious abnormality, atraumatic Neck: no adenopathy, no JVD, supple, symmetrical, trachea midline and thyroid not enlarged, symmetric, no tenderness/mass/nodules Lymph nodes: Cervical, supraclavicular, and axillary nodes normal. Resp: clear to auscultation bilaterally Back: symmetric, no curvature. ROM normal. No CVA tenderness. Cardio: regular rate and rhythm, S1, S2 normal, no murmur, click, rub or gallop GI: soft, non-tender; bowel sounds normal; no masses,  no organomegaly Extremities: extremities normal, atraumatic, no cyanosis or edema Neurologic: Alert and oriented X 3, normal strength and tone. Normal symmetric reflexes. Normal coordination and gait  ECOG PERFORMANCE STATUS: 1 - Symptomatic but completely ambulatory  Blood pressure 145/66, pulse 81, temperature 98.4 F (36.9 C), temperature source Oral, resp. rate 18, height 5\' 3"  (1.6 m), weight 220 lb 1.6 oz (99.837 kg), SpO2  100.00%.  LABORATORY DATA: Lab Results  Component Value Date   WBC 4.5 09/27/2013   HGB 7.3* 09/27/2013   HCT 22.9* 09/27/2013   MCV 95.4 09/27/2013   PLT 121 Large & giant platelets* 09/27/2013      Chemistry      Component Value Date/Time   NA 137 09/27/2013 0908   NA 136 03/16/2013 1930   K 2.8* 09/27/2013 0908   K 3.8 03/16/2013 1930   CL 112* 05/10/2013 0849   CL 110 03/16/2013 1930   CO2 17* 09/27/2013 0908   CO2 16* 03/16/2013 1930   BUN 7.4 09/27/2013 0908   BUN 22 03/16/2013 1930   CREATININE 1.0 09/27/2013 0908   CREATININE 1.24 03/16/2013 1930      Component Value Date/Time   CALCIUM 9.5 09/27/2013 0908   CALCIUM 8.2* 03/16/2013 1930   ALKPHOS 38* 09/27/2013 0908   ALKPHOS 31* 03/16/2013 1930   AST 15 09/27/2013 0908   AST 11 03/16/2013 1930   ALT 6 09/27/2013 0908   ALT 15 03/16/2013 1930   BILITOT 0.82 09/27/2013 0908   BILITOT 0.2* 03/16/2013 1930      Accession #: [id number] from [facility]. 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  Comment:  The plasma cells demonstrate significantly blastic morphology as well apparent  partial loss of CD138 expression by immunohistochemistry. Correlation with  clinical, serologic and radiologic features will be of interest.    ASSESSMENT AND PLAN: This is a very pleasant 52 years old [place] American male with recurrent multiple myeloma most recently treated with 4 cycles with Carfilzomib, Cytoxan and Decadron but unfortunately his recent myeloma panel showed evidence for disease  progression. I have a lengthy discussion with the patient and his wife today about his condition. I would consider the patient for treatment with Carfilzomib 56 mg/M2 on days 1, 2, 8, 9, 15, 16 every 4 weeks in addition to Pomalyst 3 mg by mouth daily for 21 days every 4 weeks as well as Decadron 40 mg by mouth on a weekly basis based on the recommendation of Dr. Marissa Calamity. I expect addition to start the first cycle of this treatment on 10/05/2013. I discussed with him the adverse effect of this treatment including but not limited to alopecia, myelosuppression, nausea and vomiting, peripheral neuropathy,  liver, cardiac or renal dysfunction. For the gum bleed, I advised the patient to see his dentist for evaluation. For the low back and right hip pain, I will start the patient on lidocaine patch in addition to his current pain medication with Percocet, Norco and Lyrica. He was given a refill of Norco today. Hyperkalemia, the patient would be started on K Dur 40 meq po qd X 7 days. The patient would come back for followup visit in 2 weeks for reevaluation. He was advised to call immediately if she has any concerning symptoms in the interval. The patient voices understanding of current disease status and treatment options and is in agreement with the current care plan.  All questions were answered. The patient knows to call the clinic with any problems, questions or concerns. We can certainly see the patient much sooner if necessary.  I spent 20 minutes counseling the patient face to face. The total time spent in the appointment was 30 minutes.

## 2013-09-28 ENCOUNTER — Telehealth: Payer: Self-pay

## 2013-09-28 ENCOUNTER — Encounter: Payer: Self-pay | Admitting: Internal Medicine

## 2013-09-28 ENCOUNTER — Other Ambulatory Visit: Payer: Self-pay | Admitting: *Deleted

## 2013-09-28 NOTE — Telephone Encounter (Signed)
Told Ms. Ruane that [facility] does have the order for the equiptment. They are working on the order now and will call her when processed.  It. Hopefully will be today.  Call the office tomorrow if no call from [facility]. Told Ms. Prins that the patient survey for pomalyst needs to be done for the medication to be filled.   Gave her the [employer] number to call and process survey.

## 2013-09-28 NOTE — Progress Notes (Signed)
Put wife's fmla form on nurse's desk. °

## 2013-09-29 ENCOUNTER — Ambulatory Visit (HOSPITAL_BASED_OUTPATIENT_CLINIC_OR_DEPARTMENT_OTHER): Payer: 59

## 2013-09-29 ENCOUNTER — Other Ambulatory Visit: Payer: Self-pay | Admitting: *Deleted

## 2013-09-29 VITALS — BP 119/68 | HR 73 | Temp 99.3°F | Resp 16

## 2013-09-29 DIAGNOSIS — C9002 Multiple myeloma in relapse: Secondary | ICD-10-CM

## 2013-09-29 DIAGNOSIS — D6481 Anemia due to antineoplastic chemotherapy: Secondary | ICD-10-CM

## 2013-09-29 DIAGNOSIS — C9000 Multiple myeloma not having achieved remission: Secondary | ICD-10-CM

## 2013-09-29 LAB — PREPARE RBC (CROSSMATCH)

## 2013-09-29 MED ORDER — DIPHENHYDRAMINE HCL 25 MG PO CAPS
25.0000 mg | ORAL_CAPSULE | Freq: Once | ORAL | Status: AC
  Administered 2013-09-29: 25 mg via ORAL

## 2013-09-29 MED ORDER — ACETAMINOPHEN 325 MG PO TABS
ORAL_TABLET | ORAL | Status: AC
  Filled 2013-09-29: qty 2

## 2013-09-29 MED ORDER — DIPHENHYDRAMINE HCL 25 MG PO CAPS
ORAL_CAPSULE | ORAL | Status: AC
  Filled 2013-09-29: qty 1

## 2013-09-29 MED ORDER — SODIUM CHLORIDE 0.9 % IJ SOLN
10.0000 mL | INTRAMUSCULAR | Status: AC | PRN
Start: 2013-09-29 — End: 2013-09-29
  Administered 2013-09-29: 10 mL
  Filled 2013-09-29: qty 10

## 2013-09-29 MED ORDER — ACETAMINOPHEN 325 MG PO TABS
650.0000 mg | ORAL_TABLET | Freq: Once | ORAL | Status: AC
  Administered 2013-09-29: 650 mg via ORAL

## 2013-09-29 MED ORDER — POMALIDOMIDE 3 MG PO CAPS: 3.0000 mg | ORAL_CAPSULE | Freq: Every day | ORAL | Status: DC

## 2013-09-29 MED ORDER — HEPARIN SOD (PORK) LOCK FLUSH 100 UNIT/ML IV SOLN
500.0000 [IU] | Freq: Every day | INTRAVENOUS | Status: AC | PRN
  Administered 2013-09-29: 500 [IU]
  Filled 2013-09-29: qty 5

## 2013-09-29 MED ORDER — SODIUM CHLORIDE 0.9 % IV SOLN
250.0000 mL | Freq: Once | INTRAVENOUS | Status: AC
  Administered 2013-09-29: 250 mL via INTRAVENOUS

## 2013-09-29 NOTE — Patient Instructions (Signed)
Blood Transfusion Information WHAT IS A BLOOD TRANSFUSION? A transfusion is the replacement of blood or some of its parts. Blood is made up of multiple cells which provide different functions.  Red blood cells carry oxygen and are used for blood loss replacement.  White blood cells fight against infection.  Platelets control bleeding.  Plasma helps clot blood.  Other blood products are available for specialized needs, such as hemophilia or other clotting disorders. BEFORE THE TRANSFUSION  Who gives blood for transfusions?   You may be able to donate blood to be used at a later date on yourself (autologous donation).  Relatives can be asked to donate blood. This is generally not any safer than if you have received blood from a stranger. The same precautions are taken to ensure safety when a relative's blood is donated.  Healthy volunteers who are fully evaluated to make sure their blood is safe. This is blood bank blood. Transfusion therapy is the safest it has ever been in the practice of medicine. Before blood is taken from a donor, a complete history is taken to make sure that person has no history of diseases nor engages in risky social behavior (examples are intravenous drug use or sexual activity with multiple partners). The donor's travel history is screened to minimize risk of transmitting infections, such as malaria. The donated blood is tested for signs of infectious diseases, such as HIV and hepatitis. The blood is then tested to be sure it is compatible with you in order to minimize the chance of a transfusion reaction. If you or a relative donates blood, this is often done in anticipation of surgery and is not appropriate for emergency situations. It takes many days to process the donated blood. RISKS AND COMPLICATIONS Although transfusion therapy is very safe and saves many lives, the main dangers of transfusion include:   Getting an infectious disease.  Developing a  transfusion reaction. This is an allergic reaction to something in the blood you were given. Every precaution is taken to prevent this. The decision to have a blood transfusion has been considered carefully by your caregiver before blood is given. Blood is not given unless the benefits outweigh the risks. AFTER THE TRANSFUSION  Right after receiving a blood transfusion, you will usually feel much better and more energetic. This is especially true if your red blood cells have gotten low (anemic). The transfusion raises the level of the red blood cells which carry oxygen, and this usually causes an energy increase.  The nurse administering the transfusion will monitor you carefully for complications. HOME CARE INSTRUCTIONS  No special instructions are needed after a transfusion. You may find your energy is better. Speak with your caregiver about any limitations on activity for underlying diseases you may have. SEEK MEDICAL CARE IF:   Your condition is not improving after your transfusion.  You develop redness or irritation at the intravenous (IV) site. SEEK IMMEDIATE MEDICAL CARE IF:  Any of the following symptoms occur over the next 12 hours:  Shaking chills.  You have a temperature by mouth above 102 F (38.9 C), not controlled by medicine.  Chest, back, or muscle pain.  People around you feel you are not acting correctly or are confused.  Shortness of breath or difficulty breathing.  Dizziness and fainting.  You get a rash or develop hives.  You have a decrease in urine output.  Your urine turns a dark color or changes to pink, red, or brown. Any of the following   symptoms occur over the next 10 days:  You have a temperature by mouth above 102 F (38.9 C), not controlled by medicine.  Shortness of breath.  Weakness after normal activity.  The white part of the eye turns yellow (jaundice).  You have a decrease in the amount of urine or are urinating less often.  Your  urine turns a dark color or changes to pink, red, or brown. Document Released: 11/01/2000 Document Revised: 01/27/2012 Document Reviewed: 06/20/2008 Exit[employer] Patient Information 2014 [employer].  

## 2013-09-30 ENCOUNTER — Encounter: Payer: Self-pay | Admitting: Internal Medicine

## 2013-09-30 ENCOUNTER — Other Ambulatory Visit: Payer: Self-pay | Admitting: *Deleted

## 2013-09-30 ENCOUNTER — Telehealth: Payer: Self-pay | Admitting: *Deleted

## 2013-09-30 DIAGNOSIS — C9000 Multiple myeloma not having achieved remission: Secondary | ICD-10-CM

## 2013-09-30 LAB — TYPE AND SCREEN
ABO/RH(D): O POS
Donor AG Type: NEGATIVE
Donor AG Type: NEGATIVE
Unit division: 0

## 2013-09-30 MED ORDER — POMALIDOMIDE 3 MG PO CAPS: 3.0000 mg | ORAL_CAPSULE | Freq: Every day | ORAL | Status: DC

## 2013-09-30 NOTE — Progress Notes (Signed)
Faxed fmla form to [employer] @ [phone] °

## 2013-10-01 ENCOUNTER — Telehealth: Payer: Self-pay | Admitting: *Deleted

## 2013-10-01 DIAGNOSIS — C9000 Multiple myeloma not having achieved remission: Secondary | ICD-10-CM

## 2013-10-01 MED ORDER — LEVOFLOXACIN 500 MG PO TABS: 500.0000 mg | ORAL_TABLET | Freq: Every day | ORAL | Status: DC

## 2013-10-01 NOTE — Telephone Encounter (Signed)
Pt called stating that he has a scratchy throat, productive cough with yellow sputum.  He wanted to see if we could call in an antibiotic for him.  He states he does not think he has a fever, he had a low grade fever yesterday when he was receiving his blood transfusion.  Per AJ, okay to call in Levaquin 500mg  daily x 5 days.  Pt verbalized understanding.  SLJ

## 2013-10-05 ENCOUNTER — Telehealth: Payer: Self-pay | Admitting: Medical Oncology

## 2013-10-05 ENCOUNTER — Other Ambulatory Visit (HOSPITAL_BASED_OUTPATIENT_CLINIC_OR_DEPARTMENT_OTHER): Payer: 59 | Admitting: Lab

## 2013-10-05 ENCOUNTER — Ambulatory Visit (HOSPITAL_BASED_OUTPATIENT_CLINIC_OR_DEPARTMENT_OTHER): Payer: 59

## 2013-10-05 ENCOUNTER — Ambulatory Visit: Payer: 59 | Admitting: Pharmacist

## 2013-10-05 ENCOUNTER — Other Ambulatory Visit: Payer: Self-pay | Admitting: Physician Assistant

## 2013-10-05 VITALS — BP 132/73 | HR 79 | Temp 98.5°F | Resp 20

## 2013-10-05 DIAGNOSIS — C9002 Multiple myeloma in relapse: Secondary | ICD-10-CM

## 2013-10-05 DIAGNOSIS — I776 Arteritis, unspecified: Secondary | ICD-10-CM

## 2013-10-05 DIAGNOSIS — Z5112 Encounter for antineoplastic immunotherapy: Secondary | ICD-10-CM

## 2013-10-05 DIAGNOSIS — C9000 Multiple myeloma not having achieved remission: Secondary | ICD-10-CM

## 2013-10-05 LAB — CBC WITH DIFFERENTIAL/PLATELET
BASO%: 1.4 % (ref 0.0–2.0)
Basophils Absolute: 0.1 10*3/uL (ref 0.0–0.1)
EOS%: 2.8 % (ref 0.0–7.0)
HCT: 27.9 % — ABNORMAL LOW (ref 38.4–49.9)
HGB: 9.3 g/dL — ABNORMAL LOW (ref 13.0–17.1)
LYMPH%: 24.4 % (ref 14.0–49.0)
MCH: 30.7 pg (ref 27.2–33.4)
MCHC: 33.3 g/dL (ref 32.0–36.0)
MCV: 92.1 fL (ref 79.3–98.0)
MONO%: 16.7 % — ABNORMAL HIGH (ref 0.0–14.0)
NEUT%: 54.7 % (ref 39.0–75.0)
Platelets: 98 10*3/uL — ABNORMAL LOW (ref 140–400)
WBC: 3.5 10*3/uL — ABNORMAL LOW (ref 4.0–10.3)
lymph#: 0.9 10*3/uL (ref 0.9–3.3)

## 2013-10-05 LAB — PROTIME-INR
INR: 3.3 (ref 2.00–3.50)
Protime: 39.6 Seconds — ABNORMAL HIGH (ref 10.6–13.4)

## 2013-10-05 LAB — COMPREHENSIVE METABOLIC PANEL (CC13)
ALT: <6 U/L (ref 0–55)
AST: 18 U/L (ref 5–34)
Albumin: 2.5 g/dL — ABNORMAL LOW (ref 3.5–5.0)
Alkaline Phosphatase: 36 U/L — ABNORMAL LOW (ref 40–150)
BUN: 7.7 mg/dL (ref 7.0–26.0)
Calcium: 9.2 mg/dL (ref 8.4–10.4)
Creatinine: 0.9 mg/dL (ref 0.7–1.3)
Glucose: 89 mg/dl (ref 70–140)
Potassium: 3.3 mEq/L — ABNORMAL LOW (ref 3.5–5.1)
Total Bilirubin: 0.54 mg/dL (ref 0.20–1.20)

## 2013-10-05 MED ORDER — DEXAMETHASONE SODIUM PHOSPHATE 10 MG/ML IJ SOLN
10.0000 mg | Freq: Once | INTRAMUSCULAR | Status: AC
  Administered 2013-10-05: 10 mg via INTRAVENOUS

## 2013-10-05 MED ORDER — CARFILZOMIB CHEMO INJECTION 60 MG: 56.0000 mg/m2 | Freq: Once | INTRAVENOUS | Status: DC

## 2013-10-05 MED ORDER — SODIUM CHLORIDE 0.9 % IJ SOLN
10.0000 mL | INTRAMUSCULAR | Status: DC | PRN
  Administered 2013-10-05: 10 mL
  Filled 2013-10-05: qty 10

## 2013-10-05 MED ORDER — ONDANSETRON 8 MG/NS 50 ML IVPB
INTRAVENOUS | Status: AC
  Filled 2013-10-05: qty 8

## 2013-10-05 MED ORDER — DEXAMETHASONE SODIUM PHOSPHATE 10 MG/ML IJ SOLN
INTRAMUSCULAR | Status: AC
  Filled 2013-10-05: qty 1

## 2013-10-05 MED ORDER — HEPARIN SOD (PORK) LOCK FLUSH 100 UNIT/ML IV SOLN
500.0000 [IU] | Freq: Once | INTRAVENOUS | Status: AC | PRN
  Administered 2013-10-05: 500 [IU]
  Filled 2013-10-05: qty 5

## 2013-10-05 MED ORDER — ONDANSETRON 8 MG/50ML IVPB (CHCC)
8.0000 mg | Freq: Once | INTRAVENOUS | Status: AC
  Administered 2013-10-05: 8 mg via INTRAVENOUS

## 2013-10-05 MED ORDER — SODIUM CHLORIDE 0.9 % IV SOLN
Freq: Once | INTRAVENOUS | Status: AC
  Administered 2013-10-05: 09:00:00 via INTRAVENOUS

## 2013-10-05 MED ORDER — DEXTROSE 5 % IV SOLN
56.0000 mg/m2 | Freq: Once | INTRAVENOUS | Status: AC
  Administered 2013-10-05: 118 mg via INTRAVENOUS
  Filled 2013-10-05: qty 59

## 2013-10-05 NOTE — Patient Instructions (Signed)
INR just above goal Take a 1/2 tablet (2.5 mg) today only.  Then Continue 5 mg daily except for 7.5 mg on Mondays Recheck PT/INR with next scheduled lab/appointments next week on 10/12/13

## 2013-10-05 NOTE — Telephone Encounter (Signed)
Pt called and said he had pomalyst 4 mg tablets. Per Dr Asa Lente instructions  I told pt he can take the pomalyst 4 mg tablets until the new 3mg  tablets arrive. Then he should stop the 4 mg tablet and take the 3 mg tablet. He voiced understanding.

## 2013-10-05 NOTE — Progress Notes (Signed)
Per Dr. Arbutus Ped, OK to treat with a platelet count of 98 today.

## 2013-10-05 NOTE — Patient Instructions (Signed)
[facility] Discharge Instructions for Patients Receiving Chemotherapy  Today you received the following chemotherapy agents Kyprolis To help prevent nausea and vomiting after your treatment, we encourage you to take your nausea medication as prescribed.  If you develop nausea and vomiting that is not controlled by your nausea medication, call the clinic.   BELOW ARE SYMPTOMS THAT SHOULD BE REPORTED IMMEDIATELY:  *FEVER GREATER THAN 100.5 F  *CHILLS WITH OR WITHOUT FEVER  NAUSEA AND VOMITING THAT IS NOT CONTROLLED WITH YOUR NAUSEA MEDICATION  *UNUSUAL SHORTNESS OF BREATH  *UNUSUAL BRUISING OR BLEEDING  TENDERNESS IN MOUTH AND THROAT WITH OR WITHOUT PRESENCE OF ULCERS  *URINARY PROBLEMS  *BOWEL PROBLEMS  UNUSUAL RASH Items with * indicate a potential emergency and should be followed up as soon as possible.  Feel free to call the clinic you have any questions or concerns. The clinic phone number is [phone].    

## 2013-10-05 NOTE — Progress Notes (Signed)
INR just above goal today Pt is doing well with no complaints No recent bleeding or bruising (no nose bleeds) No missed doses or extra doses Pt was given a 5 day course of Levaquin last week on Friday and he took his last dose today This can elevate the INR and is the likely cause for the elevation today. [name] reports no other changes Plan: Take a 1/2 tablet (2.5 mg) today only.  Then Continue 5 mg daily except for 7.5 mg on Mondays Recheck PT/INR with next scheduled lab/appointments next week on 10/12/13

## 2013-10-05 NOTE — Telephone Encounter (Signed)
[employer] called . Pt has new insurance and pomalyst has to be filled by [facility] . [facility] rep will fax pomalyst rx to [facility]. I called pt to take kdur 40 meq po daily x 10 days . Pt voices understanding.

## 2013-10-06 ENCOUNTER — Telehealth: Payer: Self-pay | Admitting: Medical Oncology

## 2013-10-06 ENCOUNTER — Ambulatory Visit (HOSPITAL_BASED_OUTPATIENT_CLINIC_OR_DEPARTMENT_OTHER): Payer: 59

## 2013-10-06 ENCOUNTER — Encounter (HOSPITAL_COMMUNITY): Payer: Self-pay

## 2013-10-06 ENCOUNTER — Ambulatory Visit (HOSPITAL_COMMUNITY)
Admission: RE | Admit: 2013-10-06 | Discharge: 2013-10-06 | Disposition: A | Discharge status: Home / Self Care | Payer: 59 | Source: Ambulatory Visit | Attending: Physician Assistant | Admitting: Physician Assistant

## 2013-10-06 ENCOUNTER — Other Ambulatory Visit: Payer: Self-pay | Admitting: Physician Assistant

## 2013-10-06 VITALS — BP 148/73 | HR 83 | Temp 97.0°F

## 2013-10-06 DIAGNOSIS — R0600 Dyspnea, unspecified: Secondary | ICD-10-CM

## 2013-10-06 DIAGNOSIS — M8448XA Pathological fracture, other site, initial encounter for fracture: Secondary | ICD-10-CM | POA: Insufficient documentation

## 2013-10-06 DIAGNOSIS — C9000 Multiple myeloma not having achieved remission: Secondary | ICD-10-CM

## 2013-10-06 DIAGNOSIS — J90 Pleural effusion, not elsewhere classified: Secondary | ICD-10-CM | POA: Insufficient documentation

## 2013-10-06 DIAGNOSIS — C9002 Multiple myeloma in relapse: Secondary | ICD-10-CM

## 2013-10-06 DIAGNOSIS — Z5112 Encounter for antineoplastic immunotherapy: Secondary | ICD-10-CM

## 2013-10-06 MED ORDER — SODIUM CHLORIDE 0.9 % IJ SOLN
10.0000 mL | INTRAMUSCULAR | Status: DC | PRN
  Administered 2013-10-06: 10 mL
  Filled 2013-10-06: qty 10

## 2013-10-06 MED ORDER — DEXTROSE 5 % IV SOLN
56.0000 mg/m2 | Freq: Once | INTRAVENOUS | Status: AC
  Administered 2013-10-06: 118 mg via INTRAVENOUS
  Filled 2013-10-06: qty 59

## 2013-10-06 MED ORDER — ONDANSETRON 8 MG/NS 50 ML IVPB
INTRAVENOUS | Status: AC
  Filled 2013-10-06: qty 8

## 2013-10-06 MED ORDER — IOHEXOL 350 MG/ML SOLN
100.0000 mL | Freq: Once | INTRAVENOUS | Status: AC | PRN
  Administered 2013-10-06: 100 mL via INTRAVENOUS

## 2013-10-06 MED ORDER — DEXAMETHASONE SODIUM PHOSPHATE 10 MG/ML IJ SOLN
INTRAMUSCULAR | Status: AC
  Filled 2013-10-06: qty 1

## 2013-10-06 MED ORDER — SODIUM CHLORIDE 0.9 % IV SOLN
Freq: Once | INTRAVENOUS | Status: AC
  Administered 2013-10-06: 09:00:00 via INTRAVENOUS

## 2013-10-06 MED ORDER — HEPARIN SOD (PORK) LOCK FLUSH 100 UNIT/ML IV SOLN
500.0000 [IU] | Freq: Once | INTRAVENOUS | Status: AC | PRN
  Administered 2013-10-06: 500 [IU]
  Filled 2013-10-06: qty 5

## 2013-10-06 MED ORDER — DEXAMETHASONE SODIUM PHOSPHATE 10 MG/ML IJ SOLN
10.0000 mg | Freq: Once | INTRAMUSCULAR | Status: AC
  Administered 2013-10-06: 10 mg via INTRAVENOUS

## 2013-10-06 MED ORDER — ONDANSETRON 8 MG/50ML IVPB (CHCC)
8.0000 mg | Freq: Once | INTRAVENOUS | Status: AC
  Administered 2013-10-06: 8 mg via INTRAVENOUS

## 2013-10-06 NOTE — Telephone Encounter (Signed)
I left a message that pomalyst is coming from [facility] due to insurance changes.

## 2013-10-06 NOTE — Patient Instructions (Signed)
[facility] Discharge Instructions for Patients Receiving Chemotherapy  Today you received the following chemotherapy agents Kyprolis To help prevent nausea and vomiting after your treatment, we encourage you to take your nausea medication as prescribed.  If you develop nausea and vomiting that is not controlled by your nausea medication, call the clinic.   BELOW ARE SYMPTOMS THAT SHOULD BE REPORTED IMMEDIATELY:  *FEVER GREATER THAN 100.5 F  *CHILLS WITH OR WITHOUT FEVER  NAUSEA AND VOMITING THAT IS NOT CONTROLLED WITH YOUR NAUSEA MEDICATION  *UNUSUAL SHORTNESS OF BREATH  *UNUSUAL BRUISING OR BLEEDING  TENDERNESS IN MOUTH AND THROAT WITH OR WITHOUT PRESENCE OF ULCERS  *URINARY PROBLEMS  *BOWEL PROBLEMS  UNUSUAL RASH Items with * indicate a potential emergency and should be followed up as soon as possible.  Feel free to call the clinic you have any questions or concerns. The clinic phone number is [phone].    

## 2013-10-07 ENCOUNTER — Other Ambulatory Visit: Payer: Self-pay | Admitting: Physician Assistant

## 2013-10-07 ENCOUNTER — Encounter: Payer: Self-pay | Admitting: Internal Medicine

## 2013-10-07 ENCOUNTER — Other Ambulatory Visit: Payer: Self-pay | Admitting: *Deleted

## 2013-10-07 ENCOUNTER — Telehealth: Payer: Self-pay | Admitting: *Deleted

## 2013-10-07 DIAGNOSIS — C9000 Multiple myeloma not having achieved remission: Secondary | ICD-10-CM

## 2013-10-07 MED ORDER — LEVOFLOXACIN 500 MG PO TABS: 500.0000 mg | ORAL_TABLET | Freq: Every day | ORAL | Status: DC

## 2013-10-07 MED ORDER — HYDROCODONE-HOMATROPINE 5-1.5 MG/5ML PO SYRP: 5.0000 mL | ORAL_SOLUTION | Freq: Four times a day (QID) | ORAL | Status: DC | PRN

## 2013-10-07 NOTE — Progress Notes (Signed)
Faxed pa form to [facility] for pomalyst

## 2013-10-07 NOTE — Telephone Encounter (Signed)
Pt called stating he still has not heard about the pomalyst being delivered.  Called and spoke to [facility].  They are awaiting prior authorization.  The # to call is [phone].  Called and left a message with Karel Jarvis in medical management with this information.  SLJ

## 2013-10-08 ENCOUNTER — Encounter: Payer: Self-pay | Admitting: Internal Medicine

## 2013-10-08 NOTE — Progress Notes (Signed)
[facility] approved pomalyst from 10/07/13-10/08/15.

## 2013-10-12 ENCOUNTER — Ambulatory Visit (HOSPITAL_BASED_OUTPATIENT_CLINIC_OR_DEPARTMENT_OTHER): Payer: 59 | Admitting: Physician Assistant

## 2013-10-12 ENCOUNTER — Ambulatory Visit: Payer: 59

## 2013-10-12 ENCOUNTER — Ambulatory Visit: Payer: 59 | Admitting: Pharmacist

## 2013-10-12 ENCOUNTER — Telehealth: Payer: Self-pay | Admitting: Internal Medicine

## 2013-10-12 ENCOUNTER — Encounter: Payer: Self-pay | Admitting: *Deleted

## 2013-10-12 ENCOUNTER — Other Ambulatory Visit (HOSPITAL_BASED_OUTPATIENT_CLINIC_OR_DEPARTMENT_OTHER): Payer: 59 | Admitting: Lab

## 2013-10-12 ENCOUNTER — Encounter: Payer: Self-pay | Admitting: Physician Assistant

## 2013-10-12 VITALS — BP 142/65 | HR 74 | Temp 98.4°F | Resp 18 | Ht 63.0 in | Wt 217.3 lb

## 2013-10-12 DIAGNOSIS — C9002 Multiple myeloma in relapse: Secondary | ICD-10-CM

## 2013-10-12 DIAGNOSIS — T451X5A Adverse effect of antineoplastic and immunosuppressive drugs, initial encounter: Secondary | ICD-10-CM

## 2013-10-12 DIAGNOSIS — D6959 Other secondary thrombocytopenia: Secondary | ICD-10-CM

## 2013-10-12 DIAGNOSIS — C9000 Multiple myeloma not having achieved remission: Secondary | ICD-10-CM

## 2013-10-12 DIAGNOSIS — I776 Arteritis, unspecified: Secondary | ICD-10-CM

## 2013-10-12 DIAGNOSIS — R52 Pain, unspecified: Secondary | ICD-10-CM

## 2013-10-12 LAB — CBC WITH DIFFERENTIAL/PLATELET
Basophils Absolute: 0 10*3/uL (ref 0.0–0.1)
Eosinophils Absolute: 0.1 10*3/uL (ref 0.0–0.5)
HCT: 24.7 % — ABNORMAL LOW (ref 38.4–49.9)
LYMPH%: 23.7 % (ref 14.0–49.0)
MCHC: 32.8 g/dL (ref 32.0–36.0)
MCV: 92.2 fL (ref 79.3–98.0)
MONO#: 0.7 10*3/uL (ref 0.1–0.9)
MONO%: 23 % — ABNORMAL HIGH (ref 0.0–14.0)
NEUT#: 1.4 10*3/uL — ABNORMAL LOW (ref 1.5–6.5)
NEUT%: 49.8 % (ref 39.0–75.0)
Platelets: 35 10*3/uL — ABNORMAL LOW (ref 140–400)
RBC: 2.68 10*6/uL — ABNORMAL LOW (ref 4.20–5.82)

## 2013-10-12 LAB — TECHNOLOGIST REVIEW

## 2013-10-12 LAB — POCT INR: INR: 1.4

## 2013-10-12 LAB — PROTIME-INR
INR: 1.4 — ABNORMAL LOW (ref 2.00–3.50)
Protime: 16.8 Seconds — ABNORMAL HIGH (ref 10.6–13.4)

## 2013-10-12 MED ORDER — DEXAMETHASONE 4 MG PO TABS: 40.0000 mg | ORAL_TABLET | ORAL | Status: DC

## 2013-10-12 MED ORDER — MORPHINE SULFATE ER 30 MG PO TBCR: 30.0000 mg | EXTENDED_RELEASE_TABLET | Freq: Two times a day (BID) | ORAL | Status: DC

## 2013-10-12 NOTE — Progress Notes (Signed)
RECEIVED A FAX FROM [facility] CONCERNING A CONFIRMATION OF PRESCRIPTION SHIPMENT FOR POMALYST ON 10/08/13.

## 2013-10-12 NOTE — Patient Instructions (Signed)
Her chemotherapy is being held today because your platelet count is too low to proceed with chemotherapy as scheduled Start taking MS Contin 30 mg by mouth every 12 hours for pain He may continue taking your hydrocodone 1 tablet every 4-6 hours as needed for breakthrough pain Followup in one week for another symptom management visit and reevaluation of your laboratory data

## 2013-10-12 NOTE — Progress Notes (Addendum)
[facility] [phone]   [phone]  SHARED VISIT PROGRESS NOTE  [name], MD [address] [place] [address]  Principle Diagnosis:  #1 recurrent multiple myeloma IgG kappa subtype diagnosed in June of 2008  #2 history of vasculitis and thrombosis of skin lesions   Prior Therapy:  #1 status post palliative radiotherapy to the left hip under the care of Dr. Roselind Messier  #2 status post 5 cycles of systemic chemotherapy with Revlimid and low dose Decadron. Last dose given June 2009 with good response.  #3 status post autologous peripheral blood stem cell transplant at [facility] on 02/02/2008.  #4 the patient had evidence for disease recurrence in December 2010.  #5 Revlimid 25 mg by mouth daily for 21 days every 4 weeks in addition to Decadron 40 mg orally on a weekly basis. The patient is status post 28 cycles, discontinued today secondary to disease progression.  #6 Systemic chemotherapy with Velcade 1.3 mg/M2 on days 1, 4, 8 and 11 in addition to Doxil 30 mg/M2 on day 4 and Decadron 40 mg by mouth on weekly basis every 3 weeks. Status post 3 cycles, last dose was given 05/04/2012 discontinued secondary to intolerance.  #7 Salvage therapy treatment with the Pomalyst 4 mg by mouth daily for 21 days every 28 days as well as Cytoxan 50 mg by mouth every other day and dexamethasone 40 mg by mouth once weekly. Therapy beginning 09/25/2012, status post 2 cycles discontinued secondary to disease progression and intolerance.  #8 Kyprolis (Carfilzomib) 27 mg/M2 on days 1, 2, 8, 9, 15 and 16 every 4 weeks. First dose on 02/08/2013. This would be concurrent with the dexamethasone 40 mg by mouth on a weekly basis. Status post 3 cycles with disease progression.  #9 Systemic chemotherapy with Carfilzomib 27 mg/M2 days 1,2 , 8, 9, 15 and 16, Cytoxan 300 mg/M2 and dexamethasone 40 mg by mouth weekly every 4 weeks. Status post 4 cycles.  Last dose was given 08/09/2013 discontinued today secondary to disease progression.  Current therapy:  1) Systemic chemotherapy with Carfilzomib 56 mg/M2 days 1,2 , 8, 9, 15 and 16, Pomalyst 3 mg by mouth daily for 21 days every 4 weeks and dexamethasone 40 mg by mouth weekly every 4 weeks. First cycle started on 10/05/2013. 2) Zometa 4 mg IV given every 3 months.    INTERVAL HISTORY: [name] 52 y.o. male returns to the clinic today for followup visit accompanied by his wife. He presents to proceed with day 8 of cycle 1 of his increased dose of Carfilzomib with, Pomalyst at 3 mg. the patient states that he ended up taking 2 tablets of Pomalyst at 4 mg until he received a 3 mg tablets. He takes the Pomalyst for a total of 21 days every 4 weeks in addition the dexamethasone 40 mg once weekly. He continues to complain of increased right rib cage pain which is worse with coughing. His current hydrocodone or oxycodone tablets are not dealing with the pain with any appreciable significance nor is applying Lidoderm patches. In general he states that he otherwise feels well and reports that he completed his second course of Levaquin as prescribed. He requests a refill for his dexamethasone tablets. He states that when he does take the dexamethasone this is the one time that his right rib cage pain feels better. He was recently seen by Dr. Marissa Calamity at [facility] for second opinion  regarding his disease progression. Dr. face recommended for the patient treatment with increasing the dose of Carfilzomib to 56 mg/M2, in addition to Pomalyst 3 mg by mouth daily for 21 days every 4 weeks as well as weekly Decadron.  The bone marrow biopsy and aspirate that was performed at [facility] showed Involved by plasma cell neoplasm (48% plasma cells by touch preparation differential, 70% plasma cells by CD56 immunohistochemical analysis of core, and monotypic kappa by in-situ hybridization. Hypercellular bone  marrow (>95%) with reduced trilineage hematopoiesis. The patient denied having any other significant complaints. He denied having any fever or chills. He has no nausea or vomiting.   MEDICAL HISTORY: Past Medical History  Diagnosis Date  . Multiple myeloma 09/24/2011  . Hypertension     ALLERGIES:  is allergic to red dye and other.  MEDICATIONS:  Current Outpatient Prescriptions  Medication Sig Dispense Refill  . amLODipine (NORVASC) 10 MG tablet Take 10 mg by mouth daily.      . benzonatate (TESSALON) 200 MG capsule Take 200 mg by mouth 3 (three) times daily as needed.       [name] dexamethasone (DECADRON) 4 MG tablet Take 10 tablets (40 mg total) by mouth once a week. Takes 40 mg Q Monday  40 tablet  2  . HYDROcodone-acetaminophen (NORCO) 10-325 MG per tablet Take 1-2 tablets by mouth every 6 (six) hours as needed. For pain.  40 tablet  0  . HYDROcodone-homatropine (HYCODAN) 5-1.5 MG/5ML syrup Take 5 mLs by mouth every 6 (six) hours as needed for cough.  240 mL  0  . lidocaine (LIDODERM) 5 % Place 1 patch onto the skin daily. Remove & Discard patch within 12 hours or as directed by MD  30 patch  0  . Nebivolol HCl (BYSTOLIC) 20 MG TABS Take 30 mg by mouth daily.      [name] omeprazole (PRILOSEC) 40 MG capsule Take 40 mg by mouth daily as needed (for heartburn).       [name] oxyCODONE-acetaminophen (PERCOCET/ROXICET) 5-325 MG per tablet Take 1 tablet by mouth every 6 (six) hours as needed for pain.  40 tablet  0  . pomalidomide (POMALYST) 3 MG capsule Take 1 capsule (3 mg total) by mouth daily. Take with water on days 1-21. Repeat every 28 days.  21 capsule  0  . potassium chloride SA (K-DUR,KLOR-CON) 20 MEQ tablet Take 1 tablet (20 mEq total) by mouth daily.  30 tablet  0  . pregabalin (LYRICA) 50 MG capsule Take 1 capsule (50 mg total) by mouth 3 (three) times daily.  270 capsule  1  . prochlorperazine (COMPAZINE) 10 MG tablet Take 1 tablet (10 mg total) by mouth every 6 (six) hours as needed.  60  tablet  0  . warfarin (COUMADIN) 5 MG tablet Take 5 mg by mouth daily except 7.5 mg on Mondays or as directed.  40 tablet  2  . levofloxacin (LEVAQUIN) 500 MG tablet Take 1 tablet (500 mg total) by mouth daily. 500mg  daily x 5 days  5 tablet  0  . morphine (MS CONTIN) 30 MG 12 hr tablet Take 1 tablet (30 mg total) by mouth every 12 (twelve) hours.  60 tablet  0   No current facility-administered medications for this visit.   Facility-Administered Medications Ordered in Other Visits  Medication Dose Route Frequency Provider Last Rate Last Dose  . sodium chloride 0.9 % injection 10 mL  10 mL Intracatheter PRN Si Gaul, MD   10  mL at 07/27/13 1617    SURGICAL HISTORY:  Past Surgical History  Procedure Laterality Date  . Video bronchoscopy  12/11/2012    Procedure: VIDEO BRONCHOSCOPY;  Surgeon: Kerin Perna, MD;  Location: [facility] OR;  Service: Thoracic;  Laterality: N/A;  . Video assisted thoracoscopy  12/11/2012    Procedure: VIDEO ASSISTED THORACOSCOPY;  Surgeon: Kerin Perna, MD;  Location: [facility] OR;  Service: Thoracic;  Laterality: Right;  . Decortication  12/11/2012    Procedure: DECORTICATION;  Surgeon: Kerin Perna, MD;  Location: [facility] OR;  Service: Thoracic;  Laterality: N/A;    REVIEW OF SYSTEMS:  Constitutional: positive for fatigue Eyes: negative Ears, nose, mouth, throat, and face: negative Respiratory: negative Cardiovascular: negative Gastrointestinal: negative Genitourinary:negative Integument/breast: negative Hematologic/lymphatic: negative Musculoskeletal:positive for bone pain and Right lateral rib cage pain Neurological: negative Behavioral/Psych: negative Endocrine: negative Allergic/Immunologic: negative   PHYSICAL EXAMINATION: General appearance: alert, cooperative, fatigued and no distress Head: Normocephalic, without obvious abnormality, atraumatic Neck: no adenopathy, no JVD, supple, symmetrical, trachea midline and thyroid not enlarged, symmetric, no  tenderness/mass/nodules Lymph nodes: Cervical, supraclavicular, and axillary nodes normal. Resp: clear to auscultation bilaterally Back: symmetric, no curvature. ROM normal. No CVA tenderness. Cardio: regular rate and rhythm, S1, S2 normal, no murmur, click, rub or gallop GI: soft, non-tender; bowel sounds normal; no masses,  no organomegaly Extremities: extremities normal, atraumatic, no cyanosis or edema Neurologic: Alert and oriented X 3, normal strength and tone. Normal symmetric reflexes. Normal coordination and gait  ECOG PERFORMANCE STATUS: 1 - Symptomatic but completely ambulatory  Blood pressure 142/65, pulse 74, temperature 98.4 F (36.9 C), temperature source Oral, resp. rate 18, height 5\' 3"  (1.6 m), weight 217 lb 4.8 oz (98.567 kg).  LABORATORY DATA: Lab Results  Component Value Date   WBC 2.8* 10/12/2013   HGB 8.1* 10/12/2013   HCT 24.7* 10/12/2013   MCV 92.2 10/12/2013   PLT 35* 10/12/2013      Chemistry      Component Value Date/Time   NA 135* 10/05/2013 0803   NA 136 03/16/2013 1930   K 3.3* 10/05/2013 0803   K 3.8 03/16/2013 1930   CL 112* 05/10/2013 0849   CL 110 03/16/2013 1930   CO2 19* 10/05/2013 0803   CO2 16* 03/16/2013 1930   BUN 7.7 10/05/2013 0803   BUN 22 03/16/2013 1930   CREATININE 0.9 10/05/2013 0803   CREATININE 1.24 03/16/2013 1930      Component Value Date/Time   CALCIUM 9.2 10/05/2013 0803   CALCIUM 8.2* 03/16/2013 1930   ALKPHOS 36* 10/05/2013 0803   ALKPHOS 31* 03/16/2013 1930   AST 18 10/05/2013 0803   AST 11 03/16/2013 1930   ALT <6 10/05/2013 0803   ALT 15 03/16/2013 1930   BILITOT 0.54 10/05/2013 0803   BILITOT 0.2* 03/16/2013 1930      Accession #: [id number] from [facility]. 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  Comment:  The plasma cells demonstrate significantly blastic morphology as well apparent  partial loss of CD138 expression by immunohistochemistry. Correlation with  clinical, serologic and radiologic features will  be of interest.    ASSESSMENT AND PLAN: This is a very pleasant 52 years old [place] American male with recurrent multiple myeloma most recently treated with 4 cycles with Carfilzomib, Cytoxan and Decadron but unfortunately his recent myeloma panel showed evidence for disease progression. He is now being treated with Carfilzomib 56 mg/M2 on days 1, 2, 8, 9, 15, 16 every 4 weeks in addition to Pomalyst 3 mg by mouth daily for 21 days every 4 weeks as well as Decadron 40 mg by mouth on a weekly basis based on the recommendation of Dr. Marissa Calamity. He is status post day 1 and 2 of cycle 1. He presents today to proceed with day 8 of cycle 2 however his ANC is low at 1.4 with a total white count of 2.8 however more telling as his platelet count which is 35,000 this is a significant drop from the 98,000 from last week. Patient was discussed with also seen by Dr. Arbutus Ped. We will hold treatment this week due to the thrombocytopenia. Bleeding precautions were discussed with the patient and his wife both voiced understanding. For pain management patient will be started on MS Contin at 30 mg by mouth every 12 hours he was given a prescription for 60 tablets with no refill. A refill for his dexamethasone was sent to his pharmacy of record via E. scribed. Patient will return in one week for another symptom management visit, reevaluation of pain management and repeat labs. If his lab values remain low we will refer him back to Dr. Marissa Calamity for reevaluation and possible inclusion  into a clinical trial for treatment of his disease progression.  Laural Benes, Keshia Weare E, PA-C   He was advised to call immediately if she has any concerning symptoms in the interval. The patient voices understanding of current disease status and treatment options and is in agreement with the current care plan.  All questions were answered. The patient knows to call the clinic with any problems, questions or concerns. We can certainly see the patient much sooner if necessary.  ADDENDUM: Hematology/Oncology Attending: I had a face to face encounter with the patient today. I recommended his care plan. This is a very pleasant 52 years old [place] American male with recurrent multiple myeloma. He is status post treatment with several regimen of chemotherapy and currently on high-dose Carfilzomib in addition to Pomalyst and Decadron. He tolerated the first week of this treatment fairly well but he has significant thrombocytopenia with platelets count of 35,000. We will hold the scheduled chemotherapy for this week. I have a concern that the patient may not tolerate the higher dose of Carfilzomib and may have to reduce his dose again to 36 mg/M2. The patient would come back for followup visit in one week for evaluation before resuming his systemic therapy. For pain management will start the patient on MS Contin 30 mg by mouth every 12 hours in addition to the Vicodin for breakthrough pain. He was advised to call immediately if he has any concerning symptoms in the interval.  Lajuana Matte., MD 10/12/2013

## 2013-10-12 NOTE — Patient Instructions (Signed)
INR below goal due to holding doses over the weekend No changes  Since platelets are low will not have you take any extra coumadin Continue 5 mg daily except for 7.5 mg on Mondays Recheck PT/INR with next scheduled lab/appointments next week on 10/19/13

## 2013-10-12 NOTE — Telephone Encounter (Signed)
gv pt appt schedule for December. °

## 2013-10-12 NOTE — Progress Notes (Signed)
INR below goal Pt seen by Dr. Arbutus Ped today Platelets low today at 35. No treatment today Pt did have some minor gum bleeding over the weekend (likely due to thrombocytopenia and INR > 2) Pt did not take coumadin on Saturday or Sunday and his gums improved No issues to report today No other changes or bleeding issues Pt did resume coumadin yesterday with his regularly scheduled 7.5 mg dose. Since platelets are low will not boost patient's INR dose and will let his INR trend up slowly Plan: Continue 5 mg daily except for 7.5 mg on Mondays Recheck PT/INR with next scheduled lab/appointments next week on 10/19/13

## 2013-10-13 ENCOUNTER — Ambulatory Visit: Payer: 59

## 2013-10-18 ENCOUNTER — Telehealth: Payer: Self-pay | Admitting: Medical Oncology

## 2013-10-18 ENCOUNTER — Other Ambulatory Visit: Payer: Self-pay | Admitting: Internal Medicine

## 2013-10-18 NOTE — Telephone Encounter (Signed)
[facility] received enrollment information for revlimid.

## 2013-10-19 ENCOUNTER — Other Ambulatory Visit: Payer: 59 | Admitting: Lab

## 2013-10-19 ENCOUNTER — Ambulatory Visit (HOSPITAL_BASED_OUTPATIENT_CLINIC_OR_DEPARTMENT_OTHER): Payer: 59 | Admitting: Physician Assistant

## 2013-10-19 ENCOUNTER — Ambulatory Visit (HOSPITAL_BASED_OUTPATIENT_CLINIC_OR_DEPARTMENT_OTHER): Payer: 59 | Admitting: Pharmacist

## 2013-10-19 ENCOUNTER — Telehealth: Payer: Self-pay | Admitting: *Deleted

## 2013-10-19 ENCOUNTER — Other Ambulatory Visit (HOSPITAL_BASED_OUTPATIENT_CLINIC_OR_DEPARTMENT_OTHER): Payer: 59 | Admitting: Lab

## 2013-10-19 ENCOUNTER — Ambulatory Visit: Payer: 59 | Admitting: Physician Assistant

## 2013-10-19 ENCOUNTER — Ambulatory Visit (HOSPITAL_BASED_OUTPATIENT_CLINIC_OR_DEPARTMENT_OTHER): Payer: 59

## 2013-10-19 ENCOUNTER — Telehealth: Payer: Self-pay | Admitting: Internal Medicine

## 2013-10-19 VITALS — BP 147/70 | HR 70 | Temp 97.0°F | Resp 20 | Ht 63.0 in | Wt 218.9 lb

## 2013-10-19 DIAGNOSIS — R52 Pain, unspecified: Secondary | ICD-10-CM

## 2013-10-19 DIAGNOSIS — Z5181 Encounter for therapeutic drug level monitoring: Secondary | ICD-10-CM

## 2013-10-19 DIAGNOSIS — D696 Thrombocytopenia, unspecified: Secondary | ICD-10-CM

## 2013-10-19 DIAGNOSIS — C9000 Multiple myeloma not having achieved remission: Secondary | ICD-10-CM

## 2013-10-19 DIAGNOSIS — Z5112 Encounter for antineoplastic immunotherapy: Secondary | ICD-10-CM

## 2013-10-19 DIAGNOSIS — I776 Arteritis, unspecified: Secondary | ICD-10-CM

## 2013-10-19 DIAGNOSIS — C9002 Multiple myeloma in relapse: Secondary | ICD-10-CM

## 2013-10-19 DIAGNOSIS — Z86718 Personal history of other venous thrombosis and embolism: Secondary | ICD-10-CM

## 2013-10-19 DIAGNOSIS — Z7901 Long term (current) use of anticoagulants: Secondary | ICD-10-CM

## 2013-10-19 DIAGNOSIS — D6481 Anemia due to antineoplastic chemotherapy: Secondary | ICD-10-CM

## 2013-10-19 LAB — CBC WITH DIFFERENTIAL/PLATELET
BASO%: 1.1 % (ref 0.0–2.0)
Basophils Absolute: 0 10*3/uL (ref 0.0–0.1)
EOS%: 2.3 % (ref 0.0–7.0)
Eosinophils Absolute: 0.1 10*3/uL (ref 0.0–0.5)
HCT: 24.4 % — ABNORMAL LOW (ref 38.4–49.9)
HGB: 8 g/dL — ABNORMAL LOW (ref 13.0–17.1)
LYMPH%: 23.6 % (ref 14.0–49.0)
MCH: 30.9 pg (ref 27.2–33.4)
MCV: 94.2 fL (ref 79.3–98.0)
MONO%: 15.7 % — ABNORMAL HIGH (ref 0.0–14.0)
NEUT#: 2 10*3/uL (ref 1.5–6.5)
NEUT%: 57.3 % (ref 39.0–75.0)
Platelets: 96 10*3/uL — ABNORMAL LOW (ref 140–400)
WBC: 3.5 10*3/uL — ABNORMAL LOW (ref 4.0–10.3)
lymph#: 0.8 10*3/uL — ABNORMAL LOW (ref 0.9–3.3)

## 2013-10-19 LAB — COMPREHENSIVE METABOLIC PANEL (CC13)
ALT: 8 U/L (ref 0–55)
AST: 9 U/L (ref 5–34)
Alkaline Phosphatase: 45 U/L (ref 40–150)
Anion Gap: 8 mEq/L (ref 3–11)
BUN: 28.4 mg/dL — ABNORMAL HIGH (ref 7.0–26.0)
CO2: 16 mEq/L — ABNORMAL LOW (ref 22–29)
Chloride: 110 mEq/L — ABNORMAL HIGH (ref 98–109)
Glucose: 114 mg/dl (ref 70–140)
Potassium: 4 mEq/L (ref 3.5–5.1)
Sodium: 134 mEq/L — ABNORMAL LOW (ref 136–145)

## 2013-10-19 LAB — POCT INR: INR: 1.2

## 2013-10-19 MED ORDER — ONDANSETRON 8 MG/50ML IVPB (CHCC)
8.0000 mg | Freq: Once | INTRAVENOUS | Status: AC
  Administered 2013-10-19: 8 mg via INTRAVENOUS

## 2013-10-19 MED ORDER — SODIUM CHLORIDE 0.9 % IV SOLN
Freq: Once | INTRAVENOUS | Status: AC
  Administered 2013-10-19: 16:00:00 via INTRAVENOUS

## 2013-10-19 MED ORDER — ONDANSETRON 8 MG/NS 50 ML IVPB
INTRAVENOUS | Status: AC
  Filled 2013-10-19: qty 8

## 2013-10-19 MED ORDER — HEPARIN SOD (PORK) LOCK FLUSH 100 UNIT/ML IV SOLN
500.0000 [IU] | Freq: Once | INTRAVENOUS | Status: AC | PRN
  Administered 2013-10-19: 500 [IU]
  Filled 2013-10-19: qty 5

## 2013-10-19 MED ORDER — SODIUM CHLORIDE 0.9 % IV SOLN
Freq: Once | INTRAVENOUS | Status: AC
  Administered 2013-10-19: 17:00:00 via INTRAVENOUS

## 2013-10-19 MED ORDER — DEXAMETHASONE SODIUM PHOSPHATE 10 MG/ML IJ SOLN
INTRAMUSCULAR | Status: AC
  Filled 2013-10-19: qty 1

## 2013-10-19 MED ORDER — SODIUM CHLORIDE 0.9 % IJ SOLN
10.0000 mL | INTRAMUSCULAR | Status: DC | PRN
  Administered 2013-10-19: 10 mL
  Filled 2013-10-19: qty 10

## 2013-10-19 MED ORDER — DEXTROSE 5 % IV SOLN
56.0000 mg/m2 | Freq: Once | INTRAVENOUS | Status: AC
  Administered 2013-10-19: 118 mg via INTRAVENOUS
  Filled 2013-10-19: qty 59

## 2013-10-19 MED ORDER — DEXAMETHASONE SODIUM PHOSPHATE 10 MG/ML IJ SOLN
10.0000 mg | Freq: Once | INTRAMUSCULAR | Status: AC
  Administered 2013-10-19: 10 mg via INTRAVENOUS

## 2013-10-19 NOTE — Telephone Encounter (Signed)
Per staff message and POF I have scheduled appts.  [name]  

## 2013-10-19 NOTE — Patient Instructions (Signed)
Will have patient take 7.5 mg today then continue 5 mg daily except for 7.5 mg on Mondays Recheck PT/INR with next scheduled lab/appointments next week on 10/19/13

## 2013-10-19 NOTE — Progress Notes (Signed)
Pt seen during infusion INR=1.2 Finished abx and missed a dose because his gums were bleeding. No other changes to report Take 7.5 mg today then continue 5mg  daily with 7.5 mg Mondays. PLTC today is 96  RTC on 11/02/13 8:30 for lab and 8:45 cc

## 2013-10-19 NOTE — Patient Instructions (Signed)
Carfilzomib injection What is this medicine? CARFILZOMIB (kar FILZ oh mib) is a chemotherapy drug that works by slowing or stopping cancer cell growth. This medicine is used to treat multiple myeloma. This medicine may be used for other purposes; ask your health care provider or pharmacist if you have questions. COMMON BRAND NAME(S): KYPROLIS What should I tell my health care provider before I take this medicine? They need to know if you have any of these conditions: -heart disease -irregular heartbeat -liver disease -lung or breathing disease -an unusual or allergic reaction to carfilzomib, or other medicines, foods, dyes, or preservatives -pregnant or trying to get pregnant -breast-feeding How should I use this medicine? This medicine is for injection or infusion into a vein. It is given by a health care professional in a hospital or clinic setting. Talk to your pediatrician regarding the use of this medicine in children. Special care may be needed. Overdosage: If you think you've taken too much of this medicine contact a poison control center or emergency room at once. Overdosage: If you think you have taken too much of this medicine contact a poison control center or emergency room at once. NOTE: This medicine is only for you. Do not share this medicine with others. What if I miss a dose? It is important not to miss your dose. Call your doctor or health care professional if you are unable to keep an appointment. What may interact with this medicine? Interactions are not expected. Give your health care provider a list of all the medicines, herbs, non-prescription drugs, or dietary supplements you use. Also tell them if you smoke, drink alcohol, or use illegal drugs. Some items may interact with your medicine. This list may not describe all possible interactions. Give your health care provider a list of all the medicines, herbs, non-prescription drugs, or dietary supplements you use. Also  tell them if you smoke, drink alcohol, or use illegal drugs. Some items may interact with your medicine. What should I watch for while using this medicine? Your condition will be monitored carefully while you are receiving this medicine. Report any side effects. Continue your course of treatment even though you feel ill unless your doctor tells you to stop. Call your doctor or health care professional for advice if you get a fever, chills or sore throat, or other symptoms of a cold or flu. Do not treat yourself. Try to avoid being around people who are sick. Do not become pregnant while taking this medicine. Women should inform their doctor if they wish to become pregnant or think they might be pregnant. There is a potential for serious side effects to an unborn child. Talk to your health care professional or pharmacist for more information. Do not breast-feed an infant while taking this medicine. Check with your doctor or health care professional if you get an attack of severe diarrhea, nausea and vomiting, or if you sweat a lot. The loss of too much body fluid can make it dangerous for you to take this medicine. You may get dizzy. Do not drive, use machinery, or do anything that needs mental alertness until you know how this medicine affects you. Do not stand or sit up quickly, especially if you are an older patient. This reduces the risk of dizzy or fainting spells. What side effects may I notice from receiving this medicine? Side effects that you should report to your doctor or health care professional as soon as possible: -allergic reactions like skin rash, itching or hives,   swelling of the face, lips, or tongue -breathing problems -chest pain or palpitationschest tightness -cough -dark urine -dizziness -feeling faint or lightheaded -fever or chills -general ill feeling or flu-like symptoms -light-colored stools -palpitations -right upper belly pain -swelling of the legs or ankles -unusual  bleeding or bruising -unusually weak or tired -yellowing of the eyes or skin  Side effects that usually do not require medical attention (Report these to your doctor or health care professional if they continue or are bothersome.): -diarrhea -headache -nausea, vomiting -tiredness This list may not describe all possible side effects. Call your doctor for medical advice about side effects. You may report side effects to FDA at 1-800-FDA-1088. Where should I keep my medicine? This drug is given in a hospital or clinic and will not be stored at home. NOTE: This sheet is a summary. It may not cover all possible information. If you have questions about this medicine, talk to your doctor, pharmacist, or health care provider.  2014, [employer]vier/Gold Standard. (2012-04-24 17:02:29)  

## 2013-10-19 NOTE — Telephone Encounter (Signed)
Gave pt appt for lab and MD for December 2014, ema Elijio Miles regarding PRBC

## 2013-10-20 ENCOUNTER — Ambulatory Visit (HOSPITAL_COMMUNITY)
Admission: RE | Admit: 2013-10-20 | Discharge: 2013-10-20 | Disposition: A | Discharge status: Home / Self Care | Payer: 59 | Source: Ambulatory Visit | Attending: Internal Medicine | Admitting: Internal Medicine

## 2013-10-20 ENCOUNTER — Ambulatory Visit (HOSPITAL_BASED_OUTPATIENT_CLINIC_OR_DEPARTMENT_OTHER): Payer: 59

## 2013-10-20 ENCOUNTER — Telehealth: Payer: Self-pay | Admitting: Internal Medicine

## 2013-10-20 ENCOUNTER — Other Ambulatory Visit: Payer: 59 | Admitting: Lab

## 2013-10-20 VITALS — BP 140/67 | HR 72 | Temp 98.6°F | Resp 20

## 2013-10-20 DIAGNOSIS — C9000 Multiple myeloma not having achieved remission: Secondary | ICD-10-CM | POA: Insufficient documentation

## 2013-10-20 DIAGNOSIS — D649 Anemia, unspecified: Secondary | ICD-10-CM | POA: Insufficient documentation

## 2013-10-20 DIAGNOSIS — C9002 Multiple myeloma in relapse: Secondary | ICD-10-CM

## 2013-10-20 DIAGNOSIS — Z5112 Encounter for antineoplastic immunotherapy: Secondary | ICD-10-CM

## 2013-10-20 LAB — HOLD TUBE, BLOOD BANK

## 2013-10-20 MED ORDER — HEPARIN SOD (PORK) LOCK FLUSH 100 UNIT/ML IV SOLN
500.0000 [IU] | Freq: Once | INTRAVENOUS | Status: AC | PRN
  Administered 2013-10-20: 500 [IU]
  Filled 2013-10-20: qty 5

## 2013-10-20 MED ORDER — DEXTROSE 5 % IV SOLN
56.0000 mg/m2 | Freq: Once | INTRAVENOUS | Status: AC
  Administered 2013-10-20: 118 mg via INTRAVENOUS
  Filled 2013-10-20: qty 59

## 2013-10-20 MED ORDER — ONDANSETRON 8 MG/NS 50 ML IVPB
INTRAVENOUS | Status: AC
  Filled 2013-10-20: qty 8

## 2013-10-20 MED ORDER — SODIUM CHLORIDE 0.9 % IV SOLN
Freq: Once | INTRAVENOUS | Status: AC
  Administered 2013-10-20: 09:00:00 via INTRAVENOUS

## 2013-10-20 MED ORDER — ONDANSETRON 8 MG/50ML IVPB (CHCC)
8.0000 mg | Freq: Once | INTRAVENOUS | Status: AC
  Administered 2013-10-20: 8 mg via INTRAVENOUS

## 2013-10-20 MED ORDER — DEXAMETHASONE SODIUM PHOSPHATE 10 MG/ML IJ SOLN
INTRAMUSCULAR | Status: AC
  Filled 2013-10-20: qty 1

## 2013-10-20 MED ORDER — SODIUM CHLORIDE 0.9 % IJ SOLN
10.0000 mL | INTRAMUSCULAR | Status: DC | PRN
  Administered 2013-10-20: 10 mL
  Filled 2013-10-20: qty 10

## 2013-10-20 MED ORDER — DEXAMETHASONE SODIUM PHOSPHATE 10 MG/ML IJ SOLN
10.0000 mg | Freq: Once | INTRAMUSCULAR | Status: AC
  Administered 2013-10-20: 10 mg via INTRAVENOUS

## 2013-10-20 NOTE — Progress Notes (Signed)
Dr. Arbutus Ped came to examine patient today since he lost strength in legs in route to infusion room and landed on his knees. No injury.

## 2013-10-20 NOTE — Patient Instructions (Signed)
[facility] Discharge Instructions for Patients Receiving Chemotherapy  Start MiraLax 17grams daily for constipation!  Do not remove your blue band.  Today you received the following chemotherapy agent: Kyprolis  To help prevent nausea and vomiting after your treatment, we encourage you to take your nausea medication:compazine 10 mg every 6 hours as needed   If you develop nausea and vomiting that is not controlled by your nausea medication, call the clinic.   BELOW ARE SYMPTOMS THAT SHOULD BE REPORTED IMMEDIATELY:  *FEVER GREATER THAN 100.5 F  *CHILLS WITH OR WITHOUT FEVER  NAUSEA AND VOMITING THAT IS NOT CONTROLLED WITH YOUR NAUSEA MEDICATION  *UNUSUAL SHORTNESS OF BREATH  *UNUSUAL BRUISING OR BLEEDING  TENDERNESS IN MOUTH AND THROAT WITH OR WITHOUT PRESENCE OF ULCERS  *URINARY PROBLEMS  *BOWEL PROBLEMS  UNUSUAL RASH Items with * indicate a potential emergency and should be followed up as soon as possible.  Feel free to call the clinic you have any questions or concerns. The clinic phone number is [phone].  It has been a pleasure to serve you today!

## 2013-10-20 NOTE — Telephone Encounter (Signed)
Talked to pt and gave her appt for lab,md and PRBC for december 2015

## 2013-10-21 ENCOUNTER — Encounter: Payer: Self-pay | Admitting: Physician Assistant

## 2013-10-21 NOTE — Progress Notes (Addendum)
[facility] [phone]   [phone]  SHARED VISIT PROGRESS NOTE  [name], MD [address] [place] [address]  Principle Diagnosis:  #1 recurrent multiple myeloma IgG kappa subtype diagnosed in June of 2008  #2 history of vasculitis and thrombosis of skin lesions   Prior Therapy:  #1 status post palliative radiotherapy to the left hip under the care of Dr. Roselind Messier  #2 status post 5 cycles of systemic chemotherapy with Revlimid and low dose Decadron. Last dose given June 2009 with good response.  #3 status post autologous peripheral blood stem cell transplant at [facility] on 02/02/2008.  #4 the patient had evidence for disease recurrence in December 2010.  #5 Revlimid 25 mg by mouth daily for 21 days every 4 weeks in addition to Decadron 40 mg orally on a weekly basis. The patient is status post 28 cycles, discontinued today secondary to disease progression.  #6 Systemic chemotherapy with Velcade 1.3 mg/M2 on days 1, 4, 8 and 11 in addition to Doxil 30 mg/M2 on day 4 and Decadron 40 mg by mouth on weekly basis every 3 weeks. Status post 3 cycles, last dose was given 05/04/2012 discontinued secondary to intolerance.  #7 Salvage therapy treatment with the Pomalyst 4 mg by mouth daily for 21 days every 28 days as well as Cytoxan 50 mg by mouth every other day and dexamethasone 40 mg by mouth once weekly. Therapy beginning 09/25/2012, status post 2 cycles discontinued secondary to disease progression and intolerance.  #8 Kyprolis (Carfilzomib) 27 mg/M2 on days 1, 2, 8, 9, 15 and 16 every 4 weeks. First dose on 02/08/2013. This would be concurrent with the dexamethasone 40 mg by mouth on a weekly basis. Status post 3 cycles with disease progression.  #9 Systemic chemotherapy with Carfilzomib 27 mg/M2 days 1,2 , 8, 9, 15 and 16, Cytoxan 300 mg/M2 and dexamethasone 40 mg by mouth weekly every 4 weeks. Status post 4 cycles.  Last dose was given 08/09/2013 discontinued today secondary to disease progression.  Current therapy:  1) Systemic chemotherapy with Carfilzomib 56 mg/M2 days 1,2 , 8, 9, 15 and 16, Pomalyst 3 mg by mouth daily for 21 days every 4 weeks and dexamethasone 40 mg by mouth weekly every 4 weeks. First cycle started on 10/05/2013. 2) Zometa 4 mg IV given every 3 months.    INTERVAL HISTORY: [name] 52 y.o. male returns to the clinic today for followup visit accompanied by his wife. He presents to proceed with day 15 of cycle 1 of his increased dose of Carfilzomib with, Pomalyst at 3 mg.  He takes the Pomalyst for a total of 21 days every 4 weeks in addition the dexamethasone 40 mg once weekly. Day 8 and 9 this cycle were held due to significant thrombocytopenia with a platelet count of 35,000 after days 1 and 2 of this cycle. He continues to complain of pain affecting the right side and back which do tend to be worse with coughing. His current pain medications and even Lidoderm patches are not adequately addressing his pain relief. We added MS Contin 30 mg by mouth every 12 hours last week and this has provided significant improvement in pain control. He continues to have peripheral neuropathy symptoms affecting his feet.  He was recently seen by Dr. Marissa Calamity at [facility] for second opinion regarding his disease progression. Dr.Voorhees recommended for the patient treatment with increasing the dose  of Carfilzomib to 56 mg/M2, in addition to Pomalyst 3 mg by mouth daily for 21 days every 4 weeks as well as weekly Decadron.  The bone marrow biopsy and aspirate that was performed at [facility] showed Involved by plasma cell neoplasm (48% plasma cells by touch preparation differential, 70% plasma cells by CD56 immunohistochemical analysis of core, and monotypic kappa by in-situ hybridization. Hypercellular bone marrow (>95%) with reduced trilineage hematopoiesis. The patient denied having any other  significant complaints. He denied having any fever or chills. He has no nausea or vomiting.   MEDICAL HISTORY: Past Medical History  Diagnosis Date  . Multiple myeloma 09/24/2011  . Hypertension     ALLERGIES:  is allergic to red dye and other.  MEDICATIONS:  Current Outpatient Prescriptions  Medication Sig Dispense Refill  . amLODipine (NORVASC) 10 MG tablet Take 10 mg by mouth daily.      . benzonatate (TESSALON) 200 MG capsule Take 200 mg by mouth 3 (three) times daily as needed.       [name] dexamethasone (DECADRON) 4 MG tablet Take 10 tablets (40 mg total) by mouth once a week. Takes 40 mg Q Monday  40 tablet  2  . HYDROcodone-acetaminophen (NORCO) 10-325 MG per tablet Take 1-2 tablets by mouth every 6 (six) hours as needed. For pain.  40 tablet  0  . lidocaine (LIDODERM) 5 % Place 1 patch onto the skin daily. Remove & Discard patch within 12 hours or as directed by MD  30 patch  0  . morphine (MS CONTIN) 30 MG 12 hr tablet Take 1 tablet (30 mg total) by mouth every 12 (twelve) hours.  60 tablet  0  . Nebivolol HCl (BYSTOLIC) 20 MG TABS Take 30 mg by mouth daily.      [name] omeprazole (PRILOSEC) 40 MG capsule Take 40 mg by mouth daily as needed (for heartburn).       [name] oxyCODONE-acetaminophen (PERCOCET/ROXICET) 5-325 MG per tablet Take 1 tablet by mouth every 6 (six) hours as needed for pain.  40 tablet  0  . pomalidomide (POMALYST) 3 MG capsule Take 1 capsule (3 mg total) by mouth daily. Take with water on days 1-21. Repeat every 28 days.  21 capsule  0  . potassium chloride SA (K-DUR,KLOR-CON) 20 MEQ tablet TAKE 1 TABLET BY MOUTH EVERY DAY  30 tablet  0  . pregabalin (LYRICA) 50 MG capsule Take 1 capsule (50 mg total) by mouth 3 (three) times daily.  270 capsule  1  . prochlorperazine (COMPAZINE) 10 MG tablet Take 1 tablet (10 mg total) by mouth every 6 (six) hours as needed.  60 tablet  0  . warfarin (COUMADIN) 5 MG tablet Take 5 mg by mouth daily except 7.5 mg on Mondays or as directed.   40 tablet  2  . HYDROcodone-homatropine (HYCODAN) 5-1.5 MG/5ML syrup Take 5 mLs by mouth every 6 (six) hours as needed for cough.  240 mL  0   No current facility-administered medications for this visit.   Facility-Administered Medications Ordered in Other Visits  Medication Dose Route Frequency Provider Last Rate Last Dose  . sodium chloride 0.9 % injection 10 mL  10 mL Intracatheter PRN Si Gaul, MD   10 mL at 07/27/13 1617    SURGICAL HISTORY:  Past Surgical History  Procedure Laterality Date  . Video bronchoscopy  12/11/2012    Procedure: VIDEO BRONCHOSCOPY;  Surgeon: Kerin Perna, MD;  Location: [facility] OR;  Service: Thoracic;  Laterality: N/A;  . Video assisted thoracoscopy  12/11/2012    Procedure: VIDEO ASSISTED THORACOSCOPY;  Surgeon: Kerin Perna, MD;  Location: [facility] OR;  Service: Thoracic;  Laterality: Right;  . Decortication  12/11/2012    Procedure: DECORTICATION;  Surgeon: Kerin Perna, MD;  Location: [facility] OR;  Service: Thoracic;  Laterality: N/A;    REVIEW OF SYSTEMS:  Constitutional: positive for fatigue Eyes: negative Ears, nose, mouth, throat, and face: negative Respiratory: negative Cardiovascular: negative Gastrointestinal: negative Genitourinary:negative Integument/breast: negative Hematologic/lymphatic: negative Musculoskeletal:positive for bone pain and Right lateral rib cage pain Neurological: negative Behavioral/Psych: negative Endocrine: negative Allergic/Immunologic: negative   PHYSICAL EXAMINATION: General appearance: alert, cooperative, fatigued and no distress Head: Normocephalic, without obvious abnormality, atraumatic Neck: no adenopathy, no JVD, supple, symmetrical, trachea midline and thyroid not enlarged, symmetric, no tenderness/mass/nodules Lymph nodes: Cervical, supraclavicular, and axillary nodes normal. Resp: clear to auscultation bilaterally Back: symmetric, no curvature. ROM normal. No CVA tenderness. Cardio: regular rate and  rhythm, S1, S2 normal, no murmur, click, rub or gallop GI: soft, non-tender; bowel sounds normal; no masses,  no organomegaly Extremities: extremities normal, atraumatic, no cyanosis or edema Neurologic: Alert and oriented X 3, normal strength and tone. Normal symmetric reflexes. Normal coordination and gait  ECOG PERFORMANCE STATUS: 1 - Symptomatic but completely ambulatory  Blood pressure 147/70, pulse 70, temperature 97 F (36.1 C), temperature source Oral, resp. rate 20, height 5\' 3"  (1.6 m), weight 218 lb 14.4 oz (99.292 kg).  LABORATORY DATA: Lab Results  Component Value Date   WBC 3.5* 10/19/2013   HGB 8.0* 10/19/2013   HCT 24.4* 10/19/2013   MCV 94.2 10/19/2013   PLT 96* 10/19/2013      Chemistry      Component Value Date/Time   NA 134* 10/19/2013 1359   NA 136 03/16/2013 1930   K 4.0 10/19/2013 1359   K 3.8 03/16/2013 1930   CL 112* 05/10/2013 0849   CL 110 03/16/2013 1930   CO2 16* 10/19/2013 1359   CO2 16* 03/16/2013 1930   BUN 28.4* 10/19/2013 1359   BUN 22 03/16/2013 1930   CREATININE 0.9 10/19/2013 1359   CREATININE 1.24 03/16/2013 1930      Component Value Date/Time   CALCIUM 8.8 10/19/2013 1359   CALCIUM 8.2* 03/16/2013 1930   ALKPHOS 45 10/19/2013 1359   ALKPHOS 31* 03/16/2013 1930   AST 9 10/19/2013 1359   AST 11 03/16/2013 1930   ALT 8 10/19/2013 1359   ALT 15 03/16/2013 1930   BILITOT 0.47 10/19/2013 1359   BILITOT 0.2* 03/16/2013 1930      Accession #: [id number] from [facility]. 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  Comment:   The plasma cells demonstrate significantly blastic morphology as well apparent  partial loss of CD138 expression by immunohistochemistry. Correlation with  clinical, serologic and radiologic features will be of interest.    ASSESSMENT AND PLAN: This is a very pleasant 52 years old [place] American male with recurrent multiple myeloma most recently treated with 4 cycles with Carfilzomib, Cytoxan and Decadron but unfortunately his recent myeloma panel showed evidence  for disease progression. He is now being treated with Carfilzomib 56 mg/M2 on days 1, 2, 8, 9, 15, 16 every 4 weeks in addition to Pomalyst 3 mg by mouth daily for 21 days every 4 weeks as well as Decadron 40 mg by mouth on a weekly basis based on the recommendation of Dr. Marissa Calamity. He is status post day 1 and 2 of cycle 1. Patient was discussed with also seen by Dr. Arbutus Ped. His platelet count has recovered to 96,000 and we will proceed with day #15 and 16 at this cycle with the car feels the mid at 56 mg per meter squared.we will monitor him closely and have a followup one week for repeat laboratory evaluation. Should his platelet count significantly drop again as well as his white count we will have to reconsider whether she can tolerate his increased dose. His starting hemoglobin is 8.0 we will arrange to transfuse him 2 units packed red blood cells on 10/22/2013 to address this level of anemia. We will hold treatment this week due to the thrombocytopenia. Bleeding precautions were discussed with the patient and his wife both voiced understanding. For pain management patient will continue  on MS Contin at 30 mg by mouth every 12 hours.  Patient will return in one week for another symptom management visit, reevaluation of pain management and repeat labs. If his lab values remain low we will refer him back to Dr. Marissa Calamity for reevaluation and possible inclusion into a clinical trial for treatment of his disease progression.  Laural Benes, Kell Ferris E,  PA-C   He was advised to call immediately if she has any concerning symptoms in the interval. The patient voices understanding of current disease status and treatment options and is in agreement with the current care plan.  All questions were answered. The patient knows to call the clinic with any problems, questions or concerns. We can certainly see the patient much sooner if necessary.  ADDENDUM:  Hematology/Oncology Attending:  I had a face to face encounter with the patient. I recommended his current plan. This is a very pleasant 52 years old African American male with recurrent multiple myeloma was started recently on high-dose Carfilzomib 56 mg/M2 in addition to Pomalyst 3 mg by mouth daily for 21 days every 4 weeks and Decadron 40 mg by mouth on a weekly basis. He has significant thrombocytopenia after the first week of his treatment. He missed his treatment last week because of the thrombocytopenia. His platelets counts are better today. I recommended for the patient to proceed with day 15 and 16 of his first cycle of the treatment as scheduled this week. For the chemotherapy-induced anemia, I will arrange for the patient to receive 2 units of PRBCs transfusion. We will continue to monitor his CBC closely. He would come back for follow up visit in one week for evaluation. The patient was advised to call immediately if he has any concerning symptoms in the interval. Lajuana Matte., MD. 10/23/2013

## 2013-10-21 NOTE — Patient Instructions (Addendum)
Return on 10/22/2013 for transfusion of 2 units of packed red blood cells Followup in one week

## 2013-10-22 ENCOUNTER — Ambulatory Visit (HOSPITAL_BASED_OUTPATIENT_CLINIC_OR_DEPARTMENT_OTHER): Payer: 59

## 2013-10-22 ENCOUNTER — Other Ambulatory Visit: Payer: Self-pay | Admitting: Medical Oncology

## 2013-10-22 VITALS — BP 143/76 | HR 52 | Temp 98.3°F | Resp 20

## 2013-10-22 DIAGNOSIS — D6481 Anemia due to antineoplastic chemotherapy: Secondary | ICD-10-CM

## 2013-10-22 DIAGNOSIS — C9000 Multiple myeloma not having achieved remission: Secondary | ICD-10-CM

## 2013-10-22 LAB — PREPARE RBC (CROSSMATCH)

## 2013-10-22 MED ORDER — DIPHENHYDRAMINE HCL 25 MG PO TABS
25.0000 mg | ORAL_TABLET | ORAL | Status: AC
  Administered 2013-10-22: 25 mg via ORAL
  Filled 2013-10-22: qty 1

## 2013-10-22 MED ORDER — SODIUM CHLORIDE 0.9 % IV SOLN
Freq: Once | INTRAVENOUS | Status: AC
  Administered 2013-10-22: 12:00:00 via INTRAVENOUS

## 2013-10-22 MED ORDER — SODIUM CHLORIDE 0.9 % IJ SOLN
10.0000 mL | INTRAMUSCULAR | Status: DC | PRN
  Administered 2013-10-22: 10 mL via INTRAVENOUS
  Filled 2013-10-22: qty 10

## 2013-10-22 MED ORDER — ACETAMINOPHEN 325 MG PO TABS
650.0000 mg | ORAL_TABLET | ORAL | Status: AC
  Administered 2013-10-22: 650 mg via ORAL

## 2013-10-22 MED ORDER — ACETAMINOPHEN 325 MG PO TABS
ORAL_TABLET | ORAL | Status: AC
  Filled 2013-10-22: qty 2

## 2013-10-22 MED ORDER — HEPARIN SOD (PORK) LOCK FLUSH 100 UNIT/ML IV SOLN
500.0000 [IU] | Freq: Once | INTRAVENOUS | Status: AC
  Administered 2013-10-22: 500 [IU] via INTRAVENOUS
  Filled 2013-10-22: qty 5

## 2013-10-22 MED ORDER — DIPHENHYDRAMINE HCL 25 MG PO CAPS
ORAL_CAPSULE | ORAL | Status: AC
  Filled 2013-10-22: qty 1

## 2013-10-22 NOTE — Patient Instructions (Signed)
Blood Transfusion Information WHAT IS A BLOOD TRANSFUSION? A transfusion is the replacement of blood or some of its parts. Blood is made up of multiple cells which provide different functions.  Red blood cells carry oxygen and are used for blood loss replacement.  White blood cells fight against infection.  Platelets control bleeding.  Plasma helps clot blood.  Other blood products are available for specialized needs, such as hemophilia or other clotting disorders. BEFORE THE TRANSFUSION  Who gives blood for transfusions?   You may be able to donate blood to be used at a later date on yourself (autologous donation).  Relatives can be asked to donate blood. This is generally not any safer than if you have received blood from a stranger. The same precautions are taken to ensure safety when a relative's blood is donated.  Healthy volunteers who are fully evaluated to make sure their blood is safe. This is blood bank blood. Transfusion therapy is the safest it has ever been in the practice of medicine. Before blood is taken from a donor, a complete history is taken to make sure that person has no history of diseases nor engages in risky social behavior (examples are intravenous drug use or sexual activity with multiple partners). The donor's travel history is screened to minimize risk of transmitting infections, such as malaria. The donated blood is tested for signs of infectious diseases, such as HIV and hepatitis. The blood is then tested to be sure it is compatible with you in order to minimize the chance of a transfusion reaction. If you or a relative donates blood, this is often done in anticipation of surgery and is not appropriate for emergency situations. It takes many days to process the donated blood. RISKS AND COMPLICATIONS Although transfusion therapy is very safe and saves many lives, the main dangers of transfusion include:   Getting an infectious disease.  Developing a  transfusion reaction. This is an allergic reaction to something in the blood you were given. Every precaution is taken to prevent this. The decision to have a blood transfusion has been considered carefully by your caregiver before blood is given. Blood is not given unless the benefits outweigh the risks. AFTER THE TRANSFUSION  Right after receiving a blood transfusion, you will usually feel much better and more energetic. This is especially true if your red blood cells have gotten low (anemic). The transfusion raises the level of the red blood cells which carry oxygen, and this usually causes an energy increase.  The nurse administering the transfusion will monitor you carefully for complications. HOME CARE INSTRUCTIONS  No special instructions are needed after a transfusion. You may find your energy is better. Speak with your caregiver about any limitations on activity for underlying diseases you may have. SEEK MEDICAL CARE IF:   Your condition is not improving after your transfusion.  You develop redness or irritation at the intravenous (IV) site. SEEK IMMEDIATE MEDICAL CARE IF:  Any of the following symptoms occur over the next 12 hours:  Shaking chills.  You have a temperature by mouth above 102 F (38.9 C), not controlled by medicine.  Chest, back, or muscle pain.  People around you feel you are not acting correctly or are confused.  Shortness of breath or difficulty breathing.  Dizziness and fainting.  You get a rash or develop hives.  You have a decrease in urine output.  Your urine turns a dark color or changes to pink, red, or brown. Any of the following   symptoms occur over the next 10 days:  You have a temperature by mouth above 102 F (38.9 C), not controlled by medicine.  Shortness of breath.  Weakness after normal activity.  The white part of the eye turns yellow (jaundice).  You have a decrease in the amount of urine or are urinating less often.  Your  urine turns a dark color or changes to pink, red, or brown. Document Released: 11/01/2000 Document Revised: 01/27/2012 Document Reviewed: 06/20/2008 Exit[employer] Patient Information 2014 [employer].  

## 2013-10-23 LAB — TYPE AND SCREEN
ABO/RH(D): O POS
Antibody Screen: NEGATIVE
Donor AG Type: NEGATIVE
Unit division: 0

## 2013-10-25 ENCOUNTER — Ambulatory Visit: Payer: 59 | Admitting: Physician Assistant

## 2013-10-25 ENCOUNTER — Other Ambulatory Visit (HOSPITAL_BASED_OUTPATIENT_CLINIC_OR_DEPARTMENT_OTHER): Payer: 59

## 2013-10-25 ENCOUNTER — Telehealth: Payer: Self-pay | Admitting: Internal Medicine

## 2013-10-25 ENCOUNTER — Ambulatory Visit (HOSPITAL_BASED_OUTPATIENT_CLINIC_OR_DEPARTMENT_OTHER): Payer: 59

## 2013-10-25 ENCOUNTER — Telehealth: Payer: Self-pay | Admitting: *Deleted

## 2013-10-25 ENCOUNTER — Other Ambulatory Visit: Payer: Self-pay | Admitting: Medical Oncology

## 2013-10-25 ENCOUNTER — Ambulatory Visit (HOSPITAL_BASED_OUTPATIENT_CLINIC_OR_DEPARTMENT_OTHER): Payer: 59 | Admitting: Physician Assistant

## 2013-10-25 ENCOUNTER — Encounter: Payer: Self-pay | Admitting: Physician Assistant

## 2013-10-25 VITALS — BP 145/67 | HR 65 | Temp 97.5°F | Resp 18 | Ht 63.0 in | Wt 220.7 lb

## 2013-10-25 DIAGNOSIS — D701 Agranulocytosis secondary to cancer chemotherapy: Secondary | ICD-10-CM

## 2013-10-25 DIAGNOSIS — C9000 Multiple myeloma not having achieved remission: Secondary | ICD-10-CM

## 2013-10-25 DIAGNOSIS — Z5189 Encounter for other specified aftercare: Secondary | ICD-10-CM

## 2013-10-25 DIAGNOSIS — C9002 Multiple myeloma in relapse: Secondary | ICD-10-CM

## 2013-10-25 DIAGNOSIS — D696 Thrombocytopenia, unspecified: Secondary | ICD-10-CM

## 2013-10-25 LAB — COMPREHENSIVE METABOLIC PANEL (CC13)
ALT: 73 U/L — ABNORMAL HIGH (ref 0–55)
Alkaline Phosphatase: 92 U/L (ref 40–150)
Anion Gap: 8 mEq/L (ref 3–11)
CO2: 21 mEq/L — ABNORMAL LOW (ref 22–29)
Calcium: 8.6 mg/dL (ref 8.4–10.4)
Chloride: 110 mEq/L — ABNORMAL HIGH (ref 98–109)
Sodium: 140 mEq/L (ref 136–145)
Total Bilirubin: 0.75 mg/dL (ref 0.20–1.20)
Total Protein: 7.2 g/dL (ref 6.4–8.3)

## 2013-10-25 LAB — CBC WITH DIFFERENTIAL/PLATELET
Eosinophils Absolute: 0.2 10*3/uL (ref 0.0–0.5)
HCT: 29.1 % — ABNORMAL LOW (ref 38.4–49.9)
HGB: 10 g/dL — ABNORMAL LOW (ref 13.0–17.1)
MCHC: 34.4 g/dL (ref 32.0–36.0)
MCV: 85.8 fL (ref 79.3–98.0)
MONO#: 0.4 10*3/uL (ref 0.1–0.9)
MONO%: 25.3 % — ABNORMAL HIGH (ref 0.0–14.0)
Platelets: 48 10*3/uL — ABNORMAL LOW (ref 140–400)
RBC: 3.39 10*6/uL — ABNORMAL LOW (ref 4.20–5.82)
RDW: 19.2 % — ABNORMAL HIGH (ref 11.0–14.6)
WBC: 1.6 10*3/uL — ABNORMAL LOW (ref 4.0–10.3)
nRBC: 5 % — ABNORMAL HIGH (ref 0–0)

## 2013-10-25 MED ORDER — FILGRASTIM 480 MCG/0.8ML IJ SOLN
480.0000 ug | Freq: Once | INTRAMUSCULAR | Status: AC
  Administered 2013-10-25: 480 ug via SUBCUTANEOUS
  Filled 2013-10-25: qty 0.8

## 2013-10-25 NOTE — Progress Notes (Addendum)
[facility] Telephone:([phone]) [phone]   [phone]) [phone]  SHARED VISIT PROGRESS NOTE  [name], MD [address] [place] [address]  Principle Diagnosis:  #1 recurrent multiple myeloma IgG kappa subtype diagnosed in June of 2008  #2 history of vasculitis and thrombosis of skin lesions   Prior Therapy:  #1 status post palliative radiotherapy to the left hip under the care of Dr. Roselind Messier  #2 status post 5 cycles of systemic chemotherapy with Revlimid and low dose Decadron. Last dose given June 2009 with good response.  #3 status post autologous peripheral blood stem cell transplant at [facility] on 02/02/2008.  #4 the patient had evidence for disease recurrence in December 2010.  #5 Revlimid 25 mg by mouth daily for 21 days every 4 weeks in addition to Decadron 40 mg orally on a weekly basis. The patient is status post 28 cycles, discontinued today secondary to disease progression.  #6 Systemic chemotherapy with Velcade 1.3 mg/M2 on days 1, 4, 8 and 11 in addition to Doxil 30 mg/M2 on day 4 and Decadron 40 mg by mouth on weekly basis every 3 weeks. Status post 3 cycles, last dose was given 05/04/2012 discontinued secondary to intolerance.  #7 Salvage therapy treatment with the Pomalyst 4 mg by mouth daily for 21 days every 28 days as well as Cytoxan 50 mg by mouth every other day and dexamethasone 40 mg by mouth once weekly. Therapy beginning 09/25/2012, status post 2 cycles discontinued secondary to disease progression and intolerance.  #8 Kyprolis (Carfilzomib) 27 mg/M2 on days 1, 2, 8, 9, 15 and 16 every 4 weeks. First dose on 02/08/2013. This would be concurrent with the dexamethasone 40 mg by mouth on a weekly basis. Status post 3 cycles with disease progression.  #9 Systemic chemotherapy with Carfilzomib 27 mg/M2 days 1,2 , 8, 9, 15 and 16, Cytoxan 300 mg/M2 and dexamethasone 40 mg by mouth weekly every 4 weeks. Status post 4 cycles.  Last dose was given 08/09/2013 discontinued today secondary to disease progression.  Current therapy:  1) Systemic chemotherapy with Carfilzomib 56 mg/M2 days 1,2 , 8, 9, 15 and 16, Pomalyst 3 mg by mouth daily for 21 days every 4 weeks and dexamethasone 40 mg by mouth weekly every 4 weeks. First cycle started on 10/05/2013. 2) Zometa 4 mg IV given every 3 months.    INTERVAL HISTORY: [name] 52 y.o. male returns to the clinic today for followup visit accompanied by his wife. He presents for symptom management visit after receiving day 15 and 16 of cycle 1 is increased dose of Carfilzomib with, Pomalyst at 3 mg.  He takes the Pomalyst for a total of 21 days every 4 weeks in addition the dexamethasone 40 mg once weekly. Day 8 and 9 this cycle were held due to significant thrombocytopenia with a platelet count of 35,000 after days 1 and 2 of this cycle. He continues to complain of pain affecting the right side and back.he reports that he stopped taking the MS Contin for pain relief after taking 2 doses because it made him "too sleepy". It did help with his pain management. He denied any fever or, chills or further cough. The patient denied having any other significant complaints. He denied having any fever or chills. He has no nausea or vomiting.  He was evaluated by Dr. Marissa Calamity at [facility] for second opinion regarding his disease progression. Dr.Voorhees recommended for the  patient treatment with increasing the dose of Carfilzomib to 56 mg/M2, in addition to Pomalyst 3 mg by mouth daily for 21 days every 4 weeks as well as weekly Decadron.  The bone marrow biopsy and aspirate that was performed at [facility] showed Involved by plasma cell neoplasm (48% plasma cells by touch preparation differential, 70% plasma cells by CD56 immunohistochemical analysis of core, and monotypic kappa by in-situ hybridization. Hypercellular bone marrow (>95%) with reduced trilineage  hematopoiesis.  MEDICAL HISTORY: Past Medical History  Diagnosis Date  . Multiple myeloma 09/24/2011  . Hypertension     ALLERGIES:  is allergic to red dye and other.  MEDICATIONS:  Current Outpatient Prescriptions  Medication Sig Dispense Refill  . amLODipine (NORVASC) 10 MG tablet Take 10 mg by mouth daily.      . benzonatate (TESSALON) 200 MG capsule Take 200 mg by mouth 3 (three) times daily as needed.       [name] dexamethasone (DECADRON) 4 MG tablet Take 10 tablets (40 mg total) by mouth once a week. Takes 40 mg Q Monday  40 tablet  2  . HYDROcodone-acetaminophen (NORCO) 10-325 MG per tablet Take 1-2 tablets by mouth every 6 (six) hours as needed. For pain.  40 tablet  0  . lidocaine (LIDODERM) 5 % Place 1 patch onto the skin daily. Remove & Discard patch within 12 hours or as directed by MD  30 patch  0  . morphine (MS CONTIN) 30 MG 12 hr tablet Take 1 tablet (30 mg total) by mouth every 12 (twelve) hours.  60 tablet  0  . Nebivolol HCl (BYSTOLIC) 20 MG TABS Take 30 mg by mouth daily.      [name] omeprazole (PRILOSEC) 40 MG capsule Take 40 mg by mouth daily as needed (for heartburn).       [name] oxyCODONE-acetaminophen (PERCOCET/ROXICET) 5-325 MG per tablet Take 1 tablet by mouth every 6 (six) hours as needed for pain.  40 tablet  0  . pomalidomide (POMALYST) 3 MG capsule Take 1 capsule (3 mg total) by mouth daily. Take with water on days 1-21. Repeat every 28 days.  21 capsule  0  . potassium chloride SA (K-DUR,KLOR-CON) 20 MEQ tablet TAKE 1 TABLET BY MOUTH EVERY DAY  30 tablet  0  . pregabalin (LYRICA) 50 MG capsule Take 1 capsule (50 mg total) by mouth 3 (three) times daily.  270 capsule  1  . prochlorperazine (COMPAZINE) 10 MG tablet Take 1 tablet (10 mg total) by mouth every 6 (six) hours as needed.  60 tablet  0  . warfarin (COUMADIN) 5 MG tablet Take 5 mg by mouth daily except 7.5 mg on Mondays or as directed.  40 tablet  2  . HYDROcodone-homatropine (HYCODAN) 5-1.5 MG/5ML syrup Take 5 mLs  by mouth every 6 (six) hours as needed for cough.  240 mL  0   No current facility-administered medications for this visit.   Facility-Administered Medications Ordered in Other Visits  Medication Dose Route Frequency Provider Last Rate Last Dose  . sodium chloride 0.9 % injection 10 mL  10 mL Intracatheter PRN Si Gaul, MD   10 mL at 07/27/13 1617    SURGICAL HISTORY:  Past Surgical History  Procedure Laterality Date  . Video bronchoscopy  12/11/2012    Procedure: VIDEO BRONCHOSCOPY;  Surgeon: Kerin Perna, MD;  Location: [facility] OR;  Service: Thoracic;  Laterality: N/A;  . Video assisted thoracoscopy  12/11/2012    Procedure: VIDEO ASSISTED THORACOSCOPY;  Surgeon: Kerin Perna, MD;  Location: [facility] OR;  Service: Thoracic;  Laterality: Right;  . Decortication  12/11/2012    Procedure: DECORTICATION;  Surgeon: Kerin Perna, MD;  Location: [facility] OR;  Service: Thoracic;  Laterality: N/A;    REVIEW OF SYSTEMS:  Constitutional: positive for fatigue Eyes: negative Ears, nose, mouth, throat, and face: negative Respiratory: negative Cardiovascular: negative Gastrointestinal: negative Genitourinary:negative Integument/breast: negative Hematologic/lymphatic: negative Musculoskeletal:positive for bone pain and Right lateral rib cage pain Neurological: negative Behavioral/Psych: negative Endocrine: negative Allergic/Immunologic: negative   PHYSICAL EXAMINATION: General appearance: alert, cooperative, fatigued and no distress Head: Normocephalic, without obvious abnormality, atraumatic Neck: no adenopathy, no JVD, supple, symmetrical, trachea midline and thyroid not enlarged, symmetric, no tenderness/mass/nodules Lymph nodes: Cervical, supraclavicular, and axillary nodes normal. Resp: clear to auscultation bilaterally Back: symmetric, no curvature. ROM normal. No CVA tenderness. Cardio: regular rate and rhythm, S1, S2 normal, no murmur, click, rub or gallop GI: soft, non-tender; bowel  sounds normal; no masses,  no organomegaly Extremities: extremities normal, atraumatic, no cyanosis or edema Neurologic: Alert and oriented X 3, normal strength and tone. Normal symmetric reflexes. Normal coordination and gait  ECOG PERFORMANCE STATUS: 1 - Symptomatic but completely ambulatory  Blood pressure 145/67, pulse 65, temperature 97.5 F (36.4 C), temperature source Oral, resp. rate 18, height 5\' 3"  (1.6 m), weight 220 lb 11.2 oz (100.109 kg), SpO2 96.00%.  LABORATORY DATA: Lab Results  Component Value Date   WBC 1.6* 10/25/2013   HGB 10.0* 10/25/2013   HCT 29.1* 10/25/2013   MCV 85.8 10/25/2013   PLT 48* 10/25/2013      Chemistry      Component Value Date/Time   NA 140 10/25/2013 1202   NA 136 03/16/2013 1930   K 3.5 10/25/2013 1202   K 3.8 03/16/2013 1930   CL 112* 05/10/2013 0849   CL 110 03/16/2013 1930   CO2 21* 10/25/2013 1202   CO2 16* 03/16/2013 1930   BUN 13.5 10/25/2013 1202   BUN 22 03/16/2013 1930   CREATININE 0.7 10/25/2013 1202   CREATININE 1.24 03/16/2013 1930      Component Value Date/Time   CALCIUM 8.6 10/25/2013 1202   CALCIUM 8.2* 03/16/2013 1930   ALKPHOS 92 10/25/2013 1202   ALKPHOS 31* 03/16/2013 1930   AST 50* 10/25/2013 1202   AST 11 03/16/2013 1930   ALT 73* 10/25/2013 1202   ALT 15 03/16/2013 1930   BILITOT 0.75 10/25/2013 1202   BILITOT 0.2* 03/16/2013 1930      Accession #: [id number] from [facility]. 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  Comment:  The plasma cells demonstrate significantly blastic morphology  as well apparent  partial loss of CD138 expression by immunohistochemistry. Correlation with  clinical, serologic and radiologic features will be of interest.    ASSESSMENT AND PLAN: This is a very pleasant 52 years old [place] American male with recurrent multiple myeloma most recently treated with 4 cycles with Carfilzomib, Cytoxan and Decadron but unfortunately his recent myeloma panel showed evidence for disease progression. He is now being treated with Carfilzomib 56 mg/M2 on days 1,  2, 8, 9, 15, 16 every 4 weeks in addition to Pomalyst 3 mg by mouth daily for 21 days every 4 weeks as well as Decadron 40 mg by mouth on a weekly basis based on the recommendation of Dr. Marissa Calamity. He is status post day 1 and 2 of cycle 1. Patient was discussed with and also seen by Dr. Arbutus Ped. His platelet count  is again significantly reduced however this time it is 48,000 with no evidence of bleeding or bruising. His ANC however is decreased to 0.5. Patient will be treated with Neupogen at 480 mcg subcutaneously for 3 days for neutrophil support. He'll followup in one week prior to the start of cycle #2 of increased dose Carfilzomib at 56 mg/M2, in addition to Pomalyst 3 mg by mouth daily for 21 days every 4 weeks as well as weekly Decadron.   For pain management the patient  was advised to decrease his MS Contin to each bedtime and continue his short acting pain medication through out the day to see if this decreases the somnolence but still give him adequate pain relief.  Patient will return in one week for another symptom management visit, reevaluation of pain management and repeat labs. If his lab values remain low we will refer him back to Dr. Marissa Calamity for reevaluation and possible inclusion into a clinical trial for treatment of his disease progression.  Laural Benes, Shalimar Mcclain E, PA-C   He was advised to call immediately if she has any concerning symptoms in the interval. The patient voices understanding of current  disease status and treatment options and is in agreement with the current care plan.  All questions were answered. The patient knows to call the clinic with any problems, questions or concerns. We can certainly see the patient much sooner if necessary.  ADDENDUM: Hematology/Oncology Attending: I had a face to face encounter with the patient. I recommended his care plan. The patient is a very pleasant 52 years old African American male with recurrent multiple myeloma currently on treatment with Carfilzomib 56 mg/M2, Pomalyst 3 mg by mouth daily for 21 days every 3 weeks in addition to Decadron 40 mg on a weekly basis. He missed day 8 and 9 of the first cycle secondary to thrombocytopenia. His platelets count as well as absolute neutrophil count are down. I will start  The patient on Neupogen 480 mcg subcutaneously for the next 3 days. He would come back for follow up visit in one week for evaluation before starting cycle #2. He was advised to call immediately if he has any concerning symptoms in the interval. Lajuana Matte., MD 10/26/2013

## 2013-10-25 NOTE — Telephone Encounter (Signed)
Per staff message and POF I have scheduled appts.  [name]  

## 2013-10-25 NOTE — Telephone Encounter (Signed)
Gave pt appt for labs, injections and chemo, gave ML appt order to [facility] for 12/15,pt aware of all appts

## 2013-10-26 ENCOUNTER — Encounter: Payer: Self-pay | Admitting: Internal Medicine

## 2013-10-26 ENCOUNTER — Ambulatory Visit (HOSPITAL_BASED_OUTPATIENT_CLINIC_OR_DEPARTMENT_OTHER): Payer: 59

## 2013-10-26 ENCOUNTER — Other Ambulatory Visit: Payer: 59

## 2013-10-26 VITALS — BP 163/69 | HR 70 | Temp 99.1°F

## 2013-10-26 DIAGNOSIS — Z5189 Encounter for other specified aftercare: Secondary | ICD-10-CM

## 2013-10-26 DIAGNOSIS — C9002 Multiple myeloma in relapse: Secondary | ICD-10-CM

## 2013-10-26 DIAGNOSIS — D701 Agranulocytosis secondary to cancer chemotherapy: Secondary | ICD-10-CM

## 2013-10-26 MED ORDER — FILGRASTIM 480 MCG/0.8ML IJ SOLN
480.0000 ug | Freq: Once | INTRAMUSCULAR | Status: AC
  Administered 2013-10-26: 480 ug via SUBCUTANEOUS
  Filled 2013-10-26: qty 0.8

## 2013-10-26 NOTE — Patient Instructions (Signed)
Decrease her MS Contin to 1 tablet at bedtime Your neutrophil count is low and you will require Neupogen injections for the next 3 days Followup in one week

## 2013-10-26 NOTE — Patient Instructions (Signed)
Filgrastim, G-CSF injection What is this medicine? FILGRASTIM, G-CSF (fil GRA stim) stimulates the formation of white blood cells. This medicine is given to patients with conditions that may cause a decrease in white blood cells, like those receiving certain types of chemotherapy or bone marrow transplant. It helps the bone marrow recover its ability to produce white blood cells. Increasing the amount of white blood cells helps to decrease the risk of infection and fever. This medicine may be used for other purposes; ask your health care provider or pharmacist if you have questions. COMMON BRAND NAME(S): Neupogen What should I tell my health care provider before I take this medicine? They need to know if you have any of these conditions: -currently receiving radiation therapy -sickle cell disease -an unusual or allergic reaction to filgrastim, E. coli protein, other medicines, foods, dyes, or preservatives -pregnant or trying to get pregnant -breast-feeding How should I use this medicine? This medicine is for injection into a vein or injection under the skin. It is usually given by a health care professional in a hospital or clinic setting. If you get this medicine at home, you will be taught how to prepare and give this medicine. Always change the site for the injection under the skin. Let the solution warm to room temperature before you use it. Do not shake the solution before you withdraw a dose. Throw away any unused portion. Use exactly as directed. Take your medicine at regular intervals. Do not take your medicine more often than directed. It is important that you put your used needles and syringes in a special sharps container. Do not put them in a trash can. If you do not have a sharps container, call your pharmacist or healthcare provider to get one. Talk to your pediatrician regarding the use of this medicine in children. While this medicine may be prescribed for children for selected  conditions, precautions do apply. Overdosage: If you think you have taken too much of this medicine contact a poison control center or emergency room at once. NOTE: This medicine is only for you. Do not share this medicine with others. What if I miss a dose? Try not to miss doses. If you miss a dose take the dose as soon as you remember. If it is almost time for the next dose, do not take double doses unless told to by your doctor or health care professional. What may interact with this medicine? -lithium -medicines for cancer chemotherapy This list may not describe all possible interactions. Give your health care provider a list of all the medicines, herbs, non-prescription drugs, or dietary supplements you use. Also tell them if you smoke, drink alcohol, or use illegal drugs. Some items may interact with your medicine. What should I watch for while using this medicine? Visit your doctor or health care professional for regular checks on your progress. If you get a fever or any sign of infection while you are using this medicine, do not treat yourself. Check with your doctor or health care professional. Bone pain can usually be relieved by mild pain relievers such as acetaminophen or ibuprofen. Check with your doctor or health care professional before taking these medicines as they may hide a fever. Call your doctor or health care professional if the aches and pains are severe or do not go away. What side effects may I notice from receiving this medicine? Side effects that you should report to your doctor or health care professional as soon as possible: -allergic reactions   like skin rash, itching or hives, swelling of the face, lips, or tongue -difficulty breathing, wheezing -fever -pain, redness, or swelling at the injection site -stomach or side pain, or pain at the shoulder Side effects that usually do not require medical attention (report to your doctor or health care professional if they  continue or are bothersome): -bone pain (ribs, lower back, breast bone) -headache -skin rash This list may not describe all possible side effects. Call your doctor for medical advice about side effects. You may report side effects to FDA at 1-800-FDA-1088. Where should I keep my medicine? Keep out of the reach of children. Store in a refrigerator between 2 and 8 degrees C (36 and 46 degrees F). Do not freeze or leave in direct sunlight. If vials or syringes are left out of the refrigerator for more than 24 hours, they must be thrown away. Throw away unused vials after the expiration date on the carton. NOTE: This sheet is a summary. It may not cover all possible information. If you have questions about this medicine, talk to your doctor, pharmacist, or health care provider.  2014, Elsevier/Gold Standard. (2008-01-20 13:33:21)  

## 2013-10-26 NOTE — Progress Notes (Signed)
Put fmla form on nurse's desk °

## 2013-10-27 ENCOUNTER — Ambulatory Visit (HOSPITAL_BASED_OUTPATIENT_CLINIC_OR_DEPARTMENT_OTHER): Payer: 59

## 2013-10-27 ENCOUNTER — Telehealth: Payer: Self-pay | Admitting: Internal Medicine

## 2013-10-27 ENCOUNTER — Telehealth: Payer: Self-pay | Admitting: Medical Oncology

## 2013-10-27 VITALS — BP 148/59 | HR 67 | Temp 99.1°F

## 2013-10-27 DIAGNOSIS — D701 Agranulocytosis secondary to cancer chemotherapy: Secondary | ICD-10-CM

## 2013-10-27 DIAGNOSIS — C9000 Multiple myeloma not having achieved remission: Secondary | ICD-10-CM

## 2013-10-27 DIAGNOSIS — C9002 Multiple myeloma in relapse: Secondary | ICD-10-CM

## 2013-10-27 DIAGNOSIS — Z5189 Encounter for other specified aftercare: Secondary | ICD-10-CM

## 2013-10-27 MED ORDER — FILGRASTIM 480 MCG/0.8ML IJ SOLN
480.0000 ug | Freq: Once | INTRAMUSCULAR | Status: AC
  Administered 2013-10-27: 480 ug via SUBCUTANEOUS
  Filled 2013-10-27: qty 0.8

## 2013-10-27 MED ORDER — MOXIFLOXACIN HCL 400 MG PO TABS: 400.0000 mg | ORAL_TABLET | Freq: Every day | ORAL | Status: DC

## 2013-10-27 NOTE — Telephone Encounter (Signed)
Gave pt appt for lab,md and chemo for 12/15 and beyond

## 2013-10-27 NOTE — Telephone Encounter (Signed)
Pt reports Upper resp congestion, temp 99.1 F x 2 days.

## 2013-10-27 NOTE — Telephone Encounter (Signed)
Per Dr Arbutus Ped I called in avelox to local pharmacy and pt notified. Coumadin clinic Pharmacist notified.

## 2013-10-29 ENCOUNTER — Telehealth: Payer: Self-pay | Admitting: Medical Oncology

## 2013-10-29 NOTE — Telephone Encounter (Signed)
Pomalyst refill request given to Dr Arbutus Ped.

## 2013-11-01 ENCOUNTER — Telehealth: Payer: Self-pay | Admitting: Internal Medicine

## 2013-11-01 ENCOUNTER — Ambulatory Visit: Payer: 59 | Admitting: Pharmacist

## 2013-11-01 ENCOUNTER — Other Ambulatory Visit (HOSPITAL_BASED_OUTPATIENT_CLINIC_OR_DEPARTMENT_OTHER): Payer: 59

## 2013-11-01 ENCOUNTER — Other Ambulatory Visit: Payer: Self-pay | Admitting: *Deleted

## 2013-11-01 ENCOUNTER — Encounter: Payer: Self-pay | Admitting: Internal Medicine

## 2013-11-01 ENCOUNTER — Ambulatory Visit (HOSPITAL_BASED_OUTPATIENT_CLINIC_OR_DEPARTMENT_OTHER): Payer: 59 | Admitting: Internal Medicine

## 2013-11-01 ENCOUNTER — Ambulatory Visit (HOSPITAL_BASED_OUTPATIENT_CLINIC_OR_DEPARTMENT_OTHER): Payer: 59

## 2013-11-01 VITALS — BP 136/68 | HR 67 | Temp 98.7°F | Resp 18 | Ht 63.0 in | Wt 220.3 lb

## 2013-11-01 DIAGNOSIS — I776 Arteritis, unspecified: Secondary | ICD-10-CM

## 2013-11-01 DIAGNOSIS — C9002 Multiple myeloma in relapse: Secondary | ICD-10-CM

## 2013-11-01 DIAGNOSIS — Z5112 Encounter for antineoplastic immunotherapy: Secondary | ICD-10-CM

## 2013-11-01 DIAGNOSIS — I10 Essential (primary) hypertension: Secondary | ICD-10-CM

## 2013-11-01 DIAGNOSIS — C9000 Multiple myeloma not having achieved remission: Secondary | ICD-10-CM

## 2013-11-01 LAB — COMPREHENSIVE METABOLIC PANEL (CC13)
ALT: 11 U/L (ref 0–55)
AST: 9 U/L (ref 5–34)
Albumin: 2.6 g/dL — ABNORMAL LOW (ref 3.5–5.0)
Alkaline Phosphatase: 51 U/L (ref 40–150)
BUN: 7.1 mg/dL (ref 7.0–26.0)
Chloride: 113 mEq/L — ABNORMAL HIGH (ref 98–109)
Creatinine: 0.7 mg/dL (ref 0.7–1.3)
Sodium: 141 mEq/L (ref 136–145)
Total Bilirubin: 0.58 mg/dL (ref 0.20–1.20)

## 2013-11-01 LAB — CBC WITH DIFFERENTIAL/PLATELET
BASO%: 3.1 % — ABNORMAL HIGH (ref 0.0–2.0)
EOS%: 3.1 % (ref 0.0–7.0)
HCT: 27.5 % — ABNORMAL LOW (ref 38.4–49.9)
LYMPH%: 29.4 % (ref 14.0–49.0)
MCH: 29.6 pg (ref 27.2–33.4)
MCHC: 32.7 g/dL (ref 32.0–36.0)
MONO#: 0.4 10*3/uL (ref 0.1–0.9)
MONO%: 13.5 % (ref 0.0–14.0)
NEUT%: 50.9 % (ref 39.0–75.0)
Platelets: 188 10*3/uL (ref 140–400)
RBC: 3.04 10*6/uL — ABNORMAL LOW (ref 4.20–5.82)
lymph#: 1 10*3/uL (ref 0.9–3.3)
nRBC: 1 % — ABNORMAL HIGH (ref 0–0)

## 2013-11-01 MED ORDER — SODIUM CHLORIDE 0.9 % IV SOLN
Freq: Once | INTRAVENOUS | Status: AC
  Administered 2013-11-01: 10:00:00 via INTRAVENOUS

## 2013-11-01 MED ORDER — SODIUM CHLORIDE 0.9 % IJ SOLN
10.0000 mL | INTRAMUSCULAR | Status: DC | PRN
  Administered 2013-11-01: 10 mL
  Filled 2013-11-01: qty 10

## 2013-11-01 MED ORDER — DEXAMETHASONE SODIUM PHOSPHATE 10 MG/ML IJ SOLN
10.0000 mg | Freq: Once | INTRAMUSCULAR | Status: AC
  Administered 2013-11-01: 10 mg via INTRAVENOUS

## 2013-11-01 MED ORDER — HEPARIN SOD (PORK) LOCK FLUSH 100 UNIT/ML IV SOLN
500.0000 [IU] | Freq: Once | INTRAVENOUS | Status: AC | PRN
  Administered 2013-11-01: 500 [IU]
  Filled 2013-11-01: qty 5

## 2013-11-01 MED ORDER — ONDANSETRON 8 MG/NS 50 ML IVPB
INTRAVENOUS | Status: AC
  Filled 2013-11-01: qty 8

## 2013-11-01 MED ORDER — DEXTROSE 5 % IV SOLN
56.0000 mg/m2 | Freq: Once | INTRAVENOUS | Status: AC
  Administered 2013-11-01: 118 mg via INTRAVENOUS
  Filled 2013-11-01: qty 59

## 2013-11-01 MED ORDER — DEXAMETHASONE SODIUM PHOSPHATE 10 MG/ML IJ SOLN
INTRAMUSCULAR | Status: AC
  Filled 2013-11-01: qty 1

## 2013-11-01 MED ORDER — ONDANSETRON 8 MG/50ML IVPB (CHCC)
8.0000 mg | Freq: Once | INTRAVENOUS | Status: AC
  Administered 2013-11-01: 8 mg via INTRAVENOUS

## 2013-11-01 NOTE — Patient Instructions (Signed)
Continue chemotherapy with Carfilzomib, Pomalyst and Decadron as scheduled. Followup visit in 2 weeks for reevaluation.

## 2013-11-01 NOTE — Progress Notes (Signed)
INR right below goal today at 1.8 Pt seen in infusion area Pt is doing well with no complaints No issues with bleeding/bruising No missed or extra doses Pt states his appetite and energy level have improved Pt currently on Day 5/7 of avelox antibiotic for possible URTI. This potentially may increase the INR. Pt has 3 days left of antibiotic. Will not make any changes Plan for coumadin management: Continue 5 mg daily except for 7.5 mg on Mondays Recheck PT/INR with next scheduled lab/appointments next week on 11/08/13 lab at 8:15am infusion at 8:15, CC at 8:30am

## 2013-11-01 NOTE — Telephone Encounter (Signed)
gv adn printed appt sched and avs for pt for DEC....added pt on Dr MM sched AJ only had afternoon appts and pt wants early mornintg

## 2013-11-01 NOTE — Progress Notes (Signed)
[facility] [phone]   [phone]  SHARED VISIT PROGRESS NOTE  [name], MD [address] [place] [address]  Principle Diagnosis:  #1 recurrent multiple myeloma IgG kappa subtype diagnosed in June of 2008  #2 history of vasculitis and thrombosis of skin lesions   Prior Therapy:  #1 status post palliative radiotherapy to the left hip under the care of Dr. Roselind Messier  #2 status post 5 cycles of systemic chemotherapy with Revlimid and low dose Decadron. Last dose given June 2009 with good response.  #3 status post autologous peripheral blood stem cell transplant at [facility] on 02/02/2008.  #4 the patient had evidence for disease recurrence in December 2010.  #5 Revlimid 25 mg by mouth daily for 21 days every 4 weeks in addition to Decadron 40 mg orally on a weekly basis. The patient is status post 28 cycles, discontinued today secondary to disease progression.  #6 Systemic chemotherapy with Velcade 1.3 mg/M2 on days 1, 4, 8 and 11 in addition to Doxil 30 mg/M2 on day 4 and Decadron 40 mg by mouth on weekly basis every 3 weeks. Status post 3 cycles, last dose was given 05/04/2012 discontinued secondary to intolerance.  #7 Salvage therapy treatment with the Pomalyst 4 mg by mouth daily for 21 days every 28 days as well as Cytoxan 50 mg by mouth every other day and dexamethasone 40 mg by mouth once weekly. Therapy beginning 09/25/2012, status post 2 cycles discontinued secondary to disease progression and intolerance.  #8 Kyprolis (Carfilzomib) 27 mg/M2 on days 1, 2, 8, 9, 15 and 16 every 4 weeks. First dose on 02/08/2013. This would be concurrent with the dexamethasone 40 mg by mouth on a weekly basis. Status post 3 cycles with disease progression.  #9 Systemic chemotherapy with Carfilzomib 27 mg/M2 days 1,2 , 8, 9, 15 and 16, Cytoxan 300 mg/M2 and dexamethasone 40 mg by mouth weekly every 4 weeks. Status post 4 cycles.  Last dose was given 08/09/2013 discontinued today secondary to disease progression.  Current therapy:  1) Systemic chemotherapy with Carfilzomib 56 mg/M2 days 1,2 , 8, 9, 15 and 16, Pomalyst 3 mg by mouth daily for 21 days every 4 weeks and dexamethasone 40 mg by mouth weekly every 4 weeks. First cycle started on 10/05/2013. Status post 1 cycle. The patient missed day 8 and 9 of the first cycle secondary to thrombocytopenia.  2) Zometa 4 mg IV given every 3 months.    INTERVAL HISTORY: [name] 52 y.o. male returns to the clinic today for followup visit accompanied by his wife. He is feeling much better after receiving 2 units of PRBCs transfusion last week. The patient was very active over the weekend. He denied having any significant complaints today. He has no significant fatigue or weakness. He has no chest pain, shortness of breath, cough or hemoptysis. The patient denied having any nausea or vomiting. He has no weight loss or night sweats. He is here today to start cycle #2 of chemotherapy.  MEDICAL HISTORY: Past Medical History  Diagnosis Date  . Multiple myeloma 09/24/2011  . Hypertension     ALLERGIES:  is allergic to red dye and other.  MEDICATIONS:  Current Outpatient Prescriptions  Medication Sig Dispense Refill  . amLODipine (NORVASC) 10 MG tablet Take 10 mg by mouth daily.      . benzonatate (TESSALON) 200 MG capsule Take 200 mg by mouth 3 (three) times  daily as needed.       [name] dexamethasone (DECADRON) 4 MG tablet Take 10 tablets (40 mg total) by mouth once a week. Takes 40 mg Q Monday  40 tablet  2  . HYDROcodone-acetaminophen (NORCO) 10-325 MG per tablet Take 1-2 tablets by mouth every 6 (six) hours as needed. For pain.  40 tablet  0  . HYDROcodone-homatropine (HYCODAN) 5-1.5 MG/5ML syrup Take 5 mLs by mouth every 6 (six) hours as needed for cough.  240 mL  0  . lidocaine (LIDODERM) 5 % Place 1 patch onto the skin daily. Remove & Discard patch within 12 hours or as  directed by MD  30 patch  0  . morphine (MS CONTIN) 30 MG 12 hr tablet Take 1 tablet (30 mg total) by mouth every 12 (twelve) hours.  60 tablet  0  . moxifloxacin (AVELOX) 400 MG tablet Take 1 tablet (400 mg total) by mouth daily at 8 pm.  7 tablet  0  . Nebivolol HCl (BYSTOLIC) 20 MG TABS Take 30 mg by mouth daily.      [name] omeprazole (PRILOSEC) 40 MG capsule Take 40 mg by mouth daily as needed (for heartburn).       [name] oxyCODONE-acetaminophen (PERCOCET/ROXICET) 5-325 MG per tablet Take 1 tablet by mouth every 6 (six) hours as needed for pain.  40 tablet  0  . pomalidomide (POMALYST) 3 MG capsule Take 1 capsule (3 mg total) by mouth daily. Take with water on days 1-21. Repeat every 28 days.  21 capsule  0  . potassium chloride SA (K-DUR,KLOR-CON) 20 MEQ tablet TAKE 1 TABLET BY MOUTH EVERY DAY  30 tablet  0  . pregabalin (LYRICA) 50 MG capsule Take 1 capsule (50 mg total) by mouth 3 (three) times daily.  270 capsule  1  . prochlorperazine (COMPAZINE) 10 MG tablet Take 1 tablet (10 mg total) by mouth every 6 (six) hours as needed.  60 tablet  0  . warfarin (COUMADIN) 5 MG tablet Take 5 mg by mouth daily except 7.5 mg on Mondays or as directed.  40 tablet  2   No current facility-administered medications for this visit.   Facility-Administered Medications Ordered in Other Visits  Medication Dose Route Frequency Provider Last Rate Last Dose  . sodium chloride 0.9 % injection 10 mL  10 mL Intracatheter PRN Si Gaul, MD   10 mL at 07/27/13 1617    SURGICAL HISTORY:  Past Surgical History  Procedure Laterality Date  . Video bronchoscopy  12/11/2012    Procedure: VIDEO BRONCHOSCOPY;  Surgeon: Kerin Perna, MD;  Location: [facility] OR;  Service: Thoracic;  Laterality: N/A;  . Video assisted thoracoscopy  12/11/2012    Procedure: VIDEO ASSISTED THORACOSCOPY;  Surgeon: Kerin Perna, MD;  Location: [facility] OR;  Service: Thoracic;  Laterality: Right;  . Decortication  12/11/2012    Procedure:  DECORTICATION;  Surgeon: Kerin Perna, MD;  Location: [facility] OR;  Service: Thoracic;  Laterality: N/A;    REVIEW OF SYSTEMS:  Constitutional: negative Eyes: negative Ears, nose, mouth, throat, and face: negative Respiratory: negative Cardiovascular: negative Gastrointestinal: negative Genitourinary:negative Integument/breast: negative Hematologic/lymphatic: negative Musculoskeletal:positive for bone pain and Right lateral rib cage pain Neurological: negative Behavioral/Psych: negative Endocrine: negative Allergic/Immunologic: negative   PHYSICAL EXAMINATION: General appearance: alert, cooperative, fatigued and no distress Head: Normocephalic, without obvious abnormality, atraumatic Neck: no adenopathy, no JVD, supple, symmetrical, trachea midline and thyroid not enlarged, symmetric, no tenderness/mass/nodules Lymph nodes: Cervical, supraclavicular, and axillary nodes  normal. Resp: clear to auscultation bilaterally Back: symmetric, no curvature. ROM normal. No CVA tenderness. Cardio: regular rate and rhythm, S1, S2 normal, no murmur, click, rub or gallop GI: soft, non-tender; bowel sounds normal; no masses,  no organomegaly Extremities: extremities normal, atraumatic, no cyanosis or edema Neurologic: Alert and oriented X 3, normal strength and tone. Normal symmetric reflexes. Normal coordination and gait  ECOG PERFORMANCE STATUS: 1 - Symptomatic but completely ambulatory  Blood pressure 136/68, pulse 67, temperature 98.7 F (37.1 C), temperature source Oral, resp. rate 18, height 5\' 3"  (1.6 m), weight 220 lb 4.8 oz (99.927 kg), SpO2 100.00%.  LABORATORY DATA: Lab Results  Component Value Date   WBC 3.3* 11/01/2013   HGB 9.0* 11/01/2013   HCT 27.5* 11/01/2013   MCV 90.5 11/01/2013   PLT 188 11/01/2013      Chemistry      Component Value Date/Time   NA 140 10/25/2013 1202   NA 136 03/16/2013 1930   K 3.5 10/25/2013 1202   K 3.8 03/16/2013 1930   CL 112* 05/10/2013 0849   CL  110 03/16/2013 1930   CO2 21* 10/25/2013 1202   CO2 16* 03/16/2013 1930   BUN 13.5 10/25/2013 1202   BUN 22 03/16/2013 1930   CREATININE 0.7 10/25/2013 1202   CREATININE 1.24 03/16/2013 1930      Component Value Date/Time   CALCIUM 8.6 10/25/2013 1202   CALCIUM 8.2* 03/16/2013 1930   ALKPHOS 92 10/25/2013 1202   ALKPHOS 31* 03/16/2013 1930   AST 50* 10/25/2013 1202   AST 11 03/16/2013 1930   ALT 73* 10/25/2013 1202   ALT 15 03/16/2013 1930   BILITOT 0.75 10/25/2013 1202   BILITOT 0.2* 03/16/2013 1930       ASSESSMENT AND PLAN: The patient is a very pleasant 52 years old African American male with recurrent multiple myeloma currently on treatment with Carfilzomib 56 mg/M2, Pomalyst 3 mg by mouth daily for 21 days every 3 weeks in addition to Decadron 40 mg on a weekly basis. He missed day 8 and 9 of the first cycle secondary to thrombocytopenia.  The patient is feeling much better today and his platelets count improved. I recommended for him to proceed with cycle #2 today as scheduled. He would come back for follow up visit in 2 weeks for evaluation and management any adverse effect of his treatment. He was advised to call immediately if he has any concerning symptoms in the interval.  He was advised to call immediately if she has any concerning symptoms in the interval. The patient voices understanding of current disease status and treatment options and is in agreement with the current care plan.  All questions were answered. The patient knows to call the clinic with any problems, questions or concerns. We can certainly see the patient much sooner if necessary.  Lajuana Matte., MD 11/01/2013

## 2013-11-01 NOTE — Patient Instructions (Signed)
INR right below goal No changes Continue 5 mg daily except for 7.5 mg on Mondays Recheck PT/INR with next scheduled lab/appointments next week on 11/08/13

## 2013-11-01 NOTE — Patient Instructions (Signed)
[facility] Discharge Instructions for Patients Receiving Chemotherapy  Today you received the following chemotherapy agents Kyprolis To help prevent nausea and vomiting after your treatment, we encourage you to take your nausea medication as prescribed.  If you develop nausea and vomiting that is not controlled by your nausea medication, call the clinic.   BELOW ARE SYMPTOMS THAT SHOULD BE REPORTED IMMEDIATELY:  *FEVER GREATER THAN 100.5 F  *CHILLS WITH OR WITHOUT FEVER  NAUSEA AND VOMITING THAT IS NOT CONTROLLED WITH YOUR NAUSEA MEDICATION  *UNUSUAL SHORTNESS OF BREATH  *UNUSUAL BRUISING OR BLEEDING  TENDERNESS IN MOUTH AND THROAT WITH OR WITHOUT PRESENCE OF ULCERS  *URINARY PROBLEMS  *BOWEL PROBLEMS  UNUSUAL RASH Items with * indicate a potential emergency and should be followed up as soon as possible.  Feel free to call the clinic you have any questions or concerns. The clinic phone number is [phone].    

## 2013-11-02 ENCOUNTER — Ambulatory Visit: Payer: 59

## 2013-11-02 ENCOUNTER — Other Ambulatory Visit: Payer: 59 | Admitting: Lab

## 2013-11-02 ENCOUNTER — Ambulatory Visit: Payer: 59 | Admitting: Physician Assistant

## 2013-11-02 ENCOUNTER — Ambulatory Visit (HOSPITAL_BASED_OUTPATIENT_CLINIC_OR_DEPARTMENT_OTHER): Payer: 59

## 2013-11-02 ENCOUNTER — Other Ambulatory Visit: Payer: Self-pay

## 2013-11-02 DIAGNOSIS — Z5112 Encounter for antineoplastic immunotherapy: Secondary | ICD-10-CM

## 2013-11-02 DIAGNOSIS — C9000 Multiple myeloma not having achieved remission: Secondary | ICD-10-CM

## 2013-11-02 MED ORDER — DEXAMETHASONE SODIUM PHOSPHATE 10 MG/ML IJ SOLN
INTRAMUSCULAR | Status: AC
  Filled 2013-11-02: qty 1

## 2013-11-02 MED ORDER — ONDANSETRON 8 MG/NS 50 ML IVPB
INTRAVENOUS | Status: AC
  Filled 2013-11-02: qty 8

## 2013-11-02 MED ORDER — POMALIDOMIDE 3 MG PO CAPS: 3.0000 mg | ORAL_CAPSULE | Freq: Every day | ORAL | Status: DC

## 2013-11-02 MED ORDER — ONDANSETRON 8 MG/50ML IVPB (CHCC)
8.0000 mg | Freq: Once | INTRAVENOUS | Status: AC
  Administered 2013-11-02: 8 mg via INTRAVENOUS

## 2013-11-02 MED ORDER — DEXAMETHASONE SODIUM PHOSPHATE 10 MG/ML IJ SOLN
10.0000 mg | Freq: Once | INTRAMUSCULAR | Status: AC
  Administered 2013-11-02: 10 mg via INTRAVENOUS

## 2013-11-02 MED ORDER — HEPARIN SOD (PORK) LOCK FLUSH 100 UNIT/ML IV SOLN
500.0000 [IU] | Freq: Once | INTRAVENOUS | Status: AC | PRN
  Administered 2013-11-02: 500 [IU]
  Filled 2013-11-02: qty 5

## 2013-11-02 MED ORDER — SODIUM CHLORIDE 0.9 % IJ SOLN
10.0000 mL | INTRAMUSCULAR | Status: DC | PRN
  Administered 2013-11-02: 10 mL
  Filled 2013-11-02: qty 10

## 2013-11-02 MED ORDER — DEXTROSE 5 % IV SOLN
56.0000 mg/m2 | Freq: Once | INTRAVENOUS | Status: AC
  Administered 2013-11-02: 118 mg via INTRAVENOUS
  Filled 2013-11-02: qty 59

## 2013-11-02 MED ORDER — SODIUM CHLORIDE 0.9 % IV SOLN
Freq: Once | INTRAVENOUS | Status: AC
  Administered 2013-11-02: 09:00:00 via INTRAVENOUS

## 2013-11-02 NOTE — Patient Instructions (Signed)
[facility] Discharge Instructions for Patients Receiving Chemotherapy  Today you received the following chemotherapy agents Kyprolis To help prevent nausea and vomiting after your treatment, we encourage you to take your nausea medication as prescribed.  If you develop nausea and vomiting that is not controlled by your nausea medication, call the clinic.   BELOW ARE SYMPTOMS THAT SHOULD BE REPORTED IMMEDIATELY:  *FEVER GREATER THAN 100.5 F  *CHILLS WITH OR WITHOUT FEVER  NAUSEA AND VOMITING THAT IS NOT CONTROLLED WITH YOUR NAUSEA MEDICATION  *UNUSUAL SHORTNESS OF BREATH  *UNUSUAL BRUISING OR BLEEDING  TENDERNESS IN MOUTH AND THROAT WITH OR WITHOUT PRESENCE OF ULCERS  *URINARY PROBLEMS  *BOWEL PROBLEMS  UNUSUAL RASH Items with * indicate a potential emergency and should be followed up as soon as possible.  Feel free to call the clinic you have any questions or concerns. The clinic phone number is [phone].    

## 2013-11-02 NOTE — Telephone Encounter (Signed)
S/w pt to do pt survey for [employer]

## 2013-11-03 ENCOUNTER — Other Ambulatory Visit: Payer: Self-pay | Admitting: *Deleted

## 2013-11-03 DIAGNOSIS — G8918 Other acute postprocedural pain: Secondary | ICD-10-CM

## 2013-11-03 DIAGNOSIS — J869 Pyothorax without fistula: Secondary | ICD-10-CM

## 2013-11-03 MED ORDER — OXYCODONE-ACETAMINOPHEN 5-325 MG PO TABS: 1.0000 | ORAL_TABLET | Freq: Four times a day (QID) | ORAL | Status: DC | PRN

## 2013-11-03 NOTE — Telephone Encounter (Signed)
Pt called stating that he wants to try the oxycodone again to see if it works better then the hydrocodone.  Per Dr Donnald Garre, okay to refill the oxycodone.  Pt states he is not taking the morphine.  Will advise patient to not take both hydrocodone and oxycodone.  SLJ

## 2013-11-08 ENCOUNTER — Telehealth: Payer: Self-pay | Admitting: *Deleted

## 2013-11-08 ENCOUNTER — Other Ambulatory Visit (HOSPITAL_BASED_OUTPATIENT_CLINIC_OR_DEPARTMENT_OTHER): Payer: 59

## 2013-11-08 ENCOUNTER — Ambulatory Visit (HOSPITAL_BASED_OUTPATIENT_CLINIC_OR_DEPARTMENT_OTHER): Payer: 59

## 2013-11-08 ENCOUNTER — Ambulatory Visit (HOSPITAL_BASED_OUTPATIENT_CLINIC_OR_DEPARTMENT_OTHER): Payer: 59 | Admitting: Pharmacist

## 2013-11-08 VITALS — BP 144/76 | HR 67 | Temp 97.9°F | Resp 18

## 2013-11-08 DIAGNOSIS — I776 Arteritis, unspecified: Secondary | ICD-10-CM

## 2013-11-08 DIAGNOSIS — C9002 Multiple myeloma in relapse: Secondary | ICD-10-CM

## 2013-11-08 DIAGNOSIS — C9000 Multiple myeloma not having achieved remission: Secondary | ICD-10-CM

## 2013-11-08 DIAGNOSIS — Z5112 Encounter for antineoplastic immunotherapy: Secondary | ICD-10-CM

## 2013-11-08 LAB — COMPREHENSIVE METABOLIC PANEL (CC13)
ALT: 10 U/L (ref 0–55)
AST: 7 U/L (ref 5–34)
Albumin: 2.6 g/dL — ABNORMAL LOW (ref 3.5–5.0)
Alkaline Phosphatase: 59 U/L (ref 40–150)
Anion Gap: 8 mEq/L (ref 3–11)
BUN: 6.5 mg/dL — ABNORMAL LOW (ref 7.0–26.0)
CO2: 25 mEq/L (ref 22–29)
Calcium: 7.8 mg/dL — ABNORMAL LOW (ref 8.4–10.4)
Chloride: 110 mEq/L — ABNORMAL HIGH (ref 98–109)
Creatinine: 0.6 mg/dL — ABNORMAL LOW (ref 0.7–1.3)
Glucose: 82 mg/dl (ref 70–140)
Potassium: 2.7 mEq/L — CL (ref 3.5–5.1)
Sodium: 144 mEq/L (ref 136–145)
Total Bilirubin: 0.93 mg/dL (ref 0.20–1.20)
Total Protein: 6.4 g/dL (ref 6.4–8.3)

## 2013-11-08 LAB — CBC WITH DIFFERENTIAL/PLATELET
BASO%: 1.1 % (ref 0.0–2.0)
Basophils Absolute: 0 10*3/uL (ref 0.0–0.1)
EOS%: 4.6 % (ref 0.0–7.0)
Eosinophils Absolute: 0.2 10*3/uL (ref 0.0–0.5)
HCT: 25.3 % — ABNORMAL LOW (ref 38.4–49.9)
HGB: 8.5 g/dL — ABNORMAL LOW (ref 13.0–17.1)
LYMPH%: 14.2 % (ref 14.0–49.0)
MCH: 29.8 pg (ref 27.2–33.4)
MCHC: 33.6 g/dL (ref 32.0–36.0)
MCV: 88.8 fL (ref 79.3–98.0)
MONO#: 0.6 10*3/uL (ref 0.1–0.9)
MONO%: 15.5 % — ABNORMAL HIGH (ref 0.0–14.0)
NEUT#: 2.4 10*3/uL (ref 1.5–6.5)
NEUT%: 64.6 % (ref 39.0–75.0)
Platelets: 97 10*3/uL — ABNORMAL LOW (ref 140–400)
RBC: 2.85 10*6/uL — ABNORMAL LOW (ref 4.20–5.82)
RDW: 20.4 % — ABNORMAL HIGH (ref 11.0–14.6)
WBC: 3.7 10*3/uL — ABNORMAL LOW (ref 4.0–10.3)
lymph#: 0.5 10*3/uL — ABNORMAL LOW (ref 0.9–3.3)
nRBC: 5 % — ABNORMAL HIGH (ref 0–0)

## 2013-11-08 LAB — PROTIME-INR: INR: 3.1 (ref 2.00–3.50)

## 2013-11-08 LAB — POCT INR: INR: 3.1

## 2013-11-08 MED ORDER — DEXAMETHASONE SODIUM PHOSPHATE 10 MG/ML IJ SOLN
10.0000 mg | Freq: Once | INTRAMUSCULAR | Status: AC
  Administered 2013-11-08: 10 mg via INTRAVENOUS

## 2013-11-08 MED ORDER — SODIUM CHLORIDE 0.9 % IJ SOLN
10.0000 mL | INTRAMUSCULAR | Status: DC | PRN
  Administered 2013-11-08: 10 mL
  Filled 2013-11-08: qty 10

## 2013-11-08 MED ORDER — SODIUM CHLORIDE 0.9 % IV SOLN
Freq: Once | INTRAVENOUS | Status: AC
  Administered 2013-11-08: 09:00:00 via INTRAVENOUS

## 2013-11-08 MED ORDER — DEXTROSE 5 % IV SOLN
56.0000 mg/m2 | Freq: Once | INTRAVENOUS | Status: AC
  Administered 2013-11-08: 118 mg via INTRAVENOUS
  Filled 2013-11-08: qty 59

## 2013-11-08 MED ORDER — ONDANSETRON 8 MG/50ML IVPB (CHCC)
8.0000 mg | Freq: Once | INTRAVENOUS | Status: AC
  Administered 2013-11-08: 8 mg via INTRAVENOUS

## 2013-11-08 MED ORDER — HEPARIN SOD (PORK) LOCK FLUSH 100 UNIT/ML IV SOLN
500.0000 [IU] | Freq: Once | INTRAVENOUS | Status: AC | PRN
  Administered 2013-11-08: 500 [IU]
  Filled 2013-11-08: qty 5

## 2013-11-08 MED ORDER — ONDANSETRON 8 MG/NS 50 ML IVPB
INTRAVENOUS | Status: AC
  Filled 2013-11-08: qty 8

## 2013-11-08 NOTE — Progress Notes (Signed)
INR = 3.1 on Coumadin 5 mg/day; 7.5 mg Mon. Completed Avelox.  Perhaps this is reason for elevated INR.  No bleeding per pt.  His pltc is low today but is going to get his chemo tx.  He knows to watch closely for bleeding sxs. INR at goal essentially.  I will leave his dose the same. Repeat protime 11/29/13. Ebony Hail, Pharm.D., CPP 11/08/2013@8 :54 AM

## 2013-11-08 NOTE — Patient Instructions (Signed)
[facility] Discharge Instructions for Patients Receiving Chemotherapy  Today you received the following chemotherapy agents kyprolis.  To help prevent nausea and vomiting after your treatment, we encourage you to take your nausea medication zofran.     If you develop nausea and vomiting that is not controlled by your nausea medication, call the clinic.   BELOW ARE SYMPTOMS THAT SHOULD BE REPORTED IMMEDIATELY:  *FEVER GREATER THAN 100.5 F  *CHILLS WITH OR WITHOUT FEVER  NAUSEA AND VOMITING THAT IS NOT CONTROLLED WITH YOUR NAUSEA MEDICATION  *UNUSUAL SHORTNESS OF BREATH  *UNUSUAL BRUISING OR BLEEDING  TENDERNESS IN MOUTH AND THROAT WITH OR WITHOUT PRESENCE OF ULCERS  *URINARY PROBLEMS  *BOWEL PROBLEMS  UNUSUAL RASH Items with * indicate a potential emergency and should be followed up as soon as possible.  Feel free to call the clinic you have any questions or concerns. The clinic phone number is [phone].    

## 2013-11-08 NOTE — Telephone Encounter (Signed)
K 2.7, per Dr Donnald Garre pt needs to take K daily.  Left msg with instructions.  SLJ

## 2013-11-08 NOTE — Progress Notes (Signed)
Reviewed labs with Dr. Arbutus Ped.  VO given and read back to treat despite platelets of 97K.

## 2013-11-09 ENCOUNTER — Ambulatory Visit (HOSPITAL_BASED_OUTPATIENT_CLINIC_OR_DEPARTMENT_OTHER): Payer: 59

## 2013-11-09 VITALS — BP 153/73 | HR 70 | Temp 97.0°F | Resp 18

## 2013-11-09 DIAGNOSIS — C9000 Multiple myeloma not having achieved remission: Secondary | ICD-10-CM

## 2013-11-09 DIAGNOSIS — Z5112 Encounter for antineoplastic immunotherapy: Secondary | ICD-10-CM

## 2013-11-09 DIAGNOSIS — C9002 Multiple myeloma in relapse: Secondary | ICD-10-CM

## 2013-11-09 MED ORDER — SODIUM CHLORIDE 0.9 % IJ SOLN
10.0000 mL | INTRAMUSCULAR | Status: DC | PRN
  Administered 2013-11-09: 10 mL
  Filled 2013-11-09: qty 10

## 2013-11-09 MED ORDER — DEXAMETHASONE SODIUM PHOSPHATE 10 MG/ML IJ SOLN
INTRAMUSCULAR | Status: AC
  Filled 2013-11-09: qty 1

## 2013-11-09 MED ORDER — ONDANSETRON 8 MG/NS 50 ML IVPB
INTRAVENOUS | Status: AC
  Filled 2013-11-09: qty 8

## 2013-11-09 MED ORDER — SODIUM CHLORIDE 0.9 % IV SOLN
Freq: Once | INTRAVENOUS | Status: AC
  Administered 2013-11-09: 09:00:00 via INTRAVENOUS

## 2013-11-09 MED ORDER — ONDANSETRON 8 MG/50ML IVPB (CHCC)
8.0000 mg | Freq: Once | INTRAVENOUS | Status: AC
  Administered 2013-11-09: 8 mg via INTRAVENOUS

## 2013-11-09 MED ORDER — DEXAMETHASONE SODIUM PHOSPHATE 10 MG/ML IJ SOLN
10.0000 mg | Freq: Once | INTRAMUSCULAR | Status: AC
  Administered 2013-11-09: 10 mg via INTRAVENOUS

## 2013-11-09 MED ORDER — HEPARIN SOD (PORK) LOCK FLUSH 100 UNIT/ML IV SOLN
500.0000 [IU] | Freq: Once | INTRAVENOUS | Status: AC | PRN
  Administered 2013-11-09: 500 [IU]
  Filled 2013-11-09: qty 5

## 2013-11-09 MED ORDER — DEXTROSE 5 % IV SOLN
56.0000 mg/m2 | Freq: Once | INTRAVENOUS | Status: AC
  Administered 2013-11-09: 118 mg via INTRAVENOUS
  Filled 2013-11-09: qty 59

## 2013-11-09 NOTE — Progress Notes (Signed)
Pt stated Dr Arbutus Ped talked to him about his Hgb. To continue with treatment and monitor for s/s of anemia. Pt is amenable.

## 2013-11-09 NOTE — Patient Instructions (Signed)
[facility] Discharge Instructions for Patients Receiving Chemotherapy  Today you received the following chemotherapy agents KYPROLIS  To help prevent nausea and vomiting after your treatment, we encourage you to take your nausea medication IF NEEDED   If you develop nausea and vomiting that is not controlled by your nausea medication, call the clinic.   BELOW ARE SYMPTOMS THAT SHOULD BE REPORTED IMMEDIATELY:  *FEVER GREATER THAN 100.5 F  *CHILLS WITH OR WITHOUT FEVER  NAUSEA AND VOMITING THAT IS NOT CONTROLLED WITH YOUR NAUSEA MEDICATION  *UNUSUAL SHORTNESS OF BREATH  *UNUSUAL BRUISING OR BLEEDING  TENDERNESS IN MOUTH AND THROAT WITH OR WITHOUT PRESENCE OF ULCERS  *URINARY PROBLEMS  *BOWEL PROBLEMS  UNUSUAL RASH Items with * indicate a potential emergency and should be followed up as soon as possible.  CALL IF YOU GET ANY SYMPTOMS OF ANEMIA  Feel free to call the clinic you have any questions or concerns. The clinic phone number is [phone].

## 2013-11-13 ENCOUNTER — Encounter (HOSPITAL_COMMUNITY): Payer: Self-pay | Admitting: Emergency Medicine

## 2013-11-13 ENCOUNTER — Emergency Department (HOSPITAL_COMMUNITY)
Admission: EM | Admit: 2013-11-13 | Discharge: 2013-11-13 | Disposition: A | Discharge status: Home / Self Care | Payer: 59 | Attending: Emergency Medicine | Admitting: Emergency Medicine

## 2013-11-13 DIAGNOSIS — Z7901 Long term (current) use of anticoagulants: Secondary | ICD-10-CM | POA: Insufficient documentation

## 2013-11-13 DIAGNOSIS — F911 Conduct disorder, childhood-onset type: Secondary | ICD-10-CM | POA: Insufficient documentation

## 2013-11-13 DIAGNOSIS — I10 Essential (primary) hypertension: Secondary | ICD-10-CM | POA: Insufficient documentation

## 2013-11-13 DIAGNOSIS — Z79899 Other long term (current) drug therapy: Secondary | ICD-10-CM | POA: Insufficient documentation

## 2013-11-13 DIAGNOSIS — Z87898 Personal history of other specified conditions: Secondary | ICD-10-CM | POA: Insufficient documentation

## 2013-11-13 DIAGNOSIS — R454 Irritability and anger: Secondary | ICD-10-CM
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
## 2013-11-13 NOTE — ED Notes (Signed)
Pt contracts for safety of self and others

## 2013-11-15 ENCOUNTER — Telehealth: Payer: Self-pay | Admitting: Internal Medicine

## 2013-11-15 ENCOUNTER — Ambulatory Visit (HOSPITAL_BASED_OUTPATIENT_CLINIC_OR_DEPARTMENT_OTHER): Payer: 59 | Admitting: Physician Assistant

## 2013-11-15 ENCOUNTER — Ambulatory Visit (HOSPITAL_BASED_OUTPATIENT_CLINIC_OR_DEPARTMENT_OTHER): Payer: 59

## 2013-11-15 ENCOUNTER — Encounter: Payer: Self-pay | Admitting: Physician Assistant

## 2013-11-15 ENCOUNTER — Other Ambulatory Visit (HOSPITAL_BASED_OUTPATIENT_CLINIC_OR_DEPARTMENT_OTHER): Payer: 59

## 2013-11-15 ENCOUNTER — Encounter (INDEPENDENT_AMBULATORY_CARE_PROVIDER_SITE_OTHER): Payer: Self-pay

## 2013-11-15 VITALS — BP 151/77 | HR 61 | Temp 98.0°F | Resp 18 | Ht 63.0 in | Wt 225.7 lb

## 2013-11-15 DIAGNOSIS — Z5112 Encounter for antineoplastic immunotherapy: Secondary | ICD-10-CM

## 2013-11-15 DIAGNOSIS — C9002 Multiple myeloma in relapse: Secondary | ICD-10-CM

## 2013-11-15 DIAGNOSIS — C9000 Multiple myeloma not having achieved remission: Secondary | ICD-10-CM

## 2013-11-15 LAB — COMPREHENSIVE METABOLIC PANEL (CC13)
ALT: 34 U/L (ref 0–55)
Anion Gap: 7 mEq/L (ref 3–11)
BUN: 9.1 mg/dL (ref 7.0–26.0)
CO2: 22 mEq/L (ref 22–29)
Calcium: 8 mg/dL — ABNORMAL LOW (ref 8.4–10.4)
Chloride: 113 mEq/L — ABNORMAL HIGH (ref 98–109)
Sodium: 142 mEq/L (ref 136–145)
Total Protein: 6.3 g/dL — ABNORMAL LOW (ref 6.4–8.3)

## 2013-11-15 LAB — CBC WITH DIFFERENTIAL/PLATELET
Eosinophils Absolute: 0.5 10*3/uL (ref 0.0–0.5)
MCH: 29.7 pg (ref 27.2–33.4)
MONO#: 0.9 10*3/uL (ref 0.1–0.9)
NEUT#: 3.3 10*3/uL (ref 1.5–6.5)
NEUT%: 62 % (ref 39.0–75.0)
RBC: 2.79 10*6/uL — ABNORMAL LOW (ref 4.20–5.82)
RDW: 21.3 % — ABNORMAL HIGH (ref 11.0–14.6)
WBC: 5.2 10*3/uL (ref 4.0–10.3)
nRBC: 3 % — ABNORMAL HIGH (ref 0–0)

## 2013-11-15 MED ORDER — SODIUM CHLORIDE 0.9 % IV SOLN
Freq: Once | INTRAVENOUS | Status: AC
  Administered 2013-11-15: 10:00:00 via INTRAVENOUS

## 2013-11-15 MED ORDER — SODIUM CHLORIDE 0.9 % IJ SOLN
10.0000 mL | INTRAMUSCULAR | Status: DC | PRN
  Administered 2013-11-15: 10 mL
  Filled 2013-11-15: qty 10

## 2013-11-15 MED ORDER — DEXAMETHASONE SODIUM PHOSPHATE 10 MG/ML IJ SOLN
10.0000 mg | Freq: Once | INTRAMUSCULAR | Status: AC
  Administered 2013-11-15: 10 mg via INTRAVENOUS

## 2013-11-15 MED ORDER — HEPARIN SOD (PORK) LOCK FLUSH 100 UNIT/ML IV SOLN
500.0000 [IU] | Freq: Once | INTRAVENOUS | Status: AC | PRN
  Administered 2013-11-15: 500 [IU]
  Filled 2013-11-15: qty 5

## 2013-11-15 MED ORDER — DEXAMETHASONE SODIUM PHOSPHATE 10 MG/ML IJ SOLN
INTRAMUSCULAR | Status: AC
  Filled 2013-11-15: qty 1

## 2013-11-15 MED ORDER — ONDANSETRON 8 MG/NS 50 ML IVPB
INTRAVENOUS | Status: AC
  Filled 2013-11-15: qty 8

## 2013-11-15 MED ORDER — ONDANSETRON 8 MG/50ML IVPB (CHCC)
8.0000 mg | Freq: Once | INTRAVENOUS | Status: AC
  Administered 2013-11-15: 8 mg via INTRAVENOUS

## 2013-11-15 MED ORDER — CARFILZOMIB CHEMO INJECTION 60 MG
56.0000 mg/m2 | Freq: Once | INTRAVENOUS | Status: AC
  Administered 2013-11-15: 118 mg via INTRAVENOUS
  Filled 2013-11-15: qty 59

## 2013-11-15 NOTE — Progress Notes (Addendum)
[facility] [phone]   [phone]  SHARED VISIT PROGRESS NOTE  [name], MD [address] [place] [address]  Principle Diagnosis:  #1 recurrent multiple myeloma IgG kappa subtype diagnosed in June of 2008  #2 history of vasculitis and thrombosis of skin lesions   Prior Therapy:  #1 status post palliative radiotherapy to the left hip under the care of Dr. Roselind Messier  #2 status post 5 cycles of systemic chemotherapy with Revlimid and low dose Decadron. Last dose given June 2009 with good response.  #3 status post autologous peripheral blood stem cell transplant at [facility] on 02/02/2008.  #4 the patient had evidence for disease recurrence in December 2010.  #5 Revlimid 25 mg by mouth daily for 21 days every 4 weeks in addition to Decadron 40 mg orally on a weekly basis. The patient is status post 28 cycles, discontinued today secondary to disease progression.  #6 Systemic chemotherapy with Velcade 1.3 mg/M2 on days 1, 4, 8 and 11 in addition to Doxil 30 mg/M2 on day 4 and Decadron 40 mg by mouth on weekly basis every 3 weeks. Status post 3 cycles, last dose was given 05/04/2012 discontinued secondary to intolerance.  #7 Salvage therapy treatment with the Pomalyst 4 mg by mouth daily for 21 days every 28 days as well as Cytoxan 50 mg by mouth every other day and dexamethasone 40 mg by mouth once weekly. Therapy beginning 09/25/2012, status post 2 cycles discontinued secondary to disease progression and intolerance.  #8 Kyprolis (Carfilzomib) 27 mg/M2 on days 1, 2, 8, 9, 15 and 16 every 4 weeks. First dose on 02/08/2013. This would be concurrent with the dexamethasone 40 mg by mouth on a weekly basis. Status post 3 cycles with disease progression.  #9 Systemic chemotherapy with Carfilzomib 27 mg/M2 days 1,2 , 8, 9, 15 and 16, Cytoxan 300 mg/M2 and dexamethasone 40 mg by mouth weekly every 4 weeks. Status post 4 cycles.  Last dose was given 08/09/2013 discontinued today secondary to disease progression.  Current therapy:  1) Systemic chemotherapy with Carfilzomib 56 mg/M2 days 1,2 , 8, 9, 15 and 16, Pomalyst 3 mg by mouth daily for 21 days every 4 weeks and dexamethasone 40 mg by mouth weekly every 4 weeks. First cycle started on 10/05/2013. Status post 1 cycle. The patient missed day 8 and 9 of the first cycle secondary to thrombocytopenia. Status post days 1, 2, 8 and 9 of cycle 2 2) Zometa 4 mg IV given every 3 months.    INTERVAL HISTORY: [name] 52 y.o. male returns to the clinic today for followup visit. He is tolerating his chemotherapy relatively well and voices no specific complaints today.  He has no significant fatigue or weakness. He has no chest pain, shortness of breath, cough or hemoptysis. The patient denied having any nausea or vomiting. He has no weight loss or night sweats. He is here today to proceed with day 15 and 16 of cycle #2 of his chemotherapy.  MEDICAL HISTORY: Past Medical History  Diagnosis Date  . Multiple myeloma 09/24/2011  . Hypertension     ALLERGIES:  is allergic to red dye and other.  MEDICATIONS:  Current Outpatient Prescriptions  Medication Sig Dispense Refill  . amLODipine (NORVASC) 10 MG tablet Take 10 mg by mouth daily.      . benzonatate (TESSALON) 200 MG capsule Take 200 mg by mouth 3 (three) times daily as  needed for cough.       . dexamethasone (DECADRON) 4 MG tablet Take 40 mg by mouth once a week. Mondays with chemo      . HYDROcodone-acetaminophen (NORCO) 10-325 MG per tablet Take 1-2 tablets by mouth every 6 (six) hours as needed. For pain.  40 tablet  0  . morphine (MS CONTIN) 30 MG 12 hr tablet Take 30 mg by mouth every 12 (twelve) hours as needed for pain.      . Nebivolol HCl (BYSTOLIC) 20 MG TABS Take 30 mg by mouth daily.      [name] omeprazole (PRILOSEC) 40 MG capsule Take 40 mg by mouth daily as needed (for heartburn).       [name]  oxyCODONE-acetaminophen (PERCOCET/ROXICET) 5-325 MG per tablet Take 1 tablet by mouth every 6 (six) hours as needed for moderate pain.      . pomalidomide (POMALYST) 3 MG capsule Take 3 mg by mouth daily. Take with water on days 1-21. Repeat every 28 days.      . potassium chloride SA (K-DUR,KLOR-CON) 20 MEQ tablet TAKE 1 TABLET BY MOUTH EVERY DAY  30 tablet  0  . pregabalin (LYRICA) 50 MG capsule Take 1 capsule (50 mg total) by mouth 3 (three) times daily.  270 capsule  1  . PRESCRIPTION MEDICATION Receives chemo at [facility] weekly oncologist is Dr. Shirline Frees      . prochlorperazine (COMPAZINE) 10 MG tablet Take 10 mg by mouth every 6 (six) hours as needed for nausea.      [name] warfarin (COUMADIN) 5 MG tablet Take 5-7.5 mg by mouth daily. Take 5 mg by mouth daily except 7.5 mg on Mondays      . HYDROcodone-homatropine (HYCODAN) 5-1.5 MG/5ML syrup        No current facility-administered medications for this visit.   Facility-Administered Medications Ordered in Other Visits  Medication Dose Route Frequency Provider Last Rate Last Dose  . carfilzomib (KYPROLIS) 118 mg in dextrose 5 % 100 mL chemo infusion  56 mg/m2 (Treatment Plan Actual) Intravenous Once Si Gaul, MD      . heparin lock flush 100 unit/mL  500 Units Intracatheter Once PRN Si Gaul, MD      . sodium chloride 0.9 % injection 10 mL  10 mL Intracatheter PRN Si Gaul, MD   10 mL at 07/27/13 1617  . sodium chloride 0.9 % injection 10 mL  10 mL Intracatheter PRN Si Gaul, MD        SURGICAL HISTORY:  Past Surgical History  Procedure Laterality Date  . Video bronchoscopy  12/11/2012    Procedure: VIDEO BRONCHOSCOPY;  Surgeon: Kerin Perna, MD;  Location: [facility] OR;  Service: Thoracic;  Laterality: N/A;  . Video assisted thoracoscopy  12/11/2012    Procedure: VIDEO ASSISTED THORACOSCOPY;  Surgeon: Kerin Perna, MD;  Location: [facility] OR;  Service: Thoracic;  Laterality: Right;  . Decortication  12/11/2012    Procedure:  DECORTICATION;  Surgeon: Kerin Perna, MD;  Location: [facility] OR;  Service: Thoracic;  Laterality: N/A;    REVIEW OF SYSTEMS:  Constitutional: negative Eyes: negative Ears, nose, mouth, throat, and face: negative Respiratory: negative Cardiovascular: negative Gastrointestinal: negative Genitourinary:negative Integument/breast: negative Hematologic/lymphatic: negative Musculoskeletal:positive for bone pain and Right lateral rib cage pain Neurological: negative Behavioral/Psych: negative Endocrine: negative Allergic/Immunologic: negative   PHYSICAL EXAMINATION: General appearance: alert, cooperative, fatigued and no distress Head: Normocephalic, without obvious abnormality, atraumatic Neck: no adenopathy, no JVD, supple, symmetrical, trachea midline and thyroid not enlarged,  symmetric, no tenderness/mass/nodules Lymph nodes: Cervical, supraclavicular, and axillary nodes normal. Resp: clear to auscultation bilaterally Back: symmetric, no curvature. ROM normal. No CVA tenderness. Cardio: regular rate and rhythm, S1, S2 normal, no murmur, click, rub or gallop GI: soft, non-tender; bowel sounds normal; no masses,  no organomegaly Extremities: extremities normal, atraumatic, no cyanosis or edema Neurologic: Alert and oriented X 3, normal strength and tone. Normal symmetric reflexes. Normal coordination and gait  ECOG PERFORMANCE STATUS: 1 - Symptomatic but completely ambulatory  Blood pressure 151/77, pulse 61, temperature 98 F (36.7 C), temperature source Oral, resp. rate 18, height 5\' 3"  (1.6 m), weight 225 lb 11.2 oz (102.377 kg).  LABORATORY DATA: Lab Results  Component Value Date   WBC 5.2 11/15/2013   HGB 8.3* 11/15/2013   HCT 25.3* 11/15/2013   MCV 90.7 11/15/2013   PLT 133* 11/15/2013      Chemistry      Component Value Date/Time   NA 144 11/08/2013 0810   NA 136 03/16/2013 1930   K 2.7* 11/08/2013 0810   K 3.8 03/16/2013 1930   CL 112* 05/10/2013 0849   CL 110  03/16/2013 1930   CO2 25 11/08/2013 0810   CO2 16* 03/16/2013 1930   BUN 6.5* 11/08/2013 0810   BUN 22 03/16/2013 1930   CREATININE 0.6* 11/08/2013 0810   CREATININE 1.24 03/16/2013 1930      Component Value Date/Time   CALCIUM 7.8* 11/08/2013 0810   CALCIUM 8.2* 03/16/2013 1930   ALKPHOS 59 11/08/2013 0810   ALKPHOS 31* 03/16/2013 1930   AST 7 11/08/2013 0810   AST 11 03/16/2013 1930   ALT 10 11/08/2013 0810   ALT 15 03/16/2013 1930   BILITOT 0.93 11/08/2013 0810   BILITOT 0.2* 03/16/2013 1930       ASSESSMENT AND PLAN: The patient is a very pleasant 52 years old African American male with recurrent multiple myeloma currently on treatment with Carfilzomib 56 mg/M2, Pomalyst 3 mg by mouth daily for 21 days every 3 weeks in addition to Decadron 40 mg on a weekly basis. He missed day 8 and 9 of the first cycle secondary to thrombocytopenia. His counts are relatively stable today with only a slight decrease in his hemoglobin to 8.3 g/dL however he is asymptomatic at this level today. Overall he is tolerating this chemotherapy relatively well. Patient was discussed with him also seen by Dr. Arbutus Ped. He will proceed with today's #15 and 16 of cycle #2. We'll have him obtain repeat protein studies to reevaluate his disease on 11/25/2013 and followup with Dr. Arbutus Ped in 2 weeks to discuss the results.  He was advised to call immediately if he has any concerning symptoms in the interval.   The patient voices understanding of current disease status and treatment options and is in agreement with the current care plan.  All questions were answered. The patient knows to call the clinic with any problems, questions or concerns. We can certainly see the patient much sooner if necessary.  Conni Slipper, PA-C 11/15/2013  ADDENDUM: Hematology/Oncology Attending: I had a face to face encounter with the patient. I recommended his care plan. This is a very pleasant 52 years old [place] American male with  recurrent multiple myeloma. He is currently on treatment with high-dose Carfilzomib, Cytoxan and dexamethasone. He is rating his treatment fairly well except for initial thrombocytopenia which is currently resolved. He is status post 1 cycle of his treatment. I recommended for the patient to proceed with cycle #  2 today as scheduled. He would come back for follow up visit in 2 weeks for evaluation and management any adverse effect of his treatment. We will consider the patient for PRBCs transfusion of his hemoglobin is less than 8.0 G./DL. He was advised to call immediately if he has any concerning symptoms in the interval.   Lajuana Matte., MD 11/16/2013

## 2013-11-15 NOTE — Patient Instructions (Signed)
[facility] Discharge Instructions for Patients Receiving Chemotherapy  Today you received the following chemotherapy agents Kyprolis.  To help prevent nausea and vomiting after your treatment, we encourage you to take your nausea medication if needed.  If you develop nausea and vomiting that is not controlled by your nausea medication, call the clinic.   BELOW ARE SYMPTOMS THAT SHOULD BE REPORTED IMMEDIATELY:  *FEVER GREATER THAN 100.5 F  *CHILLS WITH OR WITHOUT FEVER  NAUSEA AND VOMITING THAT IS NOT CONTROLLED WITH YOUR NAUSEA MEDICATION  *UNUSUAL SHORTNESS OF BREATH  *UNUSUAL BRUISING OR BLEEDING  TENDERNESS IN MOUTH AND THROAT WITH OR WITHOUT PRESENCE OF ULCERS  *URINARY PROBLEMS  *BOWEL PROBLEMS  UNUSUAL RASH Items with * indicate a potential emergency and should be followed up as soon as possible.  Feel free to call the clinic you have any questions or concerns. The clinic phone number is [phone].    

## 2013-11-15 NOTE — Telephone Encounter (Signed)
gv and printed appt sched and avs for pt for DEC adn Jan 2015....sedadded tx. °

## 2013-11-15 NOTE — Patient Instructions (Signed)
Continue with labs and chemotherapy as scheduled Followup with Dr. Arbutus Ped in 2 weeks with repeat protein studies to reevaluate your disease

## 2013-11-15 NOTE — ED Provider Notes (Signed)
Medical screening examination/treatment/procedure(s) were performed by non-physician practitioner and as supervising physician I was immediately available for consultation/collaboration.  EKG Interpretation   None         Junius Argyle, MD 11/15/13 1234

## 2013-11-16 ENCOUNTER — Ambulatory Visit (HOSPITAL_BASED_OUTPATIENT_CLINIC_OR_DEPARTMENT_OTHER): Payer: 59

## 2013-11-16 VITALS — BP 154/74 | HR 56 | Temp 97.9°F | Resp 20

## 2013-11-16 DIAGNOSIS — C9000 Multiple myeloma not having achieved remission: Secondary | ICD-10-CM

## 2013-11-16 DIAGNOSIS — Z5112 Encounter for antineoplastic immunotherapy: Secondary | ICD-10-CM

## 2013-11-16 MED ORDER — SODIUM CHLORIDE 0.9 % IJ SOLN
10.0000 mL | INTRAMUSCULAR | Status: DC | PRN
  Administered 2013-11-16: 10 mL
  Filled 2013-11-16: qty 10

## 2013-11-16 MED ORDER — ONDANSETRON 8 MG/NS 50 ML IVPB
INTRAVENOUS | Status: AC
  Filled 2013-11-16: qty 8

## 2013-11-16 MED ORDER — ONDANSETRON 8 MG/50ML IVPB (CHCC)
8.0000 mg | Freq: Once | INTRAVENOUS | Status: AC
  Administered 2013-11-16: 8 mg via INTRAVENOUS

## 2013-11-16 MED ORDER — DEXTROSE 5 % IV SOLN
56.0000 mg/m2 | Freq: Once | INTRAVENOUS | Status: AC
  Administered 2013-11-16: 118 mg via INTRAVENOUS
  Filled 2013-11-16: qty 59

## 2013-11-16 MED ORDER — SODIUM CHLORIDE 0.9 % IV SOLN
Freq: Once | INTRAVENOUS | Status: AC
  Administered 2013-11-16: 10:00:00 via INTRAVENOUS

## 2013-11-16 MED ORDER — HEPARIN SOD (PORK) LOCK FLUSH 100 UNIT/ML IV SOLN
500.0000 [IU] | Freq: Once | INTRAVENOUS | Status: AC | PRN
  Administered 2013-11-16: 500 [IU]
  Filled 2013-11-16: qty 5

## 2013-11-16 MED ORDER — DEXAMETHASONE SODIUM PHOSPHATE 10 MG/ML IJ SOLN
INTRAMUSCULAR | Status: AC
  Filled 2013-11-16: qty 1

## 2013-11-16 MED ORDER — DEXAMETHASONE SODIUM PHOSPHATE 10 MG/ML IJ SOLN
10.0000 mg | Freq: Once | INTRAMUSCULAR | Status: AC
  Administered 2013-11-16: 10 mg via INTRAVENOUS

## 2013-11-16 NOTE — Patient Instructions (Signed)
[facility] Discharge Instructions for Patients Receiving Chemotherapy  Today you received the following chemotherapy agents Kyprolis To help prevent nausea and vomiting after your treatment, we encourage you to take your nausea medication as prescribed.  If you develop nausea and vomiting that is not controlled by your nausea medication, call the clinic.   BELOW ARE SYMPTOMS THAT SHOULD BE REPORTED IMMEDIATELY:  *FEVER GREATER THAN 100.5 F  *CHILLS WITH OR WITHOUT FEVER  NAUSEA AND VOMITING THAT IS NOT CONTROLLED WITH YOUR NAUSEA MEDICATION  *UNUSUAL SHORTNESS OF BREATH  *UNUSUAL BRUISING OR BLEEDING  TENDERNESS IN MOUTH AND THROAT WITH OR WITHOUT PRESENCE OF ULCERS  *URINARY PROBLEMS  *BOWEL PROBLEMS  UNUSUAL RASH Items with * indicate a potential emergency and should be followed up as soon as possible.  Feel free to call the clinic you have any questions or concerns. The clinic phone number is [phone].    

## 2013-11-22 ENCOUNTER — Telehealth: Payer: Self-pay | Admitting: Medical Oncology

## 2013-11-22 DIAGNOSIS — C9000 Multiple myeloma not having achieved remission: Secondary | ICD-10-CM

## 2013-11-22 NOTE — Telephone Encounter (Signed)
Reports bilateral pedal edema. Asking if related to Kyprolis.  Per Dr Julien Nordmann the chemo can cause swelling . He ordered lasix 20 mg po daily , and ECHO.

## 2013-11-23 ENCOUNTER — Telehealth: Payer: Self-pay | Admitting: Internal Medicine

## 2013-11-23 MED ORDER — FUROSEMIDE 20 MG PO TABS: 20.0000 mg | ORAL_TABLET | Freq: Every day | ORAL | Status: DC

## 2013-11-23 NOTE — Telephone Encounter (Signed)
GVE A COPY OF THE ECHO ORDER TO LINDA DAVIS TO [place].

## 2013-11-23 NOTE — Telephone Encounter (Signed)
Called and spoke with the patient.  He verbalized understanding regarding taking lasix 20mg  daily and that schedulers will be in touch regarding the 2D echo.  SLJ

## 2013-11-23 NOTE — Telephone Encounter (Signed)
lmonvm advising the pt of his echo appt on 11/25/2013@10 :00am at [facility] admitting.

## 2013-11-23 NOTE — Addendum Note (Signed)
Addended by: Britt Bottom on: 11/23/2013 10:48 AM   Modules accepted: Orders

## 2013-11-25 ENCOUNTER — Encounter: Payer: Self-pay | Admitting: Internal Medicine

## 2013-11-25 ENCOUNTER — Other Ambulatory Visit (HOSPITAL_BASED_OUTPATIENT_CLINIC_OR_DEPARTMENT_OTHER): Payer: 59

## 2013-11-25 ENCOUNTER — Ambulatory Visit (HOSPITAL_BASED_OUTPATIENT_CLINIC_OR_DEPARTMENT_OTHER): Payer: 59 | Admitting: Internal Medicine

## 2013-11-25 ENCOUNTER — Ambulatory Visit: Payer: 59 | Admitting: Internal Medicine

## 2013-11-25 ENCOUNTER — Ambulatory Visit (HOSPITAL_COMMUNITY)
Admission: RE | Admit: 2013-11-25 | Discharge: 2013-11-25 | Disposition: A | Discharge status: Home / Self Care | Payer: 59 | Source: Ambulatory Visit | Attending: Internal Medicine | Admitting: Internal Medicine

## 2013-11-25 VITALS — BP 158/75 | HR 75 | Temp 99.7°F | Resp 18 | Ht 63.0 in | Wt 223.9 lb

## 2013-11-25 DIAGNOSIS — Z7901 Long term (current) use of anticoagulants: Secondary | ICD-10-CM | POA: Insufficient documentation

## 2013-11-25 DIAGNOSIS — I059 Rheumatic mitral valve disease, unspecified: Secondary | ICD-10-CM | POA: Insufficient documentation

## 2013-11-25 DIAGNOSIS — C9000 Multiple myeloma not having achieved remission: Secondary | ICD-10-CM

## 2013-11-25 DIAGNOSIS — J90 Pleural effusion, not elsewhere classified: Secondary | ICD-10-CM | POA: Insufficient documentation

## 2013-11-25 DIAGNOSIS — R609 Edema, unspecified: Secondary | ICD-10-CM | POA: Insufficient documentation

## 2013-11-25 DIAGNOSIS — J189 Pneumonia, unspecified organism: Secondary | ICD-10-CM | POA: Insufficient documentation

## 2013-11-25 DIAGNOSIS — C9002 Multiple myeloma in relapse: Secondary | ICD-10-CM

## 2013-11-25 DIAGNOSIS — Z8249 Family history of ischemic heart disease and other diseases of the circulatory system: Secondary | ICD-10-CM | POA: Insufficient documentation

## 2013-11-25 DIAGNOSIS — R059 Cough, unspecified: Secondary | ICD-10-CM

## 2013-11-25 DIAGNOSIS — I079 Rheumatic tricuspid valve disease, unspecified: Secondary | ICD-10-CM | POA: Insufficient documentation

## 2013-11-25 DIAGNOSIS — R05 Cough: Secondary | ICD-10-CM

## 2013-11-25 DIAGNOSIS — G8929 Other chronic pain: Secondary | ICD-10-CM | POA: Insufficient documentation

## 2013-11-25 DIAGNOSIS — I10 Essential (primary) hypertension: Secondary | ICD-10-CM | POA: Insufficient documentation

## 2013-11-25 LAB — COMPREHENSIVE METABOLIC PANEL (CC13)
ALT: 11 U/L (ref 0–55)
ANION GAP: 10 meq/L (ref 3–11)
AST: 9 U/L (ref 5–34)
Albumin: 3.4 g/dL — ABNORMAL LOW (ref 3.5–5.0)
Alkaline Phosphatase: 72 U/L (ref 40–150)
BUN: 7.1 mg/dL (ref 7.0–26.0)
CALCIUM: 8 mg/dL — AB (ref 8.4–10.4)
CHLORIDE: 108 meq/L (ref 98–109)
CO2: 23 meq/L (ref 22–29)
CREATININE: 0.7 mg/dL (ref 0.7–1.3)
GLUCOSE: 91 mg/dL (ref 70–140)
Potassium: 3.4 mEq/L — ABNORMAL LOW (ref 3.5–5.1)
Sodium: 142 mEq/L (ref 136–145)
Total Bilirubin: 1.59 mg/dL — ABNORMAL HIGH (ref 0.20–1.20)
Total Protein: 6.5 g/dL (ref 6.4–8.3)

## 2013-11-25 LAB — CBC WITH DIFFERENTIAL/PLATELET
BASO%: 1.7 % (ref 0.0–2.0)
Basophils Absolute: 0.1 10*3/uL (ref 0.0–0.1)
EOS%: 6.4 % (ref 0.0–7.0)
Eosinophils Absolute: 0.2 10*3/uL (ref 0.0–0.5)
HCT: 28.4 % — ABNORMAL LOW (ref 38.4–49.9)
HGB: 9 g/dL — ABNORMAL LOW (ref 13.0–17.1)
LYMPH%: 11.4 % — ABNORMAL LOW (ref 14.0–49.0)
MCH: 30.2 pg (ref 27.2–33.4)
MCHC: 31.7 g/dL — ABNORMAL LOW (ref 32.0–36.0)
MCV: 95.3 fL (ref 79.3–98.0)
MONO#: 0.6 10*3/uL (ref 0.1–0.9)
MONO%: 17.5 % — AB (ref 0.0–14.0)
NEUT%: 63 % (ref 39.0–75.0)
NEUTROS ABS: 2.3 10*3/uL (ref 1.5–6.5)
NRBC: 5 % — AB (ref 0–0)
Platelets: 212 10*3/uL (ref 140–400)
RBC: 2.98 10*6/uL — AB (ref 4.20–5.82)
RDW: 23.9 % — AB (ref 11.0–14.6)
WBC: 3.6 10*3/uL — AB (ref 4.0–10.3)
lymph#: 0.4 10*3/uL — ABNORMAL LOW (ref 0.9–3.3)

## 2013-11-25 LAB — LACTATE DEHYDROGENASE (CC13): LDH: 325 U/L — AB (ref 125–245)

## 2013-11-25 MED ORDER — AZITHROMYCIN 250 MG PO TABS: ORAL_TABLET | ORAL | Status: DC

## 2013-11-25 MED ORDER — HYDROCOD POLST-CHLORPHEN POLST 10-8 MG/5ML PO LQCR: 5.0000 mL | Freq: Two times a day (BID) | ORAL | Status: DC | PRN

## 2013-11-25 NOTE — Progress Notes (Signed)
[place] Telephone:(336) [phone]   [phone]) [phone]  SHARED VISIT PROGRESS NOTE  [name]., MD [address]  [place] [address]  Principle Diagnosis:  #1 recurrent multiple myeloma IgG kappa subtype diagnosed in June of 2008  #2 history of vasculitis and thrombosis of skin lesions   Prior Therapy:  #1 status post palliative radiotherapy to the left hip under the care of Dr. Sondra Come  #2 status post 5 cycles of systemic chemotherapy with Revlimid and low dose Decadron. Last dose given June 2009 with good response.  #3 status post autologous peripheral blood stem cell transplant at [facility] on 02/02/2008.  #4 the patient had evidence for disease recurrence in December 2010.  #5 Revlimid 25 mg by mouth daily for 21 days every 4 weeks in addition to Decadron 40 mg orally on a weekly basis. The patient is status post 28 cycles, discontinued today secondary to disease progression.  #6 Systemic chemotherapy with Velcade 1.3 mg/M2 on days 1, 4, 8 and 11 in addition to Doxil 30 mg/M2 on day 4 and Decadron 40 mg by mouth on weekly basis every 3 weeks. Status post 3 cycles, last dose was given 05/04/2012 discontinued secondary to intolerance.  #7 Salvage therapy treatment with the Pomalyst 4 mg by mouth daily for 21 days every 28 days as well as Cytoxan 50 mg by mouth every other day and dexamethasone 40 mg by mouth once weekly. Therapy beginning 09/25/2012, status post 2 cycles discontinued secondary to disease progression and intolerance.  #8 Kyprolis (Carfilzomib) 27 mg/M2 on days 1, 2, 8, 9, 15 and 16 every 4 weeks. First dose on 02/08/2013. This would be concurrent with the dexamethasone 40 mg by mouth on a weekly basis. Status post 3 cycles with disease progression.  #9 Systemic chemotherapy with Carfilzomib 27 mg/M2 days 1,2 , 8, 9, 15 and 16, Cytoxan 300 mg/M2 and dexamethasone 40 mg by mouth weekly every 4 weeks. Status post 4 cycles.  Last dose was given 08/09/2013 discontinued today secondary to disease progression.  Current therapy:  1) Systemic chemotherapy with Carfilzomib 56 mg/M2 days 1,2 , 8, 9, 15 and 16, Pomalyst 3 mg by mouth daily for 21 days every 4 weeks and dexamethasone 40 mg by mouth weekly every 4 weeks. First cycle started on 10/05/2013. Status post 2 cycles. The patient missed day 8 and 9 of the first cycle secondary to thrombocytopenia.  2) Zometa 4 mg IV given every 3 months.    INTERVAL HISTORY: [name] 53 y.o. male returns to the clinic today for followup visit accompanied by his wife. The patient is not feeling well today and he has been complaining of cough and chest congestion with low-grade fever over the last few days. He had significant improvement in the swelling of the lower extremities after he was started on treatment with Lasix. He had repeat 2-D echo performed earlier today which showed ejection fraction in the range of 55-60% with no other significant abnormalities. He denied having any significant complaints today. He has no significant fatigue or weakness. He has no chest pain, shortness of breath, cough or hemoptysis. The patient denied having any nausea or vomiting. He has no weight loss or night sweats.   MEDICAL HISTORY: Past Medical History  Diagnosis Date  . Multiple myeloma 09/24/2011  . Hypertension     ALLERGIES:  is allergic to red dye and other.  MEDICATIONS:  Current Outpatient Prescriptions  Medication  Sig Dispense Refill  . amLODipine (NORVASC) 10 MG tablet Take 10 mg by mouth daily.      . benzonatate (TESSALON) 200 MG capsule Take 200 mg by mouth 3 (three) times daily as needed for cough.       . dexamethasone (DECADRON) 4 MG tablet Take 40 mg by mouth once a week. Mondays with chemo      . furosemide (LASIX) 20 MG tablet Take 1 tablet (20 mg total) by mouth daily.  30 tablet  0  . HYDROcodone-acetaminophen (NORCO) 10-325 MG per tablet Take 1-2 tablets by  mouth every 6 (six) hours as needed. For pain.  40 tablet  0  . HYDROcodone-homatropine (HYCODAN) 5-1.5 MG/5ML syrup       . morphine (MS CONTIN) 30 MG 12 hr tablet Take 30 mg by mouth every 12 (twelve) hours as needed for pain.      . Nebivolol HCl (BYSTOLIC) 20 MG TABS Take 30 mg by mouth daily.      [name] omeprazole (PRILOSEC) 40 MG capsule Take 40 mg by mouth daily as needed (for heartburn).       . pomalidomide (POMALYST) 3 MG capsule Take 3 mg by mouth daily. Take with water on days 1-21. Repeat every 28 days.      . potassium chloride SA (K-DUR,KLOR-CON) 20 MEQ tablet TAKE 1 TABLET BY MOUTH EVERY DAY  30 tablet  0  . pregabalin (LYRICA) 50 MG capsule Take 1 capsule (50 mg total) by mouth 3 (three) times daily.  270 capsule  1  . PRESCRIPTION MEDICATION Receives chemo at [facility] weekly oncologist is Dr. Earlie Server      . prochlorperazine (COMPAZINE) 10 MG tablet Take 10 mg by mouth every 6 (six) hours as needed for nausea.      [name] warfarin (COUMADIN) 5 MG tablet Take 5-7.5 mg by mouth daily. Take 5 mg by mouth daily except 7.5 mg on Mondays      . oxyCODONE-acetaminophen (PERCOCET/ROXICET) 5-325 MG per tablet Take 1 tablet by mouth every 6 (six) hours as needed for moderate pain.       No current facility-administered medications for this visit.   Facility-Administered Medications Ordered in Other Visits  Medication Dose Route Frequency Provider Last Rate Last Dose  . sodium chloride 0.9 % injection 10 mL  10 mL Intracatheter PRN Curt Bears, MD   10 mL at 07/27/13 1617    SURGICAL HISTORY:  Past Surgical History  Procedure Laterality Date  . Video bronchoscopy  12/11/2012    Procedure: VIDEO BRONCHOSCOPY;  Surgeon: Ivin Poot, MD;  Location: [place];  Service: Thoracic;  Laterality: N/A;  . Video assisted thoracoscopy  12/11/2012    Procedure: VIDEO ASSISTED THORACOSCOPY;  Surgeon: Ivin Poot, MD;  Location: [place];  Service: Thoracic;  Laterality: Right;  . Decortication  12/11/2012      Procedure: DECORTICATION;  Surgeon: Ivin Poot, MD;  Location: [facility] OR;  Service: Thoracic;  Laterality: N/A;    REVIEW OF SYSTEMS:  Constitutional: negative Eyes: negative Ears, nose, mouth, throat, and face: negative Respiratory: negative Cardiovascular: negative Gastrointestinal: negative Genitourinary:negative Integument/breast: negative Hematologic/lymphatic: negative Musculoskeletal:positive for bone pain and Right lateral rib cage pain Neurological: negative Behavioral/Psych: negative Endocrine: negative Allergic/Immunologic: negative   PHYSICAL EXAMINATION: General appearance: alert, cooperative, fatigued and no distress Head: Normocephalic, without obvious abnormality, atraumatic Neck: no adenopathy, no JVD, supple, symmetrical, trachea midline and thyroid not enlarged, symmetric, no tenderness/mass/nodules Lymph nodes: Cervical, supraclavicular, and axillary nodes normal.  Resp: clear to auscultation bilaterally Back: symmetric, no curvature. ROM normal. No CVA tenderness. Cardio: regular rate and rhythm, S1, S2 normal, no murmur, click, rub or gallop GI: soft, non-tender; bowel sounds normal; no masses,  no organomegaly Extremities: extremities normal, atraumatic, no cyanosis or edema Neurologic: Alert and oriented X 3, normal strength and tone. Normal symmetric reflexes. Normal coordination and gait  ECOG PERFORMANCE STATUS: 1 - Symptomatic but completely ambulatory  Blood pressure 158/75, pulse 75, temperature 99.7 F (37.6 C), temperature source Oral, resp. rate 18, height '5\' 3"'  (1.6 m), weight 223 lb 14.4 oz (101.56 kg), SpO2 100.00%.  LABORATORY DATA: Lab Results  Component Value Date   WBC 3.6* 11/25/2013   HGB 9.0* 11/25/2013   HCT 28.4* 11/25/2013   MCV 95.3 11/25/2013   PLT 212 11/25/2013      Chemistry      Component Value Date/Time   NA 142 11/25/2013 0807   NA 136 03/16/2013 1930   K 3.4* 11/25/2013 0807   K 3.8 03/16/2013 1930   CL 112* 05/10/2013 0849    CL 110 03/16/2013 1930   CO2 23 11/25/2013 0807   CO2 16* 03/16/2013 1930   BUN 7.1 11/25/2013 0807   BUN 22 03/16/2013 1930   CREATININE 0.7 11/25/2013 0807   CREATININE 1.24 03/16/2013 1930      Component Value Date/Time   CALCIUM 8.0* 11/25/2013 0807   CALCIUM 8.2* 03/16/2013 1930   ALKPHOS 72 11/25/2013 0807   ALKPHOS 31* 03/16/2013 1930   AST 9 11/25/2013 0807   AST 11 03/16/2013 1930   ALT 11 11/25/2013 0807   ALT 15 03/16/2013 1930   BILITOT 1.59* 11/25/2013 0807   BILITOT 0.2* 03/16/2013 1930       ASSESSMENT AND PLAN: The patient is a very pleasant 53 years old African American male with recurrent multiple myeloma currently on treatment with Carfilzomib 56 mg/M2, Pomalyst 3 mg by mouth daily for 21 days every 3 weeks in addition to Decadron 40 mg on a weekly basis. The patient has flulike symptoms with chest congestion and low-grade fever recently. I will start him on Z-Pak today. He was also given prescription for Tussionex 5 mL by mouth every 12 hours as needed for cough.  he would come back for follow up visit next week for evaluation before starting cycle #3 of his chemotherapy after repeating myeloma panel. He was advised to call immediately if he has any concerning symptoms in the interval.  He was advised to call immediately if she has any concerning symptoms in the interval. The patient voices understanding of current disease status and treatment options and is in agreement with the current care plan.  All questions were answered. The patient knows to call the clinic with any problems, questions or concerns. We can certainly see the patient much sooner if necessary.  I spent 15 minutes counseling the patient face to face. The total time spent in the appointment was 25 minutes.  Disclaimer: This note was dictated with voice recognition software. Similar sounding words can inadvertently be transcribed and may not be corrected upon review.  Eilleen Kempf., MD 11/25/2013

## 2013-11-25 NOTE — Progress Notes (Signed)
  Echocardiogram 2D Echocardiogram has been performed.  Diamond Nickel 11/25/2013, 10:39 AM

## 2013-11-27 NOTE — Patient Instructions (Signed)
Follow up visit next week

## 2013-11-29 ENCOUNTER — Telehealth: Payer: Self-pay | Admitting: *Deleted

## 2013-11-29 ENCOUNTER — Ambulatory Visit (HOSPITAL_BASED_OUTPATIENT_CLINIC_OR_DEPARTMENT_OTHER): Payer: 59

## 2013-11-29 ENCOUNTER — Ambulatory Visit (HOSPITAL_BASED_OUTPATIENT_CLINIC_OR_DEPARTMENT_OTHER): Payer: 59 | Admitting: Physician Assistant

## 2013-11-29 ENCOUNTER — Telehealth: Payer: Self-pay | Admitting: Internal Medicine

## 2013-11-29 ENCOUNTER — Other Ambulatory Visit: Payer: 59

## 2013-11-29 ENCOUNTER — Ambulatory Visit: Payer: 59 | Admitting: Pharmacist

## 2013-11-29 ENCOUNTER — Other Ambulatory Visit: Payer: Self-pay | Admitting: Physician Assistant

## 2013-11-29 ENCOUNTER — Encounter: Payer: Self-pay | Admitting: Physician Assistant

## 2013-11-29 ENCOUNTER — Ambulatory Visit: Payer: 59

## 2013-11-29 VITALS — BP 160/80 | HR 75 | Temp 98.0°F | Resp 18 | Ht 63.0 in | Wt 216.9 lb

## 2013-11-29 DIAGNOSIS — C9002 Multiple myeloma in relapse: Secondary | ICD-10-CM

## 2013-11-29 DIAGNOSIS — C9000 Multiple myeloma not having achieved remission: Secondary | ICD-10-CM

## 2013-11-29 DIAGNOSIS — I776 Arteritis, unspecified: Secondary | ICD-10-CM

## 2013-11-29 DIAGNOSIS — Z5112 Encounter for antineoplastic immunotherapy: Secondary | ICD-10-CM

## 2013-11-29 LAB — PROTIME-INR
INR: 1.8 — AB (ref 2.00–3.50)
Protime: 21.6 Seconds — ABNORMAL HIGH (ref 10.6–13.4)

## 2013-11-29 LAB — KAPPA/LAMBDA LIGHT CHAINS
KAPPA FREE LGHT CHN: 7.32 mg/dL — AB (ref 0.33–1.94)
Kappa:Lambda Ratio: 7.32 — ABNORMAL HIGH (ref 0.26–1.65)
LAMBDA FREE LGHT CHN: 1 mg/dL (ref 0.57–2.63)

## 2013-11-29 LAB — COMPREHENSIVE METABOLIC PANEL (CC13)
ALBUMIN: 3.5 g/dL (ref 3.5–5.0)
ALK PHOS: 76 U/L (ref 40–150)
ALT: 15 U/L (ref 0–55)
AST: 16 U/L (ref 5–34)
Anion Gap: 11 mEq/L (ref 3–11)
BUN: 6.2 mg/dL — AB (ref 7.0–26.0)
CO2: 21 mEq/L — ABNORMAL LOW (ref 22–29)
Calcium: 8.3 mg/dL — ABNORMAL LOW (ref 8.4–10.4)
Chloride: 111 mEq/L — ABNORMAL HIGH (ref 98–109)
Creatinine: 0.8 mg/dL (ref 0.7–1.3)
Glucose: 82 mg/dl (ref 70–140)
POTASSIUM: 3.6 meq/L (ref 3.5–5.1)
SODIUM: 143 meq/L (ref 136–145)
Total Bilirubin: 0.94 mg/dL (ref 0.20–1.20)
Total Protein: 7 g/dL (ref 6.4–8.3)

## 2013-11-29 LAB — SPEP & IFE WITH QIG
ALBUMIN ELP: 57.6 % (ref 55.8–66.1)
ALPHA-1-GLOBULIN: 7.7 % — AB (ref 2.9–4.9)
Alpha-2-Globulin: 13 % — ABNORMAL HIGH (ref 7.1–11.8)
BETA 2: 3.1 % — AB (ref 3.2–6.5)
Beta Globulin: 5.8 % (ref 4.7–7.2)
Gamma Globulin: 12.8 % (ref 11.1–18.8)
IgA: 76 mg/dL (ref 68–379)
IgG (Immunoglobin G), Serum: 843 mg/dL (ref 650–1600)
IgM, Serum: 19 mg/dL — ABNORMAL LOW (ref 41–251)
M-Spike, %: 0.64 g/dL
Total Protein, Serum Electrophoresis: 6.3 g/dL (ref 6.0–8.3)

## 2013-11-29 LAB — CBC WITH DIFFERENTIAL/PLATELET
BASO%: 5.3 % — AB (ref 0.0–2.0)
Basophils Absolute: 0.2 10*3/uL — ABNORMAL HIGH (ref 0.0–0.1)
EOS%: 4.9 % (ref 0.0–7.0)
Eosinophils Absolute: 0.2 10*3/uL (ref 0.0–0.5)
HCT: 31 % — ABNORMAL LOW (ref 38.4–49.9)
HGB: 10 g/dL — ABNORMAL LOW (ref 13.0–17.1)
LYMPH%: 26.2 % (ref 14.0–49.0)
MCH: 30 pg (ref 27.2–33.4)
MCHC: 32.3 g/dL (ref 32.0–36.0)
MCV: 93.1 fL (ref 79.3–98.0)
MONO#: 0.7 10*3/uL (ref 0.1–0.9)
MONO%: 15.3 % — ABNORMAL HIGH (ref 0.0–14.0)
NEUT#: 2.1 10*3/uL (ref 1.5–6.5)
NEUT%: 48.3 % (ref 39.0–75.0)
Platelets: 297 10*3/uL (ref 140–400)
RBC: 3.33 10*6/uL — ABNORMAL LOW (ref 4.20–5.82)
RDW: 22.1 % — AB (ref 11.0–14.6)
WBC: 4.3 10*3/uL (ref 4.0–10.3)
lymph#: 1.1 10*3/uL (ref 0.9–3.3)
nRBC: 1 % — ABNORMAL HIGH (ref 0–0)

## 2013-11-29 LAB — BETA 2 MICROGLOBULIN, SERUM: BETA 2 MICROGLOBULIN: 2.58 mg/L — AB (ref 1.01–1.73)

## 2013-11-29 LAB — TECHNOLOGIST REVIEW

## 2013-11-29 LAB — POCT INR: INR: 1.8

## 2013-11-29 MED ORDER — HEPARIN SOD (PORK) LOCK FLUSH 100 UNIT/ML IV SOLN
500.0000 [IU] | Freq: Once | INTRAVENOUS | Status: AC | PRN
  Administered 2013-11-29: 500 [IU]
  Filled 2013-11-29: qty 5

## 2013-11-29 MED ORDER — SODIUM CHLORIDE 0.9 % IV SOLN
Freq: Once | INTRAVENOUS | Status: AC
  Administered 2013-11-29: 13:00:00 via INTRAVENOUS

## 2013-11-29 MED ORDER — DEXAMETHASONE SODIUM PHOSPHATE 10 MG/ML IJ SOLN
INTRAMUSCULAR | Status: AC
  Filled 2013-11-29: qty 1

## 2013-11-29 MED ORDER — ONDANSETRON 8 MG/NS 50 ML IVPB
INTRAVENOUS | Status: AC
  Filled 2013-11-29: qty 8

## 2013-11-29 MED ORDER — WARFARIN SODIUM 5 MG PO TABS: 5.0000 mg | ORAL_TABLET | Freq: Every day | ORAL | Status: DC

## 2013-11-29 MED ORDER — ONDANSETRON 8 MG/50ML IVPB (CHCC)
8.0000 mg | Freq: Once | INTRAVENOUS | Status: AC
  Administered 2013-11-29: 8 mg via INTRAVENOUS

## 2013-11-29 MED ORDER — DEXAMETHASONE SODIUM PHOSPHATE 10 MG/ML IJ SOLN
10.0000 mg | Freq: Once | INTRAMUSCULAR | Status: AC
  Administered 2013-11-29: 10 mg via INTRAVENOUS

## 2013-11-29 MED ORDER — SODIUM CHLORIDE 0.9 % IJ SOLN
10.0000 mL | INTRAMUSCULAR | Status: DC | PRN
  Administered 2013-11-29: 10 mL
  Filled 2013-11-29: qty 10

## 2013-11-29 MED ORDER — DEXTROSE 5 % IV SOLN
56.0000 mg/m2 | Freq: Once | INTRAVENOUS | Status: AC
  Administered 2013-11-29: 118 mg via INTRAVENOUS
  Filled 2013-11-29: qty 59

## 2013-11-29 NOTE — Telephone Encounter (Signed)
Per staff message and POF I have scheduled appts.  [name]  

## 2013-11-29 NOTE — Telephone Encounter (Signed)
Gave pt appt for lab,md and chemo for january and February 2015 °

## 2013-11-29 NOTE — Progress Notes (Addendum)
[place] [phone]   Fax:(336) [phone]  SHARED VISIT PROGRESS NOTE  [name]., MD [address]  [place] [address]  Principle Diagnosis:  #1 recurrent multiple myeloma IgG kappa subtype diagnosed in June of 2008  #2 history of vasculitis and thrombosis of skin lesions   Prior Therapy:  #1 status post palliative radiotherapy to the left hip under the care of Dr. Sondra Come  #2 status post 5 cycles of systemic chemotherapy with Revlimid and low dose Decadron. Last dose given June 2009 with good response.  #3 status post autologous peripheral blood stem cell transplant at [facility] on 02/02/2008.  #4 the patient had evidence for disease recurrence in December 2010.  #5 Revlimid 25 mg by mouth daily for 21 days every 4 weeks in addition to Decadron 40 mg orally on a weekly basis. The patient is status post 28 cycles, discontinued today secondary to disease progression.  #6 Systemic chemotherapy with Velcade 1.3 mg/M2 on days 1, 4, 8 and 11 in addition to Doxil 30 mg/M2 on day 4 and Decadron 40 mg by mouth on weekly basis every 3 weeks. Status post 3 cycles, last dose was given 05/04/2012 discontinued secondary to intolerance.  #7 Salvage therapy treatment with the Pomalyst 4 mg by mouth daily for 21 days every 28 days as well as Cytoxan 50 mg by mouth every other day and dexamethasone 40 mg by mouth once weekly. Therapy beginning 09/25/2012, status post 2 cycles discontinued secondary to disease progression and intolerance.  #8 Kyprolis (Carfilzomib) 27 mg/M2 on days 1, 2, 8, 9, 15 and 16 every 4 weeks. First dose on 02/08/2013. This would be concurrent with the dexamethasone 40 mg by mouth on a weekly basis. Status post 3 cycles with disease progression.  #9 Systemic chemotherapy with Carfilzomib 27 mg/M2 days 1,2 , 8, 9, 15 and 16, Cytoxan 300 mg/M2 and dexamethasone 40 mg by mouth weekly every 4 weeks. Status post 4 cycles.  Last dose was given 08/09/2013 discontinued today secondary to disease progression.  Current therapy:  1) Systemic chemotherapy with Carfilzomib 56 mg/M2 days 1,2 , 8, 9, 15 and 16, Pomalyst 3 mg by mouth daily for 21 days every 4 weeks and dexamethasone 40 mg by mouth weekly every 4 weeks. First cycle started on 10/05/2013. Status post 2 cycles. The patient missed day 8 and 9 of the first cycle secondary to thrombocytopenia.  2) Zometa 4 mg IV given every 3 months.    INTERVAL HISTORY: [name] 53 y.o. male returns to the clinic today for followup visit accompanied by his wife. He reports that he feels significantly better. He completed his Z-Pak this morning. He denied any fever or chills. The Tussionex cough syrup has significantly improved his cough symptoms. He denies any fever, chills, diarrhea or constipation. He requests a refill for his warfarin tablets. He states that there is another medication he needs a refill for but he forgets which one it is. He will have his pharmacy notify us. He recently had repeat protein studies done to reevaluate his disease and he presents to discuss the results of those studies. His lower extremity edema has completely resolved. He denied having any significant complaints today. He has no significant fatigue or weakness. He has no chest pain, shortness of breath, cough or hemoptysis. The patient denied having any nausea or vomiting. He has no weight loss or night sweats.   MEDICAL HISTORY: Past Medical  History  Diagnosis Date  . Multiple myeloma 09/24/2011  . Hypertension     ALLERGIES:  is allergic to red dye and other.  MEDICATIONS:  Current Outpatient Prescriptions  Medication Sig Dispense Refill  . amLODipine (NORVASC) 10 MG tablet Take 10 mg by mouth daily.      . chlorpheniramine-HYDROcodone (TUSSIONEX) 10-8 MG/5ML LQCR Take 5 mLs by mouth every 12 (twelve) hours as needed for cough.  115 mL  0  . dexamethasone (DECADRON) 4 MG tablet Take  40 mg by mouth once a week. Mondays with chemo      . furosemide (LASIX) 20 MG tablet Take 1 tablet (20 mg total) by mouth daily.  30 tablet  0  . HYDROcodone-acetaminophen (NORCO) 10-325 MG per tablet Take 1-2 tablets by mouth every 6 (six) hours as needed. For pain.  40 tablet  0  . morphine (MS CONTIN) 30 MG 12 hr tablet Take 30 mg by mouth every 12 (twelve) hours as needed for pain.      . Nebivolol HCl (BYSTOLIC) 20 MG TABS Take 30 mg by mouth daily.      [name] omeprazole (PRILOSEC) 40 MG capsule Take 40 mg by mouth daily as needed (for heartburn).       [name] oxyCODONE-acetaminophen (PERCOCET/ROXICET) 5-325 MG per tablet Take 1 tablet by mouth every 6 (six) hours as needed for moderate pain.      . pomalidomide (POMALYST) 3 MG capsule Take 3 mg by mouth daily. Take with water on days 1-21. Repeat every 28 days.      . potassium chloride SA (K-DUR,KLOR-CON) 20 MEQ tablet TAKE 1 TABLET BY MOUTH EVERY DAY  30 tablet  0  . pregabalin (LYRICA) 50 MG capsule Take 1 capsule (50 mg total) by mouth 3 (three) times daily.  270 capsule  1  . PRESCRIPTION MEDICATION Receives chemo at [facility] weekly oncologist is Dr. Earlie Server      . prochlorperazine (COMPAZINE) 10 MG tablet Take 10 mg by mouth every 6 (six) hours as needed for nausea.      . benzonatate (TESSALON) 200 MG capsule Take 200 mg by mouth 3 (three) times daily as needed for cough.       . warfarin (COUMADIN) 5 MG tablet Take 1-1.5 tablets (5-7.5 mg total) by mouth daily. Take 5 mg by mouth daily except 7.5 mg on Mondays  50 tablet  1   No current facility-administered medications for this visit.   Facility-Administered Medications Ordered in Other Visits  Medication Dose Route Frequency Provider Last Rate Last Dose  . sodium chloride 0.9 % injection 10 mL  10 mL Intracatheter PRN Curt Bears, MD   10 mL at 07/27/13 1617  . sodium chloride 0.9 % injection 10 mL  10 mL Intracatheter PRN Curt Bears, MD   10 mL at 11/29/13 1446    SURGICAL  HISTORY:  Past Surgical History  Procedure Laterality Date  . Video bronchoscopy  12/11/2012    Procedure: VIDEO BRONCHOSCOPY;  Surgeon: Ivin Poot, MD;  Location: [facility];  Service: Thoracic;  Laterality: N/A;  . Video assisted thoracoscopy  12/11/2012    Procedure: VIDEO ASSISTED THORACOSCOPY;  Surgeon: Ivin Poot, MD;  Location: [facility];  Service: Thoracic;  Laterality: Right;  . Decortication  12/11/2012    Procedure: DECORTICATION;  Surgeon: Ivin Poot, MD;  Location: [facility] OR;  Service: Thoracic;  Laterality: N/A;    REVIEW OF SYSTEMS:  Constitutional: negative Eyes: negative Ears, nose, mouth, throat, and  face: negative Respiratory: negative Cardiovascular: negative Gastrointestinal: negative Genitourinary:negative Integument/breast: negative Hematologic/lymphatic: negative Musculoskeletal:positive for bone pain and Right lateral rib cage pain Neurological: negative Behavioral/Psych: negative Endocrine: negative Allergic/Immunologic: negative   PHYSICAL EXAMINATION: General appearance: alert, cooperative, fatigued and no distress Head: Normocephalic, without obvious abnormality, atraumatic Neck: no adenopathy, no JVD, supple, symmetrical, trachea midline and thyroid not enlarged, symmetric, no tenderness/mass/nodules Lymph nodes: Cervical, supraclavicular, and axillary nodes normal. Resp: clear to auscultation bilaterally Back: symmetric, no curvature. ROM normal. No CVA tenderness. Cardio: regular rate and rhythm, S1, S2 normal, no murmur, click, rub or gallop GI: soft, non-tender; bowel sounds normal; no masses,  no organomegaly Extremities: extremities normal, atraumatic, no cyanosis or edema Neurologic: Alert and oriented X 3, normal strength and tone. Normal symmetric reflexes. Normal coordination and gait  ECOG PERFORMANCE STATUS: 1 - Symptomatic but completely ambulatory  Blood pressure 160/80, pulse 75, temperature 98 F (36.7 C), temperature source Oral,  resp. rate 18, height '5\' 3"'  (1.6 m), weight 216 lb 14.4 oz (98.385 kg).  LABORATORY DATA: Lab Results  Component Value Date   WBC 4.3 11/29/2013   HGB 10.0* 11/29/2013   HCT 31.0* 11/29/2013   MCV 93.1 11/29/2013   PLT 297 11/29/2013      Chemistry      Component Value Date/Time   NA 143 11/29/2013 1050   NA 136 03/16/2013 1930   K 3.6 11/29/2013 1050   K 3.8 03/16/2013 1930   CL 112* 05/10/2013 0849   CL 110 03/16/2013 1930   CO2 21* 11/29/2013 1050   CO2 16* 03/16/2013 1930   BUN 6.2* 11/29/2013 1050   BUN 22 03/16/2013 1930   CREATININE 0.8 11/29/2013 1050   CREATININE 1.24 03/16/2013 1930      Component Value Date/Time   CALCIUM 8.3* 11/29/2013 1050   CALCIUM 8.2* 03/16/2013 1930   ALKPHOS 76 11/29/2013 1050   ALKPHOS 31* 03/16/2013 1930   AST 16 11/29/2013 1050   AST 11 03/16/2013 1930   ALT 15 11/29/2013 1050   ALT 15 03/16/2013 1930   BILITOT 0.94 11/29/2013 1050   BILITOT 0.2* 03/16/2013 1930     Labs from 11/25/2013 are as follows: Beta-2 microglobin 2.58, previously 5.77 on 08/30/2013, kappa free light chain 7.32, previously 156 on 08/30/2013, lambda free light chain 1.00 previously 0.27,: Lambda ratio 7.32 previously 577.78, IgG 843 previously 4940, IgA 76, previously less than 7, IgM 19, previously less than 4, total protein, serum 6.3 previously 11.3 on 01/06/2013.   ASSESSMENT AND PLAN: The patient is a very pleasant 53 years old African American male with recurrent multiple myeloma currently on treatment with Carfilzomib 56 mg/M2, Pomalyst 3 mg by mouth daily for 21 days every 3 weeks in addition to Decadron 40 mg on a weekly basis. He is status post 2 cycles. The patient's protein studies show significant improvement in his disease. Patient was discussed with also seen by Dr. Julien Nordmann who reviewed the protein study results with the patient and his wife. He will continue on his current treatment regimen and proceed with day 1 cycle #3 today as scheduled. He'll followup in 2 weeks for  another symptom management visit. A refill of his warfarin was sent to his pharmacy of record via E. scribed.  He was advised to call immediately if he has any concerning symptoms in the interval.  He was advised to call immediately if she has any concerning symptoms in the interval. The patient voices understanding of current disease status and treatment  options and is in agreement with the current care plan.  All questions were answered. The patient knows to call the clinic with any problems, questions or concerns. We can certainly see the patient much sooner if necessary.    Disclaimer: This note was dictated with voice recognition software. Similar sounding words can inadvertently be transcribed and may not be corrected upon review.  Carlton Adam, PA-C 11/29/2013  ADDENDUM: Hematology/Oncology Attending: I had the face to face encounter with the patient. I recommended his care plan. This is a very pleasant 53 years old African American male with recurrent multiple myeloma currently on high-dose Carfilzomib, Pomalyst and Decadron status post 2 cycles and he is tolerating his treatment fairly well with no significant adverse effects. He has significant improvement in his myeloma panel today. I discussed the lab result with the patient and his wife. I recommended for him to continue with the same treatment regimen. He will start cycle #3 today. The patient would come back for followup visit in 2 weeks for reevaluation and management any adverse effect of his treatment. He was advised to call immediately if he has any concerning symptoms in the interval.  Disclaimer: This note was dictated with voice recognition software. Similar sounding words can inadvertently be transcribed and may not be corrected upon review. Eilleen Kempf., MD 12/01/2013

## 2013-11-29 NOTE — Progress Notes (Signed)
INR = 1.8    Goal 2-3 INR just below goal range. Saw patient in the infusion area.   No bleeding/bruising noted. No medication/dietary changes. He will continue taking 7.5 mg on Monday, 5 mg all other days. We will see him next Monday, 12/06/13 during his infusion appt. He has lab at 8:30, infusion at [place], PharmD

## 2013-11-29 NOTE — Patient Instructions (Signed)
Continue with lab and chemotherapy as scheduled Continue followup with the Coumadin clinic as scheduled Followup in 2 weeks

## 2013-11-29 NOTE — Patient Instructions (Signed)
[facility] Discharge Instructions for Patients Receiving Chemotherapy  Today you received the following chemotherapy agent Kyprolis.  To help prevent nausea and vomiting after your treatment, we encourage you to take your nausea medication.   If you develop nausea and vomiting that is not controlled by your nausea medication, call the clinic.   BELOW ARE SYMPTOMS THAT SHOULD BE REPORTED IMMEDIATELY:  *FEVER GREATER THAN 100.5 F  *CHILLS WITH OR WITHOUT FEVER  NAUSEA AND VOMITING THAT IS NOT CONTROLLED WITH YOUR NAUSEA MEDICATION  *UNUSUAL SHORTNESS OF BREATH  *UNUSUAL BRUISING OR BLEEDING  TENDERNESS IN MOUTH AND THROAT WITH OR WITHOUT PRESENCE OF ULCERS  *URINARY PROBLEMS  *BOWEL PROBLEMS  UNUSUAL RASH Items with * indicate a potential emergency and should be followed up as soon as possible.  Feel free to call the clinic you have any questions or concerns. The clinic phone number is [phone].    

## 2013-11-30 ENCOUNTER — Other Ambulatory Visit: Payer: Self-pay | Admitting: Internal Medicine

## 2013-11-30 ENCOUNTER — Ambulatory Visit (HOSPITAL_BASED_OUTPATIENT_CLINIC_OR_DEPARTMENT_OTHER): Payer: 59

## 2013-11-30 VITALS — BP 139/79 | HR 88 | Temp 97.7°F | Resp 16

## 2013-11-30 DIAGNOSIS — Z5112 Encounter for antineoplastic immunotherapy: Secondary | ICD-10-CM

## 2013-11-30 DIAGNOSIS — C9002 Multiple myeloma in relapse: Secondary | ICD-10-CM

## 2013-11-30 DIAGNOSIS — C9000 Multiple myeloma not having achieved remission: Secondary | ICD-10-CM

## 2013-11-30 MED ORDER — SODIUM CHLORIDE 0.9 % IJ SOLN
10.0000 mL | INTRAMUSCULAR | Status: DC | PRN
  Administered 2013-11-30: 10 mL
  Filled 2013-11-30: qty 10

## 2013-11-30 MED ORDER — HEPARIN SOD (PORK) LOCK FLUSH 100 UNIT/ML IV SOLN
500.0000 [IU] | Freq: Once | INTRAVENOUS | Status: AC | PRN
  Administered 2013-11-30: 500 [IU]
  Filled 2013-11-30: qty 5

## 2013-11-30 MED ORDER — DEXTROSE 5 % IV SOLN
56.0000 mg/m2 | Freq: Once | INTRAVENOUS | Status: AC
  Administered 2013-11-30: 118 mg via INTRAVENOUS
  Filled 2013-11-30: qty 59

## 2013-11-30 MED ORDER — ONDANSETRON 8 MG/50ML IVPB (CHCC)
8.0000 mg | Freq: Once | INTRAVENOUS | Status: AC
  Administered 2013-11-30: 8 mg via INTRAVENOUS

## 2013-11-30 MED ORDER — DEXAMETHASONE SODIUM PHOSPHATE 10 MG/ML IJ SOLN
10.0000 mg | Freq: Once | INTRAMUSCULAR | Status: AC
  Administered 2013-11-30: 10 mg via INTRAVENOUS

## 2013-11-30 MED ORDER — DEXAMETHASONE SODIUM PHOSPHATE 10 MG/ML IJ SOLN
INTRAMUSCULAR | Status: AC
  Filled 2013-11-30: qty 1

## 2013-11-30 MED ORDER — ZOLEDRONIC ACID 4 MG/100ML IV SOLN
4.0000 mg | Freq: Once | INTRAVENOUS | Status: AC
  Administered 2013-11-30: 4 mg via INTRAVENOUS
  Filled 2013-11-30: qty 100

## 2013-11-30 MED ORDER — SODIUM CHLORIDE 0.9 % IV SOLN
Freq: Once | INTRAVENOUS | Status: AC
  Administered 2013-11-30: 08:00:00 via INTRAVENOUS

## 2013-11-30 MED ORDER — ONDANSETRON 8 MG/NS 50 ML IVPB
INTRAVENOUS | Status: AC
  Filled 2013-11-30: qty 8

## 2013-11-30 NOTE — Patient Instructions (Signed)
[facility] Discharge Instructions for Patients Receiving Chemotherapy  Today you received the following chemotherapy agent Kyprolis.  To help prevent nausea and vomiting after your treatment, we encourage you to take your nausea medication.   If you develop nausea and vomiting that is not controlled by your nausea medication, call the clinic.   BELOW ARE SYMPTOMS THAT SHOULD BE REPORTED IMMEDIATELY:  *FEVER GREATER THAN 100.5 F  *CHILLS WITH OR WITHOUT FEVER  NAUSEA AND VOMITING THAT IS NOT CONTROLLED WITH YOUR NAUSEA MEDICATION  *UNUSUAL SHORTNESS OF BREATH  *UNUSUAL BRUISING OR BLEEDING  TENDERNESS IN MOUTH AND THROAT WITH OR WITHOUT PRESENCE OF ULCERS  *URINARY PROBLEMS  *BOWEL PROBLEMS  UNUSUAL RASH Items with * indicate a potential emergency and should be followed up as soon as possible.  Feel free to call the clinic you have any questions or concerns. The clinic phone number is [phone].    

## 2013-12-01 ENCOUNTER — Other Ambulatory Visit: Payer: Self-pay | Admitting: *Deleted

## 2013-12-01 DIAGNOSIS — C9000 Multiple myeloma not having achieved remission: Secondary | ICD-10-CM

## 2013-12-01 MED ORDER — POMALIDOMIDE 3 MG PO CAPS: 3.0000 mg | ORAL_CAPSULE | Freq: Every day | ORAL | Status: DC

## 2013-12-06 ENCOUNTER — Ambulatory Visit (HOSPITAL_BASED_OUTPATIENT_CLINIC_OR_DEPARTMENT_OTHER): Payer: 59

## 2013-12-06 ENCOUNTER — Other Ambulatory Visit (HOSPITAL_BASED_OUTPATIENT_CLINIC_OR_DEPARTMENT_OTHER): Payer: 59

## 2013-12-06 ENCOUNTER — Ambulatory Visit (HOSPITAL_BASED_OUTPATIENT_CLINIC_OR_DEPARTMENT_OTHER): Payer: 59 | Admitting: Pharmacist

## 2013-12-06 VITALS — BP 143/66 | HR 67 | Temp 98.1°F | Resp 16

## 2013-12-06 DIAGNOSIS — I776 Arteritis, unspecified: Secondary | ICD-10-CM

## 2013-12-06 DIAGNOSIS — Z5112 Encounter for antineoplastic immunotherapy: Secondary | ICD-10-CM

## 2013-12-06 DIAGNOSIS — C9002 Multiple myeloma in relapse: Secondary | ICD-10-CM

## 2013-12-06 DIAGNOSIS — C9000 Multiple myeloma not having achieved remission: Secondary | ICD-10-CM

## 2013-12-06 LAB — CBC WITH DIFFERENTIAL/PLATELET
BASO%: 0.5 % (ref 0.0–2.0)
Basophils Absolute: 0 10*3/uL (ref 0.0–0.1)
EOS%: 2.6 % (ref 0.0–7.0)
Eosinophils Absolute: 0.2 10*3/uL (ref 0.0–0.5)
HCT: 30.4 % — ABNORMAL LOW (ref 38.4–49.9)
HGB: 9.7 g/dL — ABNORMAL LOW (ref 13.0–17.1)
LYMPH%: 11.6 % — AB (ref 14.0–49.0)
MCH: 30.3 pg (ref 27.2–33.4)
MCHC: 31.9 g/dL — AB (ref 32.0–36.0)
MCV: 95 fL (ref 79.3–98.0)
MONO#: 0.6 10*3/uL (ref 0.1–0.9)
MONO%: 9.9 % (ref 0.0–14.0)
NEUT%: 75.4 % — ABNORMAL HIGH (ref 39.0–75.0)
NEUTROS ABS: 4.6 10*3/uL (ref 1.5–6.5)
Platelets: 102 10*3/uL — ABNORMAL LOW (ref 140–400)
RBC: 3.2 10*6/uL — AB (ref 4.20–5.82)
RDW: 23.2 % — ABNORMAL HIGH (ref 11.0–14.6)
WBC: 6.1 10*3/uL (ref 4.0–10.3)
lymph#: 0.7 10*3/uL — ABNORMAL LOW (ref 0.9–3.3)
nRBC: 2 % — ABNORMAL HIGH (ref 0–0)

## 2013-12-06 LAB — COMPREHENSIVE METABOLIC PANEL (CC13)
ALT: 10 U/L (ref 0–55)
AST: 9 U/L (ref 5–34)
Albumin: 3.4 g/dL — ABNORMAL LOW (ref 3.5–5.0)
Alkaline Phosphatase: 66 U/L (ref 40–150)
Anion Gap: 10 mEq/L (ref 3–11)
BUN: 9.4 mg/dL (ref 7.0–26.0)
CO2: 23 mEq/L (ref 22–29)
CREATININE: 0.6 mg/dL — AB (ref 0.7–1.3)
Calcium: 7.9 mg/dL — ABNORMAL LOW (ref 8.4–10.4)
Chloride: 109 mEq/L (ref 98–109)
Glucose: 99 mg/dl (ref 70–140)
POTASSIUM: 3.5 meq/L (ref 3.5–5.1)
Sodium: 142 mEq/L (ref 136–145)
Total Bilirubin: 0.97 mg/dL (ref 0.20–1.20)
Total Protein: 6.1 g/dL — ABNORMAL LOW (ref 6.4–8.3)

## 2013-12-06 LAB — PROTIME-INR
INR: 2.4 (ref 2.00–3.50)
PROTIME: 28.8 s — AB (ref 10.6–13.4)

## 2013-12-06 LAB — POCT INR: INR: 2.4

## 2013-12-06 LAB — TECHNOLOGIST REVIEW

## 2013-12-06 MED ORDER — DEXAMETHASONE SODIUM PHOSPHATE 10 MG/ML IJ SOLN
10.0000 mg | Freq: Once | INTRAMUSCULAR | Status: AC
  Administered 2013-12-06: 10 mg via INTRAVENOUS

## 2013-12-06 MED ORDER — SODIUM CHLORIDE 0.9 % IV SOLN
Freq: Once | INTRAVENOUS | Status: AC
  Administered 2013-12-06: 09:00:00 via INTRAVENOUS

## 2013-12-06 MED ORDER — HEPARIN SOD (PORK) LOCK FLUSH 100 UNIT/ML IV SOLN
500.0000 [IU] | Freq: Once | INTRAVENOUS | Status: AC | PRN
  Administered 2013-12-06: 500 [IU]
  Filled 2013-12-06: qty 5

## 2013-12-06 MED ORDER — DEXAMETHASONE SODIUM PHOSPHATE 10 MG/ML IJ SOLN
INTRAMUSCULAR | Status: AC
  Filled 2013-12-06: qty 1

## 2013-12-06 MED ORDER — DEXTROSE 5 % IV SOLN
56.0000 mg/m2 | Freq: Once | INTRAVENOUS | Status: AC
  Administered 2013-12-06: 118 mg via INTRAVENOUS
  Filled 2013-12-06: qty 59

## 2013-12-06 MED ORDER — SODIUM CHLORIDE 0.9 % IJ SOLN
10.0000 mL | INTRAMUSCULAR | Status: DC | PRN
  Administered 2013-12-06: 10 mL
  Filled 2013-12-06: qty 10

## 2013-12-06 MED ORDER — ONDANSETRON 8 MG/50ML IVPB (CHCC)
8.0000 mg | Freq: Once | INTRAVENOUS | Status: AC
  Administered 2013-12-06: 8 mg via INTRAVENOUS

## 2013-12-06 MED ORDER — ONDANSETRON 8 MG/NS 50 ML IVPB
INTRAVENOUS | Status: AC
  Filled 2013-12-06: qty 8

## 2013-12-06 MED ORDER — SODIUM CHLORIDE 0.9 % IV SOLN
Freq: Once | INTRAVENOUS | Status: AC
  Administered 2013-12-06: 10:00:00 via INTRAVENOUS

## 2013-12-06 NOTE — Patient Instructions (Signed)
[facility] Discharge Instructions for Patients Receiving Chemotherapy  Today you received the following chemotherapy agents [place].   To help prevent nausea and vomiting after your treatment, we encourage you to take your nausea medication Compazine 10 mg every 6 hours as needed   If you develop nausea and vomiting that is not controlled by your nausea medication, call the clinic.   BELOW ARE SYMPTOMS THAT SHOULD BE REPORTED IMMEDIATELY:  *FEVER GREATER THAN 100.5 F  *CHILLS WITH OR WITHOUT FEVER  NAUSEA AND VOMITING THAT IS NOT CONTROLLED WITH YOUR NAUSEA MEDICATION  *UNUSUAL SHORTNESS OF BREATH  *UNUSUAL BRUISING OR BLEEDING  TENDERNESS IN MOUTH AND THROAT WITH OR WITHOUT PRESENCE OF ULCERS  *URINARY PROBLEMS  *BOWEL PROBLEMS  UNUSUAL RASH Items with * indicate a potential emergency and should be followed up as soon as possible.  Feel free to call the clinic you have any questions or concerns. The clinic phone number is [phone].

## 2013-12-06 NOTE — Progress Notes (Signed)
INR within goal today. Hg/Hct = 9.7/30.4, Pltc = 102 Pt doing well. No changes to report: no changes in diet or medications. No missed doses. No concerns regarding anticoagulation. Continue 5 mg daily except for 7.5 mg on Mondays. Recheck PT/INR with next scheduled lab/appointments on 12/13/13; lab at 8:45am, Dr. Julien Nordmann at 9:15am, Treatment at 10:15am and Coumadin clinic at 10:30am.   The pharmacist will see you in the infusion area.

## 2013-12-06 NOTE — Patient Instructions (Signed)
Continue 5 mg daily except for 7.5 mg on Mondays Recheck PT/INR with next scheduled lab/appointments on 12/13/13.   We will see you in the infusion area.

## 2013-12-07 ENCOUNTER — Ambulatory Visit (HOSPITAL_BASED_OUTPATIENT_CLINIC_OR_DEPARTMENT_OTHER): Payer: 59

## 2013-12-07 ENCOUNTER — Other Ambulatory Visit: Payer: Self-pay | Admitting: *Deleted

## 2013-12-07 VITALS — BP 147/76 | HR 58 | Temp 98.1°F

## 2013-12-07 DIAGNOSIS — C9002 Multiple myeloma in relapse: Secondary | ICD-10-CM

## 2013-12-07 DIAGNOSIS — Z5112 Encounter for antineoplastic immunotherapy: Secondary | ICD-10-CM

## 2013-12-07 DIAGNOSIS — C9000 Multiple myeloma not having achieved remission: Secondary | ICD-10-CM

## 2013-12-07 MED ORDER — SODIUM CHLORIDE 0.9 % IJ SOLN
10.0000 mL | INTRAMUSCULAR | Status: DC | PRN
Start: 2013-12-07 — End: 2013-12-07
  Administered 2013-12-07: 10 mL
  Filled 2013-12-07: qty 10

## 2013-12-07 MED ORDER — ONDANSETRON 8 MG/NS 50 ML IVPB
INTRAVENOUS | Status: AC
  Filled 2013-12-07: qty 8

## 2013-12-07 MED ORDER — LIDOCAINE-PRILOCAINE 2.5-2.5 % EX CREA: 1.0000 "application " | TOPICAL_CREAM | CUTANEOUS | Status: AC | PRN

## 2013-12-07 MED ORDER — ONDANSETRON 8 MG/50ML IVPB (CHCC)
8.0000 mg | Freq: Once | INTRAVENOUS | Status: AC
  Administered 2013-12-07: 8 mg via INTRAVENOUS

## 2013-12-07 MED ORDER — HEPARIN SOD (PORK) LOCK FLUSH 100 UNIT/ML IV SOLN
500.0000 [IU] | Freq: Once | INTRAVENOUS | Status: AC | PRN
  Administered 2013-12-07: 500 [IU]
  Filled 2013-12-07: qty 5

## 2013-12-07 MED ORDER — DEXAMETHASONE SODIUM PHOSPHATE 10 MG/ML IJ SOLN
INTRAMUSCULAR | Status: AC
  Filled 2013-12-07: qty 1

## 2013-12-07 MED ORDER — SODIUM CHLORIDE 0.9 % IV SOLN
Freq: Once | INTRAVENOUS | Status: AC
  Administered 2013-12-07: 09:00:00 via INTRAVENOUS

## 2013-12-07 MED ORDER — CARFILZOMIB CHEMO INJECTION 60 MG
56.0000 mg/m2 | Freq: Once | INTRAVENOUS | Status: AC
  Administered 2013-12-07: 118 mg via INTRAVENOUS
  Filled 2013-12-07: qty 59

## 2013-12-07 MED ORDER — DEXAMETHASONE SODIUM PHOSPHATE 10 MG/ML IJ SOLN
10.0000 mg | Freq: Once | INTRAMUSCULAR | Status: AC
  Administered 2013-12-07: 10 mg via INTRAVENOUS

## 2013-12-07 NOTE — Patient Instructions (Signed)
[facility] Discharge Instructions for Patients Receiving Chemotherapy  Today you received the following chemotherapy agents Kyprolis To help prevent nausea and vomiting after your treatment, we encourage you to take your nausea medication as prescribed.  If you develop nausea and vomiting that is not controlled by your nausea medication, call the clinic.   BELOW ARE SYMPTOMS THAT SHOULD BE REPORTED IMMEDIATELY:  *FEVER GREATER THAN 100.5 F  *CHILLS WITH OR WITHOUT FEVER  NAUSEA AND VOMITING THAT IS NOT CONTROLLED WITH YOUR NAUSEA MEDICATION  *UNUSUAL SHORTNESS OF BREATH  *UNUSUAL BRUISING OR BLEEDING  TENDERNESS IN MOUTH AND THROAT WITH OR WITHOUT PRESENCE OF ULCERS  *URINARY PROBLEMS  *BOWEL PROBLEMS  UNUSUAL RASH Items with * indicate a potential emergency and should be followed up as soon as possible.  Feel free to call the clinic you have any questions or concerns. The clinic phone number is [phone].    

## 2013-12-13 ENCOUNTER — Other Ambulatory Visit (HOSPITAL_BASED_OUTPATIENT_CLINIC_OR_DEPARTMENT_OTHER): Payer: 59

## 2013-12-13 ENCOUNTER — Ambulatory Visit (HOSPITAL_BASED_OUTPATIENT_CLINIC_OR_DEPARTMENT_OTHER): Payer: 59

## 2013-12-13 ENCOUNTER — Encounter: Payer: Self-pay | Admitting: Physician Assistant

## 2013-12-13 ENCOUNTER — Ambulatory Visit (HOSPITAL_COMMUNITY)
Admission: RE | Admit: 2013-12-13 | Discharge: 2013-12-13 | Disposition: A | Discharge status: Home / Self Care | Payer: 59 | Source: Ambulatory Visit | Attending: Internal Medicine | Admitting: Internal Medicine

## 2013-12-13 ENCOUNTER — Ambulatory Visit (HOSPITAL_BASED_OUTPATIENT_CLINIC_OR_DEPARTMENT_OTHER): Payer: 59 | Admitting: Physician Assistant

## 2013-12-13 ENCOUNTER — Ambulatory Visit: Payer: 59 | Admitting: Pharmacist

## 2013-12-13 ENCOUNTER — Telehealth: Payer: Self-pay | Admitting: Internal Medicine

## 2013-12-13 VITALS — BP 166/64 | HR 66 | Temp 98.0°F | Resp 20 | Ht 63.0 in | Wt 232.7 lb

## 2013-12-13 DIAGNOSIS — T451X5A Adverse effect of antineoplastic and immunosuppressive drugs, initial encounter: Secondary | ICD-10-CM | POA: Insufficient documentation

## 2013-12-13 DIAGNOSIS — D6481 Anemia due to antineoplastic chemotherapy: Secondary | ICD-10-CM

## 2013-12-13 DIAGNOSIS — C9002 Multiple myeloma in relapse: Secondary | ICD-10-CM

## 2013-12-13 DIAGNOSIS — C9000 Multiple myeloma not having achieved remission: Secondary | ICD-10-CM

## 2013-12-13 DIAGNOSIS — I776 Arteritis, unspecified: Secondary | ICD-10-CM

## 2013-12-13 DIAGNOSIS — Z5112 Encounter for antineoplastic immunotherapy: Secondary | ICD-10-CM

## 2013-12-13 LAB — CBC WITH DIFFERENTIAL/PLATELET
BASO%: 0.2 % (ref 0.0–2.0)
Basophils Absolute: 0 10*3/uL (ref 0.0–0.1)
EOS ABS: 0.4 10*3/uL (ref 0.0–0.5)
EOS%: 8.6 % — ABNORMAL HIGH (ref 0.0–7.0)
HEMATOCRIT: 26.2 % — AB (ref 38.4–49.9)
HGB: 8.1 g/dL — ABNORMAL LOW (ref 13.0–17.1)
LYMPH#: 0.6 10*3/uL — AB (ref 0.9–3.3)
LYMPH%: 12.1 % — AB (ref 14.0–49.0)
MCH: 30.8 pg (ref 27.2–33.4)
MCHC: 30.9 g/dL — AB (ref 32.0–36.0)
MCV: 99.6 fL — ABNORMAL HIGH (ref 79.3–98.0)
MONO#: 0.5 10*3/uL (ref 0.1–0.9)
MONO%: 10.8 % (ref 0.0–14.0)
NEUT#: 3.2 10*3/uL (ref 1.5–6.5)
NEUT%: 68.3 % (ref 39.0–75.0)
Platelets: 120 10*3/uL — ABNORMAL LOW (ref 140–400)
RBC: 2.63 10*6/uL — ABNORMAL LOW (ref 4.20–5.82)
RDW: 24.4 % — ABNORMAL HIGH (ref 11.0–14.6)
WBC: 4.6 10*3/uL (ref 4.0–10.3)
nRBC: 5 % — ABNORMAL HIGH (ref 0–0)

## 2013-12-13 LAB — COMPREHENSIVE METABOLIC PANEL (CC13)
ALT: 12 U/L (ref 0–55)
AST: 9 U/L (ref 5–34)
Albumin: 3.3 g/dL — ABNORMAL LOW (ref 3.5–5.0)
Alkaline Phosphatase: 65 U/L (ref 40–150)
Anion Gap: 7 mEq/L (ref 3–11)
BUN: 9.9 mg/dL (ref 7.0–26.0)
CALCIUM: 8.4 mg/dL (ref 8.4–10.4)
CHLORIDE: 109 meq/L (ref 98–109)
CO2: 24 meq/L (ref 22–29)
CREATININE: 0.7 mg/dL (ref 0.7–1.3)
Glucose: 83 mg/dl (ref 70–140)
Potassium: 4.2 mEq/L (ref 3.5–5.1)
Sodium: 140 mEq/L (ref 136–145)
Total Bilirubin: 1.07 mg/dL (ref 0.20–1.20)
Total Protein: 6 g/dL — ABNORMAL LOW (ref 6.4–8.3)

## 2013-12-13 LAB — POCT INR: INR: 1.7

## 2013-12-13 LAB — PROTIME-INR
INR: 1.7 — AB (ref 2.00–3.50)
Protime: 20.4 Seconds — ABNORMAL HIGH (ref 10.6–13.4)

## 2013-12-13 LAB — PREPARE RBC (CROSSMATCH)

## 2013-12-13 LAB — TECHNOLOGIST REVIEW

## 2013-12-13 MED ORDER — ONDANSETRON 8 MG/NS 50 ML IVPB
INTRAVENOUS | Status: AC
  Filled 2013-12-13: qty 8

## 2013-12-13 MED ORDER — DEXTROSE 5 % IV SOLN
56.0000 mg/m2 | Freq: Once | INTRAVENOUS | Status: AC
  Administered 2013-12-13: 118 mg via INTRAVENOUS
  Filled 2013-12-13: qty 59

## 2013-12-13 MED ORDER — SODIUM CHLORIDE 0.9 % IJ SOLN
10.0000 mL | INTRAMUSCULAR | Status: DC | PRN
  Administered 2013-12-13: 10 mL
  Filled 2013-12-13: qty 10

## 2013-12-13 MED ORDER — DEXAMETHASONE SODIUM PHOSPHATE 10 MG/ML IJ SOLN
INTRAMUSCULAR | Status: AC
  Filled 2013-12-13: qty 1

## 2013-12-13 MED ORDER — ONDANSETRON 8 MG/50ML IVPB (CHCC)
8.0000 mg | Freq: Once | INTRAVENOUS | Status: AC
  Administered 2013-12-13: 8 mg via INTRAVENOUS

## 2013-12-13 MED ORDER — SODIUM CHLORIDE 0.9 % IV SOLN
Freq: Once | INTRAVENOUS | Status: AC
  Administered 2013-12-13: 10:00:00 via INTRAVENOUS

## 2013-12-13 MED ORDER — HEPARIN SOD (PORK) LOCK FLUSH 100 UNIT/ML IV SOLN
500.0000 [IU] | Freq: Once | INTRAVENOUS | Status: AC | PRN
  Administered 2013-12-13: 500 [IU]
  Filled 2013-12-13: qty 5

## 2013-12-13 MED ORDER — DEXAMETHASONE SODIUM PHOSPHATE 10 MG/ML IJ SOLN
10.0000 mg | Freq: Once | INTRAMUSCULAR | Status: AC
  Administered 2013-12-13: 10 mg via INTRAVENOUS

## 2013-12-13 MED ORDER — FUROSEMIDE 20 MG PO TABS: 20.0000 mg | ORAL_TABLET | Freq: Every day | ORAL | Status: DC

## 2013-12-13 NOTE — Progress Notes (Signed)
Spoke with wife over phone as I missed seeing patient in infusion area. Directed them to take 10 mg tonight instead of 7.5 mg INR was 1.7 Pt did eat salad for dinner last night, but not convinced with would reflect in todays INR  Take 10 mg today instead of 7.5 mg.  Then continue 5 mg daily except for 7.5 mg on Mondays Recheck PT/INR with next scheduled lab/appointments on 01/03/14.  We will see you in the infusion area

## 2013-12-13 NOTE — Telephone Encounter (Signed)
gv adn printed appt sched and avs for pt for Feb....sed added tx.   °

## 2013-12-13 NOTE — Progress Notes (Addendum)
[facility] [phone]   [phone]  SHARED VISIT PROGRESS NOTE  [name]., MD [address] [place] [address]  Principle Diagnosis:  #1 recurrent multiple myeloma IgG kappa subtype diagnosed in June of 2008  #2 history of vasculitis and thrombosis of skin lesions   Prior Therapy:  #1 status post palliative radiotherapy to the left hip under the care of Dr. Sondra Come  #2 status post 5 cycles of systemic chemotherapy with Revlimid and low dose Decadron. Last dose given June 2009 with good response.  #3 status post autologous peripheral blood stem cell transplant at [facility] on 02/02/2008.  #4 the patient had evidence for disease recurrence in December 2010.  #5 Revlimid 25 mg by mouth daily for 21 days every 4 weeks in addition to Decadron 40 mg orally on a weekly basis. The patient is status post 28 cycles, discontinued today secondary to disease progression.  #6 Systemic chemotherapy with Velcade 1.3 mg/M2 on days 1, 4, 8 and 11 in addition to Doxil 30 mg/M2 on day 4 and Decadron 40 mg by mouth on weekly basis every 3 weeks. Status post 3 cycles, last dose was given 05/04/2012 discontinued secondary to intolerance.  #7 Salvage therapy treatment with the Pomalyst 4 mg by mouth daily for 21 days every 28 days as well as Cytoxan 50 mg by mouth every other day and dexamethasone 40 mg by mouth once weekly. Therapy beginning 09/25/2012, status post 2 cycles discontinued secondary to disease progression and intolerance.  #8 Kyprolis (Carfilzomib) 27 mg/M2 on days 1, 2, 8, 9, 15 and 16 every 4 weeks. First dose on 02/08/2013. This would be concurrent with the dexamethasone 40 mg by mouth on a weekly basis. Status post 3 cycles with disease progression.  #9 Systemic chemotherapy with Carfilzomib 27 mg/M2 days 1,2 , 8, 9, 15 and 16, Cytoxan 300 mg/M2 and dexamethasone 40 mg by mouth weekly every 4 weeks. Status post 4 cycles.  Last dose was given 08/09/2013 discontinued today secondary to disease progression.  Current therapy:  1) Systemic chemotherapy with Carfilzomib 56 mg/M2 days 1,2 , 8, 9, 15 and 16, Pomalyst 3 mg by mouth daily for 21 days every 4 weeks and dexamethasone 40 mg by mouth weekly every 4 weeks. First cycle started on 10/05/2013. Status post 2 cycles. The patient missed day 8 and 9 of the first cycle secondary to thrombocytopenia. So completed days 1, 2, 8 and 9 of cycle 3. 2) Zometa 4 mg IV given every 3 months.    INTERVAL HISTORY: [name] 53 y.o. male returns to the clinic today for followup visit accompanied by his wife. He reports feeling generally well today. He does note that his feet have begun to swell again. He continues to take his Lasix daily. He requests a refill for this medication as he will run out towards the end of this week. He is not drinking much water but is drinking a fair amount of soda, coffee and juices.  He denies any fever, chills, diarrhea or constipation.  He denied having any other significant complaints today. He has no significant fatigue or weakness. He has no chest pain, shortness of breath, cough or hemoptysis. The patient denied having any nausea or vomiting. He has no weight loss or night sweats. He states that he and his wife are planning to go out of town this weekend on a couple's retreat.  MEDICAL HISTORY: Past Medical  History  Diagnosis Date  . Multiple myeloma 09/24/2011  . Hypertension     ALLERGIES:  is allergic to red dye and other.  MEDICATIONS:  Current Outpatient Prescriptions  Medication Sig Dispense Refill  . amLODipine (NORVASC) 10 MG tablet Take 10 mg by mouth daily.      . chlorpheniramine-HYDROcodone (TUSSIONEX) 10-8 MG/5ML LQCR Take 5 mLs by mouth every 12 (twelve) hours as needed for cough.  115 mL  0  . dexamethasone (DECADRON) 4 MG tablet Take 40 mg by mouth once a week. Mondays with chemo      . furosemide (LASIX) 20 MG tablet  Take 1 tablet (20 mg total) by mouth daily.  30 tablet  0  . lidocaine-prilocaine (EMLA) cream Apply 1 application topically as needed.  30 g  0  . Nebivolol HCl (BYSTOLIC) 20 MG TABS Take 30 mg by mouth daily.      [name] omeprazole (PRILOSEC) 40 MG capsule Take 40 mg by mouth daily as needed (for heartburn).       . pomalidomide (POMALYST) 3 MG capsule Take 1 capsule (3 mg total) by mouth daily. Take with water on days 1-21. Repeat every 28 days.   AUTH # [id number] 12/01/13.  21 capsule  0  . potassium chloride SA (K-DUR,KLOR-CON) 20 MEQ tablet TAKE 1 TABLET BY MOUTH EVERY DAY  30 tablet  0  . pregabalin (LYRICA) 50 MG capsule Take 1 capsule (50 mg total) by mouth 3 (three) times daily.  270 capsule  1  . PRESCRIPTION MEDICATION Receives chemo at [facility] weekly oncologist is Dr. Earlie Server      . prochlorperazine (COMPAZINE) 10 MG tablet Take 10 mg by mouth every 6 (six) hours as needed for nausea.      [name] warfarin (COUMADIN) 5 MG tablet Take 1-1.5 tablets (5-7.5 mg total) by mouth daily. Take 5 mg by mouth daily except 7.5 mg on Mondays  50 tablet  1  . benzonatate (TESSALON) 200 MG capsule Take 200 mg by mouth 3 (three) times daily as needed for cough.       [name] HYDROcodone-acetaminophen (NORCO) 10-325 MG per tablet Take 1-2 tablets by mouth every 6 (six) hours as needed. For pain.  40 tablet  0  . morphine (MS CONTIN) 30 MG 12 hr tablet Take 30 mg by mouth every 12 (twelve) hours as needed for pain.      [name] oxyCODONE-acetaminophen (PERCOCET/ROXICET) 5-325 MG per tablet Take 1 tablet by mouth every 6 (six) hours as needed for moderate pain.       No current facility-administered medications for this visit.   Facility-Administered Medications Ordered in Other Visits  Medication Dose Route Frequency Provider Last Rate Last Dose  . heparin lock flush 100 unit/mL  500 Units Intracatheter Once PRN Curt Bears, MD      . sodium chloride 0.9 % injection 10 mL  10 mL Intracatheter PRN Curt Bears, MD   10 mL  at 07/27/13 1617  . sodium chloride 0.9 % injection 10 mL  10 mL Intracatheter PRN Curt Bears, MD        SURGICAL HISTORY:  Past Surgical History  Procedure Laterality Date  . Video bronchoscopy  12/11/2012    Procedure: VIDEO BRONCHOSCOPY;  Surgeon: Ivin Poot, MD;  Location: [place];  Service: Thoracic;  Laterality: N/A;  . Video assisted thoracoscopy  12/11/2012    Procedure: VIDEO ASSISTED THORACOSCOPY;  Surgeon: Ivin Poot, MD;  Location: [place];  Service: Thoracic;  Laterality:  Right;  . Decortication  12/11/2012    Procedure: DECORTICATION;  Surgeon: Ivin Poot, MD;  Location: [facility] OR;  Service: Thoracic;  Laterality: N/A;    REVIEW OF SYSTEMS:  Constitutional: negative Eyes: negative Ears, nose, mouth, throat, and face: negative Respiratory: negative Cardiovascular: negative Gastrointestinal: negative Genitourinary:negative Integument/breast: negative Hematologic/lymphatic: negative Musculoskeletal:positive for bone pain and Right lateral rib cage pain Neurological: negative Behavioral/Psych: negative Endocrine: negative Allergic/Immunologic: negative   PHYSICAL EXAMINATION: General appearance: alert, cooperative, fatigued and no distress Head: Normocephalic, without obvious abnormality, atraumatic Neck: no adenopathy, no JVD, supple, symmetrical, trachea midline and thyroid not enlarged, symmetric, no tenderness/mass/nodules Lymph nodes: Cervical, supraclavicular, and axillary nodes normal. Resp: clear to auscultation bilaterally Back: symmetric, no curvature. ROM normal. No CVA tenderness. Cardio: regular rate and rhythm, S1, S2 normal, no murmur, click, rub or gallop GI: soft, non-tender; bowel sounds normal; no masses,  no organomegaly Extremities: edema 1+ pitting edema bilateral lower extremities, more concentrated in the feet Neurologic: Alert and oriented X 3, normal strength and tone. Normal symmetric reflexes. Normal coordination and gait  ECOG  PERFORMANCE STATUS: 1 - Symptomatic but completely ambulatory  Blood pressure 166/64, pulse 66, temperature 98 F (36.7 C), temperature source Oral, resp. rate 20, height _0  (1.6 m), weight 232 lb 11.2 oz (105.552 kg).  LABORATORY DATA: Lab Results  Component Value Date   WBC 4.6 12/13/2013   HGB 8.1* 12/13/2013   HCT 26.2* 12/13/2013   MCV 99.6* 12/13/2013   PLT 120* 12/13/2013      Chemistry      Component Value Date/Time   NA 140 12/13/2013 0841   NA 136 03/16/2013 1930   K 4.2 12/13/2013 0841   K 3.8 03/16/2013 1930   CL 112* 05/10/2013 0849   CL 110 03/16/2013 1930   CO2 24 12/13/2013 0841   CO2 16* 03/16/2013 1930   BUN 9.9 12/13/2013 0841   BUN 22 03/16/2013 1930   CREATININE 0.7 12/13/2013 0841   CREATININE 1.24 03/16/2013 1930      Component Value Date/Time   CALCIUM 8.4 12/13/2013 0841   CALCIUM 8.2* 03/16/2013 1930   ALKPHOS 65 12/13/2013 0841   ALKPHOS 31* 03/16/2013 1930   AST 9 12/13/2013 0841   AST 11 03/16/2013 1930   ALT 12 12/13/2013 0841   ALT 15 03/16/2013 1930   BILITOT 1.07 12/13/2013 0841   BILITOT 0.2* 03/16/2013 1930     Labs from 11/25/2013 are as follows: Beta-2 microglobin 2.58, previously 5.77 on 08/30/2013, kappa free light chain 7.32, previously 156 on 08/30/2013, lambda free light chain 1.00 previously 0.27,: Lambda ratio 7.32 previously 577.78, IgG 843 previously 4940, IgA 76, previously less than 7, IgM 19, previously less than 4, total protein, serum 6.3 previously 11.3 on 01/06/2013.   ASSESSMENT AND PLAN: The patient is a very pleasant 53 years old African American male with recurrent multiple myeloma currently on treatment with Carfilzomib 56 mg/M2, Pomalyst 3 mg by mouth daily for 21 days every 3 weeks in addition to Decadron 40 mg on a weekly basis. He is status post 2 cycles, as well as days 1, 2, 8 and 9 of cycle #3. The patient's last protein studies show significant improvement in his disease. Patient was discussed with also seen by Dr. Julien Nordmann. His  hemoglobin has trended downward from 10 g/dL 2 weeks ago to 8.1 g/dL today. Given that he will proceed with day 15 and 16 of cycle #3 as scheduled, we will plan to transfuse him  1 unit of packed blood cells to address this chemotherapy-induced anemia. The patient and his wife are in agreement with this plan. He will proceed with days 15 and 16 of cycle #3 as scheduled and will receive one unit Packed red blood cells with day 16. He'll followup in 2 weeks for another symptom management prior to starting cycle #4. Will plan to check another set of protein studies after he completes cycle #4 of his chemotherapy with Carfilzomib 56 mg/M2, Pomalyst 3 mg by mouth daily for 21 days every 3 weeks in addition to Decadron 40 mg on a weekly basis. A refill of his lasix was sent to his pharmacy of record via E. scribed.  He was advised to call immediately if he has any concerning symptoms in the interval.  He was advised to call immediately if she has any concerning symptoms in the interval. The patient voices understanding of current disease status and treatment options and is in agreement with the current care plan.  All questions were answered. The patient knows to call the clinic with any problems, questions or concerns. We can certainly see the patient much sooner if necessary.    Disclaimer: This note was dictated with voice recognition software. Similar sounding words can inadvertently be transcribed and may not be corrected upon review.  Carlton Adam, PA-C 12/13/2013  ADDENDUM: Hematology/Oncology Attending: I had the face to face encounter with the patient. I recommended his care plan. This is a very pleasant 53 years old African American male with recurrent multiple myeloma currently undergoing systemic therapy with high-dose Carfilzomib, Pomalyst and Decadron and tolerating his treatment fairly well. He would proceed with the remaining part of cycle #3 today as scheduled. The patient would  come back for follow up visit in 2 weeks for evaluation before starting cycle #4. He was advised to call immediately if he has any concerning symptoms in the interval.  Disclaimer: This note was dictated with voice recognition software. Similar sounding words can inadvertently be transcribed and may not be corrected upon review.  Eilleen Kempf., MD 12/14/2013

## 2013-12-13 NOTE — Patient Instructions (Signed)
Take 10 mg today instead of 7.5 mg.  Then continue 5 mg daily except for 7.5 mg on Mondays Recheck PT/INR with next scheduled lab/appointments on 01/03/14.  We will see you in the infusion area

## 2013-12-13 NOTE — Patient Instructions (Signed)
Continuous lab and chemotherapy is scheduled You will receive one unit of packed red blood cell transfusion on 12/14/2013 to address her chemotherapy-induced anemia Followup in 2 weeks

## 2013-12-13 NOTE — Patient Instructions (Signed)
[facility] Discharge Instructions for Patients Receiving Chemotherapy  Today you received the following chemotherapy agents Kyprolis To help prevent nausea and vomiting after your treatment, we encourage you to take your nausea medication as prescribed.  If you develop nausea and vomiting that is not controlled by your nausea medication, call the clinic.   BELOW ARE SYMPTOMS THAT SHOULD BE REPORTED IMMEDIATELY:  *FEVER GREATER THAN 100.5 F  *CHILLS WITH OR WITHOUT FEVER  NAUSEA AND VOMITING THAT IS NOT CONTROLLED WITH YOUR NAUSEA MEDICATION  *UNUSUAL SHORTNESS OF BREATH  *UNUSUAL BRUISING OR BLEEDING  TENDERNESS IN MOUTH AND THROAT WITH OR WITHOUT PRESENCE OF ULCERS  *URINARY PROBLEMS  *BOWEL PROBLEMS  UNUSUAL RASH Items with * indicate a potential emergency and should be followed up as soon as possible.  Feel free to call the clinic you have any questions or concerns. The clinic phone number is [phone].    

## 2013-12-14 ENCOUNTER — Telehealth: Payer: Self-pay | Admitting: *Deleted

## 2013-12-14 ENCOUNTER — Ambulatory Visit (HOSPITAL_BASED_OUTPATIENT_CLINIC_OR_DEPARTMENT_OTHER): Payer: 59

## 2013-12-14 VITALS — BP 150/79 | HR 64 | Temp 97.5°F | Resp 18

## 2013-12-14 DIAGNOSIS — T451X5A Adverse effect of antineoplastic and immunosuppressive drugs, initial encounter: Secondary | ICD-10-CM

## 2013-12-14 DIAGNOSIS — Z5112 Encounter for antineoplastic immunotherapy: Secondary | ICD-10-CM

## 2013-12-14 DIAGNOSIS — D6481 Anemia due to antineoplastic chemotherapy: Secondary | ICD-10-CM

## 2013-12-14 DIAGNOSIS — C9000 Multiple myeloma not having achieved remission: Secondary | ICD-10-CM

## 2013-12-14 MED ORDER — DEXAMETHASONE SODIUM PHOSPHATE 10 MG/ML IJ SOLN
INTRAMUSCULAR | Status: AC
  Filled 2013-12-14: qty 1

## 2013-12-14 MED ORDER — HEPARIN SOD (PORK) LOCK FLUSH 100 UNIT/ML IV SOLN
500.0000 [IU] | Freq: Once | INTRAVENOUS | Status: AC | PRN
  Administered 2013-12-14: 500 [IU]
  Filled 2013-12-14: qty 5

## 2013-12-14 MED ORDER — DEXAMETHASONE SODIUM PHOSPHATE 10 MG/ML IJ SOLN
10.0000 mg | Freq: Once | INTRAMUSCULAR | Status: AC
  Administered 2013-12-14: 10 mg via INTRAVENOUS

## 2013-12-14 MED ORDER — ACETAMINOPHEN 325 MG PO TABS
ORAL_TABLET | ORAL | Status: AC
  Filled 2013-12-14: qty 2

## 2013-12-14 MED ORDER — SODIUM CHLORIDE 0.9 % IV SOLN
Freq: Once | INTRAVENOUS | Status: AC
  Administered 2013-12-14: 08:00:00 via INTRAVENOUS

## 2013-12-14 MED ORDER — ONDANSETRON 8 MG/50ML IVPB (CHCC)
8.0000 mg | Freq: Once | INTRAVENOUS | Status: AC
  Administered 2013-12-14: 8 mg via INTRAVENOUS

## 2013-12-14 MED ORDER — DIPHENHYDRAMINE HCL 50 MG/ML IJ SOLN
INTRAMUSCULAR | Status: AC
  Filled 2013-12-14: qty 1

## 2013-12-14 MED ORDER — CARFILZOMIB CHEMO INJECTION 60 MG
56.0000 mg/m2 | Freq: Once | INTRAVENOUS | Status: AC
  Administered 2013-12-14: 118 mg via INTRAVENOUS
  Filled 2013-12-14: qty 59

## 2013-12-14 MED ORDER — SODIUM CHLORIDE 0.9 % IJ SOLN
10.0000 mL | INTRAMUSCULAR | Status: DC | PRN
  Administered 2013-12-14: 10 mL
  Filled 2013-12-14: qty 10

## 2013-12-14 MED ORDER — ACETAMINOPHEN 325 MG PO TABS
650.0000 mg | ORAL_TABLET | Freq: Once | ORAL | Status: AC
  Administered 2013-12-14: 650 mg via ORAL

## 2013-12-14 MED ORDER — ONDANSETRON 8 MG/NS 50 ML IVPB
INTRAVENOUS | Status: AC
  Filled 2013-12-14: qty 8

## 2013-12-14 MED ORDER — DIPHENHYDRAMINE HCL 50 MG/ML IJ SOLN
25.0000 mg | Freq: Once | INTRAMUSCULAR | Status: AC
  Administered 2013-12-14: 25 mg via INTRAVENOUS

## 2013-12-14 NOTE — Patient Instructions (Signed)
[facility] Discharge Instructions for Patients Receiving Chemotherapy  Today you received the following chemotherapy agents Kyprolis To help prevent nausea and vomiting after your treatment, we encourage you to take your nausea medication as prescribed.   If you develop nausea and vomiting that is not controlled by your nausea medication, call the clinic.   BELOW ARE SYMPTOMS THAT SHOULD BE REPORTED IMMEDIATELY:  *FEVER GREATER THAN 100.5 F  *CHILLS WITH OR WITHOUT FEVER  NAUSEA AND VOMITING THAT IS NOT CONTROLLED WITH YOUR NAUSEA MEDICATION  *UNUSUAL SHORTNESS OF BREATH  *UNUSUAL BRUISING OR BLEEDING  TENDERNESS IN MOUTH AND THROAT WITH OR WITHOUT PRESENCE OF ULCERS  *URINARY PROBLEMS  *BOWEL PROBLEMS  UNUSUAL RASH Items with * indicate a potential emergency and should be followed up as soon as possible.  Feel free to call the clinic you have any questions or concerns. The clinic phone number is [phone].    Blood Transfusion Information WHAT IS A BLOOD TRANSFUSION? A transfusion is the replacement of blood or some of its parts. Blood is made up of multiple cells which provide different functions.  Red blood cells carry oxygen and are used for blood loss replacement.  White blood cells fight against infection.  Platelets control bleeding.  Plasma helps clot blood.  Other blood products are available for specialized needs, such as hemophilia or other clotting disorders. BEFORE THE TRANSFUSION  Who gives blood for transfusions?   You may be able to donate blood to be used at a later date on yourself (autologous donation).  Relatives can be asked to donate blood. This is generally not any safer than if you have received blood from a stranger. The same precautions are taken to ensure safety when a relative's blood is donated.  Healthy volunteers who are fully evaluated to make sure their blood is safe. This is blood bank blood. Transfusion  therapy is the safest it has ever been in the practice of medicine. Before blood is taken from a donor, a complete history is taken to make sure that person has no history of diseases nor engages in risky social behavior (examples are intravenous drug use or sexual activity with multiple partners). The donor's travel history is screened to minimize risk of transmitting infections, such as malaria. The donated blood is tested for signs of infectious diseases, such as HIV and hepatitis. The blood is then tested to be sure it is compatible with you in order to minimize the chance of a transfusion reaction. If you or a relative donates blood, this is often done in anticipation of surgery and is not appropriate for emergency situations. It takes many days to process the donated blood. RISKS AND COMPLICATIONS Although transfusion therapy is very safe and saves many lives, the main dangers of transfusion include:   Getting an infectious disease.  Developing a transfusion reaction. This is an allergic reaction to something in the blood you were given. Every precaution is taken to prevent this. The decision to have a blood transfusion has been considered carefully by your caregiver before blood is given. Blood is not given unless the benefits outweigh the risks. AFTER THE TRANSFUSION  Right after receiving a blood transfusion, you will usually feel much better and more energetic. This is especially true if your red blood cells have gotten low (anemic). The transfusion raises the level of the red blood cells which carry oxygen, and this usually causes an energy increase.  The nurse administering the transfusion will monitor you carefully  for complications. HOME CARE INSTRUCTIONS  No special instructions are needed after a transfusion. You may find your energy is better. Speak with your caregiver about any limitations on activity for underlying diseases you may have. SEEK MEDICAL CARE IF:   Your condition is  not improving after your transfusion.  You develop redness or irritation at the intravenous (IV) site. SEEK IMMEDIATE MEDICAL CARE IF:  Any of the following symptoms occur over the next 12 hours:  Shaking chills.  You have a temperature by mouth above 102 F (38.9 C), not controlled by medicine.  Chest, back, or muscle pain.  People around you feel you are not acting correctly or are confused.  Shortness of breath or difficulty breathing.  Dizziness and fainting.  You get a rash or develop hives.  You have a decrease in urine output.  Your urine turns a dark color or changes to pink, red, or brown. Any of the following symptoms occur over the next 10 days:  You have a temperature by mouth above 102 F (38.9 C), not controlled by medicine.  Shortness of breath.  Weakness after normal activity.  The white part of the eye turns yellow (jaundice).  You have a decrease in the amount of urine or are urinating less often.  Your urine turns a dark color or changes to pink, red, or brown. Document Released: 11/01/2000 Document Revised: 01/27/2012 Document Reviewed: 06/20/2008 [facility] Patient Information 2014 [place].

## 2013-12-14 NOTE — Telephone Encounter (Signed)
[name] from [facility] [phone] called requesting the doses of the kyrpolis given on 10/6, 10/7, 11/8, 11/19.  Message left with information.  SLJ

## 2013-12-15 LAB — TYPE AND SCREEN
ABO/RH(D): O POS
Antibody Screen: NEGATIVE
Donor AG Type: NEGATIVE
UNIT DIVISION: 0

## 2013-12-20 ENCOUNTER — Ambulatory Visit (HOSPITAL_BASED_OUTPATIENT_CLINIC_OR_DEPARTMENT_OTHER): Payer: 59 | Admitting: Physician Assistant

## 2013-12-20 ENCOUNTER — Telehealth: Payer: Self-pay | Admitting: *Deleted

## 2013-12-20 ENCOUNTER — Encounter: Payer: Self-pay | Admitting: Physician Assistant

## 2013-12-20 ENCOUNTER — Telehealth: Payer: Self-pay | Admitting: Medical Oncology

## 2013-12-20 ENCOUNTER — Encounter: Payer: Self-pay | Admitting: Medical Oncology

## 2013-12-20 ENCOUNTER — Other Ambulatory Visit (HOSPITAL_BASED_OUTPATIENT_CLINIC_OR_DEPARTMENT_OTHER): Payer: 59

## 2013-12-20 VITALS — BP 162/80 | HR 58 | Temp 98.2°F | Resp 19 | Ht 63.0 in | Wt 231.1 lb

## 2013-12-20 DIAGNOSIS — R51 Headache: Secondary | ICD-10-CM

## 2013-12-20 DIAGNOSIS — M7989 Other specified soft tissue disorders: Secondary | ICD-10-CM

## 2013-12-20 DIAGNOSIS — M542 Cervicalgia: Secondary | ICD-10-CM

## 2013-12-20 DIAGNOSIS — C9000 Multiple myeloma not having achieved remission: Secondary | ICD-10-CM

## 2013-12-20 LAB — CBC WITH DIFFERENTIAL/PLATELET
BASO%: 0.2 % (ref 0.0–2.0)
Basophils Absolute: 0 10*3/uL (ref 0.0–0.1)
EOS%: 8.9 % — AB (ref 0.0–7.0)
Eosinophils Absolute: 0.4 10*3/uL (ref 0.0–0.5)
HCT: 31.6 % — ABNORMAL LOW (ref 38.4–49.9)
HGB: 9.9 g/dL — ABNORMAL LOW (ref 13.0–17.1)
LYMPH%: 16.6 % (ref 14.0–49.0)
MCH: 30.1 pg (ref 27.2–33.4)
MCHC: 31.3 g/dL — AB (ref 32.0–36.0)
MCV: 96 fL (ref 79.3–98.0)
MONO#: 0.8 10*3/uL (ref 0.1–0.9)
MONO%: 19.7 % — ABNORMAL HIGH (ref 0.0–14.0)
NEUT#: 2.3 10*3/uL (ref 1.5–6.5)
NEUT%: 54.6 % (ref 39.0–75.0)
Platelets: 132 10*3/uL — ABNORMAL LOW (ref 140–400)
RBC: 3.29 10*6/uL — ABNORMAL LOW (ref 4.20–5.82)
RDW: 24.3 % — ABNORMAL HIGH (ref 11.0–14.6)
WBC: 4.2 10*3/uL (ref 4.0–10.3)
lymph#: 0.7 10*3/uL — ABNORMAL LOW (ref 0.9–3.3)
nRBC: 1 % — ABNORMAL HIGH (ref 0–0)

## 2013-12-20 LAB — COMPREHENSIVE METABOLIC PANEL (CC13)
ALT: 13 U/L (ref 0–55)
ANION GAP: 9 meq/L (ref 3–11)
AST: 8 U/L (ref 5–34)
Albumin: 3.4 g/dL — ABNORMAL LOW (ref 3.5–5.0)
Alkaline Phosphatase: 64 U/L (ref 40–150)
BILIRUBIN TOTAL: 0.99 mg/dL (ref 0.20–1.20)
BUN: 13.5 mg/dL (ref 7.0–26.0)
CO2: 20 mEq/L — ABNORMAL LOW (ref 22–29)
CREATININE: 0.7 mg/dL (ref 0.7–1.3)
Calcium: 9.1 mg/dL (ref 8.4–10.4)
Chloride: 111 mEq/L — ABNORMAL HIGH (ref 98–109)
Glucose: 94 mg/dl (ref 70–140)
Potassium: 4.6 mEq/L (ref 3.5–5.1)
Sodium: 140 mEq/L (ref 136–145)
Total Protein: 6.1 g/dL — ABNORMAL LOW (ref 6.4–8.3)

## 2013-12-20 MED ORDER — FUROSEMIDE 20 MG PO TABS: ORAL_TABLET | ORAL | Status: DC

## 2013-12-20 MED ORDER — DEXAMETHASONE 4 MG PO TABS: 40.0000 mg | ORAL_TABLET | ORAL | Status: DC

## 2013-12-20 NOTE — Telephone Encounter (Signed)
EARLIER THIS MORNING PT.'S WIFE HAD CALLED THE INFUSION ROOM. SHE SPOKE WITH MIRA LEONETTI,RN CONCERNING PT.'S SYMPTOMS. SINCE DR.MOHAMED WAS NOT IN THE OFFICE MIRA RECOMMENDED THE PT. GO TO THE EMERGENCY ROOM. PT. AND HIS WIFE STATED WOULD WAIT UNTIL DR.MOHAMED ARRIVED. THIS NURSE SPOKE TO [place] DR.MOHAMED CONCERNING PT.'S SYMPTOMS.PT. COULD BE SEEN AT 11:15AM TODAY. PT. AND HIS WIFE WILL COME BACK TO THE OFFICE AT 11:00AM TO REGISTER FOR THE 11:15AM APPOINTMENT.

## 2013-12-20 NOTE — Telephone Encounter (Signed)
Wife called this morning to infusion room stating patient here for labs and wishes for patient to be seen by Dr Julien Nordmann. She reports that patient c/o of neck pain and increasing headache for one week to right side, with right hand cramping. States also patient having increased edema to bilat feet and ankles with no relief from fluid pills, SOB (states not new symptom). Denies any other symptoms. Patient out in lobby, went out to lobby to inform spouse and patient that Dr Julien Nordmann not in office yet nor are his clinical staff. Recommended the ED to them but both patient and spouse wished to wait till Dr Julien Nordmann arrived. Desk nurse informed.

## 2013-12-20 NOTE — Patient Instructions (Signed)
Take your Lasix 20 mg on alternate days with 40 mg to address her increased lower extremity edema. Additionally decrease your by mouth sodium intake and increase your intake of water by mouth.  If your neck pain and headache pain persist or worsen notify us immediately so we can initiate appropriate evaluation Followup as previously scheduled

## 2013-12-20 NOTE — Progress Notes (Addendum)
[place] [phone]   [phone]  SHARED VISIT PROGRESS NOTE  [name]., MD [address] [place] [address]  Principle Diagnosis:  #1 recurrent multiple myeloma IgG kappa subtype diagnosed in June of 2008  #2 history of vasculitis and thrombosis of skin lesions   Prior Therapy:  #1 status post palliative radiotherapy to the left hip under the care of Dr. Sondra Come  #2 status post 5 cycles of systemic chemotherapy with Revlimid and low dose Decadron. Last dose given June 2009 with good response.  #3 status post autologous peripheral blood stem cell transplant at [facility] on 02/02/2008.  #4 the patient had evidence for disease recurrence in December 2010.  #5 Revlimid 25 mg by mouth daily for 21 days every 4 weeks in addition to Decadron 40 mg orally on a weekly basis. The patient is status post 28 cycles, discontinued today secondary to disease progression.  #6 Systemic chemotherapy with Velcade 1.3 mg/M2 on days 1, 4, 8 and 11 in addition to Doxil 30 mg/M2 on day 4 and Decadron 40 mg by mouth on weekly basis every 3 weeks. Status post 3 cycles, last dose was given 05/04/2012 discontinued secondary to intolerance.  #7 Salvage therapy treatment with the Pomalyst 4 mg by mouth daily for 21 days every 28 days as well as Cytoxan 50 mg by mouth every other day and dexamethasone 40 mg by mouth once weekly. Therapy beginning 09/25/2012, status post 2 cycles discontinued secondary to disease progression and intolerance.  #8 Kyprolis (Carfilzomib) 27 mg/M2 on days 1, 2, 8, 9, 15 and 16 every 4 weeks. First dose on 02/08/2013. This would be concurrent with the dexamethasone 40 mg by mouth on a weekly basis. Status post 3 cycles with disease progression.  #9 Systemic chemotherapy with Carfilzomib 27 mg/M2 days 1,2 , 8, 9, 15 and 16, Cytoxan 300 mg/M2 and dexamethasone 40 mg by mouth weekly every 4 weeks. Status post 4 cycles.  Last dose was given 08/09/2013 discontinued today secondary to disease progression.  Current therapy:  1) Systemic chemotherapy with Carfilzomib 56 mg/M2 days 1,2 , 8, 9, 15 and 16, Pomalyst 3 mg by mouth daily for 21 days every 4 weeks and dexamethasone 40 mg by mouth weekly every 4 weeks. First cycle started on 10/05/2013. Status post 2 cycles. The patient missed day 8 and 9 of the first cycle secondary to thrombocytopenia. So completed days 1, 2, 8 and 9 of cycle 3. 2) Zometa 4 mg IV given every 3 months.    INTERVAL HISTORY: [name] STEINFELDT 53 y.o. male returns to the clinic today for a work in visit accompanied by his wife. He complains of right sided neck pain when he turns towards the left as well as the posterior neck pain radiating to the right lateral parietal bone area. He complains of a headache associated with the symptoms. The headache is not associated with vision changes, nausea or vomiting. He did take 2 Excedrin migraine tablets on 2 occasions with resolution of the headache. He feels the neck discomfort may be coming from a "pulled muscle". He also notes increased swelling of his feet the ankles despite taking the Lasix 20 mg by mouth daily as prescribed. Of note the patient and his wife traveled to [facility], [place] over the weekend which is little over 5 Hour drive each way. He continues to not drink much water but is drinking a fair amount of  soda, coffee and juices.  He denies any fever, chills, diarrhea or constipation.  He denied having any other significant complaints today. He has no significant fatigue or weakness. He has no chest pain, shortness of breath, cough or hemoptysis. The patient denied having any nausea or vomiting. He has no weight loss or night sweats.   MEDICAL HISTORY: Past Medical History  Diagnosis Date  . Multiple myeloma 09/24/2011  . Hypertension     ALLERGIES:  is allergic to red dye and other.  MEDICATIONS:  Current Outpatient Prescriptions   Medication Sig Dispense Refill  . amLODipine (NORVASC) 10 MG tablet Take 10 mg by mouth daily.      . benzonatate (TESSALON) 200 MG capsule Take 200 mg by mouth 3 (three) times daily as needed for cough.       . chlorpheniramine-HYDROcodone (TUSSIONEX) 10-8 MG/5ML LQCR Take 5 mLs by mouth every 12 (twelve) hours as needed for cough.  115 mL  0  . dexamethasone (DECADRON) 4 MG tablet Take 10 tablets (40 mg total) by mouth once a week. Mondays with chemo  40 tablet  2  . furosemide (LASIX) 20 MG tablet Take 43m (1 tablet) daily, alternating with 416m(2 tablets)  every other day.  45 tablet  0  . HYDROcodone-acetaminophen (NORCO) 10-325 MG per tablet Take 1-2 tablets by mouth every 6 (six) hours as needed. For pain.  40 tablet  0  . lidocaine-prilocaine (EMLA) cream Apply 1 application topically as needed.  30 g  0  . morphine (MS CONTIN) 30 MG 12 hr tablet Take 30 mg by mouth every 12 (twelve) hours as needed for pain.      . Nebivolol HCl (BYSTOLIC) 20 MG TABS Take 30 mg by mouth daily.      . Marland Kitchenmeprazole (PRILOSEC) 40 MG capsule Take 40 mg by mouth daily as needed (for heartburn).       . Marland KitchenxyCODONE-acetaminophen (PERCOCET/ROXICET) 5-325 MG per tablet Take 1 tablet by mouth every 6 (six) hours as needed for moderate pain.      . pomalidomide (POMALYST) 3 MG capsule Take 1 capsule (3 mg total) by mouth daily. Take with water on days 1-21. Repeat every 28 days.   AUTH # [id number].  21 capsule  0  . potassium chloride SA (K-DUR,KLOR-CON) 20 MEQ tablet TAKE 1 TABLET BY MOUTH EVERY DAY  30 tablet  0  . pregabalin (LYRICA) 50 MG capsule Take 1 capsule (50 mg total) by mouth 3 (three) times daily.  270 capsule  1  . [facility] Mo[facility]    . prochlorperazine (COMPAZINE) 10 MG tablet Take 10 mg by mouth every 6 (six) hours as needed for nausea.      . Marland Kitchenarfarin (COUMADIN) 5 MG tablet Take 1-1.5 tablets (5-7.5 mg total) by mouth daily. Take  5 mg by mouth daily except 7.5 mg on Mondays  50 tablet  1   No current facility-administered medications for this visit.   Facility-Administered Medications Ordered in Other Visits  Medication Dose Route Frequency Provider Last Rate Last Dose  . sodium chloride 0.9 % injection 10 mL  10 mL Intracatheter PRN MoCurt BearsMD   10 mL at 07/27/13 1617    SURGICAL HISTORY:  Past Surgical History  Procedure Laterality Date  . Video bronchoscopy  12/11/2012    Procedure: VIDEO BRONCHOSCOPY;  Surgeon: PeIvin PootMD;  [facility] [facility];  Laterality:  N/A;  . Video assisted thoracoscopy  12/11/2012    Procedure: VIDEO ASSISTED THORACOSCOPY;  Surgeon: Ivin Poot, MD;  Location: [place];  Service: Thoracic;  Laterality: Right;  . Decortication  12/11/2012    Procedure: DECORTICATION;  Surgeon: Ivin Poot, MD;  Location: [facility] OR;  Service: Thoracic;  Laterality: N/A;    REVIEW OF SYSTEMS:  Constitutional: negative Eyes: negative Ears, nose, mouth, throat, and face: negative Respiratory: negative Cardiovascular: positive for lower extremity edema Gastrointestinal: negative Genitourinary:negative Integument/breast: negative Hematologic/lymphatic: negative Musculoskeletal:positive for bone pain and Right lateral rib cage pain, right posterior neck pain with decreased range of motion as well as radiation onto the skull Neurological: positive for headaches Behavioral/Psych: negative Endocrine: negative Allergic/Immunologic: negative   PHYSICAL EXAMINATION: General appearance: alert, cooperative, fatigued and no distress Head: Normocephalic, without obvious abnormality, atraumatic, Nontender Neck: no adenopathy, no JVD, supple, symmetrical, trachea midline, thyroid not enlarged, symmetric, no tenderness/mass/nodules and Point tenderness in the right trapezius musculature Lymph nodes: Cervical, supraclavicular, and axillary nodes normal. Resp: clear to auscultation  bilaterally Back: symmetric, no curvature. ROM normal. No CVA tenderness. Cardio: regular rate and rhythm, S1, S2 normal, no murmur, click, rub or gallop GI: soft, non-tender; bowel sounds normal; no masses,  no organomegaly Extremities: edema 2+ pitting edema bilateral lower extremities, more concentrated in the feet Neurologic: Alert and oriented X 3, normal strength and tone. Normal symmetric reflexes. Normal coordination and gait  ECOG PERFORMANCE STATUS: 1 - Symptomatic but completely ambulatory  Blood pressure 162/80, pulse 58, temperature 98.2 F (36.8 C), temperature source Oral, resp. rate 19, height '5\' 3"'  (1.6 m), weight 231 lb 1.6 oz (104.826 kg).  LABORATORY DATA: Lab Results  Component Value Date   WBC 4.2 12/20/2013   HGB 9.9* 12/20/2013   HCT 31.6* 12/20/2013   MCV 96.0 12/20/2013   PLT 132* 12/20/2013      Chemistry      Component Value Date/Time   NA 140 12/20/2013 0803   NA 136 03/16/2013 1930   K 4.6 12/20/2013 0803   K 3.8 03/16/2013 1930   CL 112* 05/10/2013 0849   CL 110 03/16/2013 1930   CO2 20* 12/20/2013 0803   CO2 16* 03/16/2013 1930   BUN 13.5 12/20/2013 0803   BUN 22 03/16/2013 1930   CREATININE 0.7 12/20/2013 0803   CREATININE 1.24 03/16/2013 1930      Component Value Date/Time   CALCIUM 9.1 12/20/2013 0803   CALCIUM 8.2* 03/16/2013 1930   ALKPHOS 64 12/20/2013 0803   ALKPHOS 31* 03/16/2013 1930   AST 8 12/20/2013 0803   AST 11 03/16/2013 1930   ALT 13 12/20/2013 0803   ALT 15 03/16/2013 1930   BILITOT 0.99 12/20/2013 0803   BILITOT 0.2* 03/16/2013 1930     Labs from 11/25/2013 are as follows: Beta-2 microglobin 2.58, previously 5.77 on 08/30/2013, kappa free light chain 7.32, previously 156 on 08/30/2013, lambda free light chain 1.00 previously 0.27,: Lambda ratio 7.32 previously 577.78, IgG 843 previously 4940, IgA 76, previously less than 7, IgM 19, previously less than 4, total protein, serum 6.3 previously 11.3 on 01/06/2013.   ASSESSMENT AND PLAN: The patient is a very  pleasant 53 years old African American male with recurrent multiple myeloma currently on treatment with Carfilzomib 56 mg/M2, Pomalyst 3 mg by mouth daily for 21 days every 3 weeks in addition to Decadron 40 mg on a weekly basis. He is status post 3 cycles.  The patient's last protein studies show significant  improvement in his disease. Patient was discussed with also seen by Dr. Julien Nordmann. His current neck and headaches and maybe secondary to musculoskeletal strain. We have asked the patient to continue with some symptomatic treatment he has a Flexeril at home and he was advised to take one tablet by mouth 3 times daily. Should his symptoms persist or worsen we will obtain a CT of the head for further evaluation. Regarding his lower extremity edema has increased although this was likely worsened by the long drive to and from the retreat as well as some dietary indiscretions with a sodium weight and foods. Patient is advised to adjust diet accordingly with decrease in by mouth excess sodium. We'll increase his Lasix to an alternating dose of 20 mg alternating with 40 mg on every other day. A prescription for the Lasix at the increased dose as well as a refill of his dexamethasone was sent to his pharmacy of record via E. scribed. He'll followup as previously scheduled prior to the start of cycle #4.  Will plan to check another set of protein studies after he completes cycle #4 of his chemotherapy with Carfilzomib 56 mg/M2, Pomalyst 3 mg by mouth daily for 21 days every 3 weeks in addition to Decadron 40 mg on a weekly basis.   He was advised to call immediately if he has any concerning symptoms in the interval.  He was advised to call immediately if she has any concerning symptoms in the interval. The patient voices understanding of current disease status and treatment options and is in agreement with the current care plan.  All questions were answered. The patient knows to call the clinic with any problems,  questions or concerns. We can certainly see the patient much sooner if necessary.    Carlton Adam, PA-C 12/20/2013  ADDENDUM: Hematology/Oncology Attending: I had the face to face encounter with the patient today. I recommended his care plan. This is a very pleasant 53 years old African American male undergoing systemic chemotherapy with Carfilzomib, Pomalyst and dexamethasone for relapse multiple myeloma status post 3 cycles. He is rating his treatment fairly well with no significant complaints. He presented today for evaluation of headache associated with pain on the back of the neck questionable for muscle spasm after a recent vacation in the [place] in [place]. He also has swelling of the lower extremities and is still taking Lasix 20 mg by mouth daily. I recommended for the patient to increase his Lasix to 40 mg alternating by 20 mg every other day for the swelling of the lower extremities. For the neck pain and the patient was advised to take Flexeril 10 mg by mouth 3 times a day in addition to his pain medication. If no improvement in the neck pain and headache will consider the patient for CT scan of the neck and head for further evaluation. He will continue with his chemotherapy as scheduled. He was advised to call immediately if he has any concerning symptoms in the interval.  Disclaimer: This note was dictated with voice recognition software. Similar sounding words can inadvertently be transcribed and may not be corrected upon review. Eilleen Kempf., MD 12/20/2013

## 2013-12-27 ENCOUNTER — Telehealth: Payer: Self-pay | Admitting: Internal Medicine

## 2013-12-27 ENCOUNTER — Other Ambulatory Visit: Payer: 59

## 2013-12-27 ENCOUNTER — Ambulatory Visit: Payer: 59

## 2013-12-27 ENCOUNTER — Ambulatory Visit (HOSPITAL_BASED_OUTPATIENT_CLINIC_OR_DEPARTMENT_OTHER): Payer: 59 | Admitting: Physician Assistant

## 2013-12-27 ENCOUNTER — Ambulatory Visit (HOSPITAL_BASED_OUTPATIENT_CLINIC_OR_DEPARTMENT_OTHER): Payer: 59

## 2013-12-27 ENCOUNTER — Encounter: Payer: Self-pay | Admitting: Physician Assistant

## 2013-12-27 ENCOUNTER — Other Ambulatory Visit (HOSPITAL_BASED_OUTPATIENT_CLINIC_OR_DEPARTMENT_OTHER): Payer: 59

## 2013-12-27 VITALS — BP 142/85 | HR 73 | Temp 97.9°F | Resp 18 | Ht 63.0 in | Wt 242.3 lb

## 2013-12-27 DIAGNOSIS — C9000 Multiple myeloma not having achieved remission: Secondary | ICD-10-CM

## 2013-12-27 DIAGNOSIS — C9002 Multiple myeloma in relapse: Secondary | ICD-10-CM

## 2013-12-27 DIAGNOSIS — G629 Polyneuropathy, unspecified: Secondary | ICD-10-CM

## 2013-12-27 DIAGNOSIS — Z5112 Encounter for antineoplastic immunotherapy: Secondary | ICD-10-CM

## 2013-12-27 DIAGNOSIS — I10 Essential (primary) hypertension: Secondary | ICD-10-CM

## 2013-12-27 DIAGNOSIS — G8918 Other acute postprocedural pain: Secondary | ICD-10-CM

## 2013-12-27 LAB — COMPREHENSIVE METABOLIC PANEL (CC13)
ALT: 16 U/L (ref 0–55)
ANION GAP: 9 meq/L (ref 3–11)
AST: 10 U/L (ref 5–34)
Albumin: 3.3 g/dL — ABNORMAL LOW (ref 3.5–5.0)
Alkaline Phosphatase: 56 U/L (ref 40–150)
BUN: 15 mg/dL (ref 7.0–26.0)
CALCIUM: 8 mg/dL — AB (ref 8.4–10.4)
CO2: 24 meq/L (ref 22–29)
CREATININE: 0.7 mg/dL (ref 0.7–1.3)
Chloride: 110 mEq/L — ABNORMAL HIGH (ref 98–109)
Glucose: 83 mg/dl (ref 70–140)
Potassium: 3.6 mEq/L (ref 3.5–5.1)
Sodium: 143 mEq/L (ref 136–145)
Total Bilirubin: 1.32 mg/dL — ABNORMAL HIGH (ref 0.20–1.20)
Total Protein: 5.9 g/dL — ABNORMAL LOW (ref 6.4–8.3)

## 2013-12-27 LAB — CBC WITH DIFFERENTIAL/PLATELET
BASO%: 0.3 % (ref 0.0–2.0)
BASOS ABS: 0 10*3/uL (ref 0.0–0.1)
EOS ABS: 0.1 10*3/uL (ref 0.0–0.5)
EOS%: 0.9 % (ref 0.0–7.0)
HCT: 30.5 % — ABNORMAL LOW (ref 38.4–49.9)
HGB: 9.6 g/dL — ABNORMAL LOW (ref 13.0–17.1)
LYMPH%: 15.1 % (ref 14.0–49.0)
MCH: 30.8 pg (ref 27.2–33.4)
MCHC: 31.5 g/dL — ABNORMAL LOW (ref 32.0–36.0)
MCV: 97.8 fL (ref 79.3–98.0)
MONO#: 1.6 10*3/uL — ABNORMAL HIGH (ref 0.1–0.9)
MONO%: 24.6 % — ABNORMAL HIGH (ref 0.0–14.0)
NEUT%: 59.1 % (ref 39.0–75.0)
NEUTROS ABS: 3.9 10*3/uL (ref 1.5–6.5)
PLATELETS: 257 10*3/uL (ref 140–400)
RBC: 3.12 10*6/uL — ABNORMAL LOW (ref 4.20–5.82)
RDW: 23.2 % — ABNORMAL HIGH (ref 11.0–14.6)
WBC: 6.6 10*3/uL (ref 4.0–10.3)
lymph#: 1 10*3/uL (ref 0.9–3.3)
nRBC: 3 % — ABNORMAL HIGH (ref 0–0)

## 2013-12-27 MED ORDER — SODIUM CHLORIDE 0.9 % IJ SOLN
10.0000 mL | INTRAMUSCULAR | Status: DC | PRN
  Administered 2013-12-27: 10 mL
  Filled 2013-12-27: qty 10

## 2013-12-27 MED ORDER — ONDANSETRON 8 MG/50ML IVPB (CHCC)
8.0000 mg | Freq: Once | INTRAVENOUS | Status: AC
  Administered 2013-12-27: 8 mg via INTRAVENOUS

## 2013-12-27 MED ORDER — SODIUM CHLORIDE 0.9 % IV SOLN: Freq: Once | INTRAVENOUS | Status: DC

## 2013-12-27 MED ORDER — HEPARIN SOD (PORK) LOCK FLUSH 100 UNIT/ML IV SOLN
500.0000 [IU] | Freq: Once | INTRAVENOUS | Status: AC | PRN
  Administered 2013-12-27: 500 [IU]
  Filled 2013-12-27: qty 5

## 2013-12-27 MED ORDER — DEXAMETHASONE SODIUM PHOSPHATE 10 MG/ML IJ SOLN
10.0000 mg | Freq: Once | INTRAMUSCULAR | Status: AC
  Administered 2013-12-27: 10 mg via INTRAVENOUS

## 2013-12-27 MED ORDER — DEXTROSE 5 % IV SOLN
56.0000 mg/m2 | Freq: Once | INTRAVENOUS | Status: AC
  Administered 2013-12-27: 118 mg via INTRAVENOUS
  Filled 2013-12-27: qty 59

## 2013-12-27 MED ORDER — ONDANSETRON 8 MG/NS 50 ML IVPB
INTRAVENOUS | Status: AC
Start: 2013-12-27 — End: 2013-12-27
  Filled 2013-12-27: qty 8

## 2013-12-27 MED ORDER — HYDROCODONE-ACETAMINOPHEN 10-325 MG PO TABS: 1.0000 | ORAL_TABLET | Freq: Four times a day (QID) | ORAL | Status: DC | PRN

## 2013-12-27 MED ORDER — DEXAMETHASONE SODIUM PHOSPHATE 10 MG/ML IJ SOLN
INTRAMUSCULAR | Status: AC
  Filled 2013-12-27: qty 1

## 2013-12-27 MED ORDER — SODIUM CHLORIDE 0.9 % IV SOLN
Freq: Once | INTRAVENOUS | Status: AC
  Administered 2013-12-27: 10:00:00 via INTRAVENOUS

## 2013-12-27 NOTE — Patient Instructions (Signed)
[facility] Discharge Instructions for Patients Receiving Chemotherapy  Today you received the following chemotherapy agents Kyprolis To help prevent nausea and vomiting after your treatment, we encourage you to take your nausea medication as prescribed.  If you develop nausea and vomiting that is not controlled by your nausea medication, call the clinic.   BELOW ARE SYMPTOMS THAT SHOULD BE REPORTED IMMEDIATELY:  *FEVER GREATER THAN 100.5 F  *CHILLS WITH OR WITHOUT FEVER  NAUSEA AND VOMITING THAT IS NOT CONTROLLED WITH YOUR NAUSEA MEDICATION  *UNUSUAL SHORTNESS OF BREATH  *UNUSUAL BRUISING OR BLEEDING  TENDERNESS IN MOUTH AND THROAT WITH OR WITHOUT PRESENCE OF ULCERS  *URINARY PROBLEMS  *BOWEL PROBLEMS  UNUSUAL RASH Items with * indicate a potential emergency and should be followed up as soon as possible.  Feel free to call the clinic you have any questions or concerns. The clinic phone number is [phone].    

## 2013-12-27 NOTE — Progress Notes (Signed)
[place] [phone]   [phone]) [phone]  SHARED VISIT PROGRESS NOTE  [name]., MD [address] [place] [address]  Principle Diagnosis:  #1 recurrent multiple myeloma IgG kappa subtype diagnosed in June of 2008  #2 history of vasculitis and thrombosis of skin lesions   Prior Therapy:  #1 status post palliative radiotherapy to the left hip under the care of Dr. Sondra Come  #2 status post 5 cycles of systemic chemotherapy with Revlimid and low dose Decadron. Last dose given June 2009 with good response.  #3 status post autologous peripheral blood stem cell transplant at [facility] on 02/02/2008.  #4 the patient had evidence for disease recurrence in December 2010.  #5 Revlimid 25 mg by mouth daily for 21 days every 4 weeks in addition to Decadron 40 mg orally on a weekly basis. The patient is status post 28 cycles, discontinued today secondary to disease progression.  #6 Systemic chemotherapy with Velcade 1.3 mg/M2 on days 1, 4, 8 and 11 in addition to Doxil 30 mg/M2 on day 4 and Decadron 40 mg by mouth on weekly basis every 3 weeks. Status post 3 cycles, last dose was given 05/04/2012 discontinued secondary to intolerance.  #7 Salvage therapy treatment with the Pomalyst 4 mg by mouth daily for 21 days every 28 days as well as Cytoxan 50 mg by mouth every other day and dexamethasone 40 mg by mouth once weekly. Therapy beginning 09/25/2012, status post 2 cycles discontinued secondary to disease progression and intolerance.  #8 Kyprolis (Carfilzomib) 27 mg/M2 on days 1, 2, 8, 9, 15 and 16 every 4 weeks. First dose on 02/08/2013. This would be concurrent with the dexamethasone 40 mg by mouth on a weekly basis. Status post 3 cycles with disease progression.  #9 Systemic chemotherapy with Carfilzomib 27 mg/M2 days 1,2 , 8, 9, 15 and 16, Cytoxan 300 mg/M2 and dexamethasone 40 mg by mouth weekly every 4 weeks. Status post 4 cycles.  Last dose was given 08/09/2013 discontinued today secondary to disease progression.  Current therapy:  1) Systemic chemotherapy with Carfilzomib 56 mg/M2 days 1,2 , 8, 9, 15 and 16, Pomalyst 3 mg by mouth daily for 21 days every 4 weeks and dexamethasone 40 mg by mouth weekly every 4 weeks. First cycle started on 10/05/2013. Status post 2 cycles. The patient missed day 8 and 9 of the first cycle secondary to thrombocytopenia. Status post 3 cycles 2) Zometa 4 mg IV given every 3 months.    INTERVAL HISTORY: [name] 53 y.o. male returns to the clinic today for a work in visit accompanied by his wife. His neck discomfort and headache have completely resolved. He feels well today he voices no complaints. He requests a refill for his pain medication.  He continues to not drink much water but is drinking a fair amount of soda, coffee and juices.  He denies any fever, chills, diarrhea or constipation.  He denied having any other significant complaints today. He has no significant fatigue or weakness. He has no chest pain, shortness of breath, cough or hemoptysis. The patient denied having any nausea or vomiting. He has no weight loss or night sweats.   MEDICAL HISTORY: Past Medical History  Diagnosis Date  . Multiple myeloma 09/24/2011  . Hypertension     ALLERGIES:  is allergic to red dye and other.  MEDICATIONS:  Current Outpatient Prescriptions  Medication Sig Dispense Refill  . amLODipine (NORVASC)  10 MG tablet Take 10 mg by mouth daily.      . benzonatate (TESSALON) 200 MG capsule Take 200 mg by mouth 3 (three) times daily as needed for cough.       . chlorpheniramine-HYDROcodone (TUSSIONEX) 10-8 MG/5ML LQCR Take 5 mLs by mouth every 12 (twelve) hours as needed for cough.  115 mL  0  . dexamethasone (DECADRON) 4 MG tablet Take 10 tablets (40 mg total) by mouth once a week. Mondays with chemo  40 tablet  2  . furosemide (LASIX) 20 MG tablet Take 48m (1 tablet) daily, alternating with  420m(2 tablets)  every other day.  45 tablet  0  . HYDROcodone-acetaminophen (NORCO) 10-325 MG per tablet Take 1-2 tablets by mouth every 6 (six) hours as needed. For pain.  40 tablet  0  . lidocaine-prilocaine (EMLA) cream Apply 1 application topically as needed.  30 g  0  . Nebivolol HCl (BYSTOLIC) 20 MG TABS Take 30 mg by mouth daily.      . Marland Kitchenmeprazole (PRILOSEC) 40 MG capsule Take 40 mg by mouth daily as needed (for heartburn).       . Marland KitchenxyCODONE-acetaminophen (PERCOCET/ROXICET) 5-325 MG per tablet Take 1 tablet by mouth every 6 (six) hours as needed for moderate pain.      . pomalidomide (POMALYST) 3 MG capsule Take 1 capsule (3 mg total) by mouth daily. Take with water on days 1-21. Repeat every 28 days.   AUTH # [id number].  21 capsule  0  . potassium chloride SA (K-DUR,KLOR-CON) 20 MEQ tablet TAKE 1 TABLET BY MOUTH EVERY DAY  30 tablet  0  . pregabalin (LYRICA) 50 MG capsule Take 1 capsule (50 mg total) by mouth 3 (three) times daily.  270 capsule  1  . PRESCRIPTION MEDICATION [facility] Mo[facility]arlie Server    . prochlorperazine (COMPAZINE) 10 MG tablet Take 10 mg by mouth every 6 (six) hours as needed for nausea.      . Marland Kitchenarfarin (COUMADIN) 5 MG tablet Take 1-1.5 tablets (5-7.5 mg total) by mouth daily. Take 5 mg by mouth daily except 7.5 mg on Mondays  50 tablet  1  . morphine (MS CONTIN) 30 MG 12 hr tablet Take 30 mg by mouth every 12 (twelve) hours as needed for pain.       No current facility-administered medications for this visit.   Facility-Administered Medications Ordered in Other Visits  Medication Dose Route Frequency Provider Last Rate Last Dose  . 0.9 %  sodium chloride infusion   Intravenous Once [name] Bommarito E Chistopher Mangino, PA-C      . sodium chloride 0.9 % injection 10 mL  10 mL Intracatheter PRN MoCurt BearsMD   10 mL at 07/27/13 1617  . sodium chloride 0.9 % injection 10 mL  10 mL Intracatheter PRN AdCarlton AdamPA-C   10 mL at 12/27/13 1123     SURGICAL HISTORY:  Past Surgical History  Procedure Laterality Date  . Video bronchoscopy  12/11/2012    Procedure: VIDEO BRONCHOSCOPY;  Surgeon: PeIvin PootMD;  [facility];  Laterality: N/A;  . Video assisted thoracoscopy  12/11/2012    Procedure: VIDEO ASSISTED THORACOSCOPY;  Surgeon: PeIvin PootMD;  [facility];  Laterality: Right;  . Decortication  12/11/2012    Procedure: DECORTICATION;  Surgeon: PeIvin PootMD;  [facility];  Laterality: N/A;  REVIEW OF SYSTEMS:  Constitutional: negative Eyes: negative Ears, nose, mouth, throat, and face: negative Respiratory: negative Cardiovascular: positive for lower extremity edema Gastrointestinal: negative Genitourinary:negative Integument/breast: negative Hematologic/lymphatic: negative Musculoskeletal:negative Neurological: negative Behavioral/Psych: negative Endocrine: negative Allergic/Immunologic: negative   PHYSICAL EXAMINATION: General appearance: alert, cooperative, fatigued and no distress Head: Normocephalic, without obvious abnormality, atraumatic, Nontender Neck: no adenopathy, no JVD, supple, symmetrical, trachea midline and thyroid not enlarged, symmetric, no tenderness/mass/nodules Lymph nodes: Cervical, supraclavicular, and axillary nodes normal. Resp: clear to auscultation bilaterally Back: symmetric, no curvature. ROM normal. No CVA tenderness. Cardio: regular rate and rhythm, S1, S2 normal, no murmur, click, rub or gallop GI: soft, non-tender; bowel sounds normal; no masses,  no organomegaly Extremities: edema trace - 1+ pitting edema bilateral lower extremities Neurologic: Alert and oriented X 3, normal strength and tone. Normal symmetric reflexes. Normal coordination and gait  ECOG PERFORMANCE STATUS: 1 - Symptomatic but completely ambulatory  Blood pressure 142/85, pulse 73, temperature 97.9 F (36.6 C), temperature  source Oral, resp. rate 18, height '5\' 3"'  (1.6 m), weight 242 lb 4.8 oz (109.907 kg).  LABORATORY DATA: Lab Results  Component Value Date   WBC 6.6 12/27/2013   HGB 9.6* 12/27/2013   HCT 30.5* 12/27/2013   MCV 97.8 12/27/2013   PLT 257 12/27/2013      Chemistry      Component Value Date/Time   NA 143 12/27/2013 0826   NA 136 03/16/2013 1930   K 3.6 12/27/2013 0826   K 3.8 03/16/2013 1930   CL 112* 05/10/2013 0849   CL 110 03/16/2013 1930   CO2 24 12/27/2013 0826   CO2 16* 03/16/2013 1930   BUN 15.0 12/27/2013 0826   BUN 22 03/16/2013 1930   CREATININE 0.7 12/27/2013 0826   CREATININE 1.24 03/16/2013 1930      Component Value Date/Time   CALCIUM 8.0* 12/27/2013 0826   CALCIUM 8.2* 03/16/2013 1930   ALKPHOS 56 12/27/2013 0826   ALKPHOS 31* 03/16/2013 1930   AST 10 12/27/2013 0826   AST 11 03/16/2013 1930   ALT 16 12/27/2013 0826   ALT 15 03/16/2013 1930   BILITOT 1.32* 12/27/2013 0826   BILITOT 0.2* 03/16/2013 1930     Labs from 11/25/2013 are as follows: Beta-2 microglobin 2.58, previously 5.77 on 08/30/2013, kappa free light chain 7.32, previously 156 on 08/30/2013, lambda free light chain 1.00 previously 0.27,: Lambda ratio 7.32 previously 577.78, IgG 843 previously 4940, IgA 76, previously less than 7, IgM 19, previously less than 4, total protein, serum 6.3 previously 11.3 on 01/06/2013.   ASSESSMENT AND PLAN: The patient is a very pleasant 53 years old African American male with recurrent multiple myeloma currently on treatment with Carfilzomib 56 mg/M2, Pomalyst 3 mg by mouth daily for 21 days every 3 weeks in addition to Decadron 40 mg on a weekly basis. He is status post 3 cycles.  The patient's last protein studies show significant improvement in his disease. Patient was discussed with also seen by Dr. Julien Nordmann. He has hydrocodone prescription was refilled. He will proceed with the start of cycle #4 of his chemotherapy today as scheduled. He'll followup in 2 weeks for another symptom management visit with a  CBC differential and C. met. Will plan to check another set of protein studies after he completes cycle #4 of his chemotherapy with Carfilzomib 56 mg/M2, Pomalyst 3 mg by mouth daily for 21 days every 3 weeks in addition to Decadron 40 mg on a weekly basis.   He  was advised to call immediately if he has any concerning symptoms in the interval.  He was advised to call immediately if she has any concerning symptoms in the interval. The patient voices understanding of current disease status and treatment options and is in agreement with the current care plan.  All questions were answered. The patient knows to call the clinic with any problems, questions or concerns. We can certainly see the patient much sooner if necessary.    Carlton Adam, PA-C 12/27/2013  ADDENDUM: Hematology/Oncology Attending: I had the face to face encounter with the patient today. I recommended his care plan. Mr. Ohm is doing fine today with no specific complaints. He is currently undergoing systemic chemotherapy with Carfilzomib,  Pomalyst and dexamethasone for relapsed multiple myeloma and tolerating his treatment fairly well. We'll proceed with cycle #4 today as scheduled. The patient would come back for follow up visit in 2 weeks for reevaluation and management any adverse effect of his treatment. He was advised to call immediately if he has any concerning symptoms in the interval.  Disclaimer: This note was dictated with voice recognition software. Similar sounding words can inadvertently be transcribed and may not be corrected upon review. Eilleen Kempf., MD 12/27/2013

## 2013-12-27 NOTE — Patient Instructions (Signed)
Continue lab and chemotherapy as scheduled Follow up in 2 weeks

## 2013-12-27 NOTE — Telephone Encounter (Signed)
Gave pt appt for lab, md and chemo emailed Sharyn Lull regarding chemo for February 2015

## 2013-12-28 ENCOUNTER — Telehealth: Payer: Self-pay | Admitting: *Deleted

## 2013-12-28 ENCOUNTER — Telehealth: Payer: Self-pay | Admitting: Internal Medicine

## 2013-12-28 ENCOUNTER — Encounter: Payer: Self-pay | Admitting: Specialist

## 2013-12-28 ENCOUNTER — Ambulatory Visit (HOSPITAL_BASED_OUTPATIENT_CLINIC_OR_DEPARTMENT_OTHER): Payer: 59

## 2013-12-28 VITALS — BP 153/79 | HR 69 | Temp 97.8°F

## 2013-12-28 DIAGNOSIS — Z5112 Encounter for antineoplastic immunotherapy: Secondary | ICD-10-CM

## 2013-12-28 DIAGNOSIS — C9002 Multiple myeloma in relapse: Secondary | ICD-10-CM

## 2013-12-28 DIAGNOSIS — C9000 Multiple myeloma not having achieved remission: Secondary | ICD-10-CM

## 2013-12-28 MED ORDER — HEPARIN SOD (PORK) LOCK FLUSH 100 UNIT/ML IV SOLN
500.0000 [IU] | Freq: Once | INTRAVENOUS | Status: AC | PRN
  Administered 2013-12-28: 500 [IU]
  Filled 2013-12-28: qty 5

## 2013-12-28 MED ORDER — SODIUM CHLORIDE 0.9 % IV SOLN
Freq: Once | INTRAVENOUS | Status: AC
  Administered 2013-12-28: 08:00:00 via INTRAVENOUS

## 2013-12-28 MED ORDER — ONDANSETRON 8 MG/NS 50 ML IVPB
INTRAVENOUS | Status: AC
Start: 2013-12-28 — End: 2013-12-28
  Filled 2013-12-28: qty 8

## 2013-12-28 MED ORDER — DEXAMETHASONE SODIUM PHOSPHATE 10 MG/ML IJ SOLN
10.0000 mg | Freq: Once | INTRAMUSCULAR | Status: AC
  Administered 2013-12-28: 10 mg via INTRAVENOUS

## 2013-12-28 MED ORDER — ONDANSETRON 8 MG/50ML IVPB (CHCC)
8.0000 mg | Freq: Once | INTRAVENOUS | Status: AC
  Administered 2013-12-28: 8 mg via INTRAVENOUS

## 2013-12-28 MED ORDER — DEXAMETHASONE SODIUM PHOSPHATE 10 MG/ML IJ SOLN
INTRAMUSCULAR | Status: AC
  Filled 2013-12-28: qty 1

## 2013-12-28 MED ORDER — SODIUM CHLORIDE 0.9 % IJ SOLN
10.0000 mL | INTRAMUSCULAR | Status: DC | PRN
  Administered 2013-12-28: 10 mL
  Filled 2013-12-28: qty 10

## 2013-12-28 MED ORDER — DEXTROSE 5 % IV SOLN
56.0000 mg/m2 | Freq: Once | INTRAVENOUS | Status: AC
  Administered 2013-12-28: 118 mg via INTRAVENOUS
  Filled 2013-12-28: qty 59

## 2013-12-28 NOTE — Progress Notes (Signed)
Met with patient and caregiver. Provided chaplain and support group education. Offered support.

## 2013-12-28 NOTE — Telephone Encounter (Signed)
Per staff message I have adjusted 2/*23 appt 

## 2013-12-28 NOTE — Telephone Encounter (Signed)
Called pt and left message with pt's wife regarding next chemo on 01/03/14

## 2013-12-28 NOTE — Patient Instructions (Signed)
[facility] Discharge Instructions for Patients Receiving Chemotherapy  Today you received the following chemotherapy agents Kyprolis To help prevent nausea and vomiting after your treatment, we encourage you to take your nausea medication as prescribed.  If you develop nausea and vomiting that is not controlled by your nausea medication, call the clinic.   BELOW ARE SYMPTOMS THAT SHOULD BE REPORTED IMMEDIATELY:  *FEVER GREATER THAN 100.5 F  *CHILLS WITH OR WITHOUT FEVER  NAUSEA AND VOMITING THAT IS NOT CONTROLLED WITH YOUR NAUSEA MEDICATION  *UNUSUAL SHORTNESS OF BREATH  *UNUSUAL BRUISING OR BLEEDING  TENDERNESS IN MOUTH AND THROAT WITH OR WITHOUT PRESENCE OF ULCERS  *URINARY PROBLEMS  *BOWEL PROBLEMS  UNUSUAL RASH Items with * indicate a potential emergency and should be followed up as soon as possible.  Feel free to call the clinic you have any questions or concerns. The clinic phone number is [phone].    

## 2014-01-03 ENCOUNTER — Other Ambulatory Visit: Payer: Self-pay | Admitting: Medical Oncology

## 2014-01-03 ENCOUNTER — Encounter (INDEPENDENT_AMBULATORY_CARE_PROVIDER_SITE_OTHER): Payer: Self-pay

## 2014-01-03 ENCOUNTER — Ambulatory Visit: Payer: 59 | Admitting: Pharmacist

## 2014-01-03 ENCOUNTER — Ambulatory Visit (HOSPITAL_BASED_OUTPATIENT_CLINIC_OR_DEPARTMENT_OTHER): Payer: 59

## 2014-01-03 ENCOUNTER — Other Ambulatory Visit (HOSPITAL_BASED_OUTPATIENT_CLINIC_OR_DEPARTMENT_OTHER): Payer: 59

## 2014-01-03 VITALS — BP 160/86 | HR 80 | Temp 98.8°F

## 2014-01-03 DIAGNOSIS — C9000 Multiple myeloma not having achieved remission: Secondary | ICD-10-CM

## 2014-01-03 DIAGNOSIS — Z5112 Encounter for antineoplastic immunotherapy: Secondary | ICD-10-CM

## 2014-01-03 DIAGNOSIS — I776 Arteritis, unspecified: Secondary | ICD-10-CM

## 2014-01-03 DIAGNOSIS — C9002 Multiple myeloma in relapse: Secondary | ICD-10-CM

## 2014-01-03 LAB — COMPREHENSIVE METABOLIC PANEL (CC13)
ALK PHOS: 52 U/L (ref 40–150)
ALT: 18 U/L (ref 0–55)
ANION GAP: 8 meq/L (ref 3–11)
AST: 10 U/L (ref 5–34)
Albumin: 3.3 g/dL — ABNORMAL LOW (ref 3.5–5.0)
BILIRUBIN TOTAL: 1.1 mg/dL (ref 0.20–1.20)
BUN: 12.8 mg/dL (ref 7.0–26.0)
CO2: 23 meq/L (ref 22–29)
CREATININE: 0.7 mg/dL (ref 0.7–1.3)
Calcium: 9.2 mg/dL (ref 8.4–10.4)
Chloride: 108 mEq/L (ref 98–109)
Glucose: 97 mg/dl (ref 70–140)
Potassium: 4.1 mEq/L (ref 3.5–5.1)
SODIUM: 138 meq/L (ref 136–145)
Total Protein: 6.4 g/dL (ref 6.4–8.3)

## 2014-01-03 LAB — CBC WITH DIFFERENTIAL/PLATELET
BASO%: 0.4 % (ref 0.0–2.0)
Basophils Absolute: 0 10*3/uL (ref 0.0–0.1)
EOS%: 2.4 % (ref 0.0–7.0)
Eosinophils Absolute: 0.1 10*3/uL (ref 0.0–0.5)
HEMATOCRIT: 32.1 % — AB (ref 38.4–49.9)
HGB: 10.5 g/dL — ABNORMAL LOW (ref 13.0–17.1)
LYMPH%: 8.6 % — AB (ref 14.0–49.0)
MCH: 31.9 pg (ref 27.2–33.4)
MCHC: 32.6 g/dL (ref 32.0–36.0)
MCV: 98 fL (ref 79.3–98.0)
MONO#: 0.8 10*3/uL (ref 0.1–0.9)
MONO%: 14.2 % — AB (ref 0.0–14.0)
NEUT#: 4.2 10*3/uL (ref 1.5–6.5)
NEUT%: 74.4 % (ref 39.0–75.0)
PLATELETS: 114 10*3/uL — AB (ref 140–400)
RBC: 3.28 10*6/uL — AB (ref 4.20–5.82)
RDW: 24.1 % — ABNORMAL HIGH (ref 11.0–14.6)
WBC: 5.6 10*3/uL (ref 4.0–10.3)
lymph#: 0.5 10*3/uL — ABNORMAL LOW (ref 0.9–3.3)

## 2014-01-03 LAB — PROTIME-INR
INR: 2.7 (ref 2.00–3.50)
Protime: 32.4 Seconds — ABNORMAL HIGH (ref 10.6–13.4)

## 2014-01-03 MED ORDER — DEXTROSE 5 % IV SOLN
56.0000 mg/m2 | Freq: Once | INTRAVENOUS | Status: AC
  Administered 2014-01-03: 118 mg via INTRAVENOUS
  Filled 2014-01-03: qty 59

## 2014-01-03 MED ORDER — SODIUM CHLORIDE 0.9 % IV SOLN
Freq: Once | INTRAVENOUS | Status: AC
  Administered 2014-01-03: 09:00:00 via INTRAVENOUS

## 2014-01-03 MED ORDER — DEXAMETHASONE SODIUM PHOSPHATE 10 MG/ML IJ SOLN
INTRAMUSCULAR | Status: AC
  Filled 2014-01-03: qty 1

## 2014-01-03 MED ORDER — HEPARIN SOD (PORK) LOCK FLUSH 100 UNIT/ML IV SOLN
500.0000 [IU] | Freq: Once | INTRAVENOUS | Status: AC | PRN
  Administered 2014-01-03: 500 [IU]
  Filled 2014-01-03: qty 5

## 2014-01-03 MED ORDER — SODIUM CHLORIDE 0.9 % IJ SOLN
10.0000 mL | INTRAMUSCULAR | Status: DC | PRN
  Administered 2014-01-03: 10 mL
  Filled 2014-01-03: qty 10

## 2014-01-03 MED ORDER — ONDANSETRON 8 MG/50ML IVPB (CHCC)
8.0000 mg | Freq: Once | INTRAVENOUS | Status: AC
  Administered 2014-01-03: 8 mg via INTRAVENOUS

## 2014-01-03 MED ORDER — DEXAMETHASONE SODIUM PHOSPHATE 10 MG/ML IJ SOLN
10.0000 mg | Freq: Once | INTRAMUSCULAR | Status: AC
  Administered 2014-01-03: 10 mg via INTRAVENOUS

## 2014-01-03 MED ORDER — ONDANSETRON 8 MG/NS 50 ML IVPB
INTRAVENOUS | Status: AC
  Filled 2014-01-03: qty 8

## 2014-01-03 NOTE — Patient Instructions (Signed)
[facility] Discharge Instructions for Patients Receiving Chemotherapy  Today you received the following chemotherapy agents kyprolis  To help prevent nausea and vomiting after your treatment, we encourage you to take your nausea medication as directed   If you develop nausea and vomiting that is not controlled by your nausea medication, call the clinic.   BELOW ARE SYMPTOMS THAT SHOULD BE REPORTED IMMEDIATELY:  *FEVER GREATER THAN 100.5 F  *CHILLS WITH OR WITHOUT FEVER  NAUSEA AND VOMITING THAT IS NOT CONTROLLED WITH YOUR NAUSEA MEDICATION  *UNUSUAL SHORTNESS OF BREATH  *UNUSUAL BRUISING OR BLEEDING  TENDERNESS IN MOUTH AND THROAT WITH OR WITHOUT PRESENCE OF ULCERS  *URINARY PROBLEMS  *BOWEL PROBLEMS  UNUSUAL RASH Items with * indicate a potential emergency and should be followed up as soon as possible.  Feel free to call the clinic you have any questions or concerns. The clinic phone number is [phone].  

## 2014-01-03 NOTE — Progress Notes (Signed)
INR = 2.7 Pt is on Coumadin 5 mg daily except 7.5 mg on Mondays. No complaints re: anticoag. No recent med changes. INR at goal.  No change to Coumadin dose required. Repeat protime in 3 weeks.  We can see him in the treatment room. Kennith Center, Pharm.D., CPP 01/03/2014@8 :53 AM

## 2014-01-04 ENCOUNTER — Ambulatory Visit (HOSPITAL_BASED_OUTPATIENT_CLINIC_OR_DEPARTMENT_OTHER): Payer: 59

## 2014-01-04 VITALS — BP 168/82 | HR 62 | Temp 98.1°F | Resp 20

## 2014-01-04 DIAGNOSIS — C9002 Multiple myeloma in relapse: Secondary | ICD-10-CM

## 2014-01-04 DIAGNOSIS — Z5112 Encounter for antineoplastic immunotherapy: Secondary | ICD-10-CM

## 2014-01-04 DIAGNOSIS — C9000 Multiple myeloma not having achieved remission: Secondary | ICD-10-CM

## 2014-01-04 MED ORDER — SODIUM CHLORIDE 0.9 % IJ SOLN
10.0000 mL | INTRAMUSCULAR | Status: DC | PRN
  Administered 2014-01-04: 10 mL
  Filled 2014-01-04: qty 10

## 2014-01-04 MED ORDER — ONDANSETRON 8 MG/50ML IVPB (CHCC)
8.0000 mg | Freq: Once | INTRAVENOUS | Status: AC
  Administered 2014-01-04: 8 mg via INTRAVENOUS

## 2014-01-04 MED ORDER — HEPARIN SOD (PORK) LOCK FLUSH 100 UNIT/ML IV SOLN
500.0000 [IU] | Freq: Once | INTRAVENOUS | Status: AC | PRN
  Administered 2014-01-04: 500 [IU]
  Filled 2014-01-04: qty 5

## 2014-01-04 MED ORDER — SODIUM CHLORIDE 0.9 % IV SOLN
Freq: Once | INTRAVENOUS | Status: AC
  Administered 2014-01-04: 09:00:00 via INTRAVENOUS

## 2014-01-04 MED ORDER — DEXAMETHASONE SODIUM PHOSPHATE 10 MG/ML IJ SOLN
10.0000 mg | Freq: Once | INTRAMUSCULAR | Status: AC
  Administered 2014-01-04: 10 mg via INTRAVENOUS

## 2014-01-04 MED ORDER — DEXTROSE 5 % IV SOLN
56.0000 mg/m2 | Freq: Once | INTRAVENOUS | Status: AC
  Administered 2014-01-04: 118 mg via INTRAVENOUS
  Filled 2014-01-04: qty 59

## 2014-01-04 MED ORDER — DEXAMETHASONE SODIUM PHOSPHATE 10 MG/ML IJ SOLN
INTRAMUSCULAR | Status: AC
  Filled 2014-01-04: qty 1

## 2014-01-04 MED ORDER — ONDANSETRON 8 MG/NS 50 ML IVPB
INTRAVENOUS | Status: AC
  Filled 2014-01-04: qty 8

## 2014-01-04 NOTE — Patient Instructions (Signed)
[facility] Discharge [facility] for Patients Receiving Chemotherapy  Today you received the following chemotherapy agents Kyprolis.  To help prevent nausea and vomiting after your treatment, we encourage you to take your nausea medication.   If you develop nausea and vomiting that is not controlled by your nausea medication, call the clinic.   BELOW ARE SYMPTOMS THAT SHOULD BE REPORTED IMMEDIATELY:  *FEVER GREATER THAN 100.5 F  *CHILLS WITH OR WITHOUT FEVER  NAUSEA AND VOMITING THAT IS NOT CONTROLLED WITH YOUR NAUSEA MEDICATION  *UNUSUAL SHORTNESS OF BREATH  *UNUSUAL BRUISING OR BLEEDING  TENDERNESS IN MOUTH AND THROAT WITH OR WITHOUT PRESENCE OF ULCERS  *URINARY PROBLEMS  *BOWEL PROBLEMS  UNUSUAL RASH Items with * indicate a potential emergency and should be followed up as soon as possible.  Feel free to call the clinic you have any questions or concerns. The clinic phone number is [phone].    

## 2014-01-04 NOTE — Telephone Encounter (Signed)
Sent to Dr Julien Nordmann for Josem Kaufmann.

## 2014-01-05 ENCOUNTER — Other Ambulatory Visit: Payer: Self-pay | Admitting: *Deleted

## 2014-01-05 DIAGNOSIS — C9000 Multiple myeloma not having achieved remission: Secondary | ICD-10-CM

## 2014-01-05 MED ORDER — POMALIDOMIDE 3 MG PO CAPS: 3.0000 mg | ORAL_CAPSULE | Freq: Every day | ORAL | Status: DC

## 2014-01-10 ENCOUNTER — Telehealth: Payer: Self-pay | Admitting: Internal Medicine

## 2014-01-10 ENCOUNTER — Ambulatory Visit (HOSPITAL_BASED_OUTPATIENT_CLINIC_OR_DEPARTMENT_OTHER): Payer: 59 | Admitting: Physician Assistant

## 2014-01-10 ENCOUNTER — Other Ambulatory Visit (HOSPITAL_BASED_OUTPATIENT_CLINIC_OR_DEPARTMENT_OTHER): Payer: 59

## 2014-01-10 ENCOUNTER — Ambulatory Visit (HOSPITAL_BASED_OUTPATIENT_CLINIC_OR_DEPARTMENT_OTHER): Payer: 59

## 2014-01-10 ENCOUNTER — Encounter: Payer: Self-pay | Admitting: Physician Assistant

## 2014-01-10 VITALS — BP 138/59 | HR 66 | Temp 97.9°F | Resp 20 | Ht 63.0 in | Wt 237.8 lb

## 2014-01-10 DIAGNOSIS — C9000 Multiple myeloma not having achieved remission: Secondary | ICD-10-CM

## 2014-01-10 DIAGNOSIS — Z5112 Encounter for antineoplastic immunotherapy: Secondary | ICD-10-CM

## 2014-01-10 DIAGNOSIS — C9002 Multiple myeloma in relapse: Secondary | ICD-10-CM

## 2014-01-10 LAB — CBC WITH DIFFERENTIAL/PLATELET
BASO%: 0.2 % (ref 0.0–2.0)
Basophils Absolute: 0 10*3/uL (ref 0.0–0.1)
EOS%: 5.8 % (ref 0.0–7.0)
Eosinophils Absolute: 0.3 10*3/uL (ref 0.0–0.5)
HCT: 31.3 % — ABNORMAL LOW (ref 38.4–49.9)
HEMOGLOBIN: 9.8 g/dL — AB (ref 13.0–17.1)
LYMPH%: 13.5 % — ABNORMAL LOW (ref 14.0–49.0)
MCH: 30.5 pg (ref 27.2–33.4)
MCHC: 31.3 g/dL — ABNORMAL LOW (ref 32.0–36.0)
MCV: 97.5 fL (ref 79.3–98.0)
MONO#: 0.8 10*3/uL (ref 0.1–0.9)
MONO%: 17.9 % — ABNORMAL HIGH (ref 0.0–14.0)
NEUT#: 2.8 10*3/uL (ref 1.5–6.5)
NEUT%: 62.6 % (ref 39.0–75.0)
PLATELETS: 150 10*3/uL (ref 140–400)
RBC: 3.21 10*6/uL — ABNORMAL LOW (ref 4.20–5.82)
RDW: 21 % — AB (ref 11.0–14.6)
WBC: 4.5 10*3/uL (ref 4.0–10.3)
lymph#: 0.6 10*3/uL — ABNORMAL LOW (ref 0.9–3.3)
nRBC: 1 % — ABNORMAL HIGH (ref 0–0)

## 2014-01-10 LAB — COMPREHENSIVE METABOLIC PANEL (CC13)
ALK PHOS: 72 U/L (ref 40–150)
ALT: 50 U/L (ref 0–55)
AST: 12 U/L (ref 5–34)
Albumin: 3.2 g/dL — ABNORMAL LOW (ref 3.5–5.0)
Anion Gap: 9 mEq/L (ref 3–11)
BUN: 12.5 mg/dL (ref 7.0–26.0)
CO2: 22 mEq/L (ref 22–29)
CREATININE: 0.7 mg/dL (ref 0.7–1.3)
Calcium: 8.9 mg/dL (ref 8.4–10.4)
Chloride: 110 mEq/L — ABNORMAL HIGH (ref 98–109)
Glucose: 83 mg/dl (ref 70–140)
Potassium: 4 mEq/L (ref 3.5–5.1)
SODIUM: 141 meq/L (ref 136–145)
Total Bilirubin: 1.1 mg/dL (ref 0.20–1.20)
Total Protein: 6.3 g/dL — ABNORMAL LOW (ref 6.4–8.3)

## 2014-01-10 MED ORDER — DEXTROSE 5 % IV SOLN
56.0000 mg/m2 | Freq: Once | INTRAVENOUS | Status: AC
  Administered 2014-01-10: 118 mg via INTRAVENOUS
  Filled 2014-01-10: qty 59

## 2014-01-10 MED ORDER — DEXAMETHASONE SODIUM PHOSPHATE 10 MG/ML IJ SOLN
10.0000 mg | Freq: Once | INTRAMUSCULAR | Status: AC
  Administered 2014-01-10: 10 mg via INTRAVENOUS

## 2014-01-10 MED ORDER — DEXTROSE 5 % IV SOLN: 56.0000 mg/m2 | Freq: Once | INTRAVENOUS | Status: DC

## 2014-01-10 MED ORDER — SODIUM CHLORIDE 0.9 % IJ SOLN
10.0000 mL | INTRAMUSCULAR | Status: DC | PRN
  Administered 2014-01-10: 10 mL
  Filled 2014-01-10: qty 10

## 2014-01-10 MED ORDER — ONDANSETRON 8 MG/50ML IVPB (CHCC)
8.0000 mg | Freq: Once | INTRAVENOUS | Status: AC
  Administered 2014-01-10: 8 mg via INTRAVENOUS

## 2014-01-10 MED ORDER — HEPARIN SOD (PORK) LOCK FLUSH 100 UNIT/ML IV SOLN
500.0000 [IU] | Freq: Once | INTRAVENOUS | Status: AC | PRN
  Administered 2014-01-10: 500 [IU]
  Filled 2014-01-10: qty 5

## 2014-01-10 MED ORDER — DEXAMETHASONE SODIUM PHOSPHATE 10 MG/ML IJ SOLN
INTRAMUSCULAR | Status: AC
  Filled 2014-01-10: qty 1

## 2014-01-10 MED ORDER — SODIUM CHLORIDE 0.9 % IV SOLN
Freq: Once | INTRAVENOUS | Status: AC
  Administered 2014-01-10: 12:00:00 via INTRAVENOUS

## 2014-01-10 MED ORDER — ONDANSETRON 8 MG/NS 50 ML IVPB
INTRAVENOUS | Status: AC
  Filled 2014-01-10: qty 8

## 2014-01-10 NOTE — Patient Instructions (Signed)
Return in one week to have repeat protein studies drawn to reevaluate her Followup with Dr. Julien Nordmann in 2 weeks to discuss the results of those studies.

## 2014-01-10 NOTE — Progress Notes (Signed)
[place] [phone]   [phone]  SHARED VISIT PROGRESS NOTE  [name]., MD [address] [place] [address]  Principle Diagnosis:  #1 recurrent multiple myeloma IgG kappa subtype diagnosed in June of 2008  #2 history of vasculitis and thrombosis of skin lesions   Prior Therapy:  #1 status post palliative radiotherapy to the left hip under the care of Dr. Sondra Come  #2 status post 5 cycles of systemic chemotherapy with Revlimid and low dose Decadron. Last dose given June 2009 with good response.  #3 status post autologous peripheral blood stem cell transplant at [facility] on 02/02/2008.  #4 the patient had evidence for disease recurrence in December 2010.  #5 Revlimid 25 mg by mouth daily for 21 days every 4 weeks in addition to Decadron 40 mg orally on a weekly basis. The patient is status post 28 cycles, discontinued today secondary to disease progression.  #6 Systemic chemotherapy with Velcade 1.3 mg/M2 on days 1, 4, 8 and 11 in addition to Doxil 30 mg/M2 on day 4 and Decadron 40 mg by mouth on weekly basis every 3 weeks. Status post 3 cycles, last dose was given 05/04/2012 discontinued secondary to intolerance.  #7 Salvage therapy treatment with the Pomalyst 4 mg by mouth daily for 21 days every 28 days as well as Cytoxan 50 mg by mouth every other day and dexamethasone 40 mg by mouth once weekly. Therapy beginning 09/25/2012, status post 2 cycles discontinued secondary to disease progression and intolerance.  #8 Kyprolis (Carfilzomib) 27 mg/M2 on days 1, 2, 8, 9, 15 and 16 every 4 weeks. First dose on 02/08/2013. This would be concurrent with the dexamethasone 40 mg by mouth on a weekly basis. Status post 3 cycles with disease progression.  #9 Systemic chemotherapy with Carfilzomib 27 mg/M2 days 1,2 , 8, 9, 15 and 16, Cytoxan 300 mg/M2 and dexamethasone 40 mg by mouth weekly every 4 weeks. Status post 4 cycles.  Last dose was given 08/09/2013 discontinued today secondary to disease progression.  Current therapy:  1) Systemic chemotherapy with Carfilzomib 56 mg/M2 days 1,2 , 8, 9, 15 and 16, Pomalyst 3 mg by mouth daily for 21 days every 4 weeks and dexamethasone 40 mg by mouth weekly every 4 weeks. First cycle started on 10/05/2013. The patient missed day 8 and 9 of the first cycle secondary to thrombocytopenia. Status post 3 cycles. He is also status post days 1, 2, 8 and 9 of cycle #4 2) Zometa 4 mg IV given every 3 months.    INTERVAL HISTORY: [name] 53 y.o. male returns to the clinic today for a work in visit accompanied by his wife. He presents to proceed with days 15 and 16 of cycle #4. Today he is feeling well and voices no specific complaints. He does request a refill for his dexamethasone tablets.  He reports he is drinking a little more water but continues drinking a fair amount of soda, coffee and juices.  He denies any fever, chills, diarrhea or constipation.  He denied having any other significant complaints today. He has no significant fatigue or weakness. He has no chest pain, shortness of breath, cough or hemoptysis. The patient denied having any nausea or vomiting. He has no weight loss or night sweats. Overall he is tolerating his current chemotherapy without difficulty.  MEDICAL HISTORY: Past Medical History  Diagnosis Date  . Multiple myeloma 09/24/2011  . Hypertension  ALLERGIES:  is allergic to red dye and other.  MEDICATIONS:  Current Outpatient Prescriptions  Medication Sig Dispense Refill  . amLODipine (NORVASC) 10 MG tablet Take 10 mg by mouth daily.      . benzonatate (TESSALON) 200 MG capsule Take 200 mg by mouth 3 (three) times daily as needed for cough.       . chlorpheniramine-HYDROcodone (TUSSIONEX) 10-8 MG/5ML LQCR Take 5 mLs by mouth every 12 (twelve) hours as needed for cough.  115 mL  0  . dexamethasone (DECADRON) 4 MG tablet Take 10 tablets (40 mg  total) by mouth once a week. Mondays with chemo  40 tablet  2  . furosemide (LASIX) 20 MG tablet Take 72m (1 tablet) daily, alternating with 461m(2 tablets)  every other day.  45 tablet  0  . HYDROcodone-acetaminophen (NORCO) 10-325 MG per tablet Take 1-2 tablets by mouth every 6 (six) hours as needed. For pain.  40 tablet  0  . lidocaine-prilocaine (EMLA) cream Apply 1 application topically as needed.  30 g  0  . morphine (MS CONTIN) 30 MG 12 hr tablet Take 30 mg by mouth every 12 (twelve) hours as needed for pain.      . Nebivolol HCl (BYSTOLIC) 20 MG TABS Take 30 mg by mouth daily.      . Marland Kitchenmeprazole (PRILOSEC) 40 MG capsule Take 40 mg by mouth daily as needed (for heartburn).       . Marland KitchenxyCODONE-acetaminophen (PERCOCET/ROXICET) 5-325 MG per tablet Take 1 tablet by mouth every 6 (six) hours as needed for moderate pain.      . pomalidomide (POMALYST) 3 MG capsule Take 1 capsule (3 mg total) by mouth daily. Take with water on days 1-21. Repeat every 28 days.   AUTH # [id number]  21 capsule  0  . potassium chloride SA (K-DUR,KLOR-CON) 20 MEQ tablet TAKE 1 TABLET BY MOUTH EVERY DAY  30 tablet  0  . pregabalin (LYRICA) 50 MG capsule Take 1 capsule (50 mg total) by mouth 3 (three) times daily.  270 capsule  1  . PRESCRIPTION MEDICATION [facility] Dr. MoEarlie Server    . prochlorperazine (COMPAZINE) 10 MG tablet Take 10 mg by mouth every 6 (six) hours as needed for nausea.      . Marland Kitchenarfarin (COUMADIN) 5 MG tablet Take 1-1.5 tablets (5-7.5 mg total) by mouth daily. Take 5 mg by mouth daily except 7.5 mg on Mondays  50 tablet  1   No current facility-administered medications for this visit.   Facility-Administered Medications Ordered in Other Visits  Medication Dose Route Frequency Provider Last Rate Last Dose  . sodium chloride 0.9 % injection 10 mL  10 mL Intracatheter PRN MoCurt BearsMD   10 mL at 07/27/13 1617  . sodium chloride 0.9 % injection 10 mL  10 mL  Intracatheter PRN AdCarlton AdamPA-C   10 mL at 01/10/14 1311    SURGICAL HISTORY:  Past Surgical History  Procedure Laterality Date  . Video bronchoscopy  12/11/2012    Procedure: VIDEO BRONCHOSCOPY;  Surgeon: PeIvin PootMD;  [facility];  Laterality: N/A;  . Video assisted thoracoscopy  12/11/2012    Procedure: VIDEO ASSISTED THORACOSCOPY;  Surgeon: PeIvin PootMD;  [facility];  Laterality: Right;  . Decortication  12/11/2012    Procedure: DECORTICATION;  Surgeon: PeIvin PootMD;  [facility] [facility]:  Thoracic;  Laterality: N/A;    REVIEW OF SYSTEMS:  Constitutional: negative Eyes: negative Ears, nose, mouth, throat, and face: negative Respiratory: negative Cardiovascular: positive for lower extremity edema Gastrointestinal: negative Genitourinary:negative Integument/breast: negative Hematologic/lymphatic: negative Musculoskeletal:negative Neurological: negative Behavioral/Psych: negative Endocrine: negative Allergic/Immunologic: negative   PHYSICAL EXAMINATION: General appearance: alert, cooperative, fatigued and no distress Head: Normocephalic, without obvious abnormality, atraumatic, Nontender Neck: no adenopathy, no JVD, supple, symmetrical, trachea midline and thyroid not enlarged, symmetric, no tenderness/mass/nodules Lymph nodes: Cervical, supraclavicular, and axillary nodes normal. Resp: clear to auscultation bilaterally Back: symmetric, no curvature. ROM normal. No CVA tenderness. Cardio: regular rate and rhythm, S1, S2 normal, no murmur, click, rub or gallop GI: soft, non-tender; bowel sounds normal; no masses,  no organomegaly Extremities: edema trace - 1+ pitting edema bilateral lower extremities Neurologic: Alert and oriented X 3, normal strength and tone. Normal symmetric reflexes. Normal coordination and gait  ECOG PERFORMANCE STATUS: 1 - Symptomatic but completely ambulatory  Blood  pressure 138/59, pulse 66, temperature 97.9 F (36.6 C), temperature source Oral, resp. rate 20, height '5\' 3"'  (1.6 m), weight 237 lb 12.8 oz (107.865 kg).  LABORATORY DATA: Lab Results  Component Value Date   WBC 4.5 01/10/2014   HGB 9.8* 01/10/2014   HCT 31.3* 01/10/2014   MCV 97.5 01/10/2014   PLT 150 01/10/2014      Chemistry      Component Value Date/Time   NA 141 01/10/2014 0956   NA 136 03/16/2013 1930   K 4.0 01/10/2014 0956   K 3.8 03/16/2013 1930   CL 112* 05/10/2013 0849   CL 110 03/16/2013 1930   CO2 22 01/10/2014 0956   CO2 16* 03/16/2013 1930   BUN 12.5 01/10/2014 0956   BUN 22 03/16/2013 1930   CREATININE 0.7 01/10/2014 0956   CREATININE 1.24 03/16/2013 1930      Component Value Date/Time   CALCIUM 8.9 01/10/2014 0956   CALCIUM 8.2* 03/16/2013 1930   ALKPHOS 72 01/10/2014 0956   ALKPHOS 31* 03/16/2013 1930   AST 12 01/10/2014 0956   AST 11 03/16/2013 1930   ALT 50 01/10/2014 0956   ALT 15 03/16/2013 1930   BILITOT 1.10 01/10/2014 0956   BILITOT 0.2* 03/16/2013 1930     Labs from 11/25/2013 are as follows: Beta-2 microglobin 2.58, previously 5.77 on 08/30/2013, kappa free light chain 7.32, previously 156 on 08/30/2013, lambda free light chain 1.00 previously 0.27,: Lambda ratio 7.32 previously 577.78, IgG 843 previously 4940, IgA 76, previously less than 7, IgM 19, previously less than 4, total protein, serum 6.3 previously 11.3 on 01/06/2013.   ASSESSMENT AND PLAN: The patient is a very pleasant 53 years old African American male with recurrent multiple myeloma currently on treatment with Carfilzomib 56 mg/M2, Pomalyst 3 mg by mouth daily for 21 days every 3 weeks in addition to Decadron 40 mg on a weekly basis. He is status post 3 cycles.  The patient's last protein studies show significant improvement in his disease. Patient was discussed with also seen by Dr. Julien Nordmann. Review of the patient's medications reveal that he actually has a prescription for dexamethasone was written on  12/20/2012 for 40 tablets with 2 refills. This prescription are to be at his pharmacy.  He will proceed with days #15 and 16 of cycle #4 of his chemotherapy today as scheduled. He'll return in one week for a lab only visit for repeat protein studies to reevaluate his disease and followup with Dr. Julien Nordmann in 2 weeks to  discuss the results of those studies on when he returns for day 1 of cycle #5. He'll followup in 2 weeks for another symptom management visit with a CBC differential and C. met.   He was advised to call immediately if he has any concerning symptoms in the interval.  He was advised to call immediately if she has any concerning symptoms in the interval. The patient voices understanding of current disease status and treatment options and is in agreement with the current care plan.  All questions were answered. The patient knows to call the clinic with any problems, questions or concerns. We can certainly see the patient much sooner if necessary.    Carlton Adam, PA-C 01/10/2014  ADDENDUM:  Hematology/Oncology Attending:  I had a face to face encounter with the patient today. I recommended his care plan. This is a very pleasant 53 years old African American male with relapsed multiple myeloma currently on treatment with Carfilzomib, Pomalyst and Decadron status post 3 cycles and he is currently undergoing cycle #4. He is tolerating his treatment fairly well with no significant adverse effects. We'll proceed with day 15 and 16 of his chemotherapy this week as scheduled. He would come back for follow up visit in 2 weeks after repeating myeloma panel for reevaluation of his disease. He was advised to call immediately if he has any concerning symptoms in the interval.  Disclaimer: This note was dictated with voice recognition software. Similar sounding words can inadvertently be transcribed and may not be corrected upon review. Eilleen Kempf., MD 01/10/2014

## 2014-01-10 NOTE — Telephone Encounter (Signed)
gv adn printed appt sched and avs fo rpt for Feb adn March..sed added tx.... °

## 2014-01-11 ENCOUNTER — Ambulatory Visit (HOSPITAL_BASED_OUTPATIENT_CLINIC_OR_DEPARTMENT_OTHER): Payer: 59

## 2014-01-11 VITALS — BP 146/74 | HR 63 | Temp 97.6°F | Resp 20

## 2014-01-11 DIAGNOSIS — C9002 Multiple myeloma in relapse: Secondary | ICD-10-CM

## 2014-01-11 DIAGNOSIS — C9000 Multiple myeloma not having achieved remission: Secondary | ICD-10-CM

## 2014-01-11 DIAGNOSIS — Z5112 Encounter for antineoplastic immunotherapy: Secondary | ICD-10-CM

## 2014-01-11 MED ORDER — DEXAMETHASONE SODIUM PHOSPHATE 10 MG/ML IJ SOLN
INTRAMUSCULAR | Status: AC
  Filled 2014-01-11: qty 1

## 2014-01-11 MED ORDER — SODIUM CHLORIDE 0.9 % IV SOLN: Freq: Once | INTRAVENOUS | Status: AC

## 2014-01-11 MED ORDER — SODIUM CHLORIDE 0.9 % IJ SOLN
10.0000 mL | INTRAMUSCULAR | Status: AC | PRN
Start: 2014-01-11 — End: ?
  Filled 2014-01-11: qty 10

## 2014-01-11 MED ORDER — DEXAMETHASONE SODIUM PHOSPHATE 10 MG/ML IJ SOLN
10.0000 mg | Freq: Once | INTRAMUSCULAR | Status: AC
  Administered 2014-01-11: 10 mg via INTRAVENOUS

## 2014-01-11 MED ORDER — HEPARIN SOD (PORK) LOCK FLUSH 100 UNIT/ML IV SOLN
500.0000 [IU] | Freq: Once | INTRAVENOUS | Status: AC | PRN
  Filled 2014-01-11: qty 5

## 2014-01-11 MED ORDER — ONDANSETRON 8 MG/NS 50 ML IVPB
INTRAVENOUS | Status: AC
  Filled 2014-01-11: qty 8

## 2014-01-11 MED ORDER — SODIUM CHLORIDE 0.9 % IV SOLN
Freq: Once | INTRAVENOUS | Status: AC
  Administered 2014-01-11: 09:00:00 via INTRAVENOUS

## 2014-01-11 MED ORDER — ONDANSETRON 8 MG/50ML IVPB (CHCC)
8.0000 mg | Freq: Once | INTRAVENOUS | Status: AC
  Administered 2014-01-11: 8 mg via INTRAVENOUS

## 2014-01-11 MED ORDER — DEXTROSE 5 % IV SOLN
56.0000 mg/m2 | Freq: Once | INTRAVENOUS | Status: AC
  Administered 2014-01-11: 118 mg via INTRAVENOUS
  Filled 2014-01-11: qty 59

## 2014-01-11 MED ORDER — HEPARIN SOD (PORK) LOCK FLUSH 100 UNIT/ML IV SOLN
250.0000 [IU] | Freq: Once | INTRAVENOUS | Status: AC | PRN
  Filled 2014-01-11: qty 5

## 2014-01-17 ENCOUNTER — Telehealth: Payer: Self-pay | Admitting: *Deleted

## 2014-01-17 ENCOUNTER — Other Ambulatory Visit (HOSPITAL_BASED_OUTPATIENT_CLINIC_OR_DEPARTMENT_OTHER): Payer: 59

## 2014-01-17 DIAGNOSIS — C9000 Multiple myeloma not having achieved remission: Secondary | ICD-10-CM

## 2014-01-17 DIAGNOSIS — C9002 Multiple myeloma in relapse: Secondary | ICD-10-CM

## 2014-01-17 LAB — CBC WITH DIFFERENTIAL/PLATELET
BASO%: 1.1 % (ref 0.0–2.0)
BASOS ABS: 0 10*3/uL (ref 0.0–0.1)
EOS%: 7.2 % — AB (ref 0.0–7.0)
Eosinophils Absolute: 0.2 10*3/uL (ref 0.0–0.5)
HCT: 31.1 % — ABNORMAL LOW (ref 38.4–49.9)
HEMOGLOBIN: 10 g/dL — AB (ref 13.0–17.1)
LYMPH#: 0.2 10*3/uL — AB (ref 0.9–3.3)
LYMPH%: 6.8 % — ABNORMAL LOW (ref 14.0–49.0)
MCH: 30.8 pg (ref 27.2–33.4)
MCHC: 32.3 g/dL (ref 32.0–36.0)
MCV: 95.5 fL (ref 79.3–98.0)
MONO#: 0.8 10*3/uL (ref 0.1–0.9)
MONO%: 32.8 % — ABNORMAL HIGH (ref 0.0–14.0)
NEUT#: 1.3 10*3/uL — ABNORMAL LOW (ref 1.5–6.5)
NEUT%: 52.1 % (ref 39.0–75.0)
Platelets: 119 10*3/uL — ABNORMAL LOW (ref 140–400)
RBC: 3.26 10*6/uL — ABNORMAL LOW (ref 4.20–5.82)
RDW: 22.4 % — ABNORMAL HIGH (ref 11.0–14.6)
WBC: 2.6 10*3/uL — ABNORMAL LOW (ref 4.0–10.3)

## 2014-01-17 LAB — COMPREHENSIVE METABOLIC PANEL (CC13)
ALBUMIN: 3 g/dL — AB (ref 3.5–5.0)
ALT: 26 U/L (ref 0–55)
ANION GAP: 9 meq/L (ref 3–11)
AST: 19 U/L (ref 5–34)
Alkaline Phosphatase: 50 U/L (ref 40–150)
BUN: 17.9 mg/dL (ref 7.0–26.0)
CALCIUM: 8.3 mg/dL — AB (ref 8.4–10.4)
CO2: 19 meq/L — AB (ref 22–29)
Chloride: 110 mEq/L — ABNORMAL HIGH (ref 98–109)
Creatinine: 1 mg/dL (ref 0.7–1.3)
GLUCOSE: 111 mg/dL (ref 70–140)
POTASSIUM: 3.3 meq/L — AB (ref 3.5–5.1)
SODIUM: 138 meq/L (ref 136–145)
TOTAL PROTEIN: 6.3 g/dL — AB (ref 6.4–8.3)
Total Bilirubin: 1.54 mg/dL — ABNORMAL HIGH (ref 0.20–1.20)

## 2014-01-17 LAB — TECHNOLOGIST REVIEW

## 2014-01-17 LAB — LACTATE DEHYDROGENASE (CC13): LDH: 415 U/L — AB (ref 125–245)

## 2014-01-17 MED ORDER — BENZONATATE 200 MG PO CAPS: 200.0000 mg | ORAL_CAPSULE | Freq: Three times a day (TID) | ORAL | Status: DC | PRN

## 2014-01-17 MED ORDER — AZITHROMYCIN 250 MG PO TABS: ORAL_TABLET | ORAL | Status: DC

## 2014-01-17 NOTE — Telephone Encounter (Signed)
Pt came in for labs. States he had a fever yesterday 102.5 and has been taking tylenol. Has been coughing and weak. Taking fluids Requested tessalon for cough. Dr Julien Nordmann wants patient on Zpak. Pt informed.

## 2014-01-18 LAB — KAPPA/LAMBDA LIGHT CHAINS
Kappa free light chain: 29.8 mg/dL — ABNORMAL HIGH (ref 0.33–1.94)
Kappa:Lambda Ratio: 56.23 — ABNORMAL HIGH (ref 0.26–1.65)
Lambda Free Lght Chn: 0.53 mg/dL — ABNORMAL LOW (ref 0.57–2.63)

## 2014-01-18 LAB — BETA 2 MICROGLOBULIN, SERUM: Beta-2 Microglobulin: 4.99 mg/L — ABNORMAL HIGH (ref 1.01–1.73)

## 2014-01-18 LAB — IGG, IGA, IGM
IGA: 23 mg/dL — AB (ref 68–379)
IGG (IMMUNOGLOBIN G), SERUM: 481 mg/dL — AB (ref 650–1600)
IGM, SERUM: 19 mg/dL — AB (ref 41–251)

## 2014-01-21 ENCOUNTER — Other Ambulatory Visit: Payer: Self-pay | Admitting: *Deleted

## 2014-01-24 ENCOUNTER — Encounter: Payer: Self-pay | Admitting: Internal Medicine

## 2014-01-24 ENCOUNTER — Other Ambulatory Visit: Payer: 59

## 2014-01-24 ENCOUNTER — Ambulatory Visit (HOSPITAL_BASED_OUTPATIENT_CLINIC_OR_DEPARTMENT_OTHER): Payer: 59

## 2014-01-24 ENCOUNTER — Other Ambulatory Visit (HOSPITAL_BASED_OUTPATIENT_CLINIC_OR_DEPARTMENT_OTHER): Payer: 59

## 2014-01-24 ENCOUNTER — Telehealth: Payer: Self-pay | Admitting: Internal Medicine

## 2014-01-24 ENCOUNTER — Ambulatory Visit: Payer: 59 | Admitting: Pharmacist

## 2014-01-24 ENCOUNTER — Other Ambulatory Visit: Payer: Self-pay | Admitting: *Deleted

## 2014-01-24 ENCOUNTER — Ambulatory Visit (HOSPITAL_BASED_OUTPATIENT_CLINIC_OR_DEPARTMENT_OTHER): Payer: 59 | Admitting: Internal Medicine

## 2014-01-24 VITALS — BP 147/71 | HR 59 | Temp 98.4°F | Resp 20 | Ht 63.0 in | Wt 222.5 lb

## 2014-01-24 DIAGNOSIS — I776 Arteritis, unspecified: Secondary | ICD-10-CM

## 2014-01-24 DIAGNOSIS — Z86718 Personal history of other venous thrombosis and embolism: Secondary | ICD-10-CM

## 2014-01-24 DIAGNOSIS — Z5112 Encounter for antineoplastic immunotherapy: Secondary | ICD-10-CM

## 2014-01-24 DIAGNOSIS — C9002 Multiple myeloma in relapse: Secondary | ICD-10-CM

## 2014-01-24 DIAGNOSIS — C9000 Multiple myeloma not having achieved remission: Secondary | ICD-10-CM

## 2014-01-24 LAB — COMPREHENSIVE METABOLIC PANEL (CC13)
ALK PHOS: 58 U/L (ref 40–150)
ALT: 28 U/L (ref 0–55)
ANION GAP: 10 meq/L (ref 3–11)
AST: 18 U/L (ref 5–34)
Albumin: 3 g/dL — ABNORMAL LOW (ref 3.5–5.0)
BUN: 10 mg/dL (ref 7.0–26.0)
CO2: 26 meq/L (ref 22–29)
CREATININE: 0.7 mg/dL (ref 0.7–1.3)
Calcium: 9.2 mg/dL (ref 8.4–10.4)
Chloride: 108 mEq/L (ref 98–109)
GLUCOSE: 81 mg/dL (ref 70–140)
Potassium: 3.7 mEq/L (ref 3.5–5.1)
SODIUM: 144 meq/L (ref 136–145)
TOTAL PROTEIN: 6.5 g/dL (ref 6.4–8.3)
Total Bilirubin: 0.61 mg/dL (ref 0.20–1.20)

## 2014-01-24 LAB — CBC WITH DIFFERENTIAL/PLATELET
BASO%: 0.9 % (ref 0.0–2.0)
Basophils Absolute: 0.1 10*3/uL (ref 0.0–0.1)
EOS%: 3.2 % (ref 0.0–7.0)
Eosinophils Absolute: 0.2 10*3/uL (ref 0.0–0.5)
HEMATOCRIT: 32.5 % — AB (ref 38.4–49.9)
HGB: 10.3 g/dL — ABNORMAL LOW (ref 13.0–17.1)
LYMPH%: 19.3 % (ref 14.0–49.0)
MCH: 29.8 pg (ref 27.2–33.4)
MCHC: 31.7 g/dL — AB (ref 32.0–36.0)
MCV: 93.9 fL (ref 79.3–98.0)
MONO#: 0.8 10*3/uL (ref 0.1–0.9)
MONO%: 15.9 % — AB (ref 0.0–14.0)
NEUT#: 3.2 10*3/uL (ref 1.5–6.5)
NEUT%: 60.7 % (ref 39.0–75.0)
Platelets: 491 10*3/uL — ABNORMAL HIGH (ref 140–400)
RBC: 3.46 10*6/uL — AB (ref 4.20–5.82)
RDW: 20.2 % — ABNORMAL HIGH (ref 11.0–14.6)
WBC: 5.3 10*3/uL (ref 4.0–10.3)
lymph#: 1 10*3/uL (ref 0.9–3.3)

## 2014-01-24 LAB — PROTIME-INR
INR: 2.4 (ref 2.00–3.50)
PROTIME: 28.8 s — AB (ref 10.6–13.4)

## 2014-01-24 LAB — POCT INR: INR: 2.4

## 2014-01-24 MED ORDER — BENZONATATE 200 MG PO CAPS: 200.0000 mg | ORAL_CAPSULE | Freq: Three times a day (TID) | ORAL | Status: DC | PRN

## 2014-01-24 MED ORDER — ONDANSETRON 8 MG/NS 50 ML IVPB
INTRAVENOUS | Status: AC
  Filled 2014-01-24: qty 8

## 2014-01-24 MED ORDER — SODIUM CHLORIDE 0.9 % IJ SOLN
10.0000 mL | INTRAMUSCULAR | Status: DC | PRN
  Administered 2014-01-24: 10 mL
  Filled 2014-01-24: qty 10

## 2014-01-24 MED ORDER — HYDROCOD POLST-CHLORPHEN POLST 10-8 MG/5ML PO LQCR: 5.0000 mL | Freq: Two times a day (BID) | ORAL | Status: DC | PRN

## 2014-01-24 MED ORDER — DEXAMETHASONE SODIUM PHOSPHATE 10 MG/ML IJ SOLN
INTRAMUSCULAR | Status: AC
  Filled 2014-01-24: qty 1

## 2014-01-24 MED ORDER — WARFARIN SODIUM 5 MG PO TABS: 5.0000 mg | ORAL_TABLET | Freq: Every day | ORAL | Status: DC

## 2014-01-24 MED ORDER — SODIUM CHLORIDE 0.9 % IV SOLN
Freq: Once | INTRAVENOUS | Status: AC
  Administered 2014-01-24: 10:00:00 via INTRAVENOUS

## 2014-01-24 MED ORDER — HEPARIN SOD (PORK) LOCK FLUSH 100 UNIT/ML IV SOLN
500.0000 [IU] | Freq: Once | INTRAVENOUS | Status: AC | PRN
  Administered 2014-01-24: 500 [IU]
  Filled 2014-01-24: qty 5

## 2014-01-24 MED ORDER — DEXAMETHASONE SODIUM PHOSPHATE 10 MG/ML IJ SOLN
10.0000 mg | Freq: Once | INTRAMUSCULAR | Status: AC
  Administered 2014-01-24: 10 mg via INTRAVENOUS

## 2014-01-24 MED ORDER — DEXTROSE 5 % IV SOLN
56.0000 mg/m2 | Freq: Once | INTRAVENOUS | Status: AC
  Administered 2014-01-24: 118 mg via INTRAVENOUS
  Filled 2014-01-24: qty 59

## 2014-01-24 MED ORDER — ONDANSETRON 8 MG/50ML IVPB (CHCC)
8.0000 mg | Freq: Once | INTRAVENOUS | Status: AC
  Administered 2014-01-24: 8 mg via INTRAVENOUS

## 2014-01-24 NOTE — Progress Notes (Signed)
INR within goal today. Hg/Hct: 10.3/32.5, Pltc: 491 Pt completed a Z-pak on 01/23/14. No other medication changes. Pt eating less, decreased appetite - doesn't feel like eating. He is losing weight. No concerns regarding anticoagulation. Continue 5 mg daily except for 7.5 mg on Mondays.   Recheck PT/INR with next scheduled lab/appointments on 02/07/14; lab at 8:15am, treatment at 8:45am and coumadin clinic at [facility].

## 2014-01-24 NOTE — Telephone Encounter (Signed)
gv wife appt schedule for march and april.

## 2014-01-24 NOTE — Patient Instructions (Signed)
Cancer Center Discharge Instructions for Patients Receiving Chemotherapy  Today you received the following chemotherapy agents Kyprolis To help prevent nausea and vomiting after your treatment, we encourage you to take your nausea medication as prescribed.  If you develop nausea and vomiting that is not controlled by your nausea medication, call the clinic.   BELOW ARE SYMPTOMS THAT SHOULD BE REPORTED IMMEDIATELY:  *FEVER GREATER THAN 100.5 F  *CHILLS WITH OR WITHOUT FEVER  NAUSEA AND VOMITING THAT IS NOT CONTROLLED WITH YOUR NAUSEA MEDICATION  *UNUSUAL SHORTNESS OF BREATH  *UNUSUAL BRUISING OR BLEEDING  TENDERNESS IN MOUTH AND THROAT WITH OR WITHOUT PRESENCE OF ULCERS  *URINARY PROBLEMS  *BOWEL PROBLEMS  UNUSUAL RASH Items with * indicate a potential emergency and should be followed up as soon as possible.  Feel free to call the clinic you have any questions or concerns. The clinic phone number is [phone].    

## 2014-01-24 NOTE — Progress Notes (Signed)
[facility] [phone]   [phone]) [phone]  OFFICE VISIT PROGRESS NOTE  [name]., MD [address] [place] [address]  Principle Diagnosis:  #1 recurrent multiple myeloma IgG kappa subtype diagnosed in June of 2008  #2 history of vasculitis and thrombosis of skin lesions   Prior Therapy:  #1 status post palliative radiotherapy to the left hip under the care of Dr. Sondra Come  #2 status post 5 cycles of systemic chemotherapy with Revlimid and low dose Decadron. Last dose given June 2009 with good response.  #3 status post autologous peripheral blood stem cell transplant at [facility] on 02/02/2008.  #4 the patient had evidence for disease recurrence in December 2010.  #5 Revlimid 25 mg by mouth daily for 21 days every 4 weeks in addition to Decadron 40 mg orally on a weekly basis. The patient is status post 28 cycles, discontinued today secondary to disease progression.  #6 Systemic chemotherapy with Velcade 1.3 mg/M2 on days 1, 4, 8 and 11 in addition to Doxil 30 mg/M2 on day 4 and Decadron 40 mg by mouth on weekly basis every 3 weeks. Status post 3 cycles, last dose was given 05/04/2012 discontinued secondary to intolerance.  #7 Salvage therapy treatment with the Pomalyst 4 mg by mouth daily for 21 days every 28 days as well as Cytoxan 50 mg by mouth every other day and dexamethasone 40 mg by mouth once weekly. Therapy beginning 09/25/2012, status post 2 cycles discontinued secondary to disease progression and intolerance.  #8 Kyprolis (Carfilzomib) 27 mg/M2 on days 1, 2, 8, 9, 15 and 16 every 4 weeks. First dose on 02/08/2013. This would be concurrent with the dexamethasone 40 mg by mouth on a weekly basis. Status post 3 cycles with disease progression.  #9 Systemic chemotherapy with Carfilzomib 27 mg/M2 days 1,2 , 8, 9, 15 and 16, Cytoxan 300 mg/M2 and dexamethasone 40 mg by mouth weekly every 4 weeks. Status post 4  cycles. Last dose was given 08/09/2013 discontinued today secondary to disease progression.  Current therapy:  1) Systemic chemotherapy with Carfilzomib 56 mg/M2 days 1,2 , 8, 9, 15 and 16, Pomalyst 3 mg by mouth daily for 21 days every 4 weeks and dexamethasone 40 mg by mouth weekly every 4 weeks. First cycle started on 10/05/2013. The patient missed day 8 and 9 of the first cycle secondary to thrombocytopenia. Status post 4 cycles.  2) Zometa 4 mg IV given every 3 months.    INTERVAL HISTORY: [name] 53 y.o. male returns to the clinic today for a work in visit accompanied by his wife. The patient has been complaining recently of chest congestion with cough productive of yellowish sputum.  He just completed a course of treatment with Z PAK with some improvement in his condition. He denies any fever, chills, diarrhea or constipation.  He denied having any other significant complaints today. He has no significant fatigue or weakness. He has no chest pain, shortness of breath, cough or hemoptysis. The patient denied having any nausea or vomiting. He has no weight loss or night sweats. Overall he is tolerating his current chemotherapy without difficulty. He has repeat myeloma panel performed last week and he is here for evaluation and discussion of his lab results and treatment options.  MEDICAL HISTORY: Past Medical History  Diagnosis Date  . Multiple myeloma 09/24/2011  . Hypertension     ALLERGIES:  is allergic to red dye and  other.  MEDICATIONS:  Current Outpatient Prescriptions  Medication Sig Dispense Refill  . amLODipine (NORVASC) 10 MG tablet Take 10 mg by mouth daily.      [name] azithromycin (ZITHROMAX) 250 MG tablet Take 2 tablets today and then 1 per day until complete  6 each  0  . benzonatate (TESSALON) 200 MG capsule Take 1 capsule (200 mg total) by mouth 3 (three) times daily as needed for cough.  20 capsule  0  . chlorpheniramine-HYDROcodone (TUSSIONEX) 10-8 MG/5ML LQCR Take  5 mLs by mouth every 12 (twelve) hours as needed for cough.  115 mL  0  . dexamethasone (DECADRON) 4 MG tablet Take 10 tablets (40 mg total) by mouth once a week. Mondays with chemo  40 tablet  2  . furosemide (LASIX) 20 MG tablet Take 45m (1 tablet) daily, alternating with 466m(2 tablets)  every other day.  45 tablet  0  . HYDROcodone-acetaminophen (NORCO) 10-325 MG per tablet Take 1-2 tablets by mouth every 6 (six) hours as needed. For pain.  40 tablet  0  . lidocaine-prilocaine (EMLA) cream Apply 1 application topically as needed.  30 g  0  . morphine (MS CONTIN) 30 MG 12 hr tablet Take 30 mg by mouth every 12 (twelve) hours as needed for pain.      . Nebivolol HCl (BYSTOLIC) 20 MG TABS Take 30 mg by mouth daily.      . Marland Kitchenmeprazole (PRILOSEC) 40 MG capsule Take 40 mg by mouth daily as needed (for heartburn).       . Marland KitchenxyCODONE-acetaminophen (PERCOCET/ROXICET) 5-325 MG per tablet Take 1 tablet by mouth every 6 (six) hours as needed for moderate pain.      . pomalidomide (POMALYST) 3 MG capsule Take 1 capsule (3 mg total) by mouth daily. Take with water on days 1-21. Repeat every 28 days.   AUTH # [id number]  21 capsule  0  . potassium chloride SA (K-DUR,KLOR-CON) 20 MEQ tablet TAKE 1 TABLET BY MOUTH EVERY DAY  30 tablet  0  . pregabalin (LYRICA) 50 MG capsule Take 1 capsule (50 mg total) by mouth 3 (three) times daily.  270 capsule  1  . PRESCRIPTION MEDICATION [facility] Dr. MoEarlie Server    . prochlorperazine (COMPAZINE) 10 MG tablet Take 10 mg by mouth every 6 (six) hours as needed for nausea.      . Marland Kitchenarfarin (COUMADIN) 5 MG tablet Take 1-1.5 tablets (5-7.5 mg total) by mouth daily. Take 5 mg by mouth daily except 7.5 mg on Mondays  50 tablet  1   No current facility-administered medications for this visit.   Facility-Administered Medications Ordered in Other Visits  Medication Dose Route Frequency Provider Last Rate Last Dose  . 0.9 %  sodium chloride  infusion   Intravenous Once Adrena E Johnson, PA-C      . sodium chloride 0.9 % injection 10 mL  10 mL Intracatheter PRN MoCurt BearsMD   10 mL at 07/27/13 1617  . sodium chloride 0.9 % injection 10 mL  10 mL Intracatheter PRN Adrena E Johnson, PA-C      . sodium chloride 0.9 % injection 10 mL  10 mL Intracatheter PRN MoCurt BearsMD   10 mL at 01/24/14 1111    SURGICAL HISTORY:  Past Surgical History  Procedure Laterality Date  . Video bronchoscopy  12/11/2012    Procedure: VIDEO BRONCHOSCOPY;  Surgeon: PeIvin PootMD;  [facility];  Service: Thoracic;  Laterality: N/A;  . Video assisted thoracoscopy  12/11/2012    Procedure: VIDEO ASSISTED THORACOSCOPY;  Surgeon: Ivin Poot, MD;  Location: [place];  Service: Thoracic;  Laterality: Right;  . Decortication  12/11/2012    Procedure: DECORTICATION;  Surgeon: Ivin Poot, MD;  Location[facility];  Service: Thoracic;  Laterality: N/A;    REVIEW OF SYSTEMS:  Constitutional: negative Eyes: negative Ears, nose, mouth, throat, and face: negative Respiratory: negative Cardiovascular: positive for lower extremity edema Gastrointestinal: negative Genitourinary:negative Integument/breast: negative Hematologic/lymphatic: negative Musculoskeletal:negative Neurological: negative Behavioral/Psych: negative Endocrine: negative Allergic/Immunologic: negative   PHYSICAL EXAMINATION: General appearance: alert, cooperative, fatigued and no distress Head: Normocephalic, without obvious abnormality, atraumatic, Nontender Neck: no adenopathy, no JVD, supple, symmetrical, trachea midline and thyroid not enlarged, symmetric, no tenderness/mass/nodules Lymph nodes: Cervical, supraclavicular, and axillary nodes normal. Resp: clear to auscultation bilaterally Back: symmetric, no curvature. ROM normal. No CVA tenderness. Cardio: regular rate and rhythm, S1, S2 normal, no murmur, click, rub or gallop GI: soft, non-tender; bowel sounds  normal; no masses,  no organomegaly Extremities: edema trace - 1+ pitting edema bilateral lower extremities Neurologic: Alert and oriented X 3, normal strength and tone. Normal symmetric reflexes. Normal coordination and gait  ECOG PERFORMANCE STATUS: 1 - Symptomatic but completely ambulatory  Blood pressure 147/71, pulse 59, temperature 98.4 F (36.9 C), temperature source Oral, resp. rate 20, height _0  (1.6 m), weight 222 lb 8 oz (100.925 kg), SpO2 97.00%.  LABORATORY DATA: Lab Results  Component Value Date   WBC 5.3 01/24/2014   HGB 10.3* 01/24/2014   HCT 32.5* 01/24/2014   MCV 93.9 01/24/2014   PLT 491* 01/24/2014      Chemistry      Component Value Date/Time   NA 144 01/24/2014 0854   NA 136 03/16/2013 1930   K 3.7 01/24/2014 0854   K 3.8 03/16/2013 1930   CL 112* 05/10/2013 0849   CL 110 03/16/2013 1930   CO2 26 01/24/2014 0854   CO2 16* 03/16/2013 1930   BUN 10.0 01/24/2014 0854   BUN 22 03/16/2013 1930   CREATININE 0.7 01/24/2014 0854   CREATININE 1.24 03/16/2013 1930      Component Value Date/Time   CALCIUM 9.2 01/24/2014 0854   CALCIUM 8.2* 03/16/2013 1930   ALKPHOS 58 01/24/2014 0854   ALKPHOS 31* 03/16/2013 1930   AST 18 01/24/2014 0854   AST 11 03/16/2013 1930   ALT 28 01/24/2014 0854   ALT 15 03/16/2013 1930   BILITOT 0.61 01/24/2014 0854   BILITOT 0.2* 03/16/2013 1930     Other lab results: Beta-2 microglobulin 4.99, IgG 481, IgG 23 and IgM 19. I. to chain 29.80, free lambda light chain 0.53 with a kappa/lambda ratio of 56.23   ASSESSMENT AND PLAN: The patient is a very pleasant 53 years old African American male with recurrent multiple myeloma currently on treatment with Carfilzomib 56 mg/M2, Pomalyst 3 mg by mouth daily for 21 days every 3 weeks in addition to Decadron 40 mg on a weekly basis. He is status post 4 cycles.   The patient is tolerating his treatment fairly well with no significant adverse effects. He recently had chest congestion and questionable acute bronchitis treated  with the back with some improvement. His myeloma panel showed improvement in the IgG level but the patient has increased in the free kappa light chain. I discussed the lab results and give a copy to the patient and his wife. I  recommended for him to continue with the current treatment for now for 2 more cycles followed by repeat myeloma panel. If he continues to have evidence for disease progression with the next 2 cycles I may consider the patient for treatment with Panobinstat Janey Greaser) in combination with Velcade and Decadron.  We will proceed with cycle #5 today as scheduled. The patient would come back for followup visit in 4 weeks with the start of cycle #6. He was advised to call immediately if he has any concerning symptoms in the interval.  He was advised to call immediately if she has any concerning symptoms in the interval. The patient voices understanding of current disease status and treatment options and is in agreement with the current care plan.  All questions were answered. The patient knows to call the clinic with any problems, questions or concerns. We can certainly see the patient much sooner if necessary. I had a 15 minutes of face-to-face counseling with the patient and his wife out of the total visit time of 25 minutes.  Disclaimer: This note was dictated with voice recognition software. Similar sounding words can inadvertently be transcribed and may not be corrected upon review.   Eilleen Kempf., MD 01/24/2014

## 2014-01-24 NOTE — Patient Instructions (Signed)
Continue 5 mg daily except for 7.5 mg on Mondays.   Recheck PT/INR with next scheduled lab/appointments on 02/07/14; lab at 8:15am, treatment at 8:45am and coumadin clinic at [facility].  We will see you in the infusion area.

## 2014-01-25 ENCOUNTER — Ambulatory Visit (HOSPITAL_BASED_OUTPATIENT_CLINIC_OR_DEPARTMENT_OTHER): Payer: 59

## 2014-01-25 VITALS — BP 143/70 | HR 57 | Temp 97.9°F | Resp 18

## 2014-01-25 DIAGNOSIS — C9000 Multiple myeloma not having achieved remission: Secondary | ICD-10-CM

## 2014-01-25 DIAGNOSIS — C9002 Multiple myeloma in relapse: Secondary | ICD-10-CM

## 2014-01-25 DIAGNOSIS — Z5112 Encounter for antineoplastic immunotherapy: Secondary | ICD-10-CM

## 2014-01-25 MED ORDER — DEXTROSE 5 % IV SOLN
56.0000 mg/m2 | Freq: Once | INTRAVENOUS | Status: AC
  Administered 2014-01-25: 118 mg via INTRAVENOUS
  Filled 2014-01-25: qty 59

## 2014-01-25 MED ORDER — SODIUM CHLORIDE 0.9 % IJ SOLN
10.0000 mL | INTRAMUSCULAR | Status: DC | PRN
  Administered 2014-01-25: 10 mL
  Filled 2014-01-25: qty 10

## 2014-01-25 MED ORDER — HEPARIN SOD (PORK) LOCK FLUSH 100 UNIT/ML IV SOLN
500.0000 [IU] | Freq: Once | INTRAVENOUS | Status: AC | PRN
  Administered 2014-01-25: 500 [IU]
  Filled 2014-01-25: qty 5

## 2014-01-25 MED ORDER — ONDANSETRON 8 MG/50ML IVPB (CHCC)
8.0000 mg | Freq: Once | INTRAVENOUS | Status: AC
  Administered 2014-01-25: 8 mg via INTRAVENOUS

## 2014-01-25 MED ORDER — ONDANSETRON 8 MG/NS 50 ML IVPB
INTRAVENOUS | Status: AC
  Filled 2014-01-25: qty 8

## 2014-01-25 MED ORDER — SODIUM CHLORIDE 0.9 % IV SOLN
Freq: Once | INTRAVENOUS | Status: AC
  Administered 2014-01-25: 09:00:00 via INTRAVENOUS

## 2014-01-25 MED ORDER — DEXAMETHASONE SODIUM PHOSPHATE 10 MG/ML IJ SOLN
INTRAMUSCULAR | Status: AC
  Filled 2014-01-25: qty 1

## 2014-01-25 MED ORDER — DEXAMETHASONE SODIUM PHOSPHATE 10 MG/ML IJ SOLN
10.0000 mg | Freq: Once | INTRAMUSCULAR | Status: AC
  Administered 2014-01-25: 10 mg via INTRAVENOUS

## 2014-01-25 MED ORDER — SODIUM CHLORIDE 0.9 % IV SOLN
Freq: Once | INTRAVENOUS | Status: AC
  Administered 2014-01-25: 08:00:00 via INTRAVENOUS

## 2014-01-25 NOTE — Patient Instructions (Signed)
[facility] Discharge Instructions for Patients Receiving Chemotherapy  Today you received the following chemotherapy agent: Kyprolis  To help prevent nausea and vomiting after your treatment, we encourage you to take your nausea medication as prescribed.    If you develop nausea and vomiting that is not controlled by your nausea medication, call the clinic.   BELOW ARE SYMPTOMS THAT SHOULD BE REPORTED IMMEDIATELY:  *FEVER GREATER THAN 100.5 F  *CHILLS WITH OR WITHOUT FEVER  NAUSEA AND VOMITING THAT IS NOT CONTROLLED WITH YOUR NAUSEA MEDICATION  *UNUSUAL SHORTNESS OF BREATH  *UNUSUAL BRUISING OR BLEEDING  TENDERNESS IN MOUTH AND THROAT WITH OR WITHOUT PRESENCE OF ULCERS  *URINARY PROBLEMS  *BOWEL PROBLEMS  UNUSUAL RASH Items with * indicate a potential emergency and should be followed up as soon as possible.  Feel free to call the clinic you have any questions or concerns. The clinic phone number is [phone].    

## 2014-01-31 ENCOUNTER — Other Ambulatory Visit: Payer: Self-pay | Admitting: Internal Medicine

## 2014-01-31 ENCOUNTER — Other Ambulatory Visit (HOSPITAL_BASED_OUTPATIENT_CLINIC_OR_DEPARTMENT_OTHER): Payer: 59

## 2014-01-31 ENCOUNTER — Ambulatory Visit (HOSPITAL_BASED_OUTPATIENT_CLINIC_OR_DEPARTMENT_OTHER): Payer: 59

## 2014-01-31 VITALS — BP 138/73 | HR 81 | Temp 99.1°F

## 2014-01-31 DIAGNOSIS — C9000 Multiple myeloma not having achieved remission: Secondary | ICD-10-CM

## 2014-01-31 DIAGNOSIS — C9002 Multiple myeloma in relapse: Secondary | ICD-10-CM

## 2014-01-31 DIAGNOSIS — Z5112 Encounter for antineoplastic immunotherapy: Secondary | ICD-10-CM

## 2014-01-31 LAB — CBC WITH DIFFERENTIAL/PLATELET
BASO%: 0.3 % (ref 0.0–2.0)
BASOS ABS: 0 10*3/uL (ref 0.0–0.1)
EOS ABS: 0.2 10*3/uL (ref 0.0–0.5)
EOS%: 2.3 % (ref 0.0–7.0)
HEMATOCRIT: 31.6 % — AB (ref 38.4–49.9)
HGB: 9.7 g/dL — ABNORMAL LOW (ref 13.0–17.1)
LYMPH%: 10.2 % — ABNORMAL LOW (ref 14.0–49.0)
MCH: 29.8 pg (ref 27.2–33.4)
MCHC: 30.7 g/dL — ABNORMAL LOW (ref 32.0–36.0)
MCV: 96.9 fL (ref 79.3–98.0)
MONO#: 0.9 10*3/uL (ref 0.1–0.9)
MONO%: 11 % (ref 0.0–14.0)
NEUT%: 76.2 % — ABNORMAL HIGH (ref 39.0–75.0)
NEUTROS ABS: 6 10*3/uL (ref 1.5–6.5)
Platelets: 104 10*3/uL — ABNORMAL LOW (ref 140–400)
RBC: 3.26 10*6/uL — ABNORMAL LOW (ref 4.20–5.82)
RDW: 20.3 % — ABNORMAL HIGH (ref 11.0–14.6)
WBC: 7.8 10*3/uL (ref 4.0–10.3)
lymph#: 0.8 10*3/uL — ABNORMAL LOW (ref 0.9–3.3)
nRBC: 0 % (ref 0–0)

## 2014-01-31 LAB — COMPREHENSIVE METABOLIC PANEL (CC13)
ALT: 12 U/L (ref 0–55)
ANION GAP: 9 meq/L (ref 3–11)
AST: 9 U/L (ref 5–34)
Albumin: 2.6 g/dL — ABNORMAL LOW (ref 3.5–5.0)
Alkaline Phosphatase: 43 U/L (ref 40–150)
BUN: 8.4 mg/dL (ref 7.0–26.0)
CALCIUM: 8.4 mg/dL (ref 8.4–10.4)
CHLORIDE: 108 meq/L (ref 98–109)
CO2: 25 meq/L (ref 22–29)
CREATININE: 0.7 mg/dL (ref 0.7–1.3)
GLUCOSE: 129 mg/dL (ref 70–140)
Potassium: 3.4 mEq/L — ABNORMAL LOW (ref 3.5–5.1)
Sodium: 143 mEq/L (ref 136–145)
Total Bilirubin: 1.08 mg/dL (ref 0.20–1.20)
Total Protein: 5.8 g/dL — ABNORMAL LOW (ref 6.4–8.3)

## 2014-01-31 MED ORDER — HEPARIN SOD (PORK) LOCK FLUSH 100 UNIT/ML IV SOLN
500.0000 [IU] | Freq: Once | INTRAVENOUS | Status: AC | PRN
  Administered 2014-01-31: 500 [IU]
  Filled 2014-01-31: qty 5

## 2014-01-31 MED ORDER — SODIUM CHLORIDE 0.9 % IJ SOLN
10.0000 mL | INTRAMUSCULAR | Status: DC | PRN
  Administered 2014-01-31: 10 mL
  Filled 2014-01-31: qty 10

## 2014-01-31 MED ORDER — DEXAMETHASONE SODIUM PHOSPHATE 10 MG/ML IJ SOLN
INTRAMUSCULAR | Status: AC
  Filled 2014-01-31: qty 1

## 2014-01-31 MED ORDER — MOXIFLOXACIN HCL 400 MG PO TABS: 400.0000 mg | ORAL_TABLET | Freq: Every day | ORAL | Status: DC

## 2014-01-31 MED ORDER — DEXTROSE 5 % IV SOLN
56.0000 mg/m2 | Freq: Once | INTRAVENOUS | Status: AC
  Administered 2014-01-31: 118 mg via INTRAVENOUS
  Filled 2014-01-31: qty 59

## 2014-01-31 MED ORDER — ONDANSETRON 8 MG/50ML IVPB (CHCC)
8.0000 mg | Freq: Once | INTRAVENOUS | Status: AC
  Administered 2014-01-31: 8 mg via INTRAVENOUS

## 2014-01-31 MED ORDER — DEXAMETHASONE SODIUM PHOSPHATE 10 MG/ML IJ SOLN
10.0000 mg | Freq: Once | INTRAMUSCULAR | Status: AC
  Administered 2014-01-31: 10 mg via INTRAVENOUS

## 2014-01-31 MED ORDER — SODIUM CHLORIDE 0.9 % IV SOLN
Freq: Once | INTRAVENOUS | Status: AC
  Administered 2014-01-31: 09:00:00 via INTRAVENOUS

## 2014-01-31 MED ORDER — ONDANSETRON 8 MG/NS 50 ML IVPB
INTRAVENOUS | Status: AC
  Filled 2014-01-31: qty 8

## 2014-01-31 NOTE — Patient Instructions (Signed)
[facility] Discharge Instructions for Patients Receiving Chemotherapy  Today you received the following chemotherapy agents Kyprolis To help prevent nausea and vomiting after your treatment, we encourage you to take your nausea medication as prescribed.  If you develop nausea and vomiting that is not controlled by your nausea medication, call the clinic.   BELOW ARE SYMPTOMS THAT SHOULD BE REPORTED IMMEDIATELY:  *FEVER GREATER THAN 100.5 F  *CHILLS WITH OR WITHOUT FEVER  NAUSEA AND VOMITING THAT IS NOT CONTROLLED WITH YOUR NAUSEA MEDICATION  *UNUSUAL SHORTNESS OF BREATH  *UNUSUAL BRUISING OR BLEEDING  TENDERNESS IN MOUTH AND THROAT WITH OR WITHOUT PRESENCE OF ULCERS  *URINARY PROBLEMS  *BOWEL PROBLEMS  UNUSUAL RASH Items with * indicate a potential emergency and should be followed up as soon as possible.  Feel free to call the clinic you have any questions or concerns. The clinic phone number is [phone].    

## 2014-01-31 NOTE — Progress Notes (Signed)
Patient complains of productive cough x 3 weeks. Patient reports sputum is thick and yellow in color. Patient denies SOB or chest pain. Patient denies fevers at home. Patient states he feels fatigued. Dr. Julien Nordmann notified, ok to treat and will call in Augment 400 mg for one week.

## 2014-02-01 ENCOUNTER — Other Ambulatory Visit: Payer: Self-pay | Admitting: Medical Oncology

## 2014-02-01 ENCOUNTER — Ambulatory Visit (HOSPITAL_BASED_OUTPATIENT_CLINIC_OR_DEPARTMENT_OTHER): Payer: 59

## 2014-02-01 VITALS — BP 147/74 | HR 75 | Temp 97.9°F | Resp 18

## 2014-02-01 DIAGNOSIS — C9002 Multiple myeloma in relapse: Secondary | ICD-10-CM

## 2014-02-01 DIAGNOSIS — C9000 Multiple myeloma not having achieved remission: Secondary | ICD-10-CM

## 2014-02-01 DIAGNOSIS — Z5112 Encounter for antineoplastic immunotherapy: Secondary | ICD-10-CM

## 2014-02-01 MED ORDER — ONDANSETRON 8 MG/NS 50 ML IVPB
INTRAVENOUS | Status: AC
  Filled 2014-02-01: qty 8

## 2014-02-01 MED ORDER — SODIUM CHLORIDE 0.9 % IJ SOLN
10.0000 mL | INTRAMUSCULAR | Status: DC | PRN
  Administered 2014-02-01: 10 mL
  Filled 2014-02-01: qty 10

## 2014-02-01 MED ORDER — DEXAMETHASONE SODIUM PHOSPHATE 10 MG/ML IJ SOLN
10.0000 mg | Freq: Once | INTRAMUSCULAR | Status: AC
Start: 2014-02-01 — End: 2014-02-01
  Administered 2014-02-01: 10 mg via INTRAVENOUS

## 2014-02-01 MED ORDER — DEXAMETHASONE SODIUM PHOSPHATE 10 MG/ML IJ SOLN
INTRAMUSCULAR | Status: AC
  Filled 2014-02-01: qty 1

## 2014-02-01 MED ORDER — DEXTROSE 5 % IV SOLN
56.0000 mg/m2 | Freq: Once | INTRAVENOUS | Status: AC
  Administered 2014-02-01: 118 mg via INTRAVENOUS
  Filled 2014-02-01: qty 59

## 2014-02-01 MED ORDER — HEPARIN SOD (PORK) LOCK FLUSH 100 UNIT/ML IV SOLN
500.0000 [IU] | Freq: Once | INTRAVENOUS | Status: AC | PRN
  Administered 2014-02-01: 500 [IU]
  Filled 2014-02-01: qty 5

## 2014-02-01 MED ORDER — SODIUM CHLORIDE 0.9 % IV SOLN
Freq: Once | INTRAVENOUS | Status: AC
  Administered 2014-02-01: 09:00:00 via INTRAVENOUS

## 2014-02-01 MED ORDER — ONDANSETRON 8 MG/50ML IVPB (CHCC)
8.0000 mg | Freq: Once | INTRAVENOUS | Status: AC
  Administered 2014-02-01: 8 mg via INTRAVENOUS

## 2014-02-01 MED ORDER — POMALIDOMIDE 3 MG PO CAPS: 3.0000 mg | ORAL_CAPSULE | Freq: Every day | ORAL | Status: DC

## 2014-02-01 NOTE — Telephone Encounter (Signed)
Faxed refill for pomalyst to [facility]

## 2014-02-01 NOTE — Patient Instructions (Signed)
[facility] Discharge Instructions for Patients Receiving Chemotherapy  Today you received the following chemotherapy agents Kyprolis.  To help prevent nausea and vomiting after your treatment, we encourage you to take your nausea medication compazine 10 mg, 1 tablet every six hrs as needed.   If you develop nausea and vomiting that is not controlled by your nausea medication, call the clinic.   BELOW ARE SYMPTOMS THAT SHOULD BE REPORTED IMMEDIATELY:  *FEVER GREATER THAN 100.5 F  *CHILLS WITH OR WITHOUT FEVER  NAUSEA AND VOMITING THAT IS NOT CONTROLLED WITH YOUR NAUSEA MEDICATION  *UNUSUAL SHORTNESS OF BREATH  *UNUSUAL BRUISING OR BLEEDING  TENDERNESS IN MOUTH AND THROAT WITH OR WITHOUT PRESENCE OF ULCERS  *URINARY PROBLEMS  *BOWEL PROBLEMS  UNUSUAL RASH Items with * indicate a potential emergency and should be followed up as soon as possible.  Feel free to call the clinic you have any questions or concerns. The clinic phone number is [phone].

## 2014-02-01 NOTE — Progress Notes (Signed)
Discharged at 1050 with spouse, ambulatory in no distress.

## 2014-02-07 ENCOUNTER — Ambulatory Visit (HOSPITAL_BASED_OUTPATIENT_CLINIC_OR_DEPARTMENT_OTHER): Payer: Self-pay | Admitting: Pharmacist

## 2014-02-07 ENCOUNTER — Other Ambulatory Visit (HOSPITAL_BASED_OUTPATIENT_CLINIC_OR_DEPARTMENT_OTHER): Payer: 59

## 2014-02-07 ENCOUNTER — Ambulatory Visit (HOSPITAL_BASED_OUTPATIENT_CLINIC_OR_DEPARTMENT_OTHER): Payer: 59

## 2014-02-07 VITALS — BP 140/78 | HR 70 | Temp 98.6°F | Resp 18

## 2014-02-07 DIAGNOSIS — C9002 Multiple myeloma in relapse: Secondary | ICD-10-CM

## 2014-02-07 DIAGNOSIS — Z5112 Encounter for antineoplastic immunotherapy: Secondary | ICD-10-CM

## 2014-02-07 DIAGNOSIS — I776 Arteritis, unspecified: Secondary | ICD-10-CM

## 2014-02-07 DIAGNOSIS — C9000 Multiple myeloma not having achieved remission: Secondary | ICD-10-CM

## 2014-02-07 LAB — COMPREHENSIVE METABOLIC PANEL (CC13)
ALK PHOS: 62 U/L (ref 40–150)
ALT: 13 U/L (ref 0–55)
AST: 9 U/L (ref 5–34)
Albumin: 2.7 g/dL — ABNORMAL LOW (ref 3.5–5.0)
Anion Gap: 10 mEq/L (ref 3–11)
BILIRUBIN TOTAL: 0.93 mg/dL (ref 0.20–1.20)
BUN: 14.3 mg/dL (ref 7.0–26.0)
CO2: 25 mEq/L (ref 22–29)
Calcium: 9.1 mg/dL (ref 8.4–10.4)
Chloride: 109 mEq/L (ref 98–109)
Creatinine: 0.7 mg/dL (ref 0.7–1.3)
GLUCOSE: 83 mg/dL (ref 70–140)
Potassium: 3.4 mEq/L — ABNORMAL LOW (ref 3.5–5.1)
Sodium: 144 mEq/L (ref 136–145)
Total Protein: 6.3 g/dL — ABNORMAL LOW (ref 6.4–8.3)

## 2014-02-07 LAB — CBC WITH DIFFERENTIAL/PLATELET
BASO%: 0.5 % (ref 0.0–2.0)
BASOS ABS: 0 10*3/uL (ref 0.0–0.1)
EOS%: 8.1 % — ABNORMAL HIGH (ref 0.0–7.0)
Eosinophils Absolute: 0.5 10*3/uL (ref 0.0–0.5)
HCT: 29.5 % — ABNORMAL LOW (ref 38.4–49.9)
HEMOGLOBIN: 9 g/dL — AB (ref 13.0–17.1)
LYMPH%: 17.3 % (ref 14.0–49.0)
MCH: 29.3 pg (ref 27.2–33.4)
MCHC: 30.5 g/dL — AB (ref 32.0–36.0)
MCV: 96.1 fL (ref 79.3–98.0)
MONO#: 1.3 10*3/uL — ABNORMAL HIGH (ref 0.1–0.9)
MONO%: 21.5 % — AB (ref 0.0–14.0)
NEUT#: 3.1 10*3/uL (ref 1.5–6.5)
NEUT%: 52.6 % (ref 39.0–75.0)
Platelets: 95 10*3/uL — ABNORMAL LOW (ref 140–400)
RBC: 3.07 10*6/uL — ABNORMAL LOW (ref 4.20–5.82)
RDW: 20.2 % — ABNORMAL HIGH (ref 11.0–14.6)
WBC: 5.9 10*3/uL (ref 4.0–10.3)
lymph#: 1 10*3/uL (ref 0.9–3.3)
nRBC: 1 % — ABNORMAL HIGH (ref 0–0)

## 2014-02-07 LAB — PROTIME-INR
INR: 1.6 — ABNORMAL LOW (ref 2.00–3.50)
Protime: 19.2 Seconds — ABNORMAL HIGH (ref 10.6–13.4)

## 2014-02-07 LAB — POCT INR: INR: 1.6

## 2014-02-07 MED ORDER — DEXTROSE 5 % IV SOLN
56.0000 mg/m2 | Freq: Once | INTRAVENOUS | Status: AC
  Administered 2014-02-07: 118 mg via INTRAVENOUS
  Filled 2014-02-07: qty 59

## 2014-02-07 MED ORDER — SODIUM CHLORIDE 0.9 % IJ SOLN
10.0000 mL | INTRAMUSCULAR | Status: DC | PRN
  Administered 2014-02-07: 10 mL
  Filled 2014-02-07: qty 10

## 2014-02-07 MED ORDER — HEPARIN SOD (PORK) LOCK FLUSH 100 UNIT/ML IV SOLN
500.0000 [IU] | Freq: Once | INTRAVENOUS | Status: AC | PRN
  Administered 2014-02-07: 500 [IU]
  Filled 2014-02-07: qty 5

## 2014-02-07 MED ORDER — SODIUM CHLORIDE 0.9 % IV SOLN
Freq: Once | INTRAVENOUS | Status: AC
  Administered 2014-02-07: 11:00:00 via INTRAVENOUS

## 2014-02-07 MED ORDER — DEXAMETHASONE SODIUM PHOSPHATE 10 MG/ML IJ SOLN
10.0000 mg | Freq: Once | INTRAMUSCULAR | Status: AC
Start: 2014-02-07 — End: 2014-02-07
  Administered 2014-02-07: 10 mg via INTRAVENOUS

## 2014-02-07 MED ORDER — ONDANSETRON 8 MG/50ML IVPB (CHCC)
8.0000 mg | Freq: Once | INTRAVENOUS | Status: AC
  Administered 2014-02-07: 8 mg via INTRAVENOUS

## 2014-02-07 MED ORDER — DEXAMETHASONE SODIUM PHOSPHATE 10 MG/ML IJ SOLN
INTRAMUSCULAR | Status: AC
  Filled 2014-02-07: qty 1

## 2014-02-07 MED ORDER — ONDANSETRON 8 MG/NS 50 ML IVPB
INTRAVENOUS | Status: AC
  Filled 2014-02-07: qty 8

## 2014-02-07 NOTE — Patient Instructions (Signed)
[facility] Discharge Instructions for Patients Receiving Chemotherapy  Today you received the following chemotherapy agents:  kyprolis  To help prevent nausea and vomiting after your treatment, we encourage you to take your nausea medication   If you develop nausea and vomiting that is not controlled by your nausea medication, call the clinic.   BELOW ARE SYMPTOMS THAT SHOULD BE REPORTED IMMEDIATELY:  *FEVER GREATER THAN 100.5 F  *CHILLS WITH OR WITHOUT FEVER  NAUSEA AND VOMITING THAT IS NOT CONTROLLED WITH YOUR NAUSEA MEDICATION  *UNUSUAL SHORTNESS OF BREATH  *UNUSUAL BRUISING OR BLEEDING  TENDERNESS IN MOUTH AND THROAT WITH OR WITHOUT PRESENCE OF ULCERS  *URINARY PROBLEMS  *BOWEL PROBLEMS  UNUSUAL RASH Items with * indicate a potential emergency and should be followed up as soon as possible.  Feel free to call the clinic you have any questions or concerns. The clinic phone number is [phone].    

## 2014-02-07 NOTE — Progress Notes (Signed)
INR below goal today at 1.6 Pt seen in infusion area He is here for Day 15 of Kyprolis His platelets are low at 95 and he is not feeling well with fatigue and weakness He has been on an antibiotic (avelox) since last week and this can potentially increase the effects of coumadin but his INR is below goal today. He has 2 days left of this antibiotic course He reports missing 1 dose last Friday (02/04/14) He reports no decrease in his appetite while feeling sick Wife reports pt has lost hearing in left ear starting about one week ago No unusual bleeding or bruising No other medication changes Dr. Julien Nordmann saw patient in infusion area for assessment and pt is ok to proceed with Kyprolis today Plan: No changes Continue 5 mg daily except for 7.5 mg on Mondays.   Recheck PT/INR with next scheduled lab/appointments on 02/14/14; lab at 8:00am and coumadin clinic at 8:15am.

## 2014-02-07 NOTE — Patient Instructions (Signed)
INR below goal today No changes Continue 5 mg daily except for 7.5 mg on Mondays.   Recheck PT/INR with next scheduled lab/appointments on 02/14/14; lab at 8:00am and coumadin clinic at 8:15am.   Dudley you feel better!

## 2014-02-07 NOTE — Progress Notes (Signed)
Pt arrived to clinic in w/c.  Required assistance to stand and transfer to chair.  C/o legs becoming weaker over this past week.  Wife states pt's arms are also weak.  Wife reports pt has lost hearing in left ear starting about one week ago.  Pt states it feels like it is clogged up.  Notified Dr. Julien Nordmann of above and he states he will come see pt in infusion room and to hold chemo until he sees pt..  Dr. Julien Nordmann assessed pt in infusion room.  Pt was able to get up from chair and walk under his own steam w/o assistance when Dr. Julien Nordmann was here.  Ok to proceed w/ Kyprolis today per Dr. Julien Nordmann.  Ok to treat with Platelets 95 per Dr. Vista Mink.

## 2014-02-08 ENCOUNTER — Ambulatory Visit (HOSPITAL_BASED_OUTPATIENT_CLINIC_OR_DEPARTMENT_OTHER): Payer: 59

## 2014-02-08 ENCOUNTER — Other Ambulatory Visit: Payer: Self-pay | Admitting: *Deleted

## 2014-02-08 VITALS — BP 154/76 | HR 71 | Temp 98.3°F | Resp 22

## 2014-02-08 DIAGNOSIS — C9000 Multiple myeloma not having achieved remission: Secondary | ICD-10-CM

## 2014-02-08 DIAGNOSIS — C9002 Multiple myeloma in relapse: Secondary | ICD-10-CM

## 2014-02-08 DIAGNOSIS — Z5112 Encounter for antineoplastic immunotherapy: Secondary | ICD-10-CM

## 2014-02-08 MED ORDER — SODIUM CHLORIDE 0.9 % IV SOLN
Freq: Once | INTRAVENOUS | Status: AC
  Administered 2014-02-08: 09:00:00 via INTRAVENOUS

## 2014-02-08 MED ORDER — ONDANSETRON 16 MG/50ML IVPB (CHCC)
INTRAVENOUS | Status: AC
  Filled 2014-02-08: qty 16

## 2014-02-08 MED ORDER — ONDANSETRON 8 MG/50ML IVPB (CHCC)
8.0000 mg | Freq: Once | INTRAVENOUS | Status: AC
  Administered 2014-02-08: 8 mg via INTRAVENOUS

## 2014-02-08 MED ORDER — DEXAMETHASONE SODIUM PHOSPHATE 10 MG/ML IJ SOLN
INTRAMUSCULAR | Status: AC
  Filled 2014-02-08: qty 1

## 2014-02-08 MED ORDER — HEPARIN SOD (PORK) LOCK FLUSH 100 UNIT/ML IV SOLN
500.0000 [IU] | Freq: Once | INTRAVENOUS | Status: AC | PRN
  Administered 2014-02-08: 500 [IU]
  Filled 2014-02-08: qty 5

## 2014-02-08 MED ORDER — DEXAMETHASONE SODIUM PHOSPHATE 10 MG/ML IJ SOLN
10.0000 mg | Freq: Once | INTRAMUSCULAR | Status: AC
  Administered 2014-02-08: 10 mg via INTRAVENOUS

## 2014-02-08 MED ORDER — SODIUM CHLORIDE 0.9 % IJ SOLN
10.0000 mL | INTRAMUSCULAR | Status: DC | PRN
  Administered 2014-02-08: 10 mL
  Filled 2014-02-08: qty 10

## 2014-02-08 MED ORDER — ONDANSETRON 8 MG/NS 50 ML IVPB
INTRAVENOUS | Status: AC
  Filled 2014-02-08: qty 8

## 2014-02-08 MED ORDER — DEXTROSE 5 % IV SOLN
56.0000 mg/m2 | Freq: Once | INTRAVENOUS | Status: AC
  Administered 2014-02-08: 118 mg via INTRAVENOUS
  Filled 2014-02-08: qty 59

## 2014-02-08 NOTE — Patient Instructions (Signed)
[facility] Discharge Instructions for Patients Receiving Chemotherapy  Today you received the following chemotherapy agents kyprolis.  To help prevent nausea and vomiting after your treatment, we encourage you to take your nausea medication compazine.   If you develop nausea and vomiting that is not controlled by your nausea medication, call the clinic.   BELOW ARE SYMPTOMS THAT SHOULD BE REPORTED IMMEDIATELY:  *FEVER GREATER THAN 100.5 F  *CHILLS WITH OR WITHOUT FEVER  NAUSEA AND VOMITING THAT IS NOT CONTROLLED WITH YOUR NAUSEA MEDICATION  *UNUSUAL SHORTNESS OF BREATH  *UNUSUAL BRUISING OR BLEEDING  TENDERNESS IN MOUTH AND THROAT WITH OR WITHOUT PRESENCE OF ULCERS  *URINARY PROBLEMS  *BOWEL PROBLEMS  UNUSUAL RASH Items with * indicate a potential emergency and should be followed up as soon as possible.  Feel free to call the clinic you have any questions or concerns. The clinic phone number is [phone].    

## 2014-02-11 ENCOUNTER — Telehealth: Payer: Self-pay | Admitting: Medical Oncology

## 2014-02-11 NOTE — Telephone Encounter (Signed)
Returned [name]'s call. She reports Tobe is wanting neurontin because he thinks Lyrica is causing his feet to swell and he is having pain in his lower extremities.  [name]  He is unable to stand and has pain from waist down. She said it is like he has been  Voiding "buckets " of urine  today and she describes he cannot catch his urine . She acknowledged that he was unable to control his BM today.I instructed Otila Kluver to call 911 and get Tymeer to ED. She said she would.

## 2014-02-14 ENCOUNTER — Other Ambulatory Visit: Payer: Self-pay | Admitting: *Deleted

## 2014-02-14 ENCOUNTER — Other Ambulatory Visit: Payer: 59

## 2014-02-14 ENCOUNTER — Ambulatory Visit (HOSPITAL_BASED_OUTPATIENT_CLINIC_OR_DEPARTMENT_OTHER): Payer: Self-pay | Admitting: Pharmacist

## 2014-02-14 DIAGNOSIS — I776 Arteritis, unspecified: Secondary | ICD-10-CM

## 2014-02-14 DIAGNOSIS — Z86718 Personal history of other venous thrombosis and embolism: Secondary | ICD-10-CM

## 2014-02-14 DIAGNOSIS — C9000 Multiple myeloma not having achieved remission: Secondary | ICD-10-CM

## 2014-02-14 DIAGNOSIS — C9002 Multiple myeloma in relapse: Secondary | ICD-10-CM

## 2014-02-14 LAB — CBC WITH DIFFERENTIAL/PLATELET
BASO%: 0.4 % (ref 0.0–2.0)
Basophils Absolute: 0 10*3/uL (ref 0.0–0.1)
EOS%: 5.3 % (ref 0.0–7.0)
Eosinophils Absolute: 0.3 10*3/uL (ref 0.0–0.5)
HCT: 30.1 % — ABNORMAL LOW (ref 38.4–49.9)
HGB: 9.3 g/dL — ABNORMAL LOW (ref 13.0–17.1)
LYMPH%: 8.5 % — AB (ref 14.0–49.0)
MCH: 29 pg (ref 27.2–33.4)
MCHC: 30.9 g/dL — AB (ref 32.0–36.0)
MCV: 93.8 fL (ref 79.3–98.0)
MONO#: 1.5 10*3/uL — AB (ref 0.1–0.9)
MONO%: 28.9 % — ABNORMAL HIGH (ref 0.0–14.0)
NEUT#: 3 10*3/uL (ref 1.5–6.5)
NEUT%: 56.9 % (ref 39.0–75.0)
NRBC: 1 % — AB (ref 0–0)
PLATELETS: 204 10*3/uL (ref 140–400)
RBC: 3.21 10*6/uL — AB (ref 4.20–5.82)
RDW: 19.5 % — ABNORMAL HIGH (ref 11.0–14.6)
WBC: 5.3 10*3/uL (ref 4.0–10.3)
lymph#: 0.5 10*3/uL — ABNORMAL LOW (ref 0.9–3.3)

## 2014-02-14 LAB — COMPREHENSIVE METABOLIC PANEL (CC13)
ALT: 9 U/L (ref 0–55)
ANION GAP: 12 meq/L — AB (ref 3–11)
AST: 8 U/L (ref 5–34)
Albumin: 2.7 g/dL — ABNORMAL LOW (ref 3.5–5.0)
Alkaline Phosphatase: 65 U/L (ref 40–150)
BILIRUBIN TOTAL: 0.82 mg/dL (ref 0.20–1.20)
BUN: 9.3 mg/dL (ref 7.0–26.0)
CHLORIDE: 106 meq/L (ref 98–109)
CO2: 23 meq/L (ref 22–29)
Calcium: 9.3 mg/dL (ref 8.4–10.4)
Creatinine: 0.7 mg/dL (ref 0.7–1.3)
Glucose: 97 mg/dl (ref 70–140)
Potassium: 3.3 mEq/L — ABNORMAL LOW (ref 3.5–5.1)
Sodium: 141 mEq/L (ref 136–145)
Total Protein: 6.3 g/dL — ABNORMAL LOW (ref 6.4–8.3)

## 2014-02-14 LAB — PROTIME-INR
INR: 2.3 (ref 2.00–3.50)
Protime: 27.6 Seconds — ABNORMAL HIGH (ref 10.6–13.4)

## 2014-02-14 LAB — POCT INR: INR: 2.3

## 2014-02-14 MED ORDER — DEXAMETHASONE 4 MG PO TABS: 40.0000 mg | ORAL_TABLET | ORAL | Status: DC

## 2014-02-14 NOTE — Progress Notes (Signed)
INR = 2.3 on Coumadin 5 mg daily except 7.5 on Mondays Pt has not missed any Coumadin doses. No bleeding/bruising. Has had BLE swelling but he has stopped taking scheduled Lyrica & he thinks the swelling is improved.  I discussed the fact that taking Lyrica PRN really is not going to help w/ his neuropathic pain.  If he should go back on Neurontin (pts wife questioned to RN on 3/27 by phone) there is no expected interaction w/ Coumadin. Pt is going to [facility] for [place] this weekend. INR at goal.  No change to Coumadin dose. We will see pt in infusion 03/07/14. Kennith Center, Pharm.D., CPP 02/14/2014@8 :42 AM

## 2014-02-21 ENCOUNTER — Ambulatory Visit (HOSPITAL_BASED_OUTPATIENT_CLINIC_OR_DEPARTMENT_OTHER): Payer: 59

## 2014-02-21 ENCOUNTER — Ambulatory Visit (HOSPITAL_BASED_OUTPATIENT_CLINIC_OR_DEPARTMENT_OTHER): Payer: 59 | Admitting: Internal Medicine

## 2014-02-21 ENCOUNTER — Telehealth: Payer: Self-pay | Admitting: Pharmacist

## 2014-02-21 ENCOUNTER — Telehealth: Payer: Self-pay | Admitting: Internal Medicine

## 2014-02-21 ENCOUNTER — Other Ambulatory Visit (HOSPITAL_BASED_OUTPATIENT_CLINIC_OR_DEPARTMENT_OTHER): Payer: 59

## 2014-02-21 ENCOUNTER — Encounter: Payer: Self-pay | Admitting: Internal Medicine

## 2014-02-21 ENCOUNTER — Ambulatory Visit: Payer: 59 | Admitting: Pharmacist

## 2014-02-21 VITALS — BP 153/73 | HR 81 | Temp 99.0°F | Resp 18 | Ht 63.0 in | Wt 227.4 lb

## 2014-02-21 DIAGNOSIS — C9000 Multiple myeloma not having achieved remission: Secondary | ICD-10-CM

## 2014-02-21 DIAGNOSIS — R5381 Other malaise: Secondary | ICD-10-CM

## 2014-02-21 DIAGNOSIS — I776 Arteritis, unspecified: Secondary | ICD-10-CM

## 2014-02-21 DIAGNOSIS — M949 Disorder of cartilage, unspecified: Secondary | ICD-10-CM

## 2014-02-21 DIAGNOSIS — C9002 Multiple myeloma in relapse: Secondary | ICD-10-CM

## 2014-02-21 DIAGNOSIS — G8918 Other acute postprocedural pain: Secondary | ICD-10-CM

## 2014-02-21 DIAGNOSIS — G629 Polyneuropathy, unspecified: Secondary | ICD-10-CM

## 2014-02-21 DIAGNOSIS — R5383 Other fatigue: Secondary | ICD-10-CM

## 2014-02-21 DIAGNOSIS — Z5112 Encounter for antineoplastic immunotherapy: Secondary | ICD-10-CM

## 2014-02-21 DIAGNOSIS — M899 Disorder of bone, unspecified: Secondary | ICD-10-CM

## 2014-02-21 LAB — CBC WITH DIFFERENTIAL/PLATELET
BASO%: 2.3 % — ABNORMAL HIGH (ref 0.0–2.0)
Basophils Absolute: 0.1 10*3/uL (ref 0.0–0.1)
EOS ABS: 0.1 10*3/uL (ref 0.0–0.5)
EOS%: 1.1 % (ref 0.0–7.0)
HCT: 29.6 % — ABNORMAL LOW (ref 38.4–49.9)
HGB: 9.4 g/dL — ABNORMAL LOW (ref 13.0–17.1)
LYMPH%: 6.9 % — ABNORMAL LOW (ref 14.0–49.0)
MCH: 29.5 pg (ref 27.2–33.4)
MCHC: 31.6 g/dL — ABNORMAL LOW (ref 32.0–36.0)
MCV: 93.3 fL (ref 79.3–98.0)
MONO#: 0.9 10*3/uL (ref 0.1–0.9)
MONO%: 15.9 % — AB (ref 0.0–14.0)
NEUT%: 73.8 % (ref 39.0–75.0)
NEUTROS ABS: 4.3 10*3/uL (ref 1.5–6.5)
Platelets: 359 10*3/uL (ref 140–400)
RBC: 3.17 10*6/uL — ABNORMAL LOW (ref 4.20–5.82)
RDW: 20.6 % — AB (ref 11.0–14.6)
WBC: 5.9 10*3/uL (ref 4.0–10.3)
lymph#: 0.4 10*3/uL — ABNORMAL LOW (ref 0.9–3.3)

## 2014-02-21 LAB — COMPREHENSIVE METABOLIC PANEL (CC13)
ALK PHOS: 66 U/L (ref 40–150)
ALT: 8 U/L (ref 0–55)
AST: 9 U/L (ref 5–34)
Albumin: 2.8 g/dL — ABNORMAL LOW (ref 3.5–5.0)
Anion Gap: 14 mEq/L — ABNORMAL HIGH (ref 3–11)
BUN: 9.3 mg/dL (ref 7.0–26.0)
CALCIUM: 8.7 mg/dL (ref 8.4–10.4)
CHLORIDE: 107 meq/L (ref 98–109)
CO2: 21 mEq/L — ABNORMAL LOW (ref 22–29)
Creatinine: 0.7 mg/dL (ref 0.7–1.3)
Glucose: 128 mg/dl (ref 70–140)
POTASSIUM: 3.2 meq/L — AB (ref 3.5–5.1)
Sodium: 142 mEq/L (ref 136–145)
Total Bilirubin: 0.82 mg/dL (ref 0.20–1.20)
Total Protein: 6.2 g/dL — ABNORMAL LOW (ref 6.4–8.3)

## 2014-02-21 LAB — POCT INR: INR: 4

## 2014-02-21 LAB — PROTIME-INR
INR: 4 — AB (ref 2.00–3.50)
PROTIME: 48 s — AB (ref 10.6–13.4)

## 2014-02-21 MED ORDER — HYDROCODONE-ACETAMINOPHEN 10-325 MG PO TABS: 1.0000 | ORAL_TABLET | Freq: Four times a day (QID) | ORAL | Status: DC | PRN

## 2014-02-21 MED ORDER — DEXAMETHASONE SODIUM PHOSPHATE 10 MG/ML IJ SOLN
INTRAMUSCULAR | Status: AC
  Filled 2014-02-21: qty 1

## 2014-02-21 MED ORDER — ONDANSETRON 8 MG/50ML IVPB (CHCC)
8.0000 mg | Freq: Once | INTRAVENOUS | Status: AC
  Administered 2014-02-21: 8 mg via INTRAVENOUS

## 2014-02-21 MED ORDER — HEPARIN SOD (PORK) LOCK FLUSH 100 UNIT/ML IV SOLN
500.0000 [IU] | Freq: Once | INTRAVENOUS | Status: AC | PRN
  Administered 2014-02-21: 500 [IU]
  Filled 2014-02-21: qty 5

## 2014-02-21 MED ORDER — DEXAMETHASONE SODIUM PHOSPHATE 10 MG/ML IJ SOLN
10.0000 mg | Freq: Once | INTRAMUSCULAR | Status: AC
  Administered 2014-02-21: 10 mg via INTRAVENOUS

## 2014-02-21 MED ORDER — ONDANSETRON 8 MG/NS 50 ML IVPB
INTRAVENOUS | Status: AC
  Filled 2014-02-21: qty 8

## 2014-02-21 MED ORDER — SODIUM CHLORIDE 0.9 % IJ SOLN
10.0000 mL | INTRAMUSCULAR | Status: DC | PRN
  Administered 2014-02-21: 10 mL
  Filled 2014-02-21: qty 10

## 2014-02-21 MED ORDER — DEXTROSE 5 % IV SOLN
56.0000 mg/m2 | Freq: Once | INTRAVENOUS | Status: AC
  Administered 2014-02-21: 118 mg via INTRAVENOUS
  Filled 2014-02-21: qty 59

## 2014-02-21 MED ORDER — MORPHINE SULFATE ER 30 MG PO TBCR: 30.0000 mg | EXTENDED_RELEASE_TABLET | Freq: Two times a day (BID) | ORAL | Status: DC | PRN

## 2014-02-21 MED ORDER — SODIUM CHLORIDE 0.9 % IV SOLN
Freq: Once | INTRAVENOUS | Status: AC
  Administered 2014-02-21: 11:00:00 via INTRAVENOUS

## 2014-02-21 NOTE — Progress Notes (Signed)
[place] [phone]   [phone]) [phone]  OFFICE VISIT PROGRESS NOTE  [name]., MD [address] [place] [address]  Principle Diagnosis:  #1 recurrent multiple myeloma IgG kappa subtype diagnosed in June of 2008  #2 history of vasculitis and thrombosis of skin lesions   Prior Therapy:  #1 status post palliative radiotherapy to the left hip under the care of Dr. Sondra Come  #2 status post 5 cycles of systemic chemotherapy with Revlimid and low dose Decadron. Last dose given June 2009 with good response.  #3 status post autologous peripheral blood stem cell transplant at [facility] on 02/02/2008.  #4 the patient had evidence for disease recurrence in December 2010.  #5 Revlimid 25 mg by mouth daily for 21 days every 4 weeks in addition to Decadron 40 mg orally on a weekly basis. The patient is status post 28 cycles, discontinued today secondary to disease progression.  #6 Systemic chemotherapy with Velcade 1.3 mg/M2 on days 1, 4, 8 and 11 in addition to Doxil 30 mg/M2 on day 4 and Decadron 40 mg by mouth on weekly basis every 3 weeks. Status post 3 cycles, last dose was given 05/04/2012 discontinued secondary to intolerance.  #7 Salvage therapy treatment with the Pomalyst 4 mg by mouth daily for 21 days every 28 days as well as Cytoxan 50 mg by mouth every other day and dexamethasone 40 mg by mouth once weekly. Therapy beginning 09/25/2012, status post 2 cycles discontinued secondary to disease progression and intolerance.  #8 Kyprolis (Carfilzomib) 27 mg/M2 on days 1, 2, 8, 9, 15 and 16 every 4 weeks. First dose on 02/08/2013. This would be concurrent with the dexamethasone 40 mg by mouth on a weekly basis. Status post 3 cycles with disease progression.  #9 Systemic chemotherapy with Carfilzomib 27 mg/M2 days 1,2 , 8, 9, 15 and 16, Cytoxan 300 mg/M2 and dexamethasone 40 mg by mouth weekly every 4 weeks. Status post 4  cycles. Last dose was given 08/09/2013 discontinued today secondary to disease progression.  Current therapy:  1) Systemic chemotherapy with Carfilzomib 56 mg/M2 days 1,2 , 8, 9, 15 and 16, Pomalyst 3 mg by mouth daily for 21 days every 4 weeks and dexamethasone 40 mg by mouth weekly every 4 weeks. First cycle started on 10/05/2013. The patient missed day 8 and 9 of the first cycle secondary to thrombocytopenia. Status post 4 cycles.  2) Zometa 4 mg IV given every 3 months.    INTERVAL HISTORY: [name] 53 y.o. male returns to the clinic today for a work in visit accompanied by his wife and daughter. He tolerated the last cycle of his treatment with Carfilzomib, Pomalyst and dexamethasone fairly well except for increasing fatigue and pain in the left lower extremity and right arm. His wife complains that he is not able to ambulate regularly at these days. He denies any fever, chills, diarrhea or constipation. He has no chest pain, shortness of breath, cough or hemoptysis. The patient denied having any nausea or vomiting. He has no weight loss or night sweats.   MEDICAL HISTORY: Past Medical History  Diagnosis Date  . Multiple myeloma 09/24/2011  . Hypertension     ALLERGIES:  is allergic to red dye and other.  MEDICATIONS:  Current Outpatient Prescriptions  Medication Sig Dispense Refill  . amLODipine (NORVASC) 10 MG tablet Take 10 mg by mouth daily.      . benzonatate (TESSALON) 200  MG capsule Take 1 capsule (200 mg total) by mouth 3 (three) times daily as needed for cough.  20 capsule  0  . chlorpheniramine-HYDROcodone (TUSSIONEX) 10-8 MG/5ML LQCR Take 5 mLs by mouth every 12 (twelve) hours as needed for cough.  115 mL  0  . dexamethasone (DECADRON) 4 MG tablet Take 10 tablets (40 mg total) by mouth once a week. Mondays with chemo  40 tablet  2  . furosemide (LASIX) 20 MG tablet Take 20m (1 tablet) daily, alternating with 428m(2 tablets)  every other day.  45 tablet  0  .  HYDROcodone-acetaminophen (NORCO) 10-325 MG per tablet Take 1-2 tablets by mouth every 6 (six) hours as needed. For pain.  40 tablet  0  . lidocaine-prilocaine (EMLA) cream Apply 1 application topically as needed.  30 g  0  . morphine (MS CONTIN) 30 MG 12 hr tablet Take 30 mg by mouth every 12 (twelve) hours as needed for pain.      . Nebivolol HCl (BYSTOLIC) 20 MG TABS Take 30 mg by mouth daily.      . Marland Kitchenmeprazole (PRILOSEC) 40 MG capsule Take 40 mg by mouth daily as needed (for heartburn).       . Marland KitchenxyCODONE-acetaminophen (PERCOCET/ROXICET) 5-325 MG per tablet Take 1 tablet by mouth every 6 (six) hours as needed for moderate pain.      . pomalidomide (POMALYST) 3 MG capsule Take 1 capsule (3 mg total) by mouth daily. Take with water on days 1-21. Repeat every 28 days.   AUTH # [id number] 02/01/14  21 capsule  0  . potassium chloride SA (K-DUR,KLOR-CON) 20 MEQ tablet TAKE 1 TABLET BY MOUTH EVERY DAY  30 tablet  0  . pregabalin (LYRICA) 50 MG capsule Take 1 capsule (50 mg total) by mouth 3 (three) times daily.  270 capsule  1  . PRESCRIPTION MEDICATION [facility] Dr. MoEarlie Server    . prochlorperazine (COMPAZINE) 10 MG tablet Take 10 mg by mouth every 6 (six) hours as needed for nausea.      . Marland Kitchenarfarin (COUMADIN) 5 MG tablet Take 1-1.5 tablets (5-7.5 mg total) by mouth daily. Take 5 mg by mouth daily except 7.5 mg on Mondays  50 tablet  1   No current facility-administered medications for this visit.   Facility-Administered Medications Ordered in Other Visits  Medication Dose Route Frequency Provider Last Rate Last Dose  . 0.9 %  sodium chloride infusion   Intravenous Once Adrena E Johnson, PA-C      . sodium chloride 0.9 % injection 10 mL  10 mL Intracatheter PRN MoCurt BearsMD   10 mL at 07/27/13 1617  . sodium chloride 0.9 % injection 10 mL  10 mL Intracatheter PRN AdCarlton AdamPA-C        SURGICAL HISTORY:  Past Surgical History  Procedure Laterality  Date  . Video bronchoscopy  12/11/2012    Procedure: VIDEO BRONCHOSCOPY;  Surgeon: PeIvin PootMD;  [facility];  Laterality: N/A;  . Video assisted thoracoscopy  12/11/2012    Procedure: VIDEO ASSISTED THORACOSCOPY;  Surgeon: PeIvin PootMD;  [facility]: Thoracic;  Laterality: Right;  . Decortication  12/11/2012    Procedure: DECORTICATION;  Surgeon: PeIvin PootMD;  [facility];  Service: Thoracic;  Laterality: N/A;    REVIEW OF SYSTEMS:  Constitutional: negative Eyes: negative Ears, nose, mouth, throat, and face:  negative Respiratory: negative Cardiovascular: positive for lower extremity edema Gastrointestinal: negative Genitourinary:negative Integument/breast: negative Hematologic/lymphatic: negative Musculoskeletal:negative Neurological: negative Behavioral/Psych: negative Endocrine: negative Allergic/Immunologic: negative   PHYSICAL EXAMINATION: General appearance: alert, cooperative, fatigued and no distress Head: Normocephalic, without obvious abnormality, atraumatic, Nontender Neck: no adenopathy, no JVD, supple, symmetrical, trachea midline and thyroid not enlarged, symmetric, no tenderness/mass/nodules Lymph nodes: Cervical, supraclavicular, and axillary nodes normal. Resp: clear to auscultation bilaterally Back: symmetric, no curvature. ROM normal. No CVA tenderness. Cardio: regular rate and rhythm, S1, S2 normal, no murmur, click, rub or gallop GI: soft, non-tender; bowel sounds normal; no masses,  no organomegaly Extremities: edema trace - 1+ pitting edema bilateral lower extremities Neurologic: Alert and oriented X 3, normal strength and tone. Normal symmetric reflexes. Normal coordination and gait  ECOG PERFORMANCE STATUS: 1 - Symptomatic but completely ambulatory  Blood pressure 153/73, pulse 81, temperature 99 F (37.2 C), temperature source Oral, resp. rate 18, height '5\' 3"'  (1.6 m), weight 227 lb 6.4 oz  (103.148 kg).  LABORATORY DATA: Lab Results  Component Value Date   WBC 5.9 02/21/2014   HGB 9.4* 02/21/2014   HCT 29.6* 02/21/2014   MCV 93.3 02/21/2014   PLT 359 02/21/2014      Chemistry      Component Value Date/Time   NA 142 02/21/2014 0926   NA 136 03/16/2013 1930   K 3.2* 02/21/2014 0926   K 3.8 03/16/2013 1930   CL 112* 05/10/2013 0849   CL 110 03/16/2013 1930   CO2 21* 02/21/2014 0926   CO2 16* 03/16/2013 1930   BUN 9.3 02/21/2014 0926   BUN 22 03/16/2013 1930   CREATININE 0.7 02/21/2014 0926   CREATININE 1.24 03/16/2013 1930      Component Value Date/Time   CALCIUM 8.7 02/21/2014 0926   CALCIUM 8.2* 03/16/2013 1930   ALKPHOS 66 02/21/2014 0926   ALKPHOS 31* 03/16/2013 1930   AST 9 02/21/2014 0926   AST 11 03/16/2013 1930   ALT 8 02/21/2014 0926   ALT 15 03/16/2013 1930   BILITOT 0.82 02/21/2014 0926   BILITOT 0.2* 03/16/2013 1930       ASSESSMENT AND PLAN: The patient is a very pleasant 53 years old African American male with recurrent multiple myeloma currently on treatment with Carfilzomib 56 mg/M2, Pomalyst 3 mg by mouth daily for 21 days every 3 weeks in addition to Decadron 40 mg on a weekly basis. He is status post 5 cycles.   The patient is tolerating his treatment fairly well with no significant adverse effects except for the generalized fatigue and bone pain.  I recommended for the patient to proceed with his treatment today as scheduled. I will order a skeletal bone survey to be performed later this week for evaluation of his bone and to rule out any new lytic lesions. He would come back for followup visit in 2 weeks for reevaluation. He was advised to call immediately if he has any concerning symptoms in the interval.  He was advised to call immediately if she has any concerning symptoms in the interval. The patient voices understanding of current disease status and treatment options and is in agreement with the current care plan.  All questions were answered. The patient knows to  call the clinic with any problems, questions or concerns. We can certainly see the patient much sooner if necessary. I had a 15 minutes of face-to-face counseling with the patient and his wife out of the total visit time of 25 minutes.  Disclaimer: This note was dictated with voice recognition software. Similar sounding words can inadvertently be transcribed and may not be corrected upon review.   Eilleen Kempf., MD 02/21/2014

## 2014-02-21 NOTE — Patient Instructions (Signed)
[facility] Discharge Instructions for Patients Receiving Chemotherapy  Today you received the following chemotherapy agents Kyprolis.  To help prevent nausea and vomiting after your treatment, we encourage you to take your nausea medication.   If you develop nausea and vomiting that is not controlled by your nausea medication, call the clinic.   BELOW ARE SYMPTOMS THAT SHOULD BE REPORTED IMMEDIATELY:  *FEVER GREATER THAN 100.5 F  *CHILLS WITH OR WITHOUT FEVER  NAUSEA AND VOMITING THAT IS NOT CONTROLLED WITH YOUR NAUSEA MEDICATION  *UNUSUAL SHORTNESS OF BREATH  *UNUSUAL BRUISING OR BLEEDING  TENDERNESS IN MOUTH AND THROAT WITH OR WITHOUT PRESENCE OF ULCERS  *URINARY PROBLEMS  *BOWEL PROBLEMS  UNUSUAL RASH Items with * indicate a potential emergency and should be followed up as soon as possible.  Feel free to call the clinic you have any questions or concerns. The clinic phone number is [phone].    

## 2014-02-21 NOTE — Telephone Encounter (Signed)
gv and printed appt sched and avs for pt for April  °

## 2014-02-21 NOTE — Progress Notes (Signed)
INR = 4 today No clear reason for sudden increase in INR.  I just saw him last week & his INR was at goal (= 2.3) No bleeding. Pt c/o leg & arm pain.   He is not eating well.  His wife states pt likes to eat "sweets" rather than proteins. His albumin is low.  I explained to pt & his wife how Coumadin is highly protein bound & with low albumin, there is more "free Coumadin" in his blood stream, leading to high INR. Pt requesting to have cabbage tonight w/ his dinner to try to bring his INR back down. INR supratherapeutic.  Hold x 1 dose of Coumadin today. He may eat cabbage tonight for dinner. He is being scanned later this week for new bone lesions.  This appt is not yet scheduled.  We can fit him in to see [place] again later this week for Coumadin clinic (after his scan).  Pt aware of plan. He will call if he develops bleeding. Kennith Center, Pharm.D., CPP 02/21/2014@12 :02 PM

## 2014-02-22 ENCOUNTER — Ambulatory Visit (HOSPITAL_BASED_OUTPATIENT_CLINIC_OR_DEPARTMENT_OTHER): Payer: 59

## 2014-02-22 VITALS — BP 138/69 | HR 78 | Temp 98.2°F | Resp 19

## 2014-02-22 DIAGNOSIS — C9000 Multiple myeloma not having achieved remission: Secondary | ICD-10-CM

## 2014-02-22 DIAGNOSIS — C9002 Multiple myeloma in relapse: Secondary | ICD-10-CM

## 2014-02-22 DIAGNOSIS — Z5112 Encounter for antineoplastic immunotherapy: Secondary | ICD-10-CM

## 2014-02-22 MED ORDER — SODIUM CHLORIDE 0.9 % IV SOLN
Freq: Once | INTRAVENOUS | Status: AC
  Administered 2014-02-22: 09:00:00 via INTRAVENOUS

## 2014-02-22 MED ORDER — DEXAMETHASONE SODIUM PHOSPHATE 10 MG/ML IJ SOLN
INTRAMUSCULAR | Status: AC
  Filled 2014-02-22: qty 1

## 2014-02-22 MED ORDER — DEXAMETHASONE SODIUM PHOSPHATE 10 MG/ML IJ SOLN
10.0000 mg | Freq: Once | INTRAMUSCULAR | Status: AC
  Administered 2014-02-22: 10 mg via INTRAVENOUS

## 2014-02-22 MED ORDER — SODIUM CHLORIDE 0.9 % IJ SOLN
10.0000 mL | INTRAMUSCULAR | Status: DC | PRN
  Administered 2014-02-22: 10 mL
  Filled 2014-02-22: qty 10

## 2014-02-22 MED ORDER — HEPARIN SOD (PORK) LOCK FLUSH 100 UNIT/ML IV SOLN
500.0000 [IU] | Freq: Once | INTRAVENOUS | Status: AC | PRN
  Administered 2014-02-22: 500 [IU]
  Filled 2014-02-22: qty 5

## 2014-02-22 MED ORDER — ONDANSETRON 8 MG/NS 50 ML IVPB
INTRAVENOUS | Status: AC
  Filled 2014-02-22: qty 8

## 2014-02-22 MED ORDER — ONDANSETRON 8 MG/50ML IVPB (CHCC)
8.0000 mg | Freq: Once | INTRAVENOUS | Status: AC
  Administered 2014-02-22: 8 mg via INTRAVENOUS

## 2014-02-22 MED ORDER — DEXTROSE 5 % IV SOLN
56.0000 mg/m2 | Freq: Once | INTRAVENOUS | Status: AC
  Administered 2014-02-22: 118 mg via INTRAVENOUS
  Filled 2014-02-22: qty 59

## 2014-02-22 NOTE — Progress Notes (Signed)
Labs reviewed from 02/21/14, labs within treatment parameters. Patient tolerated treatment well.

## 2014-02-22 NOTE — Patient Instructions (Signed)
[facility] Discharge Instructions for Patients Receiving Chemotherapy  Today you received the following chemotherapy agents: Kyprolis  To help prevent nausea and vomiting after your treatment, we encourage you to take your nausea medication: Compazine 10 mg every 6 hrs as needed.    If you develop nausea and vomiting that is not controlled by your nausea medication, call the clinic.   BELOW ARE SYMPTOMS THAT SHOULD BE REPORTED IMMEDIATELY:  *FEVER GREATER THAN 100.5 F  *CHILLS WITH OR WITHOUT FEVER  NAUSEA AND VOMITING THAT IS NOT CONTROLLED WITH YOUR NAUSEA MEDICATION  *UNUSUAL SHORTNESS OF BREATH  *UNUSUAL BRUISING OR BLEEDING  TENDERNESS IN MOUTH AND THROAT WITH OR WITHOUT PRESENCE OF ULCERS  *URINARY PROBLEMS  *BOWEL PROBLEMS  UNUSUAL RASH Items with * indicate a potential emergency and should be followed up as soon as possible.  Feel free to call the clinic you have any questions or concerns. The clinic phone number is [phone].    

## 2014-02-24 ENCOUNTER — Ambulatory Visit: Payer: 59

## 2014-02-24 ENCOUNTER — Ambulatory Visit (HOSPITAL_COMMUNITY)
Admission: RE | Admit: 2014-02-24 | Discharge: 2014-02-24 | Disposition: A | Discharge status: Home / Self Care | Payer: 59 | Source: Ambulatory Visit | Attending: Internal Medicine | Admitting: Internal Medicine

## 2014-02-24 ENCOUNTER — Other Ambulatory Visit: Payer: 59

## 2014-02-24 DIAGNOSIS — G8918 Other acute postprocedural pain: Secondary | ICD-10-CM

## 2014-02-24 DIAGNOSIS — G629 Polyneuropathy, unspecified: Secondary | ICD-10-CM

## 2014-02-24 DIAGNOSIS — M959 Acquired deformity of musculoskeletal system, unspecified: Secondary | ICD-10-CM | POA: Insufficient documentation

## 2014-02-24 DIAGNOSIS — M8448XA Pathological fracture, other site, initial encounter for fracture: Secondary | ICD-10-CM | POA: Insufficient documentation

## 2014-02-24 DIAGNOSIS — C9000 Multiple myeloma not having achieved remission: Secondary | ICD-10-CM | POA: Insufficient documentation

## 2014-02-24 DIAGNOSIS — IMO0002 Reserved for concepts with insufficient information to code with codable children: Secondary | ICD-10-CM | POA: Insufficient documentation

## 2014-02-28 ENCOUNTER — Ambulatory Visit (HOSPITAL_BASED_OUTPATIENT_CLINIC_OR_DEPARTMENT_OTHER): Payer: Self-pay | Admitting: Pharmacist

## 2014-02-28 ENCOUNTER — Ambulatory Visit (HOSPITAL_BASED_OUTPATIENT_CLINIC_OR_DEPARTMENT_OTHER): Payer: 59

## 2014-02-28 ENCOUNTER — Other Ambulatory Visit (HOSPITAL_BASED_OUTPATIENT_CLINIC_OR_DEPARTMENT_OTHER): Payer: 59

## 2014-02-28 VITALS — BP 150/71 | HR 77 | Temp 98.4°F | Resp 18

## 2014-02-28 DIAGNOSIS — C9000 Multiple myeloma not having achieved remission: Secondary | ICD-10-CM

## 2014-02-28 DIAGNOSIS — I776 Arteritis, unspecified: Secondary | ICD-10-CM

## 2014-02-28 DIAGNOSIS — C9002 Multiple myeloma in relapse: Secondary | ICD-10-CM

## 2014-02-28 DIAGNOSIS — Z5112 Encounter for antineoplastic immunotherapy: Secondary | ICD-10-CM

## 2014-02-28 LAB — COMPREHENSIVE METABOLIC PANEL (CC13)
ALK PHOS: 64 U/L (ref 40–150)
ALT: 7 U/L (ref 0–55)
AST: 10 U/L (ref 5–34)
Albumin: 2.7 g/dL — ABNORMAL LOW (ref 3.5–5.0)
Anion Gap: 11 mEq/L (ref 3–11)
BUN: 8.8 mg/dL (ref 7.0–26.0)
CALCIUM: 9.2 mg/dL (ref 8.4–10.4)
CHLORIDE: 108 meq/L (ref 98–109)
CO2: 25 mEq/L (ref 22–29)
Creatinine: 0.6 mg/dL — ABNORMAL LOW (ref 0.7–1.3)
Glucose: 93 mg/dl (ref 70–140)
POTASSIUM: 3.3 meq/L — AB (ref 3.5–5.1)
SODIUM: 144 meq/L (ref 136–145)
TOTAL PROTEIN: 5.9 g/dL — AB (ref 6.4–8.3)
Total Bilirubin: 0.71 mg/dL (ref 0.20–1.20)

## 2014-02-28 LAB — CBC WITH DIFFERENTIAL/PLATELET
BASO%: 0.4 % (ref 0.0–2.0)
BASOS ABS: 0 10*3/uL (ref 0.0–0.1)
EOS ABS: 0.1 10*3/uL (ref 0.0–0.5)
EOS%: 2.5 % (ref 0.0–7.0)
HCT: 28.3 % — ABNORMAL LOW (ref 38.4–49.9)
HGB: 9 g/dL — ABNORMAL LOW (ref 13.0–17.1)
LYMPH%: 7.8 % — ABNORMAL LOW (ref 14.0–49.0)
MCH: 29.3 pg (ref 27.2–33.4)
MCHC: 31.7 g/dL — ABNORMAL LOW (ref 32.0–36.0)
MCV: 92.2 fL (ref 79.3–98.0)
MONO#: 0.7 10*3/uL (ref 0.1–0.9)
MONO%: 14 % (ref 0.0–14.0)
NEUT%: 75.3 % — ABNORMAL HIGH (ref 39.0–75.0)
NEUTROS ABS: 4 10*3/uL (ref 1.5–6.5)
Platelets: 102 10*3/uL — ABNORMAL LOW (ref 140–400)
RBC: 3.07 10*6/uL — ABNORMAL LOW (ref 4.20–5.82)
RDW: 21 % — AB (ref 11.0–14.6)
WBC: 5.3 10*3/uL (ref 4.0–10.3)
lymph#: 0.4 10*3/uL — ABNORMAL LOW (ref 0.9–3.3)

## 2014-02-28 LAB — POCT INR: INR: 3

## 2014-02-28 LAB — TECHNOLOGIST REVIEW

## 2014-02-28 LAB — PROTIME-INR
INR: 3 (ref 2.00–3.50)
PROTIME: 36 s — AB (ref 10.6–13.4)

## 2014-02-28 MED ORDER — ONDANSETRON 8 MG/NS 50 ML IVPB
INTRAVENOUS | Status: AC
  Filled 2014-02-28: qty 8

## 2014-02-28 MED ORDER — DEXAMETHASONE SODIUM PHOSPHATE 10 MG/ML IJ SOLN
10.0000 mg | Freq: Once | INTRAMUSCULAR | Status: AC
  Administered 2014-02-28: 10 mg via INTRAVENOUS

## 2014-02-28 MED ORDER — DEXAMETHASONE SODIUM PHOSPHATE 10 MG/ML IJ SOLN
INTRAMUSCULAR | Status: AC
  Filled 2014-02-28: qty 1

## 2014-02-28 MED ORDER — ONDANSETRON 8 MG/50ML IVPB (CHCC)
8.0000 mg | Freq: Once | INTRAVENOUS | Status: AC
  Administered 2014-02-28: 8 mg via INTRAVENOUS

## 2014-02-28 MED ORDER — HEPARIN SOD (PORK) LOCK FLUSH 100 UNIT/ML IV SOLN
500.0000 [IU] | Freq: Once | INTRAVENOUS | Status: AC | PRN
  Administered 2014-02-28: 500 [IU]
  Filled 2014-02-28: qty 5

## 2014-02-28 MED ORDER — DEXTROSE 5 % IV SOLN
56.0000 mg/m2 | Freq: Once | INTRAVENOUS | Status: AC
  Administered 2014-02-28: 118 mg via INTRAVENOUS
  Filled 2014-02-28: qty 59

## 2014-02-28 MED ORDER — SODIUM CHLORIDE 0.9 % IJ SOLN
10.0000 mL | INTRAMUSCULAR | Status: DC | PRN
  Administered 2014-02-28: 10 mL
  Filled 2014-02-28: qty 10

## 2014-02-28 MED ORDER — SODIUM CHLORIDE 0.9 % IV SOLN
Freq: Once | INTRAVENOUS | Status: AC
  Administered 2014-02-28: 09:00:00 via INTRAVENOUS

## 2014-02-28 NOTE — Progress Notes (Signed)
Per Dr Julien Nordmann it is okay to proceed with chemotherapy today and  CMET from 02/21/14.

## 2014-02-28 NOTE — Patient Instructions (Signed)
[facility] Discharge Instructions for Patients Receiving Chemotherapy  Today you received the following chemotherapy agents Kyprolis To help prevent nausea and vomiting after your treatment, we encourage you to take your nausea medication as prescribed.  If you develop nausea and vomiting that is not controlled by your nausea medication, call the clinic.   BELOW ARE SYMPTOMS THAT SHOULD BE REPORTED IMMEDIATELY:  *FEVER GREATER THAN 100.5 F  *CHILLS WITH OR WITHOUT FEVER  NAUSEA AND VOMITING THAT IS NOT CONTROLLED WITH YOUR NAUSEA MEDICATION  *UNUSUAL SHORTNESS OF BREATH  *UNUSUAL BRUISING OR BLEEDING  TENDERNESS IN MOUTH AND THROAT WITH OR WITHOUT PRESENCE OF ULCERS  *URINARY PROBLEMS  *BOWEL PROBLEMS  UNUSUAL RASH Items with * indicate a potential emergency and should be followed up as soon as possible.  Feel free to call the clinic you have any questions or concerns. The clinic phone number is [phone].    

## 2014-02-28 NOTE — Patient Instructions (Signed)
Continue 5 mg daily except for 7.5 mg on Mondays.   Recheck PT/INR with your appt on 03/07/14

## 2014-02-28 NOTE — Progress Notes (Signed)
Pt seen during infusion today INR=3.0  Continue 5 mg daily except for 7.5 mg on Mondays.  No changes to report, no new meds Recheck PT/INR with your appts on 03/07/14

## 2014-03-01 ENCOUNTER — Ambulatory Visit (HOSPITAL_BASED_OUTPATIENT_CLINIC_OR_DEPARTMENT_OTHER): Payer: 59

## 2014-03-01 VITALS — BP 169/85 | HR 67 | Temp 97.9°F | Resp 18

## 2014-03-01 DIAGNOSIS — C9000 Multiple myeloma not having achieved remission: Secondary | ICD-10-CM

## 2014-03-01 DIAGNOSIS — Z5112 Encounter for antineoplastic immunotherapy: Secondary | ICD-10-CM

## 2014-03-01 MED ORDER — HEPARIN SOD (PORK) LOCK FLUSH 100 UNIT/ML IV SOLN
500.0000 [IU] | Freq: Once | INTRAVENOUS | Status: AC | PRN
  Administered 2014-03-01: 500 [IU]
  Filled 2014-03-01: qty 5

## 2014-03-01 MED ORDER — DEXAMETHASONE SODIUM PHOSPHATE 10 MG/ML IJ SOLN
10.0000 mg | Freq: Once | INTRAMUSCULAR | Status: AC
  Administered 2014-03-01: 10 mg via INTRAVENOUS

## 2014-03-01 MED ORDER — SODIUM CHLORIDE 0.9 % IJ SOLN
10.0000 mL | INTRAMUSCULAR | Status: DC | PRN
  Administered 2014-03-01: 10 mL
  Filled 2014-03-01: qty 10

## 2014-03-01 MED ORDER — DEXAMETHASONE SODIUM PHOSPHATE 10 MG/ML IJ SOLN
INTRAMUSCULAR | Status: AC
  Filled 2014-03-01: qty 1

## 2014-03-01 MED ORDER — SODIUM CHLORIDE 0.9 % IV SOLN
Freq: Once | INTRAVENOUS | Status: AC
  Administered 2014-03-01: 09:00:00 via INTRAVENOUS

## 2014-03-01 MED ORDER — DEXTROSE 5 % IV SOLN
56.0000 mg/m2 | Freq: Once | INTRAVENOUS | Status: AC
  Administered 2014-03-01: 118 mg via INTRAVENOUS
  Filled 2014-03-01: qty 59

## 2014-03-01 MED ORDER — ONDANSETRON 8 MG/NS 50 ML IVPB
INTRAVENOUS | Status: AC
  Filled 2014-03-01: qty 8

## 2014-03-01 MED ORDER — ONDANSETRON 8 MG/50ML IVPB (CHCC)
8.0000 mg | Freq: Once | INTRAVENOUS | Status: AC
  Administered 2014-03-01: 8 mg via INTRAVENOUS

## 2014-03-01 NOTE — Patient Instructions (Signed)
[facility] Discharge Instructions for Patients Receiving Chemotherapy  Today you received the following chemotherapy agents:  Kyprolis  To help prevent nausea and vomiting after your treatment, we encourage you to take your nausea medication as ordered per MD.   If you develop nausea and vomiting that is not controlled by your nausea medication, call the clinic.   BELOW ARE SYMPTOMS THAT SHOULD BE REPORTED IMMEDIATELY:  *FEVER GREATER THAN 100.5 F  *CHILLS WITH OR WITHOUT FEVER  NAUSEA AND VOMITING THAT IS NOT CONTROLLED WITH YOUR NAUSEA MEDICATION  *UNUSUAL SHORTNESS OF BREATH  *UNUSUAL BRUISING OR BLEEDING  TENDERNESS IN MOUTH AND THROAT WITH OR WITHOUT PRESENCE OF ULCERS  *URINARY PROBLEMS  *BOWEL PROBLEMS  UNUSUAL RASH Items with * indicate a potential emergency and should be followed up as soon as possible.  Feel free to call the clinic you have any questions or concerns. The clinic phone number is [phone].

## 2014-03-07 ENCOUNTER — Ambulatory Visit (HOSPITAL_BASED_OUTPATIENT_CLINIC_OR_DEPARTMENT_OTHER): Payer: 59 | Admitting: Physician Assistant

## 2014-03-07 ENCOUNTER — Ambulatory Visit: Payer: 59

## 2014-03-07 ENCOUNTER — Ambulatory Visit (HOSPITAL_BASED_OUTPATIENT_CLINIC_OR_DEPARTMENT_OTHER): Payer: 59 | Admitting: Pharmacist

## 2014-03-07 ENCOUNTER — Telehealth: Payer: Self-pay | Admitting: Internal Medicine

## 2014-03-07 ENCOUNTER — Other Ambulatory Visit (HOSPITAL_BASED_OUTPATIENT_CLINIC_OR_DEPARTMENT_OTHER): Payer: 59

## 2014-03-07 ENCOUNTER — Ambulatory Visit (HOSPITAL_BASED_OUTPATIENT_CLINIC_OR_DEPARTMENT_OTHER): Payer: 59

## 2014-03-07 ENCOUNTER — Encounter: Payer: Self-pay | Admitting: Physician Assistant

## 2014-03-07 VITALS — BP 178/83 | HR 68 | Temp 98.2°F | Resp 19 | Ht 63.0 in | Wt 244.0 lb

## 2014-03-07 DIAGNOSIS — M899 Disorder of bone, unspecified: Secondary | ICD-10-CM

## 2014-03-07 DIAGNOSIS — C9002 Multiple myeloma in relapse: Secondary | ICD-10-CM

## 2014-03-07 DIAGNOSIS — I776 Arteritis, unspecified: Secondary | ICD-10-CM

## 2014-03-07 DIAGNOSIS — C9000 Multiple myeloma not having achieved remission: Secondary | ICD-10-CM

## 2014-03-07 DIAGNOSIS — M949 Disorder of cartilage, unspecified: Secondary | ICD-10-CM

## 2014-03-07 DIAGNOSIS — R5383 Other fatigue: Secondary | ICD-10-CM

## 2014-03-07 DIAGNOSIS — R5381 Other malaise: Secondary | ICD-10-CM

## 2014-03-07 DIAGNOSIS — Z5112 Encounter for antineoplastic immunotherapy: Secondary | ICD-10-CM

## 2014-03-07 LAB — PROTHROMBIN TIME
INR: 5.64 — AB (ref <1.50)
Prothrombin Time: 48.7 seconds — ABNORMAL HIGH (ref 11.6–15.2)

## 2014-03-07 LAB — CBC WITH DIFFERENTIAL/PLATELET
BASO%: 0 % (ref 0.0–2.0)
BASOS ABS: 0 10*3/uL (ref 0.0–0.1)
EOS%: 0.5 % (ref 0.0–7.0)
Eosinophils Absolute: 0 10*3/uL (ref 0.0–0.5)
HCT: 28.4 % — ABNORMAL LOW (ref 38.4–49.9)
HEMOGLOBIN: 9 g/dL — AB (ref 13.0–17.1)
LYMPH%: 5.9 % — ABNORMAL LOW (ref 14.0–49.0)
MCH: 29.6 pg (ref 27.2–33.4)
MCHC: 31.7 g/dL — AB (ref 32.0–36.0)
MCV: 93.4 fL (ref 79.3–98.0)
MONO#: 1.4 10*3/uL — ABNORMAL HIGH (ref 0.1–0.9)
MONO%: 15 % — AB (ref 0.0–14.0)
NEUT#: 7.3 10*3/uL — ABNORMAL HIGH (ref 1.5–6.5)
NEUT%: 78.6 % — AB (ref 39.0–75.0)
Platelets: 117 10*3/uL — ABNORMAL LOW (ref 140–400)
RBC: 3.04 10*6/uL — ABNORMAL LOW (ref 4.20–5.82)
RDW: 21.6 % — AB (ref 11.0–14.6)
WBC: 9.3 10*3/uL (ref 4.0–10.3)
lymph#: 0.6 10*3/uL — ABNORMAL LOW (ref 0.9–3.3)

## 2014-03-07 LAB — COMPREHENSIVE METABOLIC PANEL (CC13)
ALT: 12 U/L (ref 0–55)
ANION GAP: 11 meq/L (ref 3–11)
AST: 8 U/L (ref 5–34)
Albumin: 2.8 g/dL — ABNORMAL LOW (ref 3.5–5.0)
Alkaline Phosphatase: 75 U/L (ref 40–150)
BUN: 16.7 mg/dL (ref 7.0–26.0)
CO2: 22 meq/L (ref 22–29)
CREATININE: 0.7 mg/dL (ref 0.7–1.3)
Calcium: 8.7 mg/dL (ref 8.4–10.4)
Chloride: 113 mEq/L — ABNORMAL HIGH (ref 98–109)
Glucose: 118 mg/dl (ref 70–140)
Potassium: 3.9 mEq/L (ref 3.5–5.1)
Sodium: 146 mEq/L — ABNORMAL HIGH (ref 136–145)
Total Bilirubin: 0.61 mg/dL (ref 0.20–1.20)
Total Protein: 5.6 g/dL — ABNORMAL LOW (ref 6.4–8.3)

## 2014-03-07 LAB — PROTIME-INR

## 2014-03-07 LAB — POCT INR: INR: 5.64

## 2014-03-07 MED ORDER — ONDANSETRON 8 MG/NS 50 ML IVPB
INTRAVENOUS | Status: AC
  Filled 2014-03-07: qty 8

## 2014-03-07 MED ORDER — DEXTROSE 5 % IV SOLN
56.0000 mg/m2 | Freq: Once | INTRAVENOUS | Status: AC
  Administered 2014-03-07: 118 mg via INTRAVENOUS
  Filled 2014-03-07: qty 59

## 2014-03-07 MED ORDER — DEXAMETHASONE SODIUM PHOSPHATE 10 MG/ML IJ SOLN
10.0000 mg | Freq: Once | INTRAMUSCULAR | Status: AC
  Administered 2014-03-07: 10 mg via INTRAVENOUS

## 2014-03-07 MED ORDER — SODIUM CHLORIDE 0.9 % IV SOLN
Freq: Once | INTRAVENOUS | Status: AC
  Administered 2014-03-07: 10:00:00 via INTRAVENOUS

## 2014-03-07 MED ORDER — ONDANSETRON 8 MG/50ML IVPB (CHCC)
8.0000 mg | Freq: Once | INTRAVENOUS | Status: AC
  Administered 2014-03-07: 8 mg via INTRAVENOUS

## 2014-03-07 MED ORDER — SODIUM CHLORIDE 0.9 % IJ SOLN
10.0000 mL | INTRAMUSCULAR | Status: DC | PRN
  Administered 2014-03-07: 10 mL
  Filled 2014-03-07: qty 10

## 2014-03-07 MED ORDER — HEPARIN SOD (PORK) LOCK FLUSH 100 UNIT/ML IV SOLN
500.0000 [IU] | Freq: Once | INTRAVENOUS | Status: AC | PRN
Start: 2014-03-07 — End: 2014-03-07
  Administered 2014-03-07: 500 [IU]
  Filled 2014-03-07: qty 5

## 2014-03-07 MED ORDER — DEXAMETHASONE SODIUM PHOSPHATE 10 MG/ML IJ SOLN
INTRAMUSCULAR | Status: AC
  Filled 2014-03-07: qty 1

## 2014-03-07 MED ORDER — ZOLEDRONIC ACID 4 MG/100ML IV SOLN
4.0000 mg | Freq: Once | INTRAVENOUS | Status: AC
  Administered 2014-03-07: 4 mg via INTRAVENOUS
  Filled 2014-03-07: qty 100

## 2014-03-07 NOTE — Patient Instructions (Signed)
Return in one week to have repeat protein studies drawn to reevaluate your disease Followup in 2 weeks prior to the start of her next scheduled cycle of chemotherapy and to discuss the results of your repeat protein studies Continue with labs and chemotherapy as scheduled

## 2014-03-07 NOTE — Progress Notes (Signed)
[place] [phone]   [phone]  SHARED VISIT PROGRESS NOTE  [name]., MD [address] [place] [address]  Principle Diagnosis:  #1 recurrent multiple myeloma IgG kappa subtype diagnosed in June of 2008  #2 history of vasculitis and thrombosis of skin lesions   Prior Therapy:  #1 status post palliative radiotherapy to the left hip under the care of Dr. Sondra Come  #2 status post 5 cycles of systemic chemotherapy with Revlimid and low dose Decadron. Last dose given June 2009 with good response.  #3 status post autologous peripheral blood stem cell transplant at [facility] on 02/02/2008.  #4 the patient had evidence for disease recurrence in December 2010.  #5 Revlimid 25 mg by mouth daily for 21 days every 4 weeks in addition to Decadron 40 mg orally on a weekly basis. The patient is status post 28 cycles, discontinued today secondary to disease progression.  #6 Systemic chemotherapy with Velcade 1.3 mg/M2 on days 1, 4, 8 and 11 in addition to Doxil 30 mg/M2 on day 4 and Decadron 40 mg by mouth on weekly basis every 3 weeks. Status post 3 cycles, last dose was given 05/04/2012 discontinued secondary to intolerance.  #7 Salvage therapy treatment with the Pomalyst 4 mg by mouth daily for 21 days every 28 days as well as Cytoxan 50 mg by mouth every other day and dexamethasone 40 mg by mouth once weekly. Therapy beginning 09/25/2012, status post 2 cycles discontinued secondary to disease progression and intolerance.  #8 Kyprolis (Carfilzomib) 27 mg/M2 on days 1, 2, 8, 9, 15 and 16 every 4 weeks. First dose on 02/08/2013. This would be concurrent with the dexamethasone 40 mg by mouth on a weekly basis. Status post 3 cycles with disease progression.  #9 Systemic chemotherapy with Carfilzomib 27 mg/M2 days 1,2 , 8, 9, 15 and 16, Cytoxan 300 mg/M2 and dexamethasone 40 mg by mouth weekly every 4 weeks. Status post 4  cycles. Last dose was given 08/09/2013 discontinued today secondary to disease progression.  Current therapy:  1) Systemic chemotherapy with Carfilzomib 56 mg/M2 days 1,2 , 8, 9, 15 and 16, Pomalyst 3 mg by mouth daily for 21 days every 4 weeks and dexamethasone 40 mg by mouth weekly every 4 weeks. First cycle started on 10/05/2013. The patient missed day 8 and 9 of the first cycle secondary to thrombocytopenia. Status post 5 cycles.  2) Zometa 4 mg IV given every 3 months.    INTERVAL HISTORY: [name] 53 y.o. male returns to the clinic today for a work in visit accompanied by his wife.  He tolerated the last cycle of his treatment with Carfilzomib, Pomalyst and dexamethasone fairly well except for increasing fatigue and pain in the left lower extremity and right arm. Dear using a walker at home at times to help with ambulation. He continues to complain of right lower extremity pain particularly in the shin region. He also continues to have bilateral lower extremity edema primarily of the foot and ankle region. He recently had a bone skeletal survey performed and presents to discuss the results. He also presents to proceed with day 15 of cycle #6.  He denies any fever, chills, diarrhea or constipation. He has no chest pain, shortness of breath, cough or hemoptysis. The patient denied having any nausea or vomiting. He has no weight loss or night sweats. He denies any numbness or tingling or weakness and lower extremities.  He has full control of his bowel and bladder habits. He reports his pain is better controlled now that he is taking his pain medication on a more regular/scheduled basis. He is taking his MS Contin 30 mg every 12 hours as prescribed and takes an average of 2 hydrocodone 10/325 mg tablets per day with reasonable control of his pain.  MEDICAL HISTORY: Past Medical History  Diagnosis Date  . Multiple myeloma 09/24/2011  . Hypertension     ALLERGIES:  is allergic to red dye and  other.  MEDICATIONS:  Current Outpatient Prescriptions  Medication Sig Dispense Refill  . amLODipine (NORVASC) 10 MG tablet Take 10 mg by mouth daily.      . benzonatate (TESSALON) 200 MG capsule Take 1 capsule (200 mg total) by mouth 3 (three) times daily as needed for cough.  20 capsule  0  . chlorpheniramine-HYDROcodone (TUSSIONEX) 10-8 MG/5ML LQCR Take 5 mLs by mouth every 12 (twelve) hours as needed for cough.  115 mL  0  . dexamethasone (DECADRON) 4 MG tablet Take 10 tablets (40 mg total) by mouth once a week. Mondays with chemo  40 tablet  2  . furosemide (LASIX) 20 MG tablet Take 39m (1 tablet) daily, alternating with 487m(2 tablets)  every other day.  45 tablet  0  . HYDROcodone-acetaminophen (NORCO) 10-325 MG per tablet Take 1-2 tablets by mouth every 6 (six) hours as needed. For pain.  40 tablet  0  . lidocaine-prilocaine (EMLA) cream Apply 1 application topically as needed.  30 g  0  . morphine (MS CONTIN) 30 MG 12 hr tablet Take 1 tablet (30 mg total) by mouth every 12 (twelve) hours as needed for pain.  60 tablet  0  . Nebivolol HCl (BYSTOLIC) 20 MG TABS Take 30 mg by mouth daily.      . Marland Kitchenmeprazole (PRILOSEC) 40 MG capsule Take 40 mg by mouth daily as needed (for heartburn).       . pomalidomide (POMALYST) 3 MG capsule Take 1 capsule (3 mg total) by mouth daily. Take with water on days 1-21. Repeat every 28 days.   AUTH # [id number] 02/01/14  21 capsule  0  . potassium chloride SA (K-DUR,KLOR-CON) 20 MEQ tablet TAKE 1 TABLET BY MOUTH EVERY DAY  30 tablet  0  . pregabalin (LYRICA) 50 MG capsule Take 1 capsule (50 mg total) by mouth 3 (three) times daily.  270 capsule  1  . PRESCRIPTION MEDICATION [facility] Mo[facility]arlie Server    . prochlorperazine (COMPAZINE) 10 MG tablet Take 10 mg by mouth every 6 (six) hours as needed for nausea.      . Marland Kitchenarfarin (COUMADIN) 5 MG tablet Take 1-1.5 tablets (5-7.5 mg total) by mouth daily. Take 5 mg by mouth daily except  7.5 mg on Mondays  50 tablet  1  . oxyCODONE-acetaminophen (PERCOCET/ROXICET) 5-325 MG per tablet Take 1 tablet by mouth every 6 (six) hours as needed for moderate pain.       No current facility-administered medications for this visit.   Facility-Administered Medications Ordered in Other Visits  Medication Dose Route Frequency Provider Last Rate Last Dose  . 0.9 %  sodium chloride infusion   Intravenous Once Adrena E Johnson, PA-C      . carfilzomib (KYPROLIS) 118 mg in dextrose 5 % 50 mL chemo infusion  56 mg/m2 (Treatment Plan Actual) Intravenous Once MoCurt BearsMD      . heparin  lock flush 100 unit/mL  500 Units Intracatheter Once PRN Curt Bears, MD      . ondansetron (ZOFRAN) IVPB 8 mg  8 mg Intravenous Once Curt Bears, MD   8 mg at 03/07/14 1001  . sodium chloride 0.9 % injection 10 mL  10 mL Intracatheter PRN Curt Bears, MD   10 mL at 07/27/13 1617  . sodium chloride 0.9 % injection 10 mL  10 mL Intracatheter PRN Odilon Cass E Martez Weiand, PA-C      . sodium chloride 0.9 % injection 10 mL  10 mL Intracatheter PRN Curt Bears, MD        SURGICAL HISTORY:  Past Surgical History  Procedure Laterality Date  . Video bronchoscopy  12/11/2012    Procedure: VIDEO BRONCHOSCOPY;  Surgeon: Ivin Poot, MD;  Location: [place];  Service: Thoracic;  Laterality: N/A;  . Video assisted thoracoscopy  12/11/2012    Procedure: VIDEO ASSISTED THORACOSCOPY;  Surgeon: Ivin Poot, MD;  Location: [place];  Service: Thoracic;  Laterality: Right;  . Decortication  12/11/2012    Procedure: DECORTICATION;  Surgeon: Ivin Poot, MD;  Location[facility] OR;  Service: Thoracic;  Laterality: N/A;    REVIEW OF SYSTEMS:  Constitutional: negative Eyes: negative Ears, nose, mouth, throat, and face: negative Respiratory: negative Cardiovascular: positive for lower extremity edema Gastrointestinal: negative Genitourinary:negative Integument/breast: negative Hematologic/lymphatic:  negative Musculoskeletal:negative Neurological: negative Behavioral/Psych: negative Endocrine: negative Allergic/Immunologic: negative   PHYSICAL EXAMINATION: General appearance: alert, cooperative, fatigued and no distress Head: Normocephalic, without obvious abnormality, atraumatic, Nontender Neck: no adenopathy, no JVD, supple, symmetrical, trachea midline and thyroid not enlarged, symmetric, no tenderness/mass/nodules Lymph nodes: Cervical, supraclavicular, and axillary nodes normal. Resp: clear to auscultation bilaterally Back: symmetric, no curvature. ROM normal. No CVA tenderness. Cardio: regular rate and rhythm, S1, S2 normal, no murmur, click, rub or gallop GI: soft, non-tender; bowel sounds normal; no masses,  no organomegaly Extremities: edema 1+ - 2+ pitting edema bilateral lower extremities Neurologic: Alert and oriented X 3, normal strength and tone. Normal symmetric reflexes. Normal coordination and gait  ECOG PERFORMANCE STATUS: 1 - Symptomatic but completely ambulatory  Blood pressure 178/83, pulse 68, temperature 98.2 F (36.8 C), temperature source Oral, resp. rate 19, height 5' 3" (1.6 m), weight 244 lb (110.678 kg), SpO2 100.00%.  LABORATORY DATA: Lab Results  Component Value Date   WBC 9.3 03/07/2014   HGB 9.0* 03/07/2014   HCT 28.4* 03/07/2014   MCV 93.4 03/07/2014   PLT 117* 03/07/2014      Chemistry      Component Value Date/Time   NA 146* 03/07/2014 0836   NA 136 03/16/2013 1930   K 3.9 03/07/2014 0836   K 3.8 03/16/2013 1930   CL 112* 05/10/2013 0849   CL 110 03/16/2013 1930   CO2 22 03/07/2014 0836   CO2 16* 03/16/2013 1930   BUN 16.7 03/07/2014 0836   BUN 22 03/16/2013 1930   CREATININE 0.7 03/07/2014 0836   CREATININE 1.24 03/16/2013 1930      Component Value Date/Time   CALCIUM 8.7 03/07/2014 0836   CALCIUM 8.2* 03/16/2013 1930   ALKPHOS 75 03/07/2014 0836   ALKPHOS 31* 03/16/2013 1930   AST 8 03/07/2014 0836   AST 11 03/16/2013 1930   ALT 12 03/07/2014  0836   ALT 15 03/16/2013 1930   BILITOT 0.61 03/07/2014 0836   BILITOT 0.2* 03/16/2013 1930     Radiology: Dg Bone Survey Met  02/24/2014   CLINICAL DATA:  Multiple  Myeloma, Staging  EXAM: METASTATIC BONE SURVEY  COMPARISON:  DG BONE SURVEY MET dated 12/02/2011  FINDINGS: Lateral skull single-view: The scattered small lucencies within the skull stable.  Cervical fluid spine two-view: Visualized portions cervical spine demonstrate no new lytic foci.  Two view thoracic spine: Stable chronic endplate deformity T2 without evidence of lytic of appearing foci. Degenerative disc disease changes at the T9-10 level.  Two view lumbar spine: There has been interval development of compression deformities involving L1 and L2. Age indeterminate. No lytic foci.  Left shoulder and humerus: There are areas concerning for very small scattered lytic foci within the humerus.  Right shoulder and humerus: Findings concerning for small scattered lytic foci within the humerus.  Right femur:  No lytic foci  Left femur:  No lytic foci  Pelvis: There has been slight increase in the lytic foci involving the left iliac wing and left portion of the sacrum. Degenerative changes also appreciated involving the right and left hips and the sacroiliac regions.  Frontal view of the chest demonstrates hyperinflation. A fracture with overriding nonunion appreciated involving posterior aspect of the sixth rib on the right. Age indeterminate. No focal regions of consolidation or focal infiltrates.  IMPRESSION: Slight interval progression of disease within the left portion of the pelvis and sacrum.  Findings concerning for progression of disease within the right and left humeral head regions.  The disease within the skull appears stable.  Compression deformities involving L1 and L2 which have occurred in the interim age indeterminate.  Age indeterminate rib fracture involving the sixth rib on the right.  The above described fractures may represent  posttraumatic events with no areas of pathologic fracture is in the patient's history cannot be excluded.   Electronically Signed   By: Margaree Mackintosh M.D.   On: 02/24/2014 08:43   ASSESSMENT AND PLAN: The patient is a very pleasant 53 years old African American male with recurrent multiple myeloma currently on treatment with Carfilzomib 56 mg/M2, Pomalyst 3 mg by mouth daily for 21 days every 3 weeks in addition to Decadron 40 mg on a weekly basis. He is status post 5 cycles, as well as days 1, 2, 8, and 9 of cycle #6.   The patient is tolerating his treatment fairly well with no significant adverse effects except for the generalized fatigue and bone pain. The patient was discussed with and also seen by Dr. Julien Nordmann. His bone survey revealed some progression of the lytic lesions in the pelvis and sacrum as well as L1-L2 compression fracture. He has some arthritic changes at T9-T10. There are also some small lytic lesions in humerus bilaterally. Additionally there is an age indeterminate right sixth rib fracture. We'll refer the patient back to Dr. Freddi Che for consideration of palliative radiotherapy to in the areas that corresponded with lytic lesions. His labs were reviewed and are in treatable range. He will continue on his chemotherapy with no dose reductions. Plan to get a restaging protein studies drawn in one week and have him followup with Dr. Julien Nordmann in 2 weeks prior to the start of cycle #7 to discuss the results of the restaging protein studies. He was advised to call immediately if he has any concerning symptoms in the interval.  He was advised to call immediately if she has any concerning symptoms in the interval. The patient voices understanding of current disease status and treatment options and is in agreement with the current care plan.  All questions were answered. The patient knows  to call the clinic with any problems, questions or concerns. We can certainly see the patient much sooner if  necessary.  Disclaimer: This note was dictated with voice recognition software. Similar sounding words can inadvertently be transcribed and may not be corrected upon review.   Carlton Adam, PA-C 03/07/2014  ADDENDUM: Hematology/Oncology Attending: I had a face to face encounter with the patient. I recommended his care plan. He is a very pleasant 53 years old [place] American male with multiple myeloma currently undergoing systemic chemotherapy with Carfilzomib, Pomalyst and Decadron status post 5 cycles and he is currently undergoing cycle #6. The patient history rating his treatment fairly well with no significant adverse effects. His recent skeletal bone survey showed some progression of lytic lesions in the pelvis and sacrum as well as L1-L2 compression fractures. I will refer the patient to Dr. Sondra Come for evaluation and discussion of any palliative radiotherapy to these areas. I would see him back for followup visit in 2 weeks after repeating myeloma panel for evaluation of his disease before starting cycle #7. He was advised to call immediately if he has any concerning symptoms in the interval.  Disclaimer: This note was dictated with voice recognition software. Similar sounding words can inadvertently be transcribed and may not be corrected upon review. Curt Bears, MD 03/08/2014

## 2014-03-07 NOTE — Progress Notes (Signed)
Pt seen during infusion today INR was sent out with confirmation of 5.64 Plan to hold dose for 2 days and resume Wed at 5mg  daily Patient has made no other changes in diet or meds He returns  for routine lab on Mon 03/14/14 and we will see in CC that day. He does not have an infusion appmt that day and request we get him back as quick as possible Lab at 8:15 and CC at 8:30

## 2014-03-07 NOTE — Telephone Encounter (Signed)
gv adnprinted appt sched and avs for pt for April adn May.....sed added tx. °

## 2014-03-07 NOTE — Patient Instructions (Signed)
Hold Coumadin for 2 days then resume at 5 mg daily except for 7.5 mg on Mondays.   Recheck PT/INR with your appt on 03/14/14

## 2014-03-07 NOTE — Patient Instructions (Signed)
[facility] Discharge Instructions for Patients Receiving Chemotherapy  Today you received the following chemotherapy agents Kyprolis and Zometa.  To help prevent nausea and vomiting after your treatment, we encourage you to take your nausea medication.   If you develop nausea and vomiting that is not controlled by your nausea medication, call the clinic.   BELOW ARE SYMPTOMS THAT SHOULD BE REPORTED IMMEDIATELY:  *FEVER GREATER THAN 100.5 F  *CHILLS WITH OR WITHOUT FEVER  NAUSEA AND VOMITING THAT IS NOT CONTROLLED WITH YOUR NAUSEA MEDICATION  *UNUSUAL SHORTNESS OF BREATH  *UNUSUAL BRUISING OR BLEEDING  TENDERNESS IN MOUTH AND THROAT WITH OR WITHOUT PRESENCE OF ULCERS  *URINARY PROBLEMS  *BOWEL PROBLEMS  UNUSUAL RASH Items with * indicate a potential emergency and should be followed up as soon as possible.  Feel free to call the clinic you have any questions or concerns. The clinic phone number is [phone].    

## 2014-03-08 ENCOUNTER — Ambulatory Visit (HOSPITAL_BASED_OUTPATIENT_CLINIC_OR_DEPARTMENT_OTHER): Payer: 59

## 2014-03-08 ENCOUNTER — Encounter: Payer: Self-pay | Admitting: Internal Medicine

## 2014-03-08 VITALS — BP 165/91 | HR 56 | Temp 98.4°F | Resp 20

## 2014-03-08 DIAGNOSIS — Z5112 Encounter for antineoplastic immunotherapy: Secondary | ICD-10-CM

## 2014-03-08 DIAGNOSIS — C9000 Multiple myeloma not having achieved remission: Secondary | ICD-10-CM

## 2014-03-08 MED ORDER — DEXAMETHASONE SODIUM PHOSPHATE 10 MG/ML IJ SOLN
10.0000 mg | Freq: Once | INTRAMUSCULAR | Status: AC
Start: 2014-03-08 — End: 2014-03-08
  Administered 2014-03-08: 10 mg via INTRAVENOUS

## 2014-03-08 MED ORDER — ONDANSETRON 8 MG/50ML IVPB (CHCC)
8.0000 mg | Freq: Once | INTRAVENOUS | Status: AC
Start: 2014-03-08 — End: 2014-03-08
  Administered 2014-03-08: 8 mg via INTRAVENOUS

## 2014-03-08 MED ORDER — ONDANSETRON 8 MG/NS 50 ML IVPB
INTRAVENOUS | Status: AC
  Filled 2014-03-08: qty 8

## 2014-03-08 MED ORDER — DEXTROSE 5 % IV SOLN
56.0000 mg/m2 | Freq: Once | INTRAVENOUS | Status: AC
  Administered 2014-03-08: 118 mg via INTRAVENOUS
  Filled 2014-03-08: qty 59

## 2014-03-08 MED ORDER — SODIUM CHLORIDE 0.9 % IV SOLN
Freq: Once | INTRAVENOUS | Status: AC
Start: 2014-03-08 — End: 2014-03-08
  Administered 2014-03-08: 09:00:00 via INTRAVENOUS

## 2014-03-08 MED ORDER — HEPARIN SOD (PORK) LOCK FLUSH 100 UNIT/ML IV SOLN
500.0000 [IU] | Freq: Once | INTRAVENOUS | Status: AC | PRN
Start: 2014-03-08 — End: 2014-03-08
  Administered 2014-03-08: 500 [IU]
  Filled 2014-03-08: qty 5

## 2014-03-08 MED ORDER — SODIUM CHLORIDE 0.9 % IJ SOLN
10.0000 mL | INTRAMUSCULAR | Status: DC | PRN
  Administered 2014-03-08: 10 mL
  Filled 2014-03-08: qty 10

## 2014-03-08 MED ORDER — DEXAMETHASONE SODIUM PHOSPHATE 10 MG/ML IJ SOLN
INTRAMUSCULAR | Status: AC
  Filled 2014-03-08: qty 1

## 2014-03-08 NOTE — Patient Instructions (Signed)
[place] Discharge Instructions for Patients Receiving Chemotherapy  Today you received the following chemotherapy agents kyprolis To help prevent nausea and vomiting after your treatment, we encourage you to take your nausea medication ASPRESCRIBED.   If you develop nausea and vomiting that is not controlled by your nausea medication, call the clinic.   BELOW ARE SYMPTOMS THAT SHOULD BE REPORTED IMMEDIATELY:  *FEVER GREATER THAN 100.5 F  *CHILLS WITH OR WITHOUT FEVER  NAUSEA AND VOMITING THAT IS NOT CONTROLLED WITH YOUR NAUSEA MEDICATION  *UNUSUAL SHORTNESS OF BREATH  *UNUSUAL BRUISING OR BLEEDING  TENDERNESS IN MOUTH AND THROAT WITH OR WITHOUT PRESENCE OF ULCERS  *URINARY PROBLEMS  *BOWEL PROBLEMS  UNUSUAL RASH Items with * indicate a potential emergency and should be followed up as soon as possible.  Feel free to call the clinic you have any questions or concerns. The clinic phone number is [phone].

## 2014-03-08 NOTE — Progress Notes (Signed)
Put wife's fmla form on nurse's desk. °

## 2014-03-09 ENCOUNTER — Encounter: Payer: Self-pay | Admitting: Radiation Oncology

## 2014-03-10 ENCOUNTER — Telehealth: Payer: Self-pay | Admitting: Oncology

## 2014-03-10 ENCOUNTER — Ambulatory Visit
Admission: RE | Admit: 2014-03-10 | Discharge: 2014-03-10 | Disposition: A | Discharge status: Home / Self Care | Payer: 59 | Source: Ambulatory Visit | Attending: Radiation Oncology | Admitting: Radiation Oncology

## 2014-03-10 ENCOUNTER — Encounter: Payer: Self-pay | Admitting: Internal Medicine

## 2014-03-10 ENCOUNTER — Ambulatory Visit: Payer: 59

## 2014-03-10 DIAGNOSIS — C9000 Multiple myeloma not having achieved remission: Secondary | ICD-10-CM | POA: Insufficient documentation

## 2014-03-10 DIAGNOSIS — Z510 Encounter for antineoplastic radiation therapy: Secondary | ICD-10-CM | POA: Insufficient documentation

## 2014-03-10 HISTORY — DX: Personal history of irradiation: Z92.3

## 2014-03-10 NOTE — Progress Notes (Signed)
Faxed wife's fmla form to [employer] @ [phone].

## 2014-03-10 NOTE — Telephone Encounter (Signed)
Karel Jarvis regarding canceling his appointment for consult with Dr. Sondra Come today due to not being able to walk very well.  Ask that he call back at [phone].

## 2014-03-14 ENCOUNTER — Other Ambulatory Visit (HOSPITAL_BASED_OUTPATIENT_CLINIC_OR_DEPARTMENT_OTHER): Payer: 59

## 2014-03-14 ENCOUNTER — Ambulatory Visit (HOSPITAL_BASED_OUTPATIENT_CLINIC_OR_DEPARTMENT_OTHER): Payer: Self-pay | Admitting: Pharmacist

## 2014-03-14 DIAGNOSIS — I776 Arteritis, unspecified: Secondary | ICD-10-CM

## 2014-03-14 DIAGNOSIS — Z7901 Long term (current) use of anticoagulants: Secondary | ICD-10-CM

## 2014-03-14 DIAGNOSIS — C9002 Multiple myeloma in relapse: Secondary | ICD-10-CM

## 2014-03-14 DIAGNOSIS — C9000 Multiple myeloma not having achieved remission: Secondary | ICD-10-CM

## 2014-03-14 LAB — COMPREHENSIVE METABOLIC PANEL (CC13)
ALT: 9 U/L (ref 0–55)
ANION GAP: 11 meq/L (ref 3–11)
AST: 9 U/L (ref 5–34)
Albumin: 2.8 g/dL — ABNORMAL LOW (ref 3.5–5.0)
Alkaline Phosphatase: 47 U/L (ref 40–150)
BILIRUBIN TOTAL: 1 mg/dL (ref 0.20–1.20)
BUN: 8.8 mg/dL (ref 7.0–26.0)
CALCIUM: 9 mg/dL (ref 8.4–10.4)
CHLORIDE: 106 meq/L (ref 98–109)
CO2: 22 meq/L (ref 22–29)
CREATININE: 0.7 mg/dL (ref 0.7–1.3)
Glucose: 91 mg/dl (ref 70–140)
Potassium: 4.4 mEq/L (ref 3.5–5.1)
Sodium: 139 mEq/L (ref 136–145)
Total Protein: 5.7 g/dL — ABNORMAL LOW (ref 6.4–8.3)

## 2014-03-14 LAB — CBC WITH DIFFERENTIAL/PLATELET
BASO%: 0.5 % (ref 0.0–2.0)
Basophils Absolute: 0 10*3/uL (ref 0.0–0.1)
EOS ABS: 0.1 10*3/uL (ref 0.0–0.5)
EOS%: 2.2 % (ref 0.0–7.0)
HEMATOCRIT: 29.9 % — AB (ref 38.4–49.9)
HGB: 9.3 g/dL — ABNORMAL LOW (ref 13.0–17.1)
LYMPH%: 12.3 % — AB (ref 14.0–49.0)
MCH: 29.4 pg (ref 27.2–33.4)
MCHC: 31.2 g/dL — AB (ref 32.0–36.0)
MCV: 94.3 fL (ref 79.3–98.0)
MONO#: 1.2 10*3/uL — ABNORMAL HIGH (ref 0.1–0.9)
MONO%: 31.9 % — AB (ref 0.0–14.0)
NEUT%: 53.1 % (ref 39.0–75.0)
NEUTROS ABS: 2 10*3/uL (ref 1.5–6.5)
PLATELETS: 122 10*3/uL — AB (ref 140–400)
RBC: 3.17 10*6/uL — AB (ref 4.20–5.82)
RDW: 22.7 % — ABNORMAL HIGH (ref 11.0–14.6)
WBC: 3.8 10*3/uL — ABNORMAL LOW (ref 4.0–10.3)
lymph#: 0.5 10*3/uL — ABNORMAL LOW (ref 0.9–3.3)
nRBC: 2 % — ABNORMAL HIGH (ref 0–0)

## 2014-03-14 LAB — PROTIME-INR
INR: 1.4 — ABNORMAL LOW (ref 2.00–3.50)
PROTIME: 16.8 s — AB (ref 10.6–13.4)

## 2014-03-14 LAB — POCT INR: INR: 1.4

## 2014-03-14 LAB — LACTATE DEHYDROGENASE (CC13): LDH: 342 U/L — ABNORMAL HIGH (ref 125–245)

## 2014-03-14 NOTE — Patient Instructions (Signed)
Resume at 5 mg daily except for 7.5 mg on Mondays.   Recheck PT/INR with your appt on 03/21/14.  We will see you during your infusion.

## 2014-03-14 NOTE — Progress Notes (Signed)
PT seen in clinic today INR=1.4 after holding due to supratherapuetic INR (>5) Resume at 5 mg daily except for 7.5 mg on Mondays.   Recheck PT/INR with your appt on 03/21/14.  We will see you during your infusion.  Pt PCP has d/c amlodipine and lyrica due to LE edema.  He has started on clonidine (verify dose at next CC visit) BP today in clinic was 133/90 We will see next week during infusion

## 2014-03-16 LAB — IGG, IGA, IGM
IGA: 22 mg/dL — AB (ref 68–379)
IgG (Immunoglobin G), Serum: 296 mg/dL — ABNORMAL LOW (ref 650–1600)
IgM, Serum: 9 mg/dL — ABNORMAL LOW (ref 41–251)

## 2014-03-16 LAB — BETA 2 MICROGLOBULIN, SERUM: BETA 2 MICROGLOBULIN: 3.33 mg/L — AB (ref <=2.51)

## 2014-03-16 LAB — KAPPA/LAMBDA LIGHT CHAINS
Kappa free light chain: 29.4 mg/dL — ABNORMAL HIGH (ref 0.33–1.94)
Kappa:Lambda Ratio: 38.18 — ABNORMAL HIGH (ref 0.26–1.65)
Lambda Free Lght Chn: 0.77 mg/dL (ref 0.57–2.63)

## 2014-03-21 ENCOUNTER — Ambulatory Visit: Payer: 59 | Admitting: Pharmacist

## 2014-03-21 ENCOUNTER — Other Ambulatory Visit: Payer: Self-pay | Admitting: *Deleted

## 2014-03-21 ENCOUNTER — Ambulatory Visit (HOSPITAL_BASED_OUTPATIENT_CLINIC_OR_DEPARTMENT_OTHER): Payer: 59 | Admitting: Physician Assistant

## 2014-03-21 ENCOUNTER — Encounter: Payer: Self-pay | Admitting: Physician Assistant

## 2014-03-21 ENCOUNTER — Ambulatory Visit (HOSPITAL_BASED_OUTPATIENT_CLINIC_OR_DEPARTMENT_OTHER): Payer: 59

## 2014-03-21 ENCOUNTER — Other Ambulatory Visit (HOSPITAL_BASED_OUTPATIENT_CLINIC_OR_DEPARTMENT_OTHER): Payer: 59

## 2014-03-21 ENCOUNTER — Telehealth: Payer: Self-pay | Admitting: Internal Medicine

## 2014-03-21 VITALS — BP 178/85 | HR 68 | Temp 98.5°F | Resp 19 | Ht 63.0 in | Wt 227.8 lb

## 2014-03-21 DIAGNOSIS — C9002 Multiple myeloma in relapse: Secondary | ICD-10-CM

## 2014-03-21 DIAGNOSIS — M949 Disorder of cartilage, unspecified: Secondary | ICD-10-CM

## 2014-03-21 DIAGNOSIS — I776 Arteritis, unspecified: Secondary | ICD-10-CM

## 2014-03-21 DIAGNOSIS — C9000 Multiple myeloma not having achieved remission: Secondary | ICD-10-CM

## 2014-03-21 DIAGNOSIS — R5381 Other malaise: Secondary | ICD-10-CM

## 2014-03-21 DIAGNOSIS — I82409 Acute embolism and thrombosis of unspecified deep veins of unspecified lower extremity: Secondary | ICD-10-CM

## 2014-03-21 DIAGNOSIS — R5383 Other fatigue: Secondary | ICD-10-CM

## 2014-03-21 DIAGNOSIS — M899 Disorder of bone, unspecified: Secondary | ICD-10-CM

## 2014-03-21 DIAGNOSIS — Z5112 Encounter for antineoplastic immunotherapy: Secondary | ICD-10-CM

## 2014-03-21 LAB — CBC WITH DIFFERENTIAL/PLATELET
BASO%: 3.8 % — AB (ref 0.0–2.0)
Basophils Absolute: 0.1 10*3/uL (ref 0.0–0.1)
EOS%: 3.5 % (ref 0.0–7.0)
Eosinophils Absolute: 0.1 10*3/uL (ref 0.0–0.5)
HCT: 33.1 % — ABNORMAL LOW (ref 38.4–49.9)
HEMOGLOBIN: 10.1 g/dL — AB (ref 13.0–17.1)
LYMPH#: 0.7 10*3/uL — AB (ref 0.9–3.3)
LYMPH%: 21.3 % (ref 14.0–49.0)
MCH: 28.6 pg (ref 27.2–33.4)
MCHC: 30.5 g/dL — AB (ref 32.0–36.0)
MCV: 93.8 fL (ref 79.3–98.0)
MONO#: 0.3 10*3/uL (ref 0.1–0.9)
MONO%: 10.2 % (ref 0.0–14.0)
NEUT#: 1.9 10*3/uL (ref 1.5–6.5)
NEUT%: 61.2 % (ref 39.0–75.0)
Platelets: 190 10*3/uL (ref 140–400)
RBC: 3.53 10*6/uL — AB (ref 4.20–5.82)
RDW: 19.1 % — AB (ref 11.0–14.6)
WBC: 3.1 10*3/uL — ABNORMAL LOW (ref 4.0–10.3)
nRBC: 1 % — ABNORMAL HIGH (ref 0–0)

## 2014-03-21 LAB — PROTIME-INR
INR: 2.3 (ref 2.00–3.50)
PROTIME: 27.6 s — AB (ref 10.6–13.4)

## 2014-03-21 LAB — COMPREHENSIVE METABOLIC PANEL (CC13)
ALT: 12 U/L (ref 0–55)
ANION GAP: 10 meq/L (ref 3–11)
AST: 15 U/L (ref 5–34)
Albumin: 3.3 g/dL — ABNORMAL LOW (ref 3.5–5.0)
Alkaline Phosphatase: 50 U/L (ref 40–150)
BUN: 7.5 mg/dL (ref 7.0–26.0)
CALCIUM: 9.5 mg/dL (ref 8.4–10.4)
CHLORIDE: 111 meq/L — AB (ref 98–109)
CO2: 24 meq/L (ref 22–29)
CREATININE: 0.8 mg/dL (ref 0.7–1.3)
GLUCOSE: 99 mg/dL (ref 70–140)
Potassium: 4.1 mEq/L (ref 3.5–5.1)
Sodium: 145 mEq/L (ref 136–145)
Total Bilirubin: 0.93 mg/dL (ref 0.20–1.20)
Total Protein: 6.3 g/dL — ABNORMAL LOW (ref 6.4–8.3)

## 2014-03-21 LAB — POCT INR: INR: 2.3

## 2014-03-21 MED ORDER — DEXAMETHASONE SODIUM PHOSPHATE 10 MG/ML IJ SOLN
10.0000 mg | Freq: Once | INTRAMUSCULAR | Status: AC
  Administered 2014-03-21: 10 mg via INTRAVENOUS

## 2014-03-21 MED ORDER — SODIUM CHLORIDE 0.9 % IV SOLN: Freq: Once | INTRAVENOUS | Status: DC

## 2014-03-21 MED ORDER — SODIUM CHLORIDE 0.9 % IJ SOLN
10.0000 mL | INTRAMUSCULAR | Status: DC | PRN
  Administered 2014-03-21: 10 mL
  Filled 2014-03-21: qty 10

## 2014-03-21 MED ORDER — SODIUM CHLORIDE 0.9 % IV SOLN
Freq: Once | INTRAVENOUS | Status: AC
  Administered 2014-03-21: 11:00:00 via INTRAVENOUS

## 2014-03-21 MED ORDER — POMALIDOMIDE 3 MG PO CAPS: 3.0000 mg | ORAL_CAPSULE | Freq: Every day | ORAL | Status: DC

## 2014-03-21 MED ORDER — ONDANSETRON 8 MG/NS 50 ML IVPB
INTRAVENOUS | Status: AC
  Filled 2014-03-21: qty 8

## 2014-03-21 MED ORDER — WARFARIN SODIUM 5 MG PO TABS: ORAL_TABLET | ORAL | Status: DC

## 2014-03-21 MED ORDER — FUROSEMIDE 20 MG PO TABS: ORAL_TABLET | ORAL | Status: AC

## 2014-03-21 MED ORDER — DEXTROSE 5 % IV SOLN
56.0000 mg/m2 | Freq: Once | INTRAVENOUS | Status: AC
  Administered 2014-03-21: 118 mg via INTRAVENOUS
  Filled 2014-03-21: qty 59

## 2014-03-21 MED ORDER — DEXAMETHASONE SODIUM PHOSPHATE 10 MG/ML IJ SOLN
INTRAMUSCULAR | Status: AC
  Filled 2014-03-21: qty 1

## 2014-03-21 MED ORDER — ONDANSETRON 8 MG/50ML IVPB (CHCC)
8.0000 mg | Freq: Once | INTRAVENOUS | Status: AC
  Administered 2014-03-21: 8 mg via INTRAVENOUS

## 2014-03-21 MED ORDER — HEPARIN SOD (PORK) LOCK FLUSH 100 UNIT/ML IV SOLN
500.0000 [IU] | Freq: Once | INTRAVENOUS | Status: AC | PRN
  Administered 2014-03-21: 500 [IU]
  Filled 2014-03-21: qty 5

## 2014-03-21 NOTE — Patient Instructions (Addendum)
INR at goal  No changes Continue at 5 mg daily except for 7.5 mg on Mondays.   Recheck PT/INR with your appt on 03/28/14 at 8am for lab and 8:30am for infusion.  We will see you during your infusion.

## 2014-03-21 NOTE — Telephone Encounter (Signed)
S/w karen from rad onc regarding the appt with dr Sondra Come. Pt is aware of the d/t of the appt.

## 2014-03-21 NOTE — Progress Notes (Signed)
[place] [phone]   [phone]  SHARED VISIT PROGRESS NOTE  [name]., MD [address]  [place] [address]  Principle Diagnosis:  #1 recurrent multiple myeloma IgG kappa subtype diagnosed in June of 2008  #2 history of vasculitis and thrombosis of skin lesions   Prior Therapy:  #1 status post palliative radiotherapy to the left hip under the care of Dr. Sondra Come  #2 status post 5 cycles of systemic chemotherapy with Revlimid and low dose Decadron. Last dose given June 2009 with good response.  #3 status post autologous peripheral blood stem cell transplant at [facility] on 02/02/2008.  #4 the patient had evidence for disease recurrence in December 2010.  #5 Revlimid 25 mg by mouth daily for 21 days every 4 weeks in addition to Decadron 40 mg orally on a weekly basis. The patient is status post 28 cycles, discontinued today secondary to disease progression.  #6 Systemic chemotherapy with Velcade 1.3 mg/M2 on days 1, 4, 8 and 11 in addition to Doxil 30 mg/M2 on day 4 and Decadron 40 mg by mouth on weekly basis every 3 weeks. Status post 3 cycles, last dose was given 05/04/2012 discontinued secondary to intolerance.  #7 Salvage therapy treatment with the Pomalyst 4 mg by mouth daily for 21 days every 28 days as well as Cytoxan 50 mg by mouth every other day and dexamethasone 40 mg by mouth once weekly. Therapy beginning 09/25/2012, status post 2 cycles discontinued secondary to disease progression and intolerance.  #8 Kyprolis (Carfilzomib) 27 mg/M2 on days 1, 2, 8, 9, 15 and 16 every 4 weeks. First dose on 02/08/2013. This would be concurrent with the dexamethasone 40 mg by mouth on a weekly basis. Status post 3 cycles with disease progression.  #9 Systemic chemotherapy with Carfilzomib 27 mg/M2 days 1,2 , 8, 9, 15 and 16, Cytoxan 300 mg/M2 and dexamethasone 40 mg by mouth weekly every 4 weeks. Status post 4  cycles. Last dose was given 08/09/2013 discontinued today secondary to disease progression.  Current therapy:  1) Systemic chemotherapy with Carfilzomib 56 mg/M2 days 1,2 , 8, 9, 15 and 16, Pomalyst 3 mg by mouth daily for 21 days every 4 weeks and dexamethasone 40 mg by mouth weekly every 4 weeks. First cycle started on 10/05/2013. The patient missed day 8 and 9 of the first cycle secondary to thrombocytopenia. Status post 6 cycles.  2) Zometa 4 mg IV given every 3 months.    INTERVAL HISTORY: [name] 53 y.o. male returns to the clinic today for a followup visit accompanied by his wife.  He tolerated the last cycle of his treatment with Carfilzomib, Pomalyst and dexamethasone fairly well except for increasing fatigue.  He is using a walker at home at times to help with ambulation. He continues to complain of right lower extremity pain particularly in the shin region. His bilateral lower extremity edema has significantly improved. This has been well controlled with Lasix 20 mg on alternating days with 40 mg. He states that his primary care physician recently started him on clonidine 0.2 mg. Per his wife he is to take this medication 3 times daily however Mr. Eber states that he is only been taking it once a day. This medication was just started last week. He requests a refill prescriptions for his Lasix tablets as well as his warfarin tablets. He recently had a repeat protein studies performed to reevaluate his disease  he presents to discuss results. He also presents to proceed with day 1 of cycle #7 of his chemotherapy with Carfilzomib 56 mg/M2 days 1,2 , 8, 9, 15 and 16, Pomalyst 3 mg by mouth daily for 21 days every 4 weeks and dexamethasone 40 mg by mouth weekly every 4 weeks. He denies any fever, chills, diarrhea or constipation. He has no chest pain, shortness of breath, cough or hemoptysis. The patient denied having any nausea or vomiting. He has no weight loss or night sweats. He denies  any numbness or tingling or weakness and lower extremities. He has full control of his bowel and bladder habits.   MEDICAL HISTORY: Past Medical History  Diagnosis Date  . Multiple myeloma 09/24/2011  . Hypertension   . History of radiation therapy 07/09/2007-07/30/07    3000 cGy to left pelvis and proximal femur    ALLERGIES:  is allergic to red dye and other.  MEDICATIONS:  Current Outpatient Prescriptions  Medication Sig Dispense Refill  . cloNIDine (CATAPRES) 0.1 MG tablet Take 0.1 mg by mouth 2 (two) times daily.      [name] dexamethasone (DECADRON) 4 MG tablet Take 10 tablets (40 mg total) by mouth once a week. Mondays with chemo  40 tablet  2  . furosemide (LASIX) 20 MG tablet Take 32m (1 tablet) daily, alternating with 412m(2 tablets)  every other day.  45 tablet  0  . HYDROcodone-acetaminophen (NORCO) 10-325 MG per tablet Take 1-2 tablets by mouth every 6 (six) hours as needed. For pain.  40 tablet  0  . lidocaine-prilocaine (EMLA) cream Apply 1 application topically as needed.  30 g  0  . morphine (MS CONTIN) 30 MG 12 hr tablet Take 1 tablet (30 mg total) by mouth every 12 (twelve) hours as needed for pain.  60 tablet  0  . Nebivolol HCl (BYSTOLIC) 20 MG TABS Take 30 mg by mouth daily.      . Marland Kitchenmeprazole (PRILOSEC) 40 MG capsule Take 40 mg by mouth daily as needed (for heartburn).       . pomalidomide (POMALYST) 3 MG capsule Take 1 capsule (3 mg total) by mouth daily. Take with water on days 1-21. Repeat every 28 days.   AUTH # [id number] 02/01/14  21 capsule  0  . potassium chloride SA (K-DUR,KLOR-CON) 20 MEQ tablet TAKE 1 TABLET BY MOUTH EVERY DAY  30 tablet  0  . PRESCRIPTION MEDICATION [facility] Dr. Mo[facility]arlie Server    . prochlorperazine (COMPAZINE) 10 MG tablet Take 10 mg by mouth every 6 (six) hours as needed for nausea.      . Marland Kitchenarfarin (COUMADIN) 5 MG tablet Take 1-1.5 tablets (5-7.5 mg total) by mouth daily. Take 5 mg by mouth daily except 7.5 mg on  Mondays  50 tablet  1  . benzonatate (TESSALON) 200 MG capsule Take 1 capsule (200 mg total) by mouth 3 (three) times daily as needed for cough.  20 capsule  0  . chlorpheniramine-HYDROcodone (TUSSIONEX) 10-8 MG/5ML LQCR Take 5 mLs by mouth every 12 (twelve) hours as needed for cough.  115 mL  0  . oxyCODONE-acetaminophen (PERCOCET/ROXICET) 5-325 MG per tablet Take 1 tablet by mouth every 6 (six) hours as needed for moderate pain.       No current facility-administered medications for this visit.   Facility-Administered Medications Ordered in Other Visits  Medication Dose Route Frequency Provider Last Rate Last Dose  . 0.9 %  sodium  chloride infusion   Intravenous Once [employer], PA-C      . sodium chloride 0.9 % injection 10 mL  10 mL Intracatheter PRN Curt Bears, MD   10 mL at 07/27/13 1617  . sodium chloride 0.9 % injection 10 mL  10 mL Intracatheter PRN Carlton Adam, PA-C        SURGICAL HISTORY:  Past Surgical History  Procedure Laterality Date  . Video bronchoscopy  12/11/2012    Procedure: VIDEO BRONCHOSCOPY;  Surgeon: Ivin Poot, MD;  Location: [facility];  Service: Thoracic;  Laterality: N/A;  . Video assisted thoracoscopy  12/11/2012    Procedure: VIDEO ASSISTED THORACOSCOPY;  Surgeon: Ivin Poot, MD;  Location: [facility];  Service: Thoracic;  Laterality: Right;  . Decortication  12/11/2012    Procedure: DECORTICATION;  Surgeon: Ivin Poot, MD;  Location: [facility];  Service: Thoracic;  Laterality: N/A;    REVIEW OF SYSTEMS:  Constitutional: negative Eyes: negative Ears, nose, mouth, throat, and face: negative Respiratory: negative Cardiovascular: positive for lower extremity edema Gastrointestinal: negative Genitourinary:negative Integument/breast: negative Hematologic/lymphatic: negative Musculoskeletal:positive for bone pain Neurological: negative Behavioral/Psych: negative Endocrine: negative Allergic/Immunologic: negative   PHYSICAL EXAMINATION:  General appearance: alert, cooperative and no distress Head: Normocephalic, without obvious abnormality, atraumatic, Nontender Neck: no adenopathy, no JVD, supple, symmetrical, trachea midline and thyroid not enlarged, symmetric, no tenderness/mass/nodules Lymph nodes: Cervical, supraclavicular, and axillary nodes normal. Resp: clear to auscultation bilaterally Back: symmetric, no curvature. ROM normal. No CVA tenderness. Cardio: regular rate and rhythm, S1, S2 normal, no murmur, click, rub or gallop GI: soft, non-tender; bowel sounds normal; no masses,  no organomegaly Extremities: edema trace pitting edema bilateral lower extremities Neurologic: Alert and oriented X 3, normal strength and tone. Normal symmetric reflexes. Normal coordination and gait  ECOG PERFORMANCE STATUS: 1 - Symptomatic but completely ambulatory  Blood pressure 178/85, pulse 68, temperature 98.5 F (36.9 C), temperature source Oral, resp. rate 19, height '5\' 3"'  (1.6 m), weight 227 lb 12.8 oz (103.329 kg).  LABORATORY DATA: Lab Results  Component Value Date   WBC 3.1* 03/21/2014   HGB 10.1* 03/21/2014   HCT 33.1* 03/21/2014   MCV 93.8 03/21/2014   PLT 190 03/21/2014      Chemistry      Component Value Date/Time   NA 145 03/21/2014 0807   NA 136 03/16/2013 1930   K 4.1 03/21/2014 0807   K 3.8 03/16/2013 1930   CL 112* 05/10/2013 0849   CL 110 03/16/2013 1930   CO2 24 03/21/2014 0807   CO2 16* 03/16/2013 1930   BUN 7.5 03/21/2014 0807   BUN 22 03/16/2013 1930   CREATININE 0.8 03/21/2014 0807   CREATININE 1.24 03/16/2013 1930      Component Value Date/Time   CALCIUM 9.5 03/21/2014 0807   CALCIUM 8.2* 03/16/2013 1930   ALKPHOS 50 03/21/2014 0807   ALKPHOS 31* 03/16/2013 1930   AST 15 03/21/2014 0807   AST 11 03/16/2013 1930   ALT 12 03/21/2014 0807   ALT 15 03/16/2013 1930   BILITOT 0.93 03/21/2014 0807   BILITOT 0.2* 03/16/2013 1930     Radiology: Dg Bone Survey Met  02/24/2014   CLINICAL DATA:  Multiple Myeloma, Staging  EXAM:  METASTATIC BONE SURVEY  COMPARISON:  DG BONE SURVEY MET dated 12/02/2011  FINDINGS: Lateral skull single-view: The scattered small lucencies within the skull stable.  Cervical fluid spine two-view: Visualized portions cervical spine demonstrate no new lytic foci.  Two view  thoracic spine: Stable chronic endplate deformity T2 without evidence of lytic of appearing foci. Degenerative disc disease changes at the T9-10 level.  Two view lumbar spine: There has been interval development of compression deformities involving L1 and L2. Age indeterminate. No lytic foci.  Left shoulder and humerus: There are areas concerning for very small scattered lytic foci within the humerus.  Right shoulder and humerus: Findings concerning for small scattered lytic foci within the humerus.  Right femur:  No lytic foci  Left femur:  No lytic foci  Pelvis: There has been slight increase in the lytic foci involving the left iliac wing and left portion of the sacrum. Degenerative changes also appreciated involving the right and left hips and the sacroiliac regions.  Frontal view of the chest demonstrates hyperinflation. A fracture with overriding nonunion appreciated involving posterior aspect of the sixth rib on the right. Age indeterminate. No focal regions of consolidation or focal infiltrates.  IMPRESSION: Slight interval progression of disease within the left portion of the pelvis and sacrum.  Findings concerning for progression of disease within the right and left humeral head regions.  The disease within the skull appears stable.  Compression deformities involving L1 and L2 which have occurred in the interim age indeterminate.  Age indeterminate rib fracture involving the sixth rib on the right.  The above described fractures may represent posttraumatic events with no areas of pathologic fracture is in the patient's history cannot be excluded.   Electronically Signed   By: Margaree Mackintosh M.D.   On: 02/24/2014 08:43   ASSESSMENT AND  PLAN: The patient is a very pleasant 53 years old African American male with recurrent multiple myeloma currently on treatment with Carfilzomib 56 mg/M2, Pomalyst 3 mg by mouth daily for 21 days every 3 weeks in addition to Decadron 40 mg on a weekly basis. He is status post 6 cycles. The patient is tolerating his treatment fairly well with no significant adverse effects except for the generalized fatigue and bone pain. The patient was discussed with and also seen by Dr. Julien Nordmann. His beta-2 microglobulin is lower at 3.33 down from 4.99 approximately 2 months ago, the Kappa free light chain is slightly lower at 29.40 previously 29.80, lambda free light chain is now in the normal range is 0.77 with kappa to lambda ratio of 38.18 previously 56.23, LDH is lower at 342 previously 415, IgG now 296, previously 481, IgA 22 previously 23, IgM 9 previously 19. These results were reviewed with the patient. The patient was advised to contact Dr. Clabe Seal office regarding possible palliative radiotherapy to his painful bony areas. His labs were reviewed and are in treatable range. He will continue on his chemotherapy with no dose reductions. He'll followup in 2 weeks for another symptom management visit. Patient was encouraged to take his blood pressure medication as prescribed.  He was advised to call immediately if he has any concerning symptoms in the interval.  He was advised to call immediately if she has any concerning symptoms in the interval. The patient voices understanding of current disease status and treatment options and is in agreement with the current care plan.  All questions were answered. The patient knows to call the clinic with any problems, questions or concerns. We can certainly see the patient much sooner if necessary.  Disclaimer: This note was dictated with voice recognition software. Similar sounding words can inadvertently be transcribed and may not be corrected upon review.   Carlton Adam, PA-C 03/21/2014  ADDENDUM: Hematology/Oncology Attending: I had a face to face encounter with the patient today. I recommended his care plan. This is a very pleasant 53 years old African American male with recurrent multiple myeloma currently on treatment with Carfilzomib, Pomalyst and dexamethasone status post 6 cycles. His recent myeloma panel showed continuous improvement in his disease. I discussed the lab result with the patient and his wife. I recommended for him to continue his current treatment with chemotherapy as scheduled. He was also advised to call Dr. Sondra Come to reschedule his appointment for consideration of palliative radiotherapy to the painful bony areas. The patient would come back for followup visit in 2 weeks for evaluation with day 15 of his treatment. He was advised to call immediately if he has any concerning symptoms in the interval. He will continue on Coumadin for the history of deep venous thrombosis.  Disclaimer: This note was dictated with voice recognition software. Similar sounding words can inadvertently be transcribed and may not be corrected upon review. Curt Bears, MD 03/21/2014

## 2014-03-21 NOTE — Patient Instructions (Signed)
Continue labs and chemotherapy as scheduled Take your blood pressure medication as prescribed Followup in 2 weeks

## 2014-03-21 NOTE — Progress Notes (Signed)
INR at goal  Pt seen in infusion area Pt is doing well with no complaints No unusual bleeding or bruising No missed or extra doses No diet or medication changes Plan: Continue at 5 mg daily except for 7.5 mg on Mondays.   Recheck PT/INR with your appt on 03/28/14 at 8am for lab and 8:30am for infusion.  We will see you during your infusion.

## 2014-03-22 ENCOUNTER — Ambulatory Visit (HOSPITAL_BASED_OUTPATIENT_CLINIC_OR_DEPARTMENT_OTHER): Payer: 59

## 2014-03-22 VITALS — BP 176/85 | HR 54 | Temp 97.9°F | Resp 20

## 2014-03-22 DIAGNOSIS — Z5112 Encounter for antineoplastic immunotherapy: Secondary | ICD-10-CM

## 2014-03-22 DIAGNOSIS — C9002 Multiple myeloma in relapse: Secondary | ICD-10-CM

## 2014-03-22 DIAGNOSIS — C9000 Multiple myeloma not having achieved remission: Secondary | ICD-10-CM

## 2014-03-22 MED ORDER — DEXAMETHASONE SODIUM PHOSPHATE 10 MG/ML IJ SOLN
10.0000 mg | Freq: Once | INTRAMUSCULAR | Status: AC
  Administered 2014-03-22: 10 mg via INTRAVENOUS

## 2014-03-22 MED ORDER — DEXAMETHASONE SODIUM PHOSPHATE 10 MG/ML IJ SOLN
INTRAMUSCULAR | Status: AC
  Filled 2014-03-22: qty 1

## 2014-03-22 MED ORDER — DEXTROSE 5 % IV SOLN
56.0000 mg/m2 | Freq: Once | INTRAVENOUS | Status: AC
  Administered 2014-03-22: 118 mg via INTRAVENOUS
  Filled 2014-03-22: qty 59

## 2014-03-22 MED ORDER — HEPARIN SOD (PORK) LOCK FLUSH 100 UNIT/ML IV SOLN
500.0000 [IU] | Freq: Once | INTRAVENOUS | Status: AC | PRN
  Administered 2014-03-22: 500 [IU]
  Filled 2014-03-22: qty 5

## 2014-03-22 MED ORDER — SODIUM CHLORIDE 0.9 % IV SOLN
Freq: Once | INTRAVENOUS | Status: AC
  Administered 2014-03-22: 08:00:00 via INTRAVENOUS

## 2014-03-22 MED ORDER — SODIUM CHLORIDE 0.9 % IJ SOLN
10.0000 mL | INTRAMUSCULAR | Status: DC | PRN
  Administered 2014-03-22: 10 mL
  Filled 2014-03-22: qty 10

## 2014-03-22 MED ORDER — ONDANSETRON 8 MG/NS 50 ML IVPB
INTRAVENOUS | Status: AC
  Filled 2014-03-22: qty 8

## 2014-03-22 MED ORDER — ONDANSETRON 8 MG/50ML IVPB (CHCC)
8.0000 mg | Freq: Once | INTRAVENOUS | Status: AC
  Administered 2014-03-22: 8 mg via INTRAVENOUS

## 2014-03-22 NOTE — Patient Instructions (Signed)
[facility] Discharge Instructions for Patients Receiving Chemotherapy  Today you received the following chemotherapy agents Kyprolis To help prevent nausea and vomiting after your treatment, we encourage you to take your nausea medication as prescribed.  If you develop nausea and vomiting that is not controlled by your nausea medication, call the clinic.   BELOW ARE SYMPTOMS THAT SHOULD BE REPORTED IMMEDIATELY:  *FEVER GREATER THAN 100.5 F  *CHILLS WITH OR WITHOUT FEVER  NAUSEA AND VOMITING THAT IS NOT CONTROLLED WITH YOUR NAUSEA MEDICATION  *UNUSUAL SHORTNESS OF BREATH  *UNUSUAL BRUISING OR BLEEDING  TENDERNESS IN MOUTH AND THROAT WITH OR WITHOUT PRESENCE OF ULCERS  *URINARY PROBLEMS  *BOWEL PROBLEMS  UNUSUAL RASH Items with * indicate a potential emergency and should be followed up as soon as possible.  Feel free to call the clinic you have any questions or concerns. The clinic phone number is [phone].    

## 2014-03-25 ENCOUNTER — Other Ambulatory Visit: Payer: Self-pay | Admitting: Physician Assistant

## 2014-03-25 NOTE — Progress Notes (Signed)
Histology and Location of Primary Cancer: recurrent multiple myeloma IgG kappa subtype diagnosed in June of 2008   Sites of Visceral and Bony Metastatic Disease: lytic lesions in the pelvis and sacrum as well as L1-L2 compression fractures.   Location(s) of Symptomatic Metastases: lytic lesions in the pelvis and sacrum as well as L1-L2 compression fractures.   Past/Anticipated chemotherapy by medical oncology, if any:1) Systemic chemotherapy with Carfilzomib 56 mg/M2 days 1,2 , 8, 9, 15 and 16, Pomalyst 3 mg by mouth daily for 21 days every 4 weeks and dexamethasone 40 mg by mouth weekly every 4 weeks. First cycle started on 10/05/2013. The patient missed day 8 and 9 of the first cycle secondary to thrombocytopenia. Status post 5 cycles.  2) Zometa 4 mg IV given every 3 months.   Pain on a scale of 0-10 is: 4 in bilateral lower legs - takes morphine 30 mg once a day.  If Spine Met(s), symptoms, if any, include:  Bowel/Bladder retention or incontinence (please describe): no  Numbness or weakness in extremities (please describe): weakness in bil lower legs, tingling in both hands Current Decadron regimen, if applicable: Takes 40 mg once a week with chemotherapy.  Ambulatory status? Walker? Wheelchair?: uses walker as needed.  Came in a wheelchair today.  States he has had trouble walking for a month.   SAFETY ISSUES:   Prior radiation? 07/09/2007-07/30/07 3000 cGy to left pelvis and proximal femur  Pacemaker/ICD? no  Possible current pregnancy? no  Is the patient on methotrexate? No  Current Complaints / other details: Patient is here with his wife.

## 2014-03-28 ENCOUNTER — Ambulatory Visit (HOSPITAL_BASED_OUTPATIENT_CLINIC_OR_DEPARTMENT_OTHER): Payer: 59 | Admitting: Pharmacist

## 2014-03-28 ENCOUNTER — Telehealth: Payer: Self-pay | Admitting: *Deleted

## 2014-03-28 ENCOUNTER — Encounter: Payer: Self-pay | Admitting: Internal Medicine

## 2014-03-28 ENCOUNTER — Ambulatory Visit (HOSPITAL_BASED_OUTPATIENT_CLINIC_OR_DEPARTMENT_OTHER): Payer: 59

## 2014-03-28 ENCOUNTER — Other Ambulatory Visit (HOSPITAL_BASED_OUTPATIENT_CLINIC_OR_DEPARTMENT_OTHER): Payer: 59

## 2014-03-28 VITALS — BP 164/72 | HR 76 | Temp 98.8°F | Resp 16

## 2014-03-28 DIAGNOSIS — I776 Arteritis, unspecified: Secondary | ICD-10-CM

## 2014-03-28 DIAGNOSIS — C9002 Multiple myeloma in relapse: Secondary | ICD-10-CM

## 2014-03-28 DIAGNOSIS — Z5112 Encounter for antineoplastic immunotherapy: Secondary | ICD-10-CM

## 2014-03-28 DIAGNOSIS — C9000 Multiple myeloma not having achieved remission: Secondary | ICD-10-CM

## 2014-03-28 DIAGNOSIS — I82409 Acute embolism and thrombosis of unspecified deep veins of unspecified lower extremity: Secondary | ICD-10-CM

## 2014-03-28 LAB — COMPREHENSIVE METABOLIC PANEL (CC13)
ALT: 8 U/L (ref 0–55)
ANION GAP: 11 meq/L (ref 3–11)
AST: 8 U/L (ref 5–34)
Albumin: 3 g/dL — ABNORMAL LOW (ref 3.5–5.0)
Alkaline Phosphatase: 55 U/L (ref 40–150)
BUN: 7.9 mg/dL (ref 7.0–26.0)
CALCIUM: 9 mg/dL (ref 8.4–10.4)
CHLORIDE: 107 meq/L (ref 98–109)
CO2: 23 mEq/L (ref 22–29)
Creatinine: 0.7 mg/dL (ref 0.7–1.3)
Glucose: 114 mg/dl (ref 70–140)
Potassium: 3.4 mEq/L — ABNORMAL LOW (ref 3.5–5.1)
SODIUM: 141 meq/L (ref 136–145)
TOTAL PROTEIN: 5.9 g/dL — AB (ref 6.4–8.3)
Total Bilirubin: 1.15 mg/dL (ref 0.20–1.20)

## 2014-03-28 LAB — CBC WITH DIFFERENTIAL/PLATELET
BASO%: 0.1 % (ref 0.0–2.0)
BASOS ABS: 0 10*3/uL (ref 0.0–0.1)
EOS%: 2.9 % (ref 0.0–7.0)
Eosinophils Absolute: 0.1 10*3/uL (ref 0.0–0.5)
HEMATOCRIT: 31.2 % — AB (ref 38.4–49.9)
HGB: 9.8 g/dL — ABNORMAL LOW (ref 13.0–17.1)
LYMPH#: 0.5 10*3/uL — AB (ref 0.9–3.3)
LYMPH%: 9.4 % — ABNORMAL LOW (ref 14.0–49.0)
MCH: 28.8 pg (ref 27.2–33.4)
MCHC: 31.5 g/dL — ABNORMAL LOW (ref 32.0–36.0)
MCV: 91.2 fL (ref 79.3–98.0)
MONO#: 1.2 10*3/uL — ABNORMAL HIGH (ref 0.1–0.9)
MONO%: 24.6 % — ABNORMAL HIGH (ref 0.0–14.0)
NEUT%: 63 % (ref 39.0–75.0)
NEUTROS ABS: 3.1 10*3/uL (ref 1.5–6.5)
Platelets: 70 10*3/uL — ABNORMAL LOW (ref 140–400)
RBC: 3.42 10*6/uL — ABNORMAL LOW (ref 4.20–5.82)
RDW: 21.5 % — ABNORMAL HIGH (ref 11.0–14.6)
WBC: 4.9 10*3/uL (ref 4.0–10.3)

## 2014-03-28 LAB — POCT INR: INR: 2.6

## 2014-03-28 LAB — PROTIME-INR
INR: 2.6 (ref 2.00–3.50)
Protime: 31.2 Seconds — ABNORMAL HIGH (ref 10.6–13.4)

## 2014-03-28 MED ORDER — ONDANSETRON 8 MG/NS 50 ML IVPB
INTRAVENOUS | Status: AC
  Filled 2014-03-28: qty 8

## 2014-03-28 MED ORDER — DEXAMETHASONE SODIUM PHOSPHATE 10 MG/ML IJ SOLN
10.0000 mg | Freq: Once | INTRAMUSCULAR | Status: AC
  Administered 2014-03-28: 10 mg via INTRAVENOUS

## 2014-03-28 MED ORDER — SODIUM CHLORIDE 0.9 % IJ SOLN
10.0000 mL | INTRAMUSCULAR | Status: DC | PRN
  Administered 2014-03-28: 10 mL
  Filled 2014-03-28: qty 10

## 2014-03-28 MED ORDER — HEPARIN SOD (PORK) LOCK FLUSH 100 UNIT/ML IV SOLN
500.0000 [IU] | Freq: Once | INTRAVENOUS | Status: AC | PRN
  Administered 2014-03-28: 500 [IU]
  Filled 2014-03-28: qty 5

## 2014-03-28 MED ORDER — DEXAMETHASONE SODIUM PHOSPHATE 10 MG/ML IJ SOLN
INTRAMUSCULAR | Status: AC
  Filled 2014-03-28: qty 1

## 2014-03-28 MED ORDER — ONDANSETRON 8 MG/50ML IVPB (CHCC)
8.0000 mg | Freq: Once | INTRAVENOUS | Status: AC
  Administered 2014-03-28: 8 mg via INTRAVENOUS

## 2014-03-28 MED ORDER — SODIUM CHLORIDE 0.9 % IV SOLN
Freq: Once | INTRAVENOUS | Status: AC
  Administered 2014-03-28: 09:00:00 via INTRAVENOUS

## 2014-03-28 MED ORDER — DEXTROSE 5 % IV SOLN
56.0000 mg/m2 | Freq: Once | INTRAVENOUS | Status: AC
  Administered 2014-03-28: 118 mg via INTRAVENOUS
  Filled 2014-03-28: qty 59

## 2014-03-28 NOTE — Progress Notes (Signed)
OK to treat with current CBC/platelets

## 2014-03-28 NOTE — Progress Notes (Signed)
Insurance is paying jcode [id number] kyprolis.

## 2014-03-28 NOTE — Progress Notes (Signed)
INR within goal today. No changes in diet or medications. No concerns or problems regarding anticoagulation. No s/s of clotting noted. No missed coumadin doses. Called [facility] on [address] to verify clonidine dose. Clonidine Rx on file is 0.2mg  TID. Continue 5mg  daily except for 7.5mg  on Mondays.   Recheck PT/INR with your appt on 04/04/14;  8am for lab, 8:30am for infusion, and 8:45am for coumadin clinic.

## 2014-03-28 NOTE — Patient Instructions (Signed)
Continue 5mg  daily except for 7.5mg  on Mondays.   Recheck PT/INR with your appt on 04/04/14;  8am for lab, 8:30am for infusion, and 8:45am for coumadin clinic.   We will see you during your infusion.

## 2014-03-28 NOTE — Patient Instructions (Signed)
[facility] Discharge Instructions for Patients Receiving Chemotherapy  Today you received the following chemotherapy agents Kyprolis  To help prevent nausea and vomiting after your treatment, we encourage you to take your nausea medication as needed   If you develop nausea and vomiting that is not controlled by your nausea medication, call the clinic.   BELOW ARE SYMPTOMS THAT SHOULD BE REPORTED IMMEDIATELY:  *FEVER GREATER THAN 100.5 F  *CHILLS WITH OR WITHOUT FEVER  NAUSEA AND VOMITING THAT IS NOT CONTROLLED WITH YOUR NAUSEA MEDICATION  *UNUSUAL SHORTNESS OF BREATH  *UNUSUAL BRUISING OR BLEEDING  TENDERNESS IN MOUTH AND THROAT WITH OR WITHOUT PRESENCE OF ULCERS  *URINARY PROBLEMS  *BOWEL PROBLEMS  UNUSUAL RASH Items with * indicate a potential emergency and should be followed up as soon as possible.  Feel free to call the clinic you have any questions or concerns. The clinic phone number is [phone].

## 2014-03-28 NOTE — Telephone Encounter (Signed)
i have adjsuted 5/18

## 2014-03-29 ENCOUNTER — Encounter: Payer: Self-pay | Admitting: Internal Medicine

## 2014-03-29 ENCOUNTER — Ambulatory Visit (HOSPITAL_BASED_OUTPATIENT_CLINIC_OR_DEPARTMENT_OTHER): Payer: 59

## 2014-03-29 VITALS — BP 158/96

## 2014-03-29 DIAGNOSIS — C9000 Multiple myeloma not having achieved remission: Secondary | ICD-10-CM

## 2014-03-29 DIAGNOSIS — Z5112 Encounter for antineoplastic immunotherapy: Secondary | ICD-10-CM

## 2014-03-29 DIAGNOSIS — C9002 Multiple myeloma in relapse: Secondary | ICD-10-CM

## 2014-03-29 MED ORDER — DEXAMETHASONE SODIUM PHOSPHATE 10 MG/ML IJ SOLN
INTRAMUSCULAR | Status: AC
  Filled 2014-03-29: qty 1

## 2014-03-29 MED ORDER — SODIUM CHLORIDE 0.9 % IV SOLN
Freq: Once | INTRAVENOUS | Status: AC
  Administered 2014-03-29: 11:00:00 via INTRAVENOUS

## 2014-03-29 MED ORDER — ONDANSETRON 8 MG/50ML IVPB (CHCC)
8.0000 mg | Freq: Once | INTRAVENOUS | Status: AC
  Administered 2014-03-29: 8 mg via INTRAVENOUS

## 2014-03-29 MED ORDER — HEPARIN SOD (PORK) LOCK FLUSH 100 UNIT/ML IV SOLN
500.0000 [IU] | Freq: Once | INTRAVENOUS | Status: AC | PRN
  Administered 2014-03-29: 500 [IU]
  Filled 2014-03-29: qty 5

## 2014-03-29 MED ORDER — DEXTROSE 5 % IV SOLN
56.0000 mg/m2 | Freq: Once | INTRAVENOUS | Status: AC
  Administered 2014-03-29: 118 mg via INTRAVENOUS
  Filled 2014-03-29: qty 59

## 2014-03-29 MED ORDER — SODIUM CHLORIDE 0.9 % IJ SOLN
10.0000 mL | INTRAMUSCULAR | Status: DC | PRN
  Administered 2014-03-29: 10 mL
  Filled 2014-03-29: qty 10

## 2014-03-29 MED ORDER — CLONIDINE HCL 0.1 MG PO TABS
ORAL_TABLET | ORAL | Status: AC
  Filled 2014-03-29: qty 2

## 2014-03-29 MED ORDER — CLONIDINE HCL 0.1 MG PO TABS
0.2000 mg | ORAL_TABLET | Freq: Once | ORAL | Status: AC
  Administered 2014-03-29: 0.2 mg via ORAL

## 2014-03-29 MED ORDER — ONDANSETRON 8 MG/NS 50 ML IVPB
INTRAVENOUS | Status: AC
  Filled 2014-03-29: qty 8

## 2014-03-29 MED ORDER — SODIUM CHLORIDE 0.9 % IV SOLN: Freq: Once | INTRAVENOUS | Status: DC

## 2014-03-29 MED ORDER — DEXAMETHASONE SODIUM PHOSPHATE 10 MG/ML IJ SOLN
10.0000 mg | Freq: Once | INTRAMUSCULAR | Status: AC
  Administered 2014-03-29: 10 mg via INTRAVENOUS

## 2014-03-29 NOTE — Progress Notes (Signed)
Put daughter's fmla form on nurse's desk. °

## 2014-03-29 NOTE — Progress Notes (Signed)
Elevated blood pressure today; last BP reading @ 11:17 = 158/99; Okay to treat per Dr. Earlie Server.  Patient has contacted PCP for follow up and latest BP readings faxed to his office today.  Patient in no distress.

## 2014-03-31 ENCOUNTER — Ambulatory Visit
Admission: RE | Admit: 2014-03-31 | Discharge: 2014-03-31 | Disposition: A | Discharge status: Home / Self Care | Payer: 59 | Source: Ambulatory Visit | Admitting: Radiation Oncology

## 2014-03-31 ENCOUNTER — Encounter: Payer: Self-pay | Admitting: Radiation Oncology

## 2014-03-31 ENCOUNTER — Ambulatory Visit
Admission: RE | Admit: 2014-03-31 | Discharge: 2014-03-31 | Disposition: A | Discharge status: Home / Self Care | Payer: 59 | Source: Ambulatory Visit | Attending: Radiation Oncology | Admitting: Radiation Oncology

## 2014-03-31 ENCOUNTER — Telehealth: Payer: Self-pay | Admitting: *Deleted

## 2014-03-31 ENCOUNTER — Encounter: Payer: Self-pay | Admitting: Internal Medicine

## 2014-03-31 ENCOUNTER — Ambulatory Visit (HOSPITAL_COMMUNITY)
Admission: RE | Admit: 2014-03-31 | Discharge: 2014-03-31 | Disposition: A | Discharge status: Home / Self Care | Payer: 59 | Source: Ambulatory Visit | Attending: Radiation Oncology | Admitting: Radiation Oncology

## 2014-03-31 VITALS — BP 177/67 | HR 52 | Temp 98.2°F | Resp 16 | Ht 63.0 in | Wt 236.3 lb

## 2014-03-31 DIAGNOSIS — C9000 Multiple myeloma not having achieved remission: Secondary | ICD-10-CM

## 2014-03-31 DIAGNOSIS — R609 Edema, unspecified: Secondary | ICD-10-CM | POA: Insufficient documentation

## 2014-03-31 DIAGNOSIS — M79609 Pain in unspecified limb: Secondary | ICD-10-CM | POA: Insufficient documentation

## 2014-03-31 NOTE — Progress Notes (Signed)
Please see the Nurse Progress Note in the MD Initial Consult Encounter for this patient. 

## 2014-03-31 NOTE — Telephone Encounter (Signed)
CALLED PATIENT TO INFORM OF TEST, LVM FOR A RETURN CALL 

## 2014-03-31 NOTE — Progress Notes (Signed)
Radiation Oncology         [phone] ________________________________  Name: [name] MRN: [id number]  Date: 03/31/2014  DOB: 1961-05-25  Reevaluation Note  CC: [name]., MD  Curt Bears, MD  Diagnosis:   Recurrent multiple myeloma IgG kappa subtype diagnosed in June of 2008     Primary site: Multiple Myeloma   Staging method: AJCC 6th Edition   Clinical: Stage IIIB    Summary: Stage IIIB   Interval Since Last Radiation:  6-1/2 years,  the patient completed radiation therapy to the left pelvis and proximal femur September 2008. He received 30 Gray in 15 fractions  Narrative:  The patient returns today for the evaluation at the courtesy of Dr. Julien Nordmann.   He is status post 5 cycles of systemic chemotherapy with Revlimid and low dose Decadron. Last dose given June 2009 with good response. Status post autologous peripheral blood stem cell transplant at [facility] on 02/02/2008. the patient had evidence for disease recurrence in December 2010. Most recently he has been treated with Carfilzomib 56 mg/M2 days 1,2 , 8, 9, 15 and 16, Pomalyst 3 mg by mouth daily for 21 days every 4 weeks and dexamethasone 40 mg by mouth weekly every 4 weeks.  Patient has been having problems with pain in his right shin. He also is having more difficulties with ambulation.  Patient underwent a bone survey  which showed the slight interval progression within the left portion of the pelvis and sacrum. Possibly some disease progression in the left and right humeral head regions.  He is now seen for consideration for palliative radiation therapy particularly  related to his right lower leg pain.  Patient is also noticed some weakness in his right hand as well as his right foot. He does notice some mild numbness in his right hand. Patient ambulates with a significant limp at this time. He does most of his ambulating with the assistance of a  Walker.  He has fallen recently  related to his  right leg pain and foot weakness- no apparent injury.  He also complains of some pain along the right lateral and right posterior chest region     ALLERGIES:  is allergic to red dye and other.  Meds: Current Outpatient Prescriptions  Medication Sig Dispense Refill  . benzonatate (TESSALON) 200 MG capsule Take 1 capsule (200 mg total) by mouth 3 (three) times daily as needed for cough.  20 capsule  0  . chlorpheniramine-HYDROcodone (TUSSIONEX) 10-8 MG/5ML LQCR Take 5 mLs by mouth every 12 (twelve) hours as needed for cough.  115 mL  0  . cloNIDine (CATAPRES) 0.2 MG tablet Take 0.2 mg by mouth 3 (three) times daily.      [name] dexamethasone (DECADRON) 4 MG tablet Take 10 tablets (40 mg total) by mouth once a week. Mondays with chemo  40 tablet  2  . furosemide (LASIX) 20 MG tablet Take 69m (1 tablet) daily, alternating with 463m(2 tablets)  every other day.  45 tablet  0  . HYDROcodone-acetaminophen (NORCO) 10-325 MG per tablet Take 1-2 tablets by mouth every 6 (six) hours as needed. For pain.  40 tablet  0  . lidocaine-prilocaine (EMLA) cream Apply 1 application topically as needed.  30 g  0  . morphine (MS CONTIN) 30 MG 12 hr tablet Take 1 tablet (30 mg total) by mouth every 12 (twelve) hours as needed for pain.  60 tablet  0  . Nebivolol HCl (BYSTOLIC) 20  MG TABS Take 30 mg by mouth daily.      [name] omeprazole (PRILOSEC) 40 MG capsule Take 40 mg by mouth daily as needed (for heartburn).       . pomalidomide (POMALYST) 3 MG capsule Take 1 capsule (3 mg total) by mouth daily. Take with water on days 1-21. Repeat every 28 days.   AUTH # [id number] obtained 03-20-2014, Adult Male  21 capsule  0  . potassium chloride SA (K-DUR,KLOR-CON) 20 MEQ tablet TAKE 1 TABLET BY MOUTH EVERY DAY  30 tablet  0  . PRESCRIPTION MEDICATION Receives chemo at [facility] weekly oncologist is Dr. Earlie Server      . prochlorperazine (COMPAZINE) 10 MG tablet Take 10 mg by mouth every 6 (six) hours as needed for nausea.      [name] warfarin  (COUMADIN) 5 MG tablet Take 5 mg by mouth daily except 7.5 mg on Mondays, or as directed  50 tablet  1  . oxyCODONE-acetaminophen (PERCOCET/ROXICET) 5-325 MG per tablet Take 1 tablet by mouth every 6 (six) hours as needed for moderate pain.       No current facility-administered medications for this encounter.   Facility-Administered Medications Ordered in Other Encounters  Medication Dose Route Frequency Provider Last Rate Last Dose  . 0.9 %  sodium chloride infusion   Intravenous Once Adrena E Johnson, PA-C      . sodium chloride 0.9 % injection 10 mL  10 mL Intracatheter PRN Curt Bears, MD   10 mL at 07/27/13 1617  . sodium chloride 0.9 % injection 10 mL  10 mL Intracatheter PRN Carlton Adam, PA-C        Physical Findings: The patient is in no acute distress. Patient is alert and oriented.  height is 5' 3" (1.6 m) and weight is 236 lb 4.8 oz (107.185 kg). His oral temperature is 98.2 F (36.8 C). His blood pressure is 177/67 and his pulse is 52. His respiration is 16 and oxygen saturation is 100%. .  The lungs are clear. The heart has a regular rhythm and rate. Grip strength is diminished in the right hand at 3/5 compared to left hand. Proximal motor strength appears adequate in the upper extremities. Patient has weakness with dorsiflexion of his right foot. He has mild tenderness with palpation along the right tibia.  Lab Findings: Lab Results  Component Value Date   WBC 4.9 03/28/2014   HGB 9.8* 03/28/2014   HCT 31.2* 03/28/2014   MCV 91.2 03/28/2014   PLT 70* 03/28/2014      Radiographic Findings: No results found.  Impression:  Recurrent multiple myeloma. Patient is having pain in his right shin area. He did undergo bone survey but this area was not imaged well. Patient will present to the radiology department later this morning for a right tibia/fibular x-ray. I am concerned about the patient's right foot and hand weakness, most likely related to nerve impingement. He will  be scheduled for MRI of the cervical thoracic and lumbar spine.  Plan:  Reevaluation after imaging studies complete.  ____________________________________ Blair Promise, MD

## 2014-03-31 NOTE — Progress Notes (Signed)
Faxed daughter's fmla form to [employer] @ [phone]

## 2014-04-01 ENCOUNTER — Other Ambulatory Visit: Payer: Self-pay

## 2014-04-01 ENCOUNTER — Encounter: Payer: Self-pay | Admitting: *Deleted

## 2014-04-01 NOTE — Progress Notes (Signed)
[place] Psychosocial Distress Screening Clinical Social Work  Clinical Social Work was referred by distress screening protocol.  The patient scored a 5 on the Psychosocial Distress Thermometer which indicates moderate distress. Clinical Social Worker aware the issues were physical in nature and MD was made aware of these concerns. No need for CSW to assess for distress and other psychosocial needs at this time.   ONCBCN DISTRESS SCREENING 03/31/2014  Screening Type Initial Screening  Elta Guadeloupe the number that describes how much distress you have been experiencing in the past week 5  Physical Problem type Pain;Tingling hands/feet;Sexual problems  Physician notified of physical symptoms Yes   Clinical Social Worker follow up needed: no  If yes, follow up plan:   Loren Racer, [place] Worker [facility] for [place] Wednesday, Thursday and Friday Phone: [phone] Fax: [phone]

## 2014-04-03 ENCOUNTER — Other Ambulatory Visit: Payer: Self-pay | Admitting: Internal Medicine

## 2014-04-04 ENCOUNTER — Telehealth: Payer: Self-pay | Admitting: Internal Medicine

## 2014-04-04 ENCOUNTER — Ambulatory Visit: Payer: 59

## 2014-04-04 ENCOUNTER — Encounter: Payer: Self-pay | Admitting: Physician Assistant

## 2014-04-04 ENCOUNTER — Other Ambulatory Visit (HOSPITAL_BASED_OUTPATIENT_CLINIC_OR_DEPARTMENT_OTHER): Payer: 59

## 2014-04-04 ENCOUNTER — Ambulatory Visit (HOSPITAL_BASED_OUTPATIENT_CLINIC_OR_DEPARTMENT_OTHER): Payer: 59 | Admitting: Physician Assistant

## 2014-04-04 ENCOUNTER — Ambulatory Visit (HOSPITAL_BASED_OUTPATIENT_CLINIC_OR_DEPARTMENT_OTHER): Payer: 59

## 2014-04-04 ENCOUNTER — Ambulatory Visit (HOSPITAL_BASED_OUTPATIENT_CLINIC_OR_DEPARTMENT_OTHER): Payer: Self-pay | Admitting: Pharmacist

## 2014-04-04 VITALS — BP 168/78 | HR 77 | Temp 99.0°F | Resp 18 | Ht 63.0 in | Wt 230.1 lb

## 2014-04-04 DIAGNOSIS — I776 Arteritis, unspecified: Secondary | ICD-10-CM

## 2014-04-04 DIAGNOSIS — I82409 Acute embolism and thrombosis of unspecified deep veins of unspecified lower extremity: Secondary | ICD-10-CM

## 2014-04-04 DIAGNOSIS — C9002 Multiple myeloma in relapse: Secondary | ICD-10-CM

## 2014-04-04 DIAGNOSIS — R5383 Other fatigue: Secondary | ICD-10-CM

## 2014-04-04 DIAGNOSIS — M949 Disorder of cartilage, unspecified: Secondary | ICD-10-CM

## 2014-04-04 DIAGNOSIS — C9000 Multiple myeloma not having achieved remission: Secondary | ICD-10-CM

## 2014-04-04 DIAGNOSIS — M899 Disorder of bone, unspecified: Secondary | ICD-10-CM

## 2014-04-04 DIAGNOSIS — G8918 Other acute postprocedural pain: Secondary | ICD-10-CM

## 2014-04-04 DIAGNOSIS — R5381 Other malaise: Secondary | ICD-10-CM

## 2014-04-04 DIAGNOSIS — G629 Polyneuropathy, unspecified: Secondary | ICD-10-CM

## 2014-04-04 DIAGNOSIS — Z5112 Encounter for antineoplastic immunotherapy: Secondary | ICD-10-CM

## 2014-04-04 LAB — CBC WITH DIFFERENTIAL/PLATELET
BASO%: 0.2 % (ref 0.0–2.0)
Basophils Absolute: 0 10*3/uL (ref 0.0–0.1)
EOS%: 2.8 % (ref 0.0–7.0)
Eosinophils Absolute: 0.2 10*3/uL (ref 0.0–0.5)
HEMATOCRIT: 31.1 % — AB (ref 38.4–49.9)
HGB: 9.6 g/dL — ABNORMAL LOW (ref 13.0–17.1)
LYMPH%: 11.7 % — AB (ref 14.0–49.0)
MCH: 28 pg (ref 27.2–33.4)
MCHC: 30.9 g/dL — AB (ref 32.0–36.0)
MCV: 90.7 fL (ref 79.3–98.0)
MONO#: 1 10*3/uL — AB (ref 0.1–0.9)
MONO%: 17.4 % — ABNORMAL HIGH (ref 0.0–14.0)
NEUT%: 67.9 % (ref 39.0–75.0)
NEUTROS ABS: 3.8 10*3/uL (ref 1.5–6.5)
NRBC: 1 % — AB (ref 0–0)
PLATELETS: 81 10*3/uL — AB (ref 140–400)
RBC: 3.43 10*6/uL — AB (ref 4.20–5.82)
RDW: 19.3 % — ABNORMAL HIGH (ref 11.0–14.6)
WBC: 5.6 10*3/uL (ref 4.0–10.3)
lymph#: 0.7 10*3/uL — ABNORMAL LOW (ref 0.9–3.3)

## 2014-04-04 LAB — COMPREHENSIVE METABOLIC PANEL (CC13)
ALBUMIN: 3 g/dL — AB (ref 3.5–5.0)
ALT: 12 U/L (ref 0–55)
ANION GAP: 12 meq/L — AB (ref 3–11)
AST: 6 U/L (ref 5–34)
Alkaline Phosphatase: 57 U/L (ref 40–150)
BUN: 9.5 mg/dL (ref 7.0–26.0)
CALCIUM: 9.2 mg/dL (ref 8.4–10.4)
CO2: 23 mEq/L (ref 22–29)
Chloride: 109 mEq/L (ref 98–109)
Creatinine: 0.7 mg/dL (ref 0.7–1.3)
Glucose: 87 mg/dl (ref 70–140)
POTASSIUM: 3.9 meq/L (ref 3.5–5.1)
Sodium: 144 mEq/L (ref 136–145)
TOTAL PROTEIN: 6.1 g/dL — AB (ref 6.4–8.3)
Total Bilirubin: 1.03 mg/dL (ref 0.20–1.20)

## 2014-04-04 LAB — PROTIME-INR
INR: 4.9 — ABNORMAL HIGH (ref 2.00–3.50)
PROTIME: 58.8 s — AB (ref 10.6–13.4)

## 2014-04-04 LAB — POCT INR: INR: 4.9

## 2014-04-04 MED ORDER — ONDANSETRON 8 MG/50ML IVPB (CHCC)
8.0000 mg | Freq: Once | INTRAVENOUS | Status: AC
  Administered 2014-04-04: 8 mg via INTRAVENOUS

## 2014-04-04 MED ORDER — DEXTROSE 5 % IV SOLN
56.0000 mg/m2 | Freq: Once | INTRAVENOUS | Status: AC
  Administered 2014-04-04: 118 mg via INTRAVENOUS
  Filled 2014-04-04: qty 59

## 2014-04-04 MED ORDER — HEPARIN SOD (PORK) LOCK FLUSH 100 UNIT/ML IV SOLN
500.0000 [IU] | Freq: Once | INTRAVENOUS | Status: AC | PRN
  Administered 2014-04-04: 500 [IU]
  Filled 2014-04-04: qty 5

## 2014-04-04 MED ORDER — DEXAMETHASONE SODIUM PHOSPHATE 10 MG/ML IJ SOLN
10.0000 mg | Freq: Once | INTRAMUSCULAR | Status: AC
  Administered 2014-04-04: 10 mg via INTRAVENOUS

## 2014-04-04 MED ORDER — DEXAMETHASONE SODIUM PHOSPHATE 10 MG/ML IJ SOLN
INTRAMUSCULAR | Status: AC
  Filled 2014-04-04: qty 1

## 2014-04-04 MED ORDER — SODIUM CHLORIDE 0.9 % IJ SOLN
10.0000 mL | INTRAMUSCULAR | Status: DC | PRN
  Administered 2014-04-04: 10 mL
  Filled 2014-04-04: qty 10

## 2014-04-04 MED ORDER — MORPHINE SULFATE ER 30 MG PO TBCR
30.0000 mg | EXTENDED_RELEASE_TABLET | Freq: Once | ORAL | Status: AC
  Administered 2014-04-04: 30 mg via ORAL
  Filled 2014-04-04: qty 1

## 2014-04-04 MED ORDER — SODIUM CHLORIDE 0.9 % IV SOLN
Freq: Once | INTRAVENOUS | Status: AC
  Administered 2014-04-04: 10:00:00 via INTRAVENOUS

## 2014-04-04 MED ORDER — HYDROCODONE-ACETAMINOPHEN 10-325 MG PO TABS: 1.0000 | ORAL_TABLET | Freq: Four times a day (QID) | ORAL | Status: DC | PRN

## 2014-04-04 MED ORDER — ONDANSETRON 8 MG/NS 50 ML IVPB
INTRAVENOUS | Status: AC
  Filled 2014-04-04: qty 8

## 2014-04-04 NOTE — Patient Instructions (Signed)
[facility] Discharge Instructions for Patients Receiving Chemotherapy  Today you received the following chemotherapy agents: Kyprolis  To help prevent nausea and vomiting after your treatment, we encourage you to take your nausea medication: Compazine 10 mg every 6 hrs as needed.    If you develop nausea and vomiting that is not controlled by your nausea medication, call the clinic.   BELOW ARE SYMPTOMS THAT SHOULD BE REPORTED IMMEDIATELY:  *FEVER GREATER THAN 100.5 F  *CHILLS WITH OR WITHOUT FEVER  NAUSEA AND VOMITING THAT IS NOT CONTROLLED WITH YOUR NAUSEA MEDICATION  *UNUSUAL SHORTNESS OF BREATH  *UNUSUAL BRUISING OR BLEEDING  TENDERNESS IN MOUTH AND THROAT WITH OR WITHOUT PRESENCE OF ULCERS  *URINARY PROBLEMS  *BOWEL PROBLEMS  UNUSUAL RASH Items with * indicate a potential emergency and should be followed up as soon as possible.  Feel free to call the clinic you have any questions or concerns. The clinic phone number is [phone].    

## 2014-04-04 NOTE — Progress Notes (Signed)
[place] Telephone:([phone]) [phone]   [phone]  OFFICE PROGRESS NOTE  [name]., MD [address] [place] [address]  Principle Diagnosis:  #1 recurrent multiple myeloma IgG kappa subtype diagnosed in June of 2008  #2 history of vasculitis and thrombosis of skin lesions   Prior Therapy:  #1 status post palliative radiotherapy to the left hip under the care of Dr. Sondra Come  #2 status post 5 cycles of systemic chemotherapy with Revlimid and low dose Decadron. Last dose given June 2009 with good response.  #3 status post autologous peripheral blood stem cell transplant at [facility] on 02/02/2008.  #4 the patient had evidence for disease recurrence in December 2010.  #5 Revlimid 25 mg by mouth daily for 21 days every 4 weeks in addition to Decadron 40 mg orally on a weekly basis. The patient is status post 28 cycles, discontinued today secondary to disease progression.  #6 Systemic chemotherapy with Velcade 1.3 mg/M2 on days 1, 4, 8 and 11 in addition to Doxil 30 mg/M2 on day 4 and Decadron 40 mg by mouth on weekly basis every 3 weeks. Status post 3 cycles, last dose was given 05/04/2012 discontinued secondary to intolerance.  #7 Salvage therapy treatment with the Pomalyst 4 mg by mouth daily for 21 days every 28 days as well as Cytoxan 50 mg by mouth every other day and dexamethasone 40 mg by mouth once weekly. Therapy beginning 09/25/2012, status post 2 cycles discontinued secondary to disease progression and intolerance.  #8 Kyprolis (Carfilzomib) 27 mg/M2 on days 1, 2, 8, 9, 15 and 16 every 4 weeks. First dose on 02/08/2013. This would be concurrent with the dexamethasone 40 mg by mouth on a weekly basis. Status post 3 cycles with disease progression.  #9 Systemic chemotherapy with Carfilzomib 27 mg/M2 days 1,2 , 8, 9, 15 and 16, Cytoxan 300 mg/M2 and dexamethasone 40 mg by mouth weekly every 4 weeks. Status post 4 cycles.  Last dose was given 08/09/2013 discontinued today secondary to disease progression.  Current therapy:  1) Systemic chemotherapy with Carfilzomib 56 mg/M2 days 1,2 , 8, 9, 15 and 16, Pomalyst 3 mg by mouth daily for 21 days every 4 weeks and dexamethasone 40 mg by mouth weekly every 4 weeks. First cycle started on 10/05/2013. The patient missed day 8 and 9 of the first cycle secondary to thrombocytopenia. Status post 6 cycles, as well as days #1, 2, 8 and 9 of cycle #7.  2) Zometa 4 mg IV given every 3 months.    INTERVAL HISTORY: JC VERON 53 y.o. male returns to the clinic today for a followup visit accompanied by his wife.  He tolerated the last cycle of his treatment with Carfilzomib, Pomalyst and dexamethasone fairly well except for increasing fatigue.  He is using a walker at home at times to help with ambulation. He continues to complain of right lower extremity pain particularly in the shin region. He also complains of left arm pain, which is new. His bilateral lower extremity edema has returned. This has been well controlled with Lasix 20 mg on alternating days with 40 mg. Patient discontinued taking the Catapres as it was not working for his blood pressure and resumed his amlodipine which has caused lower extremity edema in the past. Additionally he was taking Lyrica which also caused him lower extremity edema. He requests a refill for his hydrocodone tablets. He is to make an appointment with  Dr. Sondra Come regarding palliative radiotherapy to this right lower extremity and will have the left upper extremity evaluated as well.  He also presents to proceed with day 15 of cycle #7 of his chemotherapy with Carfilzomib 56 mg/M2 days 1,2 , 8, 9, 15 and 16, Pomalyst 3 mg by mouth daily for 21 days every 4 weeks and dexamethasone 40 mg by mouth weekly every 4 weeks. He denies any fever, chills, diarrhea or constipation. He has no chest pain, shortness of breath, cough or hemoptysis. The patient denied  having any nausea or vomiting. He has no weight loss or night sweats. He denies any numbness or tingling or weakness and lower extremities. He has full control of his bowel and bladder habits. The patient's wife reports that he is actually more debilitated at home that he is sharing with [place] that his appointments. He has had periods of tearfulness and crying related to his disease. He is not ambulating as easily. He uses his walker more in home. He is having more difficulty getting in and out of a shower and she states that she has to help him more with these types of activities of daily living.  MEDICAL HISTORY: Past Medical History  Diagnosis Date  . Multiple myeloma 09/24/2011  . Hypertension   . History of radiation therapy 07/09/2007-07/30/07    3000 cGy to left pelvis and proximal femur    ALLERGIES:  is allergic to red dye and other.  MEDICATIONS:  Current Outpatient Prescriptions  Medication Sig Dispense Refill  . benzonatate (TESSALON) 200 MG capsule Take 1 capsule (200 mg total) by mouth 3 (three) times daily as needed for cough.  20 capsule  0  . chlorpheniramine-HYDROcodone (TUSSIONEX) 10-8 MG/5ML LQCR Take 5 mLs by mouth every 12 (twelve) hours as needed for cough.  115 mL  0  . cloNIDine (CATAPRES) 0.2 MG tablet Take 0.2 mg by mouth 3 (three) times daily.      [name] dexamethasone (DECADRON) 4 MG tablet Take 10 tablets (40 mg total) by mouth once a week. Mondays with chemo  40 tablet  2  . furosemide (LASIX) 20 MG tablet Take 51m (1 tablet) daily, alternating with 448m(2 tablets)  every other day.  45 tablet  0  . HYDROcodone-acetaminophen (NORCO) 10-325 MG per tablet Take 1-2 tablets by mouth every 6 (six) hours as needed. For pain.  40 tablet  0  . lidocaine-prilocaine (EMLA) cream Apply 1 application topically as needed.  30 g  0  . morphine (MS CONTIN) 30 MG 12 hr tablet Take 1 tablet (30 mg total) by mouth every 12 (twelve) hours as needed for pain.  60 tablet  0  . Nebivolol HCl  (BYSTOLIC) 20 MG TABS Take 30 mg by mouth daily.      . Marland Kitchenmeprazole (PRILOSEC) 40 MG capsule Take 40 mg by mouth daily as needed (for heartburn).       . potassium chloride SA (K-DUR,KLOR-CON) 20 MEQ tablet TAKE 1 TABLET BY MOUTH EVERY DAY  30 tablet  0  . PRESCRIPTION MEDICATION [facility] MoEarlie Server    . prochlorperazine (COMPAZINE) 10 MG tablet Take 10 mg by mouth every 6 (six) hours as needed for nausea.      . Marland Kitchenarfarin (COUMADIN) 5 MG tablet Take 5 mg by mouth daily except 7.5 mg on Mondays, or as directed  50 tablet  1  . oxyCODONE-acetaminophen (PERCOCET/ROXICET) 5-325 MG per tablet Take 1 tablet by  mouth every 6 (six) hours as needed for moderate pain.      . pomalidomide (POMALYST) 3 MG capsule Take 1 capsule (3 mg total) by mouth daily. Take with water on days 1-21. Repeat every 28 days.   AUTH # [id number] obtained 03-20-2014, Adult Male  21 capsule  0   No current facility-administered medications for this visit.   Facility-Administered Medications Ordered in Other Visits  Medication Dose Route Frequency Provider Last Rate Last Dose  . 0.9 %  sodium chloride infusion   Intravenous Once Adrena E Johnson, PA-C      . carfilzomib (KYPROLIS) 118 mg in dextrose 5 % 100 mL chemo infusion  56 mg/m2 (Treatment Plan Actual) Intravenous Once Curt Bears, MD 318 mL/hr at 04/04/14 1024 118 mg at 04/04/14 1024  . heparin lock flush 100 unit/mL  500 Units Intracatheter Once PRN Curt Bears, MD      . sodium chloride 0.9 % injection 10 mL  10 mL Intracatheter PRN Curt Bears, MD   10 mL at 07/27/13 1617  . sodium chloride 0.9 % injection 10 mL  10 mL Intracatheter PRN Adrena E Johnson, PA-C      . sodium chloride 0.9 % injection 10 mL  10 mL Intracatheter PRN Curt Bears, MD        SURGICAL HISTORY:  Past Surgical History  Procedure Laterality Date  . Video bronchoscopy  12/11/2012    Procedure: VIDEO BRONCHOSCOPY;  Surgeon: Ivin Poot, MD;   Location: [place];  Service: Thoracic;  Laterality: N/A;  . Video assisted thoracoscopy  12/11/2012    Procedure: VIDEO ASSISTED THORACOSCOPY;  Surgeon: Ivin Poot, MD;  Location: [place];  Service: Thoracic;  Laterality: Right;  . Decortication  12/11/2012    Procedure: DECORTICATION;  Surgeon: Ivin Poot, MD;  Location: [facility] OR;  Service: Thoracic;  Laterality: N/A;    REVIEW OF SYSTEMS:  Constitutional: negative Eyes: negative Ears, nose, mouth, throat, and face: negative Respiratory: negative Cardiovascular: positive for lower extremity edema Gastrointestinal: negative Genitourinary:negative Integument/breast: negative Hematologic/lymphatic: negative Musculoskeletal:positive for bone pain Neurological: negative Behavioral/Psych: negative Endocrine: negative Allergic/Immunologic: negative   PHYSICAL EXAMINATION: General appearance: alert, cooperative and no distress Head: Normocephalic, without obvious abnormality, atraumatic, Nontender Neck: no adenopathy, no JVD, supple, symmetrical, trachea midline and thyroid not enlarged, symmetric, no tenderness/mass/nodules Lymph nodes: Cervical, supraclavicular, and axillary nodes normal. Resp: clear to auscultation bilaterally Back: symmetric, no curvature. ROM normal. No CVA tenderness. Cardio: regular rate and rhythm, S1, S2 normal, no murmur, click, rub or gallop GI: soft, non-tender; bowel sounds normal; no masses,  no organomegaly Extremities: edema 2+ pitting edema bilateral lower extremities Neurologic: Alert and oriented X 3, normal strength and tone. Normal symmetric reflexes. Normal coordination and gait  ECOG PERFORMANCE STATUS: 1 - Symptomatic but completely ambulatory  Blood pressure 168/78, pulse 77, temperature 99 F (37.2 C), temperature source Oral, resp. rate 18, height 5' 3" (1.6 m), weight 230 lb 1.6 oz (104.373 kg).  LABORATORY DATA: Lab Results  Component Value Date   WBC 5.6 04/04/2014   HGB 9.6* 04/04/2014    HCT 31.1* 04/04/2014   MCV 90.7 04/04/2014   PLT 81* 04/04/2014      Chemistry      Component Value Date/Time   NA 144 04/04/2014 0750   NA 136 03/16/2013 1930   K 3.9 04/04/2014 0750   K 3.8 03/16/2013 1930   CL 112* 05/10/2013 0849   CL 110 03/16/2013 1930   CO2 23  04/04/2014 0750   CO2 16* 03/16/2013 1930   BUN 9.5 04/04/2014 0750   BUN 22 03/16/2013 1930   CREATININE 0.7 04/04/2014 0750   CREATININE 1.24 03/16/2013 1930      Component Value Date/Time   CALCIUM 9.2 04/04/2014 0750   CALCIUM 8.2* 03/16/2013 1930   ALKPHOS 57 04/04/2014 0750   ALKPHOS 31* 03/16/2013 1930   AST 6 04/04/2014 0750   AST 11 03/16/2013 1930   ALT 12 04/04/2014 0750   ALT 15 03/16/2013 1930   BILITOT 1.03 04/04/2014 0750   BILITOT 0.2* 03/16/2013 1930     Radiology: Dg Bone Survey Met  02/24/2014   CLINICAL DATA:  Multiple Myeloma, Staging  EXAM: METASTATIC BONE SURVEY  COMPARISON:  DG BONE SURVEY MET dated 12/02/2011  FINDINGS: Lateral skull single-view: The scattered small lucencies within the skull stable.  Cervical fluid spine two-view: Visualized portions cervical spine demonstrate no new lytic foci.  Two view thoracic spine: Stable chronic endplate deformity T2 without evidence of lytic of appearing foci. Degenerative disc disease changes at the T9-10 level.  Two view lumbar spine: There has been interval development of compression deformities involving L1 and L2. Age indeterminate. No lytic foci.  Left shoulder and humerus: There are areas concerning for very small scattered lytic foci within the humerus.  Right shoulder and humerus: Findings concerning for small scattered lytic foci within the humerus.  Right femur:  No lytic foci  Left femur:  No lytic foci  Pelvis: There has been slight increase in the lytic foci involving the left iliac wing and left portion of the sacrum. Degenerative changes also appreciated involving the right and left hips and the sacroiliac regions.  Frontal view of the chest demonstrates  hyperinflation. A fracture with overriding nonunion appreciated involving posterior aspect of the sixth rib on the right. Age indeterminate. No focal regions of consolidation or focal infiltrates.  IMPRESSION: Slight interval progression of disease within the left portion of the pelvis and sacrum.  Findings concerning for progression of disease within the right and left humeral head regions.  The disease within the skull appears stable.  Compression deformities involving L1 and L2 which have occurred in the interim age indeterminate.  Age indeterminate rib fracture involving the sixth rib on the right.  The above described fractures may represent posttraumatic events with no areas of pathologic fracture is in the patient's history cannot be excluded.   Electronically Signed   By: Margaree Mackintosh M.D.   On: 02/24/2014 08:43   ASSESSMENT AND PLAN: The patient is a very pleasant 53 years old African American male with recurrent multiple myeloma currently on treatment with Carfilzomib 56 mg/M2, Pomalyst 3 mg by mouth daily for 21 days every 3 weeks in addition to Decadron 40 mg on a weekly basis. He is status post 6 cycles, as well as days 1, 2, 8 and 9 of cycle #7. The patient is tolerating his treatment fairly well with no significant adverse effects except for the generalized fatigue and bone pain. His most recent protein studies revealed that his beta-2 microglobulin is lower at 3.33 down from 4.99 approximately 2 months ago, the Kappa free light chain is slightly lower at 29.40 previously 29.80, lambda free light chain is now in the normal range is 0.77 with kappa to lambda ratio of 38.18 previously 56.23, LDH is lower at 342 previously 415, IgG now 296, previously 481, IgA 22 previously 23, IgM 9 previously 19.  The patient was again advised to  contact Dr. Clabe Seal office regarding possible palliative radiotherapy to his painful bony areas. His labs were reviewed and are in treatable range. He will continue on  his chemotherapy with no dose reductions. He'll followup in 2 weeks for another symptom management visit. Patient was encouraged to followup with his primary care physician regarding his blood pressure medications. We will continue to monitor the patient's functional status subsequent visits. Patient was given a refill prescription for his hydrocodone tablets. He he requested to be placed back on gabapentin instead of the Lyrica because of the edema associated with the Lyrica. A prescription for the gabapentin was sent to his pharmacy of record via E. scribed.  He was advised to call immediately if he has any concerning symptoms in the interval.  He was advised to call immediately if she has any concerning symptoms in the interval. The patient voices understanding of current disease status and treatment options and is in agreement with the current care plan.  All questions were answered. The patient knows to call the clinic with any problems, questions or concerns. We can certainly see the patient much sooner if necessary.  Disclaimer: This note was dictated with voice recognition software. Similar sounding words can inadvertently be transcribed and may not be corrected upon review.   Carlton Adam, PA-C 04/04/2014  Disclaimer: This note was dictated with voice recognition software. Similar sounding words can inadvertently be transcribed and may not be corrected upon review.

## 2014-04-04 NOTE — Progress Notes (Signed)
Pt seen during infusion today INR=4.9 on 5mg  daily with 7.5 mg on Mon Will hold today's dose, then continue 5mg  daily Recheck INR next Mon with current lab appmt at 8:15 and CC at 8:30 He made changes to his BP meds, stopped new and went back on old He was not aware of med names.

## 2014-04-04 NOTE — Patient Instructions (Signed)
Hold today then Continue 5mg  daily except for 7.5mg  on Mondays.   Recheck PT/INR with your appt on 04/12/14;  8:15am for lab, 8:30am for coumadin clinic.

## 2014-04-04 NOTE — Telephone Encounter (Signed)
gv adn printed appt sched and avs for pt for May and June....sed added tx. °

## 2014-04-04 NOTE — Progress Notes (Signed)
Per Adrena PA, okay to tx with platelets 81.

## 2014-04-05 ENCOUNTER — Ambulatory Visit (HOSPITAL_BASED_OUTPATIENT_CLINIC_OR_DEPARTMENT_OTHER): Payer: 59

## 2014-04-05 VITALS — BP 166/76 | HR 61 | Temp 97.8°F

## 2014-04-05 DIAGNOSIS — Z5112 Encounter for antineoplastic immunotherapy: Secondary | ICD-10-CM

## 2014-04-05 DIAGNOSIS — C9000 Multiple myeloma not having achieved remission: Secondary | ICD-10-CM

## 2014-04-05 DIAGNOSIS — C9002 Multiple myeloma in relapse: Secondary | ICD-10-CM

## 2014-04-05 MED ORDER — HEPARIN SOD (PORK) LOCK FLUSH 100 UNIT/ML IV SOLN
500.0000 [IU] | Freq: Once | INTRAVENOUS | Status: AC | PRN
  Administered 2014-04-05: 500 [IU]
  Filled 2014-04-05: qty 5

## 2014-04-05 MED ORDER — SODIUM CHLORIDE 0.9 % IJ SOLN
10.0000 mL | INTRAMUSCULAR | Status: DC | PRN
  Administered 2014-04-05: 10 mL
  Filled 2014-04-05: qty 10

## 2014-04-05 MED ORDER — DEXAMETHASONE SODIUM PHOSPHATE 10 MG/ML IJ SOLN
10.0000 mg | Freq: Once | INTRAMUSCULAR | Status: AC
  Administered 2014-04-05: 10 mg via INTRAVENOUS

## 2014-04-05 MED ORDER — SODIUM CHLORIDE 0.9 % IV SOLN
Freq: Once | INTRAVENOUS | Status: AC
  Administered 2014-04-05: 09:00:00 via INTRAVENOUS

## 2014-04-05 MED ORDER — DEXTROSE 5 % IV SOLN
56.0000 mg/m2 | Freq: Once | INTRAVENOUS | Status: AC
  Administered 2014-04-05: 118 mg via INTRAVENOUS
  Filled 2014-04-05: qty 59

## 2014-04-05 MED ORDER — ONDANSETRON 8 MG/50ML IVPB (CHCC)
8.0000 mg | Freq: Once | INTRAVENOUS | Status: AC
  Administered 2014-04-05: 8 mg via INTRAVENOUS

## 2014-04-05 MED ORDER — GABAPENTIN 300 MG PO CAPS: 300.0000 mg | ORAL_CAPSULE | Freq: Three times a day (TID) | ORAL | Status: DC

## 2014-04-05 MED ORDER — DEXAMETHASONE SODIUM PHOSPHATE 10 MG/ML IJ SOLN
INTRAMUSCULAR | Status: AC
  Filled 2014-04-05: qty 1

## 2014-04-05 MED ORDER — ONDANSETRON 8 MG/NS 50 ML IVPB
INTRAVENOUS | Status: AC
  Filled 2014-04-05: qty 8

## 2014-04-05 NOTE — Patient Instructions (Signed)
[facility] Discharge Instructions for Patients Receiving Chemotherapy  Today you received the following chemotherapy agent: carfilzomib (Kyprolis)  To help prevent nausea and vomiting after your treatment, we encourage you to take your nausea medications as directed:   Compazine 10 mg every 6 hours as needed   If you develop nausea and vomiting that is not controlled by your nausea medication, call the clinic.   BELOW ARE SYMPTOMS THAT SHOULD BE REPORTED IMMEDIATELY:  *FEVER GREATER THAN 100.5 F  *CHILLS WITH OR WITHOUT FEVER  NAUSEA AND VOMITING THAT IS NOT CONTROLLED WITH YOUR NAUSEA MEDICATION  *UNUSUAL SHORTNESS OF BREATH  *UNUSUAL BRUISING OR BLEEDING  TENDERNESS IN MOUTH AND THROAT WITH OR WITHOUT PRESENCE OF ULCERS  *URINARY PROBLEMS  *BOWEL PROBLEMS  UNUSUAL RASH Items with * indicate a potential emergency and should be followed up as soon as possible.  Feel free to call the clinic should you have any questions or concerns. The clinic phone number is [phone].  It has been a pleasure to serve you today!!

## 2014-04-05 NOTE — Progress Notes (Signed)
Presents to [place] in [place]-has history of falling at [facility]. Is a Fall Risk-yellow armband applied and Fall Precautions initiated.

## 2014-04-08 NOTE — Patient Instructions (Signed)
Contact Dr. Clabe Seal office regarding radiation therapy Followup with your primary care physician regarding her blood pressure medications Continue with labs and chemotherapy as scheduled Followup in 2 weeks

## 2014-04-12 ENCOUNTER — Ambulatory Visit (HOSPITAL_COMMUNITY)
Admission: RE | Admit: 2014-04-12 | Discharge: 2014-04-12 | Disposition: A | Discharge status: Home / Self Care | Payer: 59 | Source: Ambulatory Visit | Attending: Radiation Oncology | Admitting: Radiation Oncology

## 2014-04-12 ENCOUNTER — Other Ambulatory Visit: Payer: Self-pay | Admitting: Radiation Oncology

## 2014-04-12 ENCOUNTER — Ambulatory Visit (HOSPITAL_BASED_OUTPATIENT_CLINIC_OR_DEPARTMENT_OTHER): Payer: 59 | Admitting: Pharmacist

## 2014-04-12 ENCOUNTER — Other Ambulatory Visit (HOSPITAL_BASED_OUTPATIENT_CLINIC_OR_DEPARTMENT_OTHER): Payer: 59

## 2014-04-12 ENCOUNTER — Ambulatory Visit (HOSPITAL_COMMUNITY): Payer: 59

## 2014-04-12 DIAGNOSIS — C9002 Multiple myeloma in relapse: Secondary | ICD-10-CM

## 2014-04-12 DIAGNOSIS — C9000 Multiple myeloma not having achieved remission: Secondary | ICD-10-CM

## 2014-04-12 DIAGNOSIS — R29898 Other symptoms and signs involving the musculoskeletal system: Secondary | ICD-10-CM | POA: Insufficient documentation

## 2014-04-12 DIAGNOSIS — I82409 Acute embolism and thrombosis of unspecified deep veins of unspecified lower extremity: Secondary | ICD-10-CM

## 2014-04-12 DIAGNOSIS — I776 Arteritis, unspecified: Secondary | ICD-10-CM

## 2014-04-12 DIAGNOSIS — M8448XA Pathological fracture, other site, initial encounter for fracture: Secondary | ICD-10-CM | POA: Insufficient documentation

## 2014-04-12 LAB — CBC WITH DIFFERENTIAL/PLATELET
BASO%: 0.8 % (ref 0.0–2.0)
Basophils Absolute: 0 10*3/uL (ref 0.0–0.1)
EOS%: 2.6 % (ref 0.0–7.0)
Eosinophils Absolute: 0.1 10*3/uL (ref 0.0–0.5)
HCT: 31.1 % — ABNORMAL LOW (ref 38.4–49.9)
HGB: 9.7 g/dL — ABNORMAL LOW (ref 13.0–17.1)
LYMPH%: 10.1 % — ABNORMAL LOW (ref 14.0–49.0)
MCH: 28.3 pg (ref 27.2–33.4)
MCHC: 31.1 g/dL — AB (ref 32.0–36.0)
MCV: 91 fL (ref 79.3–98.0)
MONO#: 1.5 10*3/uL — ABNORMAL HIGH (ref 0.1–0.9)
MONO%: 34.1 % — AB (ref 0.0–14.0)
NEUT#: 2.3 10*3/uL (ref 1.5–6.5)
NEUT%: 52.4 % (ref 39.0–75.0)
PLATELETS: 203 10*3/uL (ref 140–400)
RBC: 3.41 10*6/uL — AB (ref 4.20–5.82)
RDW: 21.2 % — AB (ref 11.0–14.6)
WBC: 4.4 10*3/uL (ref 4.0–10.3)
lymph#: 0.5 10*3/uL — ABNORMAL LOW (ref 0.9–3.3)

## 2014-04-12 LAB — PROTIME-INR

## 2014-04-12 LAB — PROTHROMBIN TIME
INR: 3.35 — AB (ref <1.50)
PROTHROMBIN TIME: 32.7 s — AB (ref 11.6–15.2)

## 2014-04-12 LAB — COMPREHENSIVE METABOLIC PANEL (CC13)
ALBUMIN: 2.7 g/dL — AB (ref 3.5–5.0)
ALK PHOS: 69 U/L (ref 40–150)
ALT: 10 U/L (ref 0–55)
AST: 6 U/L (ref 5–34)
Anion Gap: 13 mEq/L — ABNORMAL HIGH (ref 3–11)
BUN: 7.2 mg/dL (ref 7.0–26.0)
CALCIUM: 9 mg/dL (ref 8.4–10.4)
CO2: 22 mEq/L (ref 22–29)
CREATININE: 0.7 mg/dL (ref 0.7–1.3)
Chloride: 106 mEq/L (ref 98–109)
Glucose: 98 mg/dl (ref 70–140)
Potassium: 3.5 mEq/L (ref 3.5–5.1)
Sodium: 142 mEq/L (ref 136–145)
Total Bilirubin: 0.65 mg/dL (ref 0.20–1.20)
Total Protein: 6.1 g/dL — ABNORMAL LOW (ref 6.4–8.3)

## 2014-04-12 LAB — POCT INR: INR: 3.35

## 2014-04-12 MED ORDER — GADOBENATE DIMEGLUMINE 529 MG/ML IV SOLN
20.0000 mL | Freq: Once | INTRAVENOUS | Status: AC | PRN
Start: 2014-04-12 — End: 2014-04-12
  Administered 2014-04-12: 20 mL via INTRAVENOUS

## 2014-04-12 NOTE — Progress Notes (Signed)
**  No charge- telephone encounter**  INR = 3.35.  No bleeding or bruising.  No other changes to report.  Spoke to [place] in phone and instructed Mr Gendron to take coumadin 5mg  daily.  Will see Mr Urwin in infusion next week and f/u PT/INR.

## 2014-04-13 ENCOUNTER — Telehealth: Payer: Self-pay | Admitting: *Deleted

## 2014-04-13 ENCOUNTER — Other Ambulatory Visit: Payer: Self-pay | Admitting: Medical Oncology

## 2014-04-13 DIAGNOSIS — G8918 Other acute postprocedural pain: Secondary | ICD-10-CM

## 2014-04-13 DIAGNOSIS — G629 Polyneuropathy, unspecified: Secondary | ICD-10-CM

## 2014-04-13 DIAGNOSIS — C9000 Multiple myeloma not having achieved remission: Secondary | ICD-10-CM

## 2014-04-13 MED ORDER — MORPHINE SULFATE ER 60 MG PO TBCR: 60.0000 mg | EXTENDED_RELEASE_TABLET | Freq: Two times a day (BID) | ORAL | Status: DC

## 2014-04-13 NOTE — Telephone Encounter (Signed)
[name] reports that [name] is having increased pain in back and unable to move his left arm independently and uses his Right  arm to lift it. MRI reviewed with Dr Julien Nordmann. I transferred [name]'s call transferred to radiation. I informed [name] to pick up rx. Today

## 2014-04-13 NOTE — Telephone Encounter (Signed)
[facility] faxed Pomalyst refill request.  Request to provider's desk/in-basket for review. 

## 2014-04-14 ENCOUNTER — Ambulatory Visit
Admission: RE | Admit: 2014-04-14 | Discharge: 2014-04-14 | Disposition: A | Discharge status: Home / Self Care | Payer: 59 | Source: Ambulatory Visit | Attending: Radiation Oncology | Admitting: Radiation Oncology

## 2014-04-14 ENCOUNTER — Other Ambulatory Visit: Payer: Self-pay | Admitting: *Deleted

## 2014-04-14 DIAGNOSIS — C9000 Multiple myeloma not having achieved remission: Secondary | ICD-10-CM

## 2014-04-14 MED ORDER — POMALIDOMIDE 3 MG PO CAPS: 3.0000 mg | ORAL_CAPSULE | Freq: Every day | ORAL | Status: DC

## 2014-04-14 NOTE — Progress Notes (Signed)
  Radiation Oncology         [phone] ________________________________  Name: [name] MRN: [id number]  Date: 04/14/2014  DOB: Oct 06, 1961  SIMULATION AND TREATMENT PLANNING NOTE  DIAGNOSIS:  Multiple myeloma  NARRATIVE:  The patient was brought to the [facility].  Identity was confirmed.  All relevant records and images related to the planned course of therapy were reviewed.  The patient freely provided informed written consent to proceed with treatment after reviewing the details related to the planned course of therapy. The consent form was witnessed and verified by the simulation staff.  Then, the patient was set-up in a stable reproducible  supine position for radiation therapy.  CT images were obtained.  Surface markings were placed.  The CT images were loaded into the planning software.  Then the target and avoidance structures were contoured.  Treatment planning then occurred.  The radiation prescription was entered and confirmed.  Then, I designed and supervised the construction of a total of 3 medically necessary complex treatment devices.  I have requested : 3D Simulation  I have requested a DVH of the following structures: GTV, kidneys, spinal cord small bowel.  I have ordered:CBC  PLAN:  The patient will receive 25 Gy in 10 fractions.  ________________________________  -----------------------------------  Blair Promise, PhD, MD

## 2014-04-14 NOTE — Telephone Encounter (Signed)
Called [phone].  Message left with [employer] number with instructions for patient to take patient survey to obtain next refill.  Message marked urgent.

## 2014-04-14 NOTE — Progress Notes (Signed)
   Department of Radiation Oncology  Phone:  [phone] Fax:        [phone]  Special treatment procedure note  Today the patient underwent planning to begin radiation therapy to the lumbosacral spine. Patient has received previous radiation treatment directed at the left pelvis and proximal femur. Additional time was taken in reviewing the patient's previous treatment as it relates to his current set up.  given this the additional time, this constitutes a special treatment procedure.  -----------------------------------  Blair Promise, PhD, MD

## 2014-04-18 ENCOUNTER — Ambulatory Visit (HOSPITAL_BASED_OUTPATIENT_CLINIC_OR_DEPARTMENT_OTHER): Payer: 59 | Admitting: Physician Assistant

## 2014-04-18 ENCOUNTER — Ambulatory Visit
Admission: RE | Admit: 2014-04-18 | Discharge: 2014-04-18 | Disposition: A | Discharge status: Home / Self Care | Payer: 59 | Source: Ambulatory Visit | Attending: Radiation Oncology | Admitting: Radiation Oncology

## 2014-04-18 ENCOUNTER — Encounter: Payer: Self-pay | Admitting: Physician Assistant

## 2014-04-18 ENCOUNTER — Other Ambulatory Visit (HOSPITAL_BASED_OUTPATIENT_CLINIC_OR_DEPARTMENT_OTHER): Payer: 59

## 2014-04-18 ENCOUNTER — Ambulatory Visit: Payer: 59 | Admitting: Pharmacist

## 2014-04-18 ENCOUNTER — Telehealth: Payer: Self-pay | Admitting: Internal Medicine

## 2014-04-18 ENCOUNTER — Ambulatory Visit (HOSPITAL_BASED_OUTPATIENT_CLINIC_OR_DEPARTMENT_OTHER): Payer: 59

## 2014-04-18 VITALS — BP 145/76 | HR 81 | Temp 98.9°F | Resp 18 | Ht 63.0 in | Wt 220.9 lb

## 2014-04-18 DIAGNOSIS — C9000 Multiple myeloma not having achieved remission: Secondary | ICD-10-CM

## 2014-04-18 DIAGNOSIS — I82409 Acute embolism and thrombosis of unspecified deep veins of unspecified lower extremity: Secondary | ICD-10-CM

## 2014-04-18 DIAGNOSIS — M949 Disorder of cartilage, unspecified: Secondary | ICD-10-CM

## 2014-04-18 DIAGNOSIS — M899 Disorder of bone, unspecified: Secondary | ICD-10-CM

## 2014-04-18 DIAGNOSIS — C9002 Multiple myeloma in relapse: Secondary | ICD-10-CM

## 2014-04-18 DIAGNOSIS — Z5112 Encounter for antineoplastic immunotherapy: Secondary | ICD-10-CM

## 2014-04-18 DIAGNOSIS — I776 Arteritis, unspecified: Secondary | ICD-10-CM

## 2014-04-18 DIAGNOSIS — B370 Candidal stomatitis: Secondary | ICD-10-CM

## 2014-04-18 DIAGNOSIS — R52 Pain, unspecified: Secondary | ICD-10-CM

## 2014-04-18 LAB — CBC WITH DIFFERENTIAL/PLATELET
BASO%: 3.9 % — ABNORMAL HIGH (ref 0.0–2.0)
Basophils Absolute: 0.1 10*3/uL (ref 0.0–0.1)
EOS%: 1.7 % (ref 0.0–7.0)
Eosinophils Absolute: 0.1 10*3/uL (ref 0.0–0.5)
HCT: 30.7 % — ABNORMAL LOW (ref 38.4–49.9)
HGB: 9.7 g/dL — ABNORMAL LOW (ref 13.0–17.1)
LYMPH%: 9.4 % — AB (ref 14.0–49.0)
MCH: 28.6 pg (ref 27.2–33.4)
MCHC: 31.5 g/dL — ABNORMAL LOW (ref 32.0–36.0)
MCV: 90.7 fL (ref 79.3–98.0)
MONO#: 1.1 10*3/uL — AB (ref 0.1–0.9)
MONO%: 30.9 % — ABNORMAL HIGH (ref 0.0–14.0)
NEUT#: 1.9 10*3/uL (ref 1.5–6.5)
NEUT%: 54.1 % (ref 39.0–75.0)
Platelets: 383 10*3/uL (ref 140–400)
RBC: 3.39 10*6/uL — AB (ref 4.20–5.82)
RDW: 20.9 % — ABNORMAL HIGH (ref 11.0–14.6)
WBC: 3.6 10*3/uL — AB (ref 4.0–10.3)
lymph#: 0.3 10*3/uL — ABNORMAL LOW (ref 0.9–3.3)

## 2014-04-18 LAB — PROTHROMBIN TIME
INR: 4.12 — AB (ref <1.50)
PROTHROMBIN TIME: 38.3 s — AB (ref 11.6–15.2)

## 2014-04-18 LAB — COMPREHENSIVE METABOLIC PANEL (CC13)
ALK PHOS: 93 U/L (ref 40–150)
ALT: 10 U/L (ref 0–55)
ANION GAP: 16 meq/L — AB (ref 3–11)
AST: 15 U/L (ref 5–34)
Albumin: 2.8 g/dL — ABNORMAL LOW (ref 3.5–5.0)
BILIRUBIN TOTAL: 0.67 mg/dL (ref 0.20–1.20)
BUN: 14.3 mg/dL (ref 7.0–26.0)
CO2: 20 mEq/L — ABNORMAL LOW (ref 22–29)
CREATININE: 0.8 mg/dL (ref 0.7–1.3)
Calcium: 9.1 mg/dL (ref 8.4–10.4)
Chloride: 104 mEq/L (ref 98–109)
GLUCOSE: 106 mg/dL (ref 70–140)
Potassium: 3.4 mEq/L — ABNORMAL LOW (ref 3.5–5.1)
SODIUM: 140 meq/L (ref 136–145)
TOTAL PROTEIN: 6.3 g/dL — AB (ref 6.4–8.3)

## 2014-04-18 LAB — PROTIME-INR

## 2014-04-18 LAB — POCT INR: INR: 4.12

## 2014-04-18 MED ORDER — SODIUM CHLORIDE 0.9 % IJ SOLN
10.0000 mL | INTRAMUSCULAR | Status: DC | PRN
  Administered 2014-04-18: 10 mL
  Filled 2014-04-18: qty 10

## 2014-04-18 MED ORDER — DEXAMETHASONE 4 MG PO TABS: 40.0000 mg | ORAL_TABLET | ORAL | Status: AC

## 2014-04-18 MED ORDER — DEXTROSE 5 % IV SOLN
56.0000 mg/m2 | Freq: Once | INTRAVENOUS | Status: AC
  Administered 2014-04-18: 118 mg via INTRAVENOUS
  Filled 2014-04-18: qty 59

## 2014-04-18 MED ORDER — DEXAMETHASONE SODIUM PHOSPHATE 10 MG/ML IJ SOLN
10.0000 mg | Freq: Once | INTRAMUSCULAR | Status: AC
  Administered 2014-04-18: 10 mg via INTRAVENOUS

## 2014-04-18 MED ORDER — ONDANSETRON 8 MG/NS 50 ML IVPB
INTRAVENOUS | Status: AC
  Filled 2014-04-18: qty 8

## 2014-04-18 MED ORDER — HEPARIN SOD (PORK) LOCK FLUSH 100 UNIT/ML IV SOLN
500.0000 [IU] | Freq: Once | INTRAVENOUS | Status: AC | PRN
  Administered 2014-04-18: 500 [IU]
  Filled 2014-04-18: qty 5

## 2014-04-18 MED ORDER — SODIUM CHLORIDE 0.9 % IV SOLN
Freq: Once | INTRAVENOUS | Status: AC
  Administered 2014-04-18: 10:00:00 via INTRAVENOUS

## 2014-04-18 MED ORDER — ONDANSETRON 8 MG/50ML IVPB (CHCC)
8.0000 mg | Freq: Once | INTRAVENOUS | Status: AC
  Administered 2014-04-18: 8 mg via INTRAVENOUS

## 2014-04-18 MED ORDER — DEXAMETHASONE 4 MG PO TABS: 40.0000 mg | ORAL_TABLET | ORAL | Status: DC

## 2014-04-18 MED ORDER — RADIAPLEXRX EX GEL
Freq: Once | CUTANEOUS | Status: AC
  Administered 2014-04-18: 13:00:00 via TOPICAL

## 2014-04-18 MED ORDER — NYSTATIN 100000 UNIT/ML MT SUSP: 5.0000 mL | Freq: Four times a day (QID) | OROMUCOSAL | Status: DC

## 2014-04-18 MED ORDER — DEXAMETHASONE SODIUM PHOSPHATE 10 MG/ML IJ SOLN
INTRAMUSCULAR | Status: AC
  Filled 2014-04-18: qty 1

## 2014-04-18 NOTE — Progress Notes (Signed)
[name] was given the Radiation Therapy and You book and discussed potential side effects/management of fatigue, nausea and skin changes.  He was given radiaplex gel and was instructed to apply it to the treatment area after treatment and at bedtime.  He was educated about under treat day with Dr. Sondra Come on Tuesday's.  He was instructed to call with any questions or concerns.

## 2014-04-18 NOTE — Patient Instructions (Signed)
[facility] Discharge Instructions for Patients Receiving Chemotherapy  Today you received the following chemotherapy agents Kyprolis To help prevent nausea and vomiting after your treatment, we encourage you to take your nausea medication as prescribed.  If you develop nausea and vomiting that is not controlled by your nausea medication, call the clinic.   BELOW ARE SYMPTOMS THAT SHOULD BE REPORTED IMMEDIATELY:  *FEVER GREATER THAN 100.5 F  *CHILLS WITH OR WITHOUT FEVER  NAUSEA AND VOMITING THAT IS NOT CONTROLLED WITH YOUR NAUSEA MEDICATION  *UNUSUAL SHORTNESS OF BREATH  *UNUSUAL BRUISING OR BLEEDING  TENDERNESS IN MOUTH AND THROAT WITH OR WITHOUT PRESENCE OF ULCERS  *URINARY PROBLEMS  *BOWEL PROBLEMS  UNUSUAL RASH Items with * indicate a potential emergency and should be followed up as soon as possible.  Feel free to call the clinic you have any questions or concerns. The clinic phone number is [phone].    

## 2014-04-18 NOTE — Progress Notes (Signed)
  Radiation Oncology         [phone] ________________________________  Name: [name] MRN: [id number]  Date: 04/18/2014  DOB: 1961-02-12  Simulation Verification Note  Multiple myeloma   Primary site: Multiple Myeloma   Staging method: AJCC 6th Edition   Clinical: Stage IIIB signed by Curt Bears, MD on 12/27/2013  9:47 PM   Summary: Stage IIIB   Status: outpatient  NARRATIVE: The patient was brought to the treatment unit and placed in the planned treatment position. The clinical setup was verified. Then port films were obtained and uploaded to the radiation oncology medical record software.  The treatment beams were carefully compared against the planned radiation fields. The position location and shape of the radiation fields was reviewed. They targeted volume of tissue appears to be appropriately covered by the radiation beams. Organs at risk appear to be excluded as planned.  Based on my personal review, I approved the simulation verification. The patient's treatment will proceed as planned.  ------------------------------------------------  Sheral Apley Tammi Klippel, M.D.

## 2014-04-18 NOTE — Telephone Encounter (Signed)
gv adn printed appt sched and avs for pt for JUne and July....sed added tx.

## 2014-04-18 NOTE — Patient Instructions (Signed)
Take the nystatin 5 MLS (1 teaspoon) by mouth swish and spit 4 times daily Continue labs and chemotherapy as scheduled Followup in 2 weeks

## 2014-04-18 NOTE — Progress Notes (Signed)
[facility] [phone]   [phone]  OFFICE PROGRESS NOTE  [name]., MD [place] [id number]  Principle Diagnosis:  #1 recurrent multiple myeloma IgG kappa subtype diagnosed in June of 2008  #2 history of vasculitis and thrombosis of skin lesions   Prior Therapy:  #1 status post palliative radiotherapy to the left hip under the care of Dr. Sondra Come  #2 status post 5 cycles of systemic chemotherapy with Revlimid and low dose Decadron. Last dose given June 2009 with good response.  #3 status post autologous peripheral blood stem cell transplant at [facility] on 02/02/2008.  #4 the patient had evidence for disease recurrence in December 2010.  #5 Revlimid 25 mg by mouth daily for 21 days every 4 weeks in addition to Decadron 40 mg orally on a weekly basis. The patient is status post 28 cycles, discontinued today secondary to disease progression.  #6 Systemic chemotherapy with Velcade 1.3 mg/M2 on days 1, 4, 8 and 11 in addition to Doxil 30 mg/M2 on day 4 and Decadron 40 mg by mouth on weekly basis every 3 weeks. Status post 3 cycles, last dose was given 05/04/2012 discontinued secondary to intolerance.  #7 Salvage therapy treatment with the Pomalyst 4 mg by mouth daily for 21 days every 28 days as well as Cytoxan 50 mg by mouth every other day and dexamethasone 40 mg by mouth once weekly. Therapy beginning 09/25/2012, status post 2 cycles discontinued secondary to disease progression and intolerance.  #8 Kyprolis (Carfilzomib) 27 mg/M2 on days 1, 2, 8, 9, 15 and 16 every 4 weeks. First dose on 02/08/2013. This would be concurrent with the dexamethasone 40 mg by mouth on a weekly basis. Status post 3 cycles with disease progression.  #9 Systemic chemotherapy with Carfilzomib 27 mg/M2 days 1,2 , 8, 9, 15 and 16, Cytoxan 300 mg/M2 and dexamethasone 40 mg by mouth weekly every 4 weeks. Status post 4 cycles. Last dose was  given 08/09/2013 discontinued today secondary to disease progression.  Current therapy:  1) Systemic chemotherapy with Carfilzomib 56 mg/M2 days 1,2 , 8, 9, 15 and 16, Pomalyst 3 mg by mouth daily for 21 days every 4 weeks and dexamethasone 40 mg by mouth weekly every 4 weeks. First cycle started on 10/05/2013. The patient missed day 8 and 9 of the first cycle secondary to thrombocytopenia. Status post 7 cycles. 2) Zometa 4 mg IV given every 3 months.    INTERVAL HISTORY: [name] 53 y.o. male returns to the clinic today for a followup visit accompanied by his wife.  He tolerated the last cycle of his treatment with Carfilzomib, Pomalyst and dexamethasone fairly well except for increasing fatigue.  He reports that he really wasn't eating well when he was feeling so badly. The increase in his pain medication has helped tremendously. He presents today for day 1 of cycle #8. He reports that he has followed up with Dr. Sondra Come and is to start palliative radiotherapy  Beginning later this evening.He is using a walker at home at times to help with ambulation. He denies any fever, chills, diarrhea or constipation. He has no chest pain, shortness of breath, cough or hemoptysis. The patient denied having any nausea or vomiting. He has no weight loss or night sweats. He denies any numbness or tingling or weakness and lower extremities. He has full control of his bowel and bladder habits.    MEDICAL HISTORY: Past Medical History  Diagnosis Date  . Multiple myeloma 09/24/2011  . Hypertension   . History of radiation therapy 07/09/2007-07/30/07    3000 cGy to left pelvis and proximal femur    ALLERGIES:  is allergic to red dye and other.  MEDICATIONS:  Current Outpatient Prescriptions  Medication Sig Dispense Refill  . benzonatate (TESSALON) 200 MG capsule Take 1 capsule (200 mg total) by mouth 3 (three) times daily as needed for cough.  20 capsule  0  . chlorpheniramine-HYDROcodone (TUSSIONEX) 10-8  MG/5ML LQCR Take 5 mLs by mouth every 12 (twelve) hours as needed for cough.  115 mL  0  . cloNIDine (CATAPRES) 0.2 MG tablet Take 0.2 mg by mouth 3 (three) times daily.      [name] dexamethasone (DECADRON) 4 MG tablet Take 10 tablets (40 mg total) by mouth once a week. Mondays with chemo  40 tablet  2  . furosemide (LASIX) 20 MG tablet Take 10m (1 tablet) daily, alternating with 439m(2 tablets)  every other day.  45 tablet  0  . gabapentin (NEURONTIN) 300 MG capsule Take 1 capsule (300 mg total) by mouth 3 (three) times daily.  90 capsule  2  . HYDROcodone-acetaminophen (NORCO) 10-325 MG per tablet Take 1-2 tablets by mouth every 6 (six) hours as needed. For pain.  40 tablet  0  . lidocaine-prilocaine (EMLA) cream Apply 1 application topically as needed.  30 g  0  . morphine (MS CONTIN) 60 MG 12 hr tablet Take 1 tablet (60 mg total) by mouth every 12 (twelve) hours.  60 tablet  0  . Nebivolol HCl (BYSTOLIC) 20 MG TABS Take 30 mg by mouth daily.      . Marland Kitchenmeprazole (PRILOSEC) 40 MG capsule Take 40 mg by mouth daily as needed (for heartburn).       . pomalidomide (POMALYST) 3 MG capsule Take 1 capsule (3 mg total) by mouth daily. Take with water on days 1-21. Repeat every 28 days.  21 capsule  0  . potassium chloride SA (K-DUR,KLOR-CON) 20 MEQ tablet TAKE 1 TABLET BY MOUTH EVERY DAY  30 tablet  0  . PRESCRIPTION MEDICATION [facility] Mo[facility]arlie Server    . prochlorperazine (COMPAZINE) 10 MG tablet Take 10 mg by mouth every 6 (six) hours as needed for nausea.      . Marland Kitchenarfarin (COUMADIN) 5 MG tablet Take 5 mg by mouth daily except 7.5 mg on Mondays, or as directed  50 tablet  1  . oxyCODONE-acetaminophen (PERCOCET/ROXICET) 5-325 MG per tablet Take 1 tablet by mouth every 6 (six) hours as needed for moderate pain.       No current facility-administered medications for this visit.   Facility-Administered Medications Ordered in Other Visits  Medication Dose Route Frequency  Provider Last Rate Last Dose  . 0.9 %  sodium chloride infusion   Intravenous Once Bijon Mineer E Gloriann Riede, PA-C      . sodium chloride 0.9 % injection 10 mL  10 mL Intracatheter PRN MoCurt BearsMD   10 mL at 07/27/13 1617  . sodium chloride 0.9 % injection 10 mL  10 mL Intracatheter PRN AdCarlton AdamPA-C        SURGICAL HISTORY:  Past Surgical History  Procedure Laterality Date  . Video bronchoscopy  12/11/2012    Procedure: VIDEO BRONCHOSCOPY;  Surgeon: PeIvin PootMD;  [facility];  Laterality: N/A;  . Video assisted thoracoscopy  12/11/2012  Procedure: VIDEO ASSISTED THORACOSCOPY;  Surgeon: Ivin Poot, MD;  Location: [facility];  Service: Thoracic;  Laterality: Right;  . Decortication  12/11/2012    Procedure: DECORTICATION;  Surgeon: Ivin Poot, MD;  Location: [facility] OR;  Service: Thoracic;  Laterality: N/A;    REVIEW OF SYSTEMS:  Constitutional: positive for anorexia and weight loss Eyes: negative Ears, nose, mouth, throat, and face: negative Respiratory: negative Cardiovascular: positive for lower extremity edema Gastrointestinal: negative Genitourinary:negative Integument/breast: negative Hematologic/lymphatic: negative Musculoskeletal:positive for bone pain Neurological: negative Behavioral/Psych: negative Endocrine: negative Allergic/Immunologic: negative   PHYSICAL EXAMINATION: General appearance: alert, cooperative and no distress Head: Normocephalic, without obvious abnormality, atraumatic, Nontender Neck: no adenopathy, no JVD, supple, symmetrical, trachea midline and thyroid not enlarged, symmetric, no tenderness/mass/nodules Lymph nodes: Cervical, supraclavicular, and axillary nodes normal. Resp: clear to auscultation bilaterally Back: symmetric, no curvature. ROM normal. No CVA tenderness. Cardio: regular rate and rhythm, S1, S2 normal, no murmur, click, rub or gallop GI: soft, non-tender; bowel sounds normal; no masses,  no  organomegaly Extremities: edema trace pitting edema bilateral lower extremities Neurologic: Grossly normal Mouth: reveals thrush  ECOG PERFORMANCE STATUS: 1 - Symptomatic but completely ambulatory  Blood pressure 145/76, pulse 81, temperature 98.9 F (37.2 C), temperature source Oral, resp. rate 18, height '5\' 3"'  (1.6 m), weight 220 lb 14.4 oz (100.2 kg).  LABORATORY DATA: Lab Results  Component Value Date   WBC 3.6* 04/18/2014   HGB 9.7* 04/18/2014   HCT 30.7* 04/18/2014   MCV 90.7 04/18/2014   PLT 383 04/18/2014      Chemistry      Component Value Date/Time   NA 142 04/12/2014 0755   NA 136 03/16/2013 1930   K 3.5 04/12/2014 0755   K 3.8 03/16/2013 1930   CL 112* 05/10/2013 0849   CL 110 03/16/2013 1930   CO2 22 04/12/2014 0755   CO2 16* 03/16/2013 1930   BUN 7.2 04/12/2014 0755   BUN 22 03/16/2013 1930   CREATININE 0.7 04/12/2014 0755   CREATININE 1.24 03/16/2013 1930      Component Value Date/Time   CALCIUM 9.0 04/12/2014 0755   CALCIUM 8.2* 03/16/2013 1930   ALKPHOS 69 04/12/2014 0755   ALKPHOS 31* 03/16/2013 1930   AST 6 04/12/2014 0755   AST 11 03/16/2013 1930   ALT 10 04/12/2014 0755   ALT 15 03/16/2013 1930   BILITOT 0.65 04/12/2014 0755   BILITOT 0.2* 03/16/2013 1930     Radiology: Dg Bone Survey Met  02/24/2014   CLINICAL DATA:  Multiple Myeloma, Staging  EXAM: METASTATIC BONE SURVEY  COMPARISON:  DG BONE SURVEY MET dated 12/02/2011  FINDINGS: Lateral skull single-view: The scattered small lucencies within the skull stable.  Cervical fluid spine two-view: Visualized portions cervical spine demonstrate no new lytic foci.  Two view thoracic spine: Stable chronic endplate deformity T2 without evidence of lytic of appearing foci. Degenerative disc disease changes at the T9-10 level.  Two view lumbar spine: There has been interval development of compression deformities involving L1 and L2. Age indeterminate. No lytic foci.  Left shoulder and humerus: There are areas concerning for very small  scattered lytic foci within the humerus.  Right shoulder and humerus: Findings concerning for small scattered lytic foci within the humerus.  Right femur:  No lytic foci  Left femur:  No lytic foci  Pelvis: There has been slight increase in the lytic foci involving the left iliac wing and left portion of the sacrum. Degenerative changes also appreciated  involving the right and left hips and the sacroiliac regions.  Frontal view of the chest demonstrates hyperinflation. A fracture with overriding nonunion appreciated involving posterior aspect of the sixth rib on the right. Age indeterminate. No focal regions of consolidation or focal infiltrates.  IMPRESSION: Slight interval progression of disease within the left portion of the pelvis and sacrum.  Findings concerning for progression of disease within the right and left humeral head regions.  The disease within the skull appears stable.  Compression deformities involving L1 and L2 which have occurred in the interim age indeterminate.  Age indeterminate rib fracture involving the sixth rib on the right.  The above described fractures may represent posttraumatic events with no areas of pathologic fracture is in the patient's history cannot be excluded.   Electronically Signed   By: Margaree Mackintosh M.D.   On: 02/24/2014 08:43   ASSESSMENT AND PLAN: The patient is a very pleasant 53 years old African American male with recurrent multiple myeloma currently on treatment with Carfilzomib 56 mg/M2, Pomalyst 3 mg by mouth daily for 21 days every 3 weeks in addition to Decadron 40 mg on a weekly basis. He is status post 7 cycles.  The patient is tolerating his treatment fairly well with no significant adverse effects except for the generalized fatigue and bone pain. His most recent protein studies revealed that his beta-2 microglobulin is lower at 3.33 down from 4.99 approximately 2 months ago, the Kappa free light chain is slightly lower at 29.40 previously 29.80, lambda  free light chain is now in the normal range is 0.77 with kappa to lambda ratio of 38.18 previously 56.23, LDH is lower at 342 previously 415, IgG now 296, previously 481, IgA 22 previously 23, IgM 9 previously 19.    He is to continue with radiation therapy under the care of Dr. Sondra Come.he requests a refill for his dexamethasone and a prescription for this as well as a prescription for nystatin suspension to address the oral candidiasis was sent to his pharmacy of record via E. scribed. He will continue with his labs and chemotherapy as scheduled. He'll followup in 2 weeks for another symptom management visit.  He was advised to call immediately if she has any concerning symptoms in the interval.  The patient voices understanding of current disease status and treatment options and is in agreement with the current care plan.  All questions were answered. The patient knows to call the clinic with any problems, questions or concerns. We can certainly see the patient much sooner if necessary.   Carlton Adam, PA-C 04/18/2014  Disclaimer: This note was dictated with voice recognition software. Similar sounding words can inadvertently be transcribed and may not be corrected upon review.

## 2014-04-18 NOTE — Progress Notes (Signed)
INR = 4.12 on Coumadin 5 mg daily Pt has poor PO intake. No bleeding. Med changes recently: D/C'd Norvasc & now is on Bystolic & Clonidine.  None of these changes have obvious cause for elevated INR's. INR remains elevated despite recent decreases in pts Coumadin dose.  Likely due to decreased PO intake. I have advised pt to hold his Coumadin today then decrease his dose to 5 mg daily except 2.5 mg on Tues/Thurs/Sat. Repeat protime in 1 week.  We'll see in infusion. Kennith Center, Pharm.D., CPP 04/18/2014@10 :58 AM

## 2014-04-19 ENCOUNTER — Ambulatory Visit (HOSPITAL_BASED_OUTPATIENT_CLINIC_OR_DEPARTMENT_OTHER): Payer: 59

## 2014-04-19 ENCOUNTER — Ambulatory Visit
Admission: RE | Admit: 2014-04-19 | Discharge: 2014-04-19 | Disposition: A | Discharge status: Home / Self Care | Payer: 59 | Source: Ambulatory Visit | Attending: Radiation Oncology | Admitting: Radiation Oncology

## 2014-04-19 VITALS — BP 145/80 | HR 76 | Temp 98.6°F | Resp 20 | Ht 63.0 in | Wt 224.5 lb

## 2014-04-19 VITALS — BP 141/70 | HR 71 | Temp 97.8°F | Resp 18

## 2014-04-19 DIAGNOSIS — C9000 Multiple myeloma not having achieved remission: Secondary | ICD-10-CM

## 2014-04-19 DIAGNOSIS — Z5112 Encounter for antineoplastic immunotherapy: Secondary | ICD-10-CM

## 2014-04-19 DIAGNOSIS — C9002 Multiple myeloma in relapse: Secondary | ICD-10-CM

## 2014-04-19 MED ORDER — HEPARIN SOD (PORK) LOCK FLUSH 100 UNIT/ML IV SOLN
500.0000 [IU] | Freq: Once | INTRAVENOUS | Status: AC | PRN
  Administered 2014-04-19: 500 [IU]
  Filled 2014-04-19: qty 5

## 2014-04-19 MED ORDER — DEXAMETHASONE SODIUM PHOSPHATE 10 MG/ML IJ SOLN
10.0000 mg | Freq: Once | INTRAMUSCULAR | Status: AC
  Administered 2014-04-19: 10 mg via INTRAVENOUS

## 2014-04-19 MED ORDER — DEXAMETHASONE SODIUM PHOSPHATE 10 MG/ML IJ SOLN
INTRAMUSCULAR | Status: AC
  Filled 2014-04-19: qty 1

## 2014-04-19 MED ORDER — ONDANSETRON 8 MG/50ML IVPB (CHCC)
8.0000 mg | Freq: Once | INTRAVENOUS | Status: AC
  Administered 2014-04-19: 8 mg via INTRAVENOUS

## 2014-04-19 MED ORDER — SODIUM CHLORIDE 0.9 % IV SOLN
Freq: Once | INTRAVENOUS | Status: AC
  Administered 2014-04-19: 08:00:00 via INTRAVENOUS

## 2014-04-19 MED ORDER — CARFILZOMIB CHEMO INJECTION 60 MG
56.0000 mg/m2 | Freq: Once | INTRAVENOUS | Status: AC
  Administered 2014-04-19: 118 mg via INTRAVENOUS
  Filled 2014-04-19: qty 59

## 2014-04-19 MED ORDER — SODIUM CHLORIDE 0.9 % IJ SOLN
10.0000 mL | INTRAMUSCULAR | Status: DC | PRN
  Administered 2014-04-19: 10 mL
  Filled 2014-04-19: qty 10

## 2014-04-19 MED ORDER — ONDANSETRON 8 MG/NS 50 ML IVPB
INTRAVENOUS | Status: AC
  Filled 2014-04-19: qty 8

## 2014-04-19 NOTE — Patient Instructions (Signed)
[facility] Discharge Instructions for Patients Receiving Chemotherapy  Today you received the following chemotherapy agents Kyprolis To help prevent nausea and vomiting after your treatment, we encourage you to take your nausea medication as prescribed.  If you develop nausea and vomiting that is not controlled by your nausea medication, call the clinic.   BELOW ARE SYMPTOMS THAT SHOULD BE REPORTED IMMEDIATELY:  *FEVER GREATER THAN 100.5 F  *CHILLS WITH OR WITHOUT FEVER  NAUSEA AND VOMITING THAT IS NOT CONTROLLED WITH YOUR NAUSEA MEDICATION  *UNUSUAL SHORTNESS OF BREATH  *UNUSUAL BRUISING OR BLEEDING  TENDERNESS IN MOUTH AND THROAT WITH OR WITHOUT PRESENCE OF ULCERS  *URINARY PROBLEMS  *BOWEL PROBLEMS  UNUSUAL RASH Items with * indicate a potential emergency and should be followed up as soon as possible.  Feel free to call the clinic you have any questions or concerns. The clinic phone number is [phone].    

## 2014-04-19 NOTE — Progress Notes (Signed)
  Radiation Oncology         [phone] ________________________________  Name: [name] MRN: [id number]  Date: 04/19/2014  DOB: 04-24-1961  Weekly Radiation Therapy Management  Multiple myeloma   Primary site: Multiple Myeloma   Staging method: AJCC 6th Edition   Clinical: Stage IIIB    Summary: Stage IIIB  Current Dose: 5 Gy     Planned Dose:  20 Gy  Narrative . . . . . . . . The patient presents for routine under treatment assessment.                                   The patient is without complaint.  He denies any pain at this time but did receive a bolus of Decadron yesterday with his chemotherapy                                 Set-up films were reviewed.                                 The chart was checked. Physical Findings. . .  height is _0  (1.6 m) and weight is 224 lb 8 oz (101.833 kg). His oral temperature is 98.6 F (37 C). His blood pressure is 145/80 and his pulse is 76. His respiration is 20 and oxygen saturation is 99%. .  The lungs are clear. The heart has a regular rhythm and rate. The abdomen is soft and nontender with normal bowel sounds. Impression . . . . . . . The patient is tolerating radiation. Plan . . . . . . . . . . . . Continue treatment as planned.  ________________________________   Blair Promise, PhD, MD

## 2014-04-19 NOTE — Progress Notes (Addendum)
[name] has had 2 fractions to his L1-S2 spine.  He denies pain.  He has been taking morphine 60 mg q 12 hours and norco as needed.  He denies nausea and fatigue.  He is using a walker at home.  He had chemo yesterday and will have it again today.

## 2014-04-20 ENCOUNTER — Ambulatory Visit
Admission: RE | Admit: 2014-04-20 | Discharge: 2014-04-20 | Disposition: A | Discharge status: Home / Self Care | Payer: 59 | Source: Ambulatory Visit | Attending: Radiation Oncology | Admitting: Radiation Oncology

## 2014-04-21 ENCOUNTER — Ambulatory Visit
Admission: RE | Admit: 2014-04-21 | Discharge: 2014-04-21 | Disposition: A | Discharge status: Home / Self Care | Payer: 59 | Source: Ambulatory Visit | Attending: Radiation Oncology | Admitting: Radiation Oncology

## 2014-04-22 ENCOUNTER — Ambulatory Visit
Admission: RE | Admit: 2014-04-22 | Discharge: 2014-04-22 | Disposition: A | Discharge status: Home / Self Care | Payer: 59 | Source: Ambulatory Visit | Attending: Radiation Oncology | Admitting: Radiation Oncology

## 2014-04-25 ENCOUNTER — Ambulatory Visit (HOSPITAL_BASED_OUTPATIENT_CLINIC_OR_DEPARTMENT_OTHER): Payer: 59

## 2014-04-25 ENCOUNTER — Ambulatory Visit (HOSPITAL_BASED_OUTPATIENT_CLINIC_OR_DEPARTMENT_OTHER): Payer: Self-pay | Admitting: Pharmacist

## 2014-04-25 ENCOUNTER — Other Ambulatory Visit: Payer: 59

## 2014-04-25 ENCOUNTER — Ambulatory Visit
Admission: RE | Admit: 2014-04-25 | Discharge: 2014-04-25 | Disposition: A | Discharge status: Home / Self Care | Payer: 59 | Source: Ambulatory Visit | Attending: Radiation Oncology | Admitting: Radiation Oncology

## 2014-04-25 ENCOUNTER — Other Ambulatory Visit (HOSPITAL_BASED_OUTPATIENT_CLINIC_OR_DEPARTMENT_OTHER): Payer: 59

## 2014-04-25 VITALS — BP 142/66 | HR 77 | Temp 98.5°F

## 2014-04-25 DIAGNOSIS — C9002 Multiple myeloma in relapse: Secondary | ICD-10-CM

## 2014-04-25 DIAGNOSIS — I82409 Acute embolism and thrombosis of unspecified deep veins of unspecified lower extremity: Secondary | ICD-10-CM

## 2014-04-25 DIAGNOSIS — C9000 Multiple myeloma not having achieved remission: Secondary | ICD-10-CM

## 2014-04-25 DIAGNOSIS — I776 Arteritis, unspecified: Secondary | ICD-10-CM

## 2014-04-25 DIAGNOSIS — Z5112 Encounter for antineoplastic immunotherapy: Secondary | ICD-10-CM

## 2014-04-25 LAB — CBC WITH DIFFERENTIAL/PLATELET
BASO%: 0.4 % (ref 0.0–2.0)
Basophils Absolute: 0 10*3/uL (ref 0.0–0.1)
EOS%: 1.5 % (ref 0.0–7.0)
Eosinophils Absolute: 0.1 10*3/uL (ref 0.0–0.5)
HCT: 29.9 % — ABNORMAL LOW (ref 38.4–49.9)
HGB: 9.4 g/dL — ABNORMAL LOW (ref 13.0–17.1)
LYMPH%: 6.8 % — ABNORMAL LOW (ref 14.0–49.0)
MCH: 28.5 pg (ref 27.2–33.4)
MCHC: 31.5 g/dL — AB (ref 32.0–36.0)
MCV: 90.5 fL (ref 79.3–98.0)
MONO#: 0.9 10*3/uL (ref 0.1–0.9)
MONO%: 21.3 % — ABNORMAL HIGH (ref 0.0–14.0)
NEUT#: 2.8 10*3/uL (ref 1.5–6.5)
NEUT%: 70 % (ref 39.0–75.0)
Platelets: 89 10*3/uL — ABNORMAL LOW (ref 140–400)
RBC: 3.3 10*6/uL — AB (ref 4.20–5.82)
RDW: 21.3 % — AB (ref 11.0–14.6)
WBC: 4.1 10*3/uL (ref 4.0–10.3)
lymph#: 0.3 10*3/uL — ABNORMAL LOW (ref 0.9–3.3)

## 2014-04-25 LAB — COMPREHENSIVE METABOLIC PANEL (CC13)
ALBUMIN: 2.7 g/dL — AB (ref 3.5–5.0)
ALT: 10 U/L (ref 0–55)
AST: 11 U/L (ref 5–34)
Alkaline Phosphatase: 83 U/L (ref 40–150)
Anion Gap: 16 mEq/L — ABNORMAL HIGH (ref 3–11)
BILIRUBIN TOTAL: 0.89 mg/dL (ref 0.20–1.20)
BUN: 9 mg/dL (ref 7.0–26.0)
CO2: 17 meq/L — AB (ref 22–29)
Calcium: 9 mg/dL (ref 8.4–10.4)
Chloride: 107 mEq/L (ref 98–109)
Creatinine: 0.7 mg/dL (ref 0.7–1.3)
GLUCOSE: 87 mg/dL (ref 70–140)
POTASSIUM: 4.3 meq/L (ref 3.5–5.1)
SODIUM: 140 meq/L (ref 136–145)
TOTAL PROTEIN: 5.9 g/dL — AB (ref 6.4–8.3)

## 2014-04-25 LAB — POCT INR: INR: 4.1

## 2014-04-25 LAB — PROTIME-INR
INR: 4.1 — ABNORMAL HIGH (ref 2.00–3.50)
PROTIME: 49.2 s — AB (ref 10.6–13.4)

## 2014-04-25 MED ORDER — SODIUM CHLORIDE 0.9 % IJ SOLN
10.0000 mL | INTRAMUSCULAR | Status: DC | PRN
  Administered 2014-04-25: 10 mL
  Filled 2014-04-25: qty 10

## 2014-04-25 MED ORDER — SODIUM CHLORIDE 0.9 % IV SOLN
Freq: Once | INTRAVENOUS | Status: AC
  Administered 2014-04-25: 10:00:00 via INTRAVENOUS

## 2014-04-25 MED ORDER — ONDANSETRON 8 MG/NS 50 ML IVPB
INTRAVENOUS | Status: AC
  Filled 2014-04-25: qty 8

## 2014-04-25 MED ORDER — DEXTROSE 5 % IV SOLN
56.0000 mg/m2 | Freq: Once | INTRAVENOUS | Status: AC
  Administered 2014-04-25: 118 mg via INTRAVENOUS
  Filled 2014-04-25: qty 59

## 2014-04-25 MED ORDER — ONDANSETRON 8 MG/50ML IVPB (CHCC)
8.0000 mg | Freq: Once | INTRAVENOUS | Status: AC
  Administered 2014-04-25: 8 mg via INTRAVENOUS

## 2014-04-25 MED ORDER — SODIUM CHLORIDE 0.9 % IV SOLN
Freq: Once | INTRAVENOUS | Status: AC
  Administered 2014-04-25: 09:00:00 via INTRAVENOUS

## 2014-04-25 MED ORDER — DEXAMETHASONE SODIUM PHOSPHATE 10 MG/ML IJ SOLN
10.0000 mg | Freq: Once | INTRAMUSCULAR | Status: AC
  Administered 2014-04-25: 10 mg via INTRAVENOUS

## 2014-04-25 MED ORDER — HEPARIN SOD (PORK) LOCK FLUSH 100 UNIT/ML IV SOLN
500.0000 [IU] | Freq: Once | INTRAVENOUS | Status: AC | PRN
  Administered 2014-04-25: 500 [IU]
  Filled 2014-04-25: qty 5

## 2014-04-25 MED ORDER — DEXAMETHASONE SODIUM PHOSPHATE 10 MG/ML IJ SOLN
INTRAMUSCULAR | Status: AC
  Filled 2014-04-25: qty 1

## 2014-04-25 NOTE — Patient Instructions (Signed)
[facility] Discharge Instructions for Patients Receiving Chemotherapy  Today you received the following chemotherapy agents: Kyprolis  To help prevent nausea and vomiting after your treatment, we encourage you to take your nausea medication: as directed.   If you develop nausea and vomiting that is not controlled by your nausea medication, call the clinic.   BELOW ARE SYMPTOMS THAT SHOULD BE REPORTED IMMEDIATELY:  *FEVER GREATER THAN 100.5 F  *CHILLS WITH OR WITHOUT FEVER  NAUSEA AND VOMITING THAT IS NOT CONTROLLED WITH YOUR NAUSEA MEDICATION  *UNUSUAL SHORTNESS OF BREATH  *UNUSUAL BRUISING OR BLEEDING  TENDERNESS IN MOUTH AND THROAT WITH OR WITHOUT PRESENCE OF ULCERS  *URINARY PROBLEMS  *BOWEL PROBLEMS  UNUSUAL RASH Items with * indicate a potential emergency and should be followed up as soon as possible.  Feel free to call the clinic you have any questions or concerns. The clinic phone number is [phone].    

## 2014-04-25 NOTE — Patient Instructions (Signed)
INR above goal today Do not take Coumadin today or tomorrow (Monday and Tuesday). Change your Coumadin dose to 2.5 mg daily (1/2 tab) except 5 mg (1 tablet) on Tuesdays and Fridays. Recheck INR in 1 week on 05/02/14 we will see you in infusion

## 2014-04-25 NOTE — Progress Notes (Signed)
INR above goal today Pt seen in infusion area Pt is doing well today with no complaints other than continued decreased PO intake INR remains about the same as last week despite dose decrease Mr. Roblero states he has taken his coumadin as instructed with no missed or extra doses No medication changes Platelets are low today at 89k and Hgb is stable at 9.4 Pt reports no unusual bleeding or bruising INR likely elevated due to poor PO intake will make another dose decrease Plan: Hold Coumadin today and tomorrow (Monday and Tuesday). Change Coumadin dose to 2.5 mg daily (1/2 tab) except 5 mg (1 tablet) on Tuesdays and Fridays. Recheck INR in 1 week on 05/02/14 we will see pt in infusion

## 2014-04-25 NOTE — Progress Notes (Signed)
Per Dr. Inda Merlin. Platelets count 89. Ok to treat.

## 2014-04-26 ENCOUNTER — Encounter: Payer: Self-pay | Admitting: Radiation Oncology

## 2014-04-26 ENCOUNTER — Ambulatory Visit
Admission: RE | Admit: 2014-04-26 | Discharge: 2014-04-26 | Disposition: A | Discharge status: Home / Self Care | Payer: 59 | Source: Ambulatory Visit | Attending: Radiation Oncology | Admitting: Radiation Oncology

## 2014-04-26 ENCOUNTER — Ambulatory Visit (HOSPITAL_BASED_OUTPATIENT_CLINIC_OR_DEPARTMENT_OTHER): Payer: 59

## 2014-04-26 VITALS — BP 146/79 | HR 78 | Temp 97.6°F | Resp 18

## 2014-04-26 VITALS — BP 157/82 | HR 75 | Temp 98.3°F | Resp 20 | Wt 227.3 lb

## 2014-04-26 DIAGNOSIS — C9000 Multiple myeloma not having achieved remission: Secondary | ICD-10-CM

## 2014-04-26 DIAGNOSIS — Z5112 Encounter for antineoplastic immunotherapy: Secondary | ICD-10-CM

## 2014-04-26 DIAGNOSIS — C9002 Multiple myeloma in relapse: Secondary | ICD-10-CM

## 2014-04-26 MED ORDER — DEXAMETHASONE SODIUM PHOSPHATE 10 MG/ML IJ SOLN
INTRAMUSCULAR | Status: AC
  Filled 2014-04-26: qty 1

## 2014-04-26 MED ORDER — SODIUM CHLORIDE 0.9 % IJ SOLN
10.0000 mL | INTRAMUSCULAR | Status: DC | PRN
  Administered 2014-04-26: 10 mL
  Filled 2014-04-26: qty 10

## 2014-04-26 MED ORDER — ONDANSETRON 8 MG/NS 50 ML IVPB
INTRAVENOUS | Status: AC
  Filled 2014-04-26: qty 8

## 2014-04-26 MED ORDER — HEPARIN SOD (PORK) LOCK FLUSH 100 UNIT/ML IV SOLN
500.0000 [IU] | Freq: Once | INTRAVENOUS | Status: AC | PRN
  Administered 2014-04-26: 500 [IU]
  Filled 2014-04-26: qty 5

## 2014-04-26 MED ORDER — SODIUM CHLORIDE 0.9 % IV SOLN
Freq: Once | INTRAVENOUS | Status: AC
  Administered 2014-04-26: 08:00:00 via INTRAVENOUS

## 2014-04-26 MED ORDER — CARFILZOMIB CHEMO INJECTION 60 MG
56.0000 mg/m2 | Freq: Once | INTRAVENOUS | Status: AC
  Administered 2014-04-26: 118 mg via INTRAVENOUS
  Filled 2014-04-26: qty 59

## 2014-04-26 MED ORDER — DEXAMETHASONE SODIUM PHOSPHATE 10 MG/ML IJ SOLN
10.0000 mg | Freq: Once | INTRAMUSCULAR | Status: AC
  Administered 2014-04-26: 10 mg via INTRAVENOUS

## 2014-04-26 MED ORDER — ONDANSETRON 8 MG/50ML IVPB (CHCC)
8.0000 mg | Freq: Once | INTRAVENOUS | Status: AC
  Administered 2014-04-26: 8 mg via INTRAVENOUS

## 2014-04-26 NOTE — Patient Instructions (Signed)
[facility] Discharge Instructions for Patients Receiving Chemotherapy  Today you received the following chemotherapy agents Kyprolis.  To help prevent nausea and vomiting after your treatment, we encourage you to take your nausea medication.   If you develop nausea and vomiting that is not controlled by your nausea medication, call the clinic.   BELOW ARE SYMPTOMS THAT SHOULD BE REPORTED IMMEDIATELY:  *FEVER GREATER THAN 100.5 F  *CHILLS WITH OR WITHOUT FEVER  NAUSEA AND VOMITING THAT IS NOT CONTROLLED WITH YOUR NAUSEA MEDICATION  *UNUSUAL SHORTNESS OF BREATH  *UNUSUAL BRUISING OR BLEEDING  TENDERNESS IN MOUTH AND THROAT WITH OR WITHOUT PRESENCE OF ULCERS  *URINARY PROBLEMS  *BOWEL PROBLEMS  UNUSUAL RASH Items with * indicate a potential emergency and should be followed up as soon as possible.  Feel free to call the clinic you have any questions or concerns. The clinic phone number is [phone].    

## 2014-04-26 NOTE — Progress Notes (Signed)
  Radiation Oncology         [phone] ________________________________  Name: [name] MRN: [id number]  Date: 04/26/2014  DOB: [birth year]  Weekly Radiation Therapy Management  Current Dose: 17.5 Gy     Planned Dose:  20 Gy  Narrative . . . . . . . . The patient presents for routine under treatment assessment.                                   The patient is without complaint. He denies any pain in his lower back or pelvis area today. He has had some nausea with his treatments and does take medication concerning this issue. He is ambulating a little better at this time. Yesterday he walked for the first time without his walker.                                 Set-up films were reviewed.                                 The chart was checked. Physical Findings. . .  weight is 227 lb 4.8 oz (103.103 kg). His oral temperature is 98.3 F (36.8 C). His blood pressure is 157/82 and his pulse is 75. His respiration is 20. . The lungs are clear. The heart has a regular rhythm and rate. The abdomen is soft and nontender with normal bowel sounds. Impression . . . . . . . The patient is tolerating radiation. Plan . . . . . . . . . . . . Continue treatment as planned.  ________________________________   Blair Promise, PhD, MD

## 2014-04-26 NOTE — Progress Notes (Addendum)
Weekly rad txs, 7 completed L1-S2 spine, patient [name] pain, had nausea yesterday per wife takes nausea medication whech helps, , in w/c,  Eats 2 meals daily, none this am yet, had chicken/rice for dinner last night , no c/o nausea with dinner, jusy finished chemotherapy upstairs today, uses walker at home,  11:02 AM

## 2014-04-27 ENCOUNTER — Ambulatory Visit
Admission: RE | Admit: 2014-04-27 | Discharge: 2014-04-27 | Disposition: A | Discharge status: Home / Self Care | Payer: 59 | Source: Ambulatory Visit | Attending: Radiation Oncology | Admitting: Radiation Oncology

## 2014-04-28 ENCOUNTER — Ambulatory Visit: Payer: 59

## 2014-04-29 ENCOUNTER — Other Ambulatory Visit: Payer: 59

## 2014-04-29 ENCOUNTER — Ambulatory Visit: Payer: 59 | Admitting: Physician Assistant

## 2014-04-29 ENCOUNTER — Ambulatory Visit: Payer: 59

## 2014-05-02 ENCOUNTER — Other Ambulatory Visit (HOSPITAL_BASED_OUTPATIENT_CLINIC_OR_DEPARTMENT_OTHER): Payer: 59

## 2014-05-02 ENCOUNTER — Other Ambulatory Visit: Payer: 59

## 2014-05-02 ENCOUNTER — Ambulatory Visit (HOSPITAL_BASED_OUTPATIENT_CLINIC_OR_DEPARTMENT_OTHER): Payer: 59 | Admitting: Pharmacist

## 2014-05-02 ENCOUNTER — Ambulatory Visit: Payer: 59

## 2014-05-02 DIAGNOSIS — C9002 Multiple myeloma in relapse: Secondary | ICD-10-CM

## 2014-05-02 DIAGNOSIS — I776 Arteritis, unspecified: Secondary | ICD-10-CM

## 2014-05-02 DIAGNOSIS — I82409 Acute embolism and thrombosis of unspecified deep veins of unspecified lower extremity: Secondary | ICD-10-CM

## 2014-05-02 DIAGNOSIS — C9000 Multiple myeloma not having achieved remission: Secondary | ICD-10-CM

## 2014-05-02 LAB — COMPREHENSIVE METABOLIC PANEL (CC13)
ALT: 16 U/L (ref 0–55)
ANION GAP: 12 meq/L — AB (ref 3–11)
AST: 12 U/L (ref 5–34)
Albumin: 2.8 g/dL — ABNORMAL LOW (ref 3.5–5.0)
Alkaline Phosphatase: 107 U/L (ref 40–150)
BUN: 7.3 mg/dL (ref 7.0–26.0)
CALCIUM: 8.6 mg/dL (ref 8.4–10.4)
CHLORIDE: 106 meq/L (ref 98–109)
CO2: 22 meq/L (ref 22–29)
Creatinine: 0.7 mg/dL (ref 0.7–1.3)
GLUCOSE: 96 mg/dL (ref 70–140)
Potassium: 3.2 mEq/L — ABNORMAL LOW (ref 3.5–5.1)
SODIUM: 140 meq/L (ref 136–145)
TOTAL PROTEIN: 5.9 g/dL — AB (ref 6.4–8.3)
Total Bilirubin: 1.33 mg/dL — ABNORMAL HIGH (ref 0.20–1.20)

## 2014-05-02 LAB — PROTIME-INR
INR: 2.2 (ref 2.00–3.50)
Protime: 26.4 Seconds — ABNORMAL HIGH (ref 10.6–13.4)

## 2014-05-02 LAB — POCT INR: INR: 2.2

## 2014-05-02 LAB — CBC WITH DIFFERENTIAL/PLATELET
BASO%: 0.5 % (ref 0.0–2.0)
Basophils Absolute: 0 10*3/uL (ref 0.0–0.1)
EOS%: 5.6 % (ref 0.0–7.0)
Eosinophils Absolute: 0.2 10*3/uL (ref 0.0–0.5)
HCT: 30.1 % — ABNORMAL LOW (ref 38.4–49.9)
HGB: 9.3 g/dL — ABNORMAL LOW (ref 13.0–17.1)
LYMPH%: 12.2 % — ABNORMAL LOW (ref 14.0–49.0)
MCH: 27.8 pg (ref 27.2–33.4)
MCHC: 30.9 g/dL — AB (ref 32.0–36.0)
MCV: 89.9 fL (ref 79.3–98.0)
MONO#: 0.7 10*3/uL (ref 0.1–0.9)
MONO%: 17.8 % — ABNORMAL HIGH (ref 0.0–14.0)
NEUT#: 2.6 10*3/uL (ref 1.5–6.5)
NEUT%: 63.9 % (ref 39.0–75.0)
NRBC: 1 % — AB (ref 0–0)
PLATELETS: 61 10*3/uL — AB (ref 140–400)
RBC: 3.35 10*6/uL — ABNORMAL LOW (ref 4.20–5.82)
RDW: 18.9 % — ABNORMAL HIGH (ref 11.0–14.6)
WBC: 4.1 10*3/uL (ref 4.0–10.3)
lymph#: 0.5 10*3/uL — ABNORMAL LOW (ref 0.9–3.3)

## 2014-05-02 NOTE — Progress Notes (Signed)
Platelet count 61, hold treatment today per Dr. Julien Nordmann.  Spoke to patient in lobby, pt verbalized understanding.  Pt instructed to return next week as scheduled.

## 2014-05-02 NOTE — Progress Notes (Signed)
INR =2.2 today.  H/H=9.3/30.1 Plt = 61,000.    No bleeding or bruising.  Chemo will be held today due to low plts.  Will continue coumadin dose of 5mg  Tu/F and 2.5mg  other days.  Will check PT/INR in 2 weeks with next scheduled chemo.

## 2014-05-03 ENCOUNTER — Ambulatory Visit: Payer: 59

## 2014-05-09 ENCOUNTER — Other Ambulatory Visit: Payer: 59

## 2014-05-09 ENCOUNTER — Other Ambulatory Visit: Payer: Self-pay | Admitting: Internal Medicine

## 2014-05-09 ENCOUNTER — Telehealth: Payer: Self-pay | Admitting: Internal Medicine

## 2014-05-09 ENCOUNTER — Other Ambulatory Visit: Payer: Self-pay | Admitting: *Deleted

## 2014-05-09 DIAGNOSIS — C9002 Multiple myeloma in relapse: Secondary | ICD-10-CM

## 2014-05-09 DIAGNOSIS — C9000 Multiple myeloma not having achieved remission: Secondary | ICD-10-CM

## 2014-05-09 DIAGNOSIS — G629 Polyneuropathy, unspecified: Secondary | ICD-10-CM

## 2014-05-09 DIAGNOSIS — G8918 Other acute postprocedural pain: Secondary | ICD-10-CM

## 2014-05-09 LAB — CBC WITH DIFFERENTIAL/PLATELET
BASO%: 2.5 % — AB (ref 0.0–2.0)
Basophils Absolute: 0.1 10*3/uL (ref 0.0–0.1)
EOS%: 2 % (ref 0.0–7.0)
Eosinophils Absolute: 0.1 10*3/uL (ref 0.0–0.5)
HCT: 28.8 % — ABNORMAL LOW (ref 38.4–49.9)
HGB: 9.1 g/dL — ABNORMAL LOW (ref 13.0–17.1)
LYMPH%: 8.1 % — ABNORMAL LOW (ref 14.0–49.0)
MCH: 28.2 pg (ref 27.2–33.4)
MCHC: 31.7 g/dL — AB (ref 32.0–36.0)
MCV: 89.2 fL (ref 79.3–98.0)
MONO#: 0.6 10*3/uL (ref 0.1–0.9)
MONO%: 22.6 % — AB (ref 0.0–14.0)
NEUT#: 1.6 10*3/uL (ref 1.5–6.5)
NEUT%: 64.8 % (ref 39.0–75.0)
Platelets: 169 10*3/uL (ref 140–400)
RBC: 3.23 10*6/uL — AB (ref 4.20–5.82)
RDW: 20.5 % — AB (ref 11.0–14.6)
WBC: 2.5 10*3/uL — ABNORMAL LOW (ref 4.0–10.3)
lymph#: 0.2 10*3/uL — ABNORMAL LOW (ref 0.9–3.3)

## 2014-05-09 LAB — COMPREHENSIVE METABOLIC PANEL (CC13)
ALBUMIN: 2.7 g/dL — AB (ref 3.5–5.0)
ALT: 14 U/L (ref 0–55)
AST: 18 U/L (ref 5–34)
Alkaline Phosphatase: 124 U/L (ref 40–150)
Anion Gap: 15 mEq/L — ABNORMAL HIGH (ref 3–11)
BUN: 8.1 mg/dL (ref 7.0–26.0)
CO2: 24 mEq/L (ref 22–29)
Calcium: 8.8 mg/dL (ref 8.4–10.4)
Chloride: 100 mEq/L (ref 98–109)
Creatinine: 0.7 mg/dL (ref 0.7–1.3)
Glucose: 85 mg/dl (ref 70–140)
POTASSIUM: 3.1 meq/L — AB (ref 3.5–5.1)
SODIUM: 139 meq/L (ref 136–145)
TOTAL PROTEIN: 6 g/dL — AB (ref 6.4–8.3)
Total Bilirubin: 1.58 mg/dL — ABNORMAL HIGH (ref 0.20–1.20)

## 2014-05-09 MED ORDER — MORPHINE SULFATE ER 60 MG PO TBCR: 60.0000 mg | EXTENDED_RELEASE_TABLET | Freq: Two times a day (BID) | ORAL | Status: DC

## 2014-05-09 MED ORDER — HYDROCODONE-ACETAMINOPHEN 10-325 MG PO TABS: 1.0000 | ORAL_TABLET | Freq: Four times a day (QID) | ORAL | Status: DC | PRN

## 2014-05-09 NOTE — Telephone Encounter (Signed)
Walk in form received after lab appointment requesting refills for MS Contin and Norco.

## 2014-05-09 NOTE — Telephone Encounter (Signed)
gv and printed appt sched for pt. °

## 2014-05-09 NOTE — Progress Notes (Signed)
Quick Note:  Call patient with the result and order K Dur 20 meq po qd x7 days ______ 

## 2014-05-10 ENCOUNTER — Telehealth: Payer: Self-pay | Admitting: *Deleted

## 2014-05-10 ENCOUNTER — Other Ambulatory Visit: Payer: Self-pay | Admitting: *Deleted

## 2014-05-10 ENCOUNTER — Other Ambulatory Visit: Payer: Self-pay | Admitting: Physician Assistant

## 2014-05-10 ENCOUNTER — Telehealth: Payer: Self-pay | Admitting: Internal Medicine

## 2014-05-10 ENCOUNTER — Ambulatory Visit (HOSPITAL_BASED_OUTPATIENT_CLINIC_OR_DEPARTMENT_OTHER): Payer: 59

## 2014-05-10 VITALS — BP 128/56 | HR 75 | Temp 98.0°F | Resp 18

## 2014-05-10 DIAGNOSIS — C9000 Multiple myeloma not having achieved remission: Secondary | ICD-10-CM

## 2014-05-10 DIAGNOSIS — E860 Dehydration: Secondary | ICD-10-CM

## 2014-05-10 DIAGNOSIS — R630 Anorexia: Secondary | ICD-10-CM

## 2014-05-10 DIAGNOSIS — E876 Hypokalemia: Secondary | ICD-10-CM

## 2014-05-10 MED ORDER — HEPARIN SOD (PORK) LOCK FLUSH 100 UNIT/ML IV SOLN
500.0000 [IU] | Freq: Once | INTRAVENOUS | Status: AC
  Administered 2014-05-10: 500 [IU] via INTRAVENOUS
  Filled 2014-05-10: qty 5

## 2014-05-10 MED ORDER — SODIUM CHLORIDE 0.9 % IV SOLN
Freq: Once | INTRAVENOUS | Status: DC
  Administered 2014-05-10: 14:00:00 via INTRAVENOUS

## 2014-05-10 MED ORDER — POTASSIUM CHLORIDE CRYS ER 20 MEQ PO TBCR: 20.0000 meq | EXTENDED_RELEASE_TABLET | Freq: Every day | ORAL | Status: DC

## 2014-05-10 MED ORDER — SODIUM CHLORIDE 0.9 % IJ SOLN
10.0000 mL | INTRAMUSCULAR | Status: DC | PRN
  Administered 2014-05-10: 10 mL via INTRAVENOUS
  Filled 2014-05-10: qty 10

## 2014-05-10 NOTE — Telephone Encounter (Signed)
Sent prescription for potassium to pharmacy.

## 2014-05-10 NOTE — Patient Instructions (Signed)
Dehydration, Adult Dehydration is when you lose more fluids from the body than you take in. Vital organs like the kidneys, brain, and heart cannot function without a proper amount of fluids and salt. Any loss of fluids from the body can cause dehydration.  CAUSES   Vomiting.  Diarrhea.  Excessive sweating.  Excessive urine output.  Fever. SYMPTOMS  Mild dehydration  Thirst.  Dry lips.  Slightly dry mouth. Moderate dehydration  Very dry mouth.  Sunken eyes.  Skin does not bounce back quickly when lightly pinched and released.  Dark urine and decreased urine production.  Decreased tear production.  Headache. Severe dehydration  Very dry mouth.  Extreme thirst.  Rapid, weak pulse (more than 100 beats per minute at rest).  Cold hands and feet.  Not able to sweat in spite of heat and temperature.  Rapid breathing.  Blue lips.  Confusion and lethargy.  Difficulty being awakened.  Minimal urine production.  No tears. DIAGNOSIS  Your caregiver will diagnose dehydration based on your symptoms and your exam. Blood and urine tests will help confirm the diagnosis. The diagnostic evaluation should also identify the cause of dehydration. TREATMENT  Treatment of mild or moderate dehydration can often be done at home by increasing the amount of fluids that you drink. It is best to drink small amounts of fluid more often. Drinking too much at one time can make vomiting worse. Refer to the home care instructions below. Severe dehydration needs to be treated at the hospital where you will probably be given intravenous (IV) fluids that contain water and electrolytes. HOME CARE INSTRUCTIONS   Ask your caregiver about specific rehydration instructions.  Drink enough fluids to keep your urine clear or pale yellow.  Drink small amounts frequently if you have nausea and vomiting.  Eat as you normally do.  Avoid:  Foods or drinks high in sugar.  Carbonated  drinks.  Juice.  Extremely hot or cold fluids.  Drinks with caffeine.  Fatty, greasy foods.  Alcohol.  Tobacco.  Overeating.  Gelatin desserts.  Wash your hands well to avoid spreading bacteria and viruses.  Only take over-the-counter or prescription medicines for pain, discomfort, or fever as directed by your caregiver.  Ask your caregiver if you should continue all prescribed and over-the-counter medicines.  Keep all follow-up appointments with your caregiver. SEEK MEDICAL CARE IF:  You have abdominal pain and it increases or stays in one area (localizes).  You have a rash, stiff neck, or severe headache.  You are irritable, sleepy, or difficult to awaken.  You are weak, dizzy, or extremely thirsty. SEEK IMMEDIATE MEDICAL CARE IF:   You are unable to keep fluids down or you get worse despite treatment.  You have frequent episodes of vomiting or diarrhea.  You have blood or green matter (bile) in your vomit.  You have blood in your stool or your stool looks black and tarry.  You have not urinated in 6 to 8 hours, or you have only urinated a small amount of very dark urine.  You have a fever.  You faint. MAKE SURE YOU:   Understand these instructions.  Will watch your condition.  Will get help right away if you are not doing well or get worse. Document Released: 11/04/2005 Document Revised: 01/27/2012 Document Reviewed: 06/24/2011 Exit[employer] Patient Information 2015 [employer]. This information is not intended to replace advice given to you by your health care provider. Make sure you discuss any questions you have with your health care   provider.  

## 2014-05-10 NOTE — Telephone Encounter (Signed)
s.w.l pt wife and advised on time changed on 6.29...ok and aware

## 2014-05-10 NOTE — Telephone Encounter (Addendum)
Called to inform patient of low potassium.  Spoke to patient's wife Otila Kluver), she stated that patient has not been eating or drinking in the past few days.  Informed Dr. Julien Nordmann. Dr. Julien Nordmann stated that we could have patient come in today for IV fluids.  Informed patient and  patient's wife that we could work patient in at 1:15 for IV fluids.  Patient and patient's wife verbalized understanding.

## 2014-05-10 NOTE — Telephone Encounter (Signed)
Message copied by Wardell Heath on Tue May 10, 2014  9:16 AM ------      Message from: Britt Bottom      Created: Tue May 10, 2014  8:47 AM                   ----- Message -----         From: Curt Bears, MD         Sent: 05/09/2014   5:57 PM           To: Carlton Adam, PA-C, #            Call patient with the result and order KDur 20 meq po qd x 7 days ------

## 2014-05-16 ENCOUNTER — Telehealth: Payer: Self-pay | Admitting: Internal Medicine

## 2014-05-16 ENCOUNTER — Other Ambulatory Visit: Payer: Self-pay | Admitting: *Deleted

## 2014-05-16 ENCOUNTER — Encounter: Payer: Self-pay | Admitting: Internal Medicine

## 2014-05-16 ENCOUNTER — Other Ambulatory Visit: Payer: 59

## 2014-05-16 ENCOUNTER — Ambulatory Visit: Payer: 59

## 2014-05-16 ENCOUNTER — Ambulatory Visit: Payer: 59 | Admitting: Pharmacist

## 2014-05-16 ENCOUNTER — Other Ambulatory Visit (HOSPITAL_BASED_OUTPATIENT_CLINIC_OR_DEPARTMENT_OTHER): Payer: 59

## 2014-05-16 ENCOUNTER — Ambulatory Visit (HOSPITAL_BASED_OUTPATIENT_CLINIC_OR_DEPARTMENT_OTHER): Payer: 59 | Admitting: Internal Medicine

## 2014-05-16 ENCOUNTER — Ambulatory Visit (HOSPITAL_BASED_OUTPATIENT_CLINIC_OR_DEPARTMENT_OTHER): Payer: 59

## 2014-05-16 ENCOUNTER — Ambulatory Visit: Payer: 59 | Admitting: Internal Medicine

## 2014-05-16 ENCOUNTER — Telehealth: Payer: Self-pay | Admitting: *Deleted

## 2014-05-16 VITALS — BP 121/55 | HR 83 | Temp 98.3°F | Resp 19 | Ht 63.0 in | Wt 210.1 lb

## 2014-05-16 DIAGNOSIS — C9000 Multiple myeloma not having achieved remission: Secondary | ICD-10-CM

## 2014-05-16 DIAGNOSIS — R5381 Other malaise: Secondary | ICD-10-CM

## 2014-05-16 DIAGNOSIS — M949 Disorder of cartilage, unspecified: Secondary | ICD-10-CM

## 2014-05-16 DIAGNOSIS — I82409 Acute embolism and thrombosis of unspecified deep veins of unspecified lower extremity: Secondary | ICD-10-CM

## 2014-05-16 DIAGNOSIS — N289 Disorder of kidney and ureter, unspecified: Secondary | ICD-10-CM

## 2014-05-16 DIAGNOSIS — R630 Anorexia: Secondary | ICD-10-CM

## 2014-05-16 DIAGNOSIS — R5383 Other fatigue: Secondary | ICD-10-CM

## 2014-05-16 DIAGNOSIS — C9002 Multiple myeloma in relapse: Secondary | ICD-10-CM

## 2014-05-16 DIAGNOSIS — I776 Arteritis, unspecified: Secondary | ICD-10-CM

## 2014-05-16 DIAGNOSIS — M899 Disorder of bone, unspecified: Secondary | ICD-10-CM

## 2014-05-16 DIAGNOSIS — Z7901 Long term (current) use of anticoagulants: Secondary | ICD-10-CM

## 2014-05-16 LAB — CBC WITH DIFFERENTIAL/PLATELET
BASO%: 2.4 % — ABNORMAL HIGH (ref 0.0–2.0)
BASOS ABS: 0.2 10*3/uL — AB (ref 0.0–0.1)
EOS%: 1 % (ref 0.0–7.0)
Eosinophils Absolute: 0.1 10*3/uL (ref 0.0–0.5)
HEMATOCRIT: 32.7 % — AB (ref 38.4–49.9)
HGB: 9.9 g/dL — ABNORMAL LOW (ref 13.0–17.1)
LYMPH#: 1.4 10*3/uL (ref 0.9–3.3)
LYMPH%: 22.9 % (ref 14.0–49.0)
MCH: 27.7 pg (ref 27.2–33.4)
MCHC: 30.3 g/dL — ABNORMAL LOW (ref 32.0–36.0)
MCV: 91.6 fL (ref 79.3–98.0)
MONO#: 1 10*3/uL — AB (ref 0.1–0.9)
MONO%: 15.9 % — ABNORMAL HIGH (ref 0.0–14.0)
NEUT%: 57.8 % (ref 39.0–75.0)
NEUTROS ABS: 3.7 10*3/uL (ref 1.5–6.5)
Platelets: 184 10*3/uL (ref 140–400)
RBC: 3.57 10*6/uL — ABNORMAL LOW (ref 4.20–5.82)
RDW: 19.6 % — ABNORMAL HIGH (ref 11.0–14.6)
WBC: 6.3 10*3/uL (ref 4.0–10.3)

## 2014-05-16 LAB — LACTATE DEHYDROGENASE (CC13): LDH: 1148 U/L — ABNORMAL HIGH (ref 125–245)

## 2014-05-16 LAB — COMPREHENSIVE METABOLIC PANEL (CC13)
ALT: 11 U/L (ref 0–55)
ANION GAP: 22 meq/L — AB (ref 3–11)
AST: 38 U/L — AB (ref 5–34)
Albumin: 2.8 g/dL — ABNORMAL LOW (ref 3.5–5.0)
Alkaline Phosphatase: 149 U/L (ref 40–150)
BUN: 15.2 mg/dL (ref 7.0–26.0)
CHLORIDE: 102 meq/L (ref 98–109)
CO2: 18 meq/L — AB (ref 22–29)
Calcium: 8.7 mg/dL (ref 8.4–10.4)
Creatinine: 2.9 mg/dL — ABNORMAL HIGH (ref 0.7–1.3)
Glucose: 72 mg/dl (ref 70–140)
Potassium: 3.5 mEq/L (ref 3.5–5.1)
SODIUM: 142 meq/L (ref 136–145)
TOTAL PROTEIN: 5.9 g/dL — AB (ref 6.4–8.3)
Total Bilirubin: 0.96 mg/dL (ref 0.20–1.20)

## 2014-05-16 LAB — PROTIME-INR

## 2014-05-16 LAB — POCT INR: INR: >10

## 2014-05-16 LAB — PROTHROMBIN TIME
INR: >10 (ref <1.50)
Prothrombin Time: >90 seconds — ABNORMAL HIGH (ref 11.6–15.2)

## 2014-05-16 LAB — TECHNOLOGIST REVIEW

## 2014-05-16 MED ORDER — SODIUM CHLORIDE 0.9 % IJ SOLN
10.0000 mL | INTRAMUSCULAR | Status: DC | PRN
Start: 2014-05-16 — End: 2014-05-16
  Administered 2014-05-16: 10 mL via INTRAVENOUS
  Filled 2014-05-16: qty 10

## 2014-05-16 MED ORDER — HEPARIN SOD (PORK) LOCK FLUSH 100 UNIT/ML IV SOLN
500.0000 [IU] | Freq: Once | INTRAVENOUS | Status: AC
  Administered 2014-05-16: 500 [IU] via INTRAVENOUS
  Filled 2014-05-16: qty 5

## 2014-05-16 MED ORDER — SODIUM CHLORIDE 0.9 % IV SOLN
Freq: Once | INTRAVENOUS | Status: AC
  Administered 2014-05-16: 10:00:00 via INTRAVENOUS

## 2014-05-16 MED ORDER — PHYTONADIONE 5 MG PO TABS
5.0000 mg | ORAL_TABLET | Freq: Once | ORAL | Status: AC
  Administered 2014-05-16: 5 mg via ORAL
  Filled 2014-05-16: qty 1

## 2014-05-16 NOTE — Telephone Encounter (Signed)
Gave pt appt for lab,md chemo for july and sent pt to labs today

## 2014-05-16 NOTE — Telephone Encounter (Signed)
I have adjusted 7/6 appt and scheduled others   jmw

## 2014-05-16 NOTE — Patient Instructions (Signed)

## 2014-05-16 NOTE — Progress Notes (Signed)
INR > 10         Goal 2-3 No bleeding or bruising noted. Patient has not been eating at all, he has lost 14 pounds in the last 4 weeks. His wife states that he is somewhat confused this morning. I spoke with Dr. Julien Nordmann. [name] will receive vitamin K 5 mg p.o. in the infusion area today. He will not take any Coumadin today or tomorrow. He will return on Wednesday 05/18/14 for lab at 11:00 and Coumadin clinic at 11:15. His wife will call if he has any bleeding in the interim.  [name], PharmD

## 2014-05-16 NOTE — Progress Notes (Signed)
Per Dr Vista Mink, pt needs to come in for IVF 6/30 and 7/1.  Called and left messages with wife cell phone and pt cell phone and asked for call back to confirm they received appts.  SLJ

## 2014-05-16 NOTE — Progress Notes (Signed)
[place] [phone]   [phone]  OFFICE VISIT PROGRESS NOTE  [name]., MD [place] [address]  Principle Diagnosis:  #1 recurrent multiple myeloma IgG kappa subtype diagnosed in June of 2008  #2 history of vasculitis and thrombosis of skin lesions   Prior Therapy:  #1 status post palliative radiotherapy to the left hip under the care of Dr. Sondra Come  #2 status post 5 cycles of systemic chemotherapy with Revlimid and low dose Decadron. Last dose given June 2009 with good response.  #3 status post autologous peripheral blood stem cell transplant at [facility] on 02/02/2008.  #4 the patient had evidence for disease recurrence in December 2010.  #5 Revlimid 25 mg by mouth daily for 21 days every 4 weeks in addition to Decadron 40 mg orally on a weekly basis. The patient is status post 28 cycles, discontinued today secondary to disease progression.  #6 Systemic chemotherapy with Velcade 1.3 mg/M2 on days 1, 4, 8 and 11 in addition to Doxil 30 mg/M2 on day 4 and Decadron 40 mg by mouth on weekly basis every 3 weeks. Status post 3 cycles, last dose was given 05/04/2012 discontinued secondary to intolerance.  #7 Salvage therapy treatment with the Pomalyst 4 mg by mouth daily for 21 days every 28 days as well as Cytoxan 50 mg by mouth every other day and dexamethasone 40 mg by mouth once weekly. Therapy beginning 09/25/2012, status post 2 cycles discontinued secondary to disease progression and intolerance.  #8 Kyprolis (Carfilzomib) 27 mg/M2 on days 1, 2, 8, 9, 15 and 16 every 4 weeks. First dose on 02/08/2013. This would be concurrent with the dexamethasone 40 mg by mouth on a weekly basis. Status post 3 cycles with disease progression.  #9 Systemic chemotherapy with Carfilzomib 27 mg/M2 days 1,2 , 8, 9, 15 and 16, Cytoxan 300 mg/M2 and dexamethasone 40 mg by mouth weekly every 4 weeks. Status post 4 cycles. Last dose  was given 08/09/2013 discontinued today secondary to disease progression.  Current therapy:  1) Systemic chemotherapy with Carfilzomib 56 mg/M2 days 1,2 , 8, 9, 15 and 16, Pomalyst 3 mg by mouth daily for 21 days every 4 weeks and dexamethasone 40 mg by mouth weekly every 4 weeks. First cycle started on 10/05/2013. The patient missed day 8 and 9 of the first cycle secondary to thrombocytopenia. Status post 8 cycles.  2) Zometa 4 mg IV given every 3 months.    INTERVAL HISTORY: [name] 53 y.o. male returns to the clinic today for a work in visit accompanied by his wif. The patient had lack of appetite as well as weight loss of around 15 pounds in the last few weeks. He eats only small portions of his meals every day.  He tolerated the last cycle of his treatment with Carfilzomib, Pomalyst and dexamethasone fairly well except for increasing fatigue. He does not take his medication as prescribed and he missed the Decadron dose for last week. He denies any fever, chills, diarrhea or constipation. He has no chest pain, shortness of breath, cough or hemoptysis. The patient denied having any nausea or vomiting. He has no weight loss or night sweats. He is here today to start cycle #9 of his treatment.  MEDICAL HISTORY: Past Medical History  Diagnosis Date  . Multiple myeloma 09/24/2011  . Hypertension   . History of radiation therapy 07/09/2007-07/30/07    3000 cGy to left pelvis and  proximal femur    ALLERGIES:  is allergic to red dye and other.  MEDICATIONS:  Current Outpatient Prescriptions  Medication Sig Dispense Refill  . benzonatate (TESSALON) 200 MG capsule Take 1 capsule (200 mg total) by mouth 3 (three) times daily as needed for cough.  20 capsule  0  . chlorpheniramine-HYDROcodone (TUSSIONEX) 10-8 MG/5ML LQCR Take 5 mLs by mouth every 12 (twelve) hours as needed for cough.  115 mL  0  . cloNIDine (CATAPRES) 0.2 MG tablet Take 0.2 mg by mouth 3 (three) times daily.      [name]  dexamethasone (DECADRON) 4 MG tablet Take 10 tablets (40 mg total) by mouth once a week. Mondays with chemo  40 tablet  2  . furosemide (LASIX) 20 MG tablet Take 63m (1 tablet) daily, alternating with 480m(2 tablets)  every other day.  45 tablet  0  . gabapentin (NEURONTIN) 300 MG capsule Take 1 capsule (300 mg total) by mouth 3 (three) times daily.  90 capsule  2  . hyaluronate sodium (RADIAPLEXRX) GEL Apply 1 application topically 2 (two) times daily.      . Marland KitchenYDROcodone-acetaminophen (NORCO) 10-325 MG per tablet Take 1-2 tablets by mouth every 6 (six) hours as needed. For pain.  40 tablet  0  . lidocaine-prilocaine (EMLA) cream Apply 1 application topically as needed.  30 g  0  . morphine (MS CONTIN) 60 MG 12 hr tablet Take 1 tablet (60 mg total) by mouth every 12 (twelve) hours.  60 tablet  0  . Nebivolol HCl (BYSTOLIC) 20 MG TABS Take 30 mg by mouth daily.      . Marland Kitchenystatin (MYCOSTATIN) 100000 UNIT/ML suspension Take 5 mLs (500,000 Units total) by mouth 4 (four) times daily.  240 mL  0  . omeprazole (PRILOSEC) 40 MG capsule Take 40 mg by mouth daily as needed (for heartburn).       . Marland KitchenxyCODONE-acetaminophen (PERCOCET/ROXICET) 5-325 MG per tablet Take 1 tablet by mouth every 6 (six) hours as needed for moderate pain.      . pomalidomide (POMALYST) 3 MG capsule Take 1 capsule (3 mg total) by mouth daily. Take with water on days 1-21. Repeat every 28 days.  21 capsule  0  . potassium chloride SA (K-DUR,KLOR-CON) 20 MEQ tablet Take 1 tablet (20 mEq total) by mouth daily. For 7 days  7 tablet  0  . PRESCRIPTION MEDICATION [facility]    . prochlorperazine (COMPAZINE) 10 MG tablet Take 10 mg by mouth every 6 (six) hours as needed for nausea.      . Marland Kitchenarfarin (COUMADIN) 5 MG tablet Take 5 mg by mouth daily except 7.5 mg on Mondays, or as directed  50 tablet  1   No current facility-administered medications for this visit.   Facility-Administered  Medications Ordered in Other Visits  Medication Dose Route Frequency Provider Last Rate Last Dose  . 0.9 %  sodium chloride infusion   Intravenous Once Adrena E Johnson, PA-C      . sodium chloride 0.9 % injection 10 mL  10 mL Intracatheter PRN MoCurt BearsMD   10 mL at 07/27/13 1617  . sodium chloride 0.9 % injection 10 mL  10 mL Intracatheter PRN AdCarlton AdamPA-C        SURGICAL HISTORY:  Past Surgical History  Procedure Laterality Date  . Video bronchoscopy  12/11/2012    Procedure: VIDEO BRONCHOSCOPY;  Surgeon: PeTharon Aquasrigt,  MD;  Location: [facility];  Service: Thoracic;  Laterality: N/A;  . Video assisted thoracoscopy  12/11/2012    Procedure: VIDEO ASSISTED THORACOSCOPY;  Surgeon: Ivin Poot, MD;  Location: [facility];  Service: Thoracic;  Laterality: Right;  . Decortication  12/11/2012    Procedure: DECORTICATION;  Surgeon: Ivin Poot, MD;  Location: [facility] OR;  Service: Thoracic;  Laterality: N/A;    REVIEW OF SYSTEMS:  Constitutional: positive for anorexia, fatigue and weight loss Eyes: negative Ears, nose, mouth, throat, and face: negative Respiratory: negative Cardiovascular: negative Gastrointestinal: negative Genitourinary:negative Integument/breast: negative Hematologic/lymphatic: negative Musculoskeletal:positive for muscle weakness Neurological: negative Behavioral/Psych: negative Endocrine: negative Allergic/Immunologic: negative   PHYSICAL EXAMINATION: General appearance: alert, cooperative, fatigued and no distress Head: Normocephalic, without obvious abnormality, atraumatic, Nontender Neck: no adenopathy, no JVD, supple, symmetrical, trachea midline and thyroid not enlarged, symmetric, no tenderness/mass/nodules Lymph nodes: Cervical, supraclavicular, and axillary nodes normal. Resp: clear to auscultation bilaterally Back: symmetric, no curvature. ROM normal. No CVA tenderness. Cardio: regular rate and rhythm, S1, S2 normal, no murmur, click, rub or  gallop GI: soft, non-tender; bowel sounds normal; no masses,  no organomegaly Extremities: edema trace - 1+ pitting edema bilateral lower extremities Neurologic: Alert and oriented X 3, normal strength and tone. Normal symmetric reflexes. Normal coordination and gait  ECOG PERFORMANCE STATUS: 2 - Symptomatic, <50% confined to bed  Blood pressure 121/55, pulse 83, temperature 98.3 F (36.8 C), resp. rate 19, height _0  (1.6 m), weight 210 lb 1.6 oz (95.301 kg).  LABORATORY DATA: Lab Results  Component Value Date   WBC 6.3 05/16/2014   HGB 9.9* 05/16/2014   HCT 32.7* 05/16/2014   MCV 91.6 05/16/2014   PLT 184 05/16/2014      Chemistry      Component Value Date/Time   NA 139 05/09/2014 0812   NA 136 03/16/2013 1930   K 3.1* 05/09/2014 0812   K 3.8 03/16/2013 1930   CL 112* 05/10/2013 0849   CL 110 03/16/2013 1930   CO2 24 05/09/2014 0812   CO2 16* 03/16/2013 1930   BUN 8.1 05/09/2014 0812   BUN 22 03/16/2013 1930   CREATININE 0.7 05/09/2014 0812   CREATININE 1.24 03/16/2013 1930      Component Value Date/Time   CALCIUM 8.8 05/09/2014 0812   CALCIUM 8.2* 03/16/2013 1930   ALKPHOS 124 05/09/2014 0812   ALKPHOS 31* 03/16/2013 1930   AST 18 05/09/2014 0812   AST 11 03/16/2013 1930   ALT 14 05/09/2014 0812   ALT 15 03/16/2013 1930   BILITOT 1.58* 05/09/2014 0812   BILITOT 0.2* 03/16/2013 1930       ASSESSMENT AND PLAN:   1) relapsed multiple myeloma: The patient is a very pleasant 53 years old African American male with recurrent multiple myeloma currently on treatment with Carfilzomib 56 mg/M2, Pomalyst 3 mg by mouth daily for 21 days every 3 weeks in addition to Decadron 40 mg on a weekly basis. He is status post 8 cycles.   The patient is tolerating his treatment fairly well with no significant adverse effects except for the generalized fatigue and bone pain.  He has no appetite recently and lost 14 pounds. I recommended for the patient to delay his treatment with cycle #9 until next  week. I will order myeloma panel today for evaluation of his disease before starting cycle #9. 2) Renal insufficiency likely secondary to dehydration and poor by mouth intake: I will arrange for the patient to receive IV  hydration with normal saline and probably the next 2 days 3) I strongly advise him to take his Decadron dose on a weekly basis as scheduled and this may help his appetite. He would come back for followup visit in one week for evaluation and discussion of his lab results. He was advised to call immediately if he has any concerning symptoms in the interval.  He was advised to call immediately if she has any concerning symptoms in the interval. The patient voices understanding of current disease status and treatment options and is in agreement with the current care plan.  All questions were answered. The patient knows to call the clinic with any problems, questions or concerns. We can certainly see the patient much sooner if necessary. I had a 15 minutes of face-to-face counseling with the patient and his wife out of the total visit time of 25 minutes.  Disclaimer: This note was dictated with voice recognition software. Similar sounding words can inadvertently be transcribed and may not be corrected upon review.   Eilleen Kempf., MD 05/16/2014

## 2014-05-16 NOTE — Progress Notes (Signed)
1150-Pt.'s wife verbalizes an understanding to return to [facility] on 05/18/14 for Coumadin clinic at 11:00AM.

## 2014-05-17 ENCOUNTER — Inpatient Hospital Stay (HOSPITAL_COMMUNITY): Payer: 59

## 2014-05-17 ENCOUNTER — Other Ambulatory Visit: Payer: Self-pay

## 2014-05-17 ENCOUNTER — Emergency Department (HOSPITAL_COMMUNITY): Payer: 59

## 2014-05-17 ENCOUNTER — Telehealth: Payer: Self-pay | Admitting: *Deleted

## 2014-05-17 ENCOUNTER — Inpatient Hospital Stay (HOSPITAL_COMMUNITY)
Admission: EM | Admit: 2014-05-17 | Discharge: 2014-05-20 | DRG: 682 | Disposition: A | Discharge status: Hospice | Payer: 59 | Attending: Internal Medicine | Admitting: Internal Medicine

## 2014-05-17 ENCOUNTER — Encounter (HOSPITAL_COMMUNITY): Payer: Self-pay | Admitting: Emergency Medicine

## 2014-05-17 ENCOUNTER — Ambulatory Visit: Payer: 59

## 2014-05-17 DIAGNOSIS — C9000 Multiple myeloma not having achieved remission: Secondary | ICD-10-CM | POA: Diagnosis present

## 2014-05-17 DIAGNOSIS — E873 Alkalosis: Secondary | ICD-10-CM | POA: Diagnosis present

## 2014-05-17 DIAGNOSIS — N170 Acute kidney failure with tubular necrosis: Secondary | ICD-10-CM

## 2014-05-17 DIAGNOSIS — Z9889 Other specified postprocedural states: Secondary | ICD-10-CM

## 2014-05-17 DIAGNOSIS — E876 Hypokalemia: Secondary | ICD-10-CM | POA: Diagnosis not present

## 2014-05-17 DIAGNOSIS — N3000 Acute cystitis without hematuria: Secondary | ICD-10-CM | POA: Diagnosis present

## 2014-05-17 DIAGNOSIS — J90 Pleural effusion, not elsewhere classified: Secondary | ICD-10-CM

## 2014-05-17 DIAGNOSIS — I129 Hypertensive chronic kidney disease with stage 1 through stage 4 chronic kidney disease, or unspecified chronic kidney disease: Secondary | ICD-10-CM | POA: Diagnosis present

## 2014-05-17 DIAGNOSIS — R319 Hematuria, unspecified: Secondary | ICD-10-CM | POA: Diagnosis not present

## 2014-05-17 DIAGNOSIS — G8918 Other acute postprocedural pain: Secondary | ICD-10-CM

## 2014-05-17 DIAGNOSIS — E872 Acidosis, unspecified: Secondary | ICD-10-CM | POA: Diagnosis present

## 2014-05-17 DIAGNOSIS — E870 Hyperosmolality and hypernatremia: Secondary | ICD-10-CM | POA: Diagnosis present

## 2014-05-17 DIAGNOSIS — R7881 Bacteremia: Secondary | ICD-10-CM

## 2014-05-17 DIAGNOSIS — G8929 Other chronic pain: Secondary | ICD-10-CM | POA: Diagnosis present

## 2014-05-17 DIAGNOSIS — Z9221 Personal history of antineoplastic chemotherapy: Secondary | ICD-10-CM

## 2014-05-17 DIAGNOSIS — G9340 Encephalopathy, unspecified: Secondary | ICD-10-CM | POA: Diagnosis present

## 2014-05-17 DIAGNOSIS — R7309 Other abnormal glucose: Secondary | ICD-10-CM | POA: Diagnosis present

## 2014-05-17 DIAGNOSIS — T380X5A Adverse effect of glucocorticoids and synthetic analogues, initial encounter: Secondary | ICD-10-CM | POA: Diagnosis not present

## 2014-05-17 DIAGNOSIS — Z923 Personal history of irradiation: Secondary | ICD-10-CM

## 2014-05-17 DIAGNOSIS — N179 Acute kidney failure, unspecified: Principal | ICD-10-CM | POA: Diagnosis present

## 2014-05-17 DIAGNOSIS — T45515A Adverse effect of anticoagulants, initial encounter: Secondary | ICD-10-CM

## 2014-05-17 DIAGNOSIS — Z6836 Body mass index (BMI) 36.0-36.9, adult: Secondary | ICD-10-CM

## 2014-05-17 DIAGNOSIS — D6832 Hemorrhagic disorder due to extrinsic circulating anticoagulants: Secondary | ICD-10-CM

## 2014-05-17 DIAGNOSIS — N058 Unspecified nephritic syndrome with other morphologic changes: Secondary | ICD-10-CM | POA: Diagnosis present

## 2014-05-17 DIAGNOSIS — I10 Essential (primary) hypertension: Secondary | ICD-10-CM

## 2014-05-17 DIAGNOSIS — D649 Anemia, unspecified: Secondary | ICD-10-CM | POA: Diagnosis present

## 2014-05-17 DIAGNOSIS — Z7401 Bed confinement status: Secondary | ICD-10-CM

## 2014-05-17 DIAGNOSIS — Z79899 Other long term (current) drug therapy: Secondary | ICD-10-CM

## 2014-05-17 DIAGNOSIS — N189 Chronic kidney disease, unspecified: Secondary | ICD-10-CM | POA: Diagnosis present

## 2014-05-17 DIAGNOSIS — N4889 Other specified disorders of penis: Secondary | ICD-10-CM

## 2014-05-17 DIAGNOSIS — Z9484 Stem cells transplant status: Secondary | ICD-10-CM

## 2014-05-17 DIAGNOSIS — I776 Arteritis, unspecified: Secondary | ICD-10-CM

## 2014-05-17 DIAGNOSIS — G629 Polyneuropathy, unspecified: Secondary | ICD-10-CM

## 2014-05-17 DIAGNOSIS — Z66 Do not resuscitate: Secondary | ICD-10-CM | POA: Diagnosis present

## 2014-05-17 DIAGNOSIS — J869 Pyothorax without fistula: Secondary | ICD-10-CM

## 2014-05-17 DIAGNOSIS — E669 Obesity, unspecified: Secondary | ICD-10-CM | POA: Diagnosis present

## 2014-05-17 DIAGNOSIS — Z515 Encounter for palliative care: Secondary | ICD-10-CM

## 2014-05-17 DIAGNOSIS — Z7901 Long term (current) use of anticoagulants: Secondary | ICD-10-CM

## 2014-05-17 LAB — CBC WITH DIFFERENTIAL/PLATELET
BASOS PCT: 1 % (ref 0–1)
Basophils Absolute: 0.1 10*3/uL (ref 0.0–0.1)
EOS PCT: 0 % (ref 0–5)
Eosinophils Absolute: 0 10*3/uL (ref 0.0–0.7)
HCT: 29.6 % — ABNORMAL LOW (ref 39.0–52.0)
HEMOGLOBIN: 9.1 g/dL — AB (ref 13.0–17.0)
Lymphocytes Relative: 12 % (ref 12–46)
Lymphs Abs: 0.8 10*3/uL (ref 0.7–4.0)
MCH: 28.1 pg (ref 26.0–34.0)
MCHC: 30.7 g/dL (ref 30.0–36.0)
MCV: 91.4 fL (ref 78.0–100.0)
Monocytes Absolute: 0.6 10*3/uL (ref 0.1–1.0)
Monocytes Relative: 8 % (ref 3–12)
NEUTROS PCT: 79 % — AB (ref 43–77)
Neutro Abs: 5.4 10*3/uL (ref 1.7–7.7)
Platelets: 247 10*3/uL (ref 150–400)
RBC: 3.24 MIL/uL — AB (ref 4.22–5.81)
RDW: 20.1 % — ABNORMAL HIGH (ref 11.5–15.5)
WBC: 6.9 10*3/uL (ref 4.0–10.5)

## 2014-05-17 LAB — BLOOD GAS, ARTERIAL
Acid-base deficit: 17 mmol/L — ABNORMAL HIGH (ref 0.0–2.0)
Acid-base deficit: 14.2 mmol/L — ABNORMAL HIGH (ref 0.0–2.0)
BICARBONATE: 9.9 meq/L — AB (ref 20.0–24.0)
Bicarbonate: 7.7 mEq/L — ABNORMAL LOW (ref 20.0–24.0)
DRAWN BY: 295031
Drawn by: 295031
FIO2: 0.21 %
FIO2: 0.21 %
O2 SAT: 96.9 %
O2 SAT: 82.7 %
PATIENT TEMPERATURE: 98.6
PCO2 ART: 18.9 mmHg — AB (ref 35.0–45.0)
PO2 ART: 55.1 mmHg — AB (ref 80.0–100.0)
Patient temperature: 98.6
TCO2: 7.4 mmol/L (ref 0–100)
TCO2: 9.3 mmol/L (ref 0–100)
pCO2 arterial: 15.5 mmHg — CL (ref 35.0–45.0)
pH, Arterial: 7.319 — ABNORMAL LOW (ref 7.350–7.450)
pH, Arterial: 7.339 — ABNORMAL LOW (ref 7.350–7.450)
pO2, Arterial: 107 mmHg — ABNORMAL HIGH (ref 80.0–100.0)

## 2014-05-17 LAB — I-STAT CHEM 8, ED
BUN: 30 mg/dL — ABNORMAL HIGH (ref 6–23)
CHLORIDE: 106 meq/L (ref 96–112)
Calcium, Ion: 0.98 mmol/L — ABNORMAL LOW (ref 1.12–1.23)
Creatinine, Ser: 5.5 mg/dL — ABNORMAL HIGH (ref 0.50–1.35)
GLUCOSE: 96 mg/dL (ref 70–99)
HEMATOCRIT: 39 % (ref 39.0–52.0)
Hemoglobin: 13.3 g/dL (ref 13.0–17.0)
Potassium: 4.3 mEq/L (ref 3.7–5.3)
Sodium: 140 mEq/L (ref 137–147)
TCO2: 9 mmol/L (ref 0–100)

## 2014-05-17 LAB — CBC
HEMATOCRIT: 25.9 % — AB (ref 39.0–52.0)
HEMOGLOBIN: 8 g/dL — AB (ref 13.0–17.0)
MCH: 28 pg (ref 26.0–34.0)
MCHC: 30.9 g/dL (ref 30.0–36.0)
MCV: 90.6 fL (ref 78.0–100.0)
Platelets: 234 10*3/uL (ref 150–400)
RBC: 2.86 MIL/uL — AB (ref 4.22–5.81)
RDW: 20 % — ABNORMAL HIGH (ref 11.5–15.5)
WBC: 7.1 10*3/uL (ref 4.0–10.5)

## 2014-05-17 LAB — URINALYSIS, ROUTINE W REFLEX MICROSCOPIC
Glucose, UA: NEGATIVE mg/dL
HGB URINE DIPSTICK: NEGATIVE
KETONES UR: NEGATIVE mg/dL
Nitrite: POSITIVE — AB
PROTEIN: 100 mg/dL — AB
SPECIFIC GRAVITY, URINE: 1.027 (ref 1.005–1.030)
UROBILINOGEN UA: 1 mg/dL (ref 0.0–1.0)
pH: 5 (ref 5.0–8.0)

## 2014-05-17 LAB — CBG MONITORING, ED
GLUCOSE-CAPILLARY: 102 mg/dL — AB (ref 70–99)
Glucose-Capillary: 102 mg/dL — ABNORMAL HIGH (ref 70–99)

## 2014-05-17 LAB — PROTIME-INR
INR: 6.62 (ref 0.00–1.49)
INR: 7.79 (ref 0.00–1.49)
PROTHROMBIN TIME: 65.6 s — AB (ref 11.6–15.2)
Prothrombin Time: 57.8 seconds — ABNORMAL HIGH (ref 11.6–15.2)

## 2014-05-17 LAB — URINE MICROSCOPIC-ADD ON

## 2014-05-17 LAB — I-STAT TROPONIN, ED: TROPONIN I, POC: 0.01 ng/mL (ref 0.00–0.08)

## 2014-05-17 LAB — SALICYLATE LEVEL

## 2014-05-17 LAB — GLUCOSE, CAPILLARY: Glucose-Capillary: 129 mg/dL — ABNORMAL HIGH (ref 70–99)

## 2014-05-17 LAB — COMPREHENSIVE METABOLIC PANEL
ALK PHOS: 154 U/L — AB (ref 39–117)
ALT: 10 U/L (ref 0–53)
AST: 37 U/L (ref 0–37)
Albumin: 2.9 g/dL — ABNORMAL LOW (ref 3.5–5.2)
BILIRUBIN TOTAL: 0.5 mg/dL (ref 0.3–1.2)
BUN: 35 mg/dL — ABNORMAL HIGH (ref 6–23)
CO2: 8 meq/L — AB (ref 19–32)
Calcium: 7.9 mg/dL — ABNORMAL LOW (ref 8.4–10.5)
Chloride: 94 mEq/L — ABNORMAL LOW (ref 96–112)
Creatinine, Ser: 4.95 mg/dL — ABNORMAL HIGH (ref 0.50–1.35)
GFR calc non Af Amer: 12 mL/min — ABNORMAL LOW (ref >90)
GFR, EST AFRICAN AMERICAN: 14 mL/min — AB (ref >90)
GLUCOSE: 107 mg/dL — AB (ref 70–99)
POTASSIUM: 4.6 meq/L (ref 3.7–5.3)
Sodium: 143 mEq/L (ref 137–147)
Total Protein: 6.3 g/dL (ref 6.0–8.3)

## 2014-05-17 LAB — URIC ACID: URIC ACID, SERUM: 24.8 mg/dL — AB (ref 4.0–7.8)

## 2014-05-17 LAB — CREATININE, URINE, RANDOM: Creatinine, Urine: 446.26 mg/dL

## 2014-05-17 LAB — I-STAT CG4 LACTIC ACID, ED: Lactic Acid, Venous: 3.23 mmol/L — ABNORMAL HIGH (ref 0.5–2.2)

## 2014-05-17 LAB — MRSA PCR SCREENING: MRSA by PCR: POSITIVE — AB

## 2014-05-17 LAB — SODIUM, URINE, RANDOM: Sodium, Ur: 23 mEq/L

## 2014-05-17 LAB — PRO B NATRIURETIC PEPTIDE: Pro B Natriuretic peptide (BNP): 8040 pg/mL — ABNORMAL HIGH (ref 0–125)

## 2014-05-17 LAB — OSMOLALITY, URINE: Osmolality, Ur: 311 mOsm/kg — ABNORMAL LOW (ref 390–1090)

## 2014-05-17 MED ORDER — MORPHINE SULFATE 2 MG/ML IJ SOLN: 2.0000 mg | INTRAMUSCULAR | Status: DC | PRN

## 2014-05-17 MED ORDER — ASPIRIN 81 MG PO CHEW: 324.0000 mg | CHEWABLE_TABLET | ORAL | Status: DC

## 2014-05-17 MED ORDER — SODIUM CHLORIDE 0.9 % IV SOLN: 250.0000 mL | INTRAVENOUS | Status: DC | PRN

## 2014-05-17 MED ORDER — VITAMIN K1 10 MG/ML IJ SOLN
10.0000 mg | Freq: Two times a day (BID) | INTRAMUSCULAR | Status: DC
  Administered 2014-05-18 (×3): 10 mg via SUBCUTANEOUS
  Filled 2014-05-17 (×5): qty 1

## 2014-05-17 MED ORDER — SODIUM BICARBONATE 8.4 % IV SOLN
50.0000 meq | Freq: Once | INTRAVENOUS | Status: AC
  Administered 2014-05-17: 50 meq via INTRAVENOUS
  Filled 2014-05-17: qty 50

## 2014-05-17 MED ORDER — ASPIRIN 300 MG RE SUPP: 300.0000 mg | RECTAL | Status: DC

## 2014-05-17 MED ORDER — DEXTROSE 5 % IV SOLN: INTRAVENOUS | Status: DC

## 2014-05-17 MED ORDER — LIDOCAINE-PRILOCAINE 2.5-2.5 % EX CREA
TOPICAL_CREAM | Freq: Once | CUTANEOUS | Status: DC
  Filled 2014-05-17: qty 5

## 2014-05-17 MED ORDER — INSULIN ASPART 100 UNIT/ML ~~LOC~~ SOLN
0.0000 [IU] | SUBCUTANEOUS | Status: DC
  Administered 2014-05-17 – 2014-05-18 (×8): 2 [IU] via SUBCUTANEOUS

## 2014-05-17 MED ORDER — PIPERACILLIN-TAZOBACTAM IN DEX 2-0.25 GM/50ML IV SOLN: 2.2500 g | Freq: Three times a day (TID) | INTRAVENOUS | Status: DC

## 2014-05-17 MED ORDER — STERILE WATER FOR INJECTION IV SOLN
INTRAVENOUS | Status: DC
  Administered 2014-05-17: 16:00:00 via INTRAVENOUS
  Filled 2014-05-17 (×2): qty 850

## 2014-05-17 MED ORDER — FAMOTIDINE IN NACL 20-0.9 MG/50ML-% IV SOLN
20.0000 mg | Freq: Two times a day (BID) | INTRAVENOUS | Status: DC
  Administered 2014-05-17 – 2014-05-19 (×5): 20 mg via INTRAVENOUS
  Filled 2014-05-17 (×7): qty 50

## 2014-05-17 MED ORDER — ONDANSETRON HCL 4 MG/2ML IJ SOLN
4.0000 mg | Freq: Four times a day (QID) | INTRAMUSCULAR | Status: DC | PRN
  Administered 2014-05-19: 4 mg via INTRAVENOUS
  Filled 2014-05-17: qty 2

## 2014-05-17 MED ORDER — SODIUM CHLORIDE 0.9 % IV BOLUS (SEPSIS)
1000.0000 mL | Freq: Once | INTRAVENOUS | Status: AC
  Administered 2014-05-17: 1000 mL via INTRAVENOUS

## 2014-05-17 MED ORDER — FENTANYL CITRATE 0.05 MG/ML IJ SOLN
12.5000 ug | INTRAMUSCULAR | Status: DC | PRN
  Administered 2014-05-19 – 2014-05-20 (×2): 12.5 ug via INTRAVENOUS
  Filled 2014-05-17 (×2): qty 2

## 2014-05-17 MED ORDER — PIPERACILLIN-TAZOBACTAM IN DEX 2-0.25 GM/50ML IV SOLN
2.2500 g | Freq: Three times a day (TID) | INTRAVENOUS | Status: DC
  Filled 2014-05-17 (×2): qty 50

## 2014-05-17 MED ORDER — SODIUM BICARBONATE 8.4 % IV SOLN: 50.0000 meq | Freq: Once | INTRAVENOUS | Status: DC

## 2014-05-17 MED ORDER — PIPERACILLIN-TAZOBACTAM IN DEX 2-0.25 GM/50ML IV SOLN
2.2500 g | Freq: Three times a day (TID) | INTRAVENOUS | Status: DC
  Administered 2014-05-18 – 2014-05-19 (×5): 2.25 g via INTRAVENOUS
  Filled 2014-05-17 (×8): qty 50

## 2014-05-17 MED ORDER — VITAMIN K1 10 MG/ML IJ SOLN: 10.0000 mg | Freq: Every day | INTRAMUSCULAR | Status: DC

## 2014-05-17 MED ORDER — CEFTRIAXONE SODIUM 1 G IJ SOLR
1.0000 g | Freq: Once | INTRAMUSCULAR | Status: AC
  Administered 2014-05-17: 1 g via INTRAVENOUS
  Filled 2014-05-17: qty 10

## 2014-05-17 MED ORDER — STERILE WATER FOR INJECTION IV SOLN: INTRAVENOUS | Status: DC

## 2014-05-17 MED ORDER — SODIUM BICARBONATE 8.4 % IV SOLN
INTRAVENOUS | Status: DC
  Administered 2014-05-17 – 2014-05-18 (×3): via INTRAVENOUS
  Filled 2014-05-17 (×5): qty 150

## 2014-05-17 NOTE — Progress Notes (Addendum)
[facility] PCCM PROGRESS NOTE  S: Called to bedside by [facility] MD. RN attempted foley catheter insertion, during which she encountered resistance. Foley was withdrawn. Patient then had bloody urethral discharge. Rn's attempted to apply pressure but there was no relief. Of note he is on coumadin, and INR is noted to be supratherapeutic.   O: BP 122/61  Pulse 80  Temp(Src) 97.9 F (36.6 C) (Oral)  Resp 18  Ht 5\' 4"  (1.626 m)  Wt 94.6 kg (208 lb 8.9 oz)  BMI 35.78 kg/m2  SpO2 98%  General:  53 yo male in NAD Neuro:  Confused, responds to some questions appropriately.  HEENT:  [place]/AT, PERRL Neck:  Supple, no JVD noted Cardiovascular:  RRR Lungs:  Clear anteriorly Abdomen:  Soft, non-tender, non-distended Musculoskeletal:  No acute deformity Skin:  Intact, MMM  LABS: CBC Latest Ref Rng 05/17/2014 05/17/2014 05/17/2014  WBC 4.0 - 10.5 K/uL 7.1 - 6.9  Hemoglobin 13.0 - 17.0 g/dL 8.0(L) 13.3 9.1(L)  Hematocrit 39.0 - 52.0 % 25.9(L) 39.0 29.6(L)  Platelets 150 - 400 K/uL 234 - 247   INR - 7.79   A/P: Coumadin coagulopathy Urethral bleeding (appears to have resolved externally)   - Vit K 10 mg now and BID until coags stable.   - Bladder scan hourly for 3 hours to ensure no retrograde bleeding   - Continue to monitor  GOC discussion:  I had a long discussion with Mr. Thoma wife. She states she is unclear of any prognosis for her husband, but knows that he is very sick at this time. She reportedly asked him whether or not he would want a breathing tube or dialysis to which he responded NO very emphatically. She is also of the belief that she would not want to perform any therapies that would result in prolonged life support. She has decided to make him a DNR status at this point should any life threatening complications develop. I think it is important to contact his oncologist in the AM to determine what his idea of prognosis is, in order to guide our care, and that of nephrology.    Georgann Housekeeper, ACNP [facility] Pulmonology/Critical Care Pager [phone] or [phone]

## 2014-05-17 NOTE — ED Notes (Signed)
Patient transported to X-ray 

## 2014-05-17 NOTE — ED Notes (Signed)
Pt can't stand up to get weighed and couldn't state a height or weight

## 2014-05-17 NOTE — ED Notes (Addendum)
CBG 102 

## 2014-05-17 NOTE — ED Provider Notes (Addendum)
CSN: [id number]     Arrival date & time 05/17/14  1119 History   First MD Initiated Contact with Patient 05/17/14 1206     Chief Complaint  Patient presents with  . Altered Mental Status     (Consider location/radiation/quality/duration/timing/severity/associated sxs/prior Treatment) The history is provided by the patient and the spouse. The history is limited by the condition of the patient.  JUNIOR HUEZO is a 53 y.o. male hx of Multiple myeloma, HTN here with confusion, AMS. He woke up this morning with confusion. As per wife, he doesn't remember where he is. He also asked for the wrong medicine. Denies vomiting or abdominal pain. Has been coughing. As per wife, he has been making girgling noises today. Was at the office yesterday and had Cr 2.9 (acute), INR 10 and was given vitamin K.    Level V caveat- AMS   Past Medical History  Diagnosis Date  . Multiple myeloma 09/24/2011  . Hypertension   . History of radiation therapy 07/09/2007-07/30/07    3000 cGy to left pelvis and proximal femur   Past Surgical History  Procedure Laterality Date  . Video bronchoscopy  12/11/2012    Procedure: VIDEO BRONCHOSCOPY;  Surgeon: Ivin Poot, MD;  Location: [place];  Service: Thoracic;  Laterality: N/A;  . Video assisted thoracoscopy  12/11/2012    Procedure: VIDEO ASSISTED THORACOSCOPY;  Surgeon: Ivin Poot, MD;  Location: [place];  Service: Thoracic;  Laterality: Right;  . Decortication  12/11/2012    Procedure: DECORTICATION;  Surgeon: Ivin Poot, MD;  Location: [facility] OR;  Service: Thoracic;  Laterality: N/A;   Family History  Problem Relation Age of Onset  . Diabetic kidney disease Mother   . Hypertension Mother   . Hypertension Father   . Kidney failure Father    History  Substance Use Topics  . Smoking status: Never Smoker   . Smokeless tobacco: Never Used  . Alcohol Use: No    Review of Systems  Unable to perform ROS: Mental status change      Allergies  Red dye  and Other  Home Medications   Prior to Admission medications   Medication Sig Start Date End Date Taking? Authorizing Tylasia Fletchall  cloNIDine (CATAPRES) 0.2 MG tablet Take 0.2 mg by mouth 3 (three) times daily.   Yes Historical Atanacio Melnyk, MD  dexamethasone (DECADRON) 4 MG tablet Take 10 tablets (40 mg total) by mouth once a week. Mondays with chemo 04/18/14  Yes Adrena E Johnson, PA-C  furosemide (LASIX) 20 MG tablet Take 25m (1 tablet) daily, alternating with 482m(2 tablets)  every other day. 03/21/14  Yes Adrena E Johnson, PA-C  gabapentin (NEURONTIN) 300 MG capsule Take 1 capsule (300 mg total) by mouth 3 (three) times daily. 04/05/14  Yes AdCarlton AdamPA-C  HYDROcodone-acetaminophen (NORCO) 10-325 MG per tablet Take 1-2 tablets by mouth every 6 (six) hours as needed. For pain. 05/09/14  Yes AdCarlton AdamPA-C  lidocaine-prilocaine (EMLA) cream Apply 1 application topically as needed. 12/07/13  Yes MoCurt BearsMD  morphine (MS CONTIN) 60 MG 12 hr tablet Take 1 tablet (60 mg total) by mouth every 12 (twelve) hours. 05/09/14  Yes Adrena E Johnson, PA-C  Nebivolol HCl (BYSTOLIC) 20 MG TABS Take 30 mg by mouth daily.   Yes Historical Cadarius Nevares, MD  omeprazole (PRILOSEC) 40 MG capsule Take 40 mg by mouth daily as needed (for heartburn).    Yes Historical Kenyon Eshleman, MD  pomalidomide (POMALYST) 3 MG  capsule Take 1 capsule (3 mg total) by mouth daily. Take with water on days 1-21. Repeat every 28 days. 04/14/14  Yes Curt Bears, MD  potassium chloride SA (K-DUR,KLOR-CON) 20 MEQ tablet Take 1 tablet (20 mEq total) by mouth daily. For 7 days 05/10/14  Yes Carlton Adam, PA-C  PRESCRIPTION MEDICATION Receives chemo at [facility] weekly oncologist is Dr. Earlie Server   Yes Historical Aidyn Sportsman, MD  prochlorperazine (COMPAZINE) 10 MG tablet Take 10 mg by mouth every 6 (six) hours as needed for nausea. 09/06/13  Yes Curt Bears, MD  warfarin (COUMADIN) 5 MG tablet Take 5 mg by mouth See admin instructions.  Take as directed. Multiple dose changes recently.   Yes Historical Nayan Proch, MD   BP 101/53  Pulse 79  Temp(Src) 98.3 F (36.8 C) (Oral)  Resp 20  SpO2 98% Physical Exam  Nursing note and vitals reviewed. Constitutional:  Altered, chronically ill   HENT:  Head: Normocephalic.  MM slightly dry   Eyes: Conjunctivae are normal. Pupils are equal, round, and reactive to light.  Neck: Normal range of motion. Neck supple.  Cardiovascular: Normal rate, regular rhythm and normal heart sounds.   Pulmonary/Chest:  Diminished bilaterally.   Abdominal: Soft. Bowel sounds are normal. He exhibits no distension. There is no tenderness. There is no rebound and no guarding.  Musculoskeletal: Normal range of motion. He exhibits no edema and no tenderness.  Neurological:  Confused. Moving all extremities. Strength 4/5 bilaterally.   Skin: Skin is warm and dry.  Psychiatric:  Unable     ED Course  Procedures (including critical care time)  CRITICAL CARE Performed by: Darl Householder, [name]   Total critical care time: 30 min   Critical care time was exclusive of separately billable procedures and treating other patients.  Critical care was necessary to treat or prevent imminent or life-threatening deterioration.  Critical care was time spent personally by me on the following activities: development of treatment plan with patient and/or surrogate as well as nursing, discussions with consultants, evaluation of patient's response to treatment, examination of patient, obtaining history from patient or surrogate, ordering and performing treatments and interventions, ordering and review of laboratory studies, ordering and review of radiographic studies, pulse oximetry and re-evaluation of patient's condition.   Labs Review Labs Reviewed  CBC WITH DIFFERENTIAL - Abnormal; Notable for the following:    RBC 3.24 (*)    Hemoglobin 9.1 (*)    HCT 29.6 (*)    RDW 20.1 (*)    Neutrophils Relative % 79 (*)     All other components within normal limits  COMPREHENSIVE METABOLIC PANEL - Abnormal; Notable for the following:    Chloride 94 (*)    CO2 8 (*)    Glucose, Bld 107 (*)    BUN 35 (*)    Creatinine, Ser 4.95 (*)    Calcium 7.9 (*)    Albumin 2.9 (*)    Alkaline Phosphatase 154 (*)    GFR calc non Af Amer 12 (*)    GFR calc Af Amer 14 (*)    All other components within normal limits  URINALYSIS, ROUTINE W REFLEX MICROSCOPIC - Abnormal; Notable for the following:    Color, Urine RED (*)    APPearance TURBID (*)    Bilirubin Urine LARGE (*)    Protein, ur 100 (*)    Nitrite POSITIVE (*)    Leukocytes, UA SMALL (*)    All other components within normal limits  PROTIME-INR - Abnormal; Notable for  the following:    Prothrombin Time 57.8 (*)    INR 6.62 (*)    All other components within normal limits  BLOOD GAS, ARTERIAL - Abnormal; Notable for the following:    pH, Arterial 7.319 (*)    pCO2 arterial 15.5 (*)    pO2, Arterial 107.0 (*)    Bicarbonate 7.7 (*)    Acid-base deficit 17.0 (*)    All other components within normal limits  URINE MICROSCOPIC-ADD ON - Abnormal; Notable for the following:    Bacteria, UA FEW (*)    Casts GRANULAR CAST (*)    All other components within normal limits  I-STAT CHEM 8, ED - Abnormal; Notable for the following:    BUN 30 (*)    Creatinine, Ser 5.50 (*)    Calcium, Ion 0.98 (*)    All other components within normal limits  CBG MONITORING, ED - Abnormal; Notable for the following:    Glucose-Capillary 102 (*)    All other components within normal limits  I-STAT CG4 LACTIC ACID, ED - Abnormal; Notable for the following:    Lactic Acid, Venous 3.23 (*)    All other components within normal limits  CULTURE, BLOOD (ROUTINE X 2)  CULTURE, BLOOD (ROUTINE X 2)  PRO B NATRIURETIC PEPTIDE  SODIUM, URINE, RANDOM  OSMOLALITY, URINE  CREATININE, URINE, RANDOM  SALICYLATE LEVEL  I-STAT TROPOININ, ED    Imaging Review Dg Chest 2 View  05/17/2014    CLINICAL DATA:  ALTERED MENTAL STATUS ALTERED MENTAL STATUS  EXAM: CHEST  2 VIEW  COMPARISON:  Prior radiograph from 07/05/2013  FINDINGS: Right-sided Port-A-Cath in place with tip overlying the mid SVC. Cardiomegaly is grossly stable. Mediastinal silhouette within normal limits.  The lungs are normally inflated. No airspace consolidation or pulmonary edema is identified. Irregular pleural thickening along the lateral right lung related to history of multiple myeloma is grossly similar. There is no pneumothorax.  Changes of multiple myeloma with fracture and irregularity of the right posterior sixth rib is grossly stable from prior CT. No definite acute osseous abnormality.  IMPRESSION: 1. No active cardiopulmonary abnormality identified. 2. Similar changes related to multiple myeloma.   Electronically Signed   By: Jeannine Boga M.D.   On: 05/17/2014 14:00   Ct Head Wo Contrast  05/17/2014   CLINICAL DATA:  Altered mental status.  EXAM: CT HEAD WITHOUT CONTRAST  TECHNIQUE: Contiguous axial images were obtained from the base of the skull through the vertex without intravenous contrast.  COMPARISON:  Head CT scan 09/03/2012.  FINDINGS: Cortical atrophy is again seen. No evidence of acute abnormality including hemorrhage, infarct, mass lesion, mass effect, midline shift or abnormal extra-axial fluid collection is identified. There is no hydrocephalus or pneumocephalus. The calvarium is intact. Tiny lucencies in the calvarium consistent with history of multiple myeloma are noted. Tiny mucous retention cyst or polyp left sphenoid sinus is seen. Sinus disease seen on the prior examination is markedly improved.  IMPRESSION: No acute abnormality.  Improved sinus disease.   Electronically Signed   By: Inge Rise M.D.   On: 05/17/2014 12:37     EKG Interpretation None      Date: 05/17/2014  Rate: 77  Rhythm: normal sinus rhythm  QRS Axis: normal  Intervals: PR prolonged  ST/T Wave abnormalities:  normal  Conduction Disutrbances:first-degree A-V block   Narrative Interpretation: poor baseline   Old EKG Reviewed: unchanged    MDM   Final diagnoses:  None    Jeneen Rinks  E Silber is a 53 y.o. male here with AMS, confusion. Given elevated INR yesterday, consider intracranial hemorrhage. Also consider sepsis, renal failure. Abdomen nontender and no abdominal pain so I doubt retroperitoneal hemorrhage.   1:40 PM Patient's pH 7,3, bicarb 7 on ABG. Will start bicarb drip.   2 PM I called Dr. Jeanella Anton, nephrology, who concur with bicarb drip. Will transfer to [facility]. I also consulted critical care, who will see patient. Recommend lactate, UA.   2:53 PM Critical care saw patient, will admit to ICU. UA + UTI. Lactate 3. Given ceftriaxone     Wandra Arthurs, MD 05/17/14 1454  Wandra Arthurs, MD 05/17/14 [phone]

## 2014-05-17 NOTE — ED Notes (Signed)
Per spouse, pt from home.  Pt has hx of myeloma.  Pt woke up this am very confused.  Pt having changes in breathing.  Pt was told creatine was elevated after labs yesterday.  Primary MD aware of pt being here.

## 2014-05-17 NOTE — ED Notes (Signed)
Per pt wife, pt has not voided urine since 7:00am yesterday morning. Explained to Dr. Darl Householder and he stated to I/O Pt if Less than 25mL is present. Bladder scanner concluded an estimate of 113mL.

## 2014-05-17 NOTE — ED Notes (Signed)
MD at bedside. 

## 2014-05-17 NOTE — Progress Notes (Signed)
Bedside discussion in conference room at [facility] ER earlier in day. Power blew out and this not could not be documented promptly   Present: pastor and wife and Dr Chase Caller  Wife expressed several concerns   - frustrated at uncertainty of myeloma prognosis. She says that he has outlived original myeloma prognosis by years. Last few months he has been bed ridden and progressively declining - ECOG 4 with poor po. She feels he might be terminal but concerned that not getting reliable information about prognosis from health care team  - WE discussed CPR and DEFIB: She says based on substituted judgment and best interest and our discussion on CPR: He will bE DNAR  - Explained that currently there is short term prognostic uncertaintly: therefore ok for short term ventilation but no trach or ltach   - regarding HD: we agreed to defer to renal recommendation  - communication: says husband blood family can be very emotional and might react adversely to bad news. THerefore, she wants streamlined communication. Updates and discussion only with patient daughter Caryl Pina and patient wife     14 min  Dr. Brand Males, M.D., [facility].C.P Pulmonary and Critical Care Medicine Staff Physician [place] [facility] and Critical Care Pager: [phone], If no answer or between  15:00h - 7:00h: call [phone]  05/17/2014 11:24 PM

## 2014-05-17 NOTE — Progress Notes (Signed)
Attempted to place foley catheter per MD order, met resistance and when pulled back there was blood in tubing. Pulled foley completely out and penis noted with blood pulsating out. Bladder scanned pt and none found. Dr. Halford Chessman made aware. Will continue to monitor for bleeding.

## 2014-05-17 NOTE — Progress Notes (Signed)
ANTIBIOTIC CONSULT NOTE - INITIAL  Pharmacy Consult for Zosyn Indication: UTI  Allergies  Allergen Reactions  . Red Dye Anaphylaxis    Lips swollen  3-4 times their baseline size  . Other Other (See Comments)    Strawberries "anything containing red dye"    Patient Measurements:   Last documented weight 95 kg (05/16/14)  Vital Signs: Temp: 97.9 F (36.6 C) (06/30 1455) Temp src: Oral (06/30 1455) BP: 111/64 mmHg (06/30 1455) Pulse Rate: 82 (06/30 1455)  Labs:  Recent Labs  05/16/14 0813 05/16/14 0814 05/17/14 1258 05/17/14 1312  WBC 6.3  --  6.9  --   HGB 9.9*  --  9.1* 13.3  PLT 184  --  247  --   CREATININE  --  2.9* 4.95* 5.50*   The CrCl is unknown because both a height and weight (above a minimum accepted value) are required for this calculation.   Microbiology: Recent Results (from the past 720 hour(s))  TECHNOLOGIST REVIEW     Status: None   Collection Time    05/16/14  8:13 AM      Result Value Ref Range Status   Technologist Review Variant lymphs present, appear plasmacytoid   Final    Medical History: Past Medical History  Diagnosis Date  . Multiple myeloma 09/24/2011  . Hypertension   . History of radiation therapy 07/09/2007-07/30/07    3000 cGy to left pelvis and proximal femur    Medications:  Anti-infectives   Start     Dose/Rate Route Frequency Ordered Stop   05/17/14 1400  cefTRIAXone (ROCEPHIN) 1 g in dextrose 5 % 50 mL IVPB     1 g 100 mL/hr over 30 Minutes Intravenous  Once 05/17/14 1357 05/17/14 1450     Assessment: 41 yoM presenting to [facility] on 6/30 with confusion and AMS.  PMH includes multiple myeloma on Pomalyst and chronic warfarin anticoagulation with supratherapeutic INR on 6/29.  CT head without acute abnormality.  Concern for UTI in immunocompromised patient, ceftriaxone given x1 dose in ED then pharmacy to dose Zosyn.  Tmax: 98.3  WBCs: 6.9  Renal: ARF,  SCr 5.5  Cultures pending  Goal of Therapy:  Appropriate abx  dosing, eradication of infection.   Plan:   Zosyn 2.25g IV Q8H   Follow up renal function and cultures.   Gretta Arab PharmD, BCPS Pager [phone] 05/17/2014 3:46 PM

## 2014-05-17 NOTE — Telephone Encounter (Signed)
Error

## 2014-05-17 NOTE — ED Notes (Signed)
CRITICAL VALUE ALERT  Critical value received:  INR 6.62  Date of notification:  05/17/2014 Time of notification:  1326 Critical value read back:Yes.   Nurse who received alert: Renita Papa, RN MD notified: Darl Householder Time of first page: 1330, face-to-face Primary RN notified: Jana Half at 1328, face-to-face

## 2014-05-17 NOTE — Consult Note (Signed)
[name] is an 53 y.o. male referred by Dr Chase Caller   Chief Complaint: Acute renal failure, Metabolic acidosis HPI: 89GE BM with hx IGG Kappa multiple myeloma since 2008 treated initially with revlimid/decadron, then autologous stem cell transplant 2009.  He had recurrence 2010 and treated with revlimid/decadron then velcade/doxil/decadron thru 2013.  Salvage chemo with pomalyst/cytoxan 11/13 and most recently carfilzomib/pomalyst/decadron.  He now presents AMS after CO anorexia and wt loss a few days ago.  Renal fx was nl on 04/2214 with Scr .7, then increased to 2.9 on 05/16/14 and today 5.5 with marked metabolic acidosis.  Renal US unremarkable.  Pt is poor historian but it appears PO intake has been poor.   Past Medical History  Diagnosis Date  . Multiple myeloma 09/24/2011  . Hypertension   . History of radiation therapy 07/09/2007-07/30/07    3000 cGy to left pelvis and proximal femur    Past Surgical History  Procedure Laterality Date  . Video bronchoscopy  12/11/2012    Procedure: VIDEO BRONCHOSCOPY;  Surgeon: Ivin Poot, MD;  Location: [place];  Service: Thoracic;  Laterality: N/A;  . Video assisted thoracoscopy  12/11/2012    Procedure: VIDEO ASSISTED THORACOSCOPY;  Surgeon: Ivin Poot, MD;  Location: [place];  Service: Thoracic;  Laterality: Right;  . Decortication  12/11/2012    Procedure: DECORTICATION;  Surgeon: Ivin Poot, MD;  Location: [facility] OR;  Service: Thoracic;  Laterality: N/A;    Family History  Problem Relation Age of Onset  . Diabetic kidney disease Mother   . Hypertension Mother   . Hypertension Father   . Kidney failure Father    Social History:  reports that he has never smoked. He has never used smokeless tobacco. He reports that he does not drink alcohol or use illicit drugs.  Allergies:  Allergies  Allergen Reactions  . Red Dye Anaphylaxis    Lips swollen  3-4 times their baseline size  . Other Other (See Comments)    Strawberries  "anything containing red dye"     (Not in a hospital admission)   Lab Results: UA: 3-6 wbc's granular casts.  FeNA < 1%   Recent Labs  05/16/14 0813 05/17/14 1258 05/17/14 1312  WBC 6.3 6.9  --   HGB 9.9* 9.1* 13.3  HCT 32.7* 29.6* 39.0  PLT 184 247  --    BMET  Recent Labs  05/16/14 0814 05/17/14 1258 05/17/14 1312  NA 142 143 140  K 3.5 4.6 4.3  CL  --  94* 106  CO2 18* 8*  --   GLUCOSE 72 107* 96  BUN 15.2 35* 30*  CREATININE 2.9* 4.95* 5.50*  CALCIUM 8.7 7.9*  --    LFT  Recent Labs  05/17/14 1258  PROT 6.3  ALBUMIN 2.9*  AST 37  ALT 10  ALKPHOS 154*  BILITOT 0.5   Dg Chest 2 View  05/17/2014   CLINICAL DATA:  ALTERED MENTAL STATUS ALTERED MENTAL STATUS  EXAM: CHEST  2 VIEW  COMPARISON:  Prior radiograph from 07/05/2013  FINDINGS: Right-sided Port-A-Cath in place with tip overlying the mid SVC. Cardiomegaly is grossly stable. Mediastinal silhouette within normal limits.  The lungs are normally inflated. No airspace consolidation or pulmonary edema is identified. Irregular pleural thickening along the lateral right lung related to history of multiple myeloma is grossly similar. There is no pneumothorax.  Changes of multiple myeloma with fracture and irregularity of the right posterior sixth rib is grossly stable from  prior CT. No definite acute osseous abnormality.  IMPRESSION: 1. No active cardiopulmonary abnormality identified. 2. Similar changes related to multiple myeloma.   Electronically Signed   By: Jeannine Boga M.D.   On: 05/17/2014 14:00   Ct Head Wo Contrast  05/17/2014   CLINICAL DATA:  Altered mental status.  EXAM: CT HEAD WITHOUT CONTRAST  TECHNIQUE: Contiguous axial images were obtained from the base of the skull through the vertex without intravenous contrast.  COMPARISON:  Head CT scan 09/03/2012.  FINDINGS: Cortical atrophy is again seen. No evidence of acute abnormality including hemorrhage, infarct, mass lesion, mass effect, midline  shift or abnormal extra-axial fluid collection is identified. There is no hydrocephalus or pneumocephalus. The calvarium is intact. Tiny lucencies in the calvarium consistent with history of multiple myeloma are noted. Tiny mucous retention cyst or polyp left sphenoid sinus is seen. Sinus disease seen on the prior examination is markedly improved.  IMPRESSION: No acute abnormality.  Improved sinus disease.   Electronically Signed   By: Inge Rise M.D.   On: 05/17/2014 12:37   US Renal  05/17/2014   CLINICAL DATA:  Renal insufficiency. Multiple myeloma with chemotherapy ongoing.  EXAM: RENAL/URINARY TRACT ULTRASOUND COMPLETE  COMPARISON:  Renal ultrasound 02/03/2013.  FINDINGS: Examination is mildly limited by body habitus, bowel gas and patient condition.  Right Kidney:  Length: 13.0 cm. Echogenicity within normal limits. No mass or hydronephrosis visualized.  Left Kidney:  Length: 12.8 cm. Echogenicity within normal limits. No mass or hydronephrosis visualized.  Bladder:  Nearly empty. Ureteral jets could not be adequately evaluated due to patient condition.  IMPRESSION: Both kidneys are normal in size without hydronephrosis. No definite acute abnormalities identified with limitations as noted above.   Electronically Signed   By: Camie Patience M.D.   On: 05/17/2014 17:44    ROS: Difficult to get precise hx No change in vision No CP No SOB No abd pain  PHYSICAL EXAM: Blood pressure 118/61, pulse 80, temperature 97.9 F (36.6 C), temperature source Oral, resp. rate 20, SpO2 98.00%. HEENT: PERRLA EOMI NECK:No JVD Chest:  Rt porta cath LUNGS:clear CARDIAC:RRR WO MRG ABD:+ BS NT ND No HSM EXT:1+ edema NEURO:Follows simple commands.  Ox1 (person).  No asterixis  Assessment: 1. ARF that I suspect is related to his myeloma (ie myeloma Kidney).  His FeNa is low which raises possibility of volume depletion but renal fx has worsened quite rapidly.  He has been on carfilzomib which can cause ARF  but I think this is less likely as it appears  last dose was 4 weeks ago. 2. High AG Metabolic acidosis 3. MM PLAN: 1. IV bicarb and hydration as you are doing 2. Foley cath 3. Daily renal fx 4. Will need to get input from Oncology on prognosis as I think a real discussion about QOL needs to happen as he may need HD if it is deemed appropriate.  Hopefully his MS will improve to have this discussion.   MATTINGLY,MICHAEL T 05/17/2014, 6:22 PM2

## 2014-05-17 NOTE — ED Notes (Signed)
Attempted to call report - RN to return call.  

## 2014-05-17 NOTE — Telephone Encounter (Addendum)
Received phone call from patient's wife Otila Kluver) stating that patient woke up this morning with confusion, has not urinated since 0700 yesterday 06/29, and has not ate in 3 days. Placed note on Dr. Worthy Flank desk.

## 2014-05-17 NOTE — Progress Notes (Signed)
CRITICAL VALUE ALERT  Critical value received:  INR 7.79  Date of notification:  05/17/2014  Time of notification:  2200  Critical value read back:Yes.    Nurse who received alert:  Fayrene Helper  MD notified (1st page):  Georgann Housekeeper NP  Time of first page:  2210  MD notified (2nd page):  Time of second page:  Responding MD:  Georgann Housekeeper NP  Time MD responded:  [phone]

## 2014-05-17 NOTE — H&P (Signed)
PULMONARY / CRITICAL CARE MEDICINE   Name: [name] MRN: [id number] DOB: [birth year]    ADMISSION DATE:  05/17/2014 CONSULTATION DATE:  05/17/14  REFERRING MD :  [facility] ED PRIMARY SERVICE: [facility] ED -> PCCM  CHIEF COMPLAINT: Confusion, UTI, metabolic acidosis  BRIEF PATIENT DESCRIPTION: 53 y.o. Male with h/o multiple myeloma (Stage IIIB) - on empiric anticoagulation, hx of chest tube for empyema, htn, and arteritis who presented [facility]30 with a productive cough, altered mental status and slurred speech without focal deficits which began at 0500. In the ED he was diagnosed and treated for acute renal failure, UTI and acute metabolic acidosis with concominant respiratory alkalosis and an anion gap of 29. PCCM consulted - plan to transfer to [facility] for ICU care and possible dialysis.   SIGNIFICANT EVENTS / STUDIES:  6/30: Arrived to [facility]ED and transferred to [facility] for acute renal failure Head CT 6/30: no acute abnormality, improved sinus disease  LINES / TUBES: PIVs  CULTURES: Blood cultures 6/30 >>  ANTIBIOTICS: Ceftriaxone 6/30 x1 Zosyn 6/30 >>  HISTORY OF PRESENT ILLNESS:  53 y.o. Male with h/o multiple myeloma (Stage IIIB) - on anticoagulation, empyema, htn, and arteritis who presented to [facility] ED on 6/30 with a productive cough, altered mental status and slurred speech which began at 0500. His wife also noted he has had occassional diaphoresis, 15 pound unintentional weight loss, decreased urine output, decreased appetite, nausea and diarrhea.  In the ED his head CT was normal, urinanylsis was positive for infection, CXR with mild atelectasis and ABG with acute metabolic acidosis with concominant respiratory alkalosis and an anion gap of 29.  Wife reports patient usually is able to manage his own medications but has not taken them for one week.  He has had decreased appetite for two weeks with nausea and occasional diarrhea.  Was in his 'normal' state of health one month  ago.     PAST MEDICAL HISTORY :  Past Medical History  Diagnosis Date  . Multiple myeloma 09/24/2011  . Hypertension   . History of radiation therapy 07/09/2007-07/30/07    3000 cGy to left pelvis and proximal femur   Past Surgical History  Procedure Laterality Date  . Video bronchoscopy  12/11/2012    Procedure: VIDEO BRONCHOSCOPY;  Surgeon: Ivin Poot, MD;  Location: [place];  Service: Thoracic;  Laterality: N/A;  . Video assisted thoracoscopy  12/11/2012    Procedure: VIDEO ASSISTED THORACOSCOPY;  Surgeon: Ivin Poot, MD;  Location: [place];  Service: Thoracic;  Laterality: Right;  . Decortication  12/11/2012    Procedure: DECORTICATION;  Surgeon: Ivin Poot, MD;  Location: [place];  Service: Thoracic;  Laterality: N/A;   Prior to Admission medications   Medication Sig Start Date End Date Taking? Authorizing Khalid Lacko  cloNIDine (CATAPRES) 0.2 MG tablet Take 0.2 mg by mouth 3 (three) times daily.   Yes Historical Stokes Rattigan, MD  dexamethasone (DECADRON) 4 MG tablet Take 10 tablets (40 mg total) by mouth once a week. Mondays with chemo 04/18/14  Yes Adrena E Johnson, PA-C  furosemide (LASIX) 20 MG tablet Take 73m (1 tablet) daily, alternating with 473m(2 tablets)  every other day. 03/21/14  Yes Adrena E Johnson, PA-C  gabapentin (NEURONTIN) 300 MG capsule Take 1 capsule (300 mg total) by mouth 3 (three) times daily. 04/05/14  Yes AdCarlton AdamPA-C  HYDROcodone-acetaminophen (NORCO) 10-325 MG per tablet Take 1-2 tablets by mouth every 6 (six) hours as  needed. For pain. 05/09/14  Yes Carlton Adam, PA-C  lidocaine-prilocaine (EMLA) cream Apply 1 application topically as needed. 12/07/13  Yes Curt Bears, MD  morphine (MS CONTIN) 60 MG 12 hr tablet Take 1 tablet (60 mg total) by mouth every 12 (twelve) hours. 05/09/14  Yes Adrena E Johnson, PA-C  Nebivolol HCl (BYSTOLIC) 20 MG TABS Take 30 mg by mouth daily.   Yes Historical Felder Lebeda, MD  omeprazole (PRILOSEC) 40 MG capsule Take 40  mg by mouth daily as needed (for heartburn).    Yes Historical Matt Delpizzo, MD  pomalidomide (POMALYST) 3 MG capsule Take 1 capsule (3 mg total) by mouth daily. Take with water on days 1-21. Repeat every 28 days. 04/14/14  Yes Curt Bears, MD  potassium chloride SA (K-DUR,KLOR-CON) 20 MEQ tablet Take 1 tablet (20 mEq total) by mouth daily. For 7 days 05/10/14  Yes Carlton Adam, PA-C  PRESCRIPTION MEDICATION Receives chemo at [facility] weekly oncologist is Dr. Earlie Server   Yes Historical Tahara Ruffini, MD  prochlorperazine (COMPAZINE) 10 MG tablet Take 10 mg by mouth every 6 (six) hours as needed for nausea. 09/06/13  Yes Curt Bears, MD  warfarin (COUMADIN) 5 MG tablet Take 5 mg by mouth See admin instructions. Take as directed. Multiple dose changes recently.   Yes Historical Trev Boley, MD   Allergies  Allergen Reactions  . Red Dye Anaphylaxis    Lips swollen  3-4 times their baseline size  . Other Other (See Comments)    Strawberries "anything containing red dye"    FAMILY HISTORY:  Family History  Problem Relation Age of Onset  . Diabetic kidney disease Mother   . Hypertension Mother   . Hypertension Father   . Kidney failure Father    SOCIAL HISTORY:  reports that he has never smoked. He has never used smokeless tobacco. He reports that he does not drink alcohol or use illicit drugs.  REVIEW OF SYSTEMS:  Patient is confused, all medical history obtained from wife and mother at bedside.   SUBJECTIVE:  Patient is going to be transferred to [facility] ICU for treatment of acute renal failure - acute metabolic acidosis and UTI.   VITAL SIGNS: Temp:  [98 F (36.7 C)-98.3 F (36.8 C)] 98.3 F (36.8 C) (06/30 1151) Pulse Rate:  [79-81] 79 (06/30 1151) Resp:  [18-20] 20 (06/30 1151) BP: (101-106)/(52-53) 101/53 mmHg (06/30 1151) SpO2:  [98 %-100 %] 98 % (06/30 1151) RA    INTAKE / OUTPUT: Intake/Output     06/29 0701 - 06/30 0700 06/30 0701 - 07/01 0700   Urine  150   Total  Output   150   Net   -150          PHYSICAL EXAMINATION: General: Chronically ill appearing male Neuro: Alert, oriented to self only, follows simple commands, MAEs HEENT: RA, PERRL Cardiovascular: RRR, no murmurs Lungs: Crackles b/l base - clearing with cough, upper airway congestion Abdomen: Raised umbilical scaring unchanged from baseline, no distension, no pain to palpation  Musculoskeletal: MAEs Skin: LE edema 2+ pitting edema, 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, right exterior thoracotomy scar  LABS:  PULMONARY  Recent Labs Lab 05/17/14 1237 05/17/14 1312  PHART 7.319*  --   PCO2ART 15.5*  --   PO2ART 107.0*  --   HCO3 7.7*  --   TCO2 7.4 9  O2SAT 96.9  --     CBC  Recent Labs Lab 05/16/14 0813 05/17/14 1258 05/17/14 1312  HGB 9.9* 9.1*  13.3  HCT 32.7* 29.6* 39.0  WBC 6.3 6.9  --   PLT 184 247  --     COAGULATION  Recent Labs Lab 05/16/14 05/16/14 0813 05/16/14 0814 05/17/14 1258  INR >10 Sent out for confirmation >10.00* 6.62*    CARDIAC  No results found for this basename: TROPONINI,  in the last 168 hours  Recent Labs Lab 05/17/14 1342  PROBNP 8040.0*     CHEMISTRY  Recent Labs Lab 05/16/14 0814 05/17/14 1258 05/17/14 1312  NA 142 143 140  K 3.5 4.6 4.3  CL  --  94* 106  CO2 18* 8*  --   GLUCOSE 72 107* 96  BUN 15.2 35* 30*  CREATININE 2.9* 4.95* 5.50*  CALCIUM 8.7 7.9*  --    The CrCl is unknown because both a height and weight (above a minimum accepted value) are required for this calculation.   LIVER  Recent Labs Lab 05/16/14 05/16/14 0813 05/16/14 0814 05/17/14 1258  AST  --   --  38* 37  ALT  --   --  11 10  ALKPHOS  --   --  149 154*  BILITOT  --   --  0.96 0.5  PROT  --   --  5.9* 6.3  ALBUMIN  --   --  2.8* 2.9*  INR >10 Sent out for confirmation >10.00* 6.62*     INFECTIOUS  Recent Labs Lab 05/17/14 1348  LATICACIDVEN 3.23*     ENDOCRINE CBG (last 3)   Recent Labs  05/17/14 1154   GLUCAP 102*         IMAGING x48h  Dg Chest 2 View  05/17/2014   CLINICAL DATA:  ALTERED MENTAL STATUS ALTERED MENTAL STATUS  EXAM: CHEST  2 VIEW  COMPARISON:  Prior radiograph from 07/05/2013  FINDINGS: Right-sided Port-A-Cath in place with tip overlying the mid SVC. Cardiomegaly is grossly stable. Mediastinal silhouette within normal limits.  The lungs are normally inflated. No airspace consolidation or pulmonary edema is identified. Irregular pleural thickening along the lateral right lung related to history of multiple myeloma is grossly similar. There is no pneumothorax.  Changes of multiple myeloma with fracture and irregularity of the right posterior sixth rib is grossly stable from prior CT. No definite acute osseous abnormality.  IMPRESSION: 1. No active cardiopulmonary abnormality identified. 2. Similar changes related to multiple myeloma.   Electronically Signed   By: Jeannine Boga M.D.   On: 05/17/2014 14:00   Ct Head Wo Contrast  05/17/2014   CLINICAL DATA:  Altered mental status.  EXAM: CT HEAD WITHOUT CONTRAST  TECHNIQUE: Contiguous axial images were obtained from the base of the skull through the vertex without intravenous contrast.  COMPARISON:  Head CT scan 09/03/2012.  FINDINGS: Cortical atrophy is again seen. No evidence of acute abnormality including hemorrhage, infarct, mass lesion, mass effect, midline shift or abnormal extra-axial fluid collection is identified. There is no hydrocephalus or pneumocephalus. The calvarium is intact. Tiny lucencies in the calvarium consistent with history of multiple myeloma are noted. Tiny mucous retention cyst or polyp left sphenoid sinus is seen. Sinus disease seen on the prior examination is markedly improved.  IMPRESSION: No acute abnormality.  Improved sinus disease.   Electronically Signed   By: Inge Rise M.D.   On: 05/17/2014 12:37     ASSESSMENT / PLAN:  PULMONARY A:  At risk for respiratory failure - AMS,  generalized weakness Metabolic acidosis with respiratory alkalosis and AG of 29 CXR  6/30: mild atelectasis - productive cough, monitor for interval development of pna with hydration P:   IS/FV Hold home Lasix  F/u ABG later today @ 2000, and in the morning F/u CXR in AM F/u Sputum Cx Abx See ID Pulmonary hygiene O2  CARDIOVASCULAR A:  H/o Hypertension P:  Hold home clonidine, Nebivolol  Hold Lasix  RENAL A:   - UA with granular cast, few bacteria, nit +, leuk +, and protein 100 with 3-6 wbc -  ? suggests against myeloma cast nephropathy, ? Favors UTI  Metabolic acidosis with respiratory alkalosis and AG of 29 - likely uremia source in the presence of acute renal failure and UTI -possible RTA source d/t elevated delta gap P:   Bicarb gtt Fluid resuscitation Monitor electrolytes Assess FeNa and urine electrolytes Check uric acid Renal U/S Renal service to determine cause of renal failure - ? Non myeloma causes v myeloma causes like cast nephropathy   GASTROINTESTINAL A:   Diarrhea  Nausea H/o Gerd NPO - altered mental status P:    Zofran IV Pepcid IV Start D5 with 150 meq Bicarb after current bag given NPO status  HEMATOLOGIC/ONCOLOGY A:  Multiple Myeloma Stage IIIB -currently on treatment with Carfilzomib 56 mg/M2, Pomalyst 3 mg by mouth daily for 21 days every 3 weeks in addition to Decadron 40 mg on a weekly basis Coagulopathy - chronic coumadin in the presence of Multiple Myeloma  Elevated INR - 6, no active signs of bleeding, received 34m Vit K 6/29 in office P:  Will allow INR to drift down on own in the absence of active signs of bleeding or thrombocytopenia F/u INR F/u CBC  INFECTIOUS A:   UTI Productive cough P:   F/u blood cultures F/u Lactic acid Sputum cx Start Zosyn for immunocompromised UTI Ceftriaxone given x1 in ED  ENDOCRINE A:     Steroid induced hyperglycemia (weekly doses of decadron for MM) P:   SSI   NEUROLOGIC A:    Altered mental status in the presence of UTI and acute renal failure - negative head CT 6/30 Chronic Pain d/t Multiple Myeloma  P:   PRN IV fentanyl for pain (no morphine in renal failure due to increased risk for delirium) Hold PO morphine, norco, neurontin RASS goal: 0   GLOBAL: 05/17/14: Extensive discussion by NP with wife and mother at bedside regarding current clinical status and goals of care.  They have never had conversations regarding EOL.  He has stated in the past that he would not want another chest tube due to his experience with the empyema.  They are going to discuss CPR & mechanical ventilation.     TODAY'S SUMMARY:   JLalla BrothersACNP student BNoe Gens NP-C L[facility]Pulmonary [facility] Care Pgr: [phone] or [phone]/30/2015, 2:54 PM    STAFF NOTE: I, Dr MAnn Lionshave personally reviewed patient's available data, including medical history, events of note, physical examination and test results as part of my evaluation. I have discussed with resident/NP and other care providers such as pharmacist, RN and RRT.  In addition,  I personally evaluated patient and elicited key findings of  [facility]. Currently on bicarb gtt without hypo/hyperkalemia. Will need repeat BMP and ABG later today to follow-up on metabolic acidosis. Started Zosyn for UTI in an immunocompromised patient. F/u CXR after fluid resuscitation. Send some studies to determine cause of ARF .  Renal to help with further studies if  multiple myeloma playing a role  .  Rest per NP/medical resident whose note is outlined above and that I agree with  The patient is critically ill with multiple organ systems failure and requires high complexity decision making for assessment and support, frequent evaluation and titration of therapies, application of advanced monitoring technologies and extensive  interpretation of multiple databases.   Critical Care Time devoted to patient care services described in this note is  35 Minutes.  Dr. Brand Males, M.D., [facility].C.P Pulmonary and Critical Care Medicine Staff Physician [place] [facility] Pager: [phone], If no answer or between  15:00h - 7:00h: call [phone]  05/17/2014 4:53 PM

## 2014-05-17 NOTE — Telephone Encounter (Signed)
I spoke to [place] and she reports Jamyson is having a new problem -confusion . Per dr Julien Nordmann I instructed Otila Kluver to take pt to ED .

## 2014-05-18 ENCOUNTER — Inpatient Hospital Stay (HOSPITAL_COMMUNITY): Payer: 59

## 2014-05-18 ENCOUNTER — Ambulatory Visit: Payer: 59

## 2014-05-18 ENCOUNTER — Encounter: Payer: Self-pay | Admitting: Internal Medicine

## 2014-05-18 ENCOUNTER — Other Ambulatory Visit: Payer: 59

## 2014-05-18 DIAGNOSIS — R5383 Other fatigue: Secondary | ICD-10-CM

## 2014-05-18 DIAGNOSIS — N179 Acute kidney failure, unspecified: Principal | ICD-10-CM

## 2014-05-18 DIAGNOSIS — R5381 Other malaise: Secondary | ICD-10-CM

## 2014-05-18 DIAGNOSIS — C9002 Multiple myeloma in relapse: Secondary | ICD-10-CM

## 2014-05-18 LAB — KAPPA/LAMBDA LIGHT CHAINS
KAPPA FREE LGHT CHN: 1250 mg/dL — AB (ref 0.33–1.94)
KAPPA LAMBDA RATIO: 811.69 — AB (ref 0.26–1.65)
LAMBDA FREE LGHT CHN: 1.54 mg/dL (ref 0.57–2.63)

## 2014-05-18 LAB — CBC
HCT: 22.4 % — ABNORMAL LOW (ref 39.0–52.0)
HEMOGLOBIN: 6.9 g/dL — AB (ref 13.0–17.0)
MCH: 27.7 pg (ref 26.0–34.0)
MCHC: 30.8 g/dL (ref 30.0–36.0)
MCV: 90 fL (ref 78.0–100.0)
PLATELETS: 189 10*3/uL (ref 150–400)
RBC: 2.49 MIL/uL — ABNORMAL LOW (ref 4.22–5.81)
RDW: 20.1 % — ABNORMAL HIGH (ref 11.5–15.5)
WBC: 5.5 10*3/uL (ref 4.0–10.5)

## 2014-05-18 LAB — BLOOD GAS, ARTERIAL
Acid-base deficit: 1.8 mmol/L (ref 0.0–2.0)
Bicarbonate: 21 mEq/L (ref 20.0–24.0)
DRAWN BY: 308601
FIO2: 0.21 %
O2 SAT: 97.3 %
PCO2 ART: 26.4 mmHg — AB (ref 35.0–45.0)
Patient temperature: 97.1
TCO2: 21.8 mmol/L (ref 0–100)
pH, Arterial: 7.508 — ABNORMAL HIGH (ref 7.350–7.450)
pO2, Arterial: 80.2 mmHg (ref 80.0–100.0)

## 2014-05-18 LAB — BASIC METABOLIC PANEL
BUN: 39 mg/dL — AB (ref 6–23)
BUN: 41 mg/dL — ABNORMAL HIGH (ref 6–23)
CALCIUM: 6.9 mg/dL — AB (ref 8.4–10.5)
CHLORIDE: 94 meq/L — AB (ref 96–112)
CO2: 12 meq/L — AB (ref 19–32)
CO2: 26 mEq/L (ref 19–32)
Calcium: 7.3 mg/dL — ABNORMAL LOW (ref 8.4–10.5)
Chloride: 95 mEq/L — ABNORMAL LOW (ref 96–112)
Creatinine, Ser: 4.57 mg/dL — ABNORMAL HIGH (ref 0.50–1.35)
Creatinine, Ser: 4.94 mg/dL — ABNORMAL HIGH (ref 0.50–1.35)
GFR calc Af Amer: 14 mL/min — ABNORMAL LOW (ref >90)
GFR calc Af Amer: 16 mL/min — ABNORMAL LOW (ref >90)
GFR calc non Af Amer: 14 mL/min — ABNORMAL LOW (ref >90)
GFR, EST NON AFRICAN AMERICAN: 12 mL/min — AB (ref >90)
Glucose, Bld: 138 mg/dL — ABNORMAL HIGH (ref 70–99)
Glucose, Bld: 162 mg/dL — ABNORMAL HIGH (ref 70–99)
POTASSIUM: 3.4 meq/L — AB (ref 3.7–5.3)
Potassium: 4.1 mEq/L (ref 3.7–5.3)
SODIUM: 147 meq/L (ref 137–147)
Sodium: 143 mEq/L (ref 137–147)

## 2014-05-18 LAB — IGG, IGA, IGM
IGA: 42 mg/dL — AB (ref 68–379)
IgG (Immunoglobin G), Serum: 473 mg/dL — ABNORMAL LOW (ref 650–1600)
IgM, Serum: 36 mg/dL — ABNORMAL LOW (ref 41–251)

## 2014-05-18 LAB — LACTIC ACID, PLASMA: LACTIC ACID, VENOUS: 3.1 mmol/L — AB (ref 0.5–2.2)

## 2014-05-18 LAB — GLUCOSE, CAPILLARY
GLUCOSE-CAPILLARY: 134 mg/dL — AB (ref 70–99)
GLUCOSE-CAPILLARY: 155 mg/dL — AB (ref 70–99)
GLUCOSE-CAPILLARY: 147 mg/dL — AB (ref 70–99)
GLUCOSE-CAPILLARY: 128 mg/dL — AB (ref 70–99)
Glucose-Capillary: 121 mg/dL — ABNORMAL HIGH (ref 70–99)
Glucose-Capillary: 134 mg/dL — ABNORMAL HIGH (ref 70–99)
Glucose-Capillary: 139 mg/dL — ABNORMAL HIGH (ref 70–99)

## 2014-05-18 LAB — PHOSPHORUS: PHOSPHORUS: 5.2 mg/dL — AB (ref 2.3–4.6)

## 2014-05-18 LAB — MAGNESIUM: Magnesium: 1.6 mg/dL (ref 1.5–2.5)

## 2014-05-18 LAB — PROTIME-INR
INR: 5.47 (ref 0.00–1.49)
Prothrombin Time: 49.8 seconds — ABNORMAL HIGH (ref 11.6–15.2)

## 2014-05-18 LAB — PREPARE RBC (CROSSMATCH)

## 2014-05-18 LAB — BETA 2 MICROGLOBULIN, SERUM: Beta-2 Microglobulin: 41.4 mg/L — ABNORMAL HIGH (ref <=2.51)

## 2014-05-18 MED ORDER — POTASSIUM CHLORIDE CRYS ER 20 MEQ PO TBCR
20.0000 meq | EXTENDED_RELEASE_TABLET | Freq: Once | ORAL | Status: AC
  Administered 2014-05-18: 20 meq via ORAL
  Filled 2014-05-18: qty 1

## 2014-05-18 MED ORDER — MUPIROCIN 2 % EX OINT
1.0000 "application " | TOPICAL_OINTMENT | Freq: Two times a day (BID) | CUTANEOUS | Status: DC
  Administered 2014-05-18 – 2014-05-20 (×6): 1 via NASAL
  Filled 2014-05-18: qty 22

## 2014-05-18 MED ORDER — CHLORHEXIDINE GLUCONATE CLOTH 2 % EX PADS
6.0000 | MEDICATED_PAD | Freq: Every day | CUTANEOUS | Status: DC
  Administered 2014-05-18 – 2014-05-20 (×3): 6 via TOPICAL

## 2014-05-18 MED ORDER — BIOTENE DRY MOUTH MT LIQD
15.0000 mL | Freq: Two times a day (BID) | OROMUCOSAL | Status: DC
  Administered 2014-05-18 – 2014-05-20 (×6): 15 mL via OROMUCOSAL

## 2014-05-18 MED ORDER — SODIUM CHLORIDE 0.45 % IV SOLN
INTRAVENOUS | Status: DC
  Administered 2014-05-18: 10 mL/h via INTRAVENOUS

## 2014-05-18 NOTE — Progress Notes (Signed)
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  Renal function nl 04/2014 with Cr 0.7-->2.9 on 05/16/14--> Renal US unremarkable.  Kappa free light chains1250.00.    Pt without pain this AM.  Very disoriented/confused.  Wife at bedside.    O:BP 108/62  Pulse 87  Temp(Src) 98.9 F (37.2 C) (Oral)  Resp 23  Ht '5\' 4"'  (1.626 m)  Wt 211 lb 13.8 oz (96.1 kg)  BMI 36.35 kg/m2  SpO2 98%  Intake/Output Summary (Last 24 hours) at 05/18/14 0907 Last data filed at 05/18/14 0800  Gross per 24 hour  Intake 1987.5 ml  Output    150 ml  Net 1837.5 ml   Intake/Output: I/O last 3 completed shifts: In: 1862.5 [I.V.:1712.5; IV Piggyback:150] Out: 150 [Urine:150] +1.7L 6/30   Intake/Output this shift:  Total I/O In: 125 [I.V.:125] Out: -  Weight change:  +3 lbs.  Gen:NAD, alert but disoriented  CVS:RRR Resp:CTAB JGG:EZMOQ, soft, NT/ND, +Bs HUT:MLYY, dry, no LE edema   Recent Labs Lab 05/16/14 0814 05/17/14 1258 05/17/14 1312 05/18/14 0017  NA 142 143 140 143  K 3.5 4.6 4.3 4.1  CL  --  94* 106 94*  CO2 18* 8*  --  12*  GLUCOSE 72 107* 96 138*  BUN 15.2 35* 30* 39*  CREATININE 2.9* 4.95* 5.50* 4.57*  ALBUMIN 2.8* 2.9*  --   --   CALCIUM 8.7 7.9*  --  7.3*  AST 38* 37  --   --   ALT 11 10  --   --    Liver Function Tests:  Recent Labs Lab 05/16/14 0814 05/17/14 1258  AST 38* 37  ALT 11 10  ALKPHOS 149 154*  BILITOT 0.96 0.5  PROT 5.9* 6.3  ALBUMIN 2.8* 2.9*   No results found for this basename: LIPASE, AMYLASE,  in the last 168 hours No results found for this basename: AMMONIA,  in the last 168 hours CBC:  Recent Labs Lab 05/16/14 0813  05/17/14 1258 05/17/14 1312 05/17/14 2130 05/18/14 0827  WBC 6.3  --  6.9  --  7.1 5.5  NEUTROABS 3.7  --  5.4  --   --   --   HGB 9.9*  < > 9.1* 13.3 8.0* 6.9*  HCT 32.7*  < > 29.6* 39.0 25.9* 22.4*  MCV 91.6  --  91.4  --   90.6 90.0  PLT 184  --  247  --  234 189  < > = values in this interval not displayed. Cardiac Enzymes: No results found for this basename: CKTOTAL, CKMB, CKMBINDEX, TROPONINI,  in the last 168 hours CBG:  Recent Labs Lab 05/17/14 1633 05/17/14 2023 05/17/14 2359 05/18/14 0411 05/18/14 0836  GLUCAP 102* 129* 139* 134* 155*    Iron Studies: No results found for this basename: IRON, TIBC, TRANSFERRIN, FERRITIN,  in the last 72 hours Studies/Results: Dg Chest 2 View  05/17/2014   CLINICAL DATA:  ALTERED MENTAL STATUS ALTERED MENTAL STATUS  EXAM: CHEST  2 VIEW  COMPARISON:  Prior radiograph from 07/05/2013  FINDINGS: Right-sided Port-A-Cath in place with tip overlying the mid SVC. Cardiomegaly is grossly stable. Mediastinal silhouette within normal limits.  The lungs are normally inflated. No airspace consolidation or pulmonary edema is identified. Irregular pleural thickening along the lateral right lung related to history of multiple myeloma is grossly similar. There is no pneumothorax.  Changes of multiple myeloma  with fracture and irregularity of the right posterior sixth rib is grossly stable from prior CT. No definite acute osseous abnormality.  IMPRESSION: 1. No active cardiopulmonary abnormality identified. 2. Similar changes related to multiple myeloma.   Electronically Signed   By: Jeannine Boga M.D.   On: 05/17/2014 14:00   Ct Head Wo Contrast  05/17/2014   CLINICAL DATA:  Altered mental status.  EXAM: CT HEAD WITHOUT CONTRAST  TECHNIQUE: Contiguous axial images were obtained from the base of the skull through the vertex without intravenous contrast.  COMPARISON:  Head CT scan 09/03/2012.  FINDINGS: Cortical atrophy is again seen. No evidence of acute abnormality including hemorrhage, infarct, mass lesion, mass effect, midline shift or abnormal extra-axial fluid collection is identified. There is no hydrocephalus or pneumocephalus. The calvarium is intact. Tiny lucencies in the  calvarium consistent with history of multiple myeloma are noted. Tiny mucous retention cyst or polyp left sphenoid sinus is seen. Sinus disease seen on the prior examination is markedly improved.  IMPRESSION: No acute abnormality.  Improved sinus disease.   Electronically Signed   By: Inge Rise M.D.   On: 05/17/2014 12:37   US Renal  05/17/2014   CLINICAL DATA:  Renal insufficiency. Multiple myeloma with chemotherapy ongoing.  EXAM: RENAL/URINARY TRACT ULTRASOUND COMPLETE  COMPARISON:  Renal ultrasound 02/03/2013.  FINDINGS: Examination is mildly limited by body habitus, bowel gas and patient condition.  Right Kidney:  Length: 13.0 cm. Echogenicity within normal limits. No mass or hydronephrosis visualized.  Left Kidney:  Length: 12.8 cm. Echogenicity within normal limits. No mass or hydronephrosis visualized.  Bladder:  Nearly empty. Ureteral jets could not be adequately evaluated due to patient condition.  IMPRESSION: Both kidneys are normal in size without hydronephrosis. No definite acute abnormalities identified with limitations as noted above.   Electronically Signed   By: Camie Patience M.D.   On: 05/17/2014 17:44   Dg Chest Port 1 View  05/18/2014   CLINICAL DATA:  Multiple myeloma and hypertension  EXAM: PORTABLE CHEST - 1 VIEW  COMPARISON:  May 17, 2014  FINDINGS: Port-A-Cath tip is in the superior vena cava, stable. No pneumothorax. There is no edema or consolidation. There is stable asymmetric pleural thickening in the right apex region. Heart is mildly enlarged with pulmonary vascularity within normal limits. There is no appreciable adenopathy. There is a stable fracture of the right sixth rib with lytic change in the right sixth and seventh ribs consistent with known multiple myeloma.  IMPRESSION: No edema or consolidation. No change in cardiac silhouette. Bony changes indicative of multiple myeloma on the right, stable. Stable asymmetric right apical and apicolateral pleural thickening.    Electronically Signed   By: Lowella Grip M.D.   On: 05/18/2014 07:24   . antiseptic oral rinse  15 mL Mouth Rinse BID  . Chlorhexidine Gluconate Cloth  6 each Topical Q0600  . famotidine (PEPCID) IV  20 mg Intravenous Q12H  . insulin aspart  0-24 Units Subcutaneous 6 times per day  . lidocaine-prilocaine   Topical Once  . mupirocin ointment  1 application Nasal BID  . phytonadione  10 mg Subcutaneous BID  . piperacillin-tazobactam (ZOSYN)  IV  2.25 g Intravenous 3 times per day    BMET    Component Value Date/Time   NA 143 05/18/2014 0017   NA 142 05/16/2014 0814   K 4.1 05/18/2014 0017   K 3.5 05/16/2014 0814   CL 94* 05/18/2014 0017   CL 112* 05/10/2013  0849   CO2 12* 05/18/2014 0017   CO2 18* 05/16/2014 0814   GLUCOSE 138* 05/18/2014 0017   GLUCOSE 72 05/16/2014 0814   GLUCOSE 94 05/10/2013 0849   BUN 39* 05/18/2014 0017   BUN 15.2 05/16/2014 0814   CREATININE 4.57* 05/18/2014 0017   CREATININE 2.9* 05/16/2014 0814   CALCIUM 7.3* 05/18/2014 0017   CALCIUM 8.7 05/16/2014 0814   GFRNONAA 14* 05/18/2014 0017   GFRAA 16* 05/18/2014 0017   CBC    Component Value Date/Time   WBC 5.5 05/18/2014 0827   WBC 6.3 05/16/2014 0813   RBC 2.49* 05/18/2014 0827   RBC 3.57* 05/16/2014 0813   HGB 6.9* 05/18/2014 0827   HGB 9.9* 05/16/2014 0813   HCT 22.4* 05/18/2014 0827   HCT 32.7* 05/16/2014 0813   PLT 189 05/18/2014 0827   PLT 184 05/16/2014 0813   MCV 90.0 05/18/2014 0827   MCV 91.6 05/16/2014 0813   MCH 27.7 05/18/2014 0827   MCH 27.7 05/16/2014 0813   MCHC 30.8 05/18/2014 0827   MCHC 30.3* 05/16/2014 0813   RDW 20.1* 05/18/2014 0827   RDW 19.6* 05/16/2014 0813   LYMPHSABS 0.8 05/17/2014 1258   LYMPHSABS 1.4 05/16/2014 0813   MONOABS 0.6 05/17/2014 1258   MONOABS 1.0* 05/16/2014 0813   EOSABS 0.0 05/17/2014 1258   EOSABS 0.1 05/16/2014 0813   BASOSABS 0.1 05/17/2014 1258   BASOSABS 0.2* 05/16/2014 0813    Assessment/Plan:  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  I have known Mr. Obenchain over the last several years and he has had good kidney function until recently.  I discussed his QOL and overall functional status with Mrs. Pro and also tried to discuss the issue of dialysis with Mr. Lograsso however he was still delirious today.  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  We discussed palliative care and she is amenable to speaking with them as I fear chronic dialysis would further lower his QOL and would not impact on his overall survival (although he has done well over the last 7 years with many different therapies). Quintasha Gren A,MD 05/18/2014 1:38 PM

## 2014-05-18 NOTE — Progress Notes (Signed)
Principle Diagnosis:  #1 recurrent multiple myeloma IgG kappa subtype diagnosed in June of 2008  #2 history of vasculitis and thrombosis of skin lesions   Prior Therapy:  #1 status post palliative radiotherapy to the left hip under the care of Dr. Sondra Come  #2 status post 5 cycles of systemic chemotherapy with Revlimid and low dose Decadron. Last dose given June 2009 with good response.  #3 status post autologous peripheral blood stem cell transplant at [facility] on 02/02/2008.  #4 the patient had evidence for disease recurrence in December 2010.  #5 Revlimid 25 mg by mouth daily for 21 days every 4 weeks in addition to Decadron 40 mg orally on a weekly basis. The patient is status post 28 cycles, discontinued today secondary to disease progression.  #6 Systemic chemotherapy with Velcade 1.3 mg/M2 on days 1, 4, 8 and 11 in addition to Doxil 30 mg/M2 on day 4 and Decadron 40 mg by mouth on weekly basis every 3 weeks. Status post 3 cycles, last dose was given 05/04/2012 discontinued secondary to intolerance.  #7 Salvage therapy treatment with the Pomalyst 4 mg by mouth daily for 21 days every 28 days as well as Cytoxan 50 mg by mouth every other day and dexamethasone 40 mg by mouth once weekly. Therapy beginning 09/25/2012, status post 2 cycles discontinued secondary to disease progression and intolerance.  #8 Kyprolis (Carfilzomib) 27 mg/M2 on days 1, 2, 8, 9, 15 and 16 every 4 weeks. First dose on 02/08/2013. This would be concurrent with the dexamethasone 40 mg by mouth on a weekly basis. Status post 3 cycles with disease progression.  #9 Systemic chemotherapy with Carfilzomib 27 mg/M2 days 1,2 , 8, 9, 15 and 16, Cytoxan 300 mg/M2 and dexamethasone 40 mg by mouth weekly every 4 weeks. Status post 4 cycles. Last dose was given 08/09/2013 discontinued today secondary to disease progression.   Current therapy:  1) Systemic chemotherapy with Carfilzomib 56 mg/M2 days 1,2 , 8, 9, 15 and 16, Pomalyst  3 mg by mouth daily for 21 days every 4 weeks and dexamethasone 40 mg by mouth weekly every 4 weeks. First cycle started on 10/05/2013. The patient missed day 8 and 9 of the first cycle secondary to thrombocytopenia. Status post 8 cycles. This is now discontinued secondary to disease progression. 2) Zometa 4 mg IV given every 3 months.    Subjective: The patient is seen and examined this morning. He is still very weak and tired with very weak voice. The result of Myeloma panel performed 2 days ago is now available and showed significant disease progression with very high free kappa light chain which probably the cause of his worsening renal function.   Objective: Vital signs in last 24 hours: Temp:  [97.5 F (36.4 C)-98.4 F (36.9 C)] 97.5 F (36.4 C) (07/01 0411) Pulse Rate:  [79-86] 86 (07/01 0700) Resp:  [14-26] 14 (07/01 0700) BP: (101-122)/(52-65) 119/59 mmHg (07/01 0700) SpO2:  [97 %-100 %] 97 % (07/01 0700) Weight:  [208 lb 8.9 oz (94.6 kg)-211 lb 13.8 oz (96.1 kg)] 211 lb 13.8 oz (96.1 kg) (07/01 0500)  Intake/Output from previous day: 06/30 0701 - 07/01 0700 In: 1737.5 [I.V.:1587.5; IV Piggyback:150] Out: 150 [Urine:150] Intake/Output this shift:    General appearance: alert, cooperative, fatigued and no distress Resp: clear to auscultation bilaterally Cardio: regular rate and rhythm, S1, S2 normal, no murmur, click, rub or gallop GI: soft, non-tender; bowel sounds normal; no masses,  no organomegaly Extremities: extremities normal,  atraumatic, no cyanosis or edema  Lab Results:   Recent Labs  05/17/14 1258 05/17/14 1312 05/17/14 2130  WBC 6.9  --  7.1  HGB 9.1* 13.3 8.0*  HCT 29.6* 39.0 25.9*  PLT 247  --  234   BMET  Recent Labs  05/17/14 1258 05/17/14 1312 05/18/14 0017  NA 143 140 143  K 4.6 4.3 4.1  CL 94* 106 94*  CO2 8*  --  12*  GLUCOSE 107* 96 138*  BUN 35* 30* 39*  CREATININE 4.95* 5.50* 4.57*  CALCIUM 7.9*  --  7.3*     Studies/Results: Dg Chest 2 View  05/17/2014   CLINICAL DATA:  ALTERED MENTAL STATUS ALTERED MENTAL STATUS  EXAM: CHEST  2 VIEW  COMPARISON:  Prior radiograph from 07/05/2013  FINDINGS: Right-sided Port-A-Cath in place with tip overlying the mid SVC. Cardiomegaly is grossly stable. Mediastinal silhouette within normal limits.  The lungs are normally inflated. No airspace consolidation or pulmonary edema is identified. Irregular pleural thickening along the lateral right lung related to history of multiple myeloma is grossly similar. There is no pneumothorax.  Changes of multiple myeloma with fracture and irregularity of the right posterior sixth rib is grossly stable from prior CT. No definite acute osseous abnormality.  IMPRESSION: 1. No active cardiopulmonary abnormality identified. 2. Similar changes related to multiple myeloma.   Electronically Signed   By: Jeannine Boga M.D.   On: 05/17/2014 14:00   Ct Head Wo Contrast  05/17/2014   CLINICAL DATA:  Altered mental status.  EXAM: CT HEAD WITHOUT CONTRAST  TECHNIQUE: Contiguous axial images were obtained from the base of the skull through the vertex without intravenous contrast.  COMPARISON:  Head CT scan 09/03/2012.  FINDINGS: Cortical atrophy is again seen. No evidence of acute abnormality including hemorrhage, infarct, mass lesion, mass effect, midline shift or abnormal extra-axial fluid collection is identified. There is no hydrocephalus or pneumocephalus. The calvarium is intact. Tiny lucencies in the calvarium consistent with history of multiple myeloma are noted. Tiny mucous retention cyst or polyp left sphenoid sinus is seen. Sinus disease seen on the prior examination is markedly improved.  IMPRESSION: No acute abnormality.  Improved sinus disease.   Electronically Signed   By: Inge Rise M.D.   On: 05/17/2014 12:37   US Renal  05/17/2014   CLINICAL DATA:  Renal insufficiency. Multiple myeloma with chemotherapy ongoing.  EXAM:  RENAL/URINARY TRACT ULTRASOUND COMPLETE  COMPARISON:  Renal ultrasound 02/03/2013.  FINDINGS: Examination is mildly limited by body habitus, bowel gas and patient condition.  Right Kidney:  Length: 13.0 cm. Echogenicity within normal limits. No mass or hydronephrosis visualized.  Left Kidney:  Length: 12.8 cm. Echogenicity within normal limits. No mass or hydronephrosis visualized.  Bladder:  Nearly empty. Ureteral jets could not be adequately evaluated due to patient condition.  IMPRESSION: Both kidneys are normal in size without hydronephrosis. No definite acute abnormalities identified with limitations as noted above.   Electronically Signed   By: Camie Patience M.D.   On: 05/17/2014 17:44    Medications: I have reviewed the patient's current medications.  Assessment/Plan: 1) Progressive Multiple Myeloma: He was diagnosed in 2008 and underwent several Chemotherapy regimens including peripheral blood SCT. Most recently treated with Carfilzomib, Pomalyst and Decadron but unfortunately his recent Myeloma panel showed evidence for significant disease progression. The patient has very poor prognosis but we were able to keep him alive with newer chemotherapy regimens. Currently there is no standard treatment options but there  is a clinical trial with a newer agent Elotuzumab that may be an option for him if he gets a little bit better. It looks like his wife is giving up based on my discussion with Dr. Guadalupe Dawn but I will call her later today to figure out if they are interested in further treatment or refer him to Hospice. 2) Renal failure: most likely secondary to progression of his Myeloma. He may benefit from short course of Plasmaphoresis if they are interested in considering further chemotherapy, otherwise palliative care and hospice should be considered. Thank you for taking good care of Mr. Brigante, I will continue to follow up with you and assist in his management as needed.  LOS: 1 day     Avyon Herendeen K. 05/18/2014

## 2014-05-18 NOTE — Progress Notes (Signed)
UR Completed.  [name] .tele 05/18/2014

## 2014-05-18 NOTE — Progress Notes (Signed)
Called and spoke with [id number] and she sent me to [facility] and per Sam, they don't go back it will be for copays going forward after approval date. The bals from Jan would not qualify.

## 2014-05-18 NOTE — Progress Notes (Signed)
Spoke with Dr. Halford Chessman in e-link. Pt. Sounds wet despite maintaining sats on room air. Will give each unit of FFP and blood over 2 hours due to the fact patient is not voiding and volume status is an issue. Dr. Halford Chessman is aware and is fine with the slow rate of transfusion. No further bleeding noted. VSS

## 2014-05-18 NOTE — Progress Notes (Signed)
Pt's lungs sound coarse. No UO this shift and bladder scan shows no urine. Dr. Leonidas Romberg called and new orders to be carried out.

## 2014-05-18 NOTE — Progress Notes (Signed)
INITIAL NUTRITION ASSESSMENT  DOCUMENTATION CODES Per approved criteria  -Obesity Unspecified -Severe malnutrition in the context of chronic illness   INTERVENTION:  Diet advancement per MD as mental status improves. Recommend regular diet with supplements TID (Ensure Complete or Resource Breeze).  NUTRITION DIAGNOSIS: Inadequate oral intake related to altered mental status as evidenced by NPO status.   Goal: Intake to meet >90% of estimated nutrition needs.  Monitor:  Diet advancement, PO intake, labs, weight trend.  Reason for Assessment: MST  53 y.o. male  Admitting Dx: Confusion, UTI, metabolic acidosis  ASSESSMENT: 53 y.o. Male with h/o multiple myeloma (Stage IIIB) who presented to [facility] ED on 6/30 with a productive cough, altered mental status and slurred speech without focal deficits. Considered to have ARF, secondary to suspected Multiple Myeloma kidney injury and pre-renal hypovolemic ARF, UTI, and acute high AG metabolic acidosis with respiratory alkalosis. [facility] from [facility] to [facility] on 6/30.   Nutrition focused physical exam completed.  No muscle or subcutaneous fat depletion noticed. Patient somewhat confused during RD visit; he reports that he has been eating poorly, but unable to elaborate.  Per discussion with RN, patient's wife reported that patient has had a lot of nausea and poor appetite with very poor oral intake over the past month. He has lost ~16 lbs over the past month.   Oncology recommending plasmapheresis. Palliative Care Team has been consulted for family meeting to determine goals of care for patient.   Pt meets criteria for severe MALNUTRITION in the context of chronic illness as evidenced by intake </= 75% of estimated energy requirement for >/= one month with 7% weight loss in the past month.   Height: Ht Readings from Last 1 Encounters:  05/18/14 '5\' 4"'  (1.626 m)    Weight: Wt Readings from Last 1 Encounters:  05/18/14 211 lb 13.8 oz (96.1  kg)    Ideal Body Weight: 59.1 kg  % Ideal Body Weight: 163%  Wt Readings from Last 10 Encounters:  05/18/14 211 lb 13.8 oz (96.1 kg)  05/16/14 210 lb 1.6 oz (95.301 kg)  04/26/14 227 lb 4.8 oz (103.103 kg)  04/19/14 224 lb 8 oz (101.833 kg)  04/18/14 220 lb 14.4 oz (100.2 kg)  04/04/14 230 lb 1.6 oz (104.373 kg)  03/31/14 236 lb 4.8 oz (107.185 kg)  03/21/14 227 lb 12.8 oz (103.329 kg)  03/07/14 244 lb (110.678 kg)  02/21/14 227 lb 6.4 oz (103.148 kg)    Usual Body Weight: 227 lb (1 month ago)  % Usual Body Weight: 93%  BMI:  Body mass index is 36.35 kg/(m^2). obesity, class 2  Estimated Nutritional Needs: Kcal: 1800-2000 Protein: 95-115 gm Fluid: 1.8-2 L  Skin: intact  Diet Order: NPO  EDUCATION NEEDS: -No education needs identified at this time   Intake/Output Summary (Last 24 hours) at 05/18/14 1330 Last data filed at 05/18/14 1300  Gross per 24 hour  Intake 3077.5 ml  Output      0 ml  Net 3077.5 ml    Last BM: 6/29   Labs:   Recent Labs Lab 05/17/14 1258 05/17/14 1312 05/18/14 0017 05/18/14 0827  NA 143 140 143 147  K 4.6 4.3 4.1 3.4*  CL 94* 106 94* 95*  CO2 8*  --  12* 26  BUN 35* 30* 39* 41*  CREATININE 4.95* 5.50* 4.57* 4.94*  CALCIUM 7.9*  --  7.3* 6.9*  MG  --   --   --  1.6  PHOS  --   --   --  5.2*  GLUCOSE 107* 96 138* 162*    CBG (last 3)   Recent Labs  05/18/14 0411 05/18/14 0836 05/18/14 1153  GLUCAP 134* 155* 147*    Scheduled Meds: . antiseptic oral rinse  15 mL Mouth Rinse BID  . Chlorhexidine Gluconate Cloth  6 each Topical Q0600  . famotidine (PEPCID) IV  20 mg Intravenous Q12H  . insulin aspart  0-24 Units Subcutaneous 6 times per day  . lidocaine-prilocaine   Topical Once  . mupirocin ointment  1 application Nasal BID  . phytonadione  10 mg Subcutaneous BID  . piperacillin-tazobactam (ZOSYN)  IV  2.25 g Intravenous 3 times per day    Continuous Infusions: . sodium chloride 75 mL/hr (05/18/14 1024)     Past Medical History  Diagnosis Date  . Multiple myeloma 09/24/2011  . Hypertension   . History of radiation therapy 07/09/2007-07/30/07    3000 cGy to left pelvis and proximal femur    Past Surgical History  Procedure Laterality Date  . Video bronchoscopy  12/11/2012    Procedure: VIDEO BRONCHOSCOPY;  Surgeon: Ivin Poot, MD;  Location: [place];  Service: Thoracic;  Laterality: N/A;  . Video assisted thoracoscopy  12/11/2012    Procedure: VIDEO ASSISTED THORACOSCOPY;  Surgeon: Ivin Poot, MD;  Location: [place];  Service: Thoracic;  Laterality: Right;  . Decortication  12/11/2012    Procedure: [id number];  Surgeon: Ivin Poot, MD;  Location: [place];  Service: Thoracic;  Laterality: N/A;    Molli Barrows, [place], [place], [place] Pager [phone] After Hours Pager [phone]

## 2014-05-18 NOTE — Progress Notes (Signed)
PULMONARY / CRITICAL CARE MEDICINE   Name: [name] MRN: [id number] DOB: 1961-03-01    ADMISSION DATE:  05/17/2014 CONSULTATION DATE:  05/17/14  REFERRING MD :  [facility] ED PRIMARY SERVICE: [facility] ED -> PCCM  CHIEF COMPLAINT: Confusion, UTI, metabolic acidosis  BRIEF PATIENT DESCRIPTION: 53 y.o. Male with h/o multiple myeloma (Stage IIIB) - on empiric anticoagulation, hx of chest tube for empyema, htn, and arteritis who presented to [facility]- ED on 6/30 with a productive cough, altered mental status and slurred speech without focal deficits which began at 0500. Considered to have ARF, secondary to suspected Multiple Myeloma kidney injury and pre-renal hypovolemic ARF, UTI, and acute high AG metabolic acidosis with respiratory alkalosis. Transferred from [facility] to [place].  SIGNIFICANT EVENTS / STUDIES:  6/30: Arrived to [facility]-ED and transferred to [facility] for acute renal failure 6/30: Head CT: no acute, improved sinus disease, Renal US (nml) 6/30: Foley unsuccessful with bleeding, bladder scan (minimal volume), LA 3.7 7/1: Hgb 8.0 >> 6.9, improved met acidosis, Onc suspect MM, recommend plasmaphoresis   LINES / TUBES: PIVs (6/30 Foley - unsuccessful)  CULTURES: Blood cultures 6/30 >> NGTD Sputum Culture >>> not collected Urine culture 6/30 >>>  ANTIBIOTICS: Ceftriaxone 6/30 x1 Zosyn 6/30 >>  SUBJECTIVE: Patient remains altered. Improved coherent speech, disoriented / inappropriate. Wife at bedside states mental status significantly improved from yesterday (previously more somnolent with minimal speech).  VITAL SIGNS: Temp:  [97.5 F (36.4 C)-98.4 F (36.9 C)] 97.5 F (36.4 C) (07/01 0411) Pulse Rate:  [79-86] 86 (07/01 0700) Resp:  [14-26] 14 (07/01 0700) BP: (101-122)/(52-65) 119/59 mmHg (07/01 0700) SpO2:  [97 %-100 %] 97 % (07/01 0700) Weight:  [208 lb 8.9 oz (94.6 kg)-211 lb 13.8 oz (96.1 kg)] 211 lb 13.8 oz (96.1 kg) (07/01 0500) RA    INTAKE / OUTPUT: Intake/Output      06/30 0701 - 07/01 0700 07/01 0701 - 07/02 0700   I.V. 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, right exterior thoracotomy scar  LABS:  PULMONARY  Recent Labs Lab 05/17/14 1237 05/17/14 1312 05/17/14 1621 05/18/14 0410  PHART 7.319*  --  7.339* 7.508*  PCO2ART 15.5*  --  18.9* 26.4*  PO2ART 107.0*  --  55.1* 80.2  HCO3 7.7*  --  9.9* 21.0  TCO2 7.4 9 9.3 21.8  O2SAT 96.9  --  82.7 97.3    CBC  Recent Labs Lab 05/16/14 0813 05/17/14 1258 05/17/14 1312 05/17/14 2130  HGB 9.9* 9.1* 13.3 8.0*  HCT 32.7* 29.6* 39.0 25.9*  WBC 6.3 6.9  --  7.1  PLT 184 247  --  234    COAGULATION  Recent Labs Lab 05/16/14 05/16/14 0813 05/16/14 0814 05/17/14 1258 05/17/14 2130  INR >10 Sent out for confirmation >10.00* 6.62* 7.79*    CARDIAC  No results found for this basename: TROPONINI,  in the last 168 hours  Recent Labs Lab 05/17/14 1342  PROBNP 8040.0*     CHEMISTRY  Recent Labs Lab 05/16/14 0814  05/17/14 1258 05/17/14 1312 05/18/14 0017  NA 142  --  143 140 143  K 3.5  < > 4.6 4.3 4.1  CL  --   --  94* 106 94*  CO2 18*  --  8*  --  12*  GLUCOSE 72  --  107* 96 138*  BUN 15.2  --  35* 30* 39*  CREATININE 2.9*  --  4.95* 5.50* 4.57*  CALCIUM 8.7  --  7.9*  --  7.3*  < > = values in this interval not displayed. Estimated Creatinine Clearance: 20 ml/min (by C-G formula based on Cr of  4.57).   LIVER  Recent Labs Lab 05/16/14 05/16/14 0813 05/16/14 0814 05/17/14 1258 05/17/14 2130  AST  --   --  38* 37  --   ALT  --   --  11 10  --   ALKPHOS  --   --  149 154*  --   BILITOT  --   --  0.96 0.5  --   PROT  --   --  5.9* 6.3  --   ALBUMIN  --   --  2.8* 2.9*  --   INR >10 Sent out for confirmation >10.00* 6.62* 7.79*     INFECTIOUS  Recent Labs Lab 05/17/14 1348  LATICACIDVEN 3.23*     ENDOCRINE CBG (last 3)   Recent Labs  05/17/14 2023 05/17/14 2359 05/18/14 0411  GLUCAP 129* 139* 134*         IMAGING x48h  Dg Chest 2 View  05/17/2014   CLINICAL DATA:  ALTERED MENTAL STATUS ALTERED MENTAL STATUS  EXAM: CHEST  2 VIEW  COMPARISON:  Prior radiograph from 07/05/2013  FINDINGS: Right-sided Port-A-Cath in place with tip overlying the mid SVC. Cardiomegaly is grossly stable. Mediastinal silhouette within normal limits.  The lungs are normally inflated. No airspace consolidation or pulmonary edema is identified. Irregular pleural thickening along the lateral right lung related to history of multiple myeloma is grossly similar. There is no pneumothorax.  Changes of multiple myeloma with fracture and irregularity of the right posterior sixth rib is grossly stable from prior CT. No definite acute osseous abnormality.  IMPRESSION: 1. No active cardiopulmonary abnormality identified. 2. Similar changes related to multiple myeloma.   Electronically Signed   By: Jeannine Boga M.D.   On: 05/17/2014 14:00   Ct Head Wo Contrast  05/17/2014   CLINICAL DATA:  Altered mental status.  EXAM: CT HEAD WITHOUT CONTRAST  TECHNIQUE: Contiguous axial images were obtained from the base of the skull through the vertex without intravenous contrast.  COMPARISON:  Head CT scan 09/03/2012.  FINDINGS: Cortical atrophy is again seen. No evidence of acute abnormality including hemorrhage, infarct, mass lesion, mass effect, midline shift or abnormal extra-axial fluid collection  is identified. There is no hydrocephalus or pneumocephalus. The calvarium is intact. Tiny lucencies in the calvarium consistent with history of multiple myeloma are noted. Tiny mucous retention cyst or polyp left sphenoid sinus is seen. Sinus disease seen on the prior examination is markedly improved.  IMPRESSION: No acute abnormality.  Improved sinus disease.   Electronically Signed   By: Inge Rise M.D.   On: 05/17/2014 12:37   US Renal  05/17/2014   CLINICAL DATA:  Renal insufficiency. Multiple myeloma with chemotherapy ongoing.  EXAM: RENAL/URINARY TRACT ULTRASOUND COMPLETE  COMPARISON:  Renal ultrasound 02/03/2013.  FINDINGS: Examination is mildly limited by body habitus, bowel gas and patient condition.  Right Kidney:  Length: 13.0 cm. Echogenicity within normal limits. No mass or hydronephrosis visualized.  Left Kidney:  Length: 12.8 cm. Echogenicity within normal limits. No mass or hydronephrosis visualized.  Bladder:  Nearly empty. Ureteral jets could not be adequately evaluated due to patient condition.  IMPRESSION: Both kidneys are normal in size without hydronephrosis. No definite acute abnormalities identified with limitations as noted above.   Electronically Signed   By: Camie Patience M.D.   On: 05/17/2014 17:44   Dg Chest Port 1 View  05/18/2014   CLINICAL DATA:  Multiple myeloma and hypertension  EXAM: PORTABLE CHEST - 1 VIEW  COMPARISON:  May 17, 2014  FINDINGS: Port-A-Cath tip is in the superior vena cava, stable. No pneumothorax. There is no edema or consolidation. There is stable asymmetric pleural thickening in the right apex region. Heart is mildly enlarged with pulmonary vascularity within normal limits. There is no appreciable adenopathy. There is a stable fracture of the right sixth rib with lytic change in the right sixth and seventh ribs consistent with known multiple myeloma.  IMPRESSION: No edema or consolidation. No change in cardiac silhouette. Bony changes indicative of  multiple myeloma on the right, stable. Stable asymmetric right apical and apicolateral pleural thickening.   Electronically Signed   By: Lowella Grip M.D.   On: 05/18/2014 07:24     ASSESSMENT / PLAN:  PULMONARY A:  At risk for respiratory failure - AMS, generalized weakness Metabolic acidosis (Bicarb 8 >> 12) with respiratory alkalosis and AG of 37 - Improved CXR 6/30: mild atelectasis - productive cough, monitor for interval development of pna with hydration P:   IS/FV Hold home Lasix  ABG reviewed, consider dc bicarb CXR consider int edema, repeat in am  Abx See ID Pulmonary hygiene O2 PRN (>97% on RA)  CARDIOVASCULAR A:  H/o Hypertension - stable P:  Hold home clonidine, Nebivolol  Hold Lasix  RENAL A:  - Oliguric, unsuccessful Foley (bladder scan w/o significant urine production) - UA suggestive of UTI, pending urine culture - overshoot bicarb, met alk - Urine studies / FeNa 0.16% (suggestive pre-renal) - Renal US (nml) -h/o vascultitis -at risk MM RTA  hypok P:   Bicarb gtt- dc, see abg and co2 Fluid resuscitation to 1/2 NS Monitor electrolytes, daily Renal Panel F/u Urine culture (6/30) Renal suspect low FeNa and history suggestive of MM kidney injury, may need oral bicarb Oncology agrees with MM kidney injury, recommend plasma phoresis - will discuss with family and place line if needed replace K  Consider further vasculitis labs after d/w oncology further Urology, foley  GASTROINTESTINAL A:   Diarrhea  Nausea H/o Gerd NPO - altered mental status P:    Zofran IV Pepcid IV Advance diet   HEMATOLOGIC/ONCOLOGY A:  Multiple Myeloma Stage IIIB -currently on treatment with Carfilzomib 56 mg/M2, Pomalyst 3 mg by mouth daily for 21 days every 3 weeks in addition to Decadron 40 mg on a weekly basis Coagulopathy - chronic coumadin in the presence of Multiple Myeloma  Elevated INR - 7.79, no active signs of bleeding, s/p 85m Vit K 6/29 Hgb 9.1 >> 13.3  >> 8.0 >> 6.9 (note acute bleed with attempted Foley insertion) P:  Vit K  F/u INR If active bleeding = FFP, especially with foley needed F/u CBC after 1 unit  INFECTIOUS A:   UTI Productive cough - CXR stable, no infiltrate Elevated Lactic  Acid 3.23 >> P:   F/u blood, urine cultures F/u Lactic acid (repeat on 7/1) Sputum cx Continue Zosyn (6/29>>) for immunocompromised UTI  ENDOCRINE A:     Steroid induced hyperglycemia (weekly doses of decadron for MM) P:   SSI   NEUROLOGIC A:   Altered mental status in the presence of UTI and acute renal failure - negative head CT 6/30 Chronic Pain d/t Multiple Myeloma  P:   PRN IV fentanyl for pain (no morphine in renal failure due to increased risk for delirium) Hold PO morphine, norco, neurontin RASS goal: 0   GLOBAL: Need to discuss plasmax change wishes  TODAY'S SUMMARY: Improving metabolic acidosis / resp alkalosis in setting of ARF with suspected ?MM intra-renal etiology vs volume depletion/pre-renal, dc bicarb gtt / fluid resuscitation, Renal following, plan to consult Oncology re: prognosis.  The patient is critically ill with multiple organ systems failure and requires high complexity decision making for assessment and support, frequent evaluation and titration of therapies, application of advanced monitoring technologies and extensive interpretation of multiple databases.   Critical Care Time devoted to patient care services described in this note is  35 Minutes.  Nobie Putnam, [place], PGY-1  05/18/2014 8:06 AM  I have fully examined this patient and agree with above findings.    And edited nifull  Lavon Paganini. Titus Mould, MD, [place] Pgr: [facility]

## 2014-05-19 DIAGNOSIS — Z66 Do not resuscitate: Secondary | ICD-10-CM | POA: Diagnosis not present

## 2014-05-19 DIAGNOSIS — Z515 Encounter for palliative care: Secondary | ICD-10-CM

## 2014-05-19 LAB — URINE CULTURE
COLONY COUNT: NO GROWTH
CULTURE: NO GROWTH

## 2014-05-19 LAB — CBC
HEMATOCRIT: 23.8 % — AB (ref 39.0–52.0)
HEMOGLOBIN: 7.6 g/dL — AB (ref 13.0–17.0)
MCH: 28.6 pg (ref 26.0–34.0)
MCHC: 31.9 g/dL (ref 30.0–36.0)
MCV: 89.5 fL (ref 78.0–100.0)
Platelets: 135 10*3/uL — ABNORMAL LOW (ref 150–400)
RBC: 2.66 MIL/uL — ABNORMAL LOW (ref 4.22–5.81)
RDW: 20.6 % — AB (ref 11.5–15.5)
WBC: 4.8 10*3/uL (ref 4.0–10.5)

## 2014-05-19 LAB — PREPARE FRESH FROZEN PLASMA
UNIT DIVISION: 0
UNIT DIVISION: 0
UNIT DIVISION: 0
Unit division: 0

## 2014-05-19 LAB — RENAL FUNCTION PANEL
ALBUMIN: 2.9 g/dL — AB (ref 3.5–5.2)
Anion gap: 23 — ABNORMAL HIGH (ref 5–15)
BUN: 44 mg/dL — ABNORMAL HIGH (ref 6–23)
CALCIUM: 6.8 mg/dL — AB (ref 8.4–10.5)
CO2: 29 mEq/L (ref 19–32)
CREATININE: 5.79 mg/dL — AB (ref 0.50–1.35)
Chloride: 97 mEq/L (ref 96–112)
GFR calc Af Amer: 12 mL/min — ABNORMAL LOW (ref >90)
GFR calc non Af Amer: 10 mL/min — ABNORMAL LOW (ref >90)
Glucose, Bld: 109 mg/dL — ABNORMAL HIGH (ref 70–99)
Phosphorus: 5.2 mg/dL — ABNORMAL HIGH (ref 2.3–4.6)
Potassium: 3.5 mEq/L — ABNORMAL LOW (ref 3.7–5.3)
Sodium: 149 mEq/L — ABNORMAL HIGH (ref 137–147)

## 2014-05-19 LAB — TYPE AND SCREEN
ABO/RH(D): O POS
Antibody Screen: NEGATIVE
DONOR AG TYPE: NEGATIVE
Unit division: 0

## 2014-05-19 LAB — GLUCOSE, CAPILLARY
GLUCOSE-CAPILLARY: 111 mg/dL — AB (ref 70–99)
GLUCOSE-CAPILLARY: 124 mg/dL — AB (ref 70–99)
Glucose-Capillary: 118 mg/dL — ABNORMAL HIGH (ref 70–99)

## 2014-05-19 LAB — PROTIME-INR
INR: 1.71 — AB (ref 0.00–1.49)
Prothrombin Time: 20.1 seconds — ABNORMAL HIGH (ref 11.6–15.2)

## 2014-05-19 LAB — LACTIC ACID, PLASMA: Lactic Acid, Venous: 2.3 mmol/L — ABNORMAL HIGH (ref 0.5–2.2)

## 2014-05-19 MED ORDER — BIOTENE DRY MOUTH MT LIQD
15.0000 mL | Freq: Two times a day (BID) | OROMUCOSAL | Status: DC
Start: 2014-05-19 — End: 2014-05-19

## 2014-05-19 MED ORDER — PANTOPRAZOLE SODIUM 40 MG IV SOLR
40.0000 mg | Freq: Two times a day (BID) | INTRAVENOUS | Status: DC
  Administered 2014-05-19 – 2014-05-20 (×2): 40 mg via INTRAVENOUS
  Filled 2014-05-19 (×3): qty 40

## 2014-05-19 MED ORDER — DIAZEPAM 5 MG/ML IJ SOLN
5.0000 mg | Freq: Four times a day (QID) | INTRAMUSCULAR | Status: DC | PRN
  Administered 2014-05-20: 5 mg via INTRAVENOUS
  Filled 2014-05-19: qty 2

## 2014-05-19 MED ORDER — CHLORHEXIDINE GLUCONATE 0.12 % MT SOLN
15.0000 mL | Freq: Two times a day (BID) | OROMUCOSAL | Status: DC
  Administered 2014-05-19 – 2014-05-20 (×3): 15 mL via OROMUCOSAL
  Filled 2014-05-19 (×2): qty 15

## 2014-05-19 NOTE — Progress Notes (Signed)
Chaplain received referral from Dr. Hilma Favors to assist with healthcare power of attorney and living will. Chaplain provided documents and reviewed with patient and his wife. They expressed understanding and will request chaplain services when they are ready to notarize. Patient and family have a faith community, [employer], and have received support from "several pastors." They are aware of spiritual support available from chaplains. Will follow up.   Ethelene Browns [phone]

## 2014-05-19 NOTE — Progress Notes (Signed)
NUTRITION FOLLOW UP  Intervention:    Continue Regular diet as tolerated.  No nutrition interventions needed at this time.   Please consult RD if nutrition concerns arise.  Nutrition Dx:   Malnutrition related to chronic disease as evidenced by intake </= 75% of estimated energy requirement for >/= one month with 7% weight loss in the past month, ongoing.  Goal:   Provide nutrition as tolerated.  Monitor:   Overall goals of care.  Assessment:   53 y.o. Male with h/o multiple myeloma (Stage IIIB) who presented to [facility] ED on 6/30 with a productive cough, altered mental status and slurred speech without focal deficits. Considered to have ARF, secondary to suspected Multiple Myeloma kidney injury and pre-renal hypovolemic ARF, UTI, and acute high AG metabolic acidosis with respiratory alkalosis. [facility] to [facility] on 6/30.   Palliative Care Team following patient. Plans for eventual transfer to [facility] with Hospice care. No plans for HD or plasmapheresis. No nutrition intervention needed at this time.  Height: Ht Readings from Last 1 Encounters:  05/18/14 '5\' 4"'  (1.626 m)    Weight Status:   Wt Readings from Last 1 Encounters:  05/19/14 213 lb 13.5 oz (97 kg)    Re-estimated needs:  Kcal: 1800-2000  Protein: 95-115 gm  Fluid: 1.8-2 L  Skin: intact  Diet Order: General   Intake/Output Summary (Last 24 hours) at 05/19/14 1058 Last data filed at 05/19/14 0800  Gross per 24 hour  Intake   2423 ml  Output    250 ml  Net   2173 ml    Last BM: 7/2   Labs:   Recent Labs Lab 05/18/14 0017 05/18/14 0827 05/19/14 0330  NA 143 147 149*  K 4.1 3.4* 3.5*  CL 94* 95* 97  CO2 12* 26 29  BUN 39* 41* 44*  CREATININE 4.57* 4.94* 5.79*  CALCIUM 7.3* 6.9* 6.8*  MG  --  1.6  --   PHOS  --  5.2* 5.2*  GLUCOSE 138* 162* 109*    CBG (last 3)   Recent Labs  05/18/14 2343 05/19/14 0356 05/19/14 0847  GLUCAP 121* 118* 111*    Scheduled Meds: .  antiseptic oral rinse  15 mL Mouth Rinse BID  . chlorhexidine  15 mL Mouth Rinse BID  . Chlorhexidine Gluconate Cloth  6 each Topical Q0600  . mupirocin ointment  1 application Nasal BID  . pantoprazole (PROTONIX) IV  40 mg Intravenous Q12H    Continuous Infusions: . sodium chloride 10 mL/hr (05/18/14 1657)    Molli Barrows, RD, LDN, [place] Pager [phone] After Hours Pager [phone]

## 2014-05-19 NOTE — Progress Notes (Addendum)
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  Renal function nl 04/2014 with Cr 0.7-->2.9 on 05/16/14--> Renal US unremarkable.  Kappa free light chains1250.00.    VSS, remains oliguric.  Pt without pain this AM.  Less confused this AM.  No SOB, N/V.  Wife at bedside.    O:BP 117/70  Pulse 83  Temp(Src) 98.2 F (36.8 C) (Oral)  Resp 17  Ht '5\' 4"'  (1.626 m)  Wt 213 lb 13.5 oz (97 kg)  BMI 36.69 kg/m2  SpO2 97%  Intake/Output Summary (Last 24 hours) at 05/19/14 0737 Last data filed at 05/19/14 0700  Gross per 24 hour  Intake   2888 ml  Output    250 ml  Net   2638 ml   Intake/Output: I/O last 3 completed shifts: In: 4538 [P.O.:200; I.V.:2175; Blood:1763; IV Piggyback:400] Out: 250 [Urine:250]    Weight change: 5 lb 4.7 oz (2.4 kg) Gen:NAD, alert but disoriented  CVS:RRR Resp:CTAB OFB:PZWCH, soft, NT/ND, +BS ENI:DPOE, dry, no LE edema   Recent Labs Lab 05/16/14 0814 05/17/14 1258 05/17/14 1312 05/18/14 0017 05/18/14 0827 05/19/14 0330  NA 142 143 140 143 147 149*  K 3.5 4.6 4.3 4.1 3.4* 3.5*  CL  --  94* 106 94* 95* 97  CO2 18* 8*  --  12* 26 29  GLUCOSE 72 107* 96 138* 162* 109*  BUN 15.2 35* 30* 39* 41* 44*  CREATININE 2.9* 4.95* 5.50* 4.57* 4.94* 5.79*  ALBUMIN 2.8* 2.9*  --   --   --  2.9*  CALCIUM 8.7 7.9*  --  7.3* 6.9* 6.8*  PHOS  --   --   --   --  5.2* 5.2*  AST 38* 37  --   --   --   --   ALT 11 10  --   --   --   --    Liver Function Tests:  Recent Labs Lab 05/16/14 0814 05/17/14 1258 05/19/14 0330  AST 38* 37  --   ALT 11 10  --   ALKPHOS 149 154*  --   BILITOT 0.96 0.5  --   PROT 5.9* 6.3  --   ALBUMIN 2.8* 2.9* 2.9*   No results found for this basename: LIPASE, AMYLASE,  in the last 168 hours No results found for this basename: AMMONIA,  in the last 168 hours CBC:  Recent Labs Lab 05/16/14 0813  05/17/14 1258  05/17/14 2130  05/18/14 0827 05/19/14 0330  WBC 6.3  < > 6.9  --  7.1 5.5 4.8  NEUTROABS 3.7  --  5.4  --   --   --   --   HGB 9.9*  --  9.1*  < > 8.0* 6.9* 7.6*  HCT 32.7*  --  29.6*  < > 25.9* 22.4* 23.8*  MCV 91.6  --  91.4  --  90.6 90.0 89.5  PLT 184  < > 247  --  234 189 135*  < > = values in this interval not displayed. Cardiac Enzymes: No results found for this basename: CKTOTAL, CKMB, CKMBINDEX, TROPONINI,  in the last 168 hours CBG:  Recent Labs Lab 05/18/14 1153 05/18/14 1525 05/18/14 1906 05/18/14 2343 05/19/14 0356  GLUCAP 147* 128* 134* 121* 118*    Iron Studies: No results found for this basename: IRON, TIBC, TRANSFERRIN, FERRITIN,  in the last 72 hours Studies/Results: Dg Chest 2  View  05/17/2014   CLINICAL DATA:  ALTERED MENTAL STATUS ALTERED MENTAL STATUS  EXAM: CHEST  2 VIEW  COMPARISON:  Prior radiograph from 07/05/2013  FINDINGS: Right-sided Port-A-Cath in place with tip overlying the mid SVC. Cardiomegaly is grossly stable. Mediastinal silhouette within normal limits.  The lungs are normally inflated. No airspace consolidation or pulmonary edema is identified. Irregular pleural thickening along the lateral right lung related to history of multiple myeloma is grossly similar. There is no pneumothorax.  Changes of multiple myeloma with fracture and irregularity of the right posterior sixth rib is grossly stable from prior CT. No definite acute osseous abnormality.  IMPRESSION: 1. No active cardiopulmonary abnormality identified. 2. Similar changes related to multiple myeloma.   Electronically Signed   By: Jeannine Boga M.D.   On: 05/17/2014 14:00   Ct Head Wo Contrast  05/17/2014   CLINICAL DATA:  Altered mental status.  EXAM: CT HEAD WITHOUT CONTRAST  TECHNIQUE: Contiguous axial images were obtained from the base of the skull through the vertex without intravenous contrast.  COMPARISON:  Head CT scan 09/03/2012.  FINDINGS: Cortical atrophy is again seen. No evidence of  acute abnormality including hemorrhage, infarct, mass lesion, mass effect, midline shift or abnormal extra-axial fluid collection is identified. There is no hydrocephalus or pneumocephalus. The calvarium is intact. Tiny lucencies in the calvarium consistent with history of multiple myeloma are noted. Tiny mucous retention cyst or polyp left sphenoid sinus is seen. Sinus disease seen on the prior examination is markedly improved.  IMPRESSION: No acute abnormality.  Improved sinus disease.   Electronically Signed   By: Inge Rise M.D.   On: 05/17/2014 12:37   US Renal  05/17/2014   CLINICAL DATA:  Renal insufficiency. Multiple myeloma with chemotherapy ongoing.  EXAM: RENAL/URINARY TRACT ULTRASOUND COMPLETE  COMPARISON:  Renal ultrasound 02/03/2013.  FINDINGS: Examination is mildly limited by body habitus, bowel gas and patient condition.  Right Kidney:  Length: 13.0 cm. Echogenicity within normal limits. No mass or hydronephrosis visualized.  Left Kidney:  Length: 12.8 cm. Echogenicity within normal limits. No mass or hydronephrosis visualized.  Bladder:  Nearly empty. Ureteral jets could not be adequately evaluated due to patient condition.  IMPRESSION: Both kidneys are normal in size without hydronephrosis. No definite acute abnormalities identified with limitations as noted above.   Electronically Signed   By: Camie Patience M.D.   On: 05/17/2014 17:44   Dg Chest Port 1 View  05/18/2014   CLINICAL DATA:  Multiple myeloma and hypertension  EXAM: PORTABLE CHEST - 1 VIEW  COMPARISON:  May 17, 2014  FINDINGS: Port-A-Cath tip is in the superior vena cava, stable. No pneumothorax. There is no edema or consolidation. There is stable asymmetric pleural thickening in the right apex region. Heart is mildly enlarged with pulmonary vascularity within normal limits. There is no appreciable adenopathy. There is a stable fracture of the right sixth rib with lytic change in the right sixth and seventh ribs consistent  with known multiple myeloma.  IMPRESSION: No edema or consolidation. No change in cardiac silhouette. Bony changes indicative of multiple myeloma on the right, stable. Stable asymmetric right apical and apicolateral pleural thickening.   Electronically Signed   By: Lowella Grip M.D.   On: 05/18/2014 07:24   . antiseptic oral rinse  15 mL Mouth Rinse BID  . Chlorhexidine Gluconate Cloth  6 each Topical Q0600  . famotidine (PEPCID) IV  20 mg Intravenous Q12H  . insulin aspart  0-24 Units  Subcutaneous 6 times per day  . lidocaine-prilocaine   Topical Once  . mupirocin ointment  1 application Nasal BID  . phytonadione  10 mg Subcutaneous BID  . piperacillin-tazobactam (ZOSYN)  IV  2.25 g Intravenous 3 times per day    BMET    Component Value Date/Time   NA 149* 05/19/2014 0330   NA 142 05/16/2014 0814   K 3.5* 05/19/2014 0330   K 3.5 05/16/2014 0814   CL 97 05/19/2014 0330   CL 112* 05/10/2013 0849   CO2 29 05/19/2014 0330   CO2 18* 05/16/2014 0814   GLUCOSE 109* 05/19/2014 0330   GLUCOSE 72 05/16/2014 0814   GLUCOSE 94 05/10/2013 0849   BUN 44* 05/19/2014 0330   BUN 15.2 05/16/2014 0814   CREATININE 5.79* 05/19/2014 0330   CREATININE 2.9* 05/16/2014 0814   CALCIUM 6.8* 05/19/2014 0330   CALCIUM 8.7 05/16/2014 0814   GFRNONAA 10* 05/19/2014 0330   GFRAA 12* 05/19/2014 0330   CBC    Component Value Date/Time   WBC 4.8 05/19/2014 0330   WBC 6.3 05/16/2014 0813   RBC 2.66* 05/19/2014 0330   RBC 3.57* 05/16/2014 0813   HGB 7.6* 05/19/2014 0330   HGB 9.9* 05/16/2014 0813   HCT 23.8* 05/19/2014 0330   HCT 32.7* 05/16/2014 0813   PLT 135* 05/19/2014 0330   PLT 184 05/16/2014 0813   MCV 89.5 05/19/2014 0330   MCV 91.6 05/16/2014 0813   MCH 28.6 05/19/2014 0330   MCH 27.7 05/16/2014 0813   MCHC 31.9 05/19/2014 0330   MCHC 30.3* 05/16/2014 0813   RDW 20.6* 05/19/2014 0330   RDW 19.6* 05/16/2014 0813   LYMPHSABS 0.8 05/17/2014 1258   LYMPHSABS 1.4 05/16/2014 0813   MONOABS 0.6 05/17/2014 1258   MONOABS 1.0* 05/16/2014 0813    EOSABS 0.0 05/17/2014 1258   EOSABS 0.1 05/16/2014 0813   BASOSABS 0.1 05/17/2014 1258   BASOSABS 0.2* 05/16/2014 0813    Assessment/Plan:  1.   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   Acute encephalopathy-likely multifactorial in the setting of UTI, AKI, and meds (in the setting of AKI-Norco, MS Contin, dexamethasone, gabapentin home meds)  3.   Anemia-hgb 7.6 today, s/p 1 prbcs yesterday; INR trending down; receiving 1 unit of FFP and 1 unit of prbcs due to oliguria; monitor  4.   Progress multiple myeloma-oncology following, appreciate recs  5.   UTI-continue zosyn  6.   Electrolyte abnormalities-mild hypernatremia, would add free water; mild hypokalemia, replete   Gordy Levan, Jacquelyn. MD PGY-2, IMTS   I have seen and examined this patient and agree with plan as outlined by Dr. Gordy Levan.  Appreciate the input of Palliative Care as well as Heme and PCCM.  Pt has had progressive multiple myeloma with multiple chemotherapy regimens and now with renal failure and has opted for CMO.  Agree with plan and will sign off. Arad Burston A,MD 05/19/2014 8:43 PM

## 2014-05-19 NOTE — Progress Notes (Signed)
Subjective: [name] is seen and examined this morning. No family member at the bedside. He is still complaining of fatigue and weakness. He is still unable to make a decision regarding hemodialysis or plasmapheresis or future treatment for his condition. He is waiting for his wife to help him with his decision.   Objective: Vital signs in last 24 hours: Temp:  [98.1 F (36.7 C)-99.2 F (37.3 C)] 98.2 F (36.8 C) (07/02 0358) Pulse Rate:  [79-93] 83 (07/02 0700) Resp:  [12-23] 17 (07/02 0700) BP: (108-126)/(58-70) 117/70 mmHg (07/02 0700) SpO2:  [95 %-98 %] 97 % (07/02 0700) Weight:  [213 lb 13.5 oz (97 kg)] 213 lb 13.5 oz (97 kg) (07/02 0500)  Intake/Output from previous day: 07/01 0701 - 07/02 0700 In: 2888 [P.O.:200; I.V.:675; Blood:1763; IV Piggyback:250] Out: 250 [Urine:250] Intake/Output this shift:    General appearance: alert, cooperative, fatigued and no distress Resp: clear to auscultation bilaterally Cardio: regular rate and rhythm, S1, S2 normal, no murmur, click, rub or gallop GI: soft, non-tender; bowel sounds normal; no masses,  no organomegaly Extremities: extremities normal, atraumatic, no cyanosis or edema  Lab Results:   Recent Labs  05/18/14 0827 05/19/14 0330  WBC 5.5 4.8  HGB 6.9* 7.6*  HCT 22.4* 23.8*  PLT 189 135*   BMET  Recent Labs  05/18/14 0827 05/19/14 0330  NA 147 149*  K 3.4* 3.5*  CL 95* 97  CO2 26 29  GLUCOSE 162* 109*  BUN 41* 44*  CREATININE 4.94* 5.79*  CALCIUM 6.9* 6.8*    Studies/Results: Dg Chest 2 View  05/17/2014   CLINICAL DATA:  ALTERED MENTAL STATUS ALTERED MENTAL STATUS  EXAM: CHEST  2 VIEW  COMPARISON:  Prior radiograph from 07/05/2013  FINDINGS: Right-sided Port-A-Cath in place with tip overlying the mid SVC. Cardiomegaly is grossly stable. Mediastinal silhouette within normal limits.  The lungs are normally inflated. No airspace consolidation or pulmonary edema is identified. Irregular pleural thickening  along the lateral right lung related to history of multiple myeloma is grossly similar. There is no pneumothorax.  Changes of multiple myeloma with fracture and irregularity of the right posterior sixth rib is grossly stable from prior CT. No definite acute osseous abnormality.  IMPRESSION: 1. No active cardiopulmonary abnormality identified. 2. Similar changes related to multiple myeloma.   Electronically Signed   By: Jeannine Boga M.D.   On: 05/17/2014 14:00   Ct Head Wo Contrast  05/17/2014   CLINICAL DATA:  Altered mental status.  EXAM: CT HEAD WITHOUT CONTRAST  TECHNIQUE: Contiguous axial images were obtained from the base of the skull through the vertex without intravenous contrast.  COMPARISON:  Head CT scan 09/03/2012.  FINDINGS: Cortical atrophy is again seen. No evidence of acute abnormality including hemorrhage, infarct, mass lesion, mass effect, midline shift or abnormal extra-axial fluid collection is identified. There is no hydrocephalus or pneumocephalus. The calvarium is intact. Tiny lucencies in the calvarium consistent with history of multiple myeloma are noted. Tiny mucous retention cyst or polyp left sphenoid sinus is seen. Sinus disease seen on the prior examination is markedly improved.  IMPRESSION: No acute abnormality.  Improved sinus disease.   Electronically Signed   By: Inge Rise M.D.   On: 05/17/2014 12:37   US Renal  05/17/2014   CLINICAL DATA:  Renal insufficiency. Multiple myeloma with chemotherapy ongoing.  EXAM: RENAL/URINARY TRACT ULTRASOUND COMPLETE  COMPARISON:  Renal ultrasound 02/03/2013.  FINDINGS: Examination is mildly limited by body habitus, bowel gas and patient  condition.  Right Kidney:  Length: 13.0 cm. Echogenicity within normal limits. No mass or hydronephrosis visualized.  Left Kidney:  Length: 12.8 cm. Echogenicity within normal limits. No mass or hydronephrosis visualized.  Bladder:  Nearly empty. Ureteral jets could not be adequately evaluated  due to patient condition.  IMPRESSION: Both kidneys are normal in size without hydronephrosis. No definite acute abnormalities identified with limitations as noted above.   Electronically Signed   By: Camie Patience M.D.   On: 05/17/2014 17:44   Dg Chest Port 1 View  05/18/2014   CLINICAL DATA:  Multiple myeloma and hypertension  EXAM: PORTABLE CHEST - 1 VIEW  COMPARISON:  May 17, 2014  FINDINGS: Port-A-Cath tip is in the superior vena cava, stable. No pneumothorax. There is no edema or consolidation. There is stable asymmetric pleural thickening in the right apex region. Heart is mildly enlarged with pulmonary vascularity within normal limits. There is no appreciable adenopathy. There is a stable fracture of the right sixth rib with lytic change in the right sixth and seventh ribs consistent with known multiple myeloma.  IMPRESSION: No edema or consolidation. No change in cardiac silhouette. Bony changes indicative of multiple myeloma on the right, stable. Stable asymmetric right apical and apicolateral pleural thickening.   Electronically Signed   By: Lowella Grip M.D.   On: 05/18/2014 07:24    Medications: I have reviewed the patient's current medications.  CODE STATUS: No CODE BLUE  Assessment/Plan: 1) Progressive Multiple Myeloma: He was diagnosed in 2008 and underwent several Chemotherapy regimens including peripheral blood SCT. Most recently treated with Carfilzomib, Pomalyst and Decadron but unfortunately his recent Myeloma panel showed evidence for significant disease progression.  The patient has very poor prognosis but we were able to keep him alive with newer chemotherapy regimens.  Currently there is no standard treatment options but there is a clinical trial with a newer agent Elotuzumab that may be an option for him if he gets a little bit better.  I spoke to his wife yesterday and no decision has been made regarding proceeding with treatment, hemodialysis, plasmapheresis or  palliative care.   2) Renal failure: most likely secondary to progression of his Myeloma. His serum creatinine is worse today. He may benefit from short course of Plasmaphoresis if they are interested in considering further chemotherapy, otherwise palliative care and hospice should be considered.    LOS: 2 days    Ayme Short K. 05/19/2014

## 2014-05-19 NOTE — Consult Note (Signed)
'@[email or web address]' @ Palliative Medicine Team at [facility]  Date: 05/19/2014   Patient Name: [name]  DOB: [birth year]  MRN: [id number]  Age / Sex: 53 y.o., male   PCP: Redmond School, MD Referring Physician: Brand Males, MD  Active Problems: Active Problems:   Multiple myeloma   Chronic pain   Acute renal failure   Acute encephalopathy   Acidosis   HPI/Reason for Consultation: [name] is a 53 y.o. male diagnosied with Multiple Myeloma in 2008 and has had multiple rounds and regimens of chemotherapy and radiation- virtually exhausting his options for control of his disease. He has had a particularly bad last few months with intractable pain, decline in functional status and disease progression despite chemotherapy. He was admitted to the ICU with altered mental status and UTI and found to have severe metabolic derangements. 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 PMT consulted for goals of care and to discuss next steps in terms of seeking more aggressive treatment vs. Comfort care approach.  Participants in Discussion: Patient (mental status improved), daughter and his wife  Goals/Summary of Case:   Advance Directive: None formally on file- patient requesting HCPOA   Code Status Orders        Start     Ordered   05/19/14 0950  Do not attempt resuscitation (DNR)   Continuous     05/19/14 0953      I have reviewed the medical record, interviewed the patient and family, and examined the patient. The following aspects are pertinent.  Past Medical History  Diagnosis Date  . Multiple myeloma 09/24/2011  . Hypertension   . History of radiation therapy 07/09/2007-07/30/07    3000 cGy to left pelvis and proximal femur   History   Social History  . Marital Status: Married    Spouse Name: N/A    Number of Children: 1  . Years of Education: N/A   Occupational History  . SWITCHER Engineer, mining   Social History Main Topics  . Smoking status:  Never Smoker   . Smokeless tobacco: Never Used  . Alcohol Use: No  . Drug Use: No  . Sexual Activity: No   Other Topics Concern  . None   Social History Narrative  . None   Family History  Problem Relation Age of Onset  . Diabetic kidney disease Mother   . Hypertension Mother   . Hypertension Father   . Kidney failure Father     Scheduled Meds: . antiseptic oral rinse  15 mL Mouth Rinse BID  . chlorhexidine  15 mL Mouth Rinse BID  . Chlorhexidine Gluconate Cloth  6 each Topical Q0600  . famotidine (PEPCID) IV  20 mg Intravenous Q12H  . mupirocin ointment  1 application Nasal BID  . piperacillin-tazobactam (ZOSYN)  IV  2.25 g Intravenous 3 times per day   Continuous Infusions: . sodium chloride 10 mL/hr (05/18/14 1657)   PRN Meds:.sodium chloride, diazepam, fentaNYL, ondansetron (ZOFRAN) IV Allergies  Allergen Reactions  . Red Dye Anaphylaxis    Lips swollen  3-4 times their baseline size  . Other Other (See Comments)    Strawberries "anything containing red dye"   CBC:    Component Value Date/Time   WBC 4.8 05/19/2014 0330   WBC 6.3 05/16/2014 0813   HGB 7.6* 05/19/2014 0330   HGB 9.9* 05/16/2014 0813   HCT 23.8* 05/19/2014 0330   HCT 32.7* 05/16/2014 0813   PLT 135* 05/19/2014 0330   PLT 184 05/16/2014 0813  MCV 89.5 05/19/2014 0330   MCV 91.6 05/16/2014 0813   NEUTROABS 5.4 05/17/2014 1258   NEUTROABS 3.7 05/16/2014 0813   LYMPHSABS 0.8 05/17/2014 1258   LYMPHSABS 1.4 05/16/2014 0813   MONOABS 0.6 05/17/2014 1258   MONOABS 1.0* 05/16/2014 0813   EOSABS 0.0 05/17/2014 1258   EOSABS 0.1 05/16/2014 0813   BASOSABS 0.1 05/17/2014 1258   BASOSABS 0.2* 05/16/2014 0813    Comprehensive Metabolic Panel:    Component Value Date/Time   NA 149* 05/19/2014 0330   NA 142 05/16/2014 0814   K 3.5* 05/19/2014 0330   K 3.5 05/16/2014 0814   CL 97 05/19/2014 0330   CL 112* 05/10/2013 0849   CO2 29 05/19/2014 0330   CO2 18* 05/16/2014 0814   BUN 44* 05/19/2014 0330   BUN 15.2 05/16/2014 0814    CREATININE 5.79* 05/19/2014 0330   CREATININE 2.9* 05/16/2014 0814   GLUCOSE 109* 05/19/2014 0330   GLUCOSE 72 05/16/2014 0814   GLUCOSE 94 05/10/2013 0849   CALCIUM 6.8* 05/19/2014 0330   CALCIUM 8.7 05/16/2014 0814   AST 37 05/17/2014 1258   AST 38* 05/16/2014 0814   ALT 10 05/17/2014 1258   ALT 11 05/16/2014 0814   ALKPHOS 154* 05/17/2014 1258   ALKPHOS 149 05/16/2014 0814   BILITOT 0.5 05/17/2014 1258   BILITOT 0.96 05/16/2014 0814   PROT 6.3 05/17/2014 1258   PROT 5.9* 05/16/2014 0814   ALBUMIN 2.9* 05/19/2014 0330   ALBUMIN 2.8* 05/16/2014 0814   Vital Signs: BP 133/68  Pulse 84  Temp(Src) 98.3 F (36.8 C) (Oral)  Resp 19  Ht '5\' 4"'  (1.626 m)  Wt 97 kg (213 lb 13.5 oz)  BMI 36.69 kg/m2  SpO2 98% Filed Weights   05/17/14 1845 05/18/14 0500 05/19/14 0500  Weight: 94.6 kg (208 lb 8.9 oz) 96.1 kg (211 lb 13.8 oz) 97 kg (213 lb 13.5 oz)   Physical Exam:  General appearance: NAD, conversant  Eyes: anicteric sclerae, moist conjunctivae; no lid-lag; PERRLA HENT: Atraumatic; oropharynx clear with moist mucous membranes and no mucosal ulcerations; normal hard and soft palate Neck: Trachea midline; FROM, supple, no thyromegaly or lymphadenopathy Lungs: Scattered rhonchi CV: RRR, no MRGs  Abdomen: Soft, non-tender; no masses or HSM Extremities: +peripheral edmea, LE weakness Skin: Normal temperature, turgor and texture; no rash, ulcers or subcutaneous nodules Psych: Appropriate affect, alert and oriented to person, place and time  Assessment/Prognosis: Primary Diagnoses  End Stage Multiple Myeloma with disease progression and Renal Failure  Active Symptoms:  Pain, has been severe prior to admission however currently he says his pain has been the best it has been in over a year-I suspect this is related to systemic acidosis and renal failure. I talk to him specifically about signs of opiate withdrawal and also not letting his pain escalate. Will leave PRN for now.  Prognosis: Given his severe  metabolic derangements I anticipate <4 weeks, possibly much less including a rapid decline.  PPS: 40%   Scope of Treatment / Recommendations:   Focus on Comfort and QOL  Stop Anticoagulation  Complete course of Antibiotics (UTI-switch to oral)  Consider starting a low dose of decadron to minimize AI  No Hemodialysis or Plasmapheresis  Agrees to Hospice Care  Disposition: This patient is appropriate fopr [place] Referral- has complex EOL management needs including pain from bone malignancy, impending renal failure.  Family request [facility]- will transfer out of ICU and move to 6N.   Time: 90 minutes Greater  than 50%  of this time was spent counseling and coordinating care related to the above assessment and plan.  Signed by: Roma Schanz, DO  05/19/2014, 10:08 AM  Please contact Palliative Medicine Team phone at [phone] for questions and concerns.

## 2014-05-19 NOTE — Progress Notes (Signed)
PULMONARY / CRITICAL CARE MEDICINE   Name: [name] MRN: [id number] DOB: 1961-01-04    ADMISSION DATE:  05/17/2014 CONSULTATION DATE:  05/17/14  REFERRING MD :  [facility] ED PRIMARY SERVICE: [facility] ED -> PCCM  CHIEF COMPLAINT: Confusion, UTI, metabolic acidosis  BRIEF PATIENT DESCRIPTION: 53 y.o. Male with h/o multiple myeloma (Stage IIIB) - on empiric anticoagulation, hx of chest tube for empyema, htn, and arteritis who presented to [facility] ED on 6/30 with a productive cough, altered mental status and slurred speech without focal deficits which began at 0500. Considered to have ARF, secondary to suspected Multiple Myeloma kidney injury and pre-renal hypovolemic ARF, UTI, and acute high AG metabolic acidosis with respiratory alkalosis. Transferred from [facility] to [place].  SIGNIFICANT EVENTS / STUDIES:  6/30: Arrived to [facility]ED and transferred to [facility] for acute renal failure 6/30: Head CT: no acute, improved sinus disease, Renal US (nml) 6/30: Foley unsuccessful with bleeding, bladder scan (minimal volume), LA 3.7 7/1: Hgb 8.0 >> 6.9, improved met acidosis, Onc suspect MM, recommend plasmaphoresis 7.2- decided against plasma xchange  LINES / TUBES: PIVs (6/30 Foley - unsuccessful)  CULTURES: Blood cultures 6/30 >> NGTD Sputum Culture >>> not collected Urine culture 6/30 >>> MRSA by PCR 6/30 >>> Pos  ANTIBIOTICS: Ceftriaxone 6/30 x1 Zosyn 6/30 >>  SUBJECTIVE: Per RN, patient's mental status is improved this morning.  He is oriented x 3 with some confusion as to what brought him into the hospital.  Asking for a drink.  Palliative care currently at bedside.  VITAL SIGNS: Temp:  [98.1 F (36.7 C)-99.2 F (37.3 C)] 98.3 F (36.8 C) (07/02 0800) Pulse Rate:  [79-93] 84 (07/02 0800) Resp:  [12-23] 19 (07/02 0800) BP: (114-133)/(58-70) 133/68 mmHg (07/02 0800) SpO2:  [95 %-98 %] 98 % (07/02 0800) Weight:  [97 kg (213 lb 13.5 oz)] 97 kg (213 lb 13.5 oz) (07/02 0500) RA   INTAKE /  OUTPUT: Intake/Output     07/01 0701 - 07/02 0700 07/02 0701 - 07/03 0700   P.O. 200    I.V. (mL/kg) 675 (7) 10 (0.1)   Blood 1763    IV Piggyback 250    Total Intake(mL/kg) 2888 (29.8) 10 (0.1)   Urine (mL/kg/hr) 250 (0.1)    Total Output 250     Net +2638 +10        Urine Occurrence 1 x    Stool Occurrence 1 x     PHYSICAL EXAMINATION: General: Chronically ill-appearing AA male, laying in bed, NAD. Neuro: Awake, alert and oriented x 3. Able to follow commands. Answering questions appropriately. Speech very slightly slurred. HEENT: [place]/AT, PERRL, oral mucosa pink and slightly dry. No bleeding from gums or nares noted.  Cardiovascular: S1 S2, RRR, no m/g/r. Peripheral pulses present and equal U/L extremities bil. Lungs: Diminished breath sounds in bases bilaterally.  Occasional rhonchi. Abdomen: Soft, non-tender, non-distended.  BS + Musculoskeletal:  No acute deformities noted.  Pedal pulses present and equal. Skin:  Warm, dry, intact.  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  LABS:  PULMONARY  Recent Labs Lab 05/17/14 1237 05/17/14 1312 05/17/14 1621 05/18/14 0410  PHART 7.319*  --  7.339* 7.508*  PCO2ART 15.5*  --  18.9* 26.4*  PO2ART 107.0*  --  55.1* 80.2  HCO3 7.7*  --  9.9* 21.0  TCO2 7.4 9 9.3 21.8  O2SAT 96.9  --  82.7 97.3   CBC  Recent Labs Lab 05/17/14 2130 05/18/14 0827  05/19/14 0330  HGB 8.0* 6.9* 7.6*  HCT 25.9* 22.4* 23.8*  WBC 7.1 5.5 4.8  PLT 234 189 135*   COAGULATION  Recent Labs Lab 05/16/14 0814 05/17/14 1258 05/17/14 2130 05/18/14 0827 05/19/14 0330  INR >10.00* 6.62* 7.79* 5.47* 1.71*   CARDIAC  No results found for this basename: TROPONINI,  in the last 168 hours  Recent Labs Lab 05/17/14 1342  PROBNP 8040.0*   CHEMISTRY  Recent Labs Lab 05/16/14 0814  05/17/14 1258 05/17/14 1312 05/18/14 0017 05/18/14 0827 05/19/14 0330  NA 142  --  143 140 143 147 149*  K 3.5  < > 4.6 4.3  4.1 3.4* 3.5*  CL  --   --  94* 106 94* 95* 97  CO2 18*  --  8*  --  12* 26 29  GLUCOSE 72  --  107* 96 138* 162* 109*  BUN 15.2  --  35* 30* 39* 41* 44*  CREATININE 2.9*  --  4.95* 5.50* 4.57* 4.94* 5.79*  CALCIUM 8.7  --  7.9*  --  7.3* 6.9* 6.8*  MG  --   --   --   --   --  1.6  --   PHOS  --   --   --   --   --  5.2* 5.2*  < > = values in this interval not displayed. Estimated Creatinine Clearance: 15.9 ml/min (by C-G formula based on Cr of 5.79).  LIVER  Recent Labs Lab 05/16/14 0814 05/17/14 1258 05/17/14 2130 05/18/14 0827 05/19/14 0330  AST 38* 37  --   --   --   ALT 11 10  --   --   --   ALKPHOS 149 154*  --   --   --   BILITOT 0.96 0.5  --   --   --   PROT 5.9* 6.3  --   --   --   ALBUMIN 2.8* 2.9*  --   --  2.9*  INR >10.00* 6.62* 7.79* 5.47* 1.71*   INFECTIOUS  Recent Labs Lab 05/17/14 1348 05/18/14 0930 05/19/14 0330  LATICACIDVEN 3.23* 3.1* 2.3*   ENDOCRINE CBG (last 3)   Recent Labs  05/18/14 1906 05/18/14 2343 05/19/14 0356  GLUCAP 134* 121* 118*   IMAGING x48h  Dg Chest 2 View  05/17/2014   CLINICAL DATA:  ALTERED MENTAL STATUS ALTERED MENTAL STATUS  EXAM: CHEST  2 VIEW  COMPARISON:  Prior radiograph from 07/05/2013  FINDINGS: Right-sided Port-A-Cath in place with tip overlying the mid SVC. Cardiomegaly is grossly stable. Mediastinal silhouette within normal limits.  The lungs are normally inflated. No airspace consolidation or pulmonary edema is identified. Irregular pleural thickening along the lateral right lung related to history of multiple myeloma is grossly similar. There is no pneumothorax.  Changes of multiple myeloma with fracture and irregularity of the right posterior sixth rib is grossly stable from prior CT. No definite acute osseous abnormality.  IMPRESSION: 1. No active cardiopulmonary abnormality identified. 2. Similar changes related to multiple myeloma.   Electronically Signed   By: Jeannine Boga M.D.   On: 05/17/2014 14:00    Ct Head Wo Contrast  05/17/2014   CLINICAL DATA:  Altered mental status.  EXAM: CT HEAD WITHOUT CONTRAST  TECHNIQUE: Contiguous axial images were obtained from the base of the skull through the vertex without intravenous contrast.  COMPARISON:  Head CT scan 09/03/2012.  FINDINGS: Cortical atrophy is again seen. No evidence of acute abnormality including hemorrhage,  infarct, mass lesion, mass effect, midline shift or abnormal extra-axial fluid collection is identified. There is no hydrocephalus or pneumocephalus. The calvarium is intact. Tiny lucencies in the calvarium consistent with history of multiple myeloma are noted. Tiny mucous retention cyst or polyp left sphenoid sinus is seen. Sinus disease seen on the prior examination is markedly improved.  IMPRESSION: No acute abnormality.  Improved sinus disease.   Electronically Signed   By: Inge Rise M.D.   On: 05/17/2014 12:37   US Renal  05/17/2014   CLINICAL DATA:  Renal insufficiency. Multiple myeloma with chemotherapy ongoing.  EXAM: RENAL/URINARY TRACT ULTRASOUND COMPLETE  COMPARISON:  Renal ultrasound 02/03/2013.  FINDINGS: Examination is mildly limited by body habitus, bowel gas and patient condition.  Right Kidney:  Length: 13.0 cm. Echogenicity within normal limits. No mass or hydronephrosis visualized.  Left Kidney:  Length: 12.8 cm. Echogenicity within normal limits. No mass or hydronephrosis visualized.  Bladder:  Nearly empty. Ureteral jets could not be adequately evaluated due to patient condition.  IMPRESSION: Both kidneys are normal in size without hydronephrosis. No definite acute abnormalities identified with limitations as noted above.   Electronically Signed   By: Camie Patience M.D.   On: 05/17/2014 17:44   Dg Chest Port 1 View  05/18/2014   CLINICAL DATA:  Multiple myeloma and hypertension  EXAM: PORTABLE CHEST - 1 VIEW  COMPARISON:  May 17, 2014  FINDINGS: Port-A-Cath tip is in the superior vena cava, stable. No pneumothorax.  There is no edema or consolidation. There is stable asymmetric pleural thickening in the right apex region. Heart is mildly enlarged with pulmonary vascularity within normal limits. There is no appreciable adenopathy. There is a stable fracture of the right sixth rib with lytic change in the right sixth and seventh ribs consistent with known multiple myeloma.  IMPRESSION: No edema or consolidation. No change in cardiac silhouette. Bony changes indicative of multiple myeloma on the right, stable. Stable asymmetric right apical and apicolateral pleural thickening.   Electronically Signed   By: Lowella Grip M.D.   On: 05/18/2014 07:24   ASSESSMENT / PLAN:  PULMONARY A:  At risk for respiratory failure - AMS, generalized weakness Metabolic acidosis (Bicarb 8 >> 12) with respiratory alkalosis and AG of 37 - Improved CXR 6/30: mild atelectasis - productive cough, monitor for interval development of pna with hydration Concern edema P:   - Supplementary O2 PRN (currently 98% on RA) - Rpt CXR 7/2 - Bicarb D/C'ed - Hold Lasix, unable to void w/o foley placement- if aggressive then would have to have foley then lasix - Abx See ID - Pulmonary hygiene  CARDIOVASCULAR A:  H/o Hypertension - stable P:  - 1/2 NS KVO IVF - Hold home clonidine, Nebivolol  - Hold Lasix  RENAL A:  Oliguric, unsuccessful Foley (bladder scan w/o significant urine production) UA suggestive of UTI, pending urine culture Urine studies / FeNa 0.16% (suggestive pre-renal) Renal US (nml) h/o vascultitis AKI, 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 Suggested [place] cath team (4N) to attempt first, call back if unable - now pt considering comfort, may need foley for comfort ,  repeat bladder scan -pt has now decided against plasma and wants hospice  GASTROINTESTINAL A:  Diarrhea  Nausea H/o Jerrye Bushy P:    - Zofran IV - Pepcid IV dc in setting ARF, add ppi - Advance diet as mental status improved  HEMATOLOGIC/ONCOLOGY A:  Multiple Myeloma Stage IIIB -currently on treatment with Carfilzomib 56 mg/M2, Pomalyst 3 mg by mouth daily for 21 days every 3 weeks in addition to Decadron 40 mg on a weekly basis Coagulopathy - chronic coumadin in the presence of Multiple Myeloma  Elevated INR - 7.79, no active signs of bleeding, s/p 7m Vit K 6/29 Hgb 9.1 >> 13.3 >> 8.0 >> 6.9 (note acute bleed with attempted Foley insertion) P:  - Vit K given 7/1 - INR improving with Vit K, FFP admin (7/1 5.47, 7/2 1.71) - Received FFP, slow transfusion d/t volume status issue/unable to void - Palliative care consulted - for comfort approach likley  INFECTIOUS A:   UTI Productive cough - CXR stable, no infiltrate Elevated Lactic Acid  P:   - dc zosyn, no source ID, less aggressive care planned  ENDOCRINE A:     Steroid induced hyperglycemia (weekly doses of decadron for MM) P:   - SSI   NEUROLOGIC A:   Altered mental status in the presence of UTI and acute renal failure - negative head CT 6/30 Chronic Pain d/t Multiple Myeloma  P: - RASS goal: 0 - PRN IV fentanyl for pain (no morphine in renal failure due to increased risk for delirium) - Hold PO morphine, norco, neurontin appreciate pall care help   Update:  Per palliative care, patient/family are interested in comfort care and potentially hospice.  SLowella Dell Reese, PA-S  To triad, floor  05/19/2014 9:01 AM  I have fully examined this patient and agree with above findings.    And edited infull  DLavon Paganini FTitus Mould MD, FMillersvillePgr: 3[facility]

## 2014-05-20 DIAGNOSIS — N189 Chronic kidney disease, unspecified: Secondary | ICD-10-CM

## 2014-05-20 MED ORDER — ALBUTEROL SULFATE (2.5 MG/3ML) 0.083% IN NEBU: 2.5000 mg | INHALATION_SOLUTION | Freq: Four times a day (QID) | RESPIRATORY_TRACT | Status: AC | PRN

## 2014-05-20 MED ORDER — LORAZEPAM 2 MG/ML PO CONC: 1.0000 mg | Freq: Four times a day (QID) | ORAL | Status: AC | PRN

## 2014-05-20 MED ORDER — CHLORHEXIDINE GLUCONATE 0.12 % MT SOLN: 15.0000 mL | Freq: Two times a day (BID) | OROMUCOSAL | Status: AC

## 2014-05-20 MED ORDER — BIOTENE DRY MOUTH MT LIQD: 15.0000 mL | Freq: Two times a day (BID) | OROMUCOSAL | Status: AC

## 2014-05-20 MED ORDER — MORPHINE SULFATE ER 60 MG PO TBCR: 60.0000 mg | EXTENDED_RELEASE_TABLET | Freq: Two times a day (BID) | ORAL | Status: AC

## 2014-05-20 MED ORDER — DOCUSATE SODIUM 100 MG PO CAPS: 100.0000 mg | ORAL_CAPSULE | Freq: Two times a day (BID) | ORAL | Status: AC | PRN

## 2014-05-20 MED ORDER — SODIUM CHLORIDE 0.9 % IJ SOLN
10.0000 mL | INTRAMUSCULAR | Status: DC | PRN
  Administered 2014-05-20: 10 mL

## 2014-05-20 MED ORDER — MORPHINE SULFATE (CONCENTRATE) 10 MG /0.5 ML PO SOLN: 10.0000 mg | ORAL | Status: AC | PRN

## 2014-05-20 NOTE — Progress Notes (Signed)
Report called to RN at [facility]. Pt discharged by [facility].

## 2014-05-20 NOTE — Clinical Social Work Note (Signed)
Per MD patient ready for Dc to [facility]. RN, patient's family, and facility notified of DC. RN has number for report (on dc packet). Next available ambulance transport requested. CSW signing off at this time.   Liz Beach MSW, [place], [place], [phone]

## 2014-05-20 NOTE — Discharge Instructions (Signed)
Disposition - [facility].   Condition guarded goal of care is comfort only   Activity as tolerated   Diet regular soft diet with feeding assistance for comfort, full aspiration precautions.

## 2014-05-20 NOTE — Progress Notes (Signed)
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  Renal function nl 04/2014 with Cr 0.7-->2.9 on 05/16/14--> Renal US unremarkable.  Kappa free light chains1250.00.    VSS.  Comfort care only, palliative care following.  No pain, SOB, N/V.  Did have an episode of hematuria last PM with small clots.  Wife and family at bedside.    O:BP 121/65  Pulse 92  Temp(Src) 98.6 F (37 C) (Oral)  Resp 16  Ht '5\' 4"'  (1.626 m)  Wt 213 lb 13.5 oz (97 kg)  BMI 36.69 kg/m2  SpO2 93%  Intake/Output Summary (Last 24 hours) at 05/20/14 0641 Last data filed at 05/20/14 0604  Gross per 24 hour  Intake    675 ml  Output      0 ml  Net    675 ml   Intake/Output: I/O last 3 completed shifts: In: 3093 [P.O.:300; I.V.:730; Blood:1763; IV Piggyback:300] Out: 250 [Urine:250]  Total I/O In: 74 [P.O.:460] Out: -  Weight change:  Gen:NAD, alert but disoriented  CVS:RRR Resp:CTAB BJY:NWGNF, soft, NT/ND, +BS AOZ:HYQM, dry, no LE edema   Recent Labs Lab 05/16/14 0814 05/17/14 1258 05/17/14 1312 05/18/14 0017 05/18/14 0827 05/19/14 0330  NA 142 143 140 143 147 149*  K 3.5 4.6 4.3 4.1 3.4* 3.5*  CL  --  94* 106 94* 95* 97  CO2 18* 8*  --  12* 26 29  GLUCOSE 72 107* 96 138* 162* 109*  BUN 15.2 35* 30* 39* 41* 44*  CREATININE 2.9* 4.95* 5.50* 4.57* 4.94* 5.79*  ALBUMIN 2.8* 2.9*  --   --   --  2.9*  CALCIUM 8.7 7.9*  --  7.3* 6.9* 6.8*  PHOS  --   --   --   --  5.2* 5.2*  AST 38* 37  --   --   --   --   ALT 11 10  --   --   --   --    Liver Function Tests:  Recent Labs Lab 05/16/14 0814 05/17/14 1258 05/19/14 0330  AST 38* 37  --   ALT 11 10  --   ALKPHOS 149 154*  --   BILITOT 0.96 0.5  --   PROT 5.9* 6.3  --   ALBUMIN 2.8* 2.9* 2.9*   No results found for this basename: LIPASE, AMYLASE,  in the last 168 hours No results found for this basename: AMMONIA,  in the last 168  hours CBC:  Recent Labs Lab 05/16/14 0813  05/17/14 1258  05/17/14 2130 05/18/14 0827 05/19/14 0330  WBC 6.3  < > 6.9  --  7.1 5.5 4.8  NEUTROABS 3.7  --  5.4  --   --   --   --   HGB 9.9*  --  9.1*  < > 8.0* 6.9* 7.6*  HCT 32.7*  --  29.6*  < > 25.9* 22.4* 23.8*  MCV 91.6  --  91.4  --  90.6 90.0 89.5  PLT 184  < > 247  --  234 189 135*  < > = values in this interval not displayed. Cardiac Enzymes: No results found for this basename: CKTOTAL, CKMB, CKMBINDEX, TROPONINI,  in the last 168 hours CBG:  Recent Labs Lab 05/18/14 1906 05/18/14 2343 05/19/14 0356 05/19/14 0847 05/19/14 1154  GLUCAP 134* 121* 118* 111* 124*    Iron Studies: No results found for this basename:  IRON, TIBC, TRANSFERRIN, FERRITIN,  in the last 72 hours Studies/Results: No results found. [name] antiseptic oral rinse  15 mL Mouth Rinse BID  . chlorhexidine  15 mL Mouth Rinse BID  . Chlorhexidine Gluconate Cloth  6 each Topical Q0600  . mupirocin ointment  1 application Nasal BID  . pantoprazole (PROTONIX) IV  40 mg Intravenous Q12H    BMET    Component Value Date/Time   NA 149* 05/19/2014 0330   NA 142 05/16/2014 0814   K 3.5* 05/19/2014 0330   K 3.5 05/16/2014 0814   CL 97 05/19/2014 0330   CL 112* 05/10/2013 0849   CO2 29 05/19/2014 0330   CO2 18* 05/16/2014 0814   GLUCOSE 109* 05/19/2014 0330   GLUCOSE 72 05/16/2014 0814   GLUCOSE 94 05/10/2013 0849   BUN 44* 05/19/2014 0330   BUN 15.2 05/16/2014 0814   CREATININE 5.79* 05/19/2014 0330   CREATININE 2.9* 05/16/2014 0814   CALCIUM 6.8* 05/19/2014 0330   CALCIUM 8.7 05/16/2014 0814   GFRNONAA 10* 05/19/2014 0330   GFRAA 12* 05/19/2014 0330   CBC    Component Value Date/Time   WBC 4.8 05/19/2014 0330   WBC 6.3 05/16/2014 0813   RBC 2.66* 05/19/2014 0330   RBC 3.57* 05/16/2014 0813   HGB 7.6* 05/19/2014 0330   HGB 9.9* 05/16/2014 0813   HCT 23.8* 05/19/2014 0330   HCT 32.7* 05/16/2014 0813   PLT 135* 05/19/2014 0330   PLT 184 05/16/2014 0813   MCV 89.5 05/19/2014 0330    MCV 91.6 05/16/2014 0813   MCH 28.6 05/19/2014 0330   MCH 27.7 05/16/2014 0813   MCHC 31.9 05/19/2014 0330   MCHC 30.3* 05/16/2014 0813   RDW 20.6* 05/19/2014 0330   RDW 19.6* 05/16/2014 0813   LYMPHSABS 0.8 05/17/2014 1258   LYMPHSABS 1.4 05/16/2014 0813   MONOABS 0.6 05/17/2014 1258   MONOABS 1.0* 05/16/2014 0813   EOSABS 0.0 05/17/2014 1258   EOSABS 0.1 05/16/2014 0813   BASOSABS 0.1 05/17/2014 1258   BASOSABS 0.2* 05/16/2014 0813    Assessment/Plan:  1.   End stage multiple myeloma-pt and family have opted for comfort care only; doing well this AM without any pain, did have some hematuria with small clots  2.   Acute encephalopathy-likely multifactorial in the setting of UTI, AKI, and meds (in the setting of AKI-Norco, MS Contin, dexamethasone, gabapentin home meds), would keep comfortable per palliative care recs  3.   Anemia-comfort care  4.   UTI-denies any dysuria, d/c zosyn   Gordy Levan, Jacquelyn. MD PGY-2, IMTS   I agree with plan for discharge to [facility], nothing further to add. Cloie Wooden A,MD 05/20/2014 1:34 PM

## 2014-05-20 NOTE — Discharge Summary (Signed)
[name], is a 53 y.o. male  DOB January 03, 1961  MRN [id number].  Admission date:  05/17/2014  Admitting Physician  Brand Males, MD  Discharge Date:  05/20/2014   Primary MD  [name]., MD  Recommendations for primary care physician for things to follow:   Goal of care is comfort patient being discharged to residential hospice   Admission Diagnosis  Acidosis [276.2] Lactic acid acidosis [276.2] Chronic anticoagulation [V58.61] Acute cystitis without hematuria [595.0] Acute encephalopathy [348.30] Renal failure (ARF), acute on chronic [584.9, 585.9] Multiple myeloma [203.00]   Discharge Diagnosis  Acidosis [276.2] Lactic acid acidosis [276.2] Chronic anticoagulation [V58.61] Acute cystitis without hematuria [595.0] Acute encephalopathy [348.30] Renal failure (ARF), acute on chronic [584.9, 585.9] Multiple myeloma [203.00]    Principal Problem:   Comfort measures only status Active Problems:   Multiple myeloma   Chronic pain   Acute renal failure   Acute encephalopathy   Acidosis   DNR (do not resuscitate)      Past Medical History  Diagnosis Date  . Multiple myeloma 09/24/2011  . Hypertension   . History of radiation therapy 07/09/2007-07/30/07    3000 cGy to left pelvis and proximal femur    Past Surgical History  Procedure Laterality Date  . Video bronchoscopy  12/11/2012    Procedure: VIDEO BRONCHOSCOPY;  Surgeon: Ivin Poot, MD;  Location: [place];  Service: Thoracic;  Laterality: N/A;  . Video assisted thoracoscopy  12/11/2012    Procedure: VIDEO ASSISTED THORACOSCOPY;  Surgeon: Ivin Poot, MD;  Location: [place];  Service: Thoracic;  Laterality: Right;  . Decortication  12/11/2012    Procedure: DECORTICATION;  Surgeon: Ivin Poot, MD;  Location: [place];  Service: Thoracic;   Laterality: N/A;       History of present illness and  Hospital Course:     Kindly see H&P for history of present illness and admission details, please review complete Labs, Consult reports and Test reports for all details in brief  HPI  53 y.o. Male with h/o multiple myeloma (Stage IIIB) - on empiric anticoagulation, hx of chest tube for empyema, htn, and arteritis who presented to [facility] ED on 6/30 with a productive cough, altered mental status and slurred speech without focal deficits which began at 0500. Considered to have ARF, secondary to suspected Multiple Myeloma kidney injury and pre-renal hypovolemic ARF, UTI, and acute high AG metabolic acidosis with respiratory alkalosis.     Transferred from [facility] to [place]. Was seen by primary oncologist Dr. Julien Nordmann, renal team and PCCM, after detailed discussions between the involved physicians patient and wife (POA) it was decided that patient would be best served with comfort care at a residential hospice, he expresses wishes not to pursue any further chemoradiation, aggressive treatment or dialysis.     I discussed this with the patient he is agreeable to this and wants to pursue hospice treatment. He was transferred to my care today, he already has a residential hospice bed where he will be discharged  with only comfort medications at this time. Doses is extremely poor condition is guarded expected to pass away soon.      Discharge Condition: guarded   Follow UP if desired with PCP      Discharge Instructions  and  Discharge Medications    Discharge Instructions   Discharge instructions    Complete by:  As directed   Disposition - [facility].   Condition guarded goal of care is comfort only   Activity as tolerated   Diet regular soft diet with feeding assistance for comfort, full aspiration precautions.  Disposition - [facility].   Condition guarded goal of care is comfort  only   Activity as tolerated   Diet regular soft diet with feeding assistance for comfort, full aspiration precautions.            Medication List    STOP taking these medications       BYSTOLIC 20 MG Tabs  Generic drug:  Nebivolol HCl     cloNIDine 0.2 MG tablet  Commonly known as:  CATAPRES     gabapentin 300 MG capsule  Commonly known as:  NEURONTIN     HYDROcodone-acetaminophen 10-325 MG per tablet  Commonly known as:  NORCO     omeprazole 40 MG capsule  Commonly known as:  PRILOSEC     pomalidomide 3 MG capsule  Commonly known as:  POMALYST     potassium chloride SA 20 MEQ tablet  Commonly known as:  K-DUR,KLOR-CON     PRESCRIPTION MEDICATION     warfarin 5 MG tablet  Commonly known as:  COUMADIN      TAKE these medications       albuterol (2.5 MG/3ML) 0.083% nebulizer solution  Commonly known as:  PROVENTIL  Take 3 mLs (2.5 mg total) by nebulization every 6 (six) hours as needed for wheezing or shortness of breath.     antiseptic oral rinse Liqd  15 mLs by Mouth Rinse route 2 (two) times daily.     chlorhexidine 0.12 % solution  Commonly known as:  PERIDEX  15 mLs by Mouth Rinse route 2 (two) times daily.     dexamethasone 4 MG tablet  Commonly known as:  DECADRON  Take 10 tablets (40 mg total) by mouth once a week. Mondays with chemo     docusate sodium 100 MG capsule  Commonly known as:  COLACE  Take 1 capsule (100 mg total) by mouth 2 (two) times daily as needed for mild constipation.     furosemide 20 MG tablet  Commonly known as:  LASIX  Take 69m (1 tablet) daily, alternating with 491m(2 tablets)  every other day.     lidocaine-prilocaine cream  Commonly known as:  EMLA  Apply 1 application topically as needed.     LORazepam 2 MG/ML concentrated solution  Commonly known as:  ATIVAN  Take 0.5 mLs (1 mg total) by mouth every 6 (six) hours as needed for anxiety.     morphine 60 MG 12 hr tablet  Commonly known as:  MS CONTIN  Take  1 tablet (60 mg total) by mouth every 12 (twelve) hours.     morphine CONCENTRATE 10 mg / 0.5 ml concentrated solution  Take 0.5 mLs (10 mg total) by mouth every 3 (three) hours as needed for moderate pain or severe pain.     prochlorperazine 10 MG tablet  Commonly known as:  COMPAZINE  Take 10 mg by mouth every  6 (six) hours as needed for nausea.          Diet and Activity recommendation: See Discharge Instructions above   Consults obtained - PCCM, Oncology, Pall Care   Major procedures and Radiology Reports - PLEASE review detailed and final reports for all details, in brief -       Dg Chest 2 View  05/17/2014   CLINICAL DATA:  ALTERED MENTAL STATUS ALTERED MENTAL STATUS  EXAM: CHEST  2 VIEW  COMPARISON:  Prior radiograph from 07/05/2013  FINDINGS: Right-sided Port-A-Cath in place with tip overlying the mid SVC. Cardiomegaly is grossly stable. Mediastinal silhouette within normal limits.  The lungs are normally inflated. No airspace consolidation or pulmonary edema is identified. Irregular pleural thickening along the lateral right lung related to history of multiple myeloma is grossly similar. There is no pneumothorax.  Changes of multiple myeloma with fracture and irregularity of the right posterior sixth rib is grossly stable from prior CT. No definite acute osseous abnormality.  IMPRESSION: 1. No active cardiopulmonary abnormality identified. 2. Similar changes related to multiple myeloma.   Electronically Signed   By: Jeannine Boga M.D.   On: 05/17/2014 14:00   Ct Head Wo Contrast  05/17/2014   CLINICAL DATA:  Altered mental status.  EXAM: CT HEAD WITHOUT CONTRAST  TECHNIQUE: Contiguous axial images were obtained from the base of the skull through the vertex without intravenous contrast.  COMPARISON:  Head CT scan 09/03/2012.  FINDINGS: Cortical atrophy is again seen. No evidence of acute abnormality including hemorrhage, infarct, mass lesion, mass effect, midline shift or  abnormal extra-axial fluid collection is identified. There is no hydrocephalus or pneumocephalus. The calvarium is intact. Tiny lucencies in the calvarium consistent with history of multiple myeloma are noted. Tiny mucous retention cyst or polyp left sphenoid sinus is seen. Sinus disease seen on the prior examination is markedly improved.  IMPRESSION: No acute abnormality.  Improved sinus disease.   Electronically Signed   By: Inge Rise M.D.   On: 05/17/2014 12:37   US Renal  05/17/2014   CLINICAL DATA:  Renal insufficiency. Multiple myeloma with chemotherapy ongoing.  EXAM: RENAL/URINARY TRACT ULTRASOUND COMPLETE  COMPARISON:  Renal ultrasound 02/03/2013.  FINDINGS: Examination is mildly limited by body habitus, bowel gas and patient condition.  Right Kidney:  Length: 13.0 cm. Echogenicity within normal limits. No mass or hydronephrosis visualized.  Left Kidney:  Length: 12.8 cm. Echogenicity within normal limits. No mass or hydronephrosis visualized.  Bladder:  Nearly empty. Ureteral jets could not be adequately evaluated due to patient condition.  IMPRESSION: Both kidneys are normal in size without hydronephrosis. No definite acute abnormalities identified with limitations as noted above.   Electronically Signed   By: Camie Patience M.D.   On: 05/17/2014 17:44   Dg Chest Port 1 View  05/18/2014   CLINICAL DATA:  Multiple myeloma and hypertension  EXAM: PORTABLE CHEST - 1 VIEW  COMPARISON:  May 17, 2014  FINDINGS: Port-A-Cath tip is in the superior vena cava, stable. No pneumothorax. There is no edema or consolidation. There is stable asymmetric pleural thickening in the right apex region. Heart is mildly enlarged with pulmonary vascularity within normal limits. There is no appreciable adenopathy. There is a stable fracture of the right sixth rib with lytic change in the right sixth and seventh ribs consistent with known multiple myeloma.  IMPRESSION: No edema or consolidation. No change in cardiac  silhouette. Bony changes indicative of multiple myeloma on the right, stable. Stable  asymmetric right apical and apicolateral pleural thickening.   Electronically Signed   By: Lowella Grip M.D.   On: 05/18/2014 07:24    Micro Results      Recent Results (from the past 240 hour(s))  TECHNOLOGIST REVIEW     Status: None   Collection Time    05/16/14  8:13 AM      Result Value Ref Range Status   Technologist Review Variant lymphs present, appear plasmacytoid   Final  URINE CULTURE     Status: None   Collection Time    05/17/14  1:43 PM      Result Value Ref Range Status   Specimen Description     Final   Value: URINE, RANDOM     Performed at [facility] Requests     Final   Value: NONE     Performed at [facility] Time     Final   Value: 05/18/2014 17:25     Performed at [place] Count     Final   Value: NO GROWTH     Performed at [facility]   Culture     Final   Value: NO GROWTH     Performed at [facility]   Report Status 05/19/2014 FINAL   Final  CULTURE, BLOOD (ROUTINE X 2)     Status: None   Collection Time    05/17/14  2:14 PM      Result Value Ref Range Status   Specimen Description BLOOD LEFT WRIST   Final   Special Requests BOTTLES DRAWN AEROBIC AND ANAEROBIC 6ML   Final   Culture  Setup Time     Final   Value: 05/17/2014 17:14     Performed at [facility]   Culture     Final   Value:        BLOOD CULTURE RECEIVED NO GROWTH TO DATE CULTURE WILL BE HELD FOR 5 DAYS BEFORE ISSUING A FINAL NEGATIVE REPORT     Performed at [facility]   Report Status PENDING   Incomplete  CULTURE, BLOOD (ROUTINE X 2)     Status: None   Collection Time    05/17/14  2:15 PM      Result Value Ref Range Status   Specimen Description BLOOD LEFT HAND   Final   Special Requests BOTTLES DRAWN AEROBIC AND ANAEROBIC 3ML   Final   Culture  Setup Time     Final    Value: 05/17/2014 17:14     Performed at [facility]   Culture     Final   Value:        BLOOD CULTURE RECEIVED NO GROWTH TO DATE CULTURE WILL BE HELD FOR 5 DAYS BEFORE ISSUING A FINAL NEGATIVE REPORT     Performed at [facility]   Report Status PENDING   Incomplete  MRSA PCR SCREENING     Status: Abnormal   Collection Time    05/17/14  7:17 PM      Result Value Ref Range Status   MRSA by PCR POSITIVE (*) NEGATIVE Final   Comment:            The GeneXpert MRSA Assay (FDA     approved for NASAL specimens     only), is one component of a     comprehensive MRSA colonization     surveillance  program. It is not     intended to diagnose MRSA     infection nor to guide or     monitor treatment for     MRSA infections.     RESULT CALLED TO, READ BACK BY AND VERIFIED WITH:     Bary Leriche RN [id number] 05/17/14 A BROWNING       Today   Subjective:   Silvio Pate today has no headache,no chest abdominal pain,no new weakness tingling or numbness.  Objective:   Blood pressure 121/65, pulse 92, temperature 98.6 F (37 C), temperature source Oral, resp. rate 16, height '5\' 4"'  (1.626 m), weight 97 kg (213 lb 13.5 oz), SpO2 93.00%.   Intake/Output Summary (Last 24 hours) at 05/20/14 0946 Last data filed at 05/20/14 0604  Gross per 24 hour  Intake    645 ml  Output    220 ml  Net    425 ml    Exam Somnolent but oriented x2, No new F.N deficits, Normal affect [place].AT,PERRAL Supple Neck,No JVD, No cervical lymphadenopathy appriciated.  Symmetrical Chest wall movement, Good air movement bilaterally, CTAB RRR,No Gallops,Rubs or new Murmurs, No Parasternal Heave +ve B.Sounds, Abd Soft, Non tender, No organomegaly appriciated, No rebound -guarding or rigidity. No Cyanosis, Clubbing or edema, No new Rash or bruise  Data Review   CBC w Diff: Lab Results  Component Value Date   WBC 4.8 05/19/2014   WBC 6.3 05/16/2014   HGB 7.6* 05/19/2014   HGB 9.9* 05/16/2014   HCT 23.8*  05/19/2014   HCT 32.7* 05/16/2014   PLT 135* 05/19/2014   PLT 184 05/16/2014   LYMPHOPCT 12 05/17/2014   LYMPHOPCT 22.9 05/16/2014   BANDSPCT 6 03/16/2013   MONOPCT 8 05/17/2014   MONOPCT 15.9* 05/16/2014   EOSPCT 0 05/17/2014   EOSPCT 1.0 05/16/2014   BASOPCT 1 05/17/2014   BASOPCT 2.4* 05/16/2014    CMP: Lab Results  Component Value Date   NA 149* 05/19/2014   NA 142 05/16/2014   K 3.5* 05/19/2014   K 3.5 05/16/2014   CL 97 05/19/2014   CL 112* 05/10/2013   CO2 29 05/19/2014   CO2 18* 05/16/2014   BUN 44* 05/19/2014   BUN 15.2 05/16/2014   CREATININE 5.79* 05/19/2014   CREATININE 2.9* 05/16/2014   PROT 6.3 05/17/2014   PROT 5.9* 05/16/2014   ALBUMIN 2.9* 05/19/2014   ALBUMIN 2.8* 05/16/2014   BILITOT 0.5 05/17/2014   BILITOT 0.96 05/16/2014   ALKPHOS 154* 05/17/2014   ALKPHOS 149 05/16/2014   AST 37 05/17/2014   AST 38* 05/16/2014   ALT 10 05/17/2014   ALT 11 05/16/2014  .   Total Time in preparing paper work, data evaluation and todays exam - 35 minutes  Thurnell Lose M.D on 05/20/2014 at 9:46 AM  [facility] Office  [phone]   **Disclaimer: This note may have been dictated with voice recognition software. Similar sounding words can inadvertently be transcribed and this note may contain transcription errors which may not have been corrected upon publication of note.**

## 2014-05-20 NOTE — Clinical Social Work Psychosocial (Signed)
Clinical Social Work Department BRIEF PSYCHOSOCIAL ASSESSMENT 05/20/2014  Patient:  [name], [name]     Account Number:  [id number]     Admit date:  05/17/2014  Clinical Social Worker:  Lovey Newcomer  Date/Time:  05/20/2014 10:42 AM  Referred by:  Physician  Date Referred:  05/20/2014 Referred for  Residential hospice placement   Other Referral:   Interview type:  Family Other interview type:   Patient's wife interviewed to complete assessment.    PSYCHOSOCIAL DATA Living Status:  WIFE Admitted from facility:   Level of care:   Primary support name:  Otila Kluver Primary support relationship to patient:  SPOUSE Degree of support available:   Support is strong.    CURRENT CONCERNS Current Concerns  Post-Acute Placement   Other Concerns:    SOCIAL WORK ASSESSMENT / PLAN CSW met with patient's family at bedside. Patient's famimly has chosen comfort care for their loved one and has requested bed at [facility]. Per [facility], facility is able to offer patient a bed today. Family is noticeably very supportive of patient and seems to have accepted his current condition. CSW will assist with Dc when appropriate.   Assessment/plan status:  Psychosocial Support/Ongoing Assessment of Needs Other assessment/ plan:   Make hospice referrals   Information/referral to community resources:   CSW contact info given.    PATIENT'S/FAMILY'S RESPONSE TO PLAN OF CARE: Patient's family plans for patient to DC to [facility]. CSW will assist.

## 2014-05-23 ENCOUNTER — Other Ambulatory Visit: Payer: 59

## 2014-05-23 ENCOUNTER — Ambulatory Visit: Payer: 59

## 2014-05-23 ENCOUNTER — Ambulatory Visit: Payer: 59 | Admitting: Physician Assistant

## 2014-05-23 ENCOUNTER — Encounter: Payer: Self-pay | Admitting: Radiation Oncology

## 2014-05-23 LAB — CULTURE, BLOOD (ROUTINE X 2)
CULTURE: NO GROWTH
Culture: NO GROWTH

## 2014-05-23 NOTE — Progress Notes (Signed)
  Radiation Oncology         [phone] ________________________________  Name: [name] MRN: [id number]  Date: 05/23/2014  DOB: 23-Sep-1961  End of Treatment Note  Diagnosis:   Recurrent multiple myeloma     Indication for treatment:  Pain in the lumbosacral spine and associated right leg weakness       Radiation treatment dates:   June 1 through June 10  Site/dose:   L1 through S2 spine,  20 gray in 8 fractions  Beams:   AP, PA  Narrative: The patient tolerated radiation treatment relatively well.   He had improvement in his pain is well as well as his ability to ambulate  Plan: The patient has completed radiation treatment. The patient will return to radiation oncology clinic for routine followup in one month. I advised them to call or return sooner if they have any questions or concerns related to their recovery or treatment.  -----------------------------------  Blair Promise, PhD, MD

## 2014-05-24 ENCOUNTER — Ambulatory Visit: Payer: 59

## 2014-05-30 ENCOUNTER — Other Ambulatory Visit: Payer: 59

## 2014-05-30 ENCOUNTER — Ambulatory Visit: Payer: 59

## 2014-05-31 ENCOUNTER — Ambulatory Visit: Payer: 59

## 2014-06-06 ENCOUNTER — Ambulatory Visit: Payer: 59

## 2014-06-07 ENCOUNTER — Ambulatory Visit: Payer: 59

## 2014-06-18 DEATH — deceased

## 2014-07-13 ENCOUNTER — Other Ambulatory Visit: Payer: Self-pay | Admitting: Pharmacist

## 2014-07-14 NOTE — Telephone Encounter (Signed)
Encounter was telephone call.
